# Patient Record
Sex: Female | Born: 1945 | Race: White | Hispanic: No | Marital: Married | Smoking: Former smoker
Health system: Southern US, Community
[De-identification: ages and names within clinical notes are randomized; demographics above are authoritative.]

## PROBLEM LIST (undated history)

## (undated) DIAGNOSIS — G20A1 Parkinson's disease without dyskinesia, without mention of fluctuations: Secondary | ICD-10-CM

## (undated) DIAGNOSIS — R1310 Dysphagia, unspecified: Secondary | ICD-10-CM

## (undated) DIAGNOSIS — G20 Parkinson's disease: Secondary | ICD-10-CM

## (undated) DIAGNOSIS — S92909A Unspecified fracture of unspecified foot, initial encounter for closed fracture: Secondary | ICD-10-CM

## (undated) DIAGNOSIS — T56891A Toxic effect of other metals, accidental (unintentional), initial encounter: Secondary | ICD-10-CM

## (undated) DIAGNOSIS — E0590 Thyrotoxicosis, unspecified without thyrotoxic crisis or storm: Secondary | ICD-10-CM

## (undated) DIAGNOSIS — N289 Disorder of kidney and ureter, unspecified: Secondary | ICD-10-CM

## (undated) DIAGNOSIS — M1990 Unspecified osteoarthritis, unspecified site: Secondary | ICD-10-CM

## (undated) DIAGNOSIS — K859 Acute pancreatitis without necrosis or infection, unspecified: Secondary | ICD-10-CM

## (undated) DIAGNOSIS — R296 Repeated falls: Secondary | ICD-10-CM

## (undated) DIAGNOSIS — R42 Dizziness and giddiness: Secondary | ICD-10-CM

## (undated) DIAGNOSIS — D649 Anemia, unspecified: Secondary | ICD-10-CM

## (undated) DIAGNOSIS — M436 Torticollis: Secondary | ICD-10-CM

## (undated) DIAGNOSIS — J449 Chronic obstructive pulmonary disease, unspecified: Secondary | ICD-10-CM

## (undated) DIAGNOSIS — R270 Ataxia, unspecified: Secondary | ICD-10-CM

## (undated) DIAGNOSIS — C50412 Malignant neoplasm of upper-outer quadrant of left female breast: Secondary | ICD-10-CM

## (undated) DIAGNOSIS — K2950 Unspecified chronic gastritis without bleeding: Secondary | ICD-10-CM

## (undated) DIAGNOSIS — F319 Bipolar disorder, unspecified: Secondary | ICD-10-CM

## (undated) DIAGNOSIS — K259 Gastric ulcer, unspecified as acute or chronic, without hemorrhage or perforation: Secondary | ICD-10-CM

## (undated) DIAGNOSIS — R413 Other amnesia: Secondary | ICD-10-CM

## (undated) DIAGNOSIS — D352 Benign neoplasm of pituitary gland: Secondary | ICD-10-CM

## (undated) DIAGNOSIS — C50419 Malignant neoplasm of upper-outer quadrant of unspecified female breast: Secondary | ICD-10-CM

## (undated) DIAGNOSIS — I10 Essential (primary) hypertension: Secondary | ICD-10-CM

## (undated) DIAGNOSIS — E785 Hyperlipidemia, unspecified: Secondary | ICD-10-CM

## (undated) DIAGNOSIS — Z853 Personal history of malignant neoplasm of breast: Principal | ICD-10-CM

## (undated) DIAGNOSIS — N189 Chronic kidney disease, unspecified: Secondary | ICD-10-CM

## (undated) DIAGNOSIS — N390 Urinary tract infection, site not specified: Secondary | ICD-10-CM

## (undated) DIAGNOSIS — H409 Unspecified glaucoma: Secondary | ICD-10-CM

## (undated) HISTORY — DX: Ataxia, unspecified: R27.0

## (undated) HISTORY — DX: Unspecified glaucoma: H40.9

## (undated) HISTORY — DX: Acute pancreatitis without necrosis or infection, unspecified: K85.90

## (undated) HISTORY — DX: Toxic effect of other metals, accidental (unintentional), initial encounter: T56.891A

## (undated) HISTORY — DX: Bipolar disorder, unspecified: F31.9

## (undated) HISTORY — PX: TUBAL LIGATION: SHX77

## (undated) HISTORY — DX: Chronic kidney disease, unspecified: N18.9

## (undated) HISTORY — PX: OTHER SURGICAL HISTORY: SHX169

## (undated) HISTORY — DX: Malignant neoplasm of upper-outer quadrant of unspecified female breast: C50.419

## (undated) HISTORY — PX: ERCP: SHX60

## (undated) HISTORY — DX: Parkinson's disease without dyskinesia, without mention of fluctuations: G20.A1

## (undated) HISTORY — DX: Personal history of malignant neoplasm of breast: Z85.3

## (undated) HISTORY — DX: Chronic obstructive pulmonary disease, unspecified: J44.9

## (undated) HISTORY — DX: Essential (primary) hypertension: I10

## (undated) HISTORY — DX: Unspecified fracture of unspecified foot, initial encounter for closed fracture: S92.909A

## (undated) HISTORY — PX: EUS: SHX5427

## (undated) HISTORY — PX: APPENDECTOMY: SHX54

## (undated) HISTORY — DX: Benign neoplasm of pituitary gland: D35.2

## (undated) HISTORY — DX: Parkinson's disease: G20

## (undated) HISTORY — DX: Unspecified osteoarthritis, unspecified site: M19.90

## (undated) HISTORY — PX: COLON SURGERY: SHX602

---
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
## 1999-11-06 HISTORY — PX: BREAST SURGERY: SHX581

## 1999-12-23 HISTORY — PX: BREAST EXCISIONAL BIOPSY: SUR124

## 2002-12-22 HISTORY — PX: COLONOSCOPY: SHX174

## 2004-10-28 ENCOUNTER — Ambulatory Visit: Payer: Self-pay | Admitting: Pain Medicine

## 2004-11-07 ENCOUNTER — Ambulatory Visit: Payer: Self-pay | Admitting: Pain Medicine

## 2004-11-18 ENCOUNTER — Ambulatory Visit: Payer: Self-pay | Admitting: Unknown Physician Specialty

## 2004-11-20 ENCOUNTER — Ambulatory Visit: Payer: Self-pay | Admitting: Pain Medicine

## 2004-12-22 HISTORY — PX: NECK SURGERY: SHX720

## 2005-03-20 ENCOUNTER — Ambulatory Visit: Payer: Self-pay | Admitting: Pain Medicine

## 2005-08-18 ENCOUNTER — Ambulatory Visit: Payer: Self-pay | Admitting: Pain Medicine

## 2005-08-29 ENCOUNTER — Ambulatory Visit: Payer: Self-pay | Admitting: Pain Medicine

## 2005-09-02 ENCOUNTER — Ambulatory Visit: Payer: Self-pay | Admitting: Pain Medicine

## 2005-09-08 ENCOUNTER — Ambulatory Visit: Payer: Self-pay | Admitting: Pain Medicine

## 2005-09-19 ENCOUNTER — Ambulatory Visit: Payer: Self-pay | Admitting: Pain Medicine

## 2005-10-06 ENCOUNTER — Ambulatory Visit: Payer: Self-pay | Admitting: Pain Medicine

## 2005-10-13 ENCOUNTER — Ambulatory Visit: Payer: Self-pay | Admitting: Pain Medicine

## 2005-10-15 ENCOUNTER — Ambulatory Visit: Payer: Self-pay | Admitting: Pain Medicine

## 2006-03-12 ENCOUNTER — Ambulatory Visit: Payer: Self-pay | Admitting: Internal Medicine

## 2006-03-12 ENCOUNTER — Ambulatory Visit: Payer: Self-pay

## 2006-03-12 ENCOUNTER — Ambulatory Visit: Payer: Self-pay | Admitting: Unknown Physician Specialty

## 2006-03-30 ENCOUNTER — Ambulatory Visit: Payer: Self-pay | Admitting: Internal Medicine

## 2006-03-31 ENCOUNTER — Ambulatory Visit: Payer: Self-pay | Admitting: Internal Medicine

## 2006-12-07 ENCOUNTER — Ambulatory Visit: Payer: Self-pay | Admitting: Pain Medicine

## 2006-12-22 HISTORY — PX: BREAST EXCISIONAL BIOPSY: SUR124

## 2007-01-06 ENCOUNTER — Ambulatory Visit: Payer: Self-pay | Admitting: Internal Medicine

## 2007-01-06 ENCOUNTER — Ambulatory Visit: Payer: Self-pay | Admitting: Unknown Physician Specialty

## 2007-04-15 ENCOUNTER — Ambulatory Visit: Payer: Self-pay | Admitting: Internal Medicine

## 2007-04-19 ENCOUNTER — Ambulatory Visit: Payer: Self-pay | Admitting: Internal Medicine

## 2007-04-29 ENCOUNTER — Other Ambulatory Visit: Payer: Self-pay

## 2007-04-29 ENCOUNTER — Ambulatory Visit: Payer: Self-pay | Admitting: General Surgery

## 2007-05-07 ENCOUNTER — Ambulatory Visit: Payer: Self-pay | Admitting: General Surgery

## 2007-05-07 DIAGNOSIS — C50419 Malignant neoplasm of upper-outer quadrant of unspecified female breast: Secondary | ICD-10-CM

## 2007-05-07 HISTORY — DX: Malignant neoplasm of upper-outer quadrant of unspecified female breast: C50.419

## 2007-05-07 HISTORY — PX: BREAST SURGERY: SHX581

## 2007-05-23 ENCOUNTER — Ambulatory Visit: Payer: Self-pay | Admitting: Radiation Oncology

## 2007-05-27 ENCOUNTER — Ambulatory Visit: Payer: Self-pay | Admitting: Radiation Oncology

## 2007-06-22 ENCOUNTER — Ambulatory Visit: Payer: Self-pay | Admitting: Radiation Oncology

## 2007-07-23 ENCOUNTER — Ambulatory Visit: Payer: Self-pay | Admitting: Radiation Oncology

## 2007-08-23 ENCOUNTER — Ambulatory Visit: Payer: Self-pay | Admitting: Radiation Oncology

## 2007-09-22 ENCOUNTER — Ambulatory Visit: Payer: Self-pay | Admitting: Radiation Oncology

## 2007-10-23 ENCOUNTER — Ambulatory Visit: Payer: Self-pay | Admitting: Radiation Oncology

## 2007-11-22 ENCOUNTER — Ambulatory Visit: Payer: Self-pay | Admitting: Radiation Oncology

## 2008-03-22 ENCOUNTER — Ambulatory Visit: Payer: Self-pay | Admitting: General Surgery

## 2008-03-22 ENCOUNTER — Ambulatory Visit: Payer: Self-pay | Admitting: Radiation Oncology

## 2008-04-12 ENCOUNTER — Ambulatory Visit: Payer: Self-pay | Admitting: Radiation Oncology

## 2008-04-21 ENCOUNTER — Ambulatory Visit: Payer: Self-pay | Admitting: Radiation Oncology

## 2008-05-31 DIAGNOSIS — C50411 Malignant neoplasm of upper-outer quadrant of right female breast: Secondary | ICD-10-CM | POA: Insufficient documentation

## 2008-09-21 ENCOUNTER — Ambulatory Visit: Payer: Self-pay | Admitting: Radiation Oncology

## 2008-10-02 ENCOUNTER — Ambulatory Visit: Payer: Self-pay | Admitting: General Surgery

## 2008-10-12 ENCOUNTER — Ambulatory Visit: Payer: Self-pay | Admitting: Radiation Oncology

## 2008-10-22 ENCOUNTER — Ambulatory Visit: Payer: Self-pay | Admitting: Radiation Oncology

## 2009-04-02 ENCOUNTER — Ambulatory Visit: Payer: Self-pay | Admitting: General Surgery

## 2009-09-21 ENCOUNTER — Ambulatory Visit: Payer: Self-pay | Admitting: Radiation Oncology

## 2009-10-12 ENCOUNTER — Ambulatory Visit: Payer: Self-pay | Admitting: Radiation Oncology

## 2009-10-22 ENCOUNTER — Ambulatory Visit: Payer: Self-pay | Admitting: Radiation Oncology

## 2009-11-05 ENCOUNTER — Ambulatory Visit: Payer: Self-pay | Admitting: General Surgery

## 2009-12-22 HISTORY — PX: BREAST BIOPSY: SHX20

## 2010-02-05 ENCOUNTER — Ambulatory Visit: Payer: Self-pay | Admitting: Internal Medicine

## 2010-02-11 ENCOUNTER — Ambulatory Visit: Payer: Self-pay | Admitting: Gastroenterology

## 2010-02-12 ENCOUNTER — Ambulatory Visit: Payer: Self-pay | Admitting: Gastroenterology

## 2010-02-18 ENCOUNTER — Ambulatory Visit: Payer: Self-pay | Admitting: Gastroenterology

## 2010-05-02 ENCOUNTER — Ambulatory Visit: Payer: Self-pay | Admitting: General Surgery

## 2010-05-07 ENCOUNTER — Ambulatory Visit: Payer: Self-pay | Admitting: General Surgery

## 2010-10-18 ENCOUNTER — Other Ambulatory Visit: Payer: Self-pay | Admitting: Unknown Physician Specialty

## 2010-10-30 ENCOUNTER — Ambulatory Visit: Payer: Self-pay | Admitting: General Surgery

## 2011-05-12 ENCOUNTER — Ambulatory Visit: Payer: Self-pay | Admitting: General Surgery

## 2011-09-19 ENCOUNTER — Other Ambulatory Visit: Payer: Self-pay | Admitting: Unknown Physician Specialty

## 2011-12-23 HISTORY — PX: EYE SURGERY: SHX253

## 2011-12-23 HISTORY — PX: BREAST SURGERY: SHX581

## 2011-12-23 HISTORY — PX: GLAUCOMA SURGERY: SHX656

## 2011-12-23 HISTORY — PX: OTHER SURGICAL HISTORY: SHX169

## 2012-01-14 ENCOUNTER — Ambulatory Visit: Payer: Self-pay | Admitting: Ophthalmology

## 2012-05-13 ENCOUNTER — Ambulatory Visit: Payer: Self-pay | Admitting: General Surgery

## 2012-05-14 ENCOUNTER — Ambulatory Visit: Payer: Self-pay | Admitting: General Surgery

## 2012-09-28 ENCOUNTER — Ambulatory Visit: Payer: Self-pay | Admitting: General Surgery

## 2012-10-07 ENCOUNTER — Other Ambulatory Visit: Payer: Self-pay | Admitting: Unknown Physician Specialty

## 2012-10-07 LAB — CREATININE, SERUM
Creatinine: 1.11 mg/dL (ref 0.60–1.30)
EGFR (Non-African Amer.): 52 — ABNORMAL LOW

## 2012-10-07 LAB — TSH: Thyroid Stimulating Horm: 2.8 u[IU]/mL

## 2012-10-07 LAB — LITHIUM LEVEL: Lithium: 0.9 mmol/L

## 2012-10-26 ENCOUNTER — Ambulatory Visit: Payer: Self-pay | Admitting: General Surgery

## 2012-10-26 HISTORY — PX: BREAST EXCISIONAL BIOPSY: SUR124

## 2013-03-17 ENCOUNTER — Encounter: Payer: Self-pay | Admitting: General Surgery

## 2013-05-17 ENCOUNTER — Ambulatory Visit: Payer: Self-pay | Admitting: General Surgery

## 2013-05-23 ENCOUNTER — Encounter: Payer: Self-pay | Admitting: General Surgery

## 2013-05-31 ENCOUNTER — Ambulatory Visit (INDEPENDENT_AMBULATORY_CARE_PROVIDER_SITE_OTHER): Payer: Medicare PPO | Admitting: General Surgery

## 2013-05-31 ENCOUNTER — Encounter: Payer: Self-pay | Admitting: General Surgery

## 2013-05-31 VITALS — BP 122/68 | HR 74 | Resp 12 | Ht 64.0 in | Wt 103.0 lb

## 2013-05-31 DIAGNOSIS — Z853 Personal history of malignant neoplasm of breast: Secondary | ICD-10-CM

## 2013-05-31 DIAGNOSIS — C50419 Malignant neoplasm of upper-outer quadrant of unspecified female breast: Secondary | ICD-10-CM

## 2013-05-31 DIAGNOSIS — C50411 Malignant neoplasm of upper-outer quadrant of right female breast: Secondary | ICD-10-CM

## 2013-05-31 NOTE — Patient Instructions (Addendum)
Patient to return in one yaer.

## 2013-05-31 NOTE — Progress Notes (Signed)
Patient ID: Elaine Clayton, female   DOB: 1946-09-07, 67 y.o.   MRN: [id number]  Chief Complaint  Patient presents with  . Other    mammogram    HPI Elaine Clayton is a 67 y.o. female here today for her follow up mammogram done at [facility] on 05/18/13 cat 2. Patient reports no breast problems and that she is doing well.  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 Patient underwent excision of a left breast papilloma in 2013.   HPI  Past Medical History  Diagnosis Date  . Glaucoma   . Hypertension   . Bipolar affective disorder   . Pancreatitis   . Pituitary microadenoma   . COPD (chronic obstructive pulmonary disease)   . Personal history of malignant neoplasm of breast   . Osteoarthritis     Past Surgical History  Procedure Laterality Date  . Colonoscopy  2004  . Neck surgery  2006  . Eye surgery  2013  . Appendectomy    . Tubal ligation    . Breast surgery Left 2013    left breast wide excision,Intraductal papilloma, ductal hyperplasia and sclerosing adenosis. Microcalcifications associated with columnar cell change. No evidence of atypia or malignancy. Margins are unremarkable.    Family History  Problem Relation Age of Onset  . Breast cancer Mother     Social History History  Substance Use Topics  . Smoking status: Former Smoker -- 1.00 packs/day for 35 years  . Smokeless tobacco: Never Used  . Alcohol Use: Yes    Allergies  Allergen Reactions  . Sulfa Antibiotics Rash  . Tetracyclines & Related Rash    Current Outpatient Prescriptions  Medication Sig Dispense Refill  . ALPRAZolam (XANAX) 0.5 MG tablet Take 0.5 mg by mouth at bedtime as needed for sleep.      Marland Kitchen buPROPion (WELLBUTRIN SR) 150 MG 12 hr tablet Take 150 mg by mouth 2 (two) times daily.      Marland Kitchen esomeprazole (NEXIUM) 40 MG capsule Take 40 mg by mouth daily before breakfast.      . lithium 300 MG tablet Take 300 mg by  mouth daily.      . metoCLOPramide (REGLAN) 5 MG tablet Take 5 mg by mouth 4 (four) times daily.      . promethazine (PHENERGAN) 25 MG tablet Take 25 mg by mouth every 6 (six) hours as needed for nausea.      . rosuvastatin (CRESTOR) 10 MG tablet Take 10 mg by mouth daily.       No current facility-administered medications for this visit.    Review of Systems Review of Systems  Constitutional: Negative.   Respiratory: Negative.   Cardiovascular: Negative.     Blood pressure 122/68, pulse 74, resp. rate 12, height 5\' 4"  (1.626 m), weight 103 lb (46.72 kg).  Physical Exam Physical Exam  Constitutional: She appears well-developed and well-nourished.  Eyes: Conjunctivae are normal. No scleral icterus.  Neck: Neck supple.  Cardiovascular: Normal rate, regular rhythm and normal heart sounds.   Pulmonary/Chest: Breath sounds normal. Right breast exhibits no inverted nipple, no mass, no nipple discharge, no skin change and no tenderness. Left breast exhibits no inverted nipple, no mass, no nipple discharge, no skin change and no tenderness.  Lymphadenopathy:    She has no cervical adenopathy.    She has no axillary adenopathy.    Data Reviewed Bilateral mammograms dated May 17, 2013 showed no  interval change.  BI-RAD-2.  Assessment    The patient is doing well.     Plan    Arrangements were made for followup examination with bilateral mammograms one year.        [name] 05/31/2013, 10:21 PM

## 2013-10-04 ENCOUNTER — Encounter: Payer: Self-pay | Admitting: Unknown Physician Specialty

## 2013-10-22 ENCOUNTER — Encounter: Payer: Self-pay | Admitting: Unknown Physician Specialty

## 2013-11-21 ENCOUNTER — Encounter: Payer: Self-pay | Admitting: Unknown Physician Specialty

## 2013-12-22 DIAGNOSIS — T56891A Toxic effect of other metals, accidental (unintentional), initial encounter: Secondary | ICD-10-CM

## 2013-12-22 DIAGNOSIS — S92909A Unspecified fracture of unspecified foot, initial encounter for closed fracture: Secondary | ICD-10-CM

## 2013-12-22 HISTORY — DX: Unspecified fracture of unspecified foot, initial encounter for closed fracture: S92.909A

## 2013-12-22 HISTORY — DX: Toxic effect of other metals, accidental (unintentional), initial encounter: T56.891A

## 2014-05-17 DIAGNOSIS — J449 Chronic obstructive pulmonary disease, unspecified: Secondary | ICD-10-CM | POA: Insufficient documentation

## 2014-06-20 ENCOUNTER — Ambulatory Visit: Payer: Self-pay | Admitting: General Surgery

## 2014-06-20 ENCOUNTER — Encounter: Payer: Self-pay | Admitting: General Surgery

## 2014-06-28 ENCOUNTER — Ambulatory Visit: Payer: Medicare PPO | Admitting: General Surgery

## 2014-06-28 DIAGNOSIS — N289 Disorder of kidney and ureter, unspecified: Secondary | ICD-10-CM | POA: Insufficient documentation

## 2014-06-28 DIAGNOSIS — F319 Bipolar disorder, unspecified: Secondary | ICD-10-CM | POA: Insufficient documentation

## 2014-06-28 DIAGNOSIS — E785 Hyperlipidemia, unspecified: Secondary | ICD-10-CM | POA: Insufficient documentation

## 2014-06-28 DIAGNOSIS — C50919 Malignant neoplasm of unspecified site of unspecified female breast: Secondary | ICD-10-CM | POA: Insufficient documentation

## 2014-06-28 DIAGNOSIS — M1990 Unspecified osteoarthritis, unspecified site: Secondary | ICD-10-CM | POA: Insufficient documentation

## 2014-06-28 DIAGNOSIS — K861 Other chronic pancreatitis: Secondary | ICD-10-CM | POA: Insufficient documentation

## 2014-06-28 DIAGNOSIS — I10 Essential (primary) hypertension: Secondary | ICD-10-CM | POA: Insufficient documentation

## 2014-06-28 DIAGNOSIS — M5030 Other cervical disc degeneration, unspecified cervical region: Secondary | ICD-10-CM | POA: Insufficient documentation

## 2014-06-30 ENCOUNTER — Encounter: Payer: Self-pay | Admitting: General Surgery

## 2014-07-03 ENCOUNTER — Ambulatory Visit: Payer: Medicare PPO | Admitting: General Surgery

## 2014-07-18 ENCOUNTER — Encounter: Payer: Self-pay | Admitting: General Surgery

## 2014-07-18 ENCOUNTER — Ambulatory Visit (INDEPENDENT_AMBULATORY_CARE_PROVIDER_SITE_OTHER): Payer: Medicare PPO | Admitting: General Surgery

## 2014-07-18 VITALS — BP 110/70 | HR 80 | Resp 12 | Ht 64.0 in | Wt 96.0 lb

## 2014-07-18 DIAGNOSIS — Z853 Personal history of malignant neoplasm of breast: Secondary | ICD-10-CM

## 2014-07-18 NOTE — Patient Instructions (Signed)
Patient to return in 1 year with bilateral screening mammogram. Continue self breast exams. Call office for any new breast issues or concerns.  

## 2014-07-18 NOTE — Progress Notes (Signed)
Patient ID: Elaine Clayton, female   DOB: 1946/10/28, 68 y.o.   MRN: [id number]  Chief Complaint  Patient presents with  . Follow-up    mammogram     HPI Elaine Clayton is a 68 y.o. female who presents for a breast evaluation. The most recent mammogram was done on 06/20/14. Patient does perform regular self breast checks and gets regular mammograms done. The patient denies any new problems at this time.    HPI  Past Medical History  Diagnosis Date  . Glaucoma   . Hypertension   . Bipolar affective disorder   . Pancreatitis   . Pituitary microadenoma   . COPD (chronic obstructive pulmonary disease)   . Personal history of malignant neoplasm of breast   . Osteoarthritis   . Broken foot 2015    right foot  . Lithium toxicity 2015  . 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 Colonoscopy  2004  . Neck surgery  2006  . Eye surgery  2013  . Appendectomy    . Tubal ligation    . 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  . Breast surgery Left  2013    left breast wide excision,Intraductal papilloma, ductal hyperplasia and sclerosing adenosis. Microcalcifications associated with columnar cell change. No evidence of atypia or malignancy. Margins are unremarkable.  . Breast surgery Right November 06, 1999    multiple areas of microcalcification showing evidence of sclerosing adenosis and ductal hyperplasia.  . Breast surgery Left May 07, 2007    wide excision.    Family History  Problem Relation Age of Onset  . Breast cancer Mother     Social History History  Substance Use Topics  . Smoking status: Former Smoker -- 1.00 packs/day for 35 years  . Smokeless tobacco: Never Used  . Alcohol Use: Yes    Allergies  Allergen Reactions  . Sulfa Antibiotics Rash  . Tetracyclines & Related Rash    Current Outpatient  Prescriptions  Medication Sig Dispense Refill  . ALPRAZolam (XANAX) 0.5 MG tablet Take 0.5 mg by mouth at bedtime as needed for sleep.      Marland Kitchen amLODipine (NORVASC) 5 MG tablet Take 5 mg by mouth daily.      . bimatoprost (LUMIGAN) 0.03 % ophthalmic solution Place 1 drop into both eyes at bedtime.      . Brimonidine Tartrate-Timolol (COMBIGAN OP) Apply 1 drop to eye 2 (two) times daily.      . brinzolamide (AZOPT) 1 % ophthalmic suspension Place 1 drop into the left eye at bedtime.      Marland Kitchen buPROPion (WELLBUTRIN SR) 150 MG 12 hr tablet Take 150 mg by mouth 2 (two) times daily.      . Calcium Carbonate-Vitamin D (CALCIUM + D PO) Take 1 tablet by mouth 3 (three) times daily.      . cloNIDine (CATAPRES) 0.1 MG tablet Take 0.1 mg by mouth 2 (two) times daily.      . Coenzyme Q10 (CO Q 10 PO) Take 1 tablet by mouth daily.      . divalproex (DEPAKOTE) 250 MG DR tablet Take 250 mg by mouth 2 (two) times daily.      Marland Kitchen HYDROcodone-acetaminophen (NORCO/VICODIN) 5-325 MG per tablet Take 1 tablet by mouth every 6 (six) hours as needed for  moderate pain.      . Omega-3 Fatty Acids (OMEGA 3 PO) Take 1 capsule by mouth daily.      . ondansetron (ZOFRAN) 4 MG tablet Take 4 mg by mouth every 8 (eight) hours as needed for nausea or vomiting.      . rosuvastatin (CRESTOR) 10 MG tablet Take 10 mg by mouth daily.       No current facility-administered medications for this visit.    Review of Systems Review of Systems  Constitutional: Negative.   Respiratory: Negative.   Cardiovascular: Negative.     Blood pressure 110/70, pulse 80, resp. rate 12, height 5\' 4"  (1.626 m), weight 96 lb (43.545 kg).  Physical Exam Physical Exam  Constitutional: She is oriented to person, place, and time. She appears well-developed and well-nourished.  Neck: Neck supple. No thyromegaly present.  Cardiovascular: Normal rate, regular rhythm and normal heart sounds.   No murmur heard. Pulmonary/Chest: Effort normal and breath  sounds normal. Right breast exhibits tenderness (upper outer quadrant at site of scar). Right breast exhibits no inverted nipple, no mass, no nipple discharge and no skin change. Left breast exhibits no inverted nipple, no mass, no nipple discharge, no skin change and no tenderness.    Lymphadenopathy:    She has no cervical adenopathy.    She has no axillary adenopathy.  Neurological: She is alert and oriented to person, place, and time.  Skin: Skin is warm and dry.    Data Reviewed Bilateral mammogram stated June 20, 2014 were independently reviewed. Scattered calcifications.  No interval change. BI-RAD-2.  Assessment    Benign breast exam now 7 years out from a very small invasive carcinoma.     Plan    Follow up examination with screening mammograms in one year.     PCP: Damaris Hippo 07/18/2014, 9:02 PM

## 2014-07-28 ENCOUNTER — Encounter: Payer: Self-pay | Admitting: General Surgery

## 2014-09-08 ENCOUNTER — Emergency Department: Payer: Self-pay | Admitting: Emergency Medicine

## 2014-09-08 LAB — COMPREHENSIVE METABOLIC PANEL
ALK PHOS: 81 U/L
Albumin: 3.8 g/dL (ref 3.4–5.0)
Anion Gap: 9 (ref 7–16)
BILIRUBIN TOTAL: 0.4 mg/dL (ref 0.2–1.0)
BUN: 23 mg/dL — AB (ref 7–18)
CALCIUM: 9.6 mg/dL (ref 8.5–10.1)
CO2: 27 mmol/L (ref 21–32)
Chloride: 105 mmol/L (ref 98–107)
Creatinine: 1.06 mg/dL (ref 0.60–1.30)
EGFR (Non-African Amer.): 55 — ABNORMAL LOW
GLUCOSE: 108 mg/dL — AB (ref 65–99)
OSMOLALITY: 285 (ref 275–301)
POTASSIUM: 4.2 mmol/L (ref 3.5–5.1)
SGOT(AST): 28 U/L (ref 15–37)
SGPT (ALT): 26 U/L
Sodium: 141 mmol/L (ref 136–145)
TOTAL PROTEIN: 7.4 g/dL (ref 6.4–8.2)

## 2014-09-08 LAB — CBC
HCT: 37 % (ref 35.0–47.0)
HGB: 12.5 g/dL (ref 12.0–16.0)
MCH: 32 pg (ref 26.0–34.0)
MCHC: 33.7 g/dL (ref 32.0–36.0)
MCV: 95 fL (ref 80–100)
Platelet: 167 10*3/uL (ref 150–440)
RBC: 3.9 10*6/uL (ref 3.80–5.20)
RDW: 13 % (ref 11.5–14.5)
WBC: 5.6 10*3/uL (ref 3.6–11.0)

## 2015-01-05 ENCOUNTER — Ambulatory Visit: Payer: Self-pay | Admitting: Internal Medicine

## 2015-04-10 NOTE — Op Note (Signed)
PATIENT NAME:  Elaine Clayton, Elaine Clayton MR#:  [id number] DATE OF BIRTH:  Jun 14, 1946  DATE OF PROCEDURE:  10/26/2012  PREOPERATIVE DIAGNOSIS: Abnormal left breast mammogram.   POSTOPERATIVE DIAGNOSIS: Abnormal left breast mammogram.   OPERATIVE PROCEDURE: Needle localization and biopsy of the left breast.   SURGEON: Hervey Ard, MD   ANESTHESIA: General by LMA, Marcaine 0.5% plain, 30 mL local infiltration.   ESTIMATED BLOOD LOSS: 5 mL.   CLINICAL NOTE: This 69 year old woman has a new cluster of microcalcifications in the left breast. She underwent needle localization by David Martinique, MD in Radiology. She is brought to the Operating Room for planned excision.   OPERATIVE NOTE: With the patient under general anesthesia, the area was prepped with Betadine solution and draped. Ultrasound was used to identify the tip of the needle where the calcifications were identified posterior to the tip. A curvilinear incision in the upper inner quadrant of the breast was made and carried down through the skin and subcutaneous tissue after the injection of local anesthetic. Hemostasis was with electrocautery. The thin layer of subcutaneous fat was divided and the needle-localizing wire brought into the field. A 3 cm diameter area was excised down and including part of the pectoralis fascia. Specimen radiograph was reported to show the calcifications. The breast tissue was mobilized off the underlying pectoralis fascia and approximated with interrupted 2-0 Vicryl figure-of-eight sutures. The adipose layer was treated in a similar fashion. The skin was closed with running 4-0 Vicryl subcuticular suture. Benzoin and Steri-Strips were applied followed by a Telfa pad. Fluff gauze, Kerlix, and Ace wrap was then applied. The patient tolerated the procedure well and was taken to the recovery room in stable condition.  ____________________________ Robert Bellow, MD jwb:cbb D: 10/26/2012 15:49:13 ET T: 10/26/2012  17:52:29 ET JOB#: [id number] cc: Leonie Douglas. Doy Hutching, MD Tyan Lasure Amedeo Kinsman MD ELECTRONICALLY SIGNED 10/28/2012 9:34

## 2015-05-14 ENCOUNTER — Encounter: Payer: Self-pay | Admitting: *Deleted

## 2015-05-14 ENCOUNTER — Ambulatory Visit: Payer: Medicare PPO | Admitting: Anesthesiology

## 2015-05-14 ENCOUNTER — Ambulatory Visit
Admission: RE | Admit: 2015-05-14 | Discharge: 2015-05-14 | Disposition: A | Discharge status: Home / Self Care | Payer: Medicare PPO | Source: Ambulatory Visit | Attending: Unknown Physician Specialty | Admitting: Unknown Physician Specialty

## 2015-05-14 ENCOUNTER — Encounter
Admission: RE | Disposition: A | Discharge status: Home / Self Care | Payer: Self-pay | Source: Ambulatory Visit | Attending: Unknown Physician Specialty

## 2015-05-14 DIAGNOSIS — Z853 Personal history of malignant neoplasm of breast: Secondary | ICD-10-CM | POA: Diagnosis not present

## 2015-05-14 DIAGNOSIS — Z881 Allergy status to other antibiotic agents status: Secondary | ICD-10-CM | POA: Insufficient documentation

## 2015-05-14 DIAGNOSIS — I10 Essential (primary) hypertension: Secondary | ICD-10-CM | POA: Diagnosis not present

## 2015-05-14 DIAGNOSIS — Z79891 Long term (current) use of opiate analgesic: Secondary | ICD-10-CM | POA: Insufficient documentation

## 2015-05-14 DIAGNOSIS — K640 First degree hemorrhoids: Secondary | ICD-10-CM | POA: Diagnosis not present

## 2015-05-14 DIAGNOSIS — R1310 Dysphagia, unspecified: Secondary | ICD-10-CM | POA: Insufficient documentation

## 2015-05-14 DIAGNOSIS — K621 Rectal polyp: Secondary | ICD-10-CM | POA: Diagnosis not present

## 2015-05-14 DIAGNOSIS — H409 Unspecified glaucoma: Secondary | ICD-10-CM | POA: Insufficient documentation

## 2015-05-14 DIAGNOSIS — K6389 Other specified diseases of intestine: Secondary | ICD-10-CM | POA: Diagnosis not present

## 2015-05-14 DIAGNOSIS — J449 Chronic obstructive pulmonary disease, unspecified: Secondary | ICD-10-CM | POA: Diagnosis not present

## 2015-05-14 DIAGNOSIS — F319 Bipolar disorder, unspecified: Secondary | ICD-10-CM | POA: Insufficient documentation

## 2015-05-14 DIAGNOSIS — Z87891 Personal history of nicotine dependence: Secondary | ICD-10-CM | POA: Diagnosis not present

## 2015-05-14 DIAGNOSIS — Z86010 Personal history of colonic polyps: Secondary | ICD-10-CM | POA: Insufficient documentation

## 2015-05-14 DIAGNOSIS — Z882 Allergy status to sulfonamides status: Secondary | ICD-10-CM | POA: Diagnosis not present

## 2015-05-14 DIAGNOSIS — Z9889 Other specified postprocedural states: Secondary | ICD-10-CM | POA: Diagnosis not present

## 2015-05-14 DIAGNOSIS — K861 Other chronic pancreatitis: Secondary | ICD-10-CM | POA: Insufficient documentation

## 2015-05-14 DIAGNOSIS — Z79899 Other long term (current) drug therapy: Secondary | ICD-10-CM | POA: Diagnosis not present

## 2015-05-14 DIAGNOSIS — M1990 Unspecified osteoarthritis, unspecified site: Secondary | ICD-10-CM | POA: Diagnosis not present

## 2015-05-14 DIAGNOSIS — R197 Diarrhea, unspecified: Secondary | ICD-10-CM | POA: Diagnosis not present

## 2015-05-14 DIAGNOSIS — K2950 Unspecified chronic gastritis without bleeding: Secondary | ICD-10-CM | POA: Diagnosis not present

## 2015-05-14 HISTORY — PX: ESOPHAGOGASTRODUODENOSCOPY: SHX5428

## 2015-05-14 HISTORY — PX: COLONOSCOPY: SHX5424

## 2015-05-14 HISTORY — PX: SAVORY DILATION: SHX5439

## 2015-05-14 SURGERY — COLONOSCOPY
Anesthesia: General

## 2015-05-14 MED ORDER — MIDAZOLAM HCL 5 MG/5ML IJ SOLN
INTRAMUSCULAR | Status: DC | PRN
  Administered 2015-05-14: 1 mg via INTRAVENOUS

## 2015-05-14 MED ORDER — LIDOCAINE HCL (PF) 2 % IJ SOLN
INTRAMUSCULAR | Status: DC | PRN
  Administered 2015-05-14: 50 mg

## 2015-05-14 MED ORDER — SODIUM CHLORIDE 0.9 % IV SOLN
INTRAVENOUS | Status: DC
Start: 2015-05-14 — End: 2015-05-14

## 2015-05-14 MED ORDER — PROPOFOL 10 MG/ML IV BOLUS
INTRAVENOUS | Status: DC | PRN
  Administered 2015-05-14: 30 mg via INTRAVENOUS

## 2015-05-14 MED ORDER — SODIUM CHLORIDE 0.9 % IV SOLN
10000.0000 ug | INTRAVENOUS | Status: DC | PRN
  Administered 2015-05-14: 100 ug via INTRAVENOUS
  Administered 2015-05-14 (×2): 50 ug via INTRAVENOUS
  Administered 2015-05-14 (×4): 100 ug via INTRAVENOUS

## 2015-05-14 MED ORDER — PROPOFOL INFUSION 10 MG/ML OPTIME
INTRAVENOUS | Status: DC | PRN
  Administered 2015-05-14: 140 ug/kg/min via INTRAVENOUS

## 2015-05-14 MED ORDER — SODIUM CHLORIDE 0.9 % IV SOLN
INTRAVENOUS | Status: DC
  Administered 2015-05-14: 1000 mL via INTRAVENOUS

## 2015-05-14 MED ORDER — ESMOLOL HCL 10 MG/ML IV SOLN
INTRAVENOUS | Status: DC | PRN
  Administered 2015-05-14: 20 ug via INTRAVENOUS

## 2015-05-14 MED ORDER — FENTANYL CITRATE (PF) 100 MCG/2ML IJ SOLN
INTRAMUSCULAR | Status: DC | PRN
  Administered 2015-05-14: 50 ug via INTRAVENOUS

## 2015-05-14 NOTE — Transfer of Care (Signed)
Immediate Anesthesia Transfer of Care Note  Patient: Elaine Clayton  Procedure(s) Performed: Procedure(s): COLONOSCOPY (N/A) ESOPHAGOGASTRODUODENOSCOPY (EGD) (N/A) SAVORY DILATION (N/A)  Patient Location: PACU  Anesthesia Type:General  Level of Consciousness: sedated  Airway & Oxygen Therapy: Patient Spontanous Breathing and Patient connected to nasal cannula oxygen  Post-op Assessment: Report given to RN and Post -op Vital signs reviewed and stable  Post vital signs: Reviewed and stable  Last Vitals:  Filed Vitals:   05/14/15 1155  BP:   Pulse:   Temp: 35.8 C  Resp:     Complications: No apparent anesthesia complications

## 2015-05-14 NOTE — Op Note (Signed)
[facility] Gastroenterology Patient Name: Elaine Clayton Procedure Date: 05/14/2015 10:35 AM MRN: [id number] Account #: [id number] Date of Birth: 1946-02-11 Admit Type: Outpatient Age: 69 Room: [facility] ENDO ROOM 1 Gender: Female Note Status: Finalized Procedure:         Colonoscopy Indications:       Clinically significant diarrhea of unexplained origin Providers:         Manya Silvas, MD Referring MD:      Leonie Douglas. Doy Hutching, MD (Referring MD) Medicines:         Propofol per Anesthesia Complications:     No immediate complications. Procedure:         Pre-Anesthesia Assessment:                    - After reviewing the risks and benefits, the patient was                     deemed in satisfactory condition to undergo the procedure.                    After obtaining informed consent, the colonoscope was                     passed under direct vision. Throughout the procedure, the                     patient's blood pressure, pulse, and oxygen saturations                     were monitored continuously. The Colonoscope was                     introduced through the anus and advanced to the the cecum,                     identified by appendiceal orifice and ileocecal valve. The                     colonoscopy was performed without difficulty. The patient                     tolerated the procedure well. The quality of the bowel                     preparation was adequate to identify polyps. Findings:      A diminutive polyp was found in the rectum. The polyp was sessile. The       polyp was removed with a cold biopsy forceps. Resection and retrieval       were complete.      Internal hemorrhoids were found during endoscopy. The hemorrhoids were       medium-sized and Grade I (internal hemorrhoids that do not prolapse).      A 20 mm polyp was found at the ileocecal valve. The polyp was mostly       flat and sessile. Part of it was somewhat deep into the ilecocecal  valve       and appears beyond my expertise to remove and concern about effects on       tehe empttying of tthe small bowel from edema on inflammation possibly       due to attempt to remove it.      Bx done at 20cm for chronic diarrhea. Impression:        -  One diminutive polyp in the rectum. Resected and                     retrieved.                    - Internal hemorrhoids.                    - One 20 mm polyp at the ileocecal valve. Recommendation:    - Await pathology results. Manya Silvas, MD 05/14/2015 11:51:02 AM This report has been signed electronically. Number of Addenda: 0 Note Initiated On: 05/14/2015 10:35 AM Scope Withdrawal Time: 0 hours 19 minutes 20 seconds  Total Procedure Duration: 0 hours 26 minutes 42 seconds       [facility]

## 2015-05-14 NOTE — H&P (Signed)
Primary Care Physician:  Idelle Crouch, MD Primary Gastroenterologist:  Dr. Vira Agar  Pre-Procedure History & Physical: HPI:  Elaine Clayton is a 69 y.o. female is here for an endoscopy and colonoscopy.   Past Medical History  Diagnosis Date  . Glaucoma   . Hypertension   . Bipolar affective disorder   . Pancreatitis   . Pituitary microadenoma   . COPD (chronic obstructive pulmonary disease)   . Personal history of malignant neoplasm of breast   . Osteoarthritis   . Broken foot 2015    right foot  . Lithium toxicity 2015  . 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 Colonoscopy  2004  . Neck surgery  2006  . Eye surgery  2013  . Appendectomy    . Tubal ligation    . 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  . Breast surgery Left  2013    left breast wide excision,Intraductal papilloma, ductal hyperplasia and sclerosing adenosis. Microcalcifications associated with columnar cell change. No evidence of atypia or malignancy. Margins are unremarkable.  . Breast surgery Right November 06, 1999    multiple areas of microcalcification showing evidence of sclerosing adenosis and ductal hyperplasia.  . Breast surgery Left May 07, 2007    wide excision.    Prior to Admission medications   Medication Sig Start Date End Date Taking? Authorizing Provider  acetaminophen (TYLENOL) 500 MG tablet Take 500 mg by mouth every 6 (six) hours as needed for moderate pain.   Yes Historical Provider, MD  ALPRAZolam Duanne Moron) 0.5 MG tablet Take 0.5 mg by mouth at bedtime as needed for sleep.   Yes Historical Provider, MD  amLODipine (NORVASC) 5 MG tablet Take 5 mg by mouth daily.   Yes Historical Provider, MD  bimatoprost (LUMIGAN) 0.03 % ophthalmic solution Place 1 drop into both eyes at bedtime.   Yes Historical Provider, MD  Brimonidine  Tartrate-Timolol (COMBIGAN OP) Apply 1 drop to eye 2 (two) times daily.   Yes Historical Provider, MD  brinzolamide (AZOPT) 1 % ophthalmic suspension Place 1 drop into the left eye 2 (two) times daily.    Yes Historical Provider, MD  buPROPion (WELLBUTRIN SR) 150 MG 12 hr tablet Take 150 mg by mouth 2 (two) times daily.   Yes Historical Provider, MD  Calcium Carbonate-Vitamin D (CALCIUM + D PO) Take 1 tablet by mouth 3 (three) times daily.   Yes Historical Provider, MD  Coenzyme Q10 (CO Q 10 PO) Take 1 tablet by mouth daily.   Yes Historical Provider, MD  HYDROcodone-acetaminophen (NORCO/VICODIN) 5-325 MG per tablet Take 1 tablet by mouth every 6 (six) hours as needed for moderate pain.   Yes Historical Provider, MD  ondansetron (ZOFRAN) 8 MG tablet Take 8 mg by mouth as needed for nausea or vomiting.   Yes Historical Provider, MD  rosuvastatin (CRESTOR) 10 MG tablet Take 10 mg by mouth daily.   Yes Historical Provider, MD    Allergies as of 04/24/2015 - Review Complete 07/18/2014  Allergen Reaction Noted  . Sulfa antibiotics Rash 05/31/2013  . Tetracyclines & related Rash 05/31/2013    Family History  Problem Relation Age of Onset  . Breast cancer Mother     History   Social History  . Marital Status: Married    Spouse Name:  N/A  . Number of Children: N/A  . Years of Education: N/A   Occupational History  . Not on file.   Social History Main Topics  . Smoking status: Former Smoker -- 1.00 packs/day for 35 years  . Smokeless tobacco: Never Used  . Alcohol Use: Yes  . Drug Use: No  . Sexual Activity: Not on file   Other Topics Concern  . Not on file   Social History Narrative    Review of Systems: See HPI, otherwise negative ROS  Physical Exam: BP 159/78 mmHg  Pulse 98  Temp(Src) 98.4 F (36.9 C) (Oral)  Resp 17  Ht 5\' 4"  (1.626 m)  Wt 46.267 kg (102 lb)  BMI 17.50 kg/m2  SpO2 100% General:   Alert,  pleasant and cooperative in NAD Head:  Normocephalic and  atraumatic. Neck:  Supple; no masses or thyromegaly. Lungs:  Clear throughout to auscultation.    Heart:  Regular rate and rhythm. Abdomen:  Soft, nontender and nondistended. Normal bowel sounds, without guarding, and without rebound.   Neurologic:  Alert and  oriented x4;  grossly normal neurologically.  Impression/Plan: Elaine Clayton is here for an endoscopy and colonoscopy to be performed for screening and dysphagia  Risks, benefits, limitations, and alternatives regarding  endoscopy and colonoscopy have been reviewed with the patient.  Questions have been answered.  All parties agreeable.   Gaylyn Cheers, MD  05/14/2015, 10:53 AM

## 2015-05-14 NOTE — Anesthesia Preprocedure Evaluation (Signed)
Anesthesia Evaluation  Patient identified by MRN, date of birth, ID band Patient awake    Reviewed: Allergy & Precautions, H&P , NPO status , Patient's Chart, lab work & pertinent test results, reviewed documented beta blocker date and time   Airway Mallampati: II  TM Distance: >3 FB Neck ROM: full    Dental no notable dental hx.    Pulmonary neg pulmonary ROS, COPDformer smoker,  breath sounds clear to auscultation  Pulmonary exam normal       Cardiovascular Exercise Tolerance: Good hypertension, negative cardio ROS  Rhythm:regular Rate:Normal     Neuro/Psych PSYCHIATRIC DISORDERS negative neurological ROS  negative psych ROS   GI/Hepatic negative GI ROS, Neg liver ROS,   Endo/Other  negative endocrine ROS  Renal/GU negative Renal ROS  negative genitourinary   Musculoskeletal   Abdominal   Peds  Hematology negative hematology ROS (+)   Anesthesia Other Findings   Reproductive/Obstetrics negative OB ROS                             Anesthesia Physical Anesthesia Plan  ASA: III  Anesthesia Plan: General   Post-op Pain Management:    Induction:   Airway Management Planned:   Additional Equipment:   Intra-op Plan:   Post-operative Plan:   Informed Consent: I have reviewed the patients History and Physical, chart, labs and discussed the procedure including the risks, benefits and alternatives for the proposed anesthesia with the patient or authorized representative who has indicated his/her understanding and acceptance.   Dental Advisory Given  Plan Discussed with: CRNA  Anesthesia Plan Comments:         Anesthesia Quick Evaluation

## 2015-05-14 NOTE — Op Note (Signed)
[facility] Gastroenterology Patient Name: Elaine Clayton Procedure Date: 05/14/2015 10:35 AM MRN: [id number] Account #: [id number] Date of Birth: 26-Mar-1946 Admit Type: Outpatient Age: 69 Room: [facility] ENDO ROOM 1 Gender: Female Note Status: Finalized Procedure:         Upper GI endoscopy Indications:       Dysphagia Providers:         Manya Silvas, MD Referring MD:      Leonie Douglas. Doy Hutching, MD (Referring MD) Medicines:         Propofol per Anesthesia Complications:     No immediate complications. Procedure:         Pre-Anesthesia Assessment:                    - After reviewing the risks and benefits, the patient was                     deemed in satisfactory condition to undergo the procedure.                    After obtaining informed consent, the endoscope was passed                     under direct vision. Throughout the procedure, the                     patient's blood pressure, pulse, and oxygen saturations                     were monitored continuously. The Endoscope was introduced                     through the mouth, and advanced to the second part of                     duodenum. The upper GI endoscopy was accomplished without                     difficulty. The patient tolerated the procedure well. Findings:      The examined esophagus was normal. A guidewire was placed and the scope       was withdrawn. Dilation was performed with a Savary dilator with mild       resistance at 15 mm, 16 mm and 17 mm.      Patchy mild inflammation characterized by erythema and granularity was       found in the gastric antrum. Biopsies were taken with a cold forceps for       histology. Biopsies were taken with a cold forceps for Helicobacter       pylori testing.      There were patches in the stomach showing intesinal metaplasia and these       were photographed and biopsied.      The examined duodenum was normal. Impression:        - Normal esophagus.  Dilated.                    - Gastritis. Biopsied.                    - Normal examined duodenum. Recommendation:    - Await pathology results. Manya Silvas, MD 05/14/2015 11:12:04 AM This report has been signed electronically. Number of Addenda: 0 Note Initiated On: 05/14/2015 10:35 AM  [facility]

## 2015-05-14 NOTE — Anesthesia Postprocedure Evaluation (Signed)
  Anesthesia Post-op Note  Patient: Elaine Clayton  Procedure(s) Performed: Procedure(s): COLONOSCOPY (N/A) ESOPHAGOGASTRODUODENOSCOPY (EGD) (N/A) SAVORY DILATION (N/A)  Anesthesia type:General  Patient location: PACU  Post pain: Pain level controlled  Post assessment: Post-op Vital signs reviewed, Patient's Cardiovascular Status Stable, Respiratory Function Stable, Patent Airway and No signs of Nausea or vomiting  Post vital signs: Reviewed and stable  Last Vitals:  Filed Vitals:   05/14/15 1155  BP:   Pulse:   Temp: 35.8 C  Resp:     Level of consciousness: awake, alert  and patient cooperative  Complications: No apparent anesthesia complications

## 2015-05-15 ENCOUNTER — Encounter: Payer: Self-pay | Admitting: Unknown Physician Specialty

## 2015-05-15 LAB — SURGICAL PATHOLOGY

## 2015-05-17 ENCOUNTER — Other Ambulatory Visit: Payer: Self-pay

## 2015-05-17 DIAGNOSIS — Z1231 Encounter for screening mammogram for malignant neoplasm of breast: Secondary | ICD-10-CM

## 2015-05-24 ENCOUNTER — Ambulatory Visit (INDEPENDENT_AMBULATORY_CARE_PROVIDER_SITE_OTHER): Payer: Medicare PPO | Admitting: General Surgery

## 2015-05-24 ENCOUNTER — Encounter: Payer: Self-pay | Admitting: General Surgery

## 2015-05-24 VITALS — BP 132/72 | HR 80 | Resp 14 | Ht 64.5 in | Wt 101.0 lb

## 2015-05-24 DIAGNOSIS — K635 Polyp of colon: Secondary | ICD-10-CM

## 2015-05-24 MED ORDER — METRONIDAZOLE 500 MG PO TABS: 500.0000 mg | ORAL_TABLET | ORAL | Status: AC

## 2015-05-24 MED ORDER — NEOMYCIN SULFATE 500 MG PO TABS: 1000.0000 mg | ORAL_TABLET | ORAL | Status: AC

## 2015-05-24 NOTE — Progress Notes (Signed)
Patient ID: Elaine Clayton, female   DOB: Dec 05, 1946, 69 y.o.   MRN: [id number]  Chief Complaint  Patient presents with  . Colon Polyps    HPI Elaine Clayton is a 69 y.o. female. Here today to discuss colon polyps. Dr Vira Agar completed a colonoscopy on 05-14-15 and the polyp was in a position that he cold not remove. She denies blood in the stool. She does admit to diarrhea and constipation at every 3 day intervals, which she describes as normal for her for many years. She utilizes imodium and fiber wafers.   HPI  Past Medical History  Diagnosis Date  . Glaucoma   . Hypertension   . Bipolar affective disorder   . Pancreatitis   . Pituitary microadenoma   . COPD (chronic obstructive pulmonary disease)   . Personal history of malignant neoplasm of breast   . Osteoarthritis   . Broken foot 2015    right foot  . Lithium toxicity 2015  . 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 Chronic kidney disease     Dr Holley Raring    Past Surgical History  Procedure Laterality Date  . Colonoscopy  2004  . Neck surgery  2006  . Eye surgery  2013  . Appendectomy    . Tubal ligation    . 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  . Breast surgery Left  2013    left breast wide excision,Intraductal papilloma, ductal hyperplasia and sclerosing adenosis. Microcalcifications associated with columnar cell change. No evidence of atypia or malignancy. Margins are unremarkable.  . Breast surgery Right November 06, 1999    multiple areas of microcalcification showing evidence of sclerosing adenosis and ductal hyperplasia.  . Breast surgery Left May 07, 2007    wide excision.  . Colonoscopy N/A 05/14/2015    Procedure: COLONOSCOPY;  Surgeon: Manya Silvas, MD;  Location: [facility] ENDOSCOPY;  Service: Endoscopy;  Laterality: N/A;  . Esophagogastroduodenoscopy N/A 05/14/2015    Procedure:  ESOPHAGOGASTRODUODENOSCOPY (EGD);  Surgeon: Manya Silvas, MD;  Location: [facility] ENDOSCOPY;  Service: Endoscopy;  Laterality: N/A;  . Savory dilation N/A 05/14/2015    Procedure: SAVORY DILATION;  Surgeon: Manya Silvas, MD;  Location: [facility] ENDOSCOPY;  Service: Endoscopy;  Laterality: N/A;    Family History  Problem Relation Age of Onset  . Breast cancer Mother     Social History History  Substance Use Topics  . Smoking status: Former Smoker -- 1.00 packs/day for 35 years  . Smokeless tobacco: Never Used  . Alcohol Use: Yes    Allergies  Allergen Reactions  . Influenza Vaccines Anaphylaxis  . Sulfa Antibiotics Rash  . Tetracyclines & Related Rash    Current Outpatient Prescriptions  Medication Sig Dispense Refill  . acetaminophen (TYLENOL) 500 MG tablet Take 500 mg by mouth every 6 (six) hours as needed for moderate pain.    . Aclidinium Bromide 400 MCG/ACT AEPB Inhale into the lungs.    . ALPRAZolam (XANAX) 0.5 MG tablet Take 0.5 mg by mouth at bedtime as needed for sleep.    Marland Kitchen amLODipine (NORVASC) 5 MG tablet Take 5 mg by mouth daily.    . bimatoprost (LUMIGAN) 0.03 % ophthalmic solution Place 1 drop into both eyes at bedtime.    . Brimonidine Tartrate-Timolol (COMBIGAN OP) Apply 1 drop to eye 2 (two) times daily.    Marland Kitchen  brinzolamide (AZOPT) 1 % ophthalmic suspension Place 1 drop into the left eye 2 (two) times daily.     Marland Kitchen buPROPion (WELLBUTRIN SR) 150 MG 12 hr tablet Take 150 mg by mouth 2 (two) times daily.    . Calcium Carbonate-Vitamin D (CALCIUM + D PO) Take 1 tablet by mouth 3 (three) times daily.    . Coenzyme Q10 (CO Q 10 PO) Take 1 tablet by mouth daily.    Marland Kitchen esomeprazole (NEXIUM) 40 MG capsule Take by mouth.    [facility]) 0.045-0.015 MG/DAY Place onto the skin.    Marland Kitchen HYDROcodone-acetaminophen (NORCO/VICODIN) 5-325 MG per tablet Take 1 tablet by mouth every 6 (six) hours as needed for moderate pain.    Marland Kitchen ipratropium (ATROVENT) 0.06 %  nasal spray Place into the nose.    . loperamide (IMODIUM) 2 MG capsule Take by mouth as needed for diarrhea or loose stools.    . ondansetron (ZOFRAN) 8 MG tablet Take 8 mg by mouth as needed for nausea or vomiting.    . rosuvastatin (CRESTOR) 10 MG tablet Take 10 mg by mouth daily.    . metroNIDAZOLE (FLAGYL) 500 MG tablet Take 1 tablet (500 mg total) by mouth See admin instructions. Take one (1) tablet at 6 PM and one (1) tablet at 11 PM the night prior to surgery. 2 tablet 0  . neomycin (MYCIFRADIN) 500 MG tablet Take 2 tablets (1,000 mg total) by mouth See admin instructions. Take two (2) tablets at 6 PM and two (2) tablets at 11 PM the night prior to surgery. 4 tablet 0   No current facility-administered medications for this visit.    Review of Systems Review of Systems  Constitutional: Negative.   Respiratory: Positive for shortness of breath.   Cardiovascular: Negative.   Gastrointestinal: Positive for diarrhea and constipation. Negative for blood in stool.    Blood pressure 132/72, pulse 80, resp. rate 14, height 5' 4.5" (1.638 m), weight 101 lb (45.813 kg).  Physical Exam Physical Exam  Constitutional: She is oriented to person, place, and time. She appears well-developed and well-nourished.  Neck: Neck supple.  Cardiovascular: Normal rate, regular rhythm and normal heart sounds.   Pulmonary/Chest: Effort normal and breath sounds normal.  Abdominal: Soft. Normal appearance and bowel sounds are normal. There is no tenderness.  Lymphadenopathy:    She has no cervical adenopathy.  Neurological: She is alert and oriented to person, place, and time.  Skin: Skin is warm and dry.    Data Reviewed 05/14/2015 colonoscopy images reviewed. Polyp at the ileocecal valve extending visually to within the valve orifice. Biopsies were not completed as it was anticipated she would be a candidate for endoscopic ultrasound resection.  Assessment    20 mm polyp of the ileocecal  valve.  History of diarrhea alternating with constipation secondary to narcotic use for diarrhea.  History of low weight and difficult weight gain.      Plan    Options for management were reviewed. Considering the location it's unlikely this can be completely resected endoscopically. The appearance is that of a serrated adenoma or more likely a sessile tubular adenoma with low malignant potential. Observation could be undertaken versus surgical resection. With her history of diarrhea we'll try to minimize loss of the terminal ileum and right colon, recognizing that if an occult malignancy is identified formal right colectomy might be required. As she is re: having episodes of 4-6 stools per day, loss of the TI and right colon  could produce profound diarrhea.    Make use of daily fiber and less imodium to minimize rebound constipation. Discussed risk and benefits of surgery.  Patient is scheduled for surgery at [facility] on 06/15/15. She will pre admit at the hospital on 06/05/15 at 11:00 am. Prescriptions for pre surgery antibiotics have been called into the patients pharmacy. Patient is aware of date and instructions.   PCP:  Damaris Hippo 05/25/2015, 12:34 PM

## 2015-05-24 NOTE — Patient Instructions (Addendum)
The patient is aware to call back for any questions or concerns. Make use of daily fiber and less imodium.  Patient is scheduled for surgery at [facility] on 06/15/15. She will pre admit at the hospital on 06/05/15 at 11:00 am. Prescriptions for pre surgery antibiotics have been called into the patients pharmacy. Patient is aware of date and instructions.

## 2015-05-25 ENCOUNTER — Other Ambulatory Visit: Payer: Self-pay | Admitting: General Surgery

## 2015-05-25 DIAGNOSIS — Z86010 Personal history of colonic polyps: Secondary | ICD-10-CM

## 2015-05-25 DIAGNOSIS — K635 Polyp of colon: Secondary | ICD-10-CM | POA: Insufficient documentation

## 2015-05-25 NOTE — H&P (Signed)
Patient ID: Elaine Clayton, female DOB: 1946/04/02, 70 y.o. MRN: [id number]  Chief Complaint   Patient presents with   .  Colon Polyps    HPI  Elaine Clayton is a 69 y.o. female. Here today to discuss colon polyps. Dr Vira Agar completed a colonoscopy on 05-14-15 and the polyp was in a position that he cold not remove.  She denies blood in the stool. She does admit to diarrhea and constipation at every 3 day intervals, which she describes as normal for her for many years. She utilizes imodium and fiber wafers.  HPI  Past Medical History   Diagnosis  Date   .  Glaucoma    .  Hypertension    .  Bipolar affective disorder    .  Pancreatitis    .  Pituitary microadenoma    .  COPD (chronic obstructive pulmonary disease)    .  Personal history of malignant neoplasm of breast    .  Osteoarthritis    .  Broken foot  2015     right foot   .  Lithium toxicity  2015   .  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  Chronic kidney disease      Dr Holley Raring    Past Surgical History   Procedure  Laterality  Date   .  Colonoscopy   2004   .  Neck surgery   2006   .  Eye surgery   2013   .  Appendectomy     .  Tubal ligation     .  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   .  Breast surgery  Left  2013     left breast wide excision,Intraductal papilloma, ductal hyperplasia and sclerosing adenosis. Microcalcifications associated with columnar cell change. No evidence of atypia or malignancy. Margins are unremarkable.   .  Breast surgery  Right  November 06, 1999     multiple areas of microcalcification showing evidence of sclerosing adenosis and ductal hyperplasia.   .  Breast surgery  Left  May 07, 2007     wide excision.   .  Colonoscopy  N/A  05/14/2015     Procedure: COLONOSCOPY; Surgeon: Manya Silvas, MD; Location: [facility] ENDOSCOPY; Service: Endoscopy; Laterality: N/A;   .   Esophagogastroduodenoscopy  N/A  05/14/2015     Procedure: ESOPHAGOGASTRODUODENOSCOPY (EGD); Surgeon: Manya Silvas, MD; Location: [facility] ENDOSCOPY; Service: Endoscopy; Laterality: N/A;   .  Savory dilation  N/A  05/14/2015     Procedure: SAVORY DILATION; Surgeon: Manya Silvas, MD; Location: [facility] ENDOSCOPY; Service: Endoscopy; Laterality: N/A;    Family History   Problem  Relation  Age of Onset   .  Breast cancer  Mother     Social History  History   Substance Use Topics   .  Smoking status:  Former Smoker -- 1.00 packs/day for 35 years   .  Smokeless tobacco:  Never Used   .  Alcohol Use:  Yes    Allergies   Allergen  Reactions   .  Influenza Vaccines  Anaphylaxis   .  Sulfa Antibiotics  Rash   .  Tetracyclines & Related  Rash    Current Outpatient Prescriptions   Medication  Sig  Dispense  Refill   .  acetaminophen (  TYLENOL) 500 MG tablet  Take 500 mg by mouth every 6 (six) hours as needed for moderate pain.     .  Aclidinium Bromide 400 MCG/ACT AEPB  Inhale into the lungs.     .  ALPRAZolam (XANAX) 0.5 MG tablet  Take 0.5 mg by mouth at bedtime as needed for sleep.     Marland Kitchen  amLODipine (NORVASC) 5 MG tablet  Take 5 mg by mouth daily.     .  bimatoprost (LUMIGAN) 0.03 % ophthalmic solution  Place 1 drop into both eyes at bedtime.     .  Brimonidine Tartrate-Timolol (COMBIGAN OP)  Apply 1 drop to eye 2 (two) times daily.     .  brinzolamide (AZOPT) 1 % ophthalmic suspension  Place 1 drop into the left eye 2 (two) times daily.     Marland Kitchen  buPROPion (WELLBUTRIN SR) 150 MG 12 hr tablet  Take 150 mg by mouth 2 (two) times daily.     .  Calcium Carbonate-Vitamin D (CALCIUM + D PO)  Take 1 tablet by mouth 3 (three) times daily.     .  Coenzyme Q10 (CO Q 10 PO)  Take 1 tablet by mouth daily.     Marland Kitchen  esomeprazole (NEXIUM) 40 MG capsule  Take by mouth.     [facility]) 0.045-0.015 MG/DAY  Place onto the skin.     Marland Kitchen  HYDROcodone-acetaminophen (NORCO/VICODIN) 5-325 MG  per tablet  Take 1 tablet by mouth every 6 (six) hours as needed for moderate pain.     Marland Kitchen  ipratropium (ATROVENT) 0.06 % nasal spray  Place into the nose.     .  loperamide (IMODIUM) 2 MG capsule  Take by mouth as needed for diarrhea or loose stools.     .  ondansetron (ZOFRAN) 8 MG tablet  Take 8 mg by mouth as needed for nausea or vomiting.     .  rosuvastatin (CRESTOR) 10 MG tablet  Take 10 mg by mouth daily.     .  metroNIDAZOLE (FLAGYL) 500 MG tablet  Take 1 tablet (500 mg total) by mouth See admin instructions. Take one (1) tablet at 6 PM and one (1) tablet at 11 PM the night prior to surgery.  2 tablet  0   .  neomycin (MYCIFRADIN) 500 MG tablet  Take 2 tablets (1,000 mg total) by mouth See admin instructions. Take two (2) tablets at 6 PM and two (2) tablets at 11 PM the night prior to surgery.  4 tablet  0    No current facility-administered medications for this visit.    Review of Systems  Review of Systems  Constitutional: Negative.  Respiratory: Positive for shortness of breath.  Cardiovascular: Negative.  Gastrointestinal: Positive for diarrhea and constipation. Negative for blood in stool.   Blood pressure 132/72, pulse 80, resp. rate 14, height 5' 4.5" (1.638 m), weight 101 lb (45.813 kg).  Physical Exam  Physical Exam  Constitutional: She is oriented to person, place, and time. She appears well-developed and well-nourished.  Neck: Neck supple.  Cardiovascular: Normal rate, regular rhythm and normal heart sounds.  Pulmonary/Chest: Effort normal and breath sounds normal.  Abdominal: Soft. Normal appearance and bowel sounds are normal. There is no tenderness.  Lymphadenopathy:  She has no cervical adenopathy.  Neurological: She is alert and oriented to person, place, and time.  Skin: Skin is warm and dry.   Data Reviewed  05/14/2015 colonoscopy images reviewed. Polyp at the ileocecal valve  extending visually to within the valve orifice. Biopsies were not completed as it was  anticipated she would be a candidate for endoscopic ultrasound resection.  Assessment   20 mm polyp of the ileocecal valve.  History of diarrhea alternating with constipation secondary to narcotic use for diarrhea.  History of low weight and difficult weight gain.   Plan   Options for management were reviewed. Considering the location it's unlikely this can be completely resected endoscopically. The appearance is that of a serrated adenoma or more likely a sessile tubular adenoma with low malignant potential. Observation could be undertaken versus surgical resection. With her history of diarrhea we'll try to minimize loss of the terminal ileum and right colon, recognizing that if an occult malignancy is identified formal right colectomy might be required. As she is re: having episodes of 4-6 stools per day, loss of the TI and right colon could produce profound diarrhea.   Make use of daily fiber and less imodium to minimize rebound constipation.  Discussed risk and benefits of surgery.  Patient is scheduled for surgery at [facility] on 06/15/15. She will pre admit at the hospital on 06/05/15 at 11:00 am. Prescriptions for pre surgery antibiotics have been called into the patients pharmacy. Patient is aware of date and instructions.  PCP: Damaris Hippo  05/25/2015, 12:34 PM

## 2015-06-01 ENCOUNTER — Telehealth: Payer: Self-pay

## 2015-06-01 NOTE — Telephone Encounter (Signed)
Spoke with the patient about rescheduling her surgery. Patient is now scheduled for surgery at [facility] on 06/14/15. She will pre admit at the hospital on 06/05/15 at 11:00 am. Patent is aware of date and instructions.

## 2015-06-01 NOTE — Telephone Encounter (Signed)
Patient called back and states that she is uncomfortable with having her surgery done when Dr Bary Castilla will not be on call for the weekend. She has decided to change her surgery date to 06/22/15 at [facility]. Patient is rescheduled for surgery on 06/22/15. She is aware of date and instructions.

## 2015-06-05 ENCOUNTER — Encounter
Admission: RE | Admit: 2015-06-05 | Discharge: 2015-06-05 | Disposition: A | Discharge status: Home / Self Care | Payer: Medicare PPO | Source: Ambulatory Visit | Attending: General Surgery | Admitting: General Surgery

## 2015-06-05 ENCOUNTER — Encounter: Payer: Self-pay | Admitting: Anesthesiology

## 2015-06-05 DIAGNOSIS — R1310 Dysphagia, unspecified: Secondary | ICD-10-CM | POA: Insufficient documentation

## 2015-06-05 DIAGNOSIS — Z01812 Encounter for preprocedural laboratory examination: Secondary | ICD-10-CM | POA: Insufficient documentation

## 2015-06-05 DIAGNOSIS — I10 Essential (primary) hypertension: Secondary | ICD-10-CM | POA: Diagnosis not present

## 2015-06-05 DIAGNOSIS — K529 Noninfective gastroenteritis and colitis, unspecified: Secondary | ICD-10-CM | POA: Diagnosis not present

## 2015-06-05 DIAGNOSIS — Z01810 Encounter for preprocedural cardiovascular examination: Secondary | ICD-10-CM | POA: Diagnosis present

## 2015-06-05 HISTORY — DX: Gastric ulcer, unspecified as acute or chronic, without hemorrhage or perforation: K25.9

## 2015-06-05 HISTORY — DX: Anemia, unspecified: D64.9

## 2015-06-05 HISTORY — DX: Urinary tract infection, site not specified: N39.0

## 2015-06-05 HISTORY — DX: Hyperlipidemia, unspecified: E78.5

## 2015-06-05 HISTORY — DX: Unspecified chronic gastritis without bleeding: K29.50

## 2015-06-05 HISTORY — DX: Disorder of kidney and ureter, unspecified: N28.9

## 2015-06-05 HISTORY — DX: Dysphagia, unspecified: R13.10

## 2015-06-05 LAB — CBC
HCT: 39.7 % (ref 35.0–47.0)
Hemoglobin: 13 g/dL (ref 12.0–16.0)
MCH: 29.2 pg (ref 26.0–34.0)
MCHC: 32.9 g/dL (ref 32.0–36.0)
MCV: 88.8 fL (ref 80.0–100.0)
PLATELETS: 224 10*3/uL (ref 150–440)
RBC: 4.47 MIL/uL (ref 3.80–5.20)
RDW: 15.5 % — ABNORMAL HIGH (ref 11.5–14.5)
WBC: 6.9 10*3/uL (ref 3.6–11.0)

## 2015-06-05 LAB — DIFFERENTIAL
Basophils Absolute: 0.1 10*3/uL (ref 0–0.1)
Basophils Relative: 1 %
Eosinophils Absolute: 0.3 10*3/uL (ref 0–0.7)
Eosinophils Relative: 4 %
LYMPHS PCT: 34 %
Lymphs Abs: 2.4 10*3/uL (ref 1.0–3.6)
Monocytes Absolute: 0.5 10*3/uL (ref 0.2–0.9)
Monocytes Relative: 7 %
NEUTROS PCT: 54 %
Neutro Abs: 3.7 10*3/uL (ref 1.4–6.5)

## 2015-06-05 NOTE — Pre-Procedure Instructions (Signed)
Dr. Marcello Moores into see pt and husband at 12:40

## 2015-06-05 NOTE — Patient Instructions (Signed)
  Your procedure is scheduled on: Friday July 1, [address]016 Report to Same Day Surgery. To find out your arrival time please call [phone] between 1PM - 3PM on June 21, 2015 .  Remember: Instructions that are not followed completely may result in serious medical risk, up to and including death, or upon the discretion of your surgeon and anesthesiologist your surgery may need to be rescheduled.    __x__ 1. Do not eat food or drink liquids after midnight. No gum chewing or hard candies.     __x__ 2. No Alcohol for 24 hours before or after surgery.   ____ 3. Bring all medications with you on the day of surgery if instructed.    __x__ 4. Notify your doctor if there is any change in your medical condition     (cold, fever, infections).     Do not wear jewelry, make-up, hairpins, clips or nail polish.  Do not wear lotions, powders, or perfumes. You may wear deodorant.  Do not shave 48 hours prior to surgery. Men may shave face and neck.  Do not bring valuables to the hospital.    [facility] is not responsible for any belongings or valuables.               Contacts, dentures or bridgework may not be worn into surgery.  Leave your suitcase in the car. After surgery it may be brought to your room.  For patients admitted to the hospital, discharge time is determined by your                treatment team.   Patients discharged the day of surgery will not be allowed to drive home.    Please read over the following fact sheets that you were given:   [facility] Preparing for Surgery  __x_ Take these medicines the morning of surgery with A SIP OF WATER:    1. Aclidinium Bromide 400 MCG/ACT AEPB  2. ALPRAZolam (XANAX) 0.5 MG tablet  3. amLODipine (NORVASC)  4.buPROPion (WELLBUTRIN SR)  5.esomeprazole ([place])   6.losartan (COZAAR)  ____ Fleet Enema (as directed)   _x___ Use CHG Soap as directed  _x___ Use inhalers on the day of surgery  ____ Stop metformin 2 days prior to  surgery    ____ Take 1/2 of usual insulin dose the night before surgery and none on the morning of surgery.   ____ Stop Coumadin/Plavix/aspirin on does not apply.  ____ Stop Anti-inflammatories on does not apply.  Tylenol OK to take for pain.   ____ Stop supplements until after surgery.    ____ Bring C-Pap to the hospital.

## 2015-06-22 ENCOUNTER — Encounter: Payer: Self-pay | Admitting: *Deleted

## 2015-06-22 ENCOUNTER — Ambulatory Visit: Payer: Medicare PPO | Admitting: Anesthesiology

## 2015-06-22 ENCOUNTER — Ambulatory Visit: Payer: Medicare PPO

## 2015-06-22 ENCOUNTER — Encounter
Admission: RE | Disposition: A | Discharge status: Home / Self Care | Payer: Self-pay | Source: Ambulatory Visit | Attending: General Surgery

## 2015-06-22 ENCOUNTER — Observation Stay
Admission: RE | Admit: 2015-06-22 | Discharge: 2015-06-25 | Disposition: A | Discharge status: Home / Self Care | Payer: Medicare PPO | Source: Ambulatory Visit | Attending: General Surgery | Admitting: General Surgery

## 2015-06-22 DIAGNOSIS — F319 Bipolar disorder, unspecified: Secondary | ICD-10-CM | POA: Insufficient documentation

## 2015-06-22 DIAGNOSIS — Z87891 Personal history of nicotine dependence: Secondary | ICD-10-CM | POA: Diagnosis not present

## 2015-06-22 DIAGNOSIS — Z853 Personal history of malignant neoplasm of breast: Secondary | ICD-10-CM | POA: Diagnosis not present

## 2015-06-22 DIAGNOSIS — Z9889 Other specified postprocedural states: Secondary | ICD-10-CM | POA: Diagnosis not present

## 2015-06-22 DIAGNOSIS — Z79899 Other long term (current) drug therapy: Secondary | ICD-10-CM | POA: Insufficient documentation

## 2015-06-22 DIAGNOSIS — Z860100 Personal history of colon polyps, unspecified: Secondary | ICD-10-CM

## 2015-06-22 DIAGNOSIS — H409 Unspecified glaucoma: Secondary | ICD-10-CM | POA: Diagnosis not present

## 2015-06-22 DIAGNOSIS — Z79890 Hormone replacement therapy (postmenopausal): Secondary | ICD-10-CM | POA: Diagnosis not present

## 2015-06-22 DIAGNOSIS — J449 Chronic obstructive pulmonary disease, unspecified: Secondary | ICD-10-CM | POA: Diagnosis not present

## 2015-06-22 DIAGNOSIS — D120 Benign neoplasm of cecum: Secondary | ICD-10-CM | POA: Diagnosis not present

## 2015-06-22 DIAGNOSIS — Z881 Allergy status to other antibiotic agents status: Secondary | ICD-10-CM | POA: Diagnosis not present

## 2015-06-22 DIAGNOSIS — I129 Hypertensive chronic kidney disease with stage 1 through stage 4 chronic kidney disease, or unspecified chronic kidney disease: Secondary | ICD-10-CM | POA: Diagnosis not present

## 2015-06-22 DIAGNOSIS — Z882 Allergy status to sulfonamides status: Secondary | ICD-10-CM | POA: Insufficient documentation

## 2015-06-22 DIAGNOSIS — N189 Chronic kidney disease, unspecified: Secondary | ICD-10-CM | POA: Insufficient documentation

## 2015-06-22 DIAGNOSIS — M1990 Unspecified osteoarthritis, unspecified site: Secondary | ICD-10-CM | POA: Diagnosis not present

## 2015-06-22 DIAGNOSIS — Z803 Family history of malignant neoplasm of breast: Secondary | ICD-10-CM | POA: Diagnosis not present

## 2015-06-22 DIAGNOSIS — K635 Polyp of colon: Secondary | ICD-10-CM | POA: Diagnosis present

## 2015-06-22 DIAGNOSIS — Z9049 Acquired absence of other specified parts of digestive tract: Secondary | ICD-10-CM | POA: Insufficient documentation

## 2015-06-22 DIAGNOSIS — Z887 Allergy status to serum and vaccine status: Secondary | ICD-10-CM | POA: Diagnosis not present

## 2015-06-22 DIAGNOSIS — Z79891 Long term (current) use of opiate analgesic: Secondary | ICD-10-CM | POA: Insufficient documentation

## 2015-06-22 HISTORY — PX: LAPAROSCOPIC RIGHT COLECTOMY: SHX5925

## 2015-06-22 SURGERY — COLECTOMY, RIGHT, LAPAROSCOPIC
Anesthesia: General | Laterality: Right | Wound class: Clean Contaminated

## 2015-06-22 MED ORDER — ACETAMINOPHEN 10 MG/ML IV SOLN
INTRAVENOUS | Status: DC | PRN
  Administered 2015-06-22: 1000 mg via INTRAVENOUS

## 2015-06-22 MED ORDER — OXYCODONE HCL 5 MG/5ML PO SOLN: 5.0000 mg | Freq: Once | ORAL | Status: DC | PRN

## 2015-06-22 MED ORDER — OXYCODONE HCL 5 MG PO TABS: 5.0000 mg | ORAL_TABLET | Freq: Once | ORAL | Status: DC | PRN

## 2015-06-22 MED ORDER — BIMATOPROST 0.03 % OP SOLN
1.0000 [drp] | Freq: Every day | OPHTHALMIC | Status: DC
  Filled 2015-06-22: qty 2.5

## 2015-06-22 MED ORDER — ONDANSETRON HCL 4 MG/2ML IJ SOLN
INTRAMUSCULAR | Status: DC | PRN
  Administered 2015-06-22: 4 mg via INTRAVENOUS

## 2015-06-22 MED ORDER — PANTOPRAZOLE SODIUM 40 MG PO TBEC
40.0000 mg | DELAYED_RELEASE_TABLET | Freq: Every day | ORAL | Status: DC
  Administered 2015-06-23 – 2015-06-24 (×2): 40 mg via ORAL
  Filled 2015-06-22 (×4): qty 1

## 2015-06-22 MED ORDER — BRINZOLAMIDE 1 % OP SUSP
1.0000 [drp] | Freq: Two times a day (BID) | OPHTHALMIC | Status: DC
  Administered 2015-06-23: 1 [drp] via OPHTHALMIC
  Filled 2015-06-22: qty 10

## 2015-06-22 MED ORDER — LIDOCAINE HCL (CARDIAC) 20 MG/ML IV SOLN
INTRAVENOUS | Status: DC | PRN
  Administered 2015-06-22: 60 mg via INTRAVENOUS

## 2015-06-22 MED ORDER — MIDAZOLAM HCL 2 MG/2ML IJ SOLN
INTRAMUSCULAR | Status: DC | PRN
  Administered 2015-06-22: 2 mg via INTRAVENOUS

## 2015-06-22 MED ORDER — ACETAMINOPHEN 325 MG PO TABS
650.0000 mg | ORAL_TABLET | ORAL | Status: DC | PRN
  Administered 2015-06-23 (×2): 650 mg via ORAL
  Filled 2015-06-22 (×2): qty 2

## 2015-06-22 MED ORDER — HEPARIN SODIUM (PORCINE) 5000 UNIT/ML IJ SOLN
5000.0000 [IU] | Freq: Three times a day (TID) | INTRAMUSCULAR | Status: DC
  Administered 2015-06-23 (×3): 5000 [IU] via SUBCUTANEOUS
  Filled 2015-06-22 (×3): qty 1

## 2015-06-22 MED ORDER — FENTANYL CITRATE (PF) 100 MCG/2ML IJ SOLN
25.0000 ug | INTRAMUSCULAR | Status: DC | PRN
  Administered 2015-06-22: 25 ug via INTRAVENOUS

## 2015-06-22 MED ORDER — ALVIMOPAN 12 MG PO CAPS
ORAL_CAPSULE | ORAL | Status: AC
  Administered 2015-06-22: 12 mg via ORAL
  Filled 2015-06-22: qty 1

## 2015-06-22 MED ORDER — FENTANYL CITRATE (PF) 100 MCG/2ML IJ SOLN
25.0000 ug | INTRAMUSCULAR | Status: DC | PRN
  Administered 2015-06-22 (×3): 25 ug via INTRAVENOUS

## 2015-06-22 MED ORDER — KETOROLAC TROMETHAMINE 30 MG/ML IJ SOLN
INTRAMUSCULAR | Status: DC | PRN
  Administered 2015-06-22: 30 mg via INTRAVENOUS

## 2015-06-22 MED ORDER — ACLIDINIUM BROMIDE 400 MCG/ACT IN AEPB
1.0000 | INHALATION_SPRAY | Freq: Two times a day (BID) | RESPIRATORY_TRACT | Status: DC
  Administered 2015-06-23: 1 via RESPIRATORY_TRACT

## 2015-06-22 MED ORDER — MORPHINE SULFATE 2 MG/ML IJ SOLN
2.0000 mg | INTRAMUSCULAR | Status: DC | PRN
  Administered 2015-06-22 – 2015-06-23 (×3): 2 mg via INTRAVENOUS
  Filled 2015-06-22 (×3): qty 1

## 2015-06-22 MED ORDER — LACTATED RINGERS IV SOLN
INTRAVENOUS | Status: DC
  Administered 2015-06-22: 08:00:00 via INTRAVENOUS

## 2015-06-22 MED ORDER — SODIUM CHLORIDE 0.9 % IV SOLN
1.0000 g | INTRAVENOUS | Status: AC
  Administered 2015-06-22: 1 g via INTRAVENOUS
  Filled 2015-06-22: qty 1

## 2015-06-22 MED ORDER — LOSARTAN POTASSIUM 50 MG PO TABS
25.0000 mg | ORAL_TABLET | ORAL | Status: DC
  Administered 2015-06-23 – 2015-06-25 (×2): 25 mg via ORAL
  Filled 2015-06-22 (×2): qty 1
  Filled 2015-06-22: qty 2
  Filled 2015-06-22 (×3): qty 1

## 2015-06-22 MED ORDER — FENTANYL CITRATE (PF) 100 MCG/2ML IJ SOLN
INTRAMUSCULAR | Status: AC
  Administered 2015-06-22: 25 ug via INTRAVENOUS
  Filled 2015-06-22: qty 2

## 2015-06-22 MED ORDER — ALVIMOPAN 12 MG PO CAPS
12.0000 mg | ORAL_CAPSULE | Freq: Once | ORAL | Status: AC
  Administered 2015-06-22: 12 mg via ORAL

## 2015-06-22 MED ORDER — LORATADINE 10 MG PO TABS
10.0000 mg | ORAL_TABLET | Freq: Every day | ORAL | Status: DC | PRN
  Filled 2015-06-22: qty 1

## 2015-06-22 MED ORDER — FENTANYL CITRATE (PF) 100 MCG/2ML IJ SOLN
INTRAMUSCULAR | Status: DC | PRN
  Administered 2015-06-22 (×4): 50 ug via INTRAVENOUS

## 2015-06-22 MED ORDER — LACTATED RINGERS IV SOLN
INTRAVENOUS | Status: DC | PRN
  Administered 2015-06-22: 09:00:00 via INTRAVENOUS

## 2015-06-22 MED ORDER — HYDROCODONE-ACETAMINOPHEN 5-325 MG PO TABS
1.0000 | ORAL_TABLET | ORAL | Status: DC | PRN
  Administered 2015-06-22 (×2): 2 via ORAL
  Administered 2015-06-22: 1 via ORAL
  Administered 2015-06-23: 2 via ORAL
  Administered 2015-06-23: 1 via ORAL
  Administered 2015-06-23: 2 via ORAL
  Administered 2015-06-23: 1 via ORAL
  Administered 2015-06-23 – 2015-06-24 (×2): 2 via ORAL
  Administered 2015-06-24: 1 via ORAL
  Administered 2015-06-24: 2 via ORAL
  Administered 2015-06-24: 1 via ORAL
  Administered 2015-06-25: 2 via ORAL
  Administered 2015-06-25: 1 via ORAL
  Filled 2015-06-22 (×2): qty 2
  Filled 2015-06-22 (×2): qty 1
  Filled 2015-06-22 (×7): qty 2
  Filled 2015-06-22 (×2): qty 1
  Filled 2015-06-22: qty 2
  Filled 2015-06-22: qty 1

## 2015-06-22 MED ORDER — BUPIVACAINE-EPINEPHRINE (PF) 0.5% -1:200000 IJ SOLN
INTRAMUSCULAR | Status: AC
  Filled 2015-06-22: qty 30

## 2015-06-22 MED ORDER — EPHEDRINE SULFATE 50 MG/ML IJ SOLN
INTRAMUSCULAR | Status: DC | PRN
  Administered 2015-06-22: 10 mg via INTRAVENOUS

## 2015-06-22 MED ORDER — NICOTINE POLACRILEX 2 MG MT GUM
2.0000 mg | CHEWING_GUM | OROMUCOSAL | Status: DC | PRN
  Filled 2015-06-22: qty 1

## 2015-06-22 MED ORDER — ROCURONIUM BROMIDE 100 MG/10ML IV SOLN
INTRAVENOUS | Status: DC | PRN
  Administered 2015-06-22: 40 mg via INTRAVENOUS

## 2015-06-22 MED ORDER — ACETAMINOPHEN 10 MG/ML IV SOLN
INTRAVENOUS | Status: AC
  Filled 2015-06-22: qty 100

## 2015-06-22 MED ORDER — KETOROLAC TROMETHAMINE 15 MG/ML IJ SOLN
15.0000 mg | Freq: Three times a day (TID) | INTRAMUSCULAR | Status: DC
  Administered 2015-06-22 – 2015-06-23 (×4): 15 mg via INTRAVENOUS
  Filled 2015-06-22 (×4): qty 1

## 2015-06-22 MED ORDER — ONDANSETRON HCL 4 MG PO TABS
4.0000 mg | ORAL_TABLET | ORAL | Status: DC | PRN
  Administered 2015-06-22: 4 mg via ORAL
  Filled 2015-06-22: qty 1

## 2015-06-22 MED ORDER — BUPROPION HCL ER (SR) 150 MG PO TB12
150.0000 mg | ORAL_TABLET | ORAL | Status: DC
  Administered 2015-06-23 – 2015-06-25 (×2): 150 mg via ORAL
  Filled 2015-06-22 (×6): qty 1

## 2015-06-22 MED ORDER — ALPRAZOLAM 0.5 MG PO TABS
0.5000 mg | ORAL_TABLET | Freq: Two times a day (BID) | ORAL | Status: DC | PRN
  Administered 2015-06-23 – 2015-06-25 (×2): 0.5 mg via ORAL
  Filled 2015-06-22 (×2): qty 1

## 2015-06-22 MED ORDER — PROPOFOL 10 MG/ML IV BOLUS
INTRAVENOUS | Status: DC | PRN
  Administered 2015-06-22: 110 mg via INTRAVENOUS

## 2015-06-22 MED ORDER — PROMETHAZINE HCL 25 MG/ML IJ SOLN: 6.2500 mg | INTRAMUSCULAR | Status: DC | PRN

## 2015-06-22 MED ORDER — ALVIMOPAN 12 MG PO CAPS: 12.0000 mg | ORAL_CAPSULE | Freq: Two times a day (BID) | ORAL | Status: DC

## 2015-06-22 MED ORDER — SUGAMMADEX SODIUM 200 MG/2ML IV SOLN
INTRAVENOUS | Status: DC | PRN
  Administered 2015-06-22: 100 mg via INTRAVENOUS

## 2015-06-22 MED ORDER — ACETAMINOPHEN 650 MG RE SUPP: 650.0000 mg | Freq: Four times a day (QID) | RECTAL | Status: DC | PRN

## 2015-06-22 MED ORDER — IPRATROPIUM BROMIDE 0.06 % NA SOLN
2.0000 | Freq: Three times a day (TID) | NASAL | Status: DC
  Administered 2015-06-23: 2 via NASAL
  Filled 2015-06-22: qty 15

## 2015-06-22 MED ORDER — LACTATED RINGERS IV SOLN
INTRAVENOUS | Status: DC
  Administered 2015-06-22 – 2015-06-23 (×2): via INTRAVENOUS

## 2015-06-22 MED ORDER — ONDANSETRON HCL 4 MG/2ML IJ SOLN: 4.0000 mg | Freq: Four times a day (QID) | INTRAMUSCULAR | Status: DC | PRN

## 2015-06-22 MED ORDER — AMLODIPINE BESYLATE 5 MG PO TABS
5.0000 mg | ORAL_TABLET | ORAL | Status: DC
  Administered 2015-06-23 – 2015-06-25 (×2): 5 mg via ORAL
  Filled 2015-06-22 (×6): qty 1

## 2015-06-22 MED ORDER — GUAIFENESIN-DM 100-10 MG/5ML PO SYRP: 10.0000 mL | ORAL_SOLUTION | ORAL | Status: DC | PRN

## 2015-06-22 MED ORDER — ESTRADIOL-LEVONORGESTREL 0.045-0.015 MG/DAY TD PTWK: 1.0000 | MEDICATED_PATCH | TRANSDERMAL | Status: DC

## 2015-06-22 MED ORDER — DEXAMETHASONE SODIUM PHOSPHATE 4 MG/ML IJ SOLN
INTRAMUSCULAR | Status: DC | PRN
  Administered 2015-06-22: 5 mg via INTRAVENOUS

## 2015-06-22 MED ORDER — BRIMONIDINE TARTRATE-TIMOLOL 0.2-0.5 % OP SOLN
1.0000 [drp] | Freq: Two times a day (BID) | OPHTHALMIC | Status: DC
  Administered 2015-06-23: 1 [drp] via OPHTHALMIC

## 2015-06-22 SURGICAL SUPPLY — 73 items
APPLIER CLIP ROT 10 11.4 M/L (STAPLE)
BLADE SURG 10 STRL SS SAFETY (BLADE) ×3 IMPLANT
BLADE SURG 11 STRL SS SAFETY (MISCELLANEOUS) ×3 IMPLANT
CANISTER SUCT 1200ML W/VALVE (MISCELLANEOUS) ×3 IMPLANT
CANNULA DILATOR 10 W/SLV (CANNULA) ×2 IMPLANT
CANNULA DILATOR 10MM W/SLV (CANNULA) ×1
CATH TRAY 16F METER LATEX (MISCELLANEOUS) ×3 IMPLANT
CHLORAPREP W/TINT 26ML (MISCELLANEOUS) ×3 IMPLANT
CLIP APPLIE ROT 10 11.4 M/L (STAPLE) IMPLANT
CLOSURE WOUND 1/2 X4 (GAUZE/BANDAGES/DRESSINGS)
COVER CLAMP SIL LG PBX B (MISCELLANEOUS) IMPLANT
DEVICE HAND ACCESS DEXTUS (MISCELLANEOUS) IMPLANT
DRAPE LAP W/FLUID (DRAPES) ×3 IMPLANT
DRAPE UNDER BUTTOCK W/FLU (DRAPES) IMPLANT
DRSG OPSITE POSTOP 4X10 (GAUZE/BANDAGES/DRESSINGS) IMPLANT
DRSG OPSITE POSTOP 4X8 (GAUZE/BANDAGES/DRESSINGS) ×3 IMPLANT
DRSG TEGADERM 2-3/8X2-3/4 SM (GAUZE/BANDAGES/DRESSINGS) ×12 IMPLANT
DRSG TEGADERM 4X4.75 (GAUZE/BANDAGES/DRESSINGS) ×3 IMPLANT
DRSG TELFA 3X8 NADH (GAUZE/BANDAGES/DRESSINGS) ×3 IMPLANT
ELECT BLADE 6.5 EXT (BLADE) IMPLANT
FILTER LAP SMOKE EVAC STRL (MISCELLANEOUS) ×3 IMPLANT
GLOVE BIO SURGEON STRL SZ7 (GLOVE) ×12 IMPLANT
GLOVE BIO SURGEON STRL SZ7.5 (GLOVE) ×12 IMPLANT
GLOVE EXAM NITRILE PF MED BLUE (GLOVE) ×3 IMPLANT
GLOVE INDICATOR 8.0 STRL GRN (GLOVE) ×9 IMPLANT
GOWN STRL REUS W/ TWL LRG LVL3 (GOWN DISPOSABLE) ×5 IMPLANT
GOWN STRL REUS W/TWL LRG LVL3 (GOWN DISPOSABLE) ×10
HANDLE YANKAUER SUCT BULB TIP (MISCELLANEOUS) ×3 IMPLANT
IRRIGATION STRYKERFLOW (MISCELLANEOUS) IMPLANT
IRRIGATOR STRYKERFLOW (MISCELLANEOUS)
IV LACTATED RINGERS 1000ML (IV SOLUTION) ×3 IMPLANT
KIT RM TURNOVER STRD PROC AR (KITS) ×3 IMPLANT
LABEL OR SOLS (LABEL) ×3 IMPLANT
NDL INSUFF ACCESS 14 VERSASTEP (NEEDLE) ×3 IMPLANT
NEEDLE HYPO 22GX1.5 SAFETY (NEEDLE) ×3 IMPLANT
NS IRRIG 500ML POUR BTL (IV SOLUTION) ×3 IMPLANT
PACK COLON CLEAN CLOSURE (MISCELLANEOUS) ×3 IMPLANT
PACK LAP CHOLECYSTECTOMY (MISCELLANEOUS) ×3 IMPLANT
PAD GROUND ADULT SPLIT (MISCELLANEOUS) ×3 IMPLANT
PAD PREP 24X41 OB/GYN DISP (PERSONAL CARE ITEMS) IMPLANT
PENCIL ELECTRO HAND CTR (MISCELLANEOUS) ×3 IMPLANT
PROT DEXTUS HAND ACCESS (MISCELLANEOUS)
RELOAD PROXIMATE 75MM BLUE (ENDOMECHANICALS) ×3 IMPLANT
RETAINER VISCERA MED (MISCELLANEOUS) IMPLANT
RETRACTOR FIXED LENGTH SML (MISCELLANEOUS) IMPLANT
RETRACTOR WND ALEXIS-O 25 LRG (MISCELLANEOUS) IMPLANT
RTRCTR WOUND ALEXIS O 25CM LRG (MISCELLANEOUS)
SCISSORS METZENBAUM CVD 33 (INSTRUMENTS) IMPLANT
SEAL FOR SCOPE WARMER C3101 (MISCELLANEOUS) ×3 IMPLANT
SET YANKAUER POOLE SUCT (MISCELLANEOUS) ×3 IMPLANT
SHEARS HARMONIC ACE PLUS 36CM (ENDOMECHANICALS) ×3 IMPLANT
SPONGE LAP 18X18 5 PK (GAUZE/BANDAGES/DRESSINGS) ×6 IMPLANT
STAPLER PROXIMATE 75MM BLUE (STAPLE) ×3 IMPLANT
STRIP CLOSURE SKIN 1/2X4 (GAUZE/BANDAGES/DRESSINGS) IMPLANT
SUT PDS AB 2-0 CT1 27 (SUTURE) ×3 IMPLANT
SUT PROLENE 0 CT 1 30 (SUTURE) ×9 IMPLANT
SUT SILK 2 0 (SUTURE) ×2
SUT SILK 2-0 30XBRD TIE 12 (SUTURE) ×1 IMPLANT
SUT SILK 3-0 (SUTURE) ×3 IMPLANT
SUT VIC AB 2-0 BRD 54 (SUTURE) ×3 IMPLANT
SUT VIC AB 2-0 CT1 27 (SUTURE) ×4
SUT VIC AB 2-0 CT1 TAPERPNT 27 (SUTURE) ×2 IMPLANT
SUT VIC AB 2-0 SH 27 (SUTURE) ×2
SUT VIC AB 2-0 SH 27XBRD (SUTURE) ×1 IMPLANT
SUT VIC AB 3-0 54X BRD REEL (SUTURE) ×2 IMPLANT
SUT VIC AB 3-0 BRD 54 (SUTURE) ×4
SUT VIC AB 3-0 SH 27 (SUTURE) ×6
SUT VIC AB 3-0 SH 27X BRD (SUTURE) ×3 IMPLANT
SUT VIC AB 4-0 FS2 27 (SUTURE) ×9 IMPLANT
TROCAR XCEL NON-BLD 11X100MML (ENDOMECHANICALS) ×3 IMPLANT
TROCAR XCEL UNIV SLVE 11M 100M (ENDOMECHANICALS) ×6 IMPLANT
TUBING INSUFFLATOR HEATED (MISCELLANEOUS) ×3 IMPLANT
WATER STERILE IRR 1000ML POUR (IV SOLUTION) ×3 IMPLANT

## 2015-06-22 NOTE — H&P (Signed)
No change in clinical condition. Lungs: Clear. Cardio: RR. ABD: Soft. Plan: Removal of cecum and terminal ileum for a polyp.

## 2015-06-22 NOTE — Anesthesia Postprocedure Evaluation (Signed)
  Anesthesia Post-op Note  Patient: Elaine Clayton  Procedure(s) Performed: Procedure(s): LAPAROSCOPIC RIGHT COLECTOMY (Right)  Anesthesia type:General, General ETT  Patient location: PACU  Post pain: Pain level controlled  Post assessment: Post-op Vital signs reviewed, Patient's Cardiovascular Status Stable, Respiratory Function Stable, Patent Airway and No signs of Nausea or vomiting  Post vital signs: Reviewed and stable  Last Vitals:  Filed Vitals:   06/22/15 1158  BP: 120/57  Pulse: 91  Temp: 36.4 C  Resp: 17   No swelling or redness appreciated in arm at extravasation site.  Good perfusion distally.  Level of consciousness: awake, alert  and patient cooperative  Complications: No apparent anesthesia complications

## 2015-06-22 NOTE — Progress Notes (Signed)
Ok to start abx on call to OR per Floyce Stakes, RN To OR with bair paws, thermal cap, scds in place Sacral patch and regular hosp gown sent to OR with patient

## 2015-06-22 NOTE — Op Note (Signed)
Preoperative diagnosis: Polyp at the ileocecal valve.   Postoperative diagnosis: Same.  Operative procedure: Laparoscopic assisted resection of the terminal ileum and cecum.  Operative surgeon: Ollen Bowl, M.D.  Anesthesia: Gen. endotracheal, Marcaine 0.5% with 1-200,000 units of epinephrine, 20 mL local infiltration  Assessment blood loss less than 10 mL.  Clinical note this 69 year old woman underwent a screening colonoscopy with identification of a polyp on the ileocecal valve. This was not felt to be amenable to endoscopic resection. Due to the patient's long history of bowel dysfunction and occasional episodes of diarrhea was elected to a limited resection to minimize the chance of bile salt malabsorption and worsening diarrhea.  The patient received Entereg as well as Invanz prior to the procedure. SCD stockings were used for DVT prophylaxis.  Operative note:  The patient underwent general endotracheal anesthesia without difficulty. A Foley catheter was placed by the nurse and removed at the end of the procedure. The abdomen was prepped with chlor prep and drape. In Trendelenburg position and with trans-umbilical incision a varies needle was passed. After sharing intra-abdominal location with the hanging drop test the abdomen was insufflated with CO2 a 10 mmHg pressure. A 12 mm step port was expanded and inspection showed no evidence of injury from initial port placement. An 11 mm XL port was placed in the hypogastrium and in the epigastrium. There was a single band of adhesions from her previous appendectomy. The right colon was mobilized from the midportion distally and the terminal ileum freed from the retroperitoneum. The bowel easily reached the anterior abdominal wall and at this point the abdomen was desufflated. Her previous appendectomy incision was opened and the right colon and cecum and terminal ileum easily delivered. A side-to-side functional end-to-end anastomosis was  completed. After division of the mesentery with the Harmonic scalpel the terminal ileum and the right colon were brought together with interrupted 3-0 silk seromuscular sutures. Both lumens were opened and the 75 mm GIA stapler passed and fired. Good hemostasis was appreciated. The anastomosis was completed with a second application of the same stapler. A bleeding point was controlled with  3-0 Vicryls time. The anastomosis was reinforced with interrupted 3-0 silk sutures. The mesentery was closed with a running 3-0 Vicryls suture. Inspection of the right lower quadrant showed good hemostasis. The bowel returned to the abdominal cavity. The peritoneum and transverse abdominis fascia layer were closed as one with a running 2-0 Vicryls suture. The anterior rectus sheath was closed with a running 2-0 PDS suture. Scarpa's fascia was closed with a running 3-0 Vicryls suture. The skin was closed with a running 4-0 Vicryls suture.  Fascia at the port sites were closed with  2-0 PDS sutures. Skin incisions were closed with interrupted 4-0 Vicryls septic sutures. Benzoin, Steri-Strips, Telfa and dressings were applied.  Specimen was examined and the index polyp was within the resected tissue.  The patient tolerated the procedure well and was brought to recovery in stable condition.

## 2015-06-22 NOTE — Anesthesia Procedure Notes (Signed)
Procedure Name: Intubation Date/Time: 06/22/2015 8:54 AM Performed by: Doreen Salvage Pre-anesthesia Checklist: Patient identified, Patient being monitored, Timeout performed, Emergency Drugs available and Suction available Patient Re-evaluated:Patient Re-evaluated prior to inductionOxygen Delivery Method: Circle system utilized Preoxygenation: Pre-oxygenation with 100% oxygen Intubation Type: IV induction Ventilation: Mask ventilation without difficulty Laryngoscope Size: Mac and 3 Grade View: Grade I Tube type: Oral Tube size: 7.0 mm Number of attempts: 1 Airway Equipment and Method: Stylet Placement Confirmation: ETT inserted through vocal cords under direct vision,  positive ETCO2 and breath sounds checked- equal and bilateral Secured at: 22 cm Tube secured with: Tape Dental Injury: Teeth and Oropharynx as per pre-operative assessment

## 2015-06-22 NOTE — Anesthesia Preprocedure Evaluation (Addendum)
Anesthesia Evaluation  Patient identified by MRN, date of birth, ID band Patient awake    Reviewed: Allergy & Precautions, H&P , NPO status , Patient's Chart, lab work & pertinent test results, reviewed documented beta blocker date and time   Airway Mallampati: II  TM Distance: >3 FB Neck ROM: full    Dental no notable dental hx.    Pulmonary neg pulmonary ROS, COPDformer smoker,  breath sounds clear to auscultation  Pulmonary exam normal       Cardiovascular Exercise Tolerance: Good hypertension, negative cardio ROS  Rhythm:regular Rate:Normal     Neuro/Psych PSYCHIATRIC DISORDERS negative neurological ROS  negative psych ROS   GI/Hepatic negative GI ROS, Neg liver ROS, PUD,   Endo/Other  negative endocrine ROS  Renal/GU Renal diseasenegative Renal ROS  negative genitourinary   Musculoskeletal  (+) Arthritis -,   Abdominal   Peds  Hematology negative hematology ROS (+)   Anesthesia Other Findings   Reproductive/Obstetrics negative OB ROS                            Anesthesia Physical  Anesthesia Plan  ASA: III  Anesthesia Plan: General and General ETT   Post-op Pain Management:    Induction:   Airway Management Planned:   Additional Equipment:   Intra-op Plan:   Post-operative Plan:   Informed Consent: I have reviewed the patients History and Physical, chart, labs and discussed the procedure including the risks, benefits and alternatives for the proposed anesthesia with the patient or authorized representative who has indicated his/her understanding and acceptance.   Dental Advisory Given  Plan Discussed with: CRNA, Anesthesiologist and Surgeon  Anesthesia Plan Comments:         Anesthesia Quick Evaluation

## 2015-06-22 NOTE — Transfer of Care (Signed)
Immediate Anesthesia Transfer of Care Note  Patient: Elaine Clayton  Procedure(s) Performed: Procedure(s): LAPAROSCOPIC RIGHT COLECTOMY (Right)  Patient Location: PACU  Anesthesia Type:General  Level of Consciousness: sedated  Airway & Oxygen Therapy: Patient Spontanous Breathing and Patient connected to face mask oxygen  Post-op Assessment: Report given to RN and Post -op Vital signs reviewed and stable  Post vital signs: Reviewed and stable  Last Vitals:  Filed Vitals:   06/22/15 1043  BP: 151/66  Pulse: 93  Temp: 36.2 C  Resp: 12    Complications: No apparent anesthesia complications

## 2015-06-23 DIAGNOSIS — D120 Benign neoplasm of cecum: Secondary | ICD-10-CM | POA: Diagnosis not present

## 2015-06-23 MED ORDER — DIPHENOXYLATE-ATROPINE 2.5-0.025 MG/5ML PO LIQD: 5.0000 mL | Freq: Once | ORAL | Status: DC

## 2015-06-23 MED ORDER — DIPHENOXYLATE-ATROPINE 2.5-0.025 MG PO TABS
1.0000 | ORAL_TABLET | Freq: Once | ORAL | Status: AC
  Administered 2015-06-23: 1 via ORAL
  Filled 2015-06-23: qty 1

## 2015-06-23 NOTE — Plan of Care (Signed)
Problem: Phase I Progression Outcomes Goal: Other Phase I Outcomes/Goals Outcome: Not Applicable Date Met:  74/73/40 No additional Phase Outcome/Goals identified at this time.  Problem: Phase II Progression Outcomes Goal: Other Phase II Outcomes/Goals Outcome: Not Applicable Date Met:  37/09/64 No additional Phase Outcome/Goals identified at this time.     Problem: Phase III Progression Outcomes Goal: Other Phase III Outcomes/Goals Outcome: Not Applicable Date Met:  38/38/18 No additional Phase Outcome/Goals identified at this time.

## 2015-06-23 NOTE — Progress Notes (Signed)
Called Dr. Bary Castilla and informed him that pt wanted some immodium because of diarrhea.  Doctor said that he talked to patient and medication causing diarrhea was stopped and diarrhea will subside and get better.  Christene Slates 06/23/2015  10:38 PM

## 2015-06-23 NOTE — Progress Notes (Signed)
Patient reports that she was having intermittent diarrhea (loose brown watery stools) every 10 -15 minutes for two nights prior to surgery. Not reported until this evening during our phone conversation. Reports now that she had black stools last night, bloody today (Heparin started this AM for DVT prophylaxis). Will d/c heparin, check stool for c diff, dose of lomotil tonight and check CBC in AM. (Entereg stopped earlier today).

## 2015-06-23 NOTE — Progress Notes (Signed)
Took vitals for Dr. Bary Castilla and they were within normal limits.  Dr. Bary Castilla put in order for Lomotil and d/c heparin. Christene Slates  06/23/2015  11:39 PM

## 2015-06-23 NOTE — Progress Notes (Signed)
Called Dr. Bary Castilla @ [phone] and informed him that patient has gone to the bathroom @ nine times and there were clumps of blood in the toilet.  Doctor will look at chart and put in orders.  Christene Slates  06/23/2015  11:05 PM

## 2015-06-23 NOTE — Progress Notes (Signed)
AVSS. Some nausea last PM, better today. Lungs: Clear. Cardio: RR. ABD: Minimal distension, soft. Good BS.  Multiple BM's.  Has ambulated to the nurses station, not yet around. Anxious to go home. Will advance diet. If well tolerated, will allow to go home after lunch. RX for Norco given to husband.

## 2015-06-24 DIAGNOSIS — D120 Benign neoplasm of cecum: Secondary | ICD-10-CM | POA: Diagnosis not present

## 2015-06-24 LAB — C DIFFICILE QUICK SCREEN W PCR REFLEX
C Diff antigen: NEGATIVE
C Diff interpretation: NEGATIVE
C Diff toxin: NEGATIVE

## 2015-06-24 LAB — CBC WITH DIFFERENTIAL/PLATELET
BASOS ABS: 0 10*3/uL (ref 0–0.1)
Basophils Relative: 1 %
EOS PCT: 3 %
Eosinophils Absolute: 0.2 10*3/uL (ref 0–0.7)
HCT: 29 % — ABNORMAL LOW (ref 35.0–47.0)
Hemoglobin: 9.6 g/dL — ABNORMAL LOW (ref 12.0–16.0)
LYMPHS ABS: 2.6 10*3/uL (ref 1.0–3.6)
Lymphocytes Relative: 33 %
MCH: 29.5 pg (ref 26.0–34.0)
MCHC: 33 g/dL (ref 32.0–36.0)
MCV: 89.3 fL (ref 80.0–100.0)
MONOS PCT: 7 %
Monocytes Absolute: 0.6 10*3/uL (ref 0.2–0.9)
NEUTROS ABS: 4.4 10*3/uL (ref 1.4–6.5)
NEUTROS PCT: 56 %
PLATELETS: 163 10*3/uL (ref 150–440)
RBC: 3.25 MIL/uL — AB (ref 3.80–5.20)
RDW: 15.4 % — AB (ref 11.5–14.5)
WBC: 7.8 10*3/uL (ref 3.6–11.0)

## 2015-06-24 LAB — BASIC METABOLIC PANEL
ANION GAP: 5 (ref 5–15)
BUN: 11 mg/dL (ref 6–20)
CO2: 25 mmol/L (ref 22–32)
Calcium: 8.7 mg/dL — ABNORMAL LOW (ref 8.9–10.3)
Chloride: 110 mmol/L (ref 101–111)
Creatinine, Ser: 0.96 mg/dL (ref 0.44–1.00)
GFR calc Af Amer: >60 mL/min (ref >60)
GFR calc non Af Amer: 60 mL/min — ABNORMAL LOW (ref >60)
Glucose, Bld: 84 mg/dL (ref 65–99)
POTASSIUM: 4 mmol/L (ref 3.5–5.1)
Sodium: 140 mmol/L (ref 135–145)

## 2015-06-24 MED ORDER — PROMETHAZINE HCL 6.25 MG/5ML PO SYRP
6.2500 mg | ORAL_SOLUTION | ORAL | Status: DC | PRN
  Administered 2015-06-24: 6.25 mg via ORAL
  Filled 2015-06-24 (×2): qty 5

## 2015-06-24 MED ORDER — ONDANSETRON 4 MG PO TBDP
4.0000 mg | ORAL_TABLET | ORAL | Status: DC | PRN
  Administered 2015-06-24 (×2): 4 mg via ORAL
  Filled 2015-06-24 (×4): qty 1

## 2015-06-24 MED ORDER — CODEINE SULFATE 30 MG PO TABS
60.0000 mg | ORAL_TABLET | Freq: Once | ORAL | Status: AC
  Administered 2015-06-24: 60 mg via ORAL
  Filled 2015-06-24: qty 2

## 2015-06-24 NOTE — Progress Notes (Signed)
N/V after po codiene. Plan: PO Zofran. If nausea controlled and she is able to take fluids, no IV. If not: restart IV fluids.

## 2015-06-24 NOTE — Progress Notes (Signed)
AVSS. Multiple loose BM's, some w/ BRB last PM, none since sunrise. Reports increased abdominal pain and nausea. Drinking well, ambulating well.  Decreased stool frequency after on dose of Lomotil last night. C diff: Neg. Awake, alert, orientated. Long history of intermittent diarrhea alternating w/ constipation since teenage years reviewed. Lungs: Clear. Cardio: RR. ABD: Non-distended, mild tenderness appropriate for POD #2. Wound: Clean. Ecchymosis RUQ from heparin injection. Mild bruising along extraction incision. External anal area: Negative. Rectal exam deferred.  CBC: significant fall in HGB. Normal WBC/ plat.  Reports increasing pain, but moves well, from restroom to bed. Fairly benign abdominal exam. Plan:  Patient reluctantly agreed to stay. She will work on po intake. Will modify analgesic regimen to avoid Tylenol excess. Repeat CBC in AM.

## 2015-06-24 NOTE — Progress Notes (Signed)
Pt nauseous and vomiting. Pt asked that nurse stay out of the room unless asked to come in. Pt did not want Dr. Bary Castilla to come either. RN notified Dr. Parks Ranger pt did not want the nurse in the room.

## 2015-06-25 DIAGNOSIS — D120 Benign neoplasm of cecum: Secondary | ICD-10-CM | POA: Diagnosis not present

## 2015-06-25 LAB — CBC WITH DIFFERENTIAL/PLATELET
BASOS ABS: 0 10*3/uL (ref 0–0.1)
BASOS PCT: 0 %
EOS PCT: 2 %
Eosinophils Absolute: 0.1 10*3/uL (ref 0–0.7)
HCT: 31.1 % — ABNORMAL LOW (ref 35.0–47.0)
Hemoglobin: 10 g/dL — ABNORMAL LOW (ref 12.0–16.0)
LYMPHS ABS: 2.2 10*3/uL (ref 1.0–3.6)
Lymphocytes Relative: 26 %
MCH: 29 pg (ref 26.0–34.0)
MCHC: 32.2 g/dL (ref 32.0–36.0)
MCV: 90 fL (ref 80.0–100.0)
MONO ABS: 0.5 10*3/uL (ref 0.2–0.9)
Monocytes Relative: 5 %
Neutro Abs: 5.9 10*3/uL (ref 1.4–6.5)
Neutrophils Relative %: 67 %
Platelets: 206 10*3/uL (ref 150–440)
RBC: 3.46 MIL/uL — ABNORMAL LOW (ref 3.80–5.20)
RDW: 15.7 % — ABNORMAL HIGH (ref 11.5–14.5)
WBC: 8.7 10*3/uL (ref 3.6–11.0)

## 2015-06-25 MED ORDER — HYDROCODONE-ACETAMINOPHEN 5-325 MG PO TABS: 1.0000 | ORAL_TABLET | ORAL | Status: DC | PRN

## 2015-06-25 MED ORDER — ONDANSETRON 4 MG PO TBDP: 4.0000 mg | ORAL_TABLET | ORAL | Status: DC | PRN

## 2015-06-25 NOTE — Progress Notes (Signed)
Tmax 100.4 yesterday afternoon, afebrile since. VSS. Occ sharp/ stabbing pains RLQ.  Stools less frequent, near normal color. HGB up at 10. WBC: Normal. Lungs: Clear. Inspirex at 750 ( 12 cc/ kg bases on weight). Cardio:RR. ABD: Non-distended, BS normal. Mild Right sided tenderness. Wounds: Clean. No extension of RUQ ecchymosis. Tolerating liquids well, OK w/ solids.  Ambulating well per RN report. Good u/o. Discussed w/ patient/ husband. Ready to go.   Will decrease narcotics as best she can. Reviewed need to heed max tylenol dose.

## 2015-06-25 NOTE — Care Management (Signed)
Important Message  Patient Details  Name: Elaine Clayton MRN: [id number] Date of Birth: February 15, 1946   Medicare Important Message Given:  Yes-second notification given    Alvie Heidelberg, RN 06/25/2015, 9:51 AM

## 2015-06-25 NOTE — Discharge Instructions (Signed)
Diet as tolerated.  Drink at least one quart of fluids daily. Heating pad to abdomen for comfort. Tylenol: If needed for soreness. Norco (hydrocodone): If needed for pain.  No more than 12 Norco tablets or Tylenol tablets (total) per day. OK to shower. Use incentive spirometer frequently at home. Goal: 1000 cc.

## 2015-06-27 LAB — SURGICAL PATHOLOGY

## 2015-06-29 ENCOUNTER — Ambulatory Visit
Admission: RE | Admit: 2015-06-29 | Discharge: 2015-06-29 | Disposition: A | Discharge status: Home / Self Care | Payer: Medicare PPO | Source: Ambulatory Visit | Attending: General Surgery | Admitting: General Surgery

## 2015-06-29 ENCOUNTER — Other Ambulatory Visit: Payer: Self-pay | Admitting: General Surgery

## 2015-06-29 DIAGNOSIS — Z1231 Encounter for screening mammogram for malignant neoplasm of breast: Secondary | ICD-10-CM

## 2015-07-04 ENCOUNTER — Encounter: Payer: Self-pay | Admitting: General Surgery

## 2015-07-04 ENCOUNTER — Ambulatory Visit (INDEPENDENT_AMBULATORY_CARE_PROVIDER_SITE_OTHER): Payer: Medicare PPO | Admitting: General Surgery

## 2015-07-04 VITALS — BP 120/64 | HR 66 | Resp 14 | Ht 64.0 in | Wt 97.0 lb

## 2015-07-04 DIAGNOSIS — K635 Polyp of colon: Secondary | ICD-10-CM

## 2015-07-04 DIAGNOSIS — Z853 Personal history of malignant neoplasm of breast: Secondary | ICD-10-CM

## 2015-07-04 NOTE — Progress Notes (Signed)
Patient ID: Elaine Clayton, female   DOB: Aug 27, 1946, 69 y.o.   MRN: [id number]  Chief Complaint  Patient presents with  . Follow-up    mammogram    HPI Elaine Clayton is a 69 y.o. female who presents for a breast evaluation. The most recent mammogram was done on 06/29/15. Patient does perform regular self breast checks and gets regular mammograms done.  Patient had Laparoscopic assisted resection of the terminal ileum and cecum done on 06/22/15 for removal of a large polyp. She states that she is doing well.  The patient had a great day at the time of her surgery and then had significant pain that hampered her recovery. This is improving. She is requiring less hydrocodone each day. She reports no recurrent diarrhea as present prior to surgery. Oral intake is still somewhat below her low baseline.  Activity level is improving. Spirits are excellent. She is accompanied today by her husband.  HPI  Past Medical History  Diagnosis Date  . Glaucoma   . Hypertension   . Pancreatitis   . Pituitary microadenoma   . COPD (chronic obstructive pulmonary disease)   . Personal history of malignant neoplasm of breast   . Broken foot 2015    right foot  . Lithium toxicity 2015  . Chronic kidney disease     Dr Holley Raring  . Bipolar affective disorder     2  . Osteoarthritis   . Renal insufficiency   . Anemia   . Hyperlipemia   . Recurrent UTI   . Dysphagia   . Stomach ulcer 2112  . Chronic gastritis   . 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 Colonoscopy  2004  . Neck surgery  2006  . Eye surgery  2013  . Appendectomy    . Tubal ligation    . Colonoscopy N/A 05/14/2015    Procedure: COLONOSCOPY;  Surgeon: Manya Silvas, MD;  Location: [facility] ENDOSCOPY;  Service: Endoscopy;  Laterality: N/A;  . Esophagogastroduodenoscopy N/A 05/14/2015    Procedure:  ESOPHAGOGASTRODUODENOSCOPY (EGD);  Surgeon: Manya Silvas, MD;  Location: [facility] ENDOSCOPY;  Service: Endoscopy;  Laterality: N/A;  . Savory dilation N/A 05/14/2015    Procedure: SAVORY DILATION;  Surgeon: Manya Silvas, MD;  Location: [facility] ENDOSCOPY;  Service: Endoscopy;  Laterality: N/A;  . Ercp N/A   . Glaucoma surgery Right 2013  . Cataract Right 2013  . Breast surgery Left  2013    left breast wide excision,Intraductal papilloma, ductal hyperplasia and sclerosing adenosis. Microcalcifications associated with columnar cell change. No evidence of atypia or malignancy. Margins are unremarkable.  . Breast surgery Right November 06, 1999    multiple areas of microcalcification showing evidence of sclerosing adenosis and ductal hyperplasia.  . Breast surgery Left May 07, 2007    wide excision.  . Eus N/A 2011, 2012  . Laparoscopic right colectomy Right 06/22/2015    Procedure: LAPAROSCOPIC RIGHT COLECTOMY;  Surgeon: Robert Bellow, MD;  Location: [facility] ORS;  Service: General;  Laterality: Right;  . Breast biopsy Right 2011    neg  . Breast excisional biopsy Right 2001    neg  . Breast excisional biopsy Right 2008    breast ca 2008 radiation  . Breast excisional biopsy Left 10/26/2012    neg  . Right breast cancer      Family History  Problem Relation Age of Onset  . Breast cancer Mother 31    Social History History  Substance Use Topics  . Smoking status: Former Smoker -- 1.00 packs/day for 35 years  . Smokeless tobacco: Never Used  . Alcohol Use: Yes    Allergies  Allergen Reactions  . Influenza Vaccines Anaphylaxis  . Flu Virus Vaccine Other (See Comments)    Muscle spams  . Sulfa Antibiotics Rash  . Tetracyclines & Related Rash    Current Outpatient Prescriptions  Medication Sig Dispense Refill  . Aclidinium Bromide 400 MCG/ACT AEPB Inhale 1 puff into the lungs 2 (two) times daily.     Marland Kitchen ALPRAZolam (XANAX) 0.5 MG tablet Take 0.5 mg by mouth 2 (two) times daily  as needed for sleep. 0.5-1 tablet    . amLODipine (NORVASC) 5 MG tablet Take 5 mg by mouth every morning.     . bimatoprost (LUMIGAN) 0.03 % ophthalmic solution Place 1 drop into both eyes at bedtime.    . bisacodyl (DULCOLAX) 5 MG EC tablet Take 5 mg by mouth daily as needed for moderate constipation.    . Brimonidine Tartrate-Timolol (COMBIGAN OP) Apply 1 drop to eye 2 (two) times daily.    . brinzolamide (AZOPT) 1 % ophthalmic suspension Place 1 drop into the left eye 2 (two) times daily.     Marland Kitchen buPROPion (WELLBUTRIN SR) 150 MG 12 hr tablet Take 150 mg by mouth every morning. 2 tablets    . calcium carbonate (TUMS EX) 750 MG chewable tablet Chew 1 tablet by mouth as needed for heartburn.    . Calcium Carbonate-Vitamin D (CALCIUM + D PO) Take 1 tablet by mouth 3 (three) times daily.    . CHLORDIAZEPOXIDE HCL PO Take 1 capsule by mouth 3 (three) times daily.    . Coenzyme Q10 (CO Q 10 PO) Take 1 tablet by mouth daily.    Marland Kitchen dextromethorphan 15 MG/5ML syrup Take 10 mLs by mouth 4 (four) times daily as needed for cough.    . diphenhydramine-acetaminophen (TYLENOL PM) 25-500 MG TABS Take 1 tablet by mouth at bedtime as needed.    Marland Kitchen esomeprazole (NEXIUM) 40 MG capsule Take 40 mg by mouth at bedtime as needed. As needed    . estradiol-levonorgestrel (CLIMARAPRO) 0.045-0.015 MG/DAY Place 1 patch onto the skin once a week. Changed on Sundays    . HYDROcodone-acetaminophen (NORCO/VICODIN) 5-325 MG per tablet Take 1-2 tablets by mouth every 4 (four) hours as needed for moderate pain. 40 tablet 0  . ipratropium (ATROVENT) 0.06 % nasal spray Place 2 sprays into the nose 3 (three) times daily.     Marland Kitchen loratadine (CLARITIN) 10 MG tablet Take 10 mg by mouth daily as needed for allergies.    Marland Kitchen losartan (COZAAR) 25 MG tablet Take 25 mg by mouth every morning.    . nicotine polacrilex (NICORETTE) 2 MG gum Take 2 mg by mouth as needed for smoking cessation.    . ondansetron (ZOFRAN-ODT) 4 MG disintegrating tablet  Take 1 tablet (4 mg total) by mouth every 4 (four) hours as needed for nausea or vomiting. 20 tablet 0  . rosuvastatin (CRESTOR) 10 MG tablet Take 10 mg by mouth at bedtime.     . Vitamins-Lipotropics (LIPOFLAVONOID PO) Take 2 tablets by mouth 2 (two) times daily as needed.     No current facility-administered medications for this visit.    Review of Systems Review of Systems  Constitutional: Negative.   Respiratory: Negative.   Cardiovascular: Negative.  Blood pressure 120/64, pulse 66, resp. rate 14, height 5\' 4"  (1.626 m), weight 97 lb (43.999 kg).  Physical Exam Physical Exam  Constitutional: She is oriented to person, place, and time. She appears well-developed and well-nourished.  HENT:  Mouth/Throat: Oropharynx is clear and moist. No oropharyngeal exudate.  Eyes: Conjunctivae are normal. No scleral icterus.  Neck: Neck supple.  Cardiovascular: Normal rate, regular rhythm and normal heart sounds.   Pulmonary/Chest: Effort normal and breath sounds normal. Right breast exhibits no inverted nipple, no mass, no nipple discharge, no skin change and no tenderness. Left breast exhibits no inverted nipple, no mass, no nipple discharge, no skin change and no tenderness.  Abdominal: Soft. Normal appearance. There is no tenderness.  Well healed colectomy site  Lymphadenopathy:    She has no cervical adenopathy.  Neurological: She is alert and oriented to person, place, and time.  Skin: Skin is warm and dry.  Psychiatric: She has a normal mood and affect.    Data Reviewed SPECIMEN SUBMITTED:  A. Cecum and terminal ileum, resection   CLINICAL HISTORY:  69 year old woman underwent a screening colonoscopy with identification  of a polyp on the ileocecal valve not felt to be amenable to endoscopic  resection.   PRE-OPERATIVE DIAGNOSIS:  Colon polyp   POST-OPERATIVE DIAGNOSIS:  Same as preop   DIAGNOSIS:  A. CECUM AND TERMINAL ILEUM; LAPAROSCOPIC ASSISTED RESECTION:  -  TUBULAR ADENOMA, 1.6 CM.  - 12 BENIGN LYMPH NODES.  - UNREMARKABLE PROXIMAL SMALL INTESTINAL AND DISTAL COLONIC MARGINS OF  RESECTION.  - NEGATIVE FOR HIGH-GRADE DYSPLASIA AND MALIGNANCY.   Bilateral mammograms dated 06/29/2015 were reviewed. Dense breasts are again identified. No interval change. BI-RADS-1.  Assessment    Study improvement status post resection of distal terminal ileum and cecum for a polyp involving the ileocecal valve.  Benign breast exam now 8 years status post treatment of a T1a carcinoma the right breast.    Plan    Need for improvement in her caloric intake was reviewed. She's never been a big eater which makes somewhat more difficult. Making use of high calorie and high protein sources was encouraged.     PCP:  Damaris Hippo 07/04/2015, 8:14 PM

## 2015-07-04 NOTE — Patient Instructions (Signed)
Increase dietary intake. Use Ensure or milkshakes.

## 2015-07-11 NOTE — Discharge Summary (Signed)
Physician Discharge Summary  Patient ID: Elaine Clayton MRN: [id number] DOB/AGE: [birth year] 69 y.o.  Admit date: 06/22/2015 Discharge date: 07/11/2015  Admission Diagnoses: Polyp of ileocecal valve.  Discharge Diagnoses: Same. Active Problems:   Colon polyp   Discharged Condition: good  Hospital Course: The patient had moderate pain on postoperative day 2 and 3 requiring narcotic use. This gradually resolved. She did ambulate early. Incentive spirometry use to minimize cardiopulmonary complication. Clinical exam remains stable.  Consults: None  Significant Diagnostic Studies: None  Treatments: None  Discharge Exam: Blood pressure 122/47, pulse 71, temperature 98.3 F (36.8 C), temperature source Oral, resp. rate 16, height 5' 4.5" (1.638 m), weight 100 lb (45.36 kg), SpO2 98 %. General appearance: alert  Cardiopulmonary exam was normal. Abdomen soft, minimal right lower quadrant tenderness. Primary excision site in the right lower quadrant healing well at time of discharge. No evidence of lower extremity DVT. Disposition: 01-Home or Self Care  Discharge Instructions    Diet general    Complete by:  As directed      Increase activity slowly    Complete by:  As directed      No dressing needed    Complete by:  As directed             Medication List    STOP taking these medications        acetaminophen 500 MG tablet  Commonly known as:  TYLENOL     amoxicillin 500 MG capsule  Commonly known as:  AMOXIL     Calcium-Vitamin D-Vitamin K 500-500-40 MG-UNT-MCG Chew     loperamide 2 MG capsule  Commonly known as:  IMODIUM     ondansetron 8 MG tablet  Commonly known as:  ZOFRAN     senna-docusate 8.6-50 MG per tablet  Commonly known as:  Senokot-S      TAKE these medications        Aclidinium Bromide 400 MCG/ACT Aepb  Inhale 1 puff into the lungs 2 (two) times daily.     ALPRAZolam 0.5 MG tablet  Commonly known as:  XANAX  Take 0.5 mg by mouth 2 (two) times  daily as needed for sleep. 0.5-1 tablet     amLODipine 5 MG tablet  Commonly known as:  NORVASC  Take 5 mg by mouth every morning.     bimatoprost 0.03 % ophthalmic solution  Commonly known as:  LUMIGAN  Place 1 drop into both eyes at bedtime.     bisacodyl 5 MG EC tablet  Commonly known as:  DULCOLAX  Take 5 mg by mouth daily as needed for moderate constipation.     brinzolamide 1 % ophthalmic suspension  Commonly known as:  AZOPT  Place 1 drop into the left eye 2 (two) times daily.     buPROPion 150 MG 12 hr tablet  Commonly known as:  WELLBUTRIN SR  Take 150 mg by mouth every morning. 2 tablets     CALCIUM + D PO  Take 1 tablet by mouth 3 (three) times daily.     calcium carbonate 750 MG chewable tablet  Commonly known as:  TUMS EX  Chew 1 tablet by mouth as needed for heartburn.     CHLORDIAZEPOXIDE HCL PO  Take 1 capsule by mouth 3 (three) times daily.     CO Q 10 PO  Take 1 tablet by mouth daily.     COMBIGAN OP  Apply 1 drop to eye 2 (two) times daily.  dextromethorphan 15 MG/5ML syrup  Take 10 mLs by mouth 4 (four) times daily as needed for cough.     diphenhydramine-acetaminophen 25-500 MG Tabs  Commonly known as:  TYLENOL PM  Take 1 tablet by mouth at bedtime as needed.     esomeprazole 40 MG capsule  Commonly known as:  NEXIUM  Take 40 mg by mouth at bedtime as needed. As needed     estradiol-levonorgestrel 0.045-0.015 MG/DAY  Commonly known as:  CLIMARAPRO  Place 1 patch onto the skin once a week. Changed on Sundays     HYDROcodone-acetaminophen 5-325 MG per tablet  Commonly known as:  NORCO/VICODIN  Take 1-2 tablets by mouth every 4 (four) hours as needed for moderate pain.     ipratropium 0.06 % nasal spray  Commonly known as:  ATROVENT  Place 2 sprays into the nose 3 (three) times daily.     LIPOFLAVONOID PO  Take 2 tablets by mouth 2 (two) times daily as needed.     loratadine 10 MG tablet  Commonly known as:  CLARITIN  Take 10 mg  by mouth daily as needed for allergies.     losartan 25 MG tablet  Commonly known as:  COZAAR  Take 25 mg by mouth every morning.     nicotine polacrilex 2 MG gum  Commonly known as:  NICORETTE  Take 2 mg by mouth as needed for smoking cessation.     ondansetron 4 MG disintegrating tablet  Commonly known as:  ZOFRAN-ODT  Take 1 tablet (4 mg total) by mouth every 4 (four) hours as needed for nausea or vomiting.     rosuvastatin 10 MG tablet  Commonly known as:  CRESTOR  Take 10 mg by mouth at bedtime.           Follow-up Information    Schedule an appointment as soon as possible for a visit with Bary Castilla Forest Gleason, MD.   Specialties:  General Surgery, Radiology   Why:  As scheduled the week of July 11th.    Contact information:   [address] [place] [address] [phone]       Signed: Robert Bellow 07/11/2015, 9:33 AM

## 2015-08-02 ENCOUNTER — Ambulatory Visit: Payer: Self-pay | Admitting: Urology

## 2015-08-15 ENCOUNTER — Ambulatory Visit (INDEPENDENT_AMBULATORY_CARE_PROVIDER_SITE_OTHER): Payer: Medicare PPO | Admitting: General Surgery

## 2015-08-15 ENCOUNTER — Encounter: Payer: Self-pay | Admitting: General Surgery

## 2015-08-15 VITALS — BP 142/84 | HR 84 | Resp 14 | Ht 64.0 in | Wt 95.0 lb

## 2015-08-15 DIAGNOSIS — K635 Polyp of colon: Secondary | ICD-10-CM

## 2015-08-15 DIAGNOSIS — R634 Abnormal weight loss: Secondary | ICD-10-CM | POA: Insufficient documentation

## 2015-08-15 NOTE — Patient Instructions (Addendum)
The patient is aware to call back for any questions or concerns. Follow up as scheduled July 2017.

## 2015-08-15 NOTE — Progress Notes (Signed)
Patient ID: Elaine Clayton, female   DOB: [birth year], 69 y.o.   MRN: [id number]  Chief Complaint  Patient presents with  . Routine Post Op    HPI Elaine Clayton is a 69 y.o. female.  Here today for her follow up, laparoscopic assisted resection of the terminal ileum and cecum done on 06/22/15 for removal of a large polyp. She states that she is doing well. Bowels are almost back to normal, little more constipation than normal. She has lost 2 pounds since last visit in July.  HPI  Past Medical History  Diagnosis Date  . Glaucoma   . Hypertension   . Pancreatitis   . Pituitary microadenoma   . COPD (chronic obstructive pulmonary disease)   . Personal history of malignant neoplasm of breast   . Broken foot 2015    right foot  . Lithium toxicity 2015  . Chronic kidney disease     Dr Holley Raring  . Bipolar affective disorder     2  . Osteoarthritis   . Renal insufficiency   . Anemia   . Hyperlipemia   . Recurrent UTI   . Dysphagia   . Stomach ulcer 2112  . Chronic gastritis   . 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 Colonoscopy  2004  . Neck surgery  2006  . Eye surgery  2013  . Appendectomy    . Tubal ligation    . Colonoscopy N/A 05/14/2015    Procedure: COLONOSCOPY;  Surgeon: Manya Silvas, MD;  Location: [facility] ENDOSCOPY;  Service: Endoscopy;  Laterality: N/A;  . Esophagogastroduodenoscopy N/A 05/14/2015    Procedure: ESOPHAGOGASTRODUODENOSCOPY (EGD);  Surgeon: Manya Silvas, MD;  Location: [facility] ENDOSCOPY;  Service: Endoscopy;  Laterality: N/A;  . Savory dilation N/A 05/14/2015    Procedure: SAVORY DILATION;  Surgeon: Manya Silvas, MD;  Location: [facility] ENDOSCOPY;  Service: Endoscopy;  Laterality: N/A;  . Ercp N/A   . Glaucoma surgery Right 2013  . Cataract Right 2013  . Breast surgery Left  2013    left breast wide excision,Intraductal  papilloma, ductal hyperplasia and sclerosing adenosis. Microcalcifications associated with columnar cell change. No evidence of atypia or malignancy. Margins are unremarkable.  . Breast surgery Right November 06, 1999    multiple areas of microcalcification showing evidence of sclerosing adenosis and ductal hyperplasia.  . Breast surgery Left May 07, 2007    wide excision.  . Eus N/A 2011, 2012  . Laparoscopic right colectomy Right 06/22/2015    Procedure: LAPAROSCOPIC RIGHT COLECTOMY;  Surgeon: Robert Bellow, MD;  Location: [facility] ORS;  Service: General;  Laterality: Right;  . Breast biopsy Right 2011    neg  . Breast excisional biopsy Right 2001    neg  . Breast excisional biopsy Right 2008    breast ca 2008 radiation  . Breast excisional biopsy Left 10/26/2012    neg  . Right breast cancer      Family History  Problem Relation Age of Onset  . Breast cancer Mother 28    Social History Social History  Substance Use Topics  . Smoking status: Former Smoker -- 1.00 packs/day for 35 years  . Smokeless tobacco: Never Used  . Alcohol Use: Yes    Allergies  Allergen Reactions  . Influenza Vaccines Anaphylaxis  . Flu Virus Vaccine Other (See Comments)  Muscle spams  . Sulfa Antibiotics Rash  . Tetracyclines & Related Rash    Current Outpatient Prescriptions  Medication Sig Dispense Refill  . Aclidinium Bromide 400 MCG/ACT AEPB Inhale 1 puff into the lungs 2 (two) times daily.     Marland Kitchen ALPRAZolam (XANAX) 0.5 MG tablet Take 0.5 mg by mouth 2 (two) times daily as needed for sleep. 0.5-1 tablet    . amLODipine (NORVASC) 5 MG tablet Take 5 mg by mouth every morning.     . bimatoprost (LUMIGAN) 0.03 % ophthalmic solution Place 1 drop into both eyes at bedtime.    . bisacodyl (DULCOLAX) 5 MG EC tablet Take 5 mg by mouth daily as needed for moderate constipation.    . Brimonidine Tartrate-Timolol (COMBIGAN OP) Apply 1 drop to eye 2 (two) times daily.    . brinzolamide (AZOPT) 1 %  ophthalmic suspension Place 1 drop into the left eye 2 (two) times daily.     Marland Kitchen buPROPion (WELLBUTRIN SR) 150 MG 12 hr tablet Take 150 mg by mouth every morning. 2 tablets    . calcium carbonate (TUMS EX) 750 MG chewable tablet Chew 1 tablet by mouth as needed for heartburn.    . Calcium Carbonate-Vitamin D (CALCIUM + D PO) Take 1 tablet by mouth 3 (three) times daily.    . CHLORDIAZEPOXIDE HCL PO Take 1 capsule by mouth 3 (three) times daily.    . Coenzyme Q10 (CO Q 10 PO) Take 1 tablet by mouth daily.    Marland Kitchen dextromethorphan 15 MG/5ML syrup Take 10 mLs by mouth 4 (four) times daily as needed for cough.    . diphenhydramine-acetaminophen (TYLENOL PM) 25-500 MG TABS Take 1 tablet by mouth at bedtime as needed.    Marland Kitchen esomeprazole (NEXIUM) 40 MG capsule Take 40 mg by mouth at bedtime as needed. As needed    . estradiol-levonorgestrel (CLIMARAPRO) 0.045-0.015 MG/DAY Place 1 patch onto the skin once a week. Changed on Sundays    . HYDROcodone-acetaminophen (NORCO/VICODIN) 5-325 MG per tablet Take 1-2 tablets by mouth every 4 (four) hours as needed for moderate pain. 40 tablet 0  . ipratropium (ATROVENT) 0.06 % nasal spray Place 2 sprays into the nose 3 (three) times daily.     Marland Kitchen loratadine (CLARITIN) 10 MG tablet Take 10 mg by mouth daily as needed for allergies.    Marland Kitchen losartan (COZAAR) 25 MG tablet Take 25 mg by mouth every morning.    . nicotine polacrilex (NICORETTE) 2 MG gum Take 2 mg by mouth as needed for smoking cessation.    . ondansetron (ZOFRAN-ODT) 4 MG disintegrating tablet Take 1 tablet (4 mg total) by mouth every 4 (four) hours as needed for nausea or vomiting. 20 tablet 0  . rosuvastatin (CRESTOR) 10 MG tablet Take 10 mg by mouth at bedtime.     . Vitamins-Lipotropics (LIPOFLAVONOID PO) Take 2 tablets by mouth 2 (two) times daily as needed.     No current facility-administered medications for this visit.    Review of Systems Review of Systems  Constitutional: Negative.   Respiratory:  Negative.   Cardiovascular: Negative.     Blood pressure 142/84, pulse 84, resp. rate 14, height 5\' 4"  (1.626 m), weight 95 lb (43.092 kg).  Physical Exam Physical Exam  Constitutional: She is oriented to person, place, and time. She appears well-developed and well-nourished.  HENT:  Mouth/Throat: Oropharynx is clear and moist.  Eyes: Conjunctivae are normal. No scleral icterus.  Neck: Neck supple.  Cardiovascular: Normal rate,  regular rhythm and normal heart sounds.   Pulmonary/Chest: Effort normal and breath sounds normal.  Abdominal: Soft. Bowel sounds are normal. There is no tenderness. No hernia.    Lymphadenopathy:    She has no cervical adenopathy.  Neurological: She is alert and oriented to person, place, and time.  Skin: Skin is warm and dry.  Psychiatric: Her behavior is normal.    Data Reviewed Pathology was benign.  Assessment    Doing well status post limited resection of the terminal ileum and right colon.  Modest weight loss since July. ( 2 pounds).    Plan    The patient will likely be recommended to have a follow-up colonoscopy in 3 years, this will be scheduled with Dr. Percell [place] office. We'll reassess her breasts in one year. The patient reports an excellent appetite and no difficulty with bowel function. I caloric meals were again encouraged.    Follow up as scheduled July 2017.   PCP:  Teena Irani 08/15/2015, 1:28 PM

## 2015-09-24 ENCOUNTER — Ambulatory Visit (INDEPENDENT_AMBULATORY_CARE_PROVIDER_SITE_OTHER): Payer: Medicare PPO | Admitting: Pulmonary Disease

## 2015-09-24 ENCOUNTER — Encounter: Payer: Self-pay | Admitting: Pulmonary Disease

## 2015-09-24 VITALS — BP 120/82 | HR 79 | Ht 64.5 in | Wt 98.0 lb

## 2015-09-24 DIAGNOSIS — J439 Emphysema, unspecified: Secondary | ICD-10-CM

## 2015-09-24 DIAGNOSIS — R636 Underweight: Secondary | ICD-10-CM

## 2015-09-24 NOTE — Patient Instructions (Signed)
You may try off your inhaler Elaine Clayton) and determine if your breathing symptoms get any worse. If not, you may remain off the inhaler I would like you to gain a little weight to give you a little more reserve in case you get sick or require surgery Keep up the good work with regard to regular exercise - gradually increase back to your prior level  I you are unable to have another flu shot, avoid contact with people who have respiratory infections (cold and flu) during the fall and winter Follow up in 4-6 weeks. We will obtain lung function tests and a chest xray prior to that visit and review the results together

## 2015-09-25 NOTE — Progress Notes (Signed)
PULMONARY CONSULT NOTE  Requesting MD/Service: Self referred Reason for consultation: COPD  Pt Profile:  69 F former smoker diagnosed with COPD in 2002 reportedly based on CXR findings. Has been tried on multiple inhalers with little improvement in symptoms. Overall, well -compensated   HPI:  69 F former smoker first diagnosed with COPD in 2002 based on CXR findings. She has been tried on several different inhalers with little discernible benefit. She is usual well compensated and keeps herself active but this past summer, she underwent laparotomy for resection of a colonic polyp and lost some of her compensation. She continues to walk for exercise. She quit smoking at age 69. Prior to that, she smoked up to 1.5 ppd all of her adult life. She has no chronic cough and has never had any hemoptysis. She denies unintentional weight loss or weight gain. She denies LE edema and calf tenderness  Past Medical History  Diagnosis Date  . Glaucoma   . Hypertension   . Pancreatitis   . Pituitary microadenoma ([place])   . COPD (chronic obstructive pulmonary disease) ([place])   . Personal history of malignant neoplasm of breast   . Broken foot 2015    right foot  . Lithium toxicity 2015  . Chronic kidney disease     Dr Holley Raring  . Bipolar affective disorder ([place])     2  . Osteoarthritis   . Renal insufficiency   . Anemia   . Hyperlipemia   . Recurrent UTI   . Dysphagia   . Stomach ulcer 2112  . Chronic gastritis   . 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 Colonoscopy  2004  . Neck surgery  2006  . Eye surgery  2013  . Appendectomy    . Tubal ligation    . Colonoscopy N/A 05/14/2015    Procedure: COLONOSCOPY;  Surgeon: Manya Silvas, MD;  Location: [facility] ENDOSCOPY;  Service: Endoscopy;  Laterality: N/A;  . Esophagogastroduodenoscopy N/A 05/14/2015    Procedure:  ESOPHAGOGASTRODUODENOSCOPY (EGD);  Surgeon: Manya Silvas, MD;  Location: [facility] ENDOSCOPY;  Service: Endoscopy;  Laterality: N/A;  . Savory dilation N/A 05/14/2015    Procedure: SAVORY DILATION;  Surgeon: Manya Silvas, MD;  Location: [facility] ENDOSCOPY;  Service: Endoscopy;  Laterality: N/A;  . Ercp N/A   . Glaucoma surgery Right 2013  . Cataract Right 2013  . Breast surgery Left  2013    left breast wide excision,Intraductal papilloma, ductal hyperplasia and sclerosing adenosis. Microcalcifications associated with columnar cell change. No evidence of atypia or malignancy. Margins are unremarkable.  . Breast surgery Right November 06, 1999    multiple areas of microcalcification showing evidence of sclerosing adenosis and ductal hyperplasia.  . Breast surgery Left May 07, 2007    wide excision.  . Eus N/A 2011, 2012  . Laparoscopic right colectomy Right 06/22/2015    Procedure: LAPAROSCOPIC RIGHT COLECTOMY;  Surgeon: Robert Bellow, MD;  Location: [facility] ORS;  Service: General;  Laterality: Right;  . Breast biopsy Right 2011    neg  . Breast excisional biopsy Right 2001    neg  . Breast excisional biopsy Right 2008    breast ca 2008 radiation  . Breast excisional biopsy Left 10/26/2012    neg  . Right breast cancer      MEDICATIONS: reviewed  Social History   Social History  .  Marital Status: Married    Spouse Name: N/A  . Number of Children: N/A  . Years of Education: N/A   Occupational History  . Not on file.   Social History Main Topics  . Smoking status: Former Smoker -- 1.00 packs/day for 35 years    Quit date: 12/23/2007  . Smokeless tobacco: Never Used  . Alcohol Use: 0.0 oz/week    0 Standard drinks or equivalent per week  . Drug Use: No  . Sexual Activity: Not on file   Other Topics Concern  . Not on file   Social History Narrative    Family History  Problem Relation Age of Onset  . Breast cancer Mother 81    ROS - as per HPI. Otherwise a detailed  ROS was negative  Filed Vitals:   09/24/15 1116 09/24/15 1117  BP:  120/82  Pulse:  79  Height: 5' 4.5" (1.638 m)   Weight: 98 lb (44.453 kg)   SpO2:  96%    EXAM:  Gen: thin, not cachectic, NAD HEENT: All WNL Neck: No JVD, no LAN Lungs: diffusely diminished BS, no adventitious sounds Cardiovascular: Reg, no M Abdomen: soft, NT, +BS Musculoskeletal: No C/C/E Neuro: no focal deficits  IMPRESSION:   COPD - likely moderate in severity. Likely predominantly emphysema. Little discernible benefit from inhaler medications. Overall, well compensated Former smoker Mildly to moderately underweight- this is a bad prognosticator in COPD   PLAN:  May try off Trudoza inhaler to see if her symptoms are adversely affected Recommended a concerted effort @ wt gain with goal weight around 105 # Return in 4-6 wks with PFTs and CXR  Merton Border, MD PCCM service Mobile [phone] Pager [phone]

## 2015-10-17 ENCOUNTER — Ambulatory Visit (INDEPENDENT_AMBULATORY_CARE_PROVIDER_SITE_OTHER): Payer: Medicare PPO | Admitting: Pulmonary Disease

## 2015-10-17 DIAGNOSIS — J439 Emphysema, unspecified: Secondary | ICD-10-CM

## 2015-10-17 LAB — PULMONARY FUNCTION TEST
DL/VA % pred: 46 %
DL/VA: 2.27 ml/min/mmHg/L
DLCO UNC: 9.59 ml/min/mmHg
DLCO unc % pred: 38 %
FEF 25-75 Post: 0.86 L/sec
FEF 25-75 Pre: 0.69 L/sec
FEF2575-%Change-Post: 23 %
FEF2575-%Pred-Post: 41 %
FEF2575-%Pred-Pre: 33 %
FEV1-%CHANGE-POST: 9 %
FEV1-%PRED-PRE: 52 %
FEV1-%Pred-Post: 57 %
FEV1-Post: 1.37 L
FEV1-Pre: 1.26 L
FEV1FVC-%Change-Post: 3 %
FEV1FVC-%Pred-Pre: 75 %
FEV6-%Change-Post: 10 %
FEV6-%PRED-PRE: 69 %
FEV6-%Pred-Post: 76 %
FEV6-POST: 2.31 L
FEV6-Pre: 2.09 L
FEV6FVC-%PRED-POST: 104 %
FEV6FVC-%Pred-Pre: 104 %
FVC-%Change-Post: 5 %
FVC-%PRED-POST: 73 %
FVC-%PRED-PRE: 69 %
FVC-POST: 2.31 L
FVC-PRE: 2.18 L
POST FEV6/FVC RATIO: 100 %
PRE FEV6/FVC RATIO: 100 %
Post FEV1/FVC ratio: 59 %
Pre FEV1/FVC ratio: 58 %
RV % PRED: 177 %
RV: 3.83 L
TLC % PRED: 120 %
TLC: 6.17 L

## 2015-10-17 NOTE — Progress Notes (Signed)
PFT performed today. 

## 2015-10-23 ENCOUNTER — Ambulatory Visit: Payer: Medicare PPO | Admitting: Pulmonary Disease

## 2015-10-25 ENCOUNTER — Ambulatory Visit (INDEPENDENT_AMBULATORY_CARE_PROVIDER_SITE_OTHER): Payer: Medicare PPO | Admitting: Pulmonary Disease

## 2015-10-25 ENCOUNTER — Encounter: Payer: Self-pay | Admitting: Pulmonary Disease

## 2015-10-25 VITALS — BP 118/62 | HR 78 | Ht 64.5 in | Wt 100.0 lb

## 2015-10-25 DIAGNOSIS — J438 Other emphysema: Secondary | ICD-10-CM | POA: Diagnosis not present

## 2015-10-25 MED ORDER — FLUTICASONE FUROATE-VILANTEROL 100-25 MCG/INH IN AEPB: 1.0000 | INHALATION_SPRAY | Freq: Every day | RESPIRATORY_TRACT | Status: AC

## 2015-10-25 MED ORDER — FLUTICASONE FUROATE-VILANTEROL 100-25 MCG/INH IN AEPB: 1.0000 | INHALATION_SPRAY | Freq: Every day | RESPIRATORY_TRACT | Status: DC

## 2015-10-25 NOTE — Patient Instructions (Signed)
Trial of Breo - one inhalation Follow up in 4 wks with Chest Xray @ that time

## 2015-10-28 NOTE — Progress Notes (Signed)
PULMONARY OFFICE FOLLOW UP - COPD  Smoking status: Remote:   Significant occupational/environmetal exposures: none  CXR findings:  Date:   Findings:     PFTs: 10/17/15   FVC:  2.18   69 % pred   FEV1: 1.26     52% pred   FEV1%:    58%   TLC: 6.17 120 % pred   RV:   3.83 177% pred   DlCO   38% pred   Current pulmonary medications: None  Oxygen: No  Other medical problems: Past Medical History  Diagnosis Date  . Glaucoma   . Hypertension   . Pancreatitis   . Pituitary microadenoma ([place])   . COPD (chronic obstructive pulmonary disease) ([place])   . Personal history of malignant neoplasm of breast   . Broken foot 2015    right foot  . Lithium toxicity 2015  . Chronic kidney disease     Dr Holley Raring  . Bipolar affective disorder ([place])     2  . Osteoarthritis   . Renal insufficiency   . Anemia   . Hyperlipemia   . Recurrent UTI   . Dysphagia   . Stomach ulcer 2112  . Chronic gastritis   . 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 Colonoscopy  2004  . Neck surgery  2006  . Eye surgery  2013  . Appendectomy    . Tubal ligation    . Colonoscopy N/A 05/14/2015    Procedure: COLONOSCOPY;  Surgeon: Manya Silvas, MD;  Location: [facility] ENDOSCOPY;  Service: Endoscopy;  Laterality: N/A;  . Esophagogastroduodenoscopy N/A 05/14/2015    Procedure: ESOPHAGOGASTRODUODENOSCOPY (EGD);  Surgeon: Manya Silvas, MD;  Location: [facility] ENDOSCOPY;  Service: Endoscopy;  Laterality: N/A;  . Savory dilation N/A 05/14/2015    Procedure: SAVORY DILATION;  Surgeon: Manya Silvas, MD;  Location: [facility] ENDOSCOPY;  Service: Endoscopy;  Laterality: N/A;  . Ercp N/A   . Glaucoma surgery Right 2013  . Cataract Right 2013  . Breast surgery Left  2013    left breast wide excision,Intraductal papilloma, ductal hyperplasia and sclerosing adenosis. Microcalcifications  associated with columnar cell change. No evidence of atypia or malignancy. Margins are unremarkable.  . Breast surgery Right November 06, 1999    multiple areas of microcalcification showing evidence of sclerosing adenosis and ductal hyperplasia.  . Breast surgery Left May 07, 2007    wide excision.  . Eus N/A 2011, 2012  . Laparoscopic right colectomy Right 06/22/2015    Procedure: LAPAROSCOPIC RIGHT COLECTOMY;  Surgeon: Robert Bellow, MD;  Location: [facility] ORS;  Service: General;  Laterality: Right;  . Breast biopsy Right 2011    neg  . Breast excisional biopsy Right 2001    neg  . Breast excisional biopsy Right 2008    breast ca 2008 radiation  . Breast excisional biopsy Left 10/26/2012    neg  . Right breast cancer       SUBJECTIVE Routine follow up  No worsening of symptoms since stopping [place]. Since last visit broke foot after dog pulled her down stairs. In walking boot  Dyspnea: Yes - Stable   Cough: No  Sputum: No Hemoptysis: No Chest pain: No LE edema: No   Freq of rescue MDI use: None   OBJECTIVE Filed Vitals:   10/25/15 1009  BP: 118/62  Pulse: 78  Height:  5' 4.5" (1.638 m)  Weight: 100 lb (45.36 kg)  SpO2: 100%    Gen: WDWN in NAD HEENT: All WNL Neck: NO LAN, no JVD noted Lungs: moderately diminished BS, hyperresonant throughout, no wheezes Cardiovascular: Reg, no M noted Abdomen: Soft, NT +BS Ext: no C/C/E   DATA: PFTs as above  IMPRESSION: COPD - emphysema. Moderate obstruction. Significant gas-trapping. 9% improvement in FEV1 after BD therapy   Without acute exacerbation  DISCUSSION: Did not seem to benefit from anticholinergic therapy  PLAN:  Trial of Breo 100/25 - one inhalation daily.  Encouraged to remain active to extent possible with walking boot F/U in 4 wks with CXR   Wilhelmina Mcardle, MD [place] [facility]/CCM

## 2015-11-21 ENCOUNTER — Other Ambulatory Visit: Payer: Self-pay | Admitting: *Deleted

## 2015-11-21 ENCOUNTER — Ambulatory Visit
Admission: RE | Admit: 2015-11-21 | Discharge: 2015-11-21 | Disposition: A | Discharge status: Home / Self Care | Payer: Medicare PPO | Source: Ambulatory Visit | Attending: Pulmonary Disease | Admitting: Pulmonary Disease

## 2015-11-21 DIAGNOSIS — J449 Chronic obstructive pulmonary disease, unspecified: Secondary | ICD-10-CM | POA: Diagnosis not present

## 2015-11-21 DIAGNOSIS — J439 Emphysema, unspecified: Secondary | ICD-10-CM | POA: Diagnosis present

## 2015-11-21 DIAGNOSIS — Z87891 Personal history of nicotine dependence: Secondary | ICD-10-CM | POA: Insufficient documentation

## 2015-11-23 ENCOUNTER — Ambulatory Visit (INDEPENDENT_AMBULATORY_CARE_PROVIDER_SITE_OTHER): Payer: Medicare PPO | Admitting: Pulmonary Disease

## 2015-11-23 ENCOUNTER — Encounter: Payer: Self-pay | Admitting: Pulmonary Disease

## 2015-11-23 VITALS — BP 112/64 | HR 72 | Ht 64.5 in | Wt 100.8 lb

## 2015-11-23 DIAGNOSIS — J438 Other emphysema: Secondary | ICD-10-CM

## 2015-11-23 MED ORDER — ALBUTEROL SULFATE HFA 108 (90 BASE) MCG/ACT IN AERS: 2.0000 | INHALATION_SPRAY | Freq: Four times a day (QID) | RESPIRATORY_TRACT | Status: DC | PRN

## 2015-11-25 NOTE — Progress Notes (Signed)
PULMONARY OFFICE FOLLOW UP - COPD  Smoking status: Remote Significant occupational/environmetal exposures: none  CXR findings:  Date:  11/21/15 Findings: mod-severe hyperinflation and hyperlucency  PFTs: 10/17/15   FVC:  2.18   69 % pred   FEV1: 1.26     52% pred   FEV1%:    58%   TLC: 6.17 120 % pred   RV:   3.83 177% pred   DlCO   38% pred   Current pulmonary medications: Incruse  Oxygen: No  Other medical problems: Past Medical History  Diagnosis Date  . Glaucoma   . Hypertension   . Pancreatitis   . Pituitary microadenoma ([place])   . COPD (chronic obstructive pulmonary disease) ([place])   . Personal history of malignant neoplasm of breast   . Broken foot 2015    right foot  . Lithium toxicity 2015  . Chronic kidney disease     Dr Holley Raring  . Bipolar affective disorder ([place])     2  . Osteoarthritis   . Renal insufficiency   . Anemia   . Hyperlipemia   . Recurrent UTI   . Dysphagia   . Stomach ulcer 2112  . Chronic gastritis   . 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 Colonoscopy  2004  . Neck surgery  2006  . Eye surgery  2013  . Appendectomy    . Tubal ligation    . Colonoscopy N/A 05/14/2015    Procedure: COLONOSCOPY;  Surgeon: Manya Silvas, MD;  Location: [facility] ENDOSCOPY;  Service: Endoscopy;  Laterality: N/A;  . Esophagogastroduodenoscopy N/A 05/14/2015    Procedure: ESOPHAGOGASTRODUODENOSCOPY (EGD);  Surgeon: Manya Silvas, MD;  Location: [facility] ENDOSCOPY;  Service: Endoscopy;  Laterality: N/A;  . Savory dilation N/A 05/14/2015    Procedure: SAVORY DILATION;  Surgeon: Manya Silvas, MD;  Location: [facility] ENDOSCOPY;  Service: Endoscopy;  Laterality: N/A;  . Ercp N/A   . Glaucoma surgery Right 2013  . Cataract Right 2013  . Breast surgery Left  2013    left breast wide excision,Intraductal papilloma, ductal hyperplasia and  sclerosing adenosis. Microcalcifications associated with columnar cell change. No evidence of atypia or malignancy. Margins are unremarkable.  . Breast surgery Right November 06, 1999    multiple areas of microcalcification showing evidence of sclerosing adenosis and ductal hyperplasia.  . Breast surgery Left May 07, 2007    wide excision.  . Eus N/A 2011, 2012  . Laparoscopic right colectomy Right 06/22/2015    Procedure: LAPAROSCOPIC RIGHT COLECTOMY;  Surgeon: Robert Bellow, MD;  Location: [facility] ORS;  Service: General;  Laterality: Right;  . Breast biopsy Right 2011    neg  . Breast excisional biopsy Right 2001    neg  . Breast excisional biopsy Right 2008    breast ca 2008 radiation  . Breast excisional biopsy Left 10/26/2012    neg  . Right breast cancer       SUBJECTIVE Routine follow up to assess response to trial of Breo. She believes that her breathing and exercise tolerance are modestly better on th Breo. No new complaints   Dyspnea: Yes - improved Cough: No  Sputum: No Hemoptysis: No Chest pain: No LE edema: No   Freq of rescue MDI use: None   OBJECTIVE Filed Vitals:   11/23/15 1126  BP: 112/64  Pulse: 72  Height:  5' 4.5" (1.638 m)  Weight: 100 lb 12.8 oz (45.723 kg)  SpO2: 100%    Gen: WDWN in NAD HEENT: All WNL Neck: NO LAN, no JVD noted Lungs: moderately diminished BS, hyperresonant throughout, no wheezes Cardiovascular: Reg, no M noted Abdomen: Soft, NT +BS Ext: no C/C/E   DATA: CXR as above  IMPRESSION: COPD - emphysema. Moderate obstruction. Significant gas-trapping. 9% improvement in FEV1 after BD therapy   No improvement with trial of anti-cholinergic inhaler  Symptomatically improved with Breo  DISCUSSION:  PLAN:  Cont Breo 100/25 - one inhalation daily.  F/U in 4 months   Wilhelmina Mcardle, MD [place] [facility]/CCM

## 2016-02-27 ENCOUNTER — Encounter: Payer: Self-pay | Admitting: *Deleted

## 2016-02-29 NOTE — Discharge Instructions (Signed)

## 2016-03-05 ENCOUNTER — Ambulatory Visit: Payer: Medicare Other | Admitting: Anesthesiology

## 2016-03-05 ENCOUNTER — Encounter
Admission: RE | Disposition: A | Discharge status: Home / Self Care | Payer: Self-pay | Source: Ambulatory Visit | Attending: Ophthalmology

## 2016-03-05 ENCOUNTER — Ambulatory Visit
Admission: RE | Admit: 2016-03-05 | Discharge: 2016-03-05 | Disposition: A | Discharge status: Home / Self Care | Payer: Medicare Other | Source: Ambulatory Visit | Attending: Ophthalmology | Admitting: Ophthalmology

## 2016-03-05 ENCOUNTER — Encounter: Payer: Self-pay | Admitting: *Deleted

## 2016-03-05 DIAGNOSIS — N183 Chronic kidney disease, stage 3 (moderate): Secondary | ICD-10-CM | POA: Diagnosis not present

## 2016-03-05 DIAGNOSIS — H401120 Primary open-angle glaucoma, left eye, stage unspecified: Secondary | ICD-10-CM | POA: Insufficient documentation

## 2016-03-05 DIAGNOSIS — H9190 Unspecified hearing loss, unspecified ear: Secondary | ICD-10-CM | POA: Insufficient documentation

## 2016-03-05 DIAGNOSIS — K219 Gastro-esophageal reflux disease without esophagitis: Secondary | ICD-10-CM | POA: Insufficient documentation

## 2016-03-05 DIAGNOSIS — M8580 Other specified disorders of bone density and structure, unspecified site: Secondary | ICD-10-CM | POA: Insufficient documentation

## 2016-03-05 DIAGNOSIS — M1990 Unspecified osteoarthritis, unspecified site: Secondary | ICD-10-CM | POA: Insufficient documentation

## 2016-03-05 DIAGNOSIS — Z8711 Personal history of peptic ulcer disease: Secondary | ICD-10-CM | POA: Insufficient documentation

## 2016-03-05 DIAGNOSIS — Z86010 Personal history of colonic polyps: Secondary | ICD-10-CM | POA: Diagnosis not present

## 2016-03-05 DIAGNOSIS — I209 Angina pectoris, unspecified: Secondary | ICD-10-CM | POA: Insufficient documentation

## 2016-03-05 DIAGNOSIS — I129 Hypertensive chronic kidney disease with stage 1 through stage 4 chronic kidney disease, or unspecified chronic kidney disease: Secondary | ICD-10-CM | POA: Insufficient documentation

## 2016-03-05 DIAGNOSIS — Z853 Personal history of malignant neoplasm of breast: Secondary | ICD-10-CM | POA: Diagnosis not present

## 2016-03-05 DIAGNOSIS — E7800 Pure hypercholesterolemia, unspecified: Secondary | ICD-10-CM | POA: Diagnosis not present

## 2016-03-05 DIAGNOSIS — H2512 Age-related nuclear cataract, left eye: Secondary | ICD-10-CM | POA: Insufficient documentation

## 2016-03-05 DIAGNOSIS — Z882 Allergy status to sulfonamides status: Secondary | ICD-10-CM | POA: Insufficient documentation

## 2016-03-05 DIAGNOSIS — F319 Bipolar disorder, unspecified: Secondary | ICD-10-CM | POA: Insufficient documentation

## 2016-03-05 DIAGNOSIS — Z9841 Cataract extraction status, right eye: Secondary | ICD-10-CM | POA: Diagnosis not present

## 2016-03-05 DIAGNOSIS — Z87891 Personal history of nicotine dependence: Secondary | ICD-10-CM | POA: Insufficient documentation

## 2016-03-05 DIAGNOSIS — J449 Chronic obstructive pulmonary disease, unspecified: Secondary | ICD-10-CM | POA: Insufficient documentation

## 2016-03-05 DIAGNOSIS — Z881 Allergy status to other antibiotic agents status: Secondary | ICD-10-CM | POA: Diagnosis not present

## 2016-03-05 HISTORY — DX: Torticollis: M43.6

## 2016-03-05 HISTORY — DX: Dizziness and giddiness: R42

## 2016-03-05 HISTORY — PX: CATARACT EXTRACTION W/PHACO: SHX586

## 2016-03-05 HISTORY — PX: TRABECULECTOMY: SHX107

## 2016-03-05 SURGERY — TRABECULECTOMY
Anesthesia: Monitor Anesthesia Care | Site: Eye | Laterality: Left | Wound class: Clean

## 2016-03-05 MED ORDER — TIMOLOL MALEATE 0.5 % OP SOLN: OPHTHALMIC | Status: DC | PRN

## 2016-03-05 MED ORDER — POVIDONE-IODINE 5 % OP SOLN
1.0000 "application " | OPHTHALMIC | Status: DC | PRN
  Administered 2016-03-05: 1 via OPHTHALMIC

## 2016-03-05 MED ORDER — LIDOCAINE HCL (PF) 4 % IJ SOLN
INTRAMUSCULAR | Status: DC | PRN
  Administered 2016-03-05: 3.5 mL via OPHTHALMIC

## 2016-03-05 MED ORDER — NEOMYCIN-POLYMYXIN-DEXAMETH 3.5-10000-0.1 OP OINT
TOPICAL_OINTMENT | OPHTHALMIC | Status: DC | PRN
  Administered 2016-03-05: 1 via OPHTHALMIC

## 2016-03-05 MED ORDER — BSS IO SOLN
INTRAOCULAR | Status: DC | PRN
  Administered 2016-03-05: 30 mL via INTRAOCULAR

## 2016-03-05 MED ORDER — EPINEPHRINE HCL 1 MG/ML IJ SOLN
INTRAOCULAR | Status: DC | PRN
  Administered 2016-03-05: 68 mL via OPHTHALMIC

## 2016-03-05 MED ORDER — ARMC OPHTHALMIC DILATING GEL
1.0000 "application " | OPHTHALMIC | Status: DC | PRN
  Administered 2016-03-05 (×2): 1 via OPHTHALMIC

## 2016-03-05 MED ORDER — CEFUROXIME OPHTHALMIC INJECTION 1 MG/0.1 ML
INJECTION | OPHTHALMIC | Status: DC | PRN
  Administered 2016-03-05: 0.1 mL via INTRACAMERAL

## 2016-03-05 MED ORDER — LACTATED RINGERS IV SOLN: INTRAVENOUS | Status: DC

## 2016-03-05 MED ORDER — NA HYALUR & NA CHOND-NA HYALUR 0.4-0.35 ML IO KIT
PACK | INTRAOCULAR | Status: DC | PRN
  Administered 2016-03-05: 1 mL via INTRAOCULAR

## 2016-03-05 MED ORDER — FENTANYL CITRATE (PF) 100 MCG/2ML IJ SOLN
INTRAMUSCULAR | Status: DC | PRN
  Administered 2016-03-05: 50 ug via INTRAVENOUS

## 2016-03-05 MED ORDER — ACETAMINOPHEN 325 MG PO TABS: 325.0000 mg | ORAL_TABLET | ORAL | Status: DC | PRN

## 2016-03-05 MED ORDER — MIDAZOLAM HCL 2 MG/2ML IJ SOLN
INTRAMUSCULAR | Status: DC | PRN
  Administered 2016-03-05: 2 mg via INTRAVENOUS

## 2016-03-05 MED ORDER — BRIMONIDINE TARTRATE 0.2 % OP SOLN: OPHTHALMIC | Status: DC | PRN

## 2016-03-05 MED ORDER — PROPOFOL 10 MG/ML IV BOLUS
INTRAVENOUS | Status: DC | PRN
  Administered 2016-03-05: 60 mg via INTRAVENOUS

## 2016-03-05 MED ORDER — TETRACAINE HCL 0.5 % OP SOLN
1.0000 [drp] | OPHTHALMIC | Status: DC | PRN
  Administered 2016-03-05: 1 [drp] via OPHTHALMIC

## 2016-03-05 MED ORDER — MITOMYCIN 0.2 MG OP KIT
PACK | OPHTHALMIC | Status: DC | PRN
  Administered 2016-03-05: .4 mL via OPHTHALMIC

## 2016-03-05 MED ORDER — ACETAMINOPHEN 160 MG/5ML PO SOLN: 325.0000 mg | ORAL | Status: DC | PRN

## 2016-03-05 SURGICAL SUPPLY — 37 items
BANDAGE EYE OVAL (MISCELLANEOUS) ×6 IMPLANT
BLADE MINI RND TIP GREEN BEAV (BLADE) ×3 IMPLANT
CANNULA ANT/CHMB 27GA (MISCELLANEOUS) ×3 IMPLANT
CARTRIDGE ABBOTT (MISCELLANEOUS) ×3 IMPLANT
CORD BIP STRL DISP 12FT (MISCELLANEOUS) ×3 IMPLANT
CUP MEDICINE 2OZ PLAST GRAD ST (MISCELLANEOUS) IMPLANT
GLOVE BIO SURGEON STRL SZ7.5 (GLOVE) ×3 IMPLANT
GLOVE SURG LX 7.5 STRW (GLOVE) ×2
GLOVE SURG LX STRL 7.5 STRW (GLOVE) ×1 IMPLANT
GLOVE SURG TRIUMPH 8.0 PF LTX (GLOVE) ×3 IMPLANT
GOWN STRL REUS W/ TWL LRG LVL3 (GOWN DISPOSABLE) ×2 IMPLANT
GOWN STRL REUS W/TWL LRG LVL3 (GOWN DISPOSABLE) ×4
KNIFE SIDECUT EYE (MISCELLANEOUS) IMPLANT
LENS IOL TECNIS ITEC 22.0 (Intraocular Lens) ×3 IMPLANT
MARKER SKIN DUAL TIP RULER LAB (MISCELLANEOUS) ×3 IMPLANT
NDL RETROBULBAR .5 NSTRL (NEEDLE) ×3 IMPLANT
NEEDLE FILTER BLUNT 18X 1/2SAF (NEEDLE) ×2
NEEDLE FILTER BLUNT 18X1 1/2 (NEEDLE) ×1 IMPLANT
NEEDLE HYPO 26X3/8 (NEEDLE) IMPLANT
PACK CATARACT BRASINGTON (MISCELLANEOUS) ×3 IMPLANT
PACK EYE AFTER SURG (MISCELLANEOUS) ×3 IMPLANT
PACK OPTHALMIC (MISCELLANEOUS) ×3 IMPLANT
PROTECTOR LASIK FLAP (MISCELLANEOUS) ×3 IMPLANT
RING MALYGIN 7.0 (MISCELLANEOUS) IMPLANT
SHUNT EXPRESS GLAUCOMA MINI (Shunt) ×3 IMPLANT
SPONGE SURG I SPEAR (MISCELLANEOUS) ×9 IMPLANT
SUT ETHILON 10-0 CS-B-6CS-B-6 (SUTURE) ×3
SUT VICRYL  9 0 (SUTURE) ×2
SUT VICRYL 9 0 (SUTURE) ×1 IMPLANT
SUTURE EHLN 10-0 CS-B-6CS-B-6 (SUTURE) ×1 IMPLANT
SYR 3ML LL SCALE MARK (SYRINGE) ×3 IMPLANT
SYR 5ML LL (SYRINGE) IMPLANT
SYR TB 1ML LUER SLIP (SYRINGE) ×3 IMPLANT
SYRINGE 10CC LL (SYRINGE) ×3 IMPLANT
WATER STERILE IRR 250ML POUR (IV SOLUTION) ×3 IMPLANT
WATER STERILE IRR 500ML POUR (IV SOLUTION) ×3 IMPLANT
WIPE NON LINTING 3.25X3.25 (MISCELLANEOUS) ×3 IMPLANT

## 2016-03-05 NOTE — Op Note (Signed)
LOCATION:  [place]  PREOPERATIVE DIAGNOSIS:  Nuclear sclerotic cataract left eye.  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  [id number]  POSTOPERATIVE DIAGNOSIS:  Nuclear sclerotic cataract left eye.  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  PROCEDURE:  Trabeculectomy with mitomycin C and placement of Express shunt left eye. Phacoemulsification with posterior chamber intraocular lens implantation of the left eye.   LENS:  Implant Name Type Inv. Item Serial No. Manufacturer Lot No. LRB No. Used  SHUNT EXPRESS GLAUCOMA MINI - [id number] Shunt SHUNT EXPRESS GLAUCOMA MINI  OPTONOL [id number] Left 1  technis aspheric iol pre loaded     [id number] [facility]   Left 1  PCB00 22.0 D PCIOL   ULTRASOUND TIME:  13 of 1 minutes 35 seconds, CDE 12.6   MITOMYCIN TIME: 1.5 minutes of 0.04% mitomycin C.  SURGEON:  Wyonia Hough, MD   ANESTHESIA:  Retrobulbar block of Xylocaine and Bupivacaine.   COMPLICATIONS:  None.  ESTIMATED BLOOD LOSS: Less than 1 mL.   DESCRIPTION OF PROCEDURE:  The patient was identified in the holding room and transported to the operating room and placed in the supine position under the operating microscope.  The left eye was identified as the operative eye and a retrobulbar block was performed under intravenous sedation.  It was then prepped and draped in the usual sterile ophthalmic fashion.   A 1 millimeter clear-corneal paracentesis was made at the 4:30 position.  The anterior chamber was filled with Viscoat viscoelastic.  A 2.4 millimeter keratome was used to make a near-clear corneal incision at the 1:30 position.  A curvilinear capsulorrhexis was made with a cystotome and capsulorrhexis forceps.  Balanced salt solution was used to hydrodissect and hydrodelineate the nucleus.   Phacoemulsification was then used in stop and chop fashion to remove the lens nucleus and epinucleus.  The remaining cortex was then removed using the  irrigation and aspiration handpiece.  Provisc was then placed into the capsular bag to distend it for lens placement.  A 22.0 -diopter lens was then injected into the capsular bag.  The remaining viscoelastic was aspirated. A 10-0 nylon suture was placed through the center of the 2.4 mm incision.    A conjunctival peritomy was made from the 10 o'clock to the 12 o'clock position with Westcott scissors approximately 2 mm posterior to the limbus. Hemostasis was achieved with Wet-Field cautery. Careful dissection of the tenons and conjunctiva were done using Westcott scissors. Mitomycin-soaked pledgets with 0.04% mitomycin were then placed on the scleral bed for a period of 90 seconds . This area was then copiously irrigated with balanced salt solution.  A partial-thickness trapezoidal-shaped scleral flap was made at the 11 o'clock position. This was dissected forward to clear cornea. A 26-gauge needle was then used to enter the anterior chamber parallel to the iris underneath the anterior portion of the scleral flap. An Express shunt version P50 was then placed through the opening into the anterior chamber. This was noted to be parallel with the iris without corneal or iris touch. The flap was sutured initially with two posterior 10-0 nylon sutures. The anterior chamber was filled with balanced salt solution to remove the remaining Provisc. An additional 3 10-0 nylon sutures were placed on the sides of the flap. There was adequate but not excess spontaneous flow from the flap. The conjunctiva was then closed with running 9-0 Vicryl suture.   The paracentesis incision was hydrated with balanced salt solution to ensure a  tight wound without wound leak. The eye was inflated above physiologic pressure. Spontaneous bleb formation was noted. There was no conjunctival wound leak with bleb formation. Cefuroxime 0.1 ml of a 10mg /ml solution was injected into the anterior chamber for a dose of 1 mg of intracameral  antibiotic at the completion of the case.  Topical  erythromycin ointment were applied to the eye. The eye was patched and shielded. The patient was taken to the recovery room in stable condition.    Dedrick Heffner 03/05/2016, 12:26 PM

## 2016-03-05 NOTE — Anesthesia Postprocedure Evaluation (Signed)
Anesthesia Post Note  Patient: Elaine Clayton  Procedure(s) Performed: Procedure(s) (LRB): TRABECULECTOMY WITH [facility] AND EXPRESS SHUNT (Left) CATARACT EXTRACTION PHACO AND INTRAOCULAR LENS PLACEMENT (IOC) (Left)  Patient location during evaluation: PACU Anesthesia Type: MAC Level of consciousness: awake and alert and oriented Pain management: satisfactory to patient Vital Signs Assessment: post-procedure vital signs reviewed and stable Respiratory status: spontaneous breathing, nonlabored ventilation and respiratory function stable Cardiovascular status: blood pressure returned to baseline and stable Postop Assessment: Adequate PO intake and No signs of nausea or vomiting Anesthetic complications: no    Raliegh Ip

## 2016-03-05 NOTE — Anesthesia Procedure Notes (Signed)
Procedure Name: MAC Performed by: Eleftheria Taborn Pre-anesthesia Checklist: Patient identified, Emergency Drugs available, Suction available, Timeout performed and Patient being monitored Patient Re-evaluated:Patient Re-evaluated prior to inductionOxygen Delivery Method: Nasal cannula Placement Confirmation: positive ETCO2     

## 2016-03-05 NOTE — H&P (Signed)
  The History and Physical notes are on paper, have been signed, and are to be scanned. The patient remains stable and unchanged from the H&P.   Previous H&P reviewed, patient examined, and there are no changes.  Elaine Clayton 03/05/2016 11:22 AM

## 2016-03-05 NOTE — Transfer of Care (Signed)
Immediate Anesthesia Transfer of Care Note  Patient: Elaine Clayton  Procedure(s) Performed: Procedure(s): TRABECULECTOMY WITH [facility] AND EXPRESS SHUNT (Left) CATARACT EXTRACTION PHACO AND INTRAOCULAR LENS PLACEMENT (IOC) (Left)  Patient Location: PACU  Anesthesia Type: MAC  Level of Consciousness: awake, alert  and patient cooperative  Airway and Oxygen Therapy: Patient Spontanous Breathing and Patient connected to supplemental oxygen  Post-op Assessment: Post-op Vital signs reviewed, Patient's Cardiovascular Status Stable, Respiratory Function Stable, Patent Airway and No signs of Nausea or vomiting  Post-op Vital Signs: Reviewed and stable  Complications: No apparent anesthesia complications

## 2016-03-05 NOTE — Anesthesia Preprocedure Evaluation (Signed)
Anesthesia Evaluation  Patient identified by MRN, date of birth, ID band  Reviewed: Allergy & Precautions, H&P , NPO status , Patient's Chart, lab work & pertinent test results  Airway Mallampati: III  TM Distance: >3 FB Neck ROM: full    Dental no notable dental hx.    Pulmonary COPD, former smoker,    Pulmonary exam normal        Cardiovascular hypertension,  Rhythm:regular Rate:Normal     Neuro/Psych PSYCHIATRIC DISORDERS    GI/Hepatic   Endo/Other    Renal/GU Renal disease     Musculoskeletal   Abdominal   Peds  Hematology   Anesthesia Other Findings   Reproductive/Obstetrics                             Anesthesia Physical Anesthesia Plan  ASA: III  Anesthesia Plan: MAC   Post-op Pain Management:    Induction:   Airway Management Planned:   Additional Equipment:   Intra-op Plan:   Post-operative Plan:   Informed Consent: I have reviewed the patients History and Physical, chart, labs and discussed the procedure including the risks, benefits and alternatives for the proposed anesthesia with the patient or authorized representative who has indicated his/her understanding and acceptance.     Plan Discussed with: CRNA  Anesthesia Plan Comments:         Anesthesia Quick Evaluation

## 2016-03-06 ENCOUNTER — Encounter: Payer: Self-pay | Admitting: Ophthalmology

## 2016-03-25 ENCOUNTER — Encounter: Payer: Self-pay | Admitting: Pulmonary Disease

## 2016-03-25 ENCOUNTER — Ambulatory Visit (INDEPENDENT_AMBULATORY_CARE_PROVIDER_SITE_OTHER): Payer: Medicare Other | Admitting: Pulmonary Disease

## 2016-03-25 DIAGNOSIS — J439 Emphysema, unspecified: Secondary | ICD-10-CM

## 2016-03-25 NOTE — Progress Notes (Signed)
PULMONARY OFFICE FOLLOW UP - COPD  Smoking status: Remote Significant occupational/environmetal exposures: none  CXR findings:  Date:  11/21/15 Findings: mod-severe hyperinflation and hyperlucency  PFTs: 10/17/15   FVC:  2.18   69 % pred   FEV1: 1.26     52% pred   FEV1%:    58%   TLC: 6.17 120 % pred   RV:   3.83 177% pred   DlCO   38% pred   Current pulmonary medications: Breo  Oxygen: No  Other medical problems: Past Medical History  Diagnosis Date  . Glaucoma   . Pancreatitis   . Pituitary microadenoma ([place])   . COPD (chronic obstructive pulmonary disease) ([place])   . Personal history of malignant neoplasm of breast   . Broken foot 2015    right foot  . Lithium toxicity 2015  . Chronic kidney disease     Dr Holley Raring  . Bipolar affective disorder ([place])     2  . Renal insufficiency   . Anemia   . Hyperlipemia   . Recurrent UTI   . Dysphagia   . Stomach ulcer 2112  . Chronic gastritis   . 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 Hypertension     pt has recently come off BP meds.  MD aware. BP seems stable.  . Osteoarthritis     back  . Neck stiffness     s/p C3-C4 fusion, limited right turn  . Dizziness     inner ear (per pt) several times per week   Past Surgical History  Procedure Laterality Date  . Colonoscopy  2004  . Neck surgery  2006  . Eye surgery  2013  . Appendectomy    . Tubal ligation    . Colonoscopy N/A 05/14/2015    Procedure: COLONOSCOPY;  Surgeon: Manya Silvas, MD;  Location: [facility] ENDOSCOPY;  Service: Endoscopy;  Laterality: N/A;  . Esophagogastroduodenoscopy N/A 05/14/2015    Procedure: ESOPHAGOGASTRODUODENOSCOPY (EGD);  Surgeon: Manya Silvas, MD;  Location: [facility] ENDOSCOPY;  Service: Endoscopy;  Laterality: N/A;  . Savory dilation N/A 05/14/2015    Procedure: SAVORY DILATION;  Surgeon: Manya Silvas, MD;  Location: [facility] ENDOSCOPY;  Service: Endoscopy;   Laterality: N/A;  . Ercp N/A   . Glaucoma surgery Right 2013  . Cataract Right 2013  . Breast surgery Left  2013    left breast wide excision,Intraductal papilloma, ductal hyperplasia and sclerosing adenosis. Microcalcifications associated with columnar cell change. No evidence of atypia or malignancy. Margins are unremarkable.  . Breast surgery Right November 06, 1999    multiple areas of microcalcification showing evidence of sclerosing adenosis and ductal hyperplasia.  . Breast surgery Left May 07, 2007    wide excision.  . Eus N/A 2011, 2012  . Laparoscopic right colectomy Right 06/22/2015    Procedure: LAPAROSCOPIC RIGHT COLECTOMY;  Surgeon: Robert Bellow, MD;  Location: [facility] ORS;  Service: General;  Laterality: Right;  . Breast biopsy Right 2011    neg  . Breast excisional biopsy Right 2001    neg  . Breast excisional biopsy Right 2008    breast ca 2008 radiation  . Breast excisional biopsy Left 10/26/2012    neg  . Right breast cancer    . Trabeculectomy Left 03/05/2016    Procedure: TRABECULECTOMY WITH [facility] AND EXPRESS SHUNT;  Surgeon: Leandrew Koyanagi, MD;  Location: [place];  Service:  Ophthalmology;  Laterality: Left;  . Cataract extraction w/phaco Left 03/05/2016    Procedure: CATARACT EXTRACTION PHACO AND INTRAOCULAR LENS PLACEMENT (IOC);  Surgeon: Leandrew Koyanagi, MD;  Location: [place];  Service: Ophthalmology;  Laterality: Left;     SUBJECTIVE No new complaints. Remains active with only mild DOE.   Dyspnea: Mild Cough: No  Sputum: No Hemoptysis: No Chest pain: No LE edema: No   Freq of rescue MDI use: None   OBJECTIVE Filed Vitals:   03/25/16 1215  BP: 122/86  Pulse: 81  Height: 5' 4.5" (1.638 m)  Weight: 98 lb 12.8 oz (44.815 kg)  SpO2: 98%    Gen: WDWN in NAD HEENT: All WNL Neck: NO LAN, no JVD noted Lungs: moderately diminished BS, hyperresonant to percussion, no wheezes Cardiovascular: Reg, no M noted Abdomen:  Soft, NT +BS Ext: no C/C/E   DATA: No new film  IMPRESSION: COPD - emphysema. Moderate obstruction. Significant gas-trapping. 9% improvement in FEV1 after BD therapy   Symptomatically improved with Breo  DISCUSSION:  PLAN:  Cont Breo 100/25 - one inhalation daily.  F/U in 12 months   Wilhelmina Mcardle, MD [place] [facility]

## 2016-04-16 ENCOUNTER — Other Ambulatory Visit: Payer: Self-pay

## 2016-04-16 DIAGNOSIS — Z853 Personal history of malignant neoplasm of breast: Secondary | ICD-10-CM

## 2016-05-11 IMAGING — MG MM SCREENING BREAST TOMO BILATERAL
9 of 14 series · 9 of 30 positions shown · non-contrast
Comparison: Previous exam(s).

CLINICAL DATA: Screening.

EXAM:
DIGITAL SCREENING BILATERAL MAMMOGRAM WITH 3D TOMO WITH CAD

[R AT]
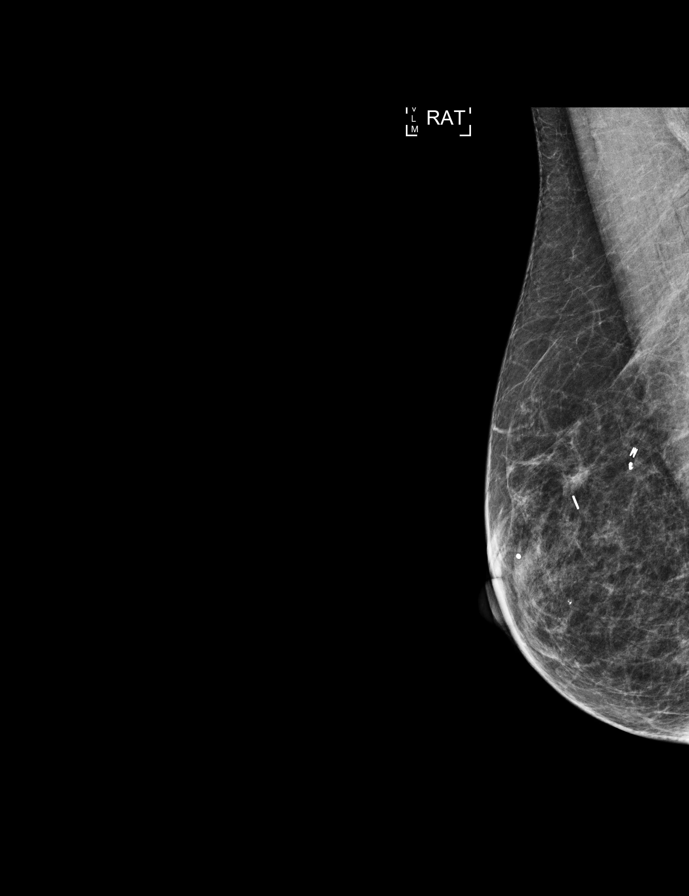

[L XCCL]
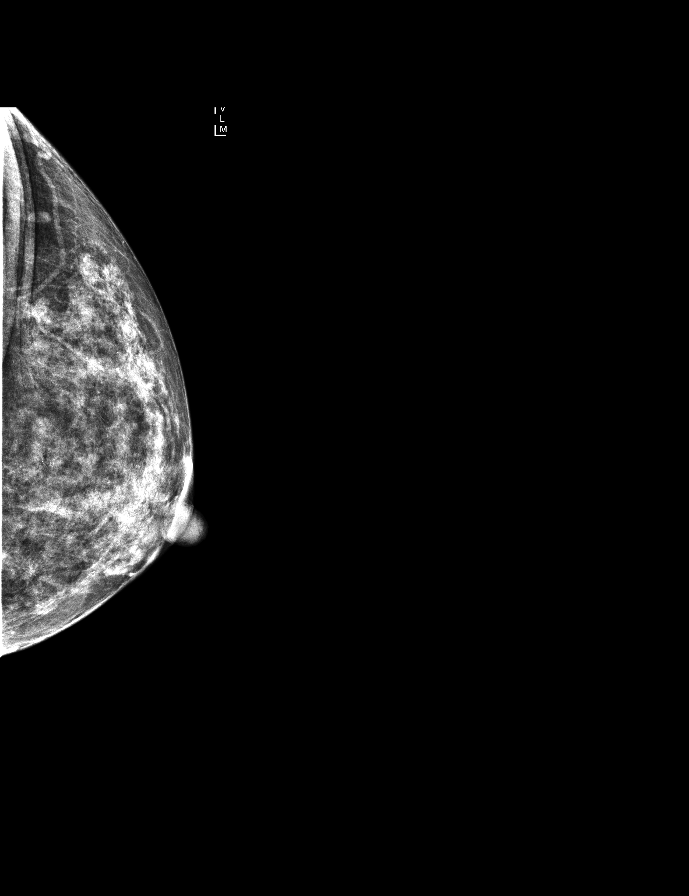

[L CC]
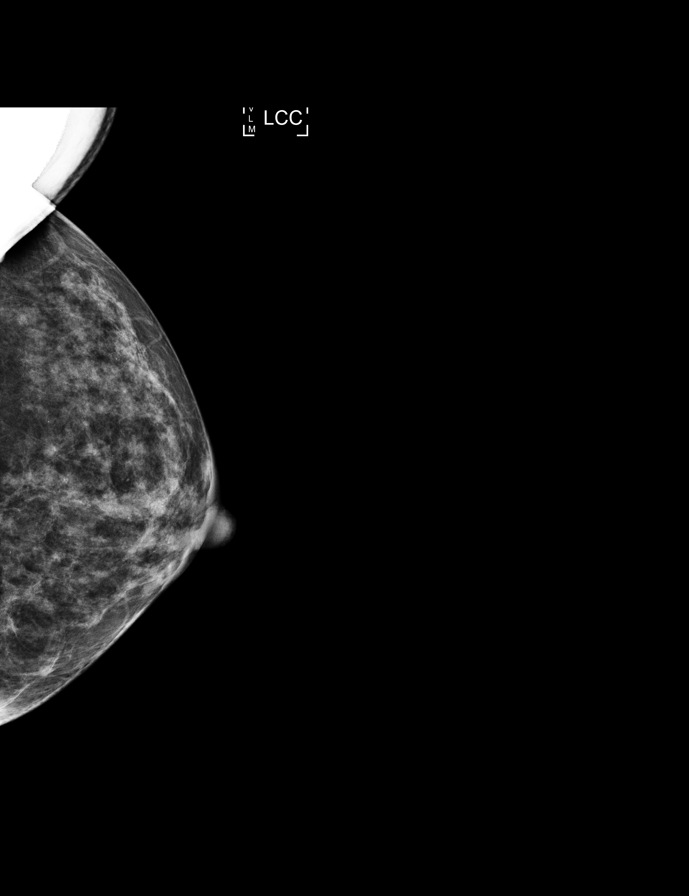

[L CC synth-2D]
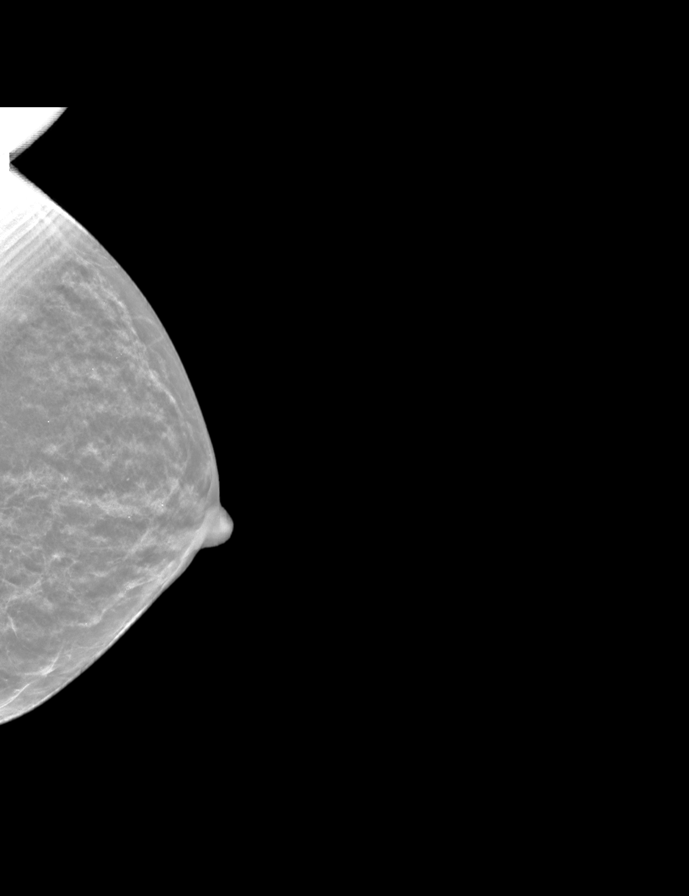

[R CC synth-2D]
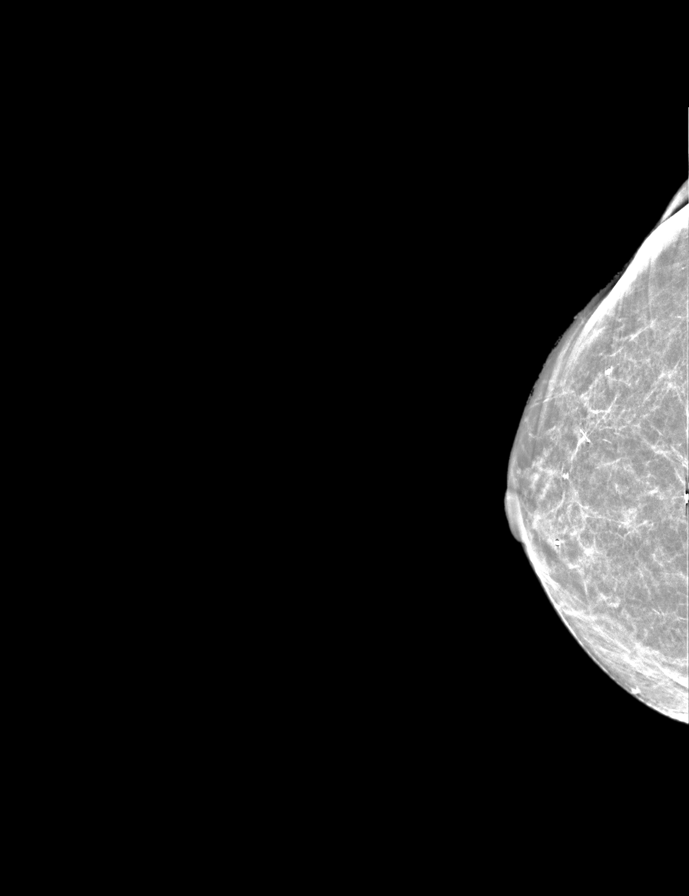

[R MLO]
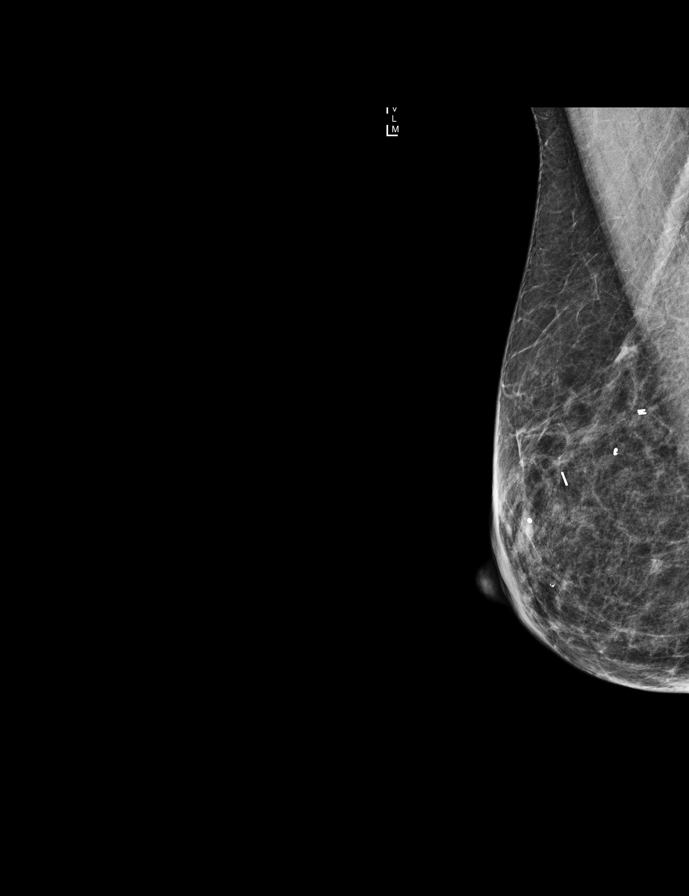

[R CC]
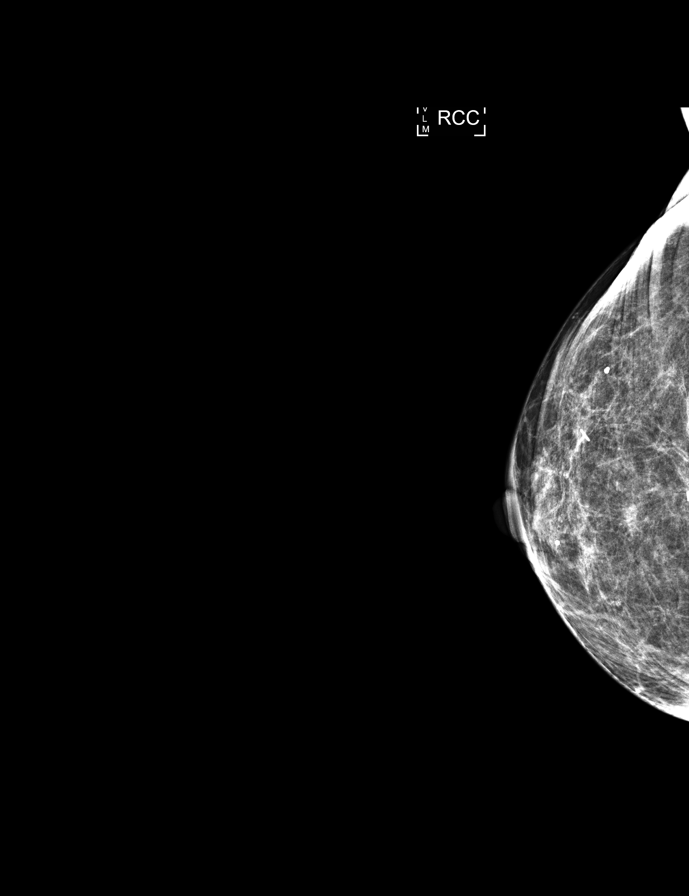

[L MLO synth-2D]
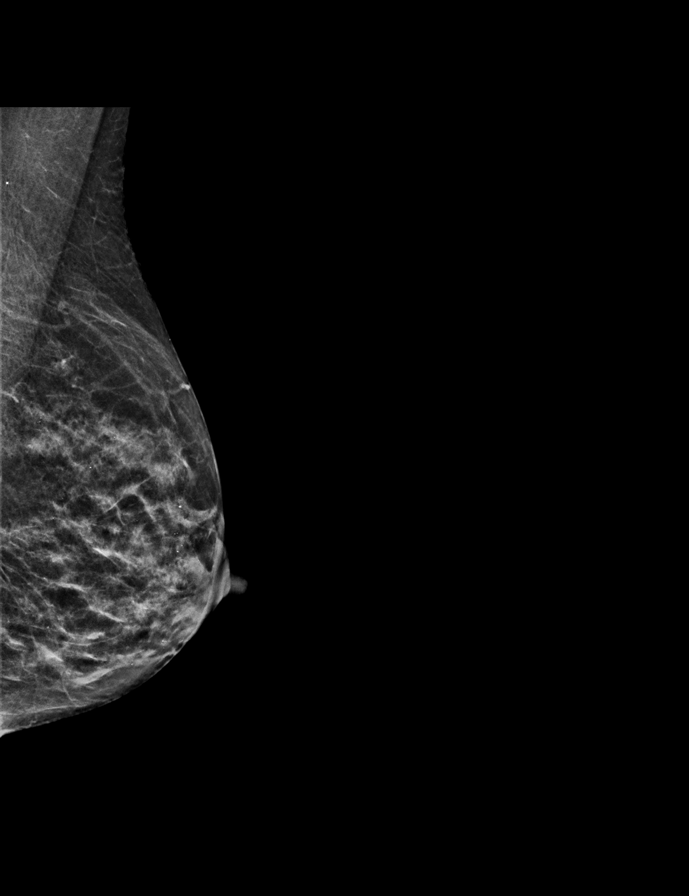

[R MLO synth-2D]
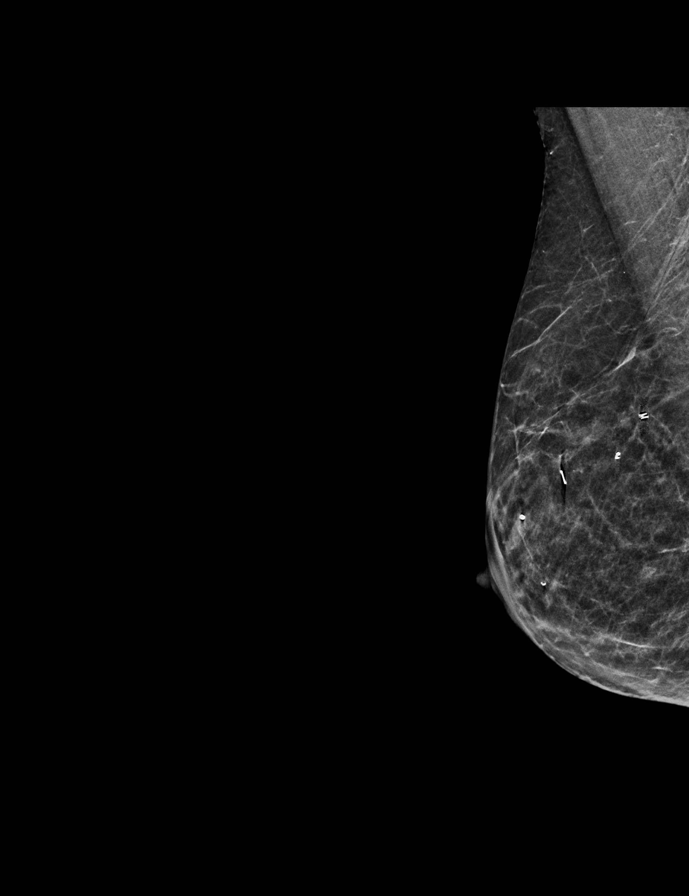

[9 of 30 positions shown; findings below may reference images not displayed]

ACR Breast Density Category b: There are scattered areas of
fibroglandular density.
FINDINGS: There are no findings suspicious for malignancy. Images were
processed with CAD.
IMPRESSION: No mammographic evidence of malignancy. A result letter of this
screening mammogram will be mailed directly to the patient.

RECOMMENDATION:
Screening mammogram in one year. (Code:55-L-23V)

BI-RADS CATEGORY  1: Negative.

## 2016-06-30 ENCOUNTER — Ambulatory Visit
Admission: RE | Admit: 2016-06-30 | Discharge: 2016-06-30 | Disposition: A | Discharge status: Home / Self Care | Payer: Medicare Other | Source: Ambulatory Visit | Attending: General Surgery | Admitting: General Surgery

## 2016-06-30 ENCOUNTER — Other Ambulatory Visit: Payer: Self-pay | Admitting: General Surgery

## 2016-06-30 DIAGNOSIS — Z853 Personal history of malignant neoplasm of breast: Secondary | ICD-10-CM

## 2016-06-30 DIAGNOSIS — Z1231 Encounter for screening mammogram for malignant neoplasm of breast: Secondary | ICD-10-CM | POA: Diagnosis present

## 2016-07-07 ENCOUNTER — Ambulatory Visit (INDEPENDENT_AMBULATORY_CARE_PROVIDER_SITE_OTHER): Payer: Medicare Other | Admitting: General Surgery

## 2016-07-07 ENCOUNTER — Encounter: Payer: Self-pay | Admitting: General Surgery

## 2016-07-07 VITALS — BP 100/74 | HR 80 | Resp 12 | Ht 64.0 in | Wt 99.0 lb

## 2016-07-07 DIAGNOSIS — Z853 Personal history of malignant neoplasm of breast: Secondary | ICD-10-CM | POA: Diagnosis not present

## 2016-07-07 NOTE — Progress Notes (Signed)
Patient ID: Elaine Clayton, female   DOB: [birth year], 70 y.o.   MRN: [id number]  Chief Complaint  Patient presents with  . Follow-up    mammogram    HPI Elaine Clayton is a 70 y.o. female who presents for a breast evaluation. The most recent mammogram was done on 06/30/16. Patient does perform regular self breast checks and gets regular mammograms done.  No GI difficulties status post resection of the cecum and terminal ileum for a tubular adenoma in July 2016. Follow-up colonoscopy would be appropriate in 3-5 years.   HPI  Past Medical History  Diagnosis Date  . Glaucoma   . Pancreatitis   . Pituitary microadenoma ([place])   . COPD (chronic obstructive pulmonary disease) ([place])   . Personal history of malignant neoplasm of breast   . Broken foot 2015    right foot  . Lithium toxicity 2015  . Chronic kidney disease     Dr Holley Raring  . Bipolar affective disorder ([place])     2  . Renal insufficiency   . Anemia   . Hyperlipemia   . Recurrent UTI   . Dysphagia   . Stomach ulcer 2112  . Chronic gastritis   . 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 Hypertension     pt has recently come off BP meds.  MD aware. BP seems stable.  . Osteoarthritis     back  . Neck stiffness     s/p C3-C4 fusion, limited right turn  . Dizziness     inner ear (per pt) several times per week    Past Surgical History  Procedure Laterality Date  . Colonoscopy  2004  . Neck surgery  2006  . Eye surgery  2013  . Appendectomy    . Tubal ligation    . Colonoscopy N/A 05/14/2015    Procedure: COLONOSCOPY;  Surgeon: Manya Silvas, MD;  Location: [facility] ENDOSCOPY;  Service: Endoscopy;  Laterality: N/A;  . Esophagogastroduodenoscopy N/A 05/14/2015    Procedure: ESOPHAGOGASTRODUODENOSCOPY (EGD);  Surgeon: Manya Silvas, MD;  Location: [facility] ENDOSCOPY;  Service: Endoscopy;  Laterality: N/A;  . Savory dilation N/A 05/14/2015     Procedure: SAVORY DILATION;  Surgeon: Manya Silvas, MD;  Location: [facility] ENDOSCOPY;  Service: Endoscopy;  Laterality: N/A;  . Ercp N/A   . Glaucoma surgery Right 2013  . Cataract Right 2013  . Breast surgery Left  2013    left breast wide excision,Intraductal papilloma, ductal hyperplasia and sclerosing adenosis. Microcalcifications associated with columnar cell change. No evidence of atypia or malignancy. Margins are unremarkable.  . Breast surgery Right November 06, 1999    multiple areas of microcalcification showing evidence of sclerosing adenosis and ductal hyperplasia.  . Breast surgery Left May 07, 2007    wide excision.  . Eus N/A 2011, 2012  . Laparoscopic right colectomy Right 06/22/2015    Procedure: LAPAROSCOPIC RIGHT COLECTOMY;  Surgeon: Robert Bellow, MD;  Location: [facility] ORS;  Service: General;  Laterality: Right;  . Breast biopsy Right 2011    neg  . Breast excisional biopsy Right 2001    neg  . Breast excisional biopsy Right 2008    breast ca 2008 radiation  . Breast excisional biopsy Left 10/26/2012    neg  . Right breast cancer    . Trabeculectomy Left 03/05/2016    Procedure: TRABECULECTOMY WITH [facility] AND EXPRESS SHUNT;  Surgeon:  Leandrew Koyanagi, MD;  Location: [place];  Service: Ophthalmology;  Laterality: Left;  . Cataract extraction w/phaco Left 03/05/2016    Procedure: CATARACT EXTRACTION PHACO AND INTRAOCULAR LENS PLACEMENT (IOC);  Surgeon: Leandrew Koyanagi, MD;  Location: [place];  Service: Ophthalmology;  Laterality: Left;    Family History  Problem Relation Age of Onset  . Breast cancer Mother 65    Social History Social History  Substance Use Topics  . Smoking status: Former Smoker -- 1.00 packs/day for 35 years    Quit date: 12/23/2007  . Smokeless tobacco: Never Used  . Alcohol Use: 0.0 oz/week    0 Standard drinks or equivalent per week     Comment: 1 glass wine/mo    Allergies  Allergen Reactions  . Influenza  Vaccines Anaphylaxis  . Ciprofloxacin     Yeast infection  . Flu Virus Vaccine Other (See Comments)    Muscle spams  . Sulfa Antibiotics Rash  . Tetracyclines & Related Rash    Current Outpatient Prescriptions  Medication Sig Dispense Refill  . ALPRAZolam (XANAX) 0.5 MG tablet Take 0.5 mg by mouth 2 (two) times daily as needed for sleep. 0.5-1 tablet    . bimatoprost (LUMIGAN) 0.03 % ophthalmic solution Place 1 drop into both eyes at bedtime.    . bisacodyl (DULCOLAX) 5 MG EC tablet Take 5 mg by mouth daily as needed for moderate constipation.    . Brimonidine Tartrate-Timolol (COMBIGAN OP) Apply 1 drop to eye 2 (two) times daily.    Marland Kitchen buPROPion (WELLBUTRIN SR) 150 MG 12 hr tablet Take 150 mg by mouth every morning. 2 tablets    . CHLORDIAZEPOXIDE HCL PO Take 1 capsule by mouth 3 (three) times daily.    Marland Kitchen dextromethorphan 15 MG/5ML syrup Take 10 mLs by mouth 4 (four) times daily as needed for cough.    . esomeprazole (NEXIUM) 40 MG capsule Take by mouth.    [facility]) 0.045-0.015 MG/DAY Place 1 patch onto the skin once a week. Changed on Sundays    . Fluticasone Furoate-Vilanterol (BREO ELLIPTA) 100-25 MCG/INH AEPB Inhale 1 puff into the lungs daily. 1 each 11  . ipratropium (ATROVENT) 0.06 % nasal spray Place 2 sprays into both nostrils 4 (four) times daily as needed for rhinitis.    Marland Kitchen nicotine polacrilex (NICORETTE) 2 MG gum Take 2 mg by mouth as needed for smoking cessation.    . ranitidine (ZANTAC) 150 MG tablet Take 150 mg by mouth daily.    . rosuvastatin (CRESTOR) 10 MG tablet Take 10 mg by mouth at bedtime.     . vitamin B-12 (CYANOCOBALAMIN) 1000 MCG tablet Take 1,000 mcg by mouth daily.     No current facility-administered medications for this visit.    Review of Systems Review of Systems  Constitutional: Negative.   Respiratory: Negative.   Cardiovascular: Negative.     Blood pressure 100/74, pulse 80, resp. rate 12, height 5\' 4"  (1.626 m),  weight 99 lb (44.906 kg).  The patient's weight is up 2 pounds from her exam last year.  Physical Exam Physical Exam  Constitutional: She is oriented to person, place, and time. She appears well-developed and well-nourished.  Eyes: Conjunctivae are normal. No scleral icterus.  Neck: Neck supple.  Cardiovascular: Normal rate, regular rhythm and normal heart sounds.   Pulmonary/Chest: Effort normal and breath sounds normal. Right breast exhibits no inverted nipple, no mass, no nipple discharge, no skin change and no tenderness. Left breast exhibits no  inverted nipple, no mass, no nipple discharge, no skin change and no tenderness.  Abdominal: Soft. Bowel sounds are normal. There is no tenderness.  Lymphadenopathy:    She has no cervical adenopathy.    She has no axillary adenopathy.  Neurological: She is alert and oriented to person, place, and time.  Skin: Skin is warm and dry.    Data Reviewed Bilateral mammograms dated 06/30/2016 were reviewed. BI-RADS-1.  Assessment    Benign breast exam.    Plan       Patient will be asked to return to the office in one year with a bilateral screening mammogram.  PCP: Fulton Reek This has been scribed by Lesly Rubenstein LPN    Robert Bellow 07/08/2016, 12:21 PM

## 2016-07-07 NOTE — Progress Notes (Deleted)
Patient ID: Elaine Clayton, female   DOB: 02-21-1946, 70 y.o.   MRN: [id number]  No chief complaint on file.   HPI Elaine Clayton is a 70 y.o. female who presents for a breast evaluation. The most recent mammogram was done on 06/30/16.  Patient does perform regular self breast checks and gets regular mammograms done.    HPI  Past Medical History  Diagnosis Date  . Glaucoma   . Pancreatitis   . Pituitary microadenoma (Elaine Clayton)   . COPD (chronic obstructive pulmonary disease) (Elaine Clayton)   . Personal history of malignant neoplasm of breast   . Broken foot 2015    right foot  . Lithium toxicity 2015  . Chronic kidney disease     Dr Holley Raring  . Bipolar affective disorder (Elaine Clayton)     2  . Renal insufficiency   . Anemia   . Hyperlipemia   . Recurrent UTI   . Dysphagia   . Stomach ulcer 2112  . Chronic gastritis   . 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 Hypertension     pt has recently come off BP meds.  MD aware. BP seems stable.  . Osteoarthritis     back  . Neck stiffness     s/p C3-C4 fusion, limited right turn  . Dizziness     inner ear (per pt) several times per week    Past Surgical History  Procedure Laterality Date  . Colonoscopy  2004  . Neck surgery  2006  . Eye surgery  2013  . Appendectomy    . Tubal ligation    . Colonoscopy N/A 05/14/2015    Procedure: COLONOSCOPY;  Surgeon: Elaine Silvas, MD;  Location: [facility] ENDOSCOPY;  Service: Endoscopy;  Laterality: N/A;  . Esophagogastroduodenoscopy N/A 05/14/2015    Procedure: ESOPHAGOGASTRODUODENOSCOPY (EGD);  Surgeon: Elaine Silvas, MD;  Location: [facility] ENDOSCOPY;  Service: Endoscopy;  Laterality: N/A;  . Savory dilation N/A 05/14/2015    Procedure: SAVORY DILATION;  Surgeon: Elaine Silvas, MD;  Location: [facility] ENDOSCOPY;  Service: Endoscopy;  Laterality: N/A;  . Ercp N/A   . Glaucoma surgery Right 2013  . Cataract Right 2013  . Breast  surgery Left  2013    left breast wide excision,Intraductal papilloma, ductal hyperplasia and sclerosing adenosis. Microcalcifications associated with columnar cell change. No evidence of atypia or malignancy. Margins are unremarkable.  . Breast surgery Right November 06, 1999    multiple areas of microcalcification showing evidence of sclerosing adenosis and ductal hyperplasia.  . Breast surgery Left May 07, 2007    wide excision.  . Eus N/A 2011, 2012  . Laparoscopic right colectomy Right 06/22/2015    Procedure: LAPAROSCOPIC RIGHT COLECTOMY;  Surgeon: Robert Bellow, MD;  Location: [facility] ORS;  Service: General;  Laterality: Right;  . Breast biopsy Right 2011    neg  . Breast excisional biopsy Right 2001    neg  . Breast excisional biopsy Right 2008    breast ca 2008 radiation  . Breast excisional biopsy Left 10/26/2012    neg  . Right breast cancer    . Trabeculectomy Left 03/05/2016    Procedure: TRABECULECTOMY WITH [facility] AND EXPRESS SHUNT;  Surgeon: Elaine Koyanagi, MD;  Location: [place];  Service: Ophthalmology;  Laterality: Left;  . Cataract extraction w/phaco Left 03/05/2016    Procedure: CATARACT EXTRACTION PHACO AND INTRAOCULAR LENS PLACEMENT (IOC);  Surgeon:  Elaine Koyanagi, MD;  Location: [place];  Service: Ophthalmology;  Laterality: Left;    Family History  Problem Relation Age of Onset  . Breast cancer Mother 15    Social History Social History  Substance Use Topics  . Smoking status: Former Smoker -- 1.00 packs/day for 35 years    Quit date: 12/23/2007  . Smokeless tobacco: Never Used  . Alcohol Use: 0.0 oz/week    0 Standard drinks or equivalent per week     Comment: 1 glass wine/mo    Allergies  Allergen Reactions  . Influenza Vaccines Anaphylaxis  . Ciprofloxacin     Yeast infection  . Flu Virus Vaccine Other (See Comments)    Muscle spams  . Sulfa Antibiotics Rash  . Tetracyclines & Related Rash    Current Outpatient  Prescriptions  Medication Sig Dispense Refill  . ALPRAZolam (XANAX) 0.5 MG tablet Take 0.5 mg by mouth 2 (two) times daily as needed for sleep. 0.5-1 tablet    . bimatoprost (LUMIGAN) 0.03 % ophthalmic solution Place 1 drop into both eyes at bedtime.    . bisacodyl (DULCOLAX) 5 MG EC tablet Take 5 mg by mouth daily as needed for moderate constipation.    . Brimonidine Tartrate-Timolol (COMBIGAN OP) Apply 1 drop to eye 2 (two) times daily.    Marland Kitchen buPROPion (WELLBUTRIN SR) 150 MG 12 hr tablet Take 150 mg by mouth every morning. 2 tablets    . CHLORDIAZEPOXIDE HCL PO Take 1 capsule by mouth 3 (three) times daily.    Marland Kitchen dextromethorphan 15 MG/5ML syrup Take 10 mLs by mouth 4 (four) times daily as needed for cough.    . esomeprazole (NEXIUM) 40 MG capsule Take by mouth.    Elaine [facility]) 0.045-0.015 MG/DAY Place 1 patch onto the skin once a week. Changed on Sundays    . Fluticasone Furoate-Vilanterol (BREO ELLIPTA) 100-25 MCG/INH AEPB Inhale 1 puff into the lungs daily. 1 each 11  . HYDROcodone-acetaminophen (NORCO/VICODIN) 5-325 MG tablet Take by mouth.    Marland Kitchen ipratropium (ATROVENT) 0.06 % nasal spray Place 2 sprays into both nostrils 4 (four) times daily as needed for rhinitis.    Marland Kitchen nicotine polacrilex (NICORETTE) 2 MG gum Take 2 mg by mouth as needed for smoking cessation.    . ondansetron (ZOFRAN-ODT) 4 MG disintegrating tablet Take 1 tablet (4 mg total) by mouth every 4 (four) hours as needed for nausea or vomiting. 20 tablet 0  . ranitidine (ZANTAC) 150 MG tablet Take 150 mg by mouth daily.    . rosuvastatin (CRESTOR) 10 MG tablet Take 10 mg by mouth at bedtime.     . vitamin B-12 (CYANOCOBALAMIN) 1000 MCG tablet Take 1,000 mcg by mouth daily.     No current facility-administered medications for this visit.    Review of Systems Review of Systems  There were no vitals taken for this visit.  Physical Exam Physical Exam  Data Reviewed ***  Assessment    ***     Plan    ***    PCP:  [place] This information has been scribed by Gaspar Cola CMA.    Gaspar Cola 07/07/2016, 1:23 PM

## 2016-07-07 NOTE — Patient Instructions (Signed)
Patient will be asked to return to the office in one year with a bilateral screening mammogram. 

## 2016-11-22 ENCOUNTER — Other Ambulatory Visit: Payer: Self-pay | Admitting: Pulmonary Disease

## 2016-11-24 ENCOUNTER — Telehealth: Payer: Self-pay | Admitting: Pulmonary Disease

## 2016-11-24 MED ORDER — FLUTICASONE FUROATE-VILANTEROL 100-25 MCG/INH IN AEPB: 1.0000 | INHALATION_SPRAY | Freq: Every day | RESPIRATORY_TRACT | 3 refills | Status: DC

## 2016-11-24 NOTE — Telephone Encounter (Signed)
Rx has been sent to preferred pharmacy. LM to make pt aware of this. Nothing further needed.

## 2016-11-24 NOTE — Telephone Encounter (Signed)
°*  STAT* If patient is at the pharmacy, call can be transferred to refill team.   1. Which medications need to be refilled? (please list name of each medication and dose if known) Breo  100-25 mcg/ing aepb 1 puff daily   2. Which pharmacy/location (including street and city if local pharmacy) is medication to be sent to?  [place]  3. Do they need a 30 day or 90 day supply? 90  She is out of this medication

## 2016-12-08 ENCOUNTER — Ambulatory Visit (INDEPENDENT_AMBULATORY_CARE_PROVIDER_SITE_OTHER): Payer: Medicare Other | Admitting: Neurology

## 2016-12-08 ENCOUNTER — Encounter: Payer: Self-pay | Admitting: Neurology

## 2016-12-08 VITALS — BP 136/80 | HR 78 | Resp 14 | Ht 64.0 in | Wt 100.0 lb

## 2016-12-08 DIAGNOSIS — M489 Spondylopathy, unspecified: Secondary | ICD-10-CM | POA: Diagnosis not present

## 2016-12-08 DIAGNOSIS — Z853 Personal history of malignant neoplasm of breast: Secondary | ICD-10-CM | POA: Diagnosis not present

## 2016-12-08 DIAGNOSIS — R278 Other lack of coordination: Secondary | ICD-10-CM | POA: Diagnosis not present

## 2016-12-08 DIAGNOSIS — D352 Benign neoplasm of pituitary gland: Secondary | ICD-10-CM | POA: Insufficient documentation

## 2016-12-08 NOTE — Progress Notes (Signed)
NEUROLOGY CLINIC   Provider:  Larey Seat, M D  Referring Provider: Idelle Crouch, MD Primary Care Physician:  Idelle Crouch, MD  Chief Complaint  Patient presents with  . New Patient (Initial Visit)    Rm 11. Patient states that she is unbalanced, fatigue, and unsteady gait. History of being on Lithium and h/o Lithium toxicity.     HPI:  Elaine Clayton is a 70 y.o. female , seen here as a referral from Dr. 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 She joy playing golf, she still swims. She participates in water aerobics. 2 summers ago she broke her foot, and she fell off the curb. Her primary care physician had noted that she developed progressive ataxia, she has a history of breast cancer status post lumpectomy and adjuvant radiation therapy. History of bipolar disorder treated with lithium, lithium was discontinued when she had toxic levels is ago but the ataxia seemed to have progressed since. She also has unspecified anemia chronic ups reduction of the upper airway, had a history of pancreatitis, degenerative disc disease, pituitary macroadenoma, osteoarthritis, gastritis, Dr. Doy Hutching also listed COPD- she smoked for 35 years.. Renal insufficiency.   She has ENT, psychiatry and pulmonology specialists.   Medical history and family history:  Mother died of breast cancer , had COPD.   Social history: married for 69 years, one daughter , 2 grand -daughter. Former Animal nutritionist. Retired.   Review of Systems: Out of a complete 14 system review, the patient complains of only the following symptoms, and all other reviewed systems are negative.  I have not noticed any tremor, she does have slightly stooped gait and she moves rigidly -she does not have a lot of arm swing and she does not have a lot of lumbar  rotation.  She is talkative,  [place].   Social History   Social History  . Marital status: Married    Spouse name: N/A  . Number of children: 1  . Years of education: Med   Occupational History  . Retired    Social History Main Topics  . Smoking status: Former Smoker    Packs/day: 1.00    Years: 35.00    Quit date: 12/23/2007  . Smokeless tobacco: Never Used  . Alcohol use 0.0 oz/week     Comment: 1 glass wine/mo  . Drug use: No  . Sexual activity: Not on file   Other Topics Concern  . Not on file   Social History Narrative   Rare caffeine use    Family History  Problem Relation Age of Onset  . Breast cancer Mother 28    Past Medical History:  Diagnosis Date  . Anemia   . Bipolar affective disorder ([place])    2  . Broken foot 2015   right foot  . Chronic gastritis   . Chronic kidney disease    Dr Holley Raring  . COPD (chronic obstructive pulmonary disease) ([place])   . Dizziness    inner ear (per pt) several times per week  . Dysphagia   . Glaucoma   . Hyperlipemia   . Hypertension    pt has recently come off BP meds.  MD aware. BP seems stable.  . Lithium toxicity 2015  . Malignant neoplasm of upper-outer quadrant of female breast (  [place]) May 07, 2007   tubular carcinoma, 1 mm, T1a,Nx  . Neck stiffness    s/p C3-C4 fusion, limited right turn  . Osteoarthritis    back  . Pancreatitis   . Personal history of malignant neoplasm of breast   . Pituitary microadenoma ([place])   . Recurrent UTI   . Renal insufficiency   . Stomach ulcer 2112    Past Surgical History:  Procedure Laterality Date  . APPENDECTOMY    . BREAST BIOPSY Right 2011   neg  . BREAST EXCISIONAL BIOPSY Right 2001   neg  . BREAST EXCISIONAL BIOPSY Right 2008   breast ca 2008 radiation  . BREAST EXCISIONAL BIOPSY Left 10/26/2012   neg  . BREAST SURGERY Left  2013   left breast wide excision,Intraductal papilloma, ductal hyperplasia and sclerosing adenosis. Microcalcifications associated  with columnar cell change. No evidence of atypia or malignancy. Margins are unremarkable.  Marland Kitchen BREAST SURGERY Right November 06, 1999   multiple areas of microcalcification showing evidence of sclerosing adenosis and ductal hyperplasia.  Marland Kitchen BREAST SURGERY Left May 07, 2007   wide excision.  . cataract Right 2013  . CATARACT EXTRACTION W/PHACO Left 03/05/2016   Procedure: CATARACT EXTRACTION PHACO AND INTRAOCULAR LENS PLACEMENT (IOC);  Surgeon: Leandrew Koyanagi, MD;  Location: [place];  Service: Ophthalmology;  Laterality: Left;  . COLONOSCOPY  2004  . COLONOSCOPY N/A 05/14/2015   Procedure: COLONOSCOPY;  Surgeon: Manya Silvas, MD;  Location: [facility] ENDOSCOPY;  Service: Endoscopy;  Laterality: N/A;  . ERCP N/A   . ESOPHAGOGASTRODUODENOSCOPY N/A 05/14/2015   Procedure: ESOPHAGOGASTRODUODENOSCOPY (EGD);  Surgeon: Manya Silvas, MD;  Location: [facility] ENDOSCOPY;  Service: Endoscopy;  Laterality: N/A;  . EUS N/A 2011, 2012  . EYE SURGERY  2013  . GLAUCOMA SURGERY Right 2013  . LAPAROSCOPIC RIGHT COLECTOMY Right 06/22/2015   Procedure: LAPAROSCOPIC RIGHT COLECTOMY;  Surgeon: Robert Bellow, MD;  Location: [facility] ORS;  Service: General;  Laterality: Right;  . NECK SURGERY  2006  . right breast cancer    . SAVORY DILATION N/A 05/14/2015   Procedure: SAVORY DILATION;  Surgeon: Manya Silvas, MD;  Location: [facility] ENDOSCOPY;  Service: Endoscopy;  Laterality: N/A;  . TRABECULECTOMY Left 03/05/2016   Procedure: TRABECULECTOMY WITH [facility] AND EXPRESS SHUNT;  Surgeon: Leandrew Koyanagi, MD;  Location: [place];  Service: Ophthalmology;  Laterality: Left;  . TUBAL LIGATION      Current Outpatient Prescriptions  Medication Sig Dispense Refill  . ALPRAZolam (XANAX) 0.5 MG tablet Take 0.5 mg by mouth 2 (two) times daily as needed for sleep. 0.5-1 tablet    . bimatoprost (LUMIGAN) 0.03 % ophthalmic solution Place 1 drop into both eyes at bedtime.    . bisacodyl (DULCOLAX) 5 MG EC  tablet Take 5 mg by mouth daily as needed for moderate constipation.    . Brimonidine Tartrate-Timolol (COMBIGAN OP) Apply 1 drop to eye 2 (two) times daily.    Marland Kitchen buPROPion (WELLBUTRIN SR) 150 MG 12 hr tablet Take 150 mg by mouth every morning. 2 tablets    . CHLORDIAZEPOXIDE HCL PO Take 1 capsule by mouth 3 (three) times daily.    Marland Kitchen dextromethorphan 15 MG/5ML syrup Take 10 mLs by mouth 4 (four) times daily as needed for cough.    [facility]) 0.045-0.015 MG/DAY Place 1 patch onto the skin once a week. Changed on Sundays    . fluticasone furoate-vilanterol (BREO ELLIPTA) 100-25 MCG/INH AEPB Inhale 1 puff into the lungs  daily. 3 each 3  . ipratropium (ATROVENT) 0.06 % nasal spray Place 2 sprays into both nostrils 4 (four) times daily as needed for rhinitis.    Marland Kitchen losartan (COZAAR) 25 MG tablet Take 25 mg by mouth daily.    . nicotine polacrilex (NICORETTE) 2 MG gum Take 2 mg by mouth as needed for smoking cessation.    . ranitidine (ZANTAC) 150 MG tablet Take 150 mg by mouth daily.    . rosuvastatin (CRESTOR) 10 MG tablet Take 10 mg by mouth at bedtime.     . vitamin B-12 (CYANOCOBALAMIN) 1000 MCG tablet Take 1,000 mcg by mouth daily.     No current facility-administered medications for this visit.     Allergies as of 12/08/2016 - Review Complete 12/08/2016  Allergen Reaction Noted  . Influenza vaccines Anaphylaxis 05/24/2015  . Ciprofloxacin  02/27/2016  . Flu virus vaccine Other (See Comments) 06/05/2015  . Sulfa antibiotics Rash 05/31/2013  . Tetracyclines & related Rash 05/31/2013    Vitals: BP 136/80   Pulse 78   Resp 14   Ht 5\' 4"  (1.626 m)   Wt 100 lb (45.4 kg)   BMI 17.16 kg/m  Last Weight:  Wt Readings from Last 1 Encounters:  12/08/16 100 lb (45.4 kg)   [id number] mass index is 17.16 kg/m.     Last Height:   Ht Readings from Last 1 Encounters:  12/08/16 5\' 4"  (1.626 m)    Physical exam:  General: The patient is awake, alert and appears not  in acute distress. The patient is well groomed. Head: Normocephalic, atraumatic. Neck is rigid, Cardiovascular:  Regular rate and rhythm , without  murmurs or carotid bruit, and without distended neck veins. Respiratory: Lungs are clear to auscultation. Skin:  Without evidence of edema, or rash Trunk: BMI is low  The patient's posture is stooped.   Neurologic exam : The patient is awake and alert, oriented to place and time.   Memory subjective  described as intact. Attention span & concentration ability appears normal.  Speech is fluent,  without dysarthria, but some dysphonia or aphasia.  Mood and affect are appropriate.  Cranial nerves: Pupils are equal and briskly reactive to light. Funduscopic exam without  evidence of pallor or edema. Extraocular movements  in vertical and horizontal planes intact and without nystagmus. Visual fields by finger perimetry are intact.Hearing to finger rub intact. Facial sensation intact to fine touch.Facial motor strength is symmetric and tongue and uvula move midline. Shoulder shrug was symmetrical.  Motor exam:  Normal upper extremity tone, low muscle bulk, and symmetric loss of  strength in all extremities. She has a rather weak grip strength. The thenar eminence is shrunken. Sensory:  Fine touch, pinprick and vibration were tested in all extremities. Proprioception tested in the upper extremities was normal. Coordination: Rapid alternating movements in the fingers/hands was normal. Finger-to-nose maneuver  evidence of ataxia, dysmetria not tremor. Revealing about 1-2 inch of dysmetria. She hits mailboxes when she drives. Always on the left . Hits her shoulder on the left walking through the door frame. She drops things with the right hand.  Gait and station: Patient walks without assistive device and is able unassisted to climb up to the exam table. Strength within normal limits. Stance is stable and normal. Tandem gait is fragmented. Turns with 4 Steps.  Romberg testing is negative.  Deep tendon reflexes: in the upper and lower extremities are symmetric and intact. Babinski maneuver response is downgoing.  The patient was  advised of the nature of the diagnosed sleep disorder , the treatment options and risks for general a health and wellness arising from not treating the condition.  I spent more than 45 minutes of face to face time with the patient. Greater than 50% of time was spent in counseling and coordination of care. We have discussed the diagnosis and differential and I answered the patient's questions.     Assessment:  After physical and neurologic examination, review of laboratory studies,  Personal review of imaging studies, reports of other /same  Imaging studies ,  Results of polysomnography/ neurophysiology testing and pre-existing records as far as provided in visit., my assessment is   1)  Mrs. Elaine Clayton does not have tremor, she is a slightly stooped gait with scoliosis and is status post cervical fusion as well as established diagnosis of lumbar spinal stenosis. She has for years been treated with lithium until 3 years ago and lithium can induce a tremor but usually this tremor resolves when the medication is discontinued. Lithium will not introduce ataxia. In her exam she demonstrated a wider based gait, she could turn this 4 steps and she demonstrated unsteadiness with turning. In addition she has a history of breast cancer and we have to keep in mind the paraneoplastic syndrome sometimes affect the cerebellum. She does not have nystagmus. She has a very hoarse voice, ever since neck surgery.   Plan:  Treatment plan and additional workup :  MRI brain and spine , microadenoma follow up, and  looking for atrophy, and for the degree of spinal stenosis, hardware at C 4-5.  If no abnormalities in these tests, will send for paraneoplastic antibodies.    Asencion Partridge Annie Roseboom MD  12/08/2016   CC: Idelle Crouch, [address] [facility] [place], [place] [address]

## 2016-12-08 NOTE — Patient Instructions (Signed)
Ataxia Introduction Ataxia is a condition that results in unsteadiness when walking and standing, poor coordination of body movements, and difficulty maintaining an upright posture. It occurs due to a problem with the part of your brain that controls coordination and stability (cerebellar dysfunction). What are the causes? Ataxia can develop later in life (acquired ataxia) during your 20s to 30s, and even as late as into your 60s or beyond. Acquired ataxia may be caused by:  Changes in your nervous system (neurodegenerative).  Changes throughout your body (systemic disorders).  Excess exposure to:  Medicines, such as phenytoin and lithium.  Solvents.  Abuse of alcohol (alcoholism).  Medical conditions, such as:  Celiac sprue.  Hypothyroidism.  Vitamin E deficiency.  Structural brain abnormalities, such as tumors.  Multiple sclerosis.  Stroke.  Head injury. Ataxia may also be present early in life (non-acquired ataxia). There are two main types of non-acquired ataxia:  Cerebellar dysfunction present at birth (congenital).  Family inheritance (genetic heredity). Friedreich ataxia is the most common form of hereditary ataxia. What are the signs or symptoms? The signs and symptoms of ataxia can vary depending on how severe the condition is that causes it. Signs and symptoms may include:  Unsteadiness.  Walking with a wide stance.  Tremor.  Poorly coordinated body movements.  Difficulty maintaining a straight (upright) posture.  Fatigue.  Changes in your speech.  Changes in your vision.  Difficulty swallowing.  Difficulty with writing.  Decreased mental status (dementia).  Muscle spasms. How is this diagnosed? Ataxia is diagnosed by discussing your personal and family history and through a physical exam. You may also have additional tests such as:  MRI.  Genetic testing. How is this treated? Treatment for ataxia may include treating or removing the  underlying condition causing the ataxia. Surgery may be required if a structural abnormality in your brain is causing the ataxia. Otherwise, supportive treatments may be used to manage your symptoms. Follow these instructions at home: Monitor your ataxia for any changes. The following actions may help any discomfort you are experiencing:  Do not drink alcohol.  Lie down right away if you become very unsteady, dizzy, nauseated, or feel like you are going to faint. Wait until all of these feelings pass before you get up again. Get help right away if:  Your unsteadiness suddenly worsens.  You develop severe headaches, chest pain, or abdominal pain.  You have weakness or numbness on one side of your body.  You have problems with your vision.  You feel confused.  You have difficulty speaking.  You have an irregular heartbeat or a very fast pulse. This information is not intended to replace advice given to you by your health care provider. Make sure you discuss any questions you have with your health care provider. Document Released: 07/05/2014 Document Revised: 05/15/2016 Document Reviewed: 03/10/2014  2017 Elsevier

## 2016-12-08 NOTE — Addendum Note (Signed)
Addended by: Larey Seat on: 12/08/2016 03:22 PM   Modules accepted: Orders

## 2016-12-20 ENCOUNTER — Ambulatory Visit
Admission: RE | Admit: 2016-12-20 | Discharge: 2016-12-20 | Disposition: A | Discharge status: Home / Self Care | Payer: Medicare Other | Source: Ambulatory Visit | Attending: Neurology | Admitting: Neurology

## 2016-12-20 DIAGNOSIS — R9082 White matter disease, unspecified: Secondary | ICD-10-CM | POA: Insufficient documentation

## 2016-12-20 DIAGNOSIS — R270 Ataxia, unspecified: Secondary | ICD-10-CM | POA: Insufficient documentation

## 2016-12-20 DIAGNOSIS — Z853 Personal history of malignant neoplasm of breast: Secondary | ICD-10-CM | POA: Diagnosis not present

## 2016-12-20 DIAGNOSIS — M4802 Spinal stenosis, cervical region: Secondary | ICD-10-CM | POA: Diagnosis not present

## 2016-12-20 DIAGNOSIS — R278 Other lack of coordination: Secondary | ICD-10-CM | POA: Diagnosis not present

## 2016-12-20 DIAGNOSIS — Z981 Arthrodesis status: Secondary | ICD-10-CM | POA: Insufficient documentation

## 2016-12-20 DIAGNOSIS — D352 Benign neoplasm of pituitary gland: Secondary | ICD-10-CM | POA: Diagnosis not present

## 2016-12-20 DIAGNOSIS — M489 Spondylopathy, unspecified: Secondary | ICD-10-CM | POA: Insufficient documentation

## 2016-12-20 LAB — POCT I-STAT CREATININE: Creatinine, Ser: 1.1 mg/dL — ABNORMAL HIGH (ref 0.44–1.00)

## 2016-12-20 MED ORDER — GADOBENATE DIMEGLUMINE 529 MG/ML IV SOLN
5.0000 mL | Freq: Once | INTRAVENOUS | Status: AC | PRN
  Administered 2016-12-20: 5 mL via INTRAVENOUS

## 2016-12-29 ENCOUNTER — Telehealth: Payer: Self-pay

## 2016-12-29 NOTE — Telephone Encounter (Signed)
I spoke to patient and she is aware of results. She has up coming appt 2/19 and asks if you feel that she needs to come in sooner?

## 2016-12-29 NOTE — Telephone Encounter (Signed)
-----   Message from Penni Bombard, MD sent at 12/25/2016  4:19 PM EST ----- Degenerative changes. Mild narrowing around spinal cord at C5-6. Recommend patient to discuss further with Dr. Brett Fairy when she returns. Patient could also discuss with her spine surgeon as well. No major or urgent issues at this point, but needs some follow up. -VRP

## 2016-12-29 NOTE — Telephone Encounter (Signed)
-----   Message from Penni Bombard, MD sent at 12/25/2016  4:17 PM EST ----- Pituitary gland is normal. Moderate chronic small vessel ischemic disease. No other major findings. Continue current plan. -VRP

## 2016-12-29 NOTE — Telephone Encounter (Signed)
No need for any urgent revisit. CD

## 2016-12-30 NOTE — Telephone Encounter (Signed)
I spoke to patient and she is aware of advice below.

## 2017-02-09 ENCOUNTER — Telehealth: Payer: Self-pay | Admitting: Neurology

## 2017-02-09 ENCOUNTER — Ambulatory Visit (INDEPENDENT_AMBULATORY_CARE_PROVIDER_SITE_OTHER): Payer: Medicare Other | Admitting: Neurology

## 2017-02-09 ENCOUNTER — Encounter: Payer: Self-pay | Admitting: Neurology

## 2017-02-09 VITALS — BP 132/72 | HR 72 | Resp 14 | Ht 64.0 in | Wt 103.0 lb

## 2017-02-09 DIAGNOSIS — R9082 White matter disease, unspecified: Secondary | ICD-10-CM

## 2017-02-09 DIAGNOSIS — R930 Abnormal findings on diagnostic imaging of skull and head, not elsewhere classified: Secondary | ICD-10-CM

## 2017-02-09 DIAGNOSIS — R251 Tremor, unspecified: Secondary | ICD-10-CM

## 2017-02-09 DIAGNOSIS — M5412 Radiculopathy, cervical region: Secondary | ICD-10-CM

## 2017-02-09 MED ORDER — ASPIRIN EC 81 MG PO TBEC: 81.0000 mg | DELAYED_RELEASE_TABLET | Freq: Every day | ORAL | Status: DC

## 2017-02-09 NOTE — Telephone Encounter (Signed)
Patient filled out release form to have records sent to Dr Dossie Arbour

## 2017-02-09 NOTE — Telephone Encounter (Signed)
Dr Milinda Pointer , [place] [facility]

## 2017-02-09 NOTE — Telephone Encounter (Signed)
Pt request referral to Dr Milinda Pointer at [facility] in [place].

## 2017-02-09 NOTE — Progress Notes (Signed)
NEUROLOGY CLINIC   Provider:  Larey Seat, M D  Referring Provider: Idelle Crouch, MD Primary Care Physician:  Idelle Crouch, MD  Chief Complaint  Patient presents with  . Follow-up    Rm 11. Patient is here to discuss her recent MRI.     HPI:  Elaine Clayton is a 71 y.o. female , seen here as a referral from Dr. 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 She joy playing golf, she still swims. She participates in water aerobics. 2 summers ago she broke her foot, and she fell off the curb. Her primary care physician had noted that she developed progressive ataxia, she has a history of breast cancer status post lumpectomy and adjuvant radiation therapy. History of bipolar disorder treated with lithium, lithium was discontinued when she had toxic levels is ago but the ataxia seemed to have progressed since. She also has unspecified anemia chronic ups reduction of the upper airway, had a history of pancreatitis, degenerative disc disease, pituitary macroadenoma, osteoarthritis, gastritis, Dr. Doy Hutching also listed COPD- she smoked for 35 years.. Renal insufficiency. She has ENT, psychiatry and pulmonology specialists Medical history and family history:  Mother died of breast cancer , had COPD.  Social history: married for 41 years, one daughter , 2 grand -daughter. Former Animal nutritionist. Retired.   Interval history from 02/09/2017, I have pleasure of meeting today with [name] in regards of the recently obtained imaging studies. Her MRI of the cervical spine did show a normal cord but significant degeneration of the bony structures of the spine was also noticed the upper thoracic spine between T2 and T3 showed a very degenerated disc and loss of intervertebral space. At C5 and C6 there is also height loss and  some chronic discogenic endplate changes. She does have arthrodesis with bridging bone marrow signal. This is unchanged to previous studies. The only boy some finding for me is that she does have quite significant neural foraminal narrowing on the right between C2 and C3 and then again at T1 and T2. We also obtained a brain MRI which showed patchy and confluent bilateral cerebral white matter disease most pronounced in the frontal lobes but no cortical encephalomalacia evidence of stroke or bleed. The cerebellum appeared normal. Please note that the pons is also involved. CLINICAL DATA:  71 year old female. Pituitary microadenoma. Increased dizziness, progressive balance issues for 3 years with falls, stumbling. Prior cervical spine surgery. Personal history of breast cancer. Initial encounter.  EXAM: MRI HEAD WITHOUT AND WITH CONTRAST  TECHNIQUE: Multiplanar, multiecho pulse sequences of the brain and surrounding structures were obtained without and with intravenous contrast.  CONTRAST:  5 mL MultiHance in conjunction with contrast enhanced imaging of the cervical spine reported separately.  COMPARISON:  Cervical spine MRI from today reported separately. Report of brain and pituitary MRI in 09/06/2003 and brain MRI 09/11/2003 (no images available).  FINDINGS: Brain: No restricted diffusion to suggest acute infarction. No midline shift, mass effect, evidence of mass lesion, ventriculomegaly, extra-axial collection or acute intracranial hemorrhage. Cervicomedullary junction within normal limits.  Patchy and confluent bilateral cerebral white matter T2 and FLAIR hyperintensity, most pronounced in the frontal lobes. No cortical encephalomalacia or chronic cerebral blood products. Mild T2 heterogeneity in the deep gray matter nuclei. More patchy T2 heterogeneity  throughout the pons. The cerebellum is normal. No abnormal enhancement identified.  Vascular: Major intracranial  vascular flow voids are preserved, with dominant appearing distal left vertebral artery. Incidental pneumatized right anterior clinoid process suspected.  Skull and upper cervical spine: Partially visible ACDF, cervical spine today reported separately.  Sinuses/Orbits: Postoperative changes to both globes, otherwise negative orbits soft tissues.  Paranasal sinuses and mastoids are clear. Visible internal auditory structures appear normal. Negative scalp soft tissues.  Other: Dedicated pituitary imaging. Overall normal pituitary size and configuration. Normal infundibulum. No suprasellar mass or mass effect. Normal hypothalamus. The cavernous sinuses appear normal. Following contrast there is mildly heterogeneous enhancement of the gland, but no discrete pituitary mass or lesion.  IMPRESSION: 1. MRI appearance of the pituitary is within normal limits. 2.  No metastatic disease or acute intracranial abnormality. 3. Advanced but nonspecific signal changes in the cerebral white matter and pons, most commonly due to chronic small vessel disease. 4. Cervical spine MRI today reported separately.   Electronically Signed   By: Genevie Ann M.D.  Review of Systems: Out of a complete 14 system review, the patient complains of only the following symptoms, and all other reviewed systems are negative.    Social History   Social History  . Marital status: Married    Spouse name: N/A  . Number of children: 1  . Years of education: Med   Occupational History  . Retired    Social History Main Topics  . Smoking status: Former Smoker    Packs/day: 1.00    Years: 35.00    Quit date: 12/23/2007  . Smokeless tobacco: Never Used  . Alcohol use 0.0 oz/week     Comment: 1 glass wine/mo  . Drug use: No  . Sexual activity: Not on file   Other Topics Concern  . Not on file   Social History Narrative   Rare caffeine use    Family History  Problem Relation Age of Onset  . Breast  cancer Mother 62    Past Medical History:  Diagnosis Date  . Anemia   . Bipolar affective disorder ([place])    2  . Broken foot 2015   right foot  . Chronic gastritis   . Chronic kidney disease    Dr Holley Raring  . COPD (chronic obstructive pulmonary disease) ([place])   . Dizziness    inner ear (per pt) several times per week  . Dysphagia   . Glaucoma   . Hyperlipemia   . Hypertension    pt has recently come off BP meds.  MD aware. BP seems stable.  . Lithium toxicity 2015  . Malignant neoplasm of upper-outer quadrant of female breast [facility]) May 07, 2007   tubular carcinoma, 1 mm, T1a,Nx  . Neck stiffness    s/p C3-C4 fusion, limited right turn  . Osteoarthritis    back  . Pancreatitis   . Personal history of malignant neoplasm of breast   . Pituitary microadenoma ([place])   . Recurrent UTI   . Renal insufficiency   . Stomach ulcer 2112    Past Surgical History:  Procedure Laterality Date  . APPENDECTOMY    . BREAST BIOPSY Right 2011   neg  . BREAST EXCISIONAL BIOPSY Right 2001   neg  . BREAST EXCISIONAL BIOPSY Right 2008   breast ca 2008 radiation  . BREAST EXCISIONAL BIOPSY Left 10/26/2012   neg  . BREAST SURGERY Left  2013   left breast wide excision,Intraductal papilloma, ductal hyperplasia  and sclerosing adenosis. Microcalcifications associated with columnar cell change. No evidence of atypia or malignancy. Margins are unremarkable.  Marland Kitchen BREAST SURGERY Right November 06, 1999   multiple areas of microcalcification showing evidence of sclerosing adenosis and ductal hyperplasia.  Marland Kitchen BREAST SURGERY Left May 07, 2007   wide excision.  . cataract Right 2013  . CATARACT EXTRACTION W/PHACO Left 03/05/2016   Procedure: CATARACT EXTRACTION PHACO AND INTRAOCULAR LENS PLACEMENT (IOC);  Surgeon: Leandrew Koyanagi, MD;  Location: [place];  Service: Ophthalmology;  Laterality: Left;  . COLONOSCOPY  2004  . COLONOSCOPY N/A 05/14/2015   Procedure: COLONOSCOPY;  Surgeon: Manya Silvas, MD;  Location[facility] ENDOSCOPY;  Service: Endoscopy;  Laterality: N/A;  . ERCP N/A   . ESOPHAGOGASTRODUODENOSCOPY N/A 05/14/2015   Procedure: ESOPHAGOGASTRODUODENOSCOPY (EGD);  Surgeon: Manya Silvas, MD;  Location: [facility] ENDOSCOPY;  Service: Endoscopy;  Laterality: N/A;  . EUS N/A 2011, 2012  . EYE SURGERY  2013  . GLAUCOMA SURGERY Right 2013  . LAPAROSCOPIC RIGHT COLECTOMY Right 06/22/2015   Procedure: LAPAROSCOPIC RIGHT COLECTOMY;  Surgeon: Robert Bellow, MD;  Location: [facility] ORS;  Service: General;  Laterality: Right;  . NECK SURGERY  2006  . right breast cancer    . SAVORY DILATION N/A 05/14/2015   Procedure: SAVORY DILATION;  Surgeon: Manya Silvas, MD;  Location[facility] ENDOSCOPY;  Service: Endoscopy;  Laterality: N/A;  . TRABECULECTOMY Left 03/05/2016   Procedure: TRABECULECTOMY WITH [facility] AND [facility]T;  Surgeon: Leandrew Koyanagi, MD;  Location: [place];  Service: Ophthalmology;  Laterality: Left;  . TUBAL LIGATION      Current Outpatient Prescriptions  Medication Sig Dispense Refill  . ALPRAZolam (XANAX) 0.5 MG tablet Take 0.5 mg by mouth 2 (two) times daily as needed for sleep. 0.5-1 tablet    . bisacodyl (DULCOLAX) 5 MG EC tablet Take 5 mg by mouth daily as needed for moderate constipation.    . Brimonidine Tartrate-Timolol (COMBIGAN OP) Apply 1 drop to eye 2 (two) times daily.    Marland Kitchen buPROPion (WELLBUTRIN SR) 150 MG 12 hr tablet Take 150 mg by mouth every morning. 2 tablets    . dextromethorphan 15 MG/5ML syrup Take 10 mLs by mouth 4 (four) times daily as needed for cough.    . esomeprazole (NEXIUM) 40 MG capsule Take by mouth.    [facility]) 0.045-0.015 MG/DAY Place 1 patch onto the skin once a week. Changed on Sundays    . fluticasone furoate-vilanterol (BREO ELLIPTA) 100-25 MCG/INH AEPB Inhale 1 puff into the lungs daily. 3 each 3  . ipratropium (ATROVENT) 0.06 % nasal spray Place 2 sprays into both nostrils 4 (four)  times daily as needed for rhinitis.    Marland Kitchen losartan (COZAAR) 25 MG tablet Take 25 mg by mouth daily.    . nicotine polacrilex (NICORETTE) 2 MG gum Take 2 mg by mouth as needed for smoking cessation.    . ondansetron (ZOFRAN) 8 MG tablet Take by mouth every 8 (eight) hours as needed for nausea or vomiting.    . ranitidine (ZANTAC) 150 MG tablet Take 150 mg by mouth daily.    . rosuvastatin (CRESTOR) 10 MG tablet Take 10 mg by mouth at bedtime.     . vitamin B-12 (CYANOCOBALAMIN) 1000 MCG tablet Take 1,000 mcg by mouth daily.     No current facility-administered medications for this visit.     Allergies as of 02/09/2017 - Review Complete 02/09/2017  Allergen Reaction Noted  . Influenza  vaccines Anaphylaxis 05/24/2015  . Ciprofloxacin  02/27/2016  . Flu virus vaccine Other (See Comments) 06/05/2015  . Sulfa antibiotics Rash 05/31/2013  . Tetracyclines & related Rash 05/31/2013    Vitals: BP 132/72   Pulse 72   Resp 14   Ht 5\' 4"  (1.626 m)   Wt 103 lb (46.7 kg)   BMI 17.68 kg/m  Last Weight:  Wt Readings from Last 1 Encounters:  02/09/17 103 lb (46.7 kg)   [id number] mass index is 17.68 kg/m.     Last Height:   Ht Readings from Last 1 Encounters:  02/09/17 5\' 4"  (1.626 m)    Physical exam:  General: The patient is awake, alert and appears not in acute distress. The patient is well groomed. Head: Normocephalic, atraumatic. Neck is rigid, Cardiovascular:  Regular rate and rhythm , without  murmurs or carotid bruit, and without distended neck veins. Respiratory: Lungs are clear to auscultation. Skin:  Without evidence of edema, or rash Trunk: BMI is low  The patient's posture is stooped.   Neurologic exam : The patient is awake and alert, oriented to place and time.   Memory subjective  described as intact. Attention span & concentration ability appears limited, logorrhea.  Mood and affect are giddy.  Cranial nerves: Pupils are equal and briskly reactive to light. Funduscopic  exam without  evidence of pallor or edema. Extraocular movements  in vertical and horizontal planes intact and without nystagmus. Visual fields by finger perimetry are intact.Hearing to finger rub intact. Facial sensation intact to fine touch.Facial motor strength is symmetric and tongue and uvula move midline. Shoulder shrug was symmetrical.  Motor exam:  Normal upper extremity tone, low muscle bulk, and symmetric loss of  strength in all extremities. She has a rather weak grip strength. The thenar eminence is shrunken.Sensory:  Fine touch, pinprick and vibration were tested in all extremities. Proprioception tested in the upper extremities was normal. Coordination: Rapid alternating movements in the fingers/hands was normal. Finger-to-nose maneuver  evidence of ataxia, dysmetria not tremor. Revealing about 1-2 inch of dysmetriaShe hits mailboxes when she drives. Always on the left . Hits her shoulder on the left walking through the door frame. She drops things with the right hand.  Gait and station: Patient walks without assistive device and is able unassisted to climb up to the exam table. Strength within normal limits. Stance is stable and normal. Tandem gait is fragmented. Turns with 4 Steps. Romberg testing is negative. Deep tendon reflexes: in the upper and lower extremities are symmetric and intact. Babinski maneuver response is downgoing.  The patient was advised of the  Diagnosis/ disorder  , the treatment options and risks for general a health and wellness arising from not treating the condition.  I spent more than 45 minutes of face to face time with the patient. Greater than 50% of time was spent in counseling and coordination of care. We have discussed the diagnosis and differential and I answered the patient's questions.     Assessment:  After physical and neurologic examination, review of laboratory studies,  Personal review of imaging studies, reports of other /same  Imaging studies ,   Results of polysomnography/ neurophysiology testing and pre-existing records as far as provided in visit., my assessment is   1)  Elaine Clayton does not have tremor, she is a slightly stooped gait with scoliosis and is status post cervical fusion as well as established diagnosis of lumbar spinal stenosis. She has for years been treated with  lithium until 3 years ago and lithium can induce a tremor but usually this tremor resolves when the medication is discontinued. Lithium will not introduce ataxia. She took lithium for over 12 years when she got "toxic". She developed renal impairment.    In her exam, she again demonstrated a wider based gait, she could turn this 4 steps and she demonstrated unsteadiness with turning. She has a very hoarse voice, ever since her neck surgery.   I believe that Elaine Clayton's gait impairment is multifactorial and has partially to do with an advancing white matter disease microvascular changes in the brain, and that some of her peripheral symptoms are related to degenerative spine disease. I do not find evidence of a cervical spinal stenosis. There is some radiculopathic manifestation over the left shoulder that would fit the C2-C3 degeneration, and there has been a foraminal narrowing at T1-T2. No hardware deterioration.   Plan:  Treatment plan and additional workup :  White matter disease, no PD, Not MS-  Start on ASA 81 mg every other day.  RV yearly.    Asencion Partridge Nekhi Liwanag MD  02/09/2017   CC: Idelle Crouch, [place] [facility] [place], [place] [address]

## 2017-03-19 ENCOUNTER — Ambulatory Visit
Admission: RE | Admit: 2017-03-19 | Discharge: 2017-03-19 | Disposition: A | Discharge status: Home / Self Care | Payer: Medicare Other | Source: Ambulatory Visit | Attending: Pain Medicine | Admitting: Pain Medicine

## 2017-03-19 ENCOUNTER — Encounter: Payer: Self-pay | Admitting: Pain Medicine

## 2017-03-19 ENCOUNTER — Ambulatory Visit: Payer: Medicare Other | Attending: Pain Medicine | Admitting: Pain Medicine

## 2017-03-19 ENCOUNTER — Other Ambulatory Visit
Admission: RE | Admit: 2017-03-19 | Discharge: 2017-03-19 | Disposition: A | Discharge status: Home / Self Care | Payer: Medicare Other | Source: Ambulatory Visit | Attending: Pain Medicine | Admitting: Pain Medicine

## 2017-03-19 VITALS — BP 141/89 | HR 76 | Temp 98.2°F | Resp 18 | Ht 64.0 in | Wt 100.0 lb

## 2017-03-19 DIAGNOSIS — F419 Anxiety disorder, unspecified: Secondary | ICD-10-CM | POA: Insufficient documentation

## 2017-03-19 DIAGNOSIS — Z8719 Personal history of other diseases of the digestive system: Secondary | ICD-10-CM | POA: Insufficient documentation

## 2017-03-19 DIAGNOSIS — C50411 Malignant neoplasm of upper-outer quadrant of right female breast: Secondary | ICD-10-CM | POA: Insufficient documentation

## 2017-03-19 DIAGNOSIS — Z853 Personal history of malignant neoplasm of breast: Secondary | ICD-10-CM | POA: Diagnosis not present

## 2017-03-19 DIAGNOSIS — R42 Dizziness and giddiness: Secondary | ICD-10-CM | POA: Insufficient documentation

## 2017-03-19 DIAGNOSIS — N189 Chronic kidney disease, unspecified: Secondary | ICD-10-CM | POA: Insufficient documentation

## 2017-03-19 DIAGNOSIS — E785 Hyperlipidemia, unspecified: Secondary | ICD-10-CM | POA: Insufficient documentation

## 2017-03-19 DIAGNOSIS — M25512 Pain in left shoulder: Secondary | ICD-10-CM

## 2017-03-19 DIAGNOSIS — G894 Chronic pain syndrome: Secondary | ICD-10-CM

## 2017-03-19 DIAGNOSIS — Z86010 Personal history of colonic polyps: Secondary | ICD-10-CM | POA: Insufficient documentation

## 2017-03-19 DIAGNOSIS — M25511 Pain in right shoulder: Secondary | ICD-10-CM | POA: Diagnosis present

## 2017-03-19 DIAGNOSIS — G8929 Other chronic pain: Secondary | ICD-10-CM | POA: Diagnosis present

## 2017-03-19 DIAGNOSIS — R1013 Epigastric pain: Secondary | ICD-10-CM

## 2017-03-19 DIAGNOSIS — M19012 Primary osteoarthritis, left shoulder: Secondary | ICD-10-CM | POA: Diagnosis not present

## 2017-03-19 DIAGNOSIS — H409 Unspecified glaucoma: Secondary | ICD-10-CM | POA: Diagnosis not present

## 2017-03-19 DIAGNOSIS — Z87440 Personal history of urinary (tract) infections: Secondary | ICD-10-CM | POA: Insufficient documentation

## 2017-03-19 DIAGNOSIS — F319 Bipolar disorder, unspecified: Secondary | ICD-10-CM | POA: Insufficient documentation

## 2017-03-19 DIAGNOSIS — R531 Weakness: Secondary | ICD-10-CM | POA: Diagnosis not present

## 2017-03-19 DIAGNOSIS — K861 Other chronic pancreatitis: Secondary | ICD-10-CM | POA: Insufficient documentation

## 2017-03-19 DIAGNOSIS — Z87891 Personal history of nicotine dependence: Secondary | ICD-10-CM | POA: Insufficient documentation

## 2017-03-19 DIAGNOSIS — G542 Cervical root disorders, not elsewhere classified: Secondary | ICD-10-CM

## 2017-03-19 DIAGNOSIS — M542 Cervicalgia: Secondary | ICD-10-CM

## 2017-03-19 DIAGNOSIS — Z7982 Long term (current) use of aspirin: Secondary | ICD-10-CM | POA: Insufficient documentation

## 2017-03-19 DIAGNOSIS — Z981 Arthrodesis status: Secondary | ICD-10-CM | POA: Diagnosis not present

## 2017-03-19 DIAGNOSIS — M19011 Primary osteoarthritis, right shoulder: Secondary | ICD-10-CM | POA: Insufficient documentation

## 2017-03-19 DIAGNOSIS — J449 Chronic obstructive pulmonary disease, unspecified: Secondary | ICD-10-CM | POA: Diagnosis not present

## 2017-03-19 DIAGNOSIS — K859 Acute pancreatitis without necrosis or infection, unspecified: Secondary | ICD-10-CM | POA: Insufficient documentation

## 2017-03-19 DIAGNOSIS — R29898 Other symptoms and signs involving the musculoskeletal system: Secondary | ICD-10-CM

## 2017-03-19 DIAGNOSIS — D352 Benign neoplasm of pituitary gland: Secondary | ICD-10-CM | POA: Diagnosis not present

## 2017-03-19 DIAGNOSIS — R200 Anesthesia of skin: Secondary | ICD-10-CM

## 2017-03-19 DIAGNOSIS — R202 Paresthesia of skin: Secondary | ICD-10-CM

## 2017-03-19 DIAGNOSIS — R278 Other lack of coordination: Secondary | ICD-10-CM | POA: Diagnosis not present

## 2017-03-19 DIAGNOSIS — D649 Anemia, unspecified: Secondary | ICD-10-CM | POA: Insufficient documentation

## 2017-03-19 LAB — COMPREHENSIVE METABOLIC PANEL
ALT: 14 U/L (ref 14–54)
AST: 22 U/L (ref 15–41)
Albumin: 4.5 g/dL (ref 3.5–5.0)
Alkaline Phosphatase: 53 U/L (ref 38–126)
Anion gap: 6 (ref 5–15)
BUN: 12 mg/dL (ref 6–20)
CO2: 26 mmol/L (ref 22–32)
Calcium: 9.5 mg/dL (ref 8.9–10.3)
Chloride: 107 mmol/L (ref 101–111)
Creatinine, Ser: 0.99 mg/dL (ref 0.44–1.00)
GFR calc Af Amer: >60 mL/min (ref >60)
GFR calc non Af Amer: 57 mL/min — ABNORMAL LOW (ref >60)
Glucose, Bld: 90 mg/dL (ref 65–99)
Potassium: 4 mmol/L (ref 3.5–5.1)
Sodium: 139 mmol/L (ref 135–145)
Total Bilirubin: 0.8 mg/dL (ref 0.3–1.2)
Total Protein: 7.5 g/dL (ref 6.5–8.1)

## 2017-03-19 LAB — VITAMIN B12: Vitamin B-12: 558 pg/mL (ref 180–914)

## 2017-03-19 LAB — C-REACTIVE PROTEIN: CRP: <0.8 mg/dL (ref <1.0)

## 2017-03-19 LAB — MAGNESIUM: Magnesium: 2.1 mg/dL (ref 1.7–2.4)

## 2017-03-19 LAB — SEDIMENTATION RATE: SED RATE: 4 mm/h (ref 0–30)

## 2017-03-19 NOTE — Progress Notes (Signed)
Safety precautions to be maintained throughout the outpatient stay will include: orient to surroundings, keep bed in low position, maintain call bell within reach at all times, provide assistance with transfer out of bed and ambulation.  

## 2017-03-19 NOTE — Progress Notes (Signed)
Patient's Name: Elaine Clayton  MRN: [id number]  Referring Provider: Idelle Crouch, MD  DOB: [birth year]  PCP: Idelle Crouch, MD  DOS: 03/19/2017  Note by: Kathlen Brunswick. Dossie Arbour, MD  Service setting: Ambulatory outpatient  Specialty: Interventional Pain Management  Location: [facility] (AMB) Pain Management Facility    Patient type: New Patient   Primary Reason(s) for Visit: Initial Patient Evaluation CC: Shoulder Pain (bilateral) and Neck Pain  HPI  Elaine Clayton is a 71 y.o. year old, female patient, who comes today for an initial evaluation. She has Breast cancer of upper-outer quadrant of right female breast ([place]); Personal history of malignant neoplasm of breast; Colon polyp; Loss of weight; Cervical spine disease; Pituitary microadenoma ([place]); History of breast cancer in adulthood; Dysmetria; Breast cancer ([place]); Bipolar disorder ([place]); Chronic pancreatitis ([place]); COPD (chronic obstructive pulmonary disease) (H[facility]); HTN (hypertension); OA (osteoarthritis); Renal insufficiency; Chronic pain syndrome; Chronic shoulder pain (Location of Primary Source of Pain) (Bilateral) (L>R); Osteoarthritis of shoulder (Bilateral); Chronic neck pain (Location of Secondary source of pain) (Bilateral) (L>R); History of C3-5 ACDF; Numbness and tingling in left upper extremity; Numbness and tingling of right upper extremity; Upper extremity weakness (Bilateral) (L>R); Numbness of upper extremity (Bilateral) (L>R); Numbness and tingling of lower extremity (Bilateral) (R>L); Weakness of lower extremity (Bilateral) (R>L); Chronic abdominal pain (epigastric); and Cervical syndrome on her problem list.. Her primarily concern today is the Shoulder Pain (bilateral) and Neck Pain  Pain Assessment: Self-Reported Pain Score: 0-No pain/10             Reported level is compatible with observation.       Pain Type: Chronic pain Pain Location: Neck Pain Orientation: Right, Left Pain Descriptors / Indicators:  (numbness in  hands) Pain Frequency: Intermittent  Onset and Duration: Gradual and Date of injury: 2 years ago Cause of pain: Unknown Severity: Getting worse, NAS-11 at its worse: 0/10 and NAS-11 at its best: 0/10 Timing: Afternoon, Night, During activity or exercise, After activity or exercise and After a period of immobility Aggravating Factors: Bending, Kneeling, Lifiting, Squatting, Stooping , Walking, Walking uphill and Walking downhill Alleviating Factors: Cold packs, Hot packs, Lying down, Medications, Resting and Sitting Associated Problems: Constipation, Depression, Dizziness, Fatigue, Inability to concentrate, Numbness, Tingling and Weakness Quality of Pain: Aching, Intermittent, Cramping, Deep, Dull, Feeling of weight, Pressure-like and Tiring Previous Examinations or Tests: Endoscopy, MRI scan and Neurological evaluation Previous Treatments: Epidural steroid injections, Narcotic medications and Radiofrequency  The patient comes into the clinics today for the first time for a chronic pain management evaluation. According to the patient the primary area of concern is that of the shoulder where she has bilateral shoulder pain with the left being worse than the right. She denies any surgery, nerve blocks, or physical therapy for the shoulders. In addition, she also denies having had any x-rays or MRIs of the affected area. The second area of pain is that of the neck primarily the posterior aspect and bilateral with the left being worse than the right. She had 1 cervical spine surgery in 2006 by Dr. Cyndia Skeeters, a neurosurgeon at [facility], home according to the patient passed away a couple years ago. She denies ever having had any nerve blocks or physical therapy for the cervical region. She does have some x-rays and MRIs. Her next area of concern is that of the upper extremity where she complains of bilateral numbness with the left being worse than the right. In the case of the left upper extremity  than  numbness and weakness seems to be affecting all of her fingers and she complains of dropping things and not being able to angle onto them. She indicates that it seems to affect more the middle and ring finger, suggesting a C7/C8 radiculopathy. In the case of the right upper extremity she has similar symptoms but this time the affected finger are the thumb, index finger, and middle finger, suggesting a C6/C7 radiculopathy. Her next area of concern is that of the lower extremities intermittent bilateral lower extremity weakness and numbness with the right being worst on the left. She denies any back surgery, nerve blocks, but she does admit to physical therapy and having had some x-rays of her right foot when she broke it. Other than that, no MRIs or any other x-rays. In the case of the right lower extremity the weakness and the numbness seems to affect the proximal anterior including the thigh. This is similar to the left upper extremity but to a lesser extent. Her next area of pain is that of the epigastric region with the pain being primarily in the center but radiating to the right. This pain is thought to be secondary to her chronic pancreatitis. In addition, she indicates having had a small intestine polyp that needed to be removed. Next she describes an intermittent chest pain that seems to appear only when she over exerts. She denies any upper extremity pain or numbness associated with this. She also describes being an ex-smoker and she blames her COPD from that.  The patient was questioned about any nerve conduction test but she indicates that the last one was done many years ago before she started having the numbness or weakness of the upper and lower extremities. In addition to this, her main concern appears to be that of intermittent dizziness, balance problems, and ataxia. She is interested in knowing whether or not her cervical spine problems are associated with this or if he has anything to do with an  episode of lithium toxicity that she had many years ago.  Today I took the time to provide the patient with information regarding my pain practice. The patient was informed that my practice is divided into two sections: an interventional pain management section, as well as a completely separate and distinct medication management section. I explained that I have procedure days for my interventional therapies, and evaluation days for follow-ups and medication management. Because of the amount of documentation required during both, they are kept separated. This means that there is the possibility that she may be scheduled for a procedure on one day, and medication management the next. I have also informed her that because of staffing and facility limitations, I no longer take patients for medication management only. To illustrate the reasons for this, I gave the patient the example of surgeons, and how inappropriate it would be to refer a patient to his/her care, just to write for the post-surgical antibiotics on a surgery done by a different surgeon.   Because interventional pain management is my board-certified specialty, the patient was informed that joining my practice means that they are open to any and all interventional therapies. I made it clear that this does not mean that they will be forced to have any procedures done. What this means is that I believe interventional therapies to be essential part of the diagnosis and proper management of chronic pain conditions. Therefore, patients not interested in these interventional alternatives will be better served under the care of  a different practitioner.  The patient was also made aware of my Comprehensive Pain Management Safety Guidelines where by joining my practice, they limit all of their nerve blocks and joint injections to those done by our practice, for as long as we are retained to manage their care.   Historic Controlled Substance Pharmacotherapy  Review  PMP and historical list of controlled substances: Alprazolam 0.5 mg 1-2 tablets by mouth daily; hydrocodone/APAP 5/325 one to 2 tablets by mouth 4 times a day when necessary for pain (last prescription written on 05/22/2016). Highest analgesic regimen found: Hydrocodone/APAP 5/325 one to 2 tablets every 6 hours when necessary for pain (40 mg/day of hydrocodone) Most recent analgesic: Hydrocodone/APAP 5/325 one to 2 tablets every 6 hours when necessary for pain (40 mg/day of hydrocodone) (last prescription written on 05/22/2016) Highest recorded MME/day: 40 mg/day MME/day: 0 mg/day Medications: The patient did not bring the medication(s) to the appointment, as requested in our "New Patient Package" Pharmacodynamics: Desired effects: Analgesia: The patient reports >50% benefit. Reported improvement in function: The patient reports medication allows her to accomplish basic ADLs. Clinically meaningful improvement in function (CMIF): Sustained CMIF goals met Perceived effectiveness: Described as relatively effective, allowing for increase in activities of daily living (ADL) Undesirable effects: Side-effects or Adverse reactions: None reported Historical Monitoring: The patient  reports that she does not use drugs.. List of all UDS Test(s): No results found for: MDMA, COCAINSCRNUR, PCPSCRNUR, PCPQUANT, CANNABQUANT, THCU, [place] List of all Serum Drug Screening Test(s):  No results found for: AMPHSCRSER, BARBSCRSER, BENZOSCRSER, COCAINSCRSER, PCPSCRSER, PCPQUANT, THCSCRSER, CANNABQUANT, OPIATESCRSER, OXYSCRSER, PROPOXSCRSER Historical Background Evaluation: [place] PDMP: Six (6) year initial data search conducted. No abnormal patterns identified.       [place] Department of public safety, offender search: Editor, commissioning Information) Non-contributory Risk Assessment Profile: Aberrant behavior: None observed or detected today Risk factors for fatal opioid overdose: Concomitant use of Benzodiazepines and COPD or  asthma Fatal overdose hazard ratio (HR): Calculation deferred Non-fatal overdose hazard ratio (HR): Calculation deferred Risk of opioid abuse or dependence: 0.7-3.0% with doses ? 36 MME/day and 6.1-26% with doses ? 120 MME/day. Substance use disorder (SUD) risk level: Pending results of Medical Psychology Evaluation for SUD Opioid risk tool (ORT) (Total Score): 0  ORT Scoring interpretation table:  Score <3 = Low Risk for SUD  Score between 4-7 = Moderate Risk for SUD  Score >8 = High Risk for Opioid Abuse   PHQ-2 Depression Scale:  Total score: 0  PHQ-2 Scoring interpretation table: (Score and probability of major depressive disorder)  Score 0 = No depression  Score 1 = 15.4% Probability  Score 2 = 21.1% Probability  Score 3 = 38.4% Probability  Score 4 = 45.5% Probability  Score 5 = 56.4% Probability  Score 6 = 78.6% Probability   PHQ-9 Depression Scale:  Total score: 0  PHQ-9 Scoring interpretation table:  Score 0-4 = No depression  Score 5-9 = Mild depression  Score 10-14 = Moderate depression  Score 15-19 = Moderately severe depression  Score 20-27 = Severe depression (2.4 times higher risk of SUD and 2.89 times higher risk of overuse)   Pharmacologic Plan: Pending ordered tests and/or consults  Meds  The patient has a current medication list which includes the following prescription(s): alprazolam, aspirin ec, bisacodyl, brimonidine tartrate-timolol, bupropion, dextromethorphan, esomeprazole, estradiol-levonorgestrel, fluticasone furoate-vilanterol, ipratropium, losartan, lumigan, nicotine polacrilex, ondansetron, ranitidine, and rosuvastatin.  Current Outpatient Prescriptions on File Prior to Visit  Medication Sig  . ALPRAZolam (XANAX) 0.5 MG tablet  Take 0.5 mg by mouth 2 (two) times daily as needed for sleep. 0.5-1 tablet  . aspirin EC 81 MG tablet Take 1 tablet (81 mg total) by mouth daily.  . bisacodyl (DULCOLAX) 5 MG EC tablet Take 5 mg by mouth daily as needed for  moderate constipation.  . Brimonidine Tartrate-Timolol (COMBIGAN OP) Apply 1 drop to eye 2 (two) times daily.  Marland Kitchen buPROPion (WELLBUTRIN SR) 150 MG 12 hr tablet Take 150 mg by mouth every morning. 2 tablets  . dextromethorphan 15 MG/5ML syrup Take 10 mLs by mouth 4 (four) times daily as needed for cough.  . esomeprazole (NEXIUM) 40 MG capsule Take by mouth.  [facility]) 0.045-0.015 MG/DAY Place 1 patch onto the skin once a week. Changed on Sundays  . fluticasone furoate-vilanterol (BREO ELLIPTA) 100-25 MCG/INH AEPB Inhale 1 puff into the lungs daily.  Marland Kitchen ipratropium (ATROVENT) 0.06 % nasal spray Place 2 sprays into both nostrils 4 (four) times daily as needed for rhinitis.  Marland Kitchen losartan (COZAAR) 25 MG tablet Take 25 mg by mouth daily.  . nicotine polacrilex (NICORETTE) 2 MG gum Take 2 mg by mouth as needed for smoking cessation.  . ondansetron (ZOFRAN) 8 MG tablet Take by mouth every 8 (eight) hours as needed for nausea or vomiting.  . ranitidine (ZANTAC) 150 MG tablet Take 150 mg by mouth daily.  . rosuvastatin (CRESTOR) 10 MG tablet Take 10 mg by mouth at bedtime.    No current facility-administered medications on file prior to visit.    Imaging Review  Cervical Imaging: Cervical MR wo contrast:  Results for orders placed in visit on 10/13/05  MR C Spine Ltd W/O Cm   Narrative * PRIOR REPORT IMPORTED FROM AN EXTERNAL SYSTEM *   PRIOR REPORT IMPORTED FROM THE [facility] [place] EXAM:  Cervical spinal stenosis  COMMENTS:   PROCEDURE:     MR  - MR CERVICAL SPINE WO CONT  - Oct 13 2005  6:11PM   RESULT:     Multiplanar/multisequence imaging of the cervical spine was  obtained without administration of gadolinium.  Evaluation of the cervical  cord demonstrates no T1 or T2 signal abnormalities.   A broad-based annular bulge is appreciated at the C3-4 level.  This causes  effacement of the anterior CSF space and encroachment upon the anterior   cord  as well as severe thecal sac stenosis.  There does not appear to be  evidence  of nerve root compression or compromise.   Endplate osteophytosis is demonstrated along the inferior endplate of C4  and  superior endplate of C5.  These areas cause partial to near effacement of  the anterior CSF space and mild to moderate thecal sac stenosis.  No  evidence of nerve root compression or compromise is appreciated.   At the C5-6 level, a broad-based annular bulge is appreciated  demonstrating  RIGHT lateralization.  This area of focal lateralization causes  encroachment  upon the exiting nerve root and nerve root compromise cannot be excluded.  There does not appear to be definitive nerve root compression.  Moderate  thecal sac stenosis is demonstrated at this level.  No further regions of  thecal sac stenosis, disk herniation, or nerve root compromise are  appreciated.   IMPRESSION:   Multilevel degenerative disk disease apparent at the C3-4 and C5-6 levels.   At the C3-4 level there is severe thecal sac stenosis without evidence of  nerve root compression or compromise.  At the C5-6 level moderate thecal sac stenosis is appreciated with RIGHT  lateralization of the broad-based annular disk bulge and encroachment with compromise of the exiting nerve root.   Note not mentioned above, the thecal sac stenosis at the C3-4 level is  multifactorial and also secondary to ligamentum flavum hypertrophy.   Thank you for this opportunity to contribute to the care of your patient.       Cervical MR w/wo contrast:  Results for orders placed during the hospital encounter of 12/20/16  MR CERVICAL SPINE W WO CONTRAST   Narrative CLINICAL DATA:  Gait imbalance, dizziness for 3 years, progressively worsening. History of C3-4 fusion 2006.  EXAM: MRI CERVICAL SPINE WITHOUT AND WITH CONTRAST  TECHNIQUE: Multiplanar and multiecho pulse sequences of the cervical spine, to include the  craniocervical junction and cervicothoracic junction, were obtained without and with intravenous contrast.  CONTRAST:  39m MULTIHANCE GADOBENATE DIMEGLUMINE 529 MG/ML IV SOLN  COMPARISON:  None.  FINDINGS: ALIGNMENT: Maintained cervical lordosis.  No malalignment.  VERTEBRAE/DISCS: Vertebral bodies are intact. Status post C3 through C5 ACDF with bridging bone marrow signal compatible with arthrodesis, unchanged. Moderate C5-6 disc height loss with proportional acute on chronic enhancing discogenic endplate changes. Multilevel disc desiccation. Included thoracic spine demonstrates severe T2-3 degenerative disc. No suspicious osseous or intradiscal enhancement.  CORD:Cervical spinal cord is normal morphology and signal characteristics from the cervicomedullary junction to level of T1-2, the most caudal well visualized level. No abnormal cord, leptomeningeal or epidural enhancement.  POSTERIOR FOSSA, VERTEBRAL ARTERIES, PARASPINAL TISSUES: No MR findings of ligamentous injury. Vertebral artery flow voids present. Included posterior fossa and paraspinal soft tissues are normal.  DISC LEVELS:  C2-3: Small broad-based disc bulge, uncovertebral hypertrophy. Severe RIGHT and moderate LEFT facet arthropathy. No canal stenosis. Moderate to severe RIGHT neural foraminal narrowing.  C3-4 and C4-5: ACDF. No canal stenosis or neural foraminal narrowing.  C5-6: Small broad-based disc bulge. Uncovertebral hypertrophy. Severe RIGHT, moderate LEFT facet arthropathy. Mild canal stenosis. Moderate RIGHT, mild to moderate LEFT neural foraminal narrowing.  C6-7: No disc bulge. Uncovertebral hypertrophy and moderate to severe facet arthropathy. No canal stenosis or neural foraminal narrowing.  C7-T1: Small broad-based disc bulge. Moderate RIGHT, mild LEFT facet arthropathy without canal stenosis or neural foraminal narrowing.  IMPRESSION: Status post C3-4 and C4-5 ACDF.  Degenerative  cervical spine, advanced facet arthropathy.  Mild canal stenosis C5-6. Multilevel neural foraminal narrowing: Moderate to severe on the RIGHT at C2-3.   Electronically Signed   By: CElon AlasM.D.   On: 12/21/2016 03:20    Cervical DG 2-3 views:  Results for orders placed in visit on 03/12/06  DG Cervical Spine 2 or 3 views   Narrative * PRIOR REPORT IMPORTED FROM AN EXTERNAL SYSTEM *   PRIOR REPORT IMPORTED FROM THE [facility] WORKFLOW SYSTEM   REASON FOR EXAM:  Status post ACDF C3-4 (fax to [phone]  COMMENTS:   PROCEDURE:     DXR - DXR C- SPINE AP AND LATERAL  - Mar 12 2006  8:19AM   RESULT:     AP, lateral and odontoid views of the cervical spine were  obtained.  The vertebral body heights are well maintained.  A metallic  plate  is visualized anteriorly at the C3-C4 level consistent with anterior  fusion.   The intervertebral disk space at C3-C4 is of normal height.  There is  noted  slight narrowing of the C4-C5 cervical disk space suspicious for disk  disease at  this level.  The disk space at C5-C6 appears narrowed  posteriorly  which could represent early manifestation of disk disease at this level.  The vertebral body alignment is normal.  The odontoid process is intact.   No  cervical rib formation is seen.   IMPRESSION:   Postoperative changes are noted at C3-C4.   The vertebral body alignment is normal.   There is observed narrowing of the intervertebral disk spaces at C4-C5 and C5-C6 suspicious for early manifestation of disk disease at these levels.   There is slight hypertrophic spurring anteriorly at C5 and C6.   Thank you for this opportunity to contribute to the care of your patient.       Thoracic Imaging: Thoracic CT w/wo contrast:  Results for orders placed in visit on 03/03/16  CT T Spine Ltd Wo Or W/ Cm   Narrative * PRIOR REPORT IMPORTED FROM AN EXTERNAL SYSTEM *   PRIOR REPORT IMPORTED FROM THE [facility] WORKFLOW SYSTEM   REASON  FOR EXAM:  Chronic pancreatitis.    Splanchnic nerve block  COMMENTS:   PROCEDURE:     CT  - CT THORACIC SPINE WO  - Sep 19 2005  9:27AM   RESULT:     A paraspinal needle is noted adjacent to the lower thoracic  spine.  The needle does not cross the pleura.  No adjacent soft tissue or  bony abnormalities are noted.   IMPRESSION:   Please see above.   Thank you for this opportunity to contribute to the care of your patient.       Lumbosacral Imaging: Lumbar MR wo contrast:  Results for orders placed in visit on 09/02/05  MR L Spine Ltd W/O Cm   Narrative * PRIOR REPORT IMPORTED FROM AN EXTERNAL SYSTEM *   PRIOR REPORT IMPORTED FROM THE [facility] WORKFLOW SYSTEM   REASON FOR EXAM:  L5 radiculopathy  COMMENTS:   PROCEDURE:     MR  - MR LUMBAR SPINE WO CONTRAST  - Sep 02 2005  6:00PM   RESULT:     Multiplanar/multisequence imaging of the lumbar spine was  obtained without administration of gadolinium.   The conus medullaris terminates at the L1-2 level.  The cauda equina  demonstrates no evidence of clumping or thickening.   Broad-based annular bulge is appreciated at the L1-2 level demonstrating  lateralization to the RIGHT and LEFT.  There does not appear to be  evidence  of significant thecal sac stenosis or nerve root compression or  compromise.  There is evidence of narrowing of the neural foramina inferiorly.  A  broad-based annular bulge is appreciated at the L2-3 level with primarily  LEFT lateralization.  There is mild narrowing of the lateral recess and  neural foraminal along the inferior portion of the neural foramina on the  LEFT.  No evidence of nerve root compression or compromise is identified.  At the L3-4 level, a broad-based annular bulge is appreciated causing  partial effacement of the anterior CSF space and moderate thecal sac  stenosis.  No evidence of nerve root compression or compromise is  appreciated.  Narrowing of the lateral recess on the RIGHT  and LEFT is  appreciated without evidence of nerve root compression or compromise.  A  broad-based annular bulge is appreciated at the L4-5 level.  This  demonstrates RIGHT lateralization and narrowing of the lateral recess on  the  RIGHT. Mild to moderate thecal sac stenosis is appreciated.  There appears  to  be encroachment upon the nerve root inferiorly without evidence of  definitive nerve root compression.  The thecal sac stenosis at this level  is  multifactorial and also secondary to ligamentum flavum and facet  hypertrophy.   Note at the L3-4 level, the thecal sac stenosis is also multifactorial and  also secondary to ligamentum flavum and facet hypertrophy.   This study was compared to a previous MRI of the lumbar spine dated  01/27/03.  The findings described at the L3-4 level, the area of thecal sac stenosis  has increased mild to moderately when compared to the previous study.  The  remaining findings appear to be otherwise stable.   IMPRESSION:   Multilevel disk bulges as described above which appears to be most severe  at the L3-4 level.  There is moderate thecal sac stenosis at this level which  is multifactorial.   A broad-based annular disk bulge is appreciated at the L4-5 level as  described above with RIGHT lateralization.  There is mild to moderate  thecal sac stenosis at this level and what appears to be possible encroachment upon the exiting nerve root on the RIGHT.  Definitive nerve root compression cannot be excluded. There is narrowing of the lateral recess on the RIGHT with evidence of possible compromise with the cauda equina in this area. This was not mentioned in the above.  The thecal sac stenosis at this level is also multifactorial.   Subligamentous disk bulges are appreciated at the L1-2 and L2-3 level.   Note the area of moderate thecal sac stenosis is at the L3-4 level.       Note: Available results from prior imaging studies were reviewed.         ROS  Cardiovascular History: Daily Aspirin intake, Hypertension and Chest pain Pulmonary or Respiratory History: Emphysema and Shortness of breath Neurological History: Scoliosis Review of Past Neurological Studies:  Results for orders placed or performed during the hospital encounter of 12/20/16  MR BRAIN W WO CONTRAST   Narrative   CLINICAL DATA:  71 year old female. Pituitary microadenoma. Increased dizziness, progressive balance issues for 3 years with falls, stumbling. Prior cervical spine surgery. Personal history of breast cancer. Initial encounter.  EXAM: MRI HEAD WITHOUT AND WITH CONTRAST  TECHNIQUE: Multiplanar, multiecho pulse sequences of the brain and surrounding structures were obtained without and with intravenous contrast.  CONTRAST:  5 mL MultiHance in conjunction with contrast enhanced imaging of the cervical spine reported separately.  COMPARISON:  Cervical spine MRI from today reported separately. Report of brain and pituitary MRI in 09/06/2003 and brain MRI 09/11/2003 (no images available).  FINDINGS: Brain: No restricted diffusion to suggest acute infarction. No midline shift, mass effect, evidence of mass lesion, ventriculomegaly, extra-axial collection or acute intracranial hemorrhage. Cervicomedullary junction within normal limits.  Patchy and confluent bilateral cerebral white matter T2 and FLAIR hyperintensity, most pronounced in the frontal lobes. No cortical encephalomalacia or chronic cerebral blood products. Mild T2 heterogeneity in the deep gray matter nuclei. More patchy T2 heterogeneity throughout the pons. The cerebellum is normal. No abnormal enhancement identified.  Vascular: Major intracranial vascular flow voids are preserved, with dominant appearing distal left vertebral artery. Incidental pneumatized right anterior clinoid process suspected.  Skull and upper cervical spine: Partially visible ACDF, cervical spine today reported  separately.  Sinuses/Orbits: Postoperative changes to both globes, otherwise negative orbits soft tissues.  Paranasal sinuses and mastoids are clear. Visible internal auditory structures appear normal. Negative scalp soft tissues.  Other: Dedicated pituitary  imaging. Overall normal pituitary size and configuration. Normal infundibulum. No suprasellar mass or mass effect. Normal hypothalamus. The cavernous sinuses appear normal. Following contrast there is mildly heterogeneous enhancement of the gland, but no discrete pituitary mass or lesion.  IMPRESSION: 1. MRI appearance of the pituitary is within normal limits. 2.  No metastatic disease or acute intracranial abnormality. 3. Advanced but nonspecific signal changes in the cerebral white matter and pons, most commonly due to chronic small vessel disease. 4. Cervical spine MRI today reported separately.   Electronically Signed   By: Genevie Ann M.D.   On: 12/21/2016 11:54    Psychological-Psychiatric History: Anxiety and Depression Gastrointestinal History: Ulcers, Reflux or heatburn, Pancreatitis and Constipation Genitourinary History: Kidney disease Hematological History: Anemia, Brusing easily and Bleeding easily Endocrine History: Negative for diabetes or thyroid disease Rheumatologic History: Osteoarthritis Musculoskeletal History: Negative for myasthenia gravis, muscular dystrophy, multiple sclerosis or malignant hyperthermia Work History: Quit going to work on his/her own  Allergies  Elaine Clayton is allergic to influenza vaccines; ciprofloxacin; flu virus vaccine; sulfa antibiotics; and tetracyclines & related.  Laboratory Chemistry  Inflammation Markers Lab Results  Component Value Date   ESRSEDRATE 4 03/19/2017   (CRP: Acute Phase) (ESR: Chronic Phase) Renal Function Markers Lab Results  Component Value Date   BUN 12 03/19/2017   CREATININE 0.99 03/19/2017   GFRAA >60 03/19/2017   GFRNONAA 57 (L) 03/19/2017    Hepatic Function Markers Lab Results  Component Value Date   AST 22 03/19/2017   ALT 14 03/19/2017   ALBUMIN 4.5 03/19/2017   ALKPHOS 53 03/19/2017   Electrolytes Lab Results  Component Value Date   NA 139 03/19/2017   K 4.0 03/19/2017   CL 107 03/19/2017   CALCIUM 9.5 03/19/2017   MG 2.1 03/19/2017   Neuropathy Markers No results found for: [id number] Bone Pathology Markers Lab Results  Component Value Date   ALKPHOS 53 03/19/2017   CALCIUM 9.5 03/19/2017   Coagulation Parameters Lab Results  Component Value Date   PLT 206 06/25/2015   Cardiovascular Markers Lab Results  Component Value Date   HGB 10.0 (L) 06/25/2015   HCT 31.1 (L) 06/25/2015   Note: Lab results reviewed.  [place]  Drug: Elaine Clayton  reports that she does not use drugs. Alcohol:  reports that she drinks alcohol. Tobacco:  reports that she quit smoking about 9 years ago. She has a 35.00 Clayton-year smoking history. She has never used smokeless tobacco. Medical:  has a past medical history of Anemia; Bipolar affective disorder ([place]); Broken foot (2015); Chronic gastritis; Chronic kidney disease; COPD (chronic obstructive pulmonary disease) ([place]); Dizziness; Dysphagia; Glaucoma; Hyperlipemia; Hypertension; Lithium toxicity (2015); Malignant neoplasm of upper-outer quadrant of female breast [facility]) (May 07, 2007); Neck stiffness; Osteoarthritis; Pancreatitis; Personal history of malignant neoplasm of breast; Pituitary microadenoma ([place]); Recurrent UTI; Renal insufficiency; and Stomach ulcer (2112). Family: family history includes Breast cancer (age of onset: 81) in her mother.  Past Surgical History:  Procedure Laterality Date  . APPENDECTOMY    . BREAST BIOPSY Right 2011   neg  . BREAST EXCISIONAL BIOPSY Right 2001   neg  . BREAST EXCISIONAL BIOPSY Right 2008   breast ca 2008 radiation  . BREAST EXCISIONAL BIOPSY Left 10/26/2012   neg  . BREAST SURGERY Left  2013   left breast wide  excision,Intraductal papilloma, ductal hyperplasia and sclerosing adenosis. Microcalcifications associated with columnar cell change. No evidence of atypia or malignancy. Margins are unremarkable.  Marland Kitchen BREAST SURGERY Right  November 06, 1999   multiple areas of microcalcification showing evidence of sclerosing adenosis and ductal hyperplasia.  Marland Kitchen BREAST SURGERY Left May 07, 2007   wide excision.  . cataract Right 2013  . CATARACT EXTRACTION W/PHACO Left 03/05/2016   Procedure: CATARACT EXTRACTION PHACO AND INTRAOCULAR LENS PLACEMENT (IOC);  Surgeon: Leandrew Koyanagi, MD;  Location: [place];  Service: Ophthalmology;  Laterality: Left;  . COLONOSCOPY  2004  . COLONOSCOPY N/A 05/14/2015   Procedure: COLONOSCOPY;  Surgeon: Manya Silvas, MD;  Location[facility] ENDOSCOPY;  Service: Endoscopy;  Laterality: N/A;  . ERCP N/A   . ESOPHAGOGASTRODUODENOSCOPY N/A 05/14/2015   Procedure: ESOPHAGOGASTRODUODENOSCOPY (EGD);  Surgeon: Manya Silvas, MD;  Location: [facility] ENDOSCOPY;  Service: Endoscopy;  Laterality: N/A;  . EUS N/A 2011, 2012  . EYE SURGERY  2013  . GLAUCOMA SURGERY Right 2013  . LAPAROSCOPIC RIGHT COLECTOMY Right 06/22/2015   Procedure: LAPAROSCOPIC RIGHT COLECTOMY;  Surgeon: Robert Bellow, MD;  Location: [facility] ORS;  Service: General;  Laterality: Right;  . NECK SURGERY  2006  . right breast cancer    . SAVORY DILATION N/A 05/14/2015   Procedure: SAVORY DILATION;  Surgeon: Manya Silvas, MD;  Location: [facility] ENDOSCOPY;  Service: Endoscopy;  Laterality: N/A;  . TRABECULECTOMY Left 03/05/2016   Procedure: TRABECULECTOMY WITH [facility] AND EXPRESS SHUNT;  Surgeon: Leandrew Koyanagi, MD;  Location: [place];  Service: Ophthalmology;  Laterality: Left;  . TUBAL LIGATION     Active Ambulatory Problems    Diagnosis Date Noted  . Breast cancer of upper-outer quadrant of right female breast ([place]) 05/31/2008  . Personal history of malignant neoplasm of breast 07/18/2014  . Colon  polyp 05/25/2015  . Loss of weight 08/15/2015  . Cervical spine disease 12/08/2016  . Pituitary microadenoma ([place]) 12/08/2016  . History of breast cancer in adulthood 12/08/2016  . Dysmetria 12/08/2016  . Breast cancer ([place]) 06/28/2014  . Bipolar disorder ([place]) 06/28/2014  . Chronic pancreatitis ([place]) 06/28/2014  . COPD (chronic obstructive pulmonary disease) ([place]) 05/17/2014  . HTN (hypertension) 06/28/2014  . OA (osteoarthritis) 06/28/2014  . Renal insufficiency 06/28/2014  . Chronic pain syndrome 03/19/2017  . Chronic shoulder pain (Location of Primary Source of Pain) (Bilateral) (L>R) 03/19/2017  . Osteoarthritis of shoulder (Bilateral) 03/19/2017  . Chronic neck pain (Location of Secondary source of pain) (Bilateral) (L>R) 03/19/2017  . History of C3-5 ACDF 03/19/2017  . Numbness and tingling in left upper extremity 03/19/2017  . Numbness and tingling of right upper extremity 03/19/2017  . Upper extremity weakness (Bilateral) (L>R) 03/19/2017  . Numbness of upper extremity (Bilateral) (L>R) 03/19/2017  . Numbness and tingling of lower extremity (Bilateral) (R>L) 03/19/2017  . Weakness of lower extremity (Bilateral) (R>L) 03/19/2017  . Chronic abdominal pain (epigastric) 03/19/2017  . Cervical syndrome 03/19/2017   Resolved Ambulatory Problems    Diagnosis Date Noted  . No Resolved Ambulatory Problems   Past Medical History:  Diagnosis Date  . Anemia   . Bipolar affective disorder ([place])   . Broken foot 2015  . Chronic gastritis   . Chronic kidney disease   . COPD (chronic obstructive pulmonary disease) ([place])   . Dizziness   . Dysphagia   . Glaucoma   . Hyperlipemia   . Hypertension   . Lithium toxicity 2015  . Malignant neoplasm of upper-outer quadrant of female breast [facility]) May 07, 2007  . Neck stiffness   . Osteoarthritis   . Pancreatitis   . Personal history of  malignant neoplasm of breast   . Pituitary microadenoma ([place])   . Recurrent UTI   . Renal  insufficiency   . Stomach ulcer 2112   Constitutional Exam  General appearance: Well nourished, well developed, and well hydrated. In no apparent acute distress Vitals:   03/19/17 1132  BP: (!) 141/89  Pulse: 76  Resp: 18  Temp: 98.2 F (36.8 C)  TempSrc: Oral  SpO2: 100%  Weight: 100 lb (45.4 kg)  Height: 5' 4"  (1.626 m)   BMI Assessment: Estimated body mass index is 17.16 kg/m as calculated from the following:   Height as of this encounter: 5' 4"  (1.626 m).   Weight as of this encounter: 100 lb (45.4 kg).  BMI interpretation table: BMI level Category Range association with higher incidence of chronic pain  <18 kg/m2 Underweight   18.5-24.9 kg/m2 Ideal body weight   25-29.9 kg/m2 Overweight Increased incidence by 20%  30-34.9 kg/m2 Obese (Class I) Increased incidence by 68%  35-39.9 kg/m2 Severe obesity (Class II) Increased incidence by 136%  >40 kg/m2 Extreme obesity (Class III) Increased incidence by 254%   BMI Readings from Last 4 Encounters:  03/19/17 17.16 kg/m  02/09/17 17.68 kg/m  12/08/16 17.16 kg/m  07/07/16 16.99 kg/m   Wt Readings from Last 4 Encounters:  03/19/17 100 lb (45.4 kg)  02/09/17 103 lb (46.7 kg)  12/08/16 100 lb (45.4 kg)  07/07/16 99 lb (44.9 kg)  Psych/Mental status: Alert, oriented x 3 (person, place, & time)       Eyes: PERLA Respiratory: No evidence of acute respiratory distress  Cervical Spine Exam  Inspection: No masses, redness, or swelling Alignment: Symmetrical Functional ROM: Unrestricted ROM Stability: No instability detected Muscle strength & Tone: Functionally intact Sensory: Unimpaired Palpation: No palpable anomalies  Upper Extremity (UE) Exam    Side: Right upper extremity  Side: Left upper extremity  Inspection: No masses, redness, swelling, or asymmetry. No contractures  Inspection: No masses, redness, swelling, or asymmetry. No contractures  Functional ROM: Unrestricted ROM          Functional ROM: Unrestricted  ROM          Muscle strength & Tone: Functionally intact  Muscle strength & Tone: Functionally intact  Sensory: Unimpaired  Sensory: Unimpaired  Palpation: No palpable anomalies  Palpation: No palpable anomalies  Specialized Test(s): Deferred         Specialized Test(s): Deferred          Thoracic Spine Exam  Inspection: No masses, redness, or swelling Alignment: Symmetrical Functional ROM: Unrestricted ROM Stability: No instability detected Sensory: Unimpaired Muscle strength & Tone: No palpable anomalies  Lumbar Spine Exam  Inspection: No masses, redness, or swelling Alignment: Symmetrical Functional ROM: Unrestricted ROM Stability: No instability detected Muscle strength & Tone: Functionally intact Sensory: Unimpaired Palpation: No palpable anomalies Provocative Tests: Lumbar Hyperextension and rotation test: evaluation deferred today       Patrick's Maneuver: evaluation deferred today              Gait & Posture Assessment  Ambulation: Unassisted Gait: Relatively normal for age and body habitus Posture: WNL   Lower Extremity Exam    Side: Right lower extremity  Side: Left lower extremity  Inspection: No masses, redness, swelling, or asymmetry. No contractures  Inspection: No masses, redness, swelling, or asymmetry. No contractures  Functional ROM: Unrestricted ROM          Functional ROM: Unrestricted ROM  Muscle strength & Tone: Functionally intact  Muscle strength & Tone: Functionally intact  Sensory: Unimpaired  Sensory: Unimpaired  Palpation: No palpable anomalies  Palpation: No palpable anomalies   Assessment  Primary Diagnosis & Pertinent Problem List: The primary encounter diagnosis was Chronic pain syndrome. Diagnoses of Chronic shoulder pain (Location of Primary Source of Pain) (Bilateral) (L>R), Osteoarthritis of shoulder (Bilateral), Chronic neck pain (Location of Secondary source of pain) (Bilateral) (L>R), History of C3-5 ACDF, Numbness and tingling  in left upper extremity, Numbness and tingling of right upper extremity, Upper extremity weakness (Bilateral) (L>R), Numbness of upper extremity (Bilateral) (L>R), Numbness and tingling of lower extremity (Bilateral) (R>L), Weakness of both lower extremities, Chronic abdominal pain (epigastric), and Cervical syndrome were also pertinent to this visit.  Visit Diagnosis: 1. Chronic pain syndrome   2. Chronic shoulder pain (Location of Primary Source of Pain) (Bilateral) (L>R)   3. Osteoarthritis of shoulder (Bilateral)   4. Chronic neck pain (Location of Secondary source of pain) (Bilateral) (L>R)   5. History of C3-5 ACDF   6. Numbness and tingling in left upper extremity   7. Numbness and tingling of right upper extremity   8. Upper extremity weakness (Bilateral) (L>R)   9. Numbness of upper extremity (Bilateral) (L>R)   10. Numbness and tingling of lower extremity (Bilateral) (R>L)   11. Weakness of both lower extremities   12. Chronic abdominal pain (epigastric)   13. Cervical syndrome    Plan of Care  Initial treatment plan:  Please be advised that as per protocol, today's visit has been an evaluation only. We have not taken over the patient's controlled substance management.  Problem-specific plan: No problem-specific Assessment & Plan notes found for this encounter.  Ordered Lab-work, Procedure(s), Referral(s), & Consult(s): Orders Placed This Encounter  Procedures  . DG Shoulder Left  . DG Shoulder Right  . Comprehensive metabolic panel  . C-reactive protein  . Magnesium  . Sedimentation rate  . Vitamin B12  . 25-Hydroxyvitamin D Lcms D2+D3  . NCV with EMG(electromyography)  . NCV with EMG(electromyography)   Pharmacotherapy: Medications ordered:  No orders of the defined types were placed in this encounter.  Medications administered during this visit: Elaine Clayton had no medications administered during this visit.   Pharmacotherapy under consideration:  Opioid  Analgesics: The patient was informed that there is no guarantee that she would be a candidate for opioid analgesics. The decision will be made following CDC guidelines. This decision will be based on the results of diagnostic studies, as well as Elaine Clayton's risk profile.  Membrane stabilizer: To be determined at a later time Muscle relaxant: To be determined at a later time NSAID: To be determined at a later time Other analgesic(s): To be determined at a later time   Interventional therapies under consideration: Elaine Clayton was informed that there is no guarantee that she would be a candidate for interventional therapies. The decision will be based on the results of diagnostic studies, as well as Elaine Clayton's risk profile.  Possible procedure(s): Diagnostic cervical epidural steroid injection under fluoroscopic guidance    Provider-requested follow-up: Return in about 1 month (around 04/19/2017) for 2nd Visit, test result(s).  Future Appointments Date Time Provider [place]  04/14/2017 10:30 AM Milinda Pointer, MD [facility] None  05/04/2017 4:15 PM Wilhelmina Mcardle, MD [facility] None  02/10/2018 1:30 PM Larey Seat, MD [facility] None    Primary Care Physician: Idelle Crouch, MD Location: [facility] Note by:  Gerline Ratto A. Dossie Arbour, M.D, DABA, DABAPM, DABPM, DABIPP, FIPP Date: 03/19/2017; Time: 3:31 PM  Pain Score Disclaimer: We use the NRS-11 scale. This is a self-reported, subjective measurement of pain severity with only modest accuracy. It is used primarily to identify changes within a particular patient. It must be understood that outpatient pain scales are significantly less accurate that those used for research, where they can be applied under ideal controlled circumstances with minimal exposure to variables. In reality, the score is likely to be a combination of pain intensity and pain affect, where pain affect describes the degree of emotional  arousal or changes in action readiness caused by the sensory experience of pain. Factors such as social and work situation, setting, emotional state, anxiety levels, expectation, and prior pain experience may influence pain perception and show large inter-individual differences that may also be affected by time variables.  Patient instructions provided during this appointment: There are no Patient Instructions on file for this visit.

## 2017-03-23 LAB — 25-HYDROXYVITAMIN D LCMS D2+D3: 25-HYDROXY, VITAMIN D-3: 56 ng/mL

## 2017-03-23 LAB — 25-HYDROXY VITAMIN D LCMS D2+D3
25-Hydroxy, Vitamin D-2: 1.2 ng/mL
25-Hydroxy, Vitamin D: 57 ng/mL

## 2017-04-14 ENCOUNTER — Ambulatory Visit: Payer: Medicare Other | Admitting: Pain Medicine

## 2017-04-20 ENCOUNTER — Other Ambulatory Visit: Payer: Self-pay

## 2017-04-20 DIAGNOSIS — Z1231 Encounter for screening mammogram for malignant neoplasm of breast: Secondary | ICD-10-CM

## 2017-04-22 ENCOUNTER — Encounter: Payer: Self-pay | Admitting: Neurology

## 2017-05-04 ENCOUNTER — Ambulatory Visit (INDEPENDENT_AMBULATORY_CARE_PROVIDER_SITE_OTHER): Payer: Medicare Other | Admitting: Pulmonary Disease

## 2017-05-04 ENCOUNTER — Encounter: Payer: Self-pay | Admitting: Pulmonary Disease

## 2017-05-04 VITALS — BP 126/70 | HR 65 | Ht 64.0 in

## 2017-05-04 DIAGNOSIS — J449 Chronic obstructive pulmonary disease, unspecified: Secondary | ICD-10-CM | POA: Diagnosis not present

## 2017-05-04 NOTE — Patient Instructions (Signed)
Continue Breo Continue albuterol as needed Follow-up in 12 months

## 2017-05-05 NOTE — Progress Notes (Signed)
PULMONARY OFFICE FOLLOW UP - COPD  Smoking status: Remote Significant occupational/environmetal exposures: none  CXR findings:  Date:  11/21/15 Findings: mod-severe hyperinflation and hyperlucency  PFTs: 10/17/15   FVC:  2.18   69 % pred   FEV1: 1.26     52% pred   FEV1%:    58%   TLC: 6.17 120 % pred   RV:   3.83 177% pred   DlCO   38% pred   Current pulmonary medications: Breo, albuterol PRN  Oxygen: No  Other medical problems: Past Medical History:  Diagnosis Date  . Anemia   . Bipolar affective disorder ([place])    2  . Broken foot 2015   right foot  . Chronic gastritis   . Chronic kidney disease    Dr Holley Raring  . COPD (chronic obstructive pulmonary disease) ([place])   . Dizziness    inner ear (per pt) several times per week  . Dysphagia   . Glaucoma   . Hyperlipemia   . Hypertension    pt has recently come off BP meds.  MD aware. BP seems stable.  . Lithium toxicity 2015  . Malignant neoplasm of upper-outer quadrant of female breast [facility]) May 07, 2007   tubular carcinoma, 1 mm, T1a,Nx  . Neck stiffness    s/p C3-C4 fusion, limited right turn  . Osteoarthritis    back  . Pancreatitis   . Personal history of malignant neoplasm of breast   . Pituitary microadenoma ([place])   . Recurrent UTI   . Renal insufficiency   . Stomach ulcer 2112   Past Surgical History:  Procedure Laterality Date  . APPENDECTOMY    . BREAST BIOPSY Right 2011   neg  . BREAST EXCISIONAL BIOPSY Right 2001   neg  . BREAST EXCISIONAL BIOPSY Right 2008   breast ca 2008 radiation  . BREAST EXCISIONAL BIOPSY Left 10/26/2012   neg  . BREAST SURGERY Left  2013   left breast wide excision,Intraductal papilloma, ductal hyperplasia and sclerosing adenosis. Microcalcifications associated with columnar cell change. No evidence of atypia or malignancy. Margins are unremarkable.  Marland Kitchen BREAST SURGERY Right November 06, 1999   multiple areas of microcalcification showing evidence of sclerosing adenosis and  ductal hyperplasia.  Marland Kitchen BREAST SURGERY Left May 07, 2007   wide excision.  . cataract Right 2013  . CATARACT EXTRACTION W/PHACO Left 03/05/2016   Procedure: CATARACT EXTRACTION PHACO AND INTRAOCULAR LENS PLACEMENT (IOC);  Surgeon: Leandrew Koyanagi, MD;  Location: [place];  Service: Ophthalmology;  Laterality: Left;  . COLONOSCOPY  2004  . COLONOSCOPY N/A 05/14/2015   Procedure: COLONOSCOPY;  Surgeon: Manya Silvas, MD;  Location: [facility] ENDOSCOPY;  Service: Endoscopy;  Laterality: N/A;  . ERCP N/A   . ESOPHAGOGASTRODUODENOSCOPY N/A 05/14/2015   Procedure: ESOPHAGOGASTRODUODENOSCOPY (EGD);  Surgeon: Manya Silvas, MD;  Location: [facility] ENDOSCOPY;  Service: Endoscopy;  Laterality: N/A;  . EUS N/A 2011, 2012  . EYE SURGERY  2013  . GLAUCOMA SURGERY Right 2013  . LAPAROSCOPIC RIGHT COLECTOMY Right 06/22/2015   Procedure: LAPAROSCOPIC RIGHT COLECTOMY;  Surgeon: Robert Bellow, MD;  Location: [facility] ORS;  Service: General;  Laterality: Right;  . NECK SURGERY  2006  . right breast cancer    . SAVORY DILATION N/A 05/14/2015   Procedure: SAVORY DILATION;  Surgeon: Manya Silvas, MD;  Location: [facility] ENDOSCOPY;  Service: Endoscopy;  Laterality: N/A;  . TRABECULECTOMY Left 03/05/2016   Procedure: TRABECULECTOMY WITH [facility] AND EXPRESS SHUNT;  Surgeon:  Leandrew Koyanagi, MD;  Location: [place];  Service: Ophthalmology;  Laterality: Left;  . TUBAL LIGATION       SUBJECTIVE Has had a rough year since last visit but none of it related to pulmonary problems. No new respiratory complaints. Remains active with only mild DOE. Remains on Breo inhaler and believes it is beneficial. Rarely uses albuterol rescue inhaler  Dyspnea: Mild Cough: No  Sputum: No Hemoptysis: No Chest pain: Occasional, fleeting, sharp LE edema: No   Freq of rescue MDI use: None   OBJECTIVE Vitals:   05/04/17 1616 05/04/17 1622  BP:  126/70  Pulse:  65  SpO2:  97%  Height: 5\' 4"  (1.626 m)      Gen: WDWN in NAD HEENT: All WNL Neck: NO LAN, no JVD noted Thorax/Lungs: mild kyphoscoliosis, moderately diminished BS, hyperresonant to percussion, no wheezes Cardiovascular: Reg, no M noted Abdomen: Soft, NT +BS Ext: no C/C/E   DATA: No new fCXR  IMPRESSION: COPD - emphysema. Moderate obstruction. Significant gas-trapping. 9% improvement in FEV1 after BD therapy  Symptomatically improved with Breo  DISCUSSION:  PLAN:  Cont Breo 100/25 - one inhalation daily.  Continue albuterol PRN F/U in 12 months   Merton Border, MD PCCM service Mobile [phone] Pager [phone] 05/05/2017 9:33 PM

## 2017-05-06 ENCOUNTER — Encounter: Payer: Self-pay | Admitting: Pain Medicine

## 2017-05-06 ENCOUNTER — Ambulatory Visit: Payer: Medicare Other | Attending: Pain Medicine | Admitting: Pain Medicine

## 2017-05-06 VITALS — BP 124/62 | HR 75 | Temp 97.9°F | Resp 18 | Ht 64.0 in | Wt 100.0 lb

## 2017-05-06 DIAGNOSIS — M19011 Primary osteoarthritis, right shoulder: Secondary | ICD-10-CM | POA: Insufficient documentation

## 2017-05-06 DIAGNOSIS — R200 Anesthesia of skin: Secondary | ICD-10-CM | POA: Insufficient documentation

## 2017-05-06 DIAGNOSIS — R531 Weakness: Secondary | ICD-10-CM | POA: Diagnosis not present

## 2017-05-06 DIAGNOSIS — M542 Cervicalgia: Secondary | ICD-10-CM | POA: Diagnosis not present

## 2017-05-06 DIAGNOSIS — M5416 Radiculopathy, lumbar region: Secondary | ICD-10-CM | POA: Insufficient documentation

## 2017-05-06 DIAGNOSIS — K859 Acute pancreatitis without necrosis or infection, unspecified: Secondary | ICD-10-CM | POA: Diagnosis not present

## 2017-05-06 DIAGNOSIS — R29898 Other symptoms and signs involving the musculoskeletal system: Secondary | ICD-10-CM | POA: Diagnosis not present

## 2017-05-06 DIAGNOSIS — Z86010 Personal history of colonic polyps: Secondary | ICD-10-CM | POA: Diagnosis not present

## 2017-05-06 DIAGNOSIS — J449 Chronic obstructive pulmonary disease, unspecified: Secondary | ICD-10-CM | POA: Insufficient documentation

## 2017-05-06 DIAGNOSIS — M4722 Other spondylosis with radiculopathy, cervical region: Secondary | ICD-10-CM | POA: Diagnosis not present

## 2017-05-06 DIAGNOSIS — M5441 Lumbago with sciatica, right side: Secondary | ICD-10-CM

## 2017-05-06 DIAGNOSIS — M4802 Spinal stenosis, cervical region: Secondary | ICD-10-CM | POA: Insufficient documentation

## 2017-05-06 DIAGNOSIS — M531 Cervicobrachial syndrome: Secondary | ICD-10-CM | POA: Diagnosis not present

## 2017-05-06 DIAGNOSIS — R42 Dizziness and giddiness: Secondary | ICD-10-CM | POA: Diagnosis not present

## 2017-05-06 DIAGNOSIS — Z853 Personal history of malignant neoplasm of breast: Secondary | ICD-10-CM | POA: Diagnosis not present

## 2017-05-06 DIAGNOSIS — G8929 Other chronic pain: Secondary | ICD-10-CM

## 2017-05-06 DIAGNOSIS — R937 Abnormal findings on diagnostic imaging of other parts of musculoskeletal system: Secondary | ICD-10-CM | POA: Insufficient documentation

## 2017-05-06 DIAGNOSIS — C50411 Malignant neoplasm of upper-outer quadrant of right female breast: Secondary | ICD-10-CM | POA: Insufficient documentation

## 2017-05-06 DIAGNOSIS — M4682 Other specified inflammatory spondylopathies, cervical region: Secondary | ICD-10-CM | POA: Insufficient documentation

## 2017-05-06 DIAGNOSIS — Z981 Arthrodesis status: Secondary | ICD-10-CM | POA: Diagnosis not present

## 2017-05-06 DIAGNOSIS — M25512 Pain in left shoulder: Secondary | ICD-10-CM

## 2017-05-06 DIAGNOSIS — K861 Other chronic pancreatitis: Secondary | ICD-10-CM | POA: Insufficient documentation

## 2017-05-06 DIAGNOSIS — M19012 Primary osteoarthritis, left shoulder: Secondary | ICD-10-CM | POA: Insufficient documentation

## 2017-05-06 DIAGNOSIS — M4692 Unspecified inflammatory spondylopathy, cervical region: Secondary | ICD-10-CM

## 2017-05-06 DIAGNOSIS — M5412 Radiculopathy, cervical region: Secondary | ICD-10-CM | POA: Insufficient documentation

## 2017-05-06 DIAGNOSIS — G894 Chronic pain syndrome: Secondary | ICD-10-CM | POA: Insufficient documentation

## 2017-05-06 DIAGNOSIS — M5116 Intervertebral disc disorders with radiculopathy, lumbar region: Secondary | ICD-10-CM | POA: Insufficient documentation

## 2017-05-06 DIAGNOSIS — N289 Disorder of kidney and ureter, unspecified: Secondary | ICD-10-CM | POA: Diagnosis not present

## 2017-05-06 DIAGNOSIS — M25511 Pain in right shoulder: Secondary | ICD-10-CM

## 2017-05-06 DIAGNOSIS — D352 Benign neoplasm of pituitary gland: Secondary | ICD-10-CM | POA: Diagnosis not present

## 2017-05-06 DIAGNOSIS — M5442 Lumbago with sciatica, left side: Secondary | ICD-10-CM | POA: Diagnosis not present

## 2017-05-06 DIAGNOSIS — I10 Essential (primary) hypertension: Secondary | ICD-10-CM | POA: Diagnosis not present

## 2017-05-06 DIAGNOSIS — R202 Paresthesia of skin: Secondary | ICD-10-CM

## 2017-05-06 DIAGNOSIS — Z7982 Long term (current) use of aspirin: Secondary | ICD-10-CM | POA: Diagnosis not present

## 2017-05-06 DIAGNOSIS — S92309A Fracture of unspecified metatarsal bone(s), unspecified foot, initial encounter for closed fracture: Secondary | ICD-10-CM | POA: Insufficient documentation

## 2017-05-06 DIAGNOSIS — M48062 Spinal stenosis, lumbar region with neurogenic claudication: Secondary | ICD-10-CM

## 2017-05-06 NOTE — Patient Instructions (Signed)

## 2017-05-06 NOTE — Progress Notes (Signed)
Safety precautions to be maintained throughout the outpatient stay will include: orient to surroundings, keep bed in low position, maintain call bell within reach at all times, provide assistance with transfer out of bed and ambulation.  

## 2017-05-06 NOTE — Progress Notes (Signed)
Patient's Name: Elaine Clayton  MRN: [id number]  Referring Provider: Idelle Crouch, MD  DOB: 09-11-1946  PCP: Idelle Crouch, MD  DOS: 05/06/2017  Note by: Kathlen Brunswick. Dossie Arbour, MD  Service setting: Ambulatory outpatient  Specialty: Interventional Pain Management  Location: [facility] (AMB) Pain Management Facility    Patient type: Established   Primary Reason(s) for Visit: Encounter for evaluation before starting new chronic pain management plan of care (Level of risk: moderate) CC: Neck Pain  HPI  Elaine Clayton is a 71 y.o. year old, female patient, who comes today for a follow-up evaluation to review the test results and decide on a treatment plan. She has Breast cancer of upper-outer quadrant of right female breast ([place]); Personal history of malignant neoplasm of breast; Colon polyp; Loss of weight; Cervical spine disease; Pituitary microadenoma ([place]); History of breast cancer in adulthood; Dysmetria; Breast cancer ([place]); Bipolar disorder ([place]); Chronic pancreatitis ([place]); COPD (chronic obstructive pulmonary disease) (HCC); HTN (hypertension); OA (osteoarthritis); Renal insufficiency; Chronic pain syndrome; Chronic shoulder pain (Location of Primary Source of Pain) (Bilateral) (L>R); Osteoarthritis of shoulder (Bilateral); Chronic neck pain (Location of Secondary source of pain) (Bilateral) (L>R); History of C3-5 ACDF; Numbness and tingling in left upper extremity; Numbness and tingling of right upper extremity; Upper extremity weakness (Bilateral) (L>R); Numbness of upper extremity (Bilateral) (L>R); Numbness and tingling of lower extremity (Bilateral) (R>L); Weakness of lower extremity (Bilateral) (R>L); Chronic abdominal pain (epigastric); Cervical syndrome; Closed fracture of metatarsal bone; Chronic Lumbar central spinal stenosis; 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 Her primarily concern today is the Neck Pain  Pain Assessment: Self-Reported Pain Score: 6  (only with walking)/10 Clinically the patient looks like a 3/10 Reported level is inconsistent with clinical observations. Information on the proper use of the pain scale provided to the patient today Pain Type: Chronic pain Pain Location: Neck Pain Descriptors / Indicators: Numbness, Aching Pain Frequency: Intermittent  Elaine Clayton comes in today for a follow-up visit after her initial evaluation on 03/19/2017. Today we went over the results of her tests. These were explained in "Layman's terms". During today's appointment we went over my diagnostic impression, as well as the proposed treatment plan. In view of the cervical problems that she has been experiencing, we decided to start with a left sided cervical epidural steroid injection under fluoroscopic guidance and IV sedation. The patient will be scheduled for this as soon as possible. In addition, the patient has continued to have low back pain and bilateral lower extremity numbness and weakness, with her last lumbar MRI having been done in 2006. Even at that time, the patient already had evidence of lumbar spinal stenosis and nerve root encroachment. This has been getting worse over time. Today we will order to have this MRI repeated. Depending on the level of pathology, we may need neurosurgical consult. In addition to this, and for an unknown reason, she has been experiencing left vocal cord paralysis, hoarseness, and imbalance problems. They indicate that she has been evaluated by a neurologist who ruled out several things, including Parkinson's. They indicate that their next follow-up with the neurologist is in 1 year and they still do not have any idea what's causing some of the symptoms. A review of the literature shows the possibility of the vertigo  to be cervicogenic.  In considering  the treatment plan options, Elaine Clayton was reminded that I no longer take patients for medication management only. I asked her to let me know if she had no intention of taking advantage of the interventional therapies, so that we could make arrangements to provide this space to someone interested. I also made it clear that undergoing interventional therapies for the purpose of getting pain medications is very inappropriate on the part of a patient, and it will not be tolerated in this practice. This type of behavior would suggest true addiction and therefore it requires referral to an addiction specialist.   Further details on both, my assessment(s), as well as the proposed treatment plan, please see below. Controlled Substance Pharmacotherapy Assessment REMS (Risk Evaluation and Mitigation Strategy)  Analgesic: Hydrocodone/APAP 5/325 one to 2 tablets every 6 hours when necessary for pain (40 mg/day of hydrocodone) (last prescription written on 05/22/2016) Highest recorded MME/day: 40 mg/day MME/day: 0 mg/day Pill Count: None expected due to no prior prescriptions written by our practice. Pharmacokinetics: Liberation and absorption (onset of action): WNL Distribution (time to peak effect): WNL Metabolism and excretion (duration of action): WNL         Pharmacodynamics: Desired effects: Analgesia: Elaine Clayton reports >50% benefit. Functional ability: Patient reports that medication allows her to accomplish basic ADLs Clinically meaningful improvement in function (CMIF): Sustained CMIF goals met Perceived effectiveness: Described as relatively effective, allowing for increase in activities of daily living (ADL) Undesirable effects: Side-effects or Adverse reactions: None reported Monitoring: [place] PMP: Online review of the past 70-monthperiod previously conducted. Not applicable at this point since we have not taken over the patient's medication management  yet. List of all Serum Drug Screening Test(s):  No results found for: AMPHSCRSER, BARBSCRSER, BENZOSCRSER, COCAINSCRSER, PCPSCRSER, [place] U[place] test(s) done:  No results found for: TOXASSSELUR, SUMMARY Last UDS on record: No results found for: TOXASSSELUR, SUMMARY UDS interpretation: No unexpected findings.          Medication Assessment Form: Patient introduced to form today Treatment compliance: Treatment may start today if patient agrees with proposed plan. Evaluation of compliance is not applicable at this point Risk Assessment Profile: Aberrant behavior: See initial evaluations. None observed or detected today Comorbid factors increasing risk of overdose: See initial evaluation. No additional risks detected today Risk Mitigation Strategies:  Patient opioid safety counseling: Completed today. Counseling provided to patient as per "Patient Counseling Document". Document signed by patient, attesting to counseling and understanding Patient-Prescriber Agreement (PPA): Obtained today  Controlled substance notification to other providers: Written and sent today  Pharmacologic Plan: Today we may be taking over the patient's pharmacological regimen. See below  Laboratory Chemistry  Inflammation Markers Lab Results  Component Value Date   CRP <0.8 03/19/2017   ESRSEDRATE 4 03/19/2017   (CRP: Acute Phase) (ESR: Chronic Phase) Renal Function Markers Lab Results  Component Value Date   BUN 12 03/19/2017   CREATININE 0.99 03/19/2017   GFRAA >60 03/19/2017   GFRNONAA 57 (L) 03/19/2017   Hepatic Function Markers Lab Results  Component Value Date   AST 22 03/19/2017   ALT 14 03/19/2017   ALBUMIN 4.5 03/19/2017   ALKPHOS 53 03/19/2017   Electrolytes Lab Results  Component Value Date   NA 139 03/19/2017   K 4.0 03/19/2017   CL 107 03/19/2017   CALCIUM 9.5 03/19/2017   MG 2.1 03/19/2017   Neuropathy Markers Lab Results   Component Value Date   VITAMINB12 558 03/19/2017  Bone Pathology Markers Lab Results  Component Value Date   ALKPHOS 53 03/19/2017   25OHVITD1 57 03/19/2017   25OHVITD2 1.2 03/19/2017   25OHVITD3 56 03/19/2017   CALCIUM 9.5 03/19/2017   Coagulation Parameters Lab Results  Component Value Date   PLT 206 06/25/2015   Cardiovascular Markers Lab Results  Component Value Date   HGB 10.0 (L) 06/25/2015   HCT 31.1 (L) 06/25/2015   Note: Lab results reviewed and explained to patient in Layman's terms.  Recent Diagnostic Imaging Review  Dg Shoulder Right Result Date: 03/19/2017 CLINICAL DATA:  Initial evaluation for bilateral shoulder pain for 1 year. EXAM: RIGHT SHOULDER - 2+ VIEW COMPARISON:  None. FINDINGS: No acute fracture or dislocation. Humeral head in normal alignment with the glenoid. PC joint approximated. No significant degenerative changes about the glenohumeral joint. Mild degenerative spurring noted at the right acromioclavicular joint. No periarticular calcification. Osseous mineralization normal. No acute soft tissue abnormality. Visualized right hemithorax clear. IMPRESSION: 1. Mild degenerative osteoarthritic changes about the right acromioclavicular joint. 2. Otherwise negative radiograph of the right shoulder. No acute abnormality identified. Electronically Signed   By: Jeannine Boga M.D.   On: 03/19/2017 15:40   Dg Shoulder Left Result Date: 03/19/2017 CLINICAL DATA:  Chronic pain. EXAM: LEFT SHOULDER - 2+ VIEW COMPARISON:  Chest x-ray 11/21/2015. FINDINGS: No acute bony or joint abnormality identified. No focal abnormality. IMPRESSION: No acute abnormality. Electronically Signed   By: Marcello Moores  Register   On: 03/19/2017 15:52   Cervical Imaging: Cervical MR wo contrast:  Results for orders placed in visit on 10/13/05  MR C Spine Ltd W/O Cm   Narrative * PRIOR REPORT IMPORTED FROM AN EXTERNAL SYSTEM *   PRIOR REPORT IMPORTED FROM THE SYNGO [place] EXAM:  Cervical spinal stenosis  COMMENTS:   PROCEDURE:     MR  - MR CERVICAL SPINE WO CONT  - Oct 13 2005  6:11PM   RESULT:     Multiplanar/multisequence imaging of the cervical spine was  obtained without administration of gadolinium.  Evaluation of the cervical  cord demonstrates no T1 or T2 signal abnormalities.   A broad-based annular bulge is appreciated at the C3-4 level.  This causes  effacement of the anterior CSF space and encroachment upon the anterior  cord  as well as severe thecal sac stenosis.  There does not appear to be  evidence  of nerve root compression or compromise.   Endplate osteophytosis is demonstrated along the inferior endplate of C4  and  superior endplate of C5.  These areas cause partial to near effacement of  the anterior CSF space and mild to moderate thecal sac stenosis.  No  evidence of nerve root compression or compromise is appreciated.   At the C5-6 level, a broad-based annular bulge is appreciated  demonstrating  RIGHT lateralization.  This area of focal lateralization causes  encroachment  upon the exiting nerve root and nerve root compromise cannot be excluded.  There does not appear to be definitive nerve root compression.  Moderate  thecal sac stenosis is demonstrated at this level.  No further regions of  thecal sac stenosis, disk herniation, or nerve root compromise are  appreciated.   IMPRESSION:   Multilevel degenerative disk disease apparent at the C3-4 and C5-6 levels.   At the C3-4 level there is severe thecal sac stenosis without evidence of  nerve root compression or compromise.   At the C5-6 level moderate thecal sac stenosis  is appreciated with RIGHT  lateralization of the broad-based annular disk bulge and encroachment with compromise of the exiting nerve root.   Note not mentioned above, the thecal sac stenosis at the C3-4 level is  multifactorial and also secondary to ligamentum flavum hypertrophy.    Thank you for this opportunity to contribute to the care of your patient.       Cervical MR w/wo contrast:  Results for orders placed during the hospital encounter of 12/20/16  MR CERVICAL SPINE W WO CONTRAST   Narrative CLINICAL DATA:  Gait imbalance, dizziness for 3 years, progressively worsening. History of C3-4 fusion 2006.  EXAM: MRI CERVICAL SPINE WITHOUT AND WITH CONTRAST  TECHNIQUE: Multiplanar and multiecho pulse sequences of the cervical spine, to include the craniocervical junction and cervicothoracic junction, were obtained without and with intravenous contrast.  CONTRAST:  82m MULTIHANCE GADOBENATE DIMEGLUMINE 529 MG/ML IV SOLN  COMPARISON:  None.  FINDINGS: ALIGNMENT: Maintained cervical lordosis.  No malalignment.  VERTEBRAE/DISCS: Vertebral bodies are intact. Status post C3 through C5 ACDF with bridging bone marrow signal compatible with arthrodesis, unchanged. Moderate C5-6 disc height loss with proportional acute on chronic enhancing discogenic endplate changes. Multilevel disc desiccation. Included thoracic spine demonstrates severe T2-3 degenerative disc. No suspicious osseous or intradiscal enhancement.  CORD:Cervical spinal cord is normal morphology and signal characteristics from the cervicomedullary junction to level of T1-2, the most caudal well visualized level. No abnormal cord, leptomeningeal or epidural enhancement.  POSTERIOR FOSSA, VERTEBRAL ARTERIES, PARASPINAL TISSUES: No MR findings of ligamentous injury. Vertebral artery flow voids present. Included posterior fossa and paraspinal soft tissues are normal.  DISC LEVELS:  C2-3: Small broad-based disc bulge, uncovertebral hypertrophy. Severe RIGHT and moderate LEFT facet arthropathy. No canal stenosis. Moderate to severe RIGHT neural foraminal narrowing.  C3-4 and C4-5: ACDF. No canal stenosis or neural foraminal narrowing.  C5-6: Small broad-based disc bulge. Uncovertebral  hypertrophy. Severe RIGHT, moderate LEFT facet arthropathy. Mild canal stenosis. Moderate RIGHT, mild to moderate LEFT neural foraminal narrowing.  C6-7: No disc bulge. Uncovertebral hypertrophy and moderate to severe facet arthropathy. No canal stenosis or neural foraminal narrowing.  C7-T1: Small broad-based disc bulge. Moderate RIGHT, mild LEFT facet arthropathy without canal stenosis or neural foraminal narrowing.  IMPRESSION: Status post C3-4 and C4-5 ACDF.  Degenerative cervical spine, advanced facet arthropathy.  Mild canal stenosis C5-6. Multilevel neural foraminal narrowing: Moderate to severe on the RIGHT at C2-3.   Electronically Signed   By: CElon AlasM.D.   On: 12/21/2016 03:20    Cervical DG 2-3 views:  Results for orders placed in visit on 03/12/06  DG Cervical Spine 2 or 3 views   Narrative * PRIOR REPORT IMPORTED FROM AN EXTERNAL SYSTEM *   PRIOR REPORT IMPORTED FROM THE SYNGO WORKFLOW SYSTEM   REASON FOR EXAM:  Status post ACDF C3-4 (fax to [id number]  COMMENTS:   PROCEDURE:     DXR - DXR C- SPINE AP AND LATERAL  - Mar 12 2006  8:19AM   RESULT:     AP, lateral and odontoid views of the cervical spine were  obtained.  The vertebral body heights are well maintained.  A metallic  plate  is visualized anteriorly at the C3-C4 level consistent with anterior  fusion.   The intervertebral disk space at C3-C4 is of normal height.  There is  noted  slight narrowing of the C4-C5 cervical disk space suspicious for disk  disease at this level.  The disk space at C5-C6  appears narrowed  posteriorly  which could represent early manifestation of disk disease at this level.  The vertebral body alignment is normal.  The odontoid process is intact.   No  cervical rib formation is seen.   IMPRESSION:   Postoperative changes are noted at C3-C4.   The vertebral body alignment is normal.   There is observed narrowing of the intervertebral disk spaces at  C4-C5 and  C5-C6 suspicious for early manifestation of disk disease at these levels.   There is slight hypertrophic spurring anteriorly at C5 and C6.   Thank you for this opportunity to contribute to the care of your patient.       Shoulder Imaging: Shoulder-R DG:  Results for orders placed during the hospital encounter of 03/19/17  DG Shoulder Right   Narrative CLINICAL DATA:  Initial evaluation for bilateral shoulder pain for 1 year.  EXAM: RIGHT SHOULDER - 2+ VIEW  COMPARISON:  None.  FINDINGS: No acute fracture or dislocation. Humeral head in normal alignment with the glenoid. PC joint approximated. No significant degenerative changes about the glenohumeral joint. Mild degenerative spurring noted at the right acromioclavicular joint. No periarticular calcification. Osseous mineralization normal. No acute soft tissue abnormality. Visualized right hemithorax clear.  IMPRESSION: 1. Mild degenerative osteoarthritic changes about the right acromioclavicular joint. 2. Otherwise negative radiograph of the right shoulder. No acute abnormality identified.   Electronically Signed   By: Jeannine Boga M.D.   On: 03/19/2017 15:40    Shoulder-L DG:  Results for orders placed during the hospital encounter of 03/19/17  DG Shoulder Left   Narrative CLINICAL DATA:  Chronic pain.  EXAM: LEFT SHOULDER - 2+ VIEW  COMPARISON:  Chest x-ray 11/21/2015.  FINDINGS: No acute bony or joint abnormality identified. No focal abnormality.  IMPRESSION: No acute abnormality.   Electronically Signed   By: Marcello Moores  Register   On: 03/19/2017 15:52    Thoracic Imaging: Thoracic CT w/wo contrast:  Results for orders placed in visit on 03/03/16  CT T Spine Ltd Wo Or W/ Cm   Narrative * PRIOR REPORT IMPORTED FROM AN EXTERNAL SYSTEM *   PRIOR REPORT IMPORTED FROM THE [facility] WORKFLOW SYSTEM   REASON FOR EXAM:  Chronic pancreatitis.    Splanchnic nerve block  COMMENTS:    PROCEDURE:     CT  - CT THORACIC SPINE WO  - Sep 19 2005  9:27AM   RESULT:     A paraspinal needle is noted adjacent to the lower thoracic  spine.  The needle does not cross the pleura.  No adjacent soft tissue or  bony abnormalities are noted.   IMPRESSION:   Please see above.   Thank you for this opportunity to contribute to the care of your patient.       Lumbosacral Imaging: Lumbar MR wo contrast:  Results for orders placed in visit on 09/02/05  MR L Spine Ltd W/O Cm   Narrative * PRIOR REPORT IMPORTED FROM AN EXTERNAL SYSTEM *   PRIOR REPORT IMPORTED FROM THE [facility]GO WORKFLOW SYSTEM   REASON FOR EXAM:  L5 radiculopathy  COMMENTS:   PROCEDURE:     MR  - MR LUMBAR SPINE WO CONTRAST  - Sep 02 2005  6:00PM   RESULT:     Multiplanar/multisequence imaging of the lumbar spine was  obtained without administration of gadolinium.   The conus medullaris terminates at the L1-2 level.  The cauda equina  demonstrates no evidence of clumping or thickening.   Broad-based  annular bulge is appreciated at the L1-2 level demonstrating  lateralization to the RIGHT and LEFT.  There does not appear to be  evidence  of significant thecal sac stenosis or nerve root compression or  compromise.  There is evidence of narrowing of the neural foramina inferiorly.  A  broad-based annular bulge is appreciated at the L2-3 level with primarily  LEFT lateralization.  There is mild narrowing of the lateral recess and  neural foraminal along the inferior portion of the neural foramina on the  LEFT.  No evidence of nerve root compression or compromise is identified.  At the L3-4 level, a broad-based annular bulge is appreciated causing  partial effacement of the anterior CSF space and moderate thecal sac  stenosis.  No evidence of nerve root compression or compromise is  appreciated.  Narrowing of the lateral recess on the RIGHT and LEFT is  appreciated without evidence of nerve root compression or  compromise.  A  broad-based annular bulge is appreciated at the L4-5 level.  This  demonstrates RIGHT lateralization and narrowing of the lateral recess on  the  RIGHT. Mild to moderate thecal sac stenosis is appreciated.  There appears  to be encroachment upon the nerve root inferiorly without evidence of  definitive nerve root compression.  The thecal sac stenosis at this level  is  multifactorial and also secondary to ligamentum flavum and facet  hypertrophy.   Note at the L3-4 level, the thecal sac stenosis is also multifactorial and  also secondary to ligamentum flavum and facet hypertrophy.   This study was compared to a previous MRI of the lumbar spine dated  01/27/03.  The findings described at the L3-4 level, the area of thecal sac stenosis  has increased mild to moderately when compared to the previous study.  The  remaining findings appear to be otherwise stable.   IMPRESSION:   Multilevel disk bulges as described above which appears to be most severe  at the L3-4 level.  There is moderate thecal sac stenosis at this level which  is multifactorial.   A broad-based annular disk bulge is appreciated at the L4-5 level as  described above with RIGHT lateralization.  There is mild to moderate  thecal sac stenosis at this level and what appears to be possible encroachment upon the exiting nerve root on the RIGHT.  Definitive nerve root compression cannot be excluded. There is narrowing of the lateral recess on the RIGHT with evidence of possible compromise with the cauda equina in this area. This was not mentioned in the above.  The thecal sac stenosis at this level is also multifactorial.   Subligamentous disk bulges are appreciated at the L1-2 and L2-3 level.   Note the area of moderate thecal sac stenosis is at the L3-4 level.       Note: Results of ordered imaging test(s) reviewed and explained to patient in Layman's terms. Copy of results provided to patient  Meds  The  patient has a current medication list which includes the following prescription(s): alprazolam, aspirin ec, bisacodyl, brimonidine tartrate-timolol, bupropion, dextromethorphan, esomeprazole, estradiol-levonorgestrel, fluticasone furoate-vilanterol, ipratropium, losartan, lumigan, nicotine polacrilex, ondansetron, ranitidine, and rosuvastatin.  Current Outpatient Prescriptions on File Prior to Visit  Medication Sig  . ALPRAZolam (XANAX) 0.5 MG tablet Take 0.5 mg by mouth 2 (two) times daily as needed for sleep. 0.5-1 tablet  . aspirin EC 81 MG tablet Take 1 tablet (81 mg total) by mouth daily.  . bisacodyl (DULCOLAX) 5 MG EC tablet  Take 5 mg by mouth daily as needed for moderate constipation.  . Brimonidine Tartrate-Timolol (COMBIGAN OP) Apply 1 drop to eye 2 (two) times daily.  Marland Kitchen buPROPion (WELLBUTRIN SR) 150 MG 12 hr tablet Take 150 mg by mouth every morning. 2 tablets  . dextromethorphan 15 MG/5ML syrup Take 10 mLs by mouth 4 (four) times daily as needed for cough.  . esomeprazole (NEXIUM) 40 MG capsule Take by mouth.  [facility]) 0.045-0.015 MG/DAY Place 1 patch onto the skin once a week. Changed on Sundays  . fluticasone furoate-vilanterol (BREO ELLIPTA) 100-25 MCG/INH AEPB Inhale 1 puff into the lungs daily.  Marland Kitchen ipratropium (ATROVENT) 0.06 % nasal spray Place 2 sprays into both nostrils 4 (four) times daily as needed for rhinitis.  Marland Kitchen losartan (COZAAR) 25 MG tablet Take 25 mg by mouth daily.  Marland Kitchen LUMIGAN 0.01 % SOLN apply 1 drop to into right eye at bedtime once daily ONLY  . nicotine polacrilex (NICORETTE) 2 MG gum Take 2 mg by mouth as needed for smoking cessation.  . ondansetron (ZOFRAN) 8 MG tablet Take by mouth every 8 (eight) hours as needed for nausea or vomiting.  . ranitidine (ZANTAC) 150 MG tablet Take 150 mg by mouth daily.  . rosuvastatin (CRESTOR) 10 MG tablet Take 10 mg by mouth at bedtime.    No current facility-administered medications on file prior  to visit.    ROS  Constitutional: Denies any fever or chills Gastrointestinal: No reported hemesis, hematochezia, vomiting, or acute GI distress Musculoskeletal: Denies any acute onset joint swelling, redness, loss of ROM, or weakness Neurological: No reported episodes of acute onset apraxia, aphasia, dysarthria, agnosia, amnesia, paralysis, loss of coordination, or loss of consciousness  Allergies  Elaine Clayton is allergic to influenza vaccines; ciprofloxacin; flu virus vaccine; sulfa antibiotics; and tetracyclines & related.  [place]  Drug: Elaine Clayton  reports that she does not use drugs. Alcohol:  reports that she drinks alcohol. Tobacco:  reports that she quit smoking about 9 years ago. She has a 35.00 pack-year smoking history. She has never used smokeless tobacco. Medical:  has a past medical history of Anemia; Bipolar affective disorder ([place]); Broken foot (2015); Chronic gastritis; Chronic kidney disease; COPD (chronic obstructive pulmonary disease) ([place]); Dizziness; Dysphagia; Glaucoma; Hyperlipemia; Hypertension; Lithium toxicity (2015); Malignant neoplasm of upper-outer quadrant of female breast [facility]) (May 07, 2007); Neck stiffness; Osteoarthritis; Pancreatitis; Personal history of malignant neoplasm of breast; Pituitary microadenoma ([place]); Recurrent UTI; Renal insufficiency; and Stomach ulcer (2112). Family: family history includes Breast cancer (age of onset: 95) in her mother.  Past Surgical History:  Procedure Laterality Date  . APPENDECTOMY    . BREAST BIOPSY Right 2011   neg  . BREAST EXCISIONAL BIOPSY Right 2001   neg  . BREAST EXCISIONAL BIOPSY Right 2008   breast ca 2008 radiation  . BREAST EXCISIONAL BIOPSY Left 10/26/2012   neg  . BREAST SURGERY Left  2013   left breast wide excision,Intraductal papilloma, ductal hyperplasia and sclerosing adenosis. Microcalcifications associated with columnar cell change. No evidence of atypia or malignancy. Margins are unremarkable.   Marland Kitchen BREAST SURGERY Right November 06, 1999   multiple areas of microcalcification showing evidence of sclerosing adenosis and ductal hyperplasia.  Marland Kitchen BREAST SURGERY Left May 07, 2007   wide excision.  . cataract Right 2013  . CATARACT EXTRACTION W/PHACO Left 03/05/2016   Procedure: CATARACT EXTRACTION PHACO AND INTRAOCULAR LENS PLACEMENT (IOC);  Surgeon: Leandrew Koyanagi, MD;  Location: [place];  Service: Ophthalmology;  Laterality: Left;  . COLONOSCOPY  2004  . COLONOSCOPY N/A 05/14/2015   Procedure: COLONOSCOPY;  Surgeon: Manya Silvas, MD;  Location: [facility] ENDOSCOPY;  Service: Endoscopy;  Laterality: N/A;  . ERCP N/A   . ESOPHAGOGASTRODUODENOSCOPY N/A 05/14/2015   Procedure: ESOPHAGOGASTRODUODENOSCOPY (EGD);  Surgeon: Manya Silvas, MD;  Location: [facility] ENDOSCOPY;  Service: Endoscopy;  Laterality: N/A;  . EUS N/A 2011, 2012  . EYE SURGERY  2013  . GLAUCOMA SURGERY Right 2013  . LAPAROSCOPIC RIGHT COLECTOMY Right 06/22/2015   Procedure: LAPAROSCOPIC RIGHT COLECTOMY;  Surgeon: Robert Bellow, MD;  Location: [facility] ORS;  Service: General;  Laterality: Right;  . NECK SURGERY  2006  . right breast cancer    . SAVORY DILATION N/A 05/14/2015   Procedure: SAVORY DILATION;  Surgeon: Manya Silvas, MD;  Location: [facility] ENDOSCOPY;  Service: Endoscopy;  Laterality: N/A;  . TRABECULECTOMY Left 03/05/2016   Procedure: TRABECULECTOMY WITH [facility] AND [facility];  Surgeon: Leandrew Koyanagi, MD;  Location: [place];  Service: Ophthalmology;  Laterality: Left;  . TUBAL LIGATION     Constitutional Exam  General appearance: Well nourished, well developed, and well hydrated. In no apparent acute distress Vitals:   05/06/17 1345  BP: 124/62  Pulse: 75  Resp: 18  Temp: 97.9 F (36.6 C)  TempSrc: Oral  SpO2: 100%  Weight: 100 lb (45.4 kg)  Height: 5' 4"  (1.626 m)   BMI Assessment: Estimated body mass index is 17.16 kg/m as calculated from the following:   Height as of  this encounter: 5' 4"  (1.626 m).   Weight as of this encounter: 100 lb (45.4 kg).  BMI interpretation table: BMI level Category Range association with higher incidence of chronic pain  <18 kg/m2 Underweight   18.5-24.9 kg/m2 Ideal body weight   25-29.9 kg/m2 Overweight Increased incidence by 20%  30-34.9 kg/m2 Obese (Class I) Increased incidence by 68%  35-39.9 kg/m2 Severe obesity (Class II) Increased incidence by 136%  >40 kg/m2 Extreme obesity (Class III) Increased incidence by 254%   BMI Readings from Last 4 Encounters:  05/06/17 17.16 kg/m  03/19/17 17.16 kg/m  02/09/17 17.68 kg/m  12/08/16 17.16 kg/m   Wt Readings from Last 4 Encounters:  05/06/17 100 lb (45.4 kg)  03/19/17 100 lb (45.4 kg)  02/09/17 103 lb (46.7 kg)  12/08/16 100 lb (45.4 kg)  Psych/Mental status: Alert, oriented x 3 (person, place, & time)       Eyes: PERLA Respiratory: No evidence of acute respiratory distress  Cervical Spine Exam  Inspection: No masses, redness, or swelling Alignment: Symmetrical Functional ROM: Decreased ROM      Stability: No instability detected Muscle strength & Tone: Functionally intact Sensory: Movement-associated discomfort Palpation: No palpable anomalies              Upper Extremity (UE) Exam    Side: Right upper extremity  Side: Left upper extremity  Inspection: No masses, redness, swelling, or asymmetry. No contractures  Inspection: No masses, redness, swelling, or asymmetry. No contractures  Functional ROM: Unrestricted ROM          Functional ROM: Unrestricted ROM          Muscle strength & Tone: Functionally intact  Muscle strength & Tone: Functionally intact  Sensory: Unimpaired  Sensory: Unimpaired  Palpation: No palpable anomalies              Palpation: No palpable anomalies  Specialized Test(s): Deferred         Specialized Test(s): Deferred          Thoracic Spine Exam  Inspection: No masses, redness, or swelling Alignment:  Symmetrical Functional ROM: Unrestricted ROM Stability: No instability detected Sensory: Unimpaired Muscle strength & Tone: No palpable anomalies  Lumbar Spine Exam  Inspection: No masses, redness, or swelling Alignment: Symmetrical Functional ROM: Decreased ROM      Stability: No instability detected Muscle strength & Tone: Functionally intact Sensory: Movement-associated pain Palpation: Complains of area being tender to palpation       Provocative Tests: Lumbar Hyperextension and rotation test: evaluation deferred today       Patrick's Maneuver: evaluation deferred today                    Gait & Posture Assessment  Ambulation: Limited Gait: Antalgic Posture: WNL   Lower Extremity Exam    Side: Right lower extremity  Side: Left lower extremity  Inspection: No masses, redness, swelling, or asymmetry. No contractures  Inspection: No masses, redness, swelling, or asymmetry. No contractures  Functional ROM: Unrestricted ROM          Functional ROM: Unrestricted ROM          Muscle strength & Tone: Functionally intact  Muscle strength & Tone: Functionally intact  Sensory: Unimpaired  Sensory: Unimpaired  Palpation: No palpable anomalies  Palpation: No palpable anomalies   Assessment & Plan  Primary Diagnosis & Pertinent Problem List: The primary encounter diagnosis was Cervical radiculitis. 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  Visit Diagnosis: 1. Cervical radiculitis   2. 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 Numbness and tingling in left upper extremity   4. Upper extremity weakness (Bilateral) (L>R)   5. 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HCC)   6. Abnormal MRI, cervical spine   7. Chronic neck pain (Location of Secondary source of pain) (Bilateral) (L>R)   8. Chronic shoulder pain (Location of Primary Source of Pain) (Bilateral) (L>R)   9. Chronic lumbar radiculopathy (Bilateral)   10. Chronic low back pain (Bilateral) (L>R)   11. Numbness and tingling of lower extremity (Bilateral) (R>L)   12. Spinal stenosis, lumbar region, with neurogenic claudication   13. 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 Severe RIGHT and moderate LEFT facet arthropathy. Moderate to severe RIGHT neural foraminal narrowing. C3-4 and C4-5: ACDF. No canal stenosis or neural foraminal narrowing. C5-6: Small broad-based disc bulge. Uncovertebral hypertrophy. Severe RIGHT, moderate LEFT facet arthropathy. Mild canal stenosis. Moderate RIGHT, mild to moderate LEFT neural foraminal narrowing. C6-7: Uncovertebral hypertrophy and moderate to severe facet arthropathy. C7-T1: Small broad-based disc bulge. Moderate RIGHT, mild LEFT facet arthropathy without canal stenosis or neural foraminal narrowing.   Cervical Spondylitis With Radiculitis (Hcc)  Cervical  Radiculitis  Chronic lumbar radiculopathy (Bilateral)  Chronic low back pain (Bilateral) (L>R)  Abnormal Mri, Lumbar Spine   IMPRESSION: Multilevel disk bulges which appears to be most severe at the L3-4 level. There is moderate thecal sac stenosis at this level which is  multifactorial.  A broad-based annular disk bulge is appreciated at the L4-5 level as described above with RIGHT lateralization. There is mild to moderate thecal sac stenosis at this level and what appears to be possible encroachment upon the exiting nerve root on the RIGHT.  Definitive nerve root compression cannot be excluded. There is narrowing of the lateral recess on the RIGHT with evidence of possible compromise with the cauda equina in this area. The thecal sac stenosis at this level is also multifactorial.  Subligamentous disk bulges are appreciated at the L1-2 and L2-3 level.   Note the area of moderate thecal sac stenosis is at the L3-4 level.    Problem-specific Plan(s): No problem-specific Assessment & Plan notes found for this encounter.  Assessment & plan notes cannot be loaded without a specified hospital service.  Plan of Care  Pharmacotherapy (Medications Ordered): No orders of the defined types were placed in this encounter.  Lab-work, procedure(s), and/or referral(s): Orders Placed This Encounter  Procedures  . Cervical Epidural Injection  . MR LUMBAR SPINE WO CONTRAST    Pharmacotherapy: Opioid Analgesics: We'll take over management today. See above orders Membrane stabilizer: We have discussed the possibility of optimizing this mode of therapy, if tolerated Muscle relaxant: We have discussed the possibility of a trial NSAID: We have discussed the possibility of a trial Other analgesic(s): To be determined at a later time   Interventional therapies: Planned, scheduled, and/or pending:    Diagnostic Left CESI under fluoroscopy or IV sedation   Considering:   Diagnostic cervical epidural steroid injection under fluoroscopic guidance  Diagnostic lumbar epidural steroid injection    PRN Procedures:   To be determined at a later time   Provider-requested follow-up: Return for procedure (w/ sedation):, (ASAP), by MD.  Future Appointments Date Time Provider  [place]  05/11/2017 8:45 AM Milinda Pointer, MD [facility] None  05/20/2017 9:00 AM [facility]  07/02/2017 12:20 PM [facility] [place]  07/20/2017 1:30 PM Byrnett, Forest Gleason, MD [facility] None  02/10/2018 1:30 PM Dohmeier, Asencion Partridge, MD [facility] None    Primary Care Physician: Idelle Crouch, MD Location: [facility] Note by: Kathlen Brunswick Dossie Arbour, M.D, DABA, DABAPM, DABPM, DABIPP, FIPP Date: 05/06/2017; Time: 6:20 PM  Patient instructions provided during this appointment: Patient Instructions  _______________________________________________________________  Preparing for Procedure with Sedation Instructions: . Oral Intake: Do not eat or drink anything for at least 8 hours prior to your procedure. . Transportation: Public transportation is not allowed. Bring an adult driver. The driver must be physically present in our waiting room before any procedure can be started. Marland Kitchen Physical Assistance: Bring an adult physically capable of assisting you, in the event you need help. This adult should keep you company at home for at least 6 hours after the procedure. . Blood Pressure Medicine: Take your blood pressure medicine with a sip of water the morning of the procedure. . Blood thinners:  . Diabetics on insulin: Notify the staff so that you can be scheduled 1st case in the morning. If your diabetes requires high dose insulin, take only  of your normal insulin dose the morning of the procedure and notify the staff that you have done so. Marland Kitchen  Preventing infections: Shower with an antibacterial soap the morning of your procedure. . Build-up your immune system: Take 1000 mg of Vitamin C with every meal (3 times a day) the day prior to your procedure. Marland Kitchen Antibiotics: Inform the staff if you have a condition or reason that requires you to take antibiotics before dental procedures. . Pregnancy: If you are pregnant, call and cancel the procedure. . Sickness:  If you have a cold, fever, or any active infections, call and cancel the procedure. . Arrival: You must be in the facility at least 30 minutes prior to your scheduled procedure. . Children: Do not bring children with you. . Dress appropriately: Bring dark clothing that you would not mind if they get stained. . Valuables: Do not bring any jewelry or valuables. Procedure appointments are reserved for interventional treatments only. Marland Kitchen No Prescription Refills. . No medication changes will be discussed during procedure appointments. . No disability issues will be discussed. ______________________________________________________________________________________________

## 2017-05-10 ENCOUNTER — Other Ambulatory Visit: Payer: Self-pay | Admitting: Obstetrics & Gynecology

## 2017-05-11 ENCOUNTER — Ambulatory Visit (HOSPITAL_BASED_OUTPATIENT_CLINIC_OR_DEPARTMENT_OTHER): Payer: Medicare Other | Admitting: Pain Medicine

## 2017-05-11 ENCOUNTER — Ambulatory Visit
Admission: RE | Admit: 2017-05-11 | Discharge: 2017-05-11 | Disposition: A | Discharge status: Home / Self Care | Payer: Medicare Other | Source: Ambulatory Visit | Attending: Pain Medicine | Admitting: Pain Medicine

## 2017-05-11 ENCOUNTER — Encounter: Payer: Self-pay | Admitting: Pain Medicine

## 2017-05-11 VITALS — BP 115/84 | HR 68 | Temp 97.4°F | Resp 13 | Ht 64.0 in | Wt 101.0 lb

## 2017-05-11 DIAGNOSIS — M4682 Other specified inflammatory spondylopathies, cervical region: Secondary | ICD-10-CM

## 2017-05-11 DIAGNOSIS — M5412 Radiculopathy, cervical region: Secondary | ICD-10-CM

## 2017-05-11 DIAGNOSIS — R200 Anesthesia of skin: Secondary | ICD-10-CM | POA: Diagnosis not present

## 2017-05-11 DIAGNOSIS — M542 Cervicalgia: Secondary | ICD-10-CM

## 2017-05-11 DIAGNOSIS — G8929 Other chronic pain: Secondary | ICD-10-CM

## 2017-05-11 DIAGNOSIS — M4722 Other spondylosis with radiculopathy, cervical region: Secondary | ICD-10-CM | POA: Insufficient documentation

## 2017-05-11 DIAGNOSIS — M25511 Pain in right shoulder: Secondary | ICD-10-CM

## 2017-05-11 DIAGNOSIS — Z981 Arthrodesis status: Secondary | ICD-10-CM

## 2017-05-11 DIAGNOSIS — R202 Paresthesia of skin: Secondary | ICD-10-CM | POA: Diagnosis not present

## 2017-05-11 DIAGNOSIS — M4802 Spinal stenosis, cervical region: Secondary | ICD-10-CM

## 2017-05-11 DIAGNOSIS — M4692 Unspecified inflammatory spondylopathy, cervical region: Secondary | ICD-10-CM

## 2017-05-11 DIAGNOSIS — M25512 Pain in left shoulder: Secondary | ICD-10-CM

## 2017-05-11 MED ORDER — IOPAMIDOL (ISOVUE-M 200) INJECTION 41%
10.0000 mL | Freq: Once | INTRAMUSCULAR | Status: AC
  Administered 2017-05-11: 10 mL via EPIDURAL
  Filled 2017-05-11: qty 10

## 2017-05-11 MED ORDER — SODIUM CHLORIDE 0.9% FLUSH
1.0000 mL | Freq: Once | INTRAVENOUS | Status: AC
  Administered 2017-05-11: 1 mL

## 2017-05-11 MED ORDER — LIDOCAINE HCL (PF) 1 % IJ SOLN
10.0000 mL | Freq: Once | INTRAMUSCULAR | Status: AC
  Administered 2017-05-11: 10 mL
  Filled 2017-05-11: qty 10

## 2017-05-11 MED ORDER — LACTATED RINGERS IV SOLN
1000.0000 mL | Freq: Once | INTRAVENOUS | Status: AC
  Administered 2017-05-11: 1000 mL via INTRAVENOUS

## 2017-05-11 MED ORDER — MIDAZOLAM HCL 5 MG/5ML IJ SOLN
1.0000 mg | INTRAMUSCULAR | Status: DC | PRN
  Administered 2017-05-11: 1 mg via INTRAVENOUS
  Filled 2017-05-11: qty 5

## 2017-05-11 MED ORDER — FENTANYL CITRATE (PF) 100 MCG/2ML IJ SOLN
25.0000 ug | INTRAMUSCULAR | Status: DC | PRN
  Administered 2017-05-11: 50 ug via INTRAVENOUS
  Filled 2017-05-11: qty 2

## 2017-05-11 MED ORDER — DEXAMETHASONE SODIUM PHOSPHATE 10 MG/ML IJ SOLN
10.0000 mg | Freq: Once | INTRAMUSCULAR | Status: AC
  Administered 2017-05-11: 10 mg
  Filled 2017-05-11: qty 1

## 2017-05-11 MED ORDER — ROPIVACAINE HCL 2 MG/ML IJ SOLN
1.0000 mL | Freq: Once | INTRAMUSCULAR | Status: AC
  Administered 2017-05-11: 1 mL via EPIDURAL
  Filled 2017-05-11: qty 10

## 2017-05-11 NOTE — Patient Instructions (Addendum)
Post-Procedure instructions Instructions:  Apply ice: Fill a plastic sandwich bag with crushed ice. Cover it with a small towel and apply to injection site. Apply for 15 minutes then remove x 15 minutes. Repeat sequence on day of procedure, until you go to bed. The purpose is to minimize swelling and discomfort after procedure.  Apply heat: Apply heat to procedure site starting the day following the procedure. The purpose is to treat any soreness and discomfort from the procedure.  Food intake: Start with clear liquids (like water) and advance to regular food, as tolerated.   Physical activities: Keep activities to a minimum for the first 8 hours after the procedure.   Driving: If you have received any sedation, you are not allowed to drive for 24 hours after your procedure.  Blood thinner: Restart your blood thinner 6 hours after your procedure. (Only for those taking blood thinners)  Insulin: As soon as you can eat, you may resume your normal dosing schedule. (Only for those taking insulin)  Infection prevention: Keep procedure site clean and dry.  Post-procedure Pain Diary: Extremely important that this be done correctly and accurately. Recorded information will be used to determine the next step in treatment.  Pain evaluated is that of treated area only. Do not include pain from an untreated area.  Complete every hour, on the hour, for the initial 8 hours. Set an alarm to help you do this part accurately.  Do not go to sleep and have it completed later. It will not be accurate.  Follow-up appointment: Keep your follow-up appointment after the procedure. Usually 2 weeks for most procedures. (6 weeks in the case of radiofrequency.) Bring you pain diary.  Expect:  From numbing medicine (AKA: Local Anesthetics): Numbness or decrease in pain.  Onset: Full effect within 15 minutes of injected.  Duration: It will depend on the type of local anesthetic used. On the average, 1 to 8  hours.   From steroids: Decrease in swelling or inflammation. Once inflammation is improved, relief of the pain will follow.  Onset of benefits: Depends on the amount of swelling present. The more swelling, the longer it will take for the benefits to be seen.   Duration: Steroids will stay in the system x 2 weeks. Duration of benefits will depend on multiple posibilities including persistent irritating factors.  From procedure: Some discomfort is to be expected once the numbing medicine wears off. This should be minimal if ice and heat are applied as instructed. Call if:  You experience numbness and weakness that gets worse with time, as opposed to wearing off.  New onset bowel or bladder incontinence. (Spinal procedures only)  Emergency Numbers:  Durning business hours (Monday - Thursday, 8:00 AM - 4:00 PM) (Friday, 9:00 AM - 12:00 Noon): [phone]  After hours: [phone] _____________________________________________________________________________________________  Pain Management Discharge Instructions  General Discharge Instructions :  If you need to reach your doctor call: Monday-Friday 8:00 am - 4:00 pm at [phone] or toll free [phone].  After clinic hours [phone] to have operator reach doctor.  Bring all of your medication bottles to all your appointments in the pain clinic.  To cancel or reschedule your appointment with Pain Management please remember to call 24 hours in advance to avoid a fee.  Refer to the educational materials which you have been given on: General Risks, I had my Procedure. Discharge Instructions, Post Sedation.  Post Procedure Instructions:  The drugs you were given will stay in your system until  tomorrow, so for the next 24 hours you should not drive, make any legal decisions or drink any alcoholic beverages.  You may eat anything you prefer, but it is better to start with liquids then soups and crackers, and gradually work  up to solid foods.  Please notify your doctor immediately if you have any unusual bleeding, trouble breathing or pain that is not related to your normal pain.  Depending on the type of procedure that was done, some parts of your body may feel week and/or numb.  This usually clears up by tonight or the next day.  Walk with the use of an assistive device or accompanied by an adult for the 24 hours.  You may use ice on the affected area for the first 24 hours.  Put ice in a Ziploc bag and cover with a towel and place against area 15 minutes on 15 minutes off.  You may switch to heat after 24 hours.GENERAL RISKS AND COMPLICATIONS  What are the risk, side effects and possible complications? Generally speaking, most procedures are safe.  However, with any procedure there are risks, side effects, and the possibility of complications.  The risks and complications are dependent upon the sites that are lesioned, or the type of nerve block to be performed.  The closer the procedure is to the spine, the more serious the risks are.  Great care is taken when placing the radio frequency needles, block needles or lesioning probes, but sometimes complications can occur. 1. Infection: Any time there is an injection through the skin, there is a risk of infection.  This is why sterile conditions are used for these blocks.  There are four possible types of infection. 1. Localized skin infection. 2. Central Nervous System Infection-This can be in the form of Meningitis, which can be deadly. 3. Epidural Infections-This can be in the form of an epidural abscess, which can cause pressure inside of the spine, causing compression of the spinal cord with subsequent paralysis. This would require an emergency surgery to decompress, and there are no guarantees that the patient would recover from the paralysis. 4. Discitis-This is an infection of the intervertebral discs.  It occurs in about 1% of discography procedures.  It is  difficult to treat and it may lead to surgery.        2. Pain: the needles have to go through skin and soft tissues, will cause soreness.       3. Damage to internal structures:  The nerves to be lesioned may be near blood vessels or    other nerves which can be potentially damaged.       4. Bleeding: Bleeding is more common if the patient is taking blood thinners such as  aspirin, Coumadin, Ticiid, Plavix, etc., or if he/she have some genetic predisposition  such as hemophilia. Bleeding into the spinal canal can cause compression of the spinal  cord with subsequent paralysis.  This would require an emergency surgery to  decompress and there are no guarantees that the patient would recover from the  paralysis.       5. Pneumothorax:  Puncturing of a lung is a possibility, every time a needle is introduced in  the area of the chest or upper back.  Pneumothorax refers to free air around the  collapsed lung(s), inside of the thoracic cavity (chest cavity).  Another two possible  complications related to a similar event would include: Hemothorax and Chylothorax.   These are variations of the  Pneumothorax, where instead of air around the collapsed  lung(s), you may have blood or chyle, respectively.       6. Spinal headaches: They may occur with any procedures in the area of the spine.       7. Persistent CSF (Cerebro-Spinal Fluid) leakage: This is a rare problem, but may occur  with prolonged intrathecal or epidural catheters either due to the formation of a fistulous  track or a dural tear.       8. Nerve damage: By working so close to the spinal cord, there is always a possibility of  nerve damage, which could be as serious as a permanent spinal cord injury with  paralysis.       9. Death:  Although rare, severe deadly allergic reactions known as "Anaphylactic  reaction" can occur to any of the medications used.      10. Worsening of the symptoms:  We can always make thing worse.  What are the chances of  something like this happening? Chances of any of this occuring are extremely low.  By statistics, you have more of a chance of getting killed in a motor vehicle accident: while driving to the hospital than any of the above occurring .  Nevertheless, you should be aware that they are possibilities.  In general, it is similar to taking a shower.  Everybody knows that you can slip, hit your head and get killed.  Does that mean that you should not shower again?  Nevertheless always keep in mind that statistics do not mean anything if you happen to be on the wrong side of them.  Even if a procedure has a 1 (one) in a 1,000,000 (million) chance of going wrong, it you happen to be that one..Also, keep in mind that by statistics, you have more of a chance of having something go wrong when taking medications.  Who should not have this procedure? If you are on a blood thinning medication (e.g. Coumadin, Plavix, see list of "Blood Thinners"), or if you have an active infection going on, you should not have the procedure.  If you are taking any blood thinners, please inform your physician.  How should I prepare for this procedure?  Do not eat or drink anything at least six hours prior to the procedure.  Bring a driver with you .  It cannot be a taxi.  Come accompanied by an adult that can drive you back, and that is strong enough to help you if your legs get weak or numb from the local anesthetic.  Take all of your medicines the morning of the procedure with just enough water to swallow them.  If you have diabetes, make sure that you are scheduled to have your procedure done first thing in the morning, whenever possible.  If you have diabetes, take only half of your insulin dose and notify our nurse that you have done so as soon as you arrive at the clinic.  If you are diabetic, but only take blood sugar pills (oral hypoglycemic), then do not take them on the morning of your procedure.  You may take them  after you have had the procedure.  Do not take aspirin or any aspirin-containing medications, at least eleven (11) days prior to the procedure.  They may prolong bleeding.  Wear loose fitting clothing that may be easy to take off and that you would not mind if it got stained with Betadine or blood.  Do not wear any jewelry  or perfume  Remove any nail coloring.  It will interfere with some of our monitoring equipment.  NOTE: Remember that this is not meant to be interpreted as a complete list of all possible complications.  Unforeseen problems may occur.  BLOOD THINNERS The following drugs contain aspirin or other products, which can cause increased bleeding during surgery and should not be taken for 2 weeks prior to and 1 week after surgery.  If you should need take something for relief of minor pain, you may take acetaminophen which is found in Tylenol,m Datril, Anacin-3 and Panadol. It is not blood thinner. The products listed below are.  Do not take any of the products listed below in addition to any listed on your instruction sheet.  A.P.C or A.P.C with Codeine Codeine Phosphate Capsules #3 Ibuprofen Ridaura  ABC compound Congesprin Imuran rimadil  Advil Cope Indocin Robaxisal  Alka-Seltzer Effervescent Pain Reliever and Antacid Coricidin or Coricidin-D  Indomethacin Rufen  Alka-Seltzer plus Cold Medicine Cosprin Ketoprofen S-A-C Tablets  Anacin Analgesic Tablets or Capsules Coumadin Korlgesic Salflex  Anacin Extra Strength Analgesic tablets or capsules CP-2 Tablets Lanoril Salicylate  Anaprox Cuprimine Capsules Levenox Salocol  Anexsia-D Dalteparin Magan Salsalate  Anodynos Darvon compound Magnesium Salicylate Sine-off  Ansaid Dasin Capsules Magsal Sodium Salicylate  Anturane Depen Capsules Marnal Soma  APF Arthritis pain formula Dewitt's Pills Measurin Stanback  Argesic Dia-Gesic Meclofenamic Sulfinpyrazone  Arthritis Bayer Timed Release Aspirin Diclofenac Meclomen Sulindac   Arthritis pain formula Anacin Dicumarol Medipren Supac  Analgesic (Safety coated) Arthralgen Diffunasal Mefanamic Suprofen  Arthritis Strength Bufferin Dihydrocodeine Mepro Compound Suprol  Arthropan liquid Dopirydamole Methcarbomol with Aspirin Synalgos  ASA tablets/Enseals Disalcid Micrainin Tagament  Ascriptin Doan's Midol Talwin  Ascriptin A/D Dolene Mobidin Tanderil  Ascriptin Extra Strength Dolobid Moblgesic Ticlid  Ascriptin with Codeine Doloprin or Doloprin with Codeine Momentum Tolectin  Asperbuf Duoprin Mono-gesic Trendar  Aspergum Duradyne Motrin or Motrin IB Triminicin  Aspirin plain, buffered or enteric coated Durasal Myochrisine Trigesic  Aspirin Suppositories Easprin Nalfon Trillsate  Aspirin with Codeine Ecotrin Regular or Extra Strength Naprosyn Uracel  Atromid-S Efficin Naproxen Ursinus  Auranofin Capsules Elmiron Neocylate Vanquish  Axotal Emagrin Norgesic Verin  Azathioprine Empirin or Empirin with Codeine Normiflo Vitamin E  Azolid Emprazil Nuprin Voltaren  Bayer Aspirin plain, buffered or children's or timed BC Tablets or powders Encaprin Orgaran Warfarin Sodium  Buff-a-Comp Enoxaparin Orudis Zorpin  Buff-a-Comp with Codeine Equegesic Os-Cal-Gesic   Buffaprin Excedrin plain, buffered or Extra Strength Oxalid   Bufferin Arthritis Strength Feldene Oxphenbutazone   Bufferin plain or Extra Strength Feldene Capsules Oxycodone with Aspirin   Bufferin with Codeine Fenoprofen Fenoprofen Pabalate or Pabalate-SF   Buffets II Flogesic Panagesic   Buffinol plain or Extra Strength Florinal or Florinal with Codeine Panwarfarin   Buf-Tabs Flurbiprofen Penicillamine   Butalbital Compound Four-way cold tablets Penicillin   Butazolidin Fragmin Pepto-Bismol   Carbenicillin Geminisyn Percodan   Carna Arthritis Reliever Geopen Persantine   Carprofen Gold's salt Persistin   Chloramphenicol Goody's Phenylbutazone   Chloromycetin Haltrain Piroxlcam   Clmetidine heparin Plaquenil    Cllnoril Hyco-pap Ponstel   Clofibrate Hydroxy chloroquine Propoxyphen         Before stopping any of these medications, be sure to consult the physician who ordered them.  Some, such as Coumadin (Warfarin) are ordered to prevent or treat serious conditions such as "deep thrombosis", "pumonary embolisms", and other heart problems.  The amount of time that you may need off of the medication may also vary with  the medication and the reason for which you were taking it.  If you are taking any of these medications, please make sure you notify your pain physician before you undergo any procedures.          Epidural Steroid Injection An epidural steroid injection is a shot of steroid medicine and numbing medicine that is given into the space between the spinal cord and the bones in your back (epidural space). The shot helps relieve pain caused by an irritated or swollen nerve root. The amount of pain relief you get from the injection depends on what is causing the nerve to be swollen and irritated, and how long your pain lasts. You are more likely to benefit from this injection if your pain is strong and comes on suddenly rather than if you have had pain for a long time. Tell a health care provider about:  Any allergies you have.  All medicines you are taking, including vitamins, herbs, eye drops, creams, and over-the-counter medicines.  Any problems you or family members have had with anesthetic medicines.  Any blood disorders you have.  Any surgeries you have had.  Any medical conditions you have.  Whether you are pregnant or may be pregnant. What are the risks? Generally, this is a safe procedure. However, problems may occur, including:  Headache.  Bleeding.  Infection.  Allergic reaction to medicines.  Damage to your nerves. What happens before the procedure? Staying hydrated  Follow instructions from your health care provider about hydration, which may include:  Up to  2 hours before the procedure - you may continue to drink clear liquids, such as water, clear fruit juice, black coffee, and plain tea. Eating and drinking restrictions  Follow instructions from your health care provider about eating and drinking, which may include:  8 hours before the procedure - stop eating heavy meals or foods such as meat, fried foods, or fatty foods.  6 hours before the procedure - stop eating light meals or foods, such as toast or cereal.  6 hours before the procedure - stop drinking milk or drinks that contain milk.  2 hours before the procedure - stop drinking clear liquids. Medicine   You may be given medicines to lower anxiety.  Ask your health care provider about:  Changing or stopping your regular medicines. This is especially important if you are taking diabetes medicines or blood thinners.  Taking medicines such as aspirin and ibuprofen. These medicines can thin your blood. Do not take these medicines before your procedure if your health care provider instructs you not to. General instructions   Plan to have someone take you home from the hospital or clinic. What happens during the procedure?  You may receive a medicine to help you relax (sedative).  You will be asked to lie on your abdomen.  The injection site will be cleaned.  A numbing medicine (local anesthetic) will be used to numb the injection site.  A needle will be inserted through your skin into the epidural space. You may feel some discomfort when this happens. An X-ray machine will be used to make sure the needle is put as close as possible to the affected nerve.  A steroid medicine and a local anesthetic will be injected into the epidural space.  The needle will be removed.  A bandage (dressing) will be put over the injection site. What happens after the procedure?  Your blood pressure, heart rate, breathing rate, and blood oxygen level will be monitored  until the medicines you were  given have worn off.  Your arm or leg may feel weak or numb for a few hours.  The injection site may feel sore.  Do not drive for 24 hours if you received a sedative. This information is not intended to replace advice given to you by your health care provider. Make sure you discuss any questions you have with your health care provider. Document Released: 03/16/2008 Document Revised: 05/21/2016 Document Reviewed: 03/25/2016 [employer]vier Interactive Patient Education  2017 [employer].

## 2017-05-11 NOTE — Progress Notes (Signed)
Safety precautions to be maintained throughout the outpatient stay will include: orient to surroundings, keep bed in low position, maintain call bell within reach at all times, provide assistance with transfer out of bed and ambulation.  

## 2017-05-11 NOTE — Progress Notes (Signed)
Patient's Name: Elaine Clayton  MRN: [id number]  Referring Provider: Idelle Crouch, MD  DOB: 10/26/1946  PCP: Idelle Crouch, MD  DOS: 05/11/2017  Note by: Kathlen Brunswick. Dossie Arbour, MD  Service setting: Ambulatory outpatient  Location: [facility] (AMB) Pain Management Facility  Visit type: Procedure  Specialty: Interventional Pain Management  Patient type: Established   Primary Reason for Visit: Interventional Pain Management Treatment. CC: Neck Pain (across both shoulders worse on the left today)  Procedure:  Anesthesia, Analgesia, Anxiolysis:  Type: Diagnostic, Inter-Laminar, Epidural Steroid Injection Region: Posterior Cervico-thoracic Region Level: C7-T1 Laterality: Left-Sided Paramedial  Type: Local Anesthesia with Moderate (Conscious) Sedation Local Anesthetic: Lidocaine 1% Route: Intravenous (IV) IV Access: Secured Sedation: Meaningful verbal contact was maintained at all times during the procedure  Indication(s): Analgesia and Anxiety  Indications: 1. Cervical radiculitis   2. 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HCC)   3. 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 Chronic neck pain (Location of Secondary source of pain) (Bilateral) (L>R)   5. History of C3-5 ACDF   6. Chronic shoulder pain (Location of Primary Source of Pain) (Bilateral) (L>R)   7. Numbness and tingling in left upper extremity   8. Numbness and tingling of right upper extremity    Pain Score: Pre-procedure: 4 /10 Post-procedure: 0-No pain/10  Pre-op Assessment:  Previous date of service: 05/06/17 Service provided:   Elaine Clayton is a 71 y.o. (year old), female patient, seen today for interventional treatment. She  has a past surgical history that includes Colonoscopy (2004); Neck surgery (2006); Eye surgery (2013); Appendectomy; Tubal ligation; Colonoscopy (N/A, 05/14/2015); Esophagogastroduodenoscopy (N/A, 05/14/2015); Savory dilation (N/A, 05/14/2015); ERCP (N/A); Glaucoma surgery (Right, 2013); cataract  (Right, 2013); Breast surgery (Left,  2013); Breast surgery (Right, November 06, 1999); Breast surgery (Left, May 07, 2007); EUS (N/A, 2011, 2012); Laparoscopic right colectomy (Right, 06/22/2015); Breast biopsy (Right, 2011); Breast excisional biopsy (Right, 2001); Breast excisional biopsy (Right, 2008); Breast excisional biopsy (Left, 10/26/2012); right breast cancer; Trabeculectomy (Left, 03/05/2016); and Cataract extraction w/PHACO (Left, 03/05/2016). Her primarily concern today is the Neck Pain (across both shoulders worse on the left today)  Initial Vital Signs: There were no vitals taken for this visit. BMI: 17.34 kg/m  Risk Assessment: Allergies: Reviewed. She is allergic to influenza vaccines; ciprofloxacin; flu virus vaccine; sulfa antibiotics; and tetracyclines & related.  Allergy Precautions: None required Coagulopathies: Reviewed. None identified.  Blood-thinner therapy: None at this time Active Infection(s): Reviewed. None identified. Elaine Clayton is afebrile  Site Confirmation: Elaine Clayton was asked to confirm the procedure and laterality before marking the site Procedure checklist: Completed Consent: Before the procedure and under the influence of no sedative(s), amnesic(s), or anxiolytics, the patient was informed of the treatment options, risks and possible complications. To fulfill our ethical and legal obligations, as recommended by the American Medical Association's Code of Ethics, I have informed the patient of my clinical impression; the nature and purpose of the treatment or procedure; the risks, benefits, and possible complications of the intervention; the alternatives, including doing nothing; the risk(s) and benefit(s) of the alternative treatment(s) or procedure(s); and the risk(s) and benefit(s) of doing nothing. The patient was provided information about the general risks and possible complications associated with the procedure. These may include, but are not limited to:  failure to achieve desired goals, infection, bleeding, organ or nerve damage, allergic reactions, paralysis, and death. In addition, the patient was informed of those risks and complications associated to Spine-related procedures, such as failure to decrease  pain; infection (i.e.: Meningitis, epidural or intraspinal abscess); bleeding (i.e.: epidural hematoma, subarachnoid hemorrhage, or any other type of intraspinal or peri-dural bleeding); organ or nerve damage (i.e.: Any type of peripheral nerve, nerve root, or spinal cord injury) with subsequent damage to sensory, motor, and/or autonomic systems, resulting in permanent pain, numbness, and/or weakness of one or several areas of the body; allergic reactions; (i.e.: anaphylactic reaction); and/or death. Furthermore, the patient was informed of those risks and complications associated with the medications. These include, but are not limited to: allergic reactions (i.e.: anaphylactic or anaphylactoid reaction(s)); adrenal axis suppression; blood sugar elevation that in diabetics may result in ketoacidosis or comma; water retention that in patients with history of congestive heart failure may result in shortness of breath, pulmonary edema, and decompensation with resultant heart failure; weight gain; swelling or edema; medication-induced neural toxicity; particulate matter embolism and blood vessel occlusion with resultant organ, and/or nervous system infarction; and/or aseptic necrosis of one or more joints. Finally, the patient was informed that Medicine is not an exact science; therefore, there is also the possibility of unforeseen or unpredictable risks and/or possible complications that may result in a catastrophic outcome. The patient indicated having understood very clearly. We have given the patient no guarantees and we have made no promises. Enough time was given to the patient to ask questions, all of which were answered to the patient's satisfaction. Ms.  Clayton has indicated that she wanted to continue with the procedure. Attestation: I, the ordering provider, attest that I have discussed with the patient the benefits, risks, side-effects, alternatives, likelihood of achieving goals, and potential problems during recovery for the procedure that I have provided informed consent. Date: 05/11/2017; Time: 8:11 AM  Pre-Procedure Preparation:  Monitoring: As per clinic protocol. Respiration, ETCO2, SpO2, BP, heart rate and rhythm monitor placed and checked for adequate function Safety Precautions: Patient was assessed for positional comfort and pressure points before starting the procedure. Time-out: I initiated and conducted the "Time-out" before starting the procedure, as per protocol. The patient was asked to participate by confirming the accuracy of the "Time Out" information. Verification of the correct person, site, and procedure were performed and confirmed by me, the nursing staff, and the patient. "Time-out" conducted as per Joint Commission's Universal Protocol (UP.01.01.01). "Time-out" Date & Time: 05/11/2017; 0940 hrs.  Description of Procedure Process:   Position: Prone with head of the table was raised to facilitate breathing. Target Area: For Epidural Steroid injections the target is the interlaminar space, initially targeting the lower border of the superior vertebral body lamina. Approach: Paramedial approach. Area Prepped: Entire PosteriorCervical Region Prepping solution: ChloraPrep (2% chlorhexidine gluconate and 70% isopropyl alcohol) Safety Precautions: Aspiration looking for blood return was conducted prior to all injections. At no point did we inject any substances, as a needle was being advanced. No attempts were made at seeking any paresthesias. Safe injection practices and needle disposal techniques used. Medications properly checked for expiration dates. SDV (single dose vial) medications used. Description of the Procedure:  Protocol guidelines were followed. The procedure needle was introduced through the skin, ipsilateral to the reported pain, and advanced to the target area. Bone was contacted and the needle walked caudad, until the lamina was cleared. The epidural space was identified using "loss-of-resistance technique" with 2-3 ml of PF-NaCl (0.9% NSS), in a 5cc LOR glass syringe. Vitals:   05/11/17 0947 05/11/17 0957 05/11/17 1007 05/11/17 1017  BP: 116/80 (!) 146/74 (!) 148/70 115/84  Pulse:  Resp: 11 11 17 13   Temp:  97.4 F (36.3 C)    TempSrc:      SpO2: 95% 100% 100% 99%  Weight:      Height:        Start Time: 0940 hrs. End Time: 0946 hrs. Materials:  Needle(s) Type: Epidural needle Gauge: 22G Length: 3.5-in Medication(s): We administered lactated ringers, midazolam, fentaNYL, dexamethasone, iopamidol, lidocaine (PF), sodium chloride flush, and ropivacaine (PF) 2 mg/mL (0.2%). Please see chart orders for dosing details.  Imaging Guidance (Spinal):  Type of Imaging Technique: Fluoroscopy Guidance (Spinal) Indication(s): Assistance in needle guidance and placement for procedures requiring needle placement in or near specific anatomical locations not easily accessible without such assistance. Exposure Time: Please see nurses notes. Contrast: Before injecting any contrast, we confirmed that the patient did not have an allergy to iodine, shellfish, or radiological contrast. Once satisfactory needle placement was completed at the desired level, radiological contrast was injected. Contrast injected under live fluoroscopy. No contrast complications. See chart for type and volume of contrast used. Fluoroscopic Guidance: I was personally present during the use of fluoroscopy. "Tunnel Vision Technique" used to obtain the best possible view of the target area. Parallax error corrected before commencing the procedure. "Direction-depth-direction" technique used to introduce the needle under continuous pulsed  fluoroscopy. Once target was reached, antero-posterior, oblique, and lateral fluoroscopic projection used confirm needle placement in all planes. Images permanently stored in EMR. Interpretation: I personally interpreted the imaging intraoperatively. Adequate needle placement confirmed in multiple planes. Appropriate spread of contrast into desired area was observed. No evidence of afferent or efferent intravascular uptake. No intrathecal or subarachnoid spread observed. Permanent images saved into the patient's record.  Antibiotic Prophylaxis:  Indication(s): None identified Antibiotic given: None  Post-operative Assessment:  EBL: None Complications: No immediate post-treatment complications observed by team, or reported by patient. Note: The patient tolerated the entire procedure well. A repeat set of vitals were taken after the procedure and the patient was kept under observation following institutional policy, for this type of procedure. Post-procedural neurological assessment was performed, showing return to baseline, prior to discharge. The patient was provided with post-procedure discharge instructions, including a section on how to identify potential problems. Should any problems arise concerning this procedure, the patient was given instructions to immediately contact us, at any time, without hesitation. In any case, we plan to contact the patient by telephone for a follow-up status report regarding this interventional procedure. Comments:  No additional relevant information.  Plan of Care  Disposition: Discharge home  Discharge Date & Time: 05/11/2017; 1025 hrs.  Physician-requested Follow-up:  Return in about 2 weeks (around 05/25/2017) for post-procedure eval (in 2 wks), by MD.  Future Appointments Date Time Provider [place]  05/20/2017 9:00 AM [facility]  06/09/2017 8:00 AM Milinda Pointer, MD [facility] None  07/02/2017 12:20 PM [facility] [place]    07/20/2017 1:30 PM Byrnett, Forest Gleason, MD [facility] None  02/10/2018 1:30 PM Dohmeier, Asencion Partridge, MD [facility] None   Medications ordered for procedure: Meds ordered this encounter  Medications  . lactated ringers infusion 1,000 mL  . midazolam (VERSED) 5 MG/5ML injection 1-2 mg    Make sure Flumazenil is available in the pyxis when using this medication. If oversedation occurs, administer 0.2 mg IV over 15 sec. If after 45 sec no response, administer 0.2 mg again over 1 min; may repeat at 1 min intervals; not to exceed 4 doses (1 mg)  . fentaNYL (SUBLIMAZE) injection  25-50 mcg    Make sure Narcan is available in the pyxis when using this medication. In the event of respiratory depression (RR< 8/min): Titrate NARCAN (naloxone) in increments of 0.1 to 0.2 mg IV at 2-3 minute intervals, until desired degree of reversal.  . dexamethasone (DECADRON) injection 10 mg  . iopamidol (ISOVUE-M) 41 % intrathecal injection 10 mL  . lidocaine (PF) (XYLOCAINE) 1 % injection 10 mL  . sodium chloride flush (NS) 0.9 % injection 1 mL  . ropivacaine (PF) 2 mg/mL (0.2%) (NAROPIN) injection 1 mL   Medications administered: We administered lactated ringers, midazolam, fentaNYL, dexamethasone, iopamidol, lidocaine (PF), sodium chloride flush, and ropivacaine (PF) 2 mg/mL (0.2%).  See the medical record for exact dosing, route, and time of administration.  Lab-work, Procedure(s), & Referral(s) Ordered: Orders Placed This Encounter  Procedures  . Cervical Epidural Injection  . DG C-Arm 1-60 Min-No Report  . Informed Consent Details: Transcribe to consent form and obtain patient signature  . Provider attestation of informed consent for procedure/surgical case  . Verify informed consent  . Discharge instructions  . Follow-up   Imaging Ordered: New Prescriptions   No medications on file   Primary Care Physician: Idelle Crouch, MD Location: [facility] Note by: Kathlen Brunswick.  Dossie Arbour, M.D, DABA, DABAPM, DABPM, DABIPP, FIPP Date: 05/11/2017; Time: 11:08 AM  Disclaimer:  Medicine is not an Chief Strategy Officer. The only guarantee in medicine is that nothing is guaranteed. It is important to note that the decision to proceed with this intervention was based on the information collected from the patient. The Data and conclusions were drawn from the patient's questionnaire, the interview, and the physical examination. Because the information was provided in large part by the patient, it cannot be guaranteed that it has not been purposely or unconsciously manipulated. Every effort has been made to obtain as much relevant data as possible for this evaluation. It is important to note that the conclusions that lead to this procedure are derived in large part from the available data. Always take into account that the treatment will also be dependent on availability of resources and existing treatment guidelines, considered by other Pain Management Practitioners as being common knowledge and practice, at the time of the intervention. For Medico-Legal purposes, it is also important to point out that variation in procedural techniques and pharmacological choices are the acceptable norm. The indications, contraindications, technique, and results of the above procedure should only be interpreted and judged by a Board-Certified Interventional Pain Specialist with extensive familiarity and expertise in the same exact procedure and technique.  Instructions provided at this appointment: Patient Instructions   Post-Procedure instructions Instructions:  Apply ice: Fill a plastic sandwich bag with crushed ice. Cover it with a small towel and apply to injection site. Apply for 15 minutes then remove x 15 minutes. Repeat sequence on day of procedure, until you go to bed. The purpose is to minimize swelling and discomfort after procedure.  Apply heat: Apply heat to procedure site starting the day following  the procedure. The purpose is to treat any soreness and discomfort from the procedure.  Food intake: Start with clear liquids (like water) and advance to regular food, as tolerated.   Physical activities: Keep activities to a minimum for the first 8 hours after the procedure.   Driving: If you have received any sedation, you are not allowed to drive for 24 hours after your procedure.  Blood thinner: Restart your blood thinner 6 hours  after your procedure. (Only for those taking blood thinners)  Insulin: As soon as you can eat, you may resume your normal dosing schedule. (Only for those taking insulin)  Infection prevention: Keep procedure site clean and dry.  Post-procedure Pain Diary: Extremely important that this be done correctly and accurately. Recorded information will be used to determine the next step in treatment.  Pain evaluated is that of treated area only. Do not include pain from an untreated area.  Complete every hour, on the hour, for the initial 8 hours. Set an alarm to help you do this part accurately.  Do not go to sleep and have it completed later. It will not be accurate.  Follow-up appointment: Keep your follow-up appointment after the procedure. Usually 2 weeks for most procedures. (6 weeks in the case of radiofrequency.) Bring you pain diary.  Expect:  From numbing medicine (AKA: Local Anesthetics): Numbness or decrease in pain.  Onset: Full effect within 15 minutes of injected.  Duration: It will depend on the type of local anesthetic used. On the average, 1 to 8 hours.   From steroids: Decrease in swelling or inflammation. Once inflammation is improved, relief of the pain will follow.  Onset of benefits: Depends on the amount of swelling present. The more swelling, the longer it will take for the benefits to be seen.   Duration: Steroids will stay in the system x 2 weeks. Duration of benefits will depend on multiple posibilities including persistent  irritating factors.  From procedure: Some discomfort is to be expected once the numbing medicine wears off. This should be minimal if ice and heat are applied as instructed. Call if:  You experience numbness and weakness that gets worse with time, as opposed to wearing off.  New onset bowel or bladder incontinence. (Spinal procedures only)  Emergency Numbers:  Durning business hours (Monday - Thursday, 8:00 AM - 4:00 PM) (Friday, 9:00 AM - 12:00 Noon): [phone]  After hours: [phone] _____________________________________________________________________________________________  Pain Management Discharge Instructions  General Discharge Instructions :  If you need to reach your doctor call: Monday-Friday 8:00 am - 4:00 pm at [phone] or toll free [phone].  After clinic hours [phone] to have operator reach doctor.  Bring all of your medication bottles to all your appointments in the pain clinic.  To cancel or reschedule your appointment with Pain Management please remember to call 24 hours in advance to avoid a fee.  Refer to the educational materials which you have been given on: General Risks, I had my Procedure. Discharge Instructions, Post Sedation.  Post Procedure Instructions:  The drugs you were given will stay in your system until tomorrow, so for the next 24 hours you should not drive, make any legal decisions or drink any alcoholic beverages.  You may eat anything you prefer, but it is better to start with liquids then soups and crackers, and gradually work up to solid foods.  Please notify your doctor immediately if you have any unusual bleeding, trouble breathing or pain that is not related to your normal pain.  Depending on the type of procedure that was done, some parts of your body may feel week and/or numb.  This usually clears up by tonight or the next day.  Walk with the use of an assistive device or accompanied by an adult for the 24  hours.  You may use ice on the affected area for the first 24 hours.  Put ice in a Ziploc bag and cover with  a towel and place against area 15 minutes on 15 minutes off.  You may switch to heat after 24 hours.GENERAL RISKS AND COMPLICATIONS  What are the risk, side effects and possible complications? Generally speaking, most procedures are safe.  However, with any procedure there are risks, side effects, and the possibility of complications.  The risks and complications are dependent upon the sites that are lesioned, or the type of nerve block to be performed.  The closer the procedure is to the spine, the more serious the risks are.  Great care is taken when placing the radio frequency needles, block needles or lesioning probes, but sometimes complications can occur. 1. Infection: Any time there is an injection through the skin, there is a risk of infection.  This is why sterile conditions are used for these blocks.  There are four possible types of infection. 1. Localized skin infection. 2. Central Nervous System Infection-This can be in the form of Meningitis, which can be deadly. 3. Epidural Infections-This can be in the form of an epidural abscess, which can cause pressure inside of the spine, causing compression of the spinal cord with subsequent paralysis. This would require an emergency surgery to decompress, and there are no guarantees that the patient would recover from the paralysis. 4. Discitis-This is an infection of the intervertebral discs.  It occurs in about 1% of discography procedures.  It is difficult to treat and it may lead to surgery.        2. Pain: the needles have to go through skin and soft tissues, will cause soreness.       3. Damage to internal structures:  The nerves to be lesioned may be near blood vessels or    other nerves which can be potentially damaged.       4. Bleeding: Bleeding is more common if the patient is taking blood thinners such as  aspirin, Coumadin,  Ticiid, Plavix, etc., or if he/she have some genetic predisposition  such as hemophilia. Bleeding into the spinal canal can cause compression of the spinal  cord with subsequent paralysis.  This would require an emergency surgery to  decompress and there are no guarantees that the patient would recover from the  paralysis.       5. Pneumothorax:  Puncturing of a lung is a possibility, every time a needle is introduced in  the area of the chest or upper back.  Pneumothorax refers to free air around the  collapsed lung(s), inside of the thoracic cavity (chest cavity).  Another two possible  complications related to a similar event would include: Hemothorax and Chylothorax.   These are variations of the Pneumothorax, where instead of air around the collapsed  lung(s), you may have blood or chyle, respectively.       6. Spinal headaches: They may occur with any procedures in the area of the spine.       7. Persistent CSF (Cerebro-Spinal Fluid) leakage: This is a rare problem, but may occur  with prolonged intrathecal or epidural catheters either due to the formation of a fistulous  track or a dural tear.       8. Nerve damage: By working so close to the spinal cord, there is always a possibility of  nerve damage, which could be as serious as a permanent spinal cord injury with  paralysis.       9. Death:  Although rare, severe deadly allergic reactions known as "Anaphylactic  reaction" can occur to any of  the medications used.      10. Worsening of the symptoms:  We can always make thing worse.  What are the chances of something like this happening? Chances of any of this occuring are extremely low.  By statistics, you have more of a chance of getting killed in a motor vehicle accident: while driving to the hospital than any of the above occurring .  Nevertheless, you should be aware that they are possibilities.  In general, it is similar to taking a shower.  Everybody knows that you can slip, hit your head and  get killed.  Does that mean that you should not shower again?  Nevertheless always keep in mind that statistics do not mean anything if you happen to be on the wrong side of them.  Even if a procedure has a 1 (one) in a 1,000,000 (million) chance of going wrong, it you happen to be that one..Also, keep in mind that by statistics, you have more of a chance of having something go wrong when taking medications.  Who should not have this procedure? If you are on a blood thinning medication (e.g. Coumadin, Plavix, see list of "Blood Thinners"), or if you have an active infection going on, you should not have the procedure.  If you are taking any blood thinners, please inform your physician.  How should I prepare for this procedure?  Do not eat or drink anything at least six hours prior to the procedure.  Bring a driver with you .  It cannot be a taxi.  Come accompanied by an adult that can drive you back, and that is strong enough to help you if your legs get weak or numb from the local anesthetic.  Take all of your medicines the morning of the procedure with just enough water to swallow them.  If you have diabetes, make sure that you are scheduled to have your procedure done first thing in the morning, whenever possible.  If you have diabetes, take only half of your insulin dose and notify our nurse that you have done so as soon as you arrive at the clinic.  If you are diabetic, but only take blood sugar pills (oral hypoglycemic), then do not take them on the morning of your procedure.  You may take them after you have had the procedure.  Do not take aspirin or any aspirin-containing medications, at least eleven (11) days prior to the procedure.  They may prolong bleeding.  Wear loose fitting clothing that may be easy to take off and that you would not mind if it got stained with Betadine or blood.  Do not wear any jewelry or perfume  Remove any nail coloring.  It will interfere with some of  our monitoring equipment.  NOTE: Remember that this is not meant to be interpreted as a complete list of all possible complications.  Unforeseen problems may occur.  BLOOD THINNERS The following drugs contain aspirin or other products, which can cause increased bleeding during surgery and should not be taken for 2 weeks prior to and 1 week after surgery.  If you should need take something for relief of minor pain, you may take acetaminophen which is found in Tylenol,m Datril, Anacin-3 and Panadol. It is not blood thinner. The products listed below are.  Do not take any of the products listed below in addition to any listed on your instruction sheet.  A.P.C or A.P.C with Codeine Codeine Phosphate Capsules #3 Ibuprofen Ridaura  ABC compound Congesprin  Imuran rimadil  Advil Cope Indocin Robaxisal  Alka-Seltzer Effervescent Pain Reliever and Antacid Coricidin or Coricidin-D  Indomethacin Rufen  Alka-Seltzer plus Cold Medicine Cosprin Ketoprofen S-A-C Tablets  Anacin Analgesic Tablets or Capsules Coumadin Korlgesic Salflex  Anacin Extra Strength Analgesic tablets or capsules CP-2 Tablets Lanoril Salicylate  Anaprox Cuprimine Capsules Levenox Salocol  Anexsia-D Dalteparin Magan Salsalate  Anodynos Darvon compound Magnesium Salicylate Sine-off  Ansaid Dasin Capsules Magsal Sodium Salicylate  Anturane Depen Capsules Marnal Soma  APF Arthritis pain formula Dewitt's Pills Measurin Stanback  Argesic Dia-Gesic Meclofenamic Sulfinpyrazone  Arthritis Bayer Timed Release Aspirin Diclofenac Meclomen Sulindac  Arthritis pain formula Anacin Dicumarol Medipren Supac  Analgesic (Safety coated) Arthralgen Diffunasal Mefanamic Suprofen  Arthritis Strength Bufferin Dihydrocodeine Mepro Compound Suprol  Arthropan liquid Dopirydamole Methcarbomol with Aspirin Synalgos  ASA tablets/Enseals Disalcid Micrainin Tagament  Ascriptin Doan's Midol Talwin  Ascriptin A/D Dolene Mobidin Tanderil  Ascriptin Extra Strength  Dolobid Moblgesic Ticlid  Ascriptin with Codeine Doloprin or Doloprin with Codeine Momentum Tolectin  Asperbuf Duoprin Mono-gesic Trendar  Aspergum Duradyne Motrin or Motrin IB Triminicin  Aspirin plain, buffered or enteric coated Durasal Myochrisine Trigesic  Aspirin Suppositories Easprin Nalfon Trillsate  Aspirin with Codeine Ecotrin Regular or Extra Strength Naprosyn Uracel  Atromid-S Efficin Naproxen Ursinus  Auranofin Capsules Elmiron Neocylate Vanquish  Axotal Emagrin Norgesic Verin  Azathioprine Empirin or Empirin with Codeine Normiflo Vitamin E  Azolid Emprazil Nuprin Voltaren  Bayer Aspirin plain, buffered or children's or timed BC Tablets or powders Encaprin Orgaran Warfarin Sodium  Buff-a-Comp Enoxaparin Orudis Zorpin  Buff-a-Comp with Codeine Equegesic Os-Cal-Gesic   Buffaprin Excedrin plain, buffered or Extra Strength Oxalid   Bufferin Arthritis Strength Feldene Oxphenbutazone   Bufferin plain or Extra Strength Feldene Capsules Oxycodone with Aspirin   Bufferin with Codeine Fenoprofen Fenoprofen Pabalate or Pabalate-SF   Buffets II Flogesic Panagesic   Buffinol plain or Extra Strength Florinal or Florinal with Codeine Panwarfarin   Buf-Tabs Flurbiprofen Penicillamine   Butalbital Compound Four-way cold tablets Penicillin   Butazolidin Fragmin Pepto-Bismol   Carbenicillin Geminisyn Percodan   Carna Arthritis Reliever Geopen Persantine   Carprofen Gold's salt Persistin   Chloramphenicol Goody's Phenylbutazone   Chloromycetin Haltrain Piroxlcam   Clmetidine heparin Plaquenil   Cllnoril Hyco-pap Ponstel   Clofibrate Hydroxy chloroquine Propoxyphen         Before stopping any of these medications, be sure to consult the physician who ordered them.  Some, such as Coumadin (Warfarin) are ordered to prevent or treat serious conditions such as "deep thrombosis", "pumonary embolisms", and other heart problems.  The amount of time that you may need off of the medication may also  vary with the medication and the reason for which you were taking it.  If you are taking any of these medications, please make sure you notify your pain physician before you undergo any procedures.          Epidural Steroid Injection An epidural steroid injection is a shot of steroid medicine and numbing medicine that is given into the space between the spinal cord and the bones in your back (epidural space). The shot helps relieve pain caused by an irritated or swollen nerve root. The amount of pain relief you get from the injection depends on what is causing the nerve to be swollen and irritated, and how long your pain lasts. You are more likely to benefit from this injection if your pain is strong and comes on suddenly rather than if you have had pain for a  long time. Tell a health care provider about:  Any allergies you have.  All medicines you are taking, including vitamins, herbs, eye drops, creams, and over-the-counter medicines.  Any problems you or family members have had with anesthetic medicines.  Any blood disorders you have.  Any surgeries you have had.  Any medical conditions you have.  Whether you are pregnant or may be pregnant. What are the risks? Generally, this is a safe procedure. However, problems may occur, including:  Headache.  Bleeding.  Infection.  Allergic reaction to medicines.  Damage to your nerves. What happens before the procedure? Staying hydrated  Follow instructions from your health care provider about hydration, which may include:  Up to 2 hours before the procedure - you may continue to drink clear liquids, such as water, clear fruit juice, black coffee, and plain tea. Eating and drinking restrictions  Follow instructions from your health care provider about eating and drinking, which may include:  8 hours before the procedure - stop eating heavy meals or foods such as meat, fried foods, or fatty foods.  6 hours before the  procedure - stop eating light meals or foods, such as toast or cereal.  6 hours before the procedure - stop drinking milk or drinks that contain milk.  2 hours before the procedure - stop drinking clear liquids. Medicine   You may be given medicines to lower anxiety.  Ask your health care provider about:  Changing or stopping your regular medicines. This is especially important if you are taking diabetes medicines or blood thinners.  Taking medicines such as aspirin and ibuprofen. These medicines can thin your blood. Do not take these medicines before your procedure if your health care provider instructs you not to. General instructions   Plan to have someone take you home from the hospital or clinic. What happens during the procedure?  You may receive a medicine to help you relax (sedative).  You will be asked to lie on your abdomen.  The injection site will be cleaned.  A numbing medicine (local anesthetic) will be used to numb the injection site.  A needle will be inserted through your skin into the epidural space. You may feel some discomfort when this happens. An X-ray machine will be used to make sure the needle is put as close as possible to the affected nerve.  A steroid medicine and a local anesthetic will be injected into the epidural space.  The needle will be removed.  A bandage (dressing) will be put over the injection site. What happens after the procedure?  Your blood pressure, heart rate, breathing rate, and blood oxygen level will be monitored until the medicines you were given have worn off.  Your arm or leg may feel weak or numb for a few hours.  The injection site may feel sore.  Do not drive for 24 hours if you received a sedative. This information is not intended to replace advice given to you by your health care provider. Make sure you discuss any questions you have with your health care provider. Document Released: 03/16/2008 Document Revised:  05/21/2016 Document Reviewed: 03/25/2016 Elsevier Interactive Patient Education  2017 [employer].

## 2017-05-11 NOTE — Telephone Encounter (Signed)
msg left

## 2017-05-11 NOTE — Telephone Encounter (Signed)
Pt calling for yearly prior auth for climara Pro. Not sure if she gets paperwork or do we get it.  Adv pharm will let us know.

## 2017-05-12 ENCOUNTER — Telehealth: Payer: Self-pay

## 2017-05-12 ENCOUNTER — Encounter: Payer: Self-pay | Admitting: Pain Medicine

## 2017-05-12 DIAGNOSIS — G608 Other hereditary and idiopathic neuropathies: Secondary | ICD-10-CM | POA: Insufficient documentation

## 2017-05-12 NOTE — Telephone Encounter (Signed)
Denies any needs at this time- states she is doing good.

## 2017-05-20 ENCOUNTER — Ambulatory Visit
Admission: RE | Admit: 2017-05-20 | Discharge: 2017-05-20 | Disposition: A | Discharge status: Home / Self Care | Payer: Medicare Other | Source: Ambulatory Visit | Attending: Pain Medicine | Admitting: Pain Medicine

## 2017-05-20 DIAGNOSIS — M5442 Lumbago with sciatica, left side: Secondary | ICD-10-CM | POA: Diagnosis not present

## 2017-05-20 DIAGNOSIS — M48061 Spinal stenosis, lumbar region without neurogenic claudication: Secondary | ICD-10-CM | POA: Diagnosis not present

## 2017-05-20 DIAGNOSIS — G8929 Other chronic pain: Secondary | ICD-10-CM | POA: Insufficient documentation

## 2017-05-20 DIAGNOSIS — M5137 Other intervertebral disc degeneration, lumbosacral region: Secondary | ICD-10-CM | POA: Diagnosis not present

## 2017-05-20 DIAGNOSIS — M5441 Lumbago with sciatica, right side: Secondary | ICD-10-CM | POA: Diagnosis not present

## 2017-05-20 DIAGNOSIS — R200 Anesthesia of skin: Secondary | ICD-10-CM | POA: Insufficient documentation

## 2017-05-20 DIAGNOSIS — M5416 Radiculopathy, lumbar region: Secondary | ICD-10-CM | POA: Diagnosis not present

## 2017-05-20 DIAGNOSIS — M4317 Spondylolisthesis, lumbosacral region: Secondary | ICD-10-CM | POA: Insufficient documentation

## 2017-05-20 DIAGNOSIS — M4186 Other forms of scoliosis, lumbar region: Secondary | ICD-10-CM | POA: Insufficient documentation

## 2017-05-20 DIAGNOSIS — M48062 Spinal stenosis, lumbar region with neurogenic claudication: Secondary | ICD-10-CM | POA: Diagnosis not present

## 2017-05-20 DIAGNOSIS — R202 Paresthesia of skin: Secondary | ICD-10-CM | POA: Diagnosis not present

## 2017-05-20 DIAGNOSIS — M4316 Spondylolisthesis, lumbar region: Secondary | ICD-10-CM | POA: Insufficient documentation

## 2017-05-20 DIAGNOSIS — M5136 Other intervertebral disc degeneration, lumbar region: Secondary | ICD-10-CM | POA: Insufficient documentation

## 2017-05-27 NOTE — Progress Notes (Signed)
Results were reviewed and found to be: abnormal  Initial impression would suggest no surgically correctable pathology  Review would suggest interventional pain management techniques may be of benefit

## 2017-06-08 NOTE — Progress Notes (Signed)
Patient's Name: Elaine Clayton  MRN: [id number]  Referring Provider: Idelle Crouch, MD  DOB: 01-24-1946  PCP: Idelle Crouch, MD  DOS: 06/09/2017  Note by: Kathlen Brunswick. Dossie Arbour, MD  Service setting: Ambulatory outpatient  Specialty: Interventional Pain Management  Location: [facility] (AMB) Pain Management Facility    Patient type: Established   Primary Reason(s) for Visit: Encounter for post-procedure evaluation of chronic illness with mild to moderate exacerbation CC: Neck Pain and Back Pain  HPI  Elaine Clayton is a 71 y.o. year old, female patient, who comes today for a post-procedure evaluation. She has Breast cancer of upper-outer quadrant of right female breast ([place]); Personal history of malignant neoplasm of breast; Colon polyp; Loss of weight; Cervical spine disease; Pituitary microadenoma ([place]); History of breast cancer in adulthood; Dysmetria; Breast cancer ([place]); Bipolar disorder ([place]); Chronic pancreatitis ([place]); COPD (chronic obstructive pulmonary disease) (HCC); HTN (hypertension); OA (osteoarthritis); Renal insufficiency; Chronic pain syndrome; Chronic shoulder pain (Location of Primary Source of Pain) (Bilateral) (L>R); Osteoarthritis of shoulder (Bilateral); Chronic neck pain (Location of Secondary source of pain) (Bilateral) (L>R); History of C3-5 ACDF; Numbness and tingling in left upper extremity; Numbness and tingling of right upper extremity; Upper extremity weakness (Bilateral) (L>R); Numbness of upper extremity (Bilateral) (L>R); Numbness and tingling of lower extremity (Bilateral) (L>R); Weakness of lower extremity (Bilateral) (R>L); Chronic abdominal pain (epigastric); Cervical syndrome; Closed fracture of metatarsal bone; 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); 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); Abnormal MRI, cervical spine; 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[place]); Cervical radiculitis; Chronic lumbar radiculopathy (Bilateral) (L>R) (left L5); Chronic low back pain  (Bilateral) (L>R); Abnormal MRI, lumbar spine; 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 Her primarily concern today is the Neck Pain and Back Pain  Pain Assessment: Self-Reported Pain Score: 3 /10             Reported level is compatible with observation.       Pain Location: Neck Pain Descriptors / Indicators: Aching, Sharp Pain Frequency: Intermittent  Elaine Clayton comes in today for post-procedure evaluation after the treatment done on 05/11/2017. The patient comes in indicating significant improvement of her cervical radiculopathy with a 50% decrease in her pain. Her pain appears to have gone from constant to intermittent. However, she is very much concerned about her left lower extremity numbness and weakness and the possibility of her falling because of this. She has requested that we move on to the lumbar area for treatment. Today we went over the results of her lumbar MRI in great detail and we also reviewed the cervical MRI results.  Further details on both, my assessment(s), as well as the proposed treatment plan, please see below.  Post-Procedure Assessment  05/11/2017 Procedure: Diagnostic left cervical epidural steroid injection under fluoroscopic guidance and IV sedation Pre-procedure pain score:  4/10 Post-procedure pain score: 0/10 (100% relief) Influential Factors: BMI: 17.07 kg/m Intra-procedural challenges: None observed Assessment challenges: None detected         Post-procedural adverse reactions or complications: None reported Reported  side-effects: None  Sedation: Sedation provided. When no sedatives are used, the analgesic levels obtained are directly associated to the effectiveness of  the local anesthetics. However, when sedation is provided, the level of analgesia obtained during the initial 1 hour following the intervention, is believed to be the result of a combination of factors. These factors may include, but are not limited to: 1. The effectiveness of the local anesthetics used. 2. The effects of the analgesic(s) and/or anxiolytic(s) used. 3. The degree of discomfort experienced by the patient at the time of the procedure. 4. The patients ability and reliability in recalling and recording the events. 5. The presence and influence of possible secondary gains and/or psychosocial factors. Reported result: Relief experienced during the 1st hour after the procedure: 100 % (Ultra-Short Term Relief) Interpretative annotation: Analgesia during this period is likely to be Local Anesthetic and/or IV Sedative (Analgesic/Anxiolitic) related.          Effects of local anesthetic: The analgesic effects attained during this period are directly associated to the localized infiltration of local anesthetics and therefore cary significant diagnostic value as to the etiological location, or anatomical origin, of the pain. Expected duration of relief is directly dependent on the pharmacodynamics of the local anesthetic used. Long-acting (4-6 hours) anesthetics used.  Reported result: Relief during the next 4 to 6 hour after the procedure: 50 % (Short-Term Relief) Interpretative annotation: Complete relief would suggest area to be the source of the pain.          Long-term benefit: Defined as the period of time past the expected duration of local anesthetics. With the possible exception of prolonged sympathetic blockade from the local anesthetics, benefits during this period are typically attributed to, or associated with, other factors such as  analgesic sensory neuropraxia, antiinflammatory effects, or beneficial biochemical changes provided by agents other than the local anesthetics Reported result: Extended relief following procedure: 50 % (Long-Term Relief) Interpretative annotation: Good relief. This could suggest inflammation to be a significant component in the etiology to the pain.          Current benefits: Defined as persistent relief that continues at this point in time.   Reported results: Treated area: 50 %       Interpretative annotation: Ongoing benefits would suggest effective therapeutic approach  Interpretation: Results would suggest a successful diagnostic intervention.          Laboratory Chemistry  Inflammation Markers Lab Results  Component Value Date   CRP <0.8 03/19/2017   ESRSEDRATE 4 03/19/2017   (CRP: Acute Phase) (ESR: Chronic Phase) Renal Function Markers Lab Results  Component Value Date   BUN 12 03/19/2017   CREATININE 0.99 03/19/2017   GFRAA >60 03/19/2017   GFRNONAA 57 (L) 03/19/2017   Hepatic Function Markers Lab Results  Component Value Date   AST 22 03/19/2017   ALT 14 03/19/2017   ALBUMIN 4.5 03/19/2017   ALKPHOS 53 03/19/2017   Electrolytes Lab Results  Component Value Date   NA 139 03/19/2017   K 4.0 03/19/2017   CL 107 03/19/2017   CALCIUM 9.5 03/19/2017   MG 2.1 03/19/2017   Neuropathy Markers Lab Results  Component Value Date   VITAMINB12 558 03/19/2017   Bone Pathology Markers Lab Results  Component Value Date   ALKPHOS 53 03/19/2017   25OHVITD1 57 03/19/2017   25OHVITD2 1.2 03/19/2017   25OHVITD3 56 03/19/2017   CALCIUM 9.5 03/19/2017   Coagulation Parameters Lab Results  Component Value Date   PLT 206 06/25/2015   Cardiovascular Markers Lab Results  Component Value Date   HGB 10.0 (L) 06/25/2015   HCT 31.1 (  L) 06/25/2015   Note: Lab results reviewed.  Recent Diagnostic Imaging Review  Mr Lumbar Spine Wo Contrast Result Date:  05/20/2017 CLINICAL DATA:  71 y/o female with approximately 3 years of lumbar back pain, foot drop, left greater than right thigh and numbness, loss of balance. EXAM: MRI LUMBAR SPINE WITHOUT CONTRAST TECHNIQUE: Multiplanar, multisequence MR imaging of the lumbar spine was performed. No intravenous contrast was administered. COMPARISON:  Abdomen MRI 02/18/2010. CT Abdomen and Pelvis 02/05/2010. FINDINGS: Segmentation: Lumbar segmentation appears to be normal and will be designated as such for this report. Alignment: Chronic dextroconvex upper and levoconvex lower lumbar scoliosis. Mild grade 1 anterolisthesis of L3 on L4, L4 on L5, and L5 on S1. Mildly exaggerated upper lumbar lordosis. Vertebrae: Chronic degenerative endplate marrow signal changes, especially at L4-L5. Mild superimposed degenerative anterior endplate marrow edema at that level (series 4, image 8). No other marrow edema or acute osseous abnormality. Visible sacrum is intact. Conus medullaris: Extends to the L1 level and appears normal. Paraspinal and other soft tissues: Negative. Disc levels: Lower thoracic disc space loss but no visible lower thoracic spinal stenosis. T12-L1: Mild disc bulge and facet hypertrophy. Mild bilateral T12 foraminal stenosis. L1-L2: Posterior disc space loss with mild disc bulge. Mild to moderate facet and ligament flavum hypertrophy. Moderate left and mild right L1 foraminal stenosis without spinal stenosis. L2-L3: Disc space loss with left eccentric circumferential disc bulge. Mild to moderate facet hypertrophy greater on the left. Moderate left L2 foraminal stenosis and mild left lateral recess stenosis (descending left L3 nerve root level) without spinal stenosis. L3-L4: Posterior disc space loss. Circumferential disc bulge with broad-based posterior component. Moderate facet and ligament flavum hypertrophy. Mild to moderate spinal (series 5, image 20) and left greater than right lateral recess stenosis (descending L4  nerve root levels). Mild to moderate bilateral L3 foraminal stenosis. L4-L5: Mild anterolisthesis. Severe disc space loss. Probable vacuum disc. Right eccentric circumferential disc osteophyte complex with broad-based posterior component. Moderate to severe ligament flavum and severe facet hypertrophy worse on the right. Mild spinal stenosis but severe right lateral recess stenosis (descending right L5 nerve root level, series 5, image 25)). Mild left and moderate right L4 foraminal stenosis. L5-S1: Mild anterolisthesis. Severe disc space loss. Circumferential disc osteophyte complex. Moderate facet hypertrophy greater on the right. No spinal or lateral recess stenosis. Mild left and moderate right L5 foraminal stenosis. IMPRESSION: 1. Chronic lumbar scoliosis with superimposed mild grade 1 spondylolisthesis from L3-L4 to L5-S1. Advanced disc and facet degeneration at those levels. 2. Subsequent mild to moderate spinal and foraminal stenosis at L3-L4 and L4-L5. Superimposed severe right lateral recess stenosis at L4-L5, query right L5 radiculitis. 3. Moderate neural foraminal stenosis also at the left L1 and both L2 nerve levels. Electronically Signed   By: Genevie Ann M.D.   On: 05/20/2017 10:08   Note: Imaging results reviewed.          Meds  The patient has a current medication list which includes the following prescription(s): alprazolam, aspirin ec, bisacodyl, brimonidine tartrate-timolol, bupropion, climara pro, dextromethorphan, esomeprazole, fluticasone furoate-vilanterol, gabapentin, ipratropium, losartan, lumigan, nicotine polacrilex, ondansetron, ranitidine, and rosuvastatin.  Current Outpatient Prescriptions on File Prior to Visit  Medication Sig  . ALPRAZolam (XANAX) 0.5 MG tablet Take 0.5 mg by mouth 2 (two) times daily as needed for sleep. 0.5-1 tablet  . aspirin EC 81 MG tablet Take 1 tablet (81 mg total) by mouth daily.  . bisacodyl (DULCOLAX) 5 MG EC tablet Take 5  mg by mouth daily as needed  for moderate constipation.  . Brimonidine Tartrate-Timolol (COMBIGAN OP) Apply 1 drop to eye 2 (two) times daily.  Marland Kitchen buPROPion (WELLBUTRIN SR) 150 MG 12 hr tablet Take 150 mg by mouth every morning. 2 tablets  . CLIMARA PRO 0.045-0.015 MG/DAY apply 1 patch every week  . dextromethorphan 15 MG/5ML syrup Take 10 mLs by mouth 4 (four) times daily as needed for cough.  . esomeprazole (NEXIUM) 40 MG capsule Take by mouth.  . fluticasone furoate-vilanterol (BREO ELLIPTA) 100-25 MCG/INH AEPB Inhale 1 puff into the lungs daily.  Marland Kitchen ipratropium (ATROVENT) 0.06 % nasal spray Place 2 sprays into both nostrils 4 (four) times daily as needed for rhinitis.  Marland Kitchen losartan (COZAAR) 25 MG tablet Take 25 mg by mouth daily.  Marland Kitchen LUMIGAN 0.01 % SOLN apply 1 drop to into right eye at bedtime once daily ONLY  . nicotine polacrilex (NICORETTE) 2 MG gum Take 2 mg by mouth as needed for smoking cessation.  . ondansetron (ZOFRAN) 8 MG tablet Take by mouth every 8 (eight) hours as needed for nausea or vomiting.  . ranitidine (ZANTAC) 150 MG tablet Take 150 mg by mouth daily.  . rosuvastatin (CRESTOR) 10 MG tablet Take 10 mg by mouth at bedtime.    No current facility-administered medications on file prior to visit.    ROS  Constitutional: Denies any fever or chills Gastrointestinal: No reported hemesis, hematochezia, vomiting, or acute GI distress Musculoskeletal: Denies any acute onset joint swelling, redness, loss of ROM, or weakness Neurological: No reported episodes of acute onset apraxia, aphasia, dysarthria, agnosia, amnesia, paralysis, loss of coordination, or loss of consciousness  Allergies  Ms. Sciuto is allergic to influenza vaccines; ciprofloxacin; flu virus vaccine; sulfa antibiotics; and tetracyclines & related.  [place]  Drug: Ms. Dulak  reports that she does not use drugs. Alcohol:  reports that she drinks alcohol. Tobacco:  reports that she quit smoking about 9 years ago. She has a 35.00 pack-year  smoking history. She has never used smokeless tobacco. Medical:  has a past medical history of Anemia; Bipolar affective disorder ([place]); Broken foot (2015); Chronic gastritis; Chronic kidney disease; COPD (chronic obstructive pulmonary disease) ([place]); Dizziness; Dysphagia; Glaucoma; Hyperlipemia; Hypertension; Lithium toxicity (2015); Malignant neoplasm of upper-outer quadrant of female breast [facility]) (May 07, 2007); Neck stiffness; Osteoarthritis; Pancreatitis; Personal history of malignant neoplasm of breast; Pituitary microadenoma ([place]); Recurrent UTI; Renal insufficiency; and Stomach ulcer (2112). Family: family history includes Breast cancer (age of onset: 85) in her mother.  Past Surgical History:  Procedure Laterality Date  . APPENDECTOMY    . BREAST BIOPSY Right 2011   neg  . BREAST EXCISIONAL BIOPSY Right 2001   neg  . BREAST EXCISIONAL BIOPSY Right 2008   breast ca 2008 radiation  . BREAST EXCISIONAL BIOPSY Left 10/26/2012   neg  . BREAST SURGERY Left  2013   left breast wide excision,Intraductal papilloma, ductal hyperplasia and sclerosing adenosis. Microcalcifications associated with columnar cell change. No evidence of atypia or malignancy. Margins are unremarkable.  Marland Kitchen BREAST SURGERY Right November 06, 1999   multiple areas of microcalcification showing evidence of sclerosing adenosis and ductal hyperplasia.  Marland Kitchen BREAST SURGERY Left May 07, 2007   wide excision.  . cataract Right 2013  . CATARACT EXTRACTION W/PHACO Left 03/05/2016   Procedure: CATARACT EXTRACTION PHACO AND INTRAOCULAR LENS PLACEMENT (IOC);  Surgeon: Leandrew Koyanagi, MD;  Location: [facility];  Service: Ophthalmology;  Laterality: Left;  . COLONOSCOPY  2004  .  COLONOSCOPY N/A 05/14/2015   Procedure: COLONOSCOPY;  Surgeon: Manya Silvas, MD;  Location: [facility] ENDOSCOPY;  Service: Endoscopy;  Laterality: N/A;  . ERCP N/A   . ESOPHAGOGASTRODUODENOSCOPY N/A 05/14/2015   Procedure: ESOPHAGOGASTRODUODENOSCOPY  (EGD);  Surgeon: Manya Silvas, MD;  Location: [facility] ENDOSCOPY;  Service: Endoscopy;  Laterality: N/A;  . EUS N/A 2011, 2012  . EYE SURGERY  2013  . GLAUCOMA SURGERY Right 2013  . LAPAROSCOPIC RIGHT COLECTOMY Right 06/22/2015   Procedure: LAPAROSCOPIC RIGHT COLECTOMY;  Surgeon: Robert Bellow, MD;  Location: [facility] ORS;  Service: General;  Laterality: Right;  . NECK SURGERY  2006  . right breast cancer    . SAVORY DILATION N/A 05/14/2015   Procedure: SAVORY DILATION;  Surgeon: Manya Silvas, MD;  Location: [facility] ENDOSCOPY;  Service: Endoscopy;  Laterality: N/A;  . TRABECULECTOMY Left 03/05/2016   Procedure: TRABECULECTOMY WITH [facility] AND EXPRESS SHUNT;  Surgeon: Leandrew Koyanagi, MD;  Location: [facility];  Service: Ophthalmology;  Laterality: Left;  . TUBAL LIGATION     Constitutional Exam  General appearance: Well nourished, well developed, and well hydrated. In no apparent acute distress Vitals:   06/09/17 0820  BP: (!) 143/71  Pulse: 81  Resp: 16  SpO2: 99%  Weight: 101 lb (45.8 kg)  Height: 5' 4.5" (1.638 m)   BMI Assessment: Estimated body mass index is 17.07 kg/m as calculated from the following:   Height as of this encounter: 5' 4.5" (1.638 m).   Weight as of this encounter: 101 lb (45.8 kg).  BMI interpretation table: BMI level Category Range association with higher incidence of chronic pain  <18 kg/m2 Underweight   18.5-24.9 kg/m2 Ideal body weight   25-29.9 kg/m2 Overweight Increased incidence by 20%  30-34.9 kg/m2 Obese (Class I) Increased incidence by 68%  35-39.9 kg/m2 Severe obesity (Class II) Increased incidence by 136%  >40 kg/m2 Extreme obesity (Class III) Increased incidence by 254%   BMI Readings from Last 4 Encounters:  06/09/17 17.07 kg/m  05/11/17 17.34 kg/m  05/06/17 17.16 kg/m  03/19/17 17.16 kg/m   Wt Readings from Last 4 Encounters:  06/09/17 101 lb (45.8 kg)  05/11/17 101 lb (45.8 kg)  05/06/17 100 lb (45.4 kg)  03/19/17 100  lb (45.4 kg)  Psych/Mental status: Alert, oriented x 3 (person, place, & time)       Eyes: PERLA Respiratory: No evidence of acute respiratory distress  Cervical Spine Exam  Inspection: No masses, redness, or swelling Alignment: Symmetrical Functional ROM: Improved after treatment      Stability: No instability detected Muscle strength & Tone: Functionally intact Sensory: Unimpaired Palpation: No palpable anomalies              Upper Extremity (UE) Exam    Side: Right upper extremity  Side: Left upper extremity  Inspection: No masses, redness, swelling, or asymmetry. No contractures  Inspection: No masses, redness, swelling, or asymmetry. No contractures  Functional ROM: Unrestricted ROM          Functional ROM: Unrestricted ROM          Muscle strength & Tone: Functionally intact  Muscle strength & Tone: Functionally intact  Sensory: Unimpaired  Sensory: Unimpaired  Palpation: No palpable anomalies              Palpation: No palpable anomalies              Specialized Test(s): Deferred         Specialized Test(s): Deferred  Thoracic Spine Exam  Inspection: No masses, redness, or swelling Alignment: Symmetrical Functional ROM: Unrestricted ROM Stability: No instability detected Sensory: Unimpaired Muscle strength & Tone: No palpable anomalies  Lumbar Spine Exam  Inspection: No masses, redness, or swelling Alignment: Symmetrical Functional ROM: Unrestricted ROM      Stability: No instability detected Muscle strength & Tone: Functionally intact Sensory: Unimpaired Palpation: No palpable anomalies       Provocative Tests: Lumbar Hyperextension and rotation test: evaluation deferred today       Patrick's Maneuver: evaluation deferred today                    Gait & Posture Assessment  Ambulation: Unassisted Gait: Relatively normal for age and body habitus Posture: WNL   Lower Extremity Exam    Side: Right lower extremity  Side: Left lower extremity  Inspection:  No masses, redness, swelling, or asymmetry. No contractures  Inspection: No masses, redness, swelling, or asymmetry. No contractures  Functional ROM: Unrestricted ROM          Functional ROM: Unrestricted ROM          Muscle strength & Tone: Functionally intact  Muscle strength & Tone: Foot drop  Sensory: Unimpaired  Sensory: Hypoalgesia (Decreased sensitivity to painful stimuli)  Palpation: No palpable anomalies  Palpation: No palpable anomalies   Assessment  Primary Diagnosis & Pertinent Problem List: The primary encounter diagnosis was Chronic lumbar radiculopathy (Bilateral). 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  Status Diagnosis  Controlled Controlled Controlled 1. Chronic lumbar radiculopathy (Bilateral)   2. Chronic Lumbar central spinal stenosis   3. Numbness and tingling of lower extremity (Bilateral) (R>L)   4. DDD (degenerative disc disease), lumbar   5. Chronic shoulder pain (Location of Primary Source of Pain) (Bilateral) (L>R)   6. Chronic neck pain (Location of Secondary source of pain) (Bilateral) (L>R)   7. 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 Cervical radiculitis   9. 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HCC)   10. Neurogenic pain   11. 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 gabapentin (NEURONTIN) 100 MG capsule    Sig: Take 1-3 capsules (100-300 mg total) by mouth at bedtime.    Dispense:  90 capsule    Refill:  0    Do not place this medication, or any other prescription from our practice, on "Automatic Refill". Patient may have prescription filled one day early if pharmacy is closed on scheduled refill date.   New Prescriptions   GABAPENTIN (NEURONTIN) 100 MG CAPSULE    Take 1-3 capsules (100-300 mg  total) by mouth at bedtime.   Medications administered today: Ms. Ozer had no medications administered during this visit. Lab-work, procedure(s), and/or referral(s): Orders Placed This Encounter  Procedures  . Lumbar Epidural Injection  . Lumbar Transforaminal Epidural  . Cervical Epidural Injection  . Ambulatory referral to Neurosurgery   Imaging and/or referral(s): AMB REFERRAL TO NEUROSURGERY  Interventional therapies: Planned, scheduled, and/or pending:   Diagnostic left L4-5 interlaminar lumbar epidural steroid injection + left L5-S1 transforaminal epidural steroid injection under fluoroscopic guidance and IV sedation    Considering:   Diagnostic left cervical epidural steroid injection under fluoroscopic guidance  Diagnostic left L4-5 interlaminar lumbar epidural steroid injection  Diagnostic left L5-S1 transforaminal epidural steroid injection  Diagnostic bilateral lumbar facet block  Possible bilateral lumbar facet RFA    Palliative PRN treatment(s):   Palliative left cervical epidural steroid injection under fluoroscopic guidance    Provider-requested follow-up:  Return for procedure (w/ sedation):, by MD.  Future Appointments Date Time Provider [place]  06/10/2017 9:30 AM Milinda Pointer, MD [facility] None  06/15/2017 2:10 PM Gae Dry, MD [facility] None  07/02/2017 12:20 PM [facility] [place]  07/20/2017 1:30 PM Byrnett, Forest Gleason, MD [facility] None  02/10/2018 1:30 PM Dohmeier, Asencion Partridge, MD [facility] None   Primary Care Physician: Idelle Crouch, MD Location: [facility] Note by: Kathlen Brunswick. Dossie Arbour, M.D, DABA, DABAPM, DABPM, DABIPP, FIPP Date: 06/09/2017; Time: 9:47 AM  Patient instructions provided during this appointment: Patient Instructions   ____________________________________________________________________________________________  Initial Gabapentin Titration  Medication used: Gabapentin (Generic Name) or Neurontin (Brand Name) 100 mg tablets/capsules  Reasons to stop increasing the dose:  Reason 1: You get good relief of symptoms, in which case there is no need to increase the daily dose any further.    Reason 2: You develop some side effects, such as sleeping all of the time, difficulty concentrating, or becoming disoriented, in which case you need to go down on the dose, to the prior level, where you were not experiencing any side effects. Stay on that dose longer, to allow more time for your body to get use it, before attempting to increase it again.   Steps: Step 1: Start by taking 1 (one) tablet at bedtime x 7 (seven) days.  Step 2: After being on 1 (one) tablet for 7 (seven) days, then increase it to 2 (two) tablets at bedtime for another 7 (seven) days.  Step 3: Next, after being on 2 (two) tablets at bedtime for 7 (seven) days, then increase it to 3 (three) tablets at bedtime, and stay on that dose until you see your doctor.  Reasons to stop increasing the dose: Reason 1: You get good relief of symptoms, in which case there is no need to increase the daily dose any  further.  Reason 2: You develop some side effects, such as sleeping all of the time, difficulty concentrating, or becoming disoriented, in which case you need to go down on the dose, to the prior level, where you were not experiencing any side effects. Stay on that dose longer, to allow more time for your body to get use it, before attempting to increase it again.  Endpoint: Once you have reached the maximum dose you can tolerate without side-effects, contact your physician so as to evaluate the results of the regimen.   Questions: Feel free to contact us for any questions or problems at [phone] ____________________________________________________________________________________________ ____________________________________________________________________________________________  Preparing for Procedure with Sedation Instructions: .  Oral Intake: Do not eat or drink anything for at least 8 hours prior to your procedure. . Transportation: Public transportation is not allowed. Bring an adult driver. The driver must be physically present in our waiting room before any procedure can be started. Marland Kitchen Physical Assistance: Bring an adult physically capable of assisting you, in the event you need help. This adult should keep you company at home for at least 6 hours after the procedure. . Blood Pressure Medicine: Take your blood pressure medicine with a sip of water the morning of the procedure. . Blood thinners:  . Diabetics on insulin: Notify the staff so that you can be scheduled 1st case in the morning. If your diabetes requires high dose insulin, take only  of your normal insulin dose the morning of the procedure and notify the staff that you have done so. . Preventing infections: Shower with an antibacterial soap the morning of your procedure. . Build-up your immune system: Take 1000 mg of Vitamin C with every meal (3 times a day) the day prior to your procedure. Marland Kitchen Antibiotics: Inform the staff  if you have a condition or reason that requires you to take antibiotics before dental procedures. . Pregnancy: If you are pregnant, call and cancel the procedure. . Sickness: If you have a cold, fever, or any active infections, call and cancel the procedure. . Arrival: You must be in the facility at least 30 minutes prior to your scheduled procedure. . Children: Do not bring children with you. . Dress appropriately: Bring dark clothing that you would not mind if they get stained. . Valuables: Do not bring any jewelry or valuables. Procedure appointments are reserved for interventional treatments only. Marland Kitchen No Prescription Refills. . No medication changes will be discussed during procedure appointments. . No disability issues will be discussed. ____________________________________________________________________________________________  GENERAL RISKS AND COMPLICATIONS  What are the risk, side effects and possible complications? Generally speaking, most procedures are safe.  However, with any procedure there are risks, side effects, and the possibility of complications.  The risks and complications are dependent upon the sites that are lesioned, or the type of nerve block to be performed.  The closer the procedure is to the spine, the more serious the risks are.  Great care is taken when placing the radio frequency needles, block needles or lesioning probes, but sometimes complications can occur. 1. Infection: Any time there is an injection through the skin, there is a risk of infection.  This is why sterile conditions are used for these blocks.  There are four possible types of infection. 1. Localized skin infection. 2. Central Nervous System Infection-This can be in the form of Meningitis, which can be deadly. 3. Epidural Infections-This can be in the form of an epidural abscess, which can cause pressure inside of the spine, causing compression of the spinal cord with subsequent paralysis. This  would require an emergency surgery to decompress, and there are no guarantees that the patient would recover from the paralysis. 4. Discitis-This is an infection of the intervertebral discs.  It occurs in about 1% of discography procedures.  It is difficult to treat and it may lead to surgery.        2. Pain: the needles have to go through skin and soft tissues, will cause soreness.       3. Damage to internal structures:  The nerves to be lesioned may be near blood vessels or    other nerves which can be potentially damaged.  4. Bleeding: Bleeding is more common if the patient is taking blood thinners such as  aspirin, Coumadin, Ticiid, Plavix, etc., or if he/she have some genetic predisposition  such as hemophilia. Bleeding into the spinal canal can cause compression of the spinal  cord with subsequent paralysis.  This would require an emergency surgery to  decompress and there are no guarantees that the patient would recover from the  paralysis.       5. Pneumothorax:  Puncturing of a lung is a possibility, every time a needle is introduced in  the area of the chest or upper back.  Pneumothorax refers to free air around the  collapsed lung(s), inside of the thoracic cavity (chest cavity).  Another two possible  complications related to a similar event would include: Hemothorax and Chylothorax.   These are variations of the Pneumothorax, where instead of air around the collapsed  lung(s), you may have blood or chyle, respectively.       6. Spinal headaches: They may occur with any procedures in the area of the spine.       7. Persistent CSF (Cerebro-Spinal Fluid) leakage: This is a rare problem, but may occur  with prolonged intrathecal or epidural catheters either due to the formation of a fistulous  track or a dural tear.       8. Nerve damage: By working so close to the spinal cord, there is always a possibility of  nerve damage, which could be as serious as a permanent spinal cord injury with   paralysis.       9. Death:  Although rare, severe deadly allergic reactions known as "Anaphylactic  reaction" can occur to any of the medications used.      10. Worsening of the symptoms:  We can always make thing worse.  What are the chances of something like this happening? Chances of any of this occuring are extremely low.  By statistics, you have more of a chance of getting killed in a motor vehicle accident: while driving to the hospital than any of the above occurring .  Nevertheless, you should be aware that they are possibilities.  In general, it is similar to taking a shower.  Everybody knows that you can slip, hit your head and get killed.  Does that mean that you should not shower again?  Nevertheless always keep in mind that statistics do not mean anything if you happen to be on the wrong side of them.  Even if a procedure has a 1 (one) in a 1,000,000 (million) chance of going wrong, it you happen to be that one..Also, keep in mind that by statistics, you have more of a chance of having something go wrong when taking medications.  Who should not have this procedure? If you are on a blood thinning medication (e.g. Coumadin, Plavix, see list of "Blood Thinners"), or if you have an active infection going on, you should not have the procedure.  If you are taking any blood thinners, please inform your physician.  How should I prepare for this procedure?  Do not eat or drink anything at least six hours prior to the procedure.  Bring a driver with you .  It cannot be a taxi.  Come accompanied by an adult that can drive you back, and that is strong enough to help you if your legs get weak or numb from the local anesthetic.  Take all of your medicines the morning of the procedure with just enough water to  swallow them.  If you have diabetes, make sure that you are scheduled to have your procedure done first thing in the morning, whenever possible.  If you have diabetes, take only half of  your insulin dose and notify our nurse that you have done so as soon as you arrive at the clinic.  If you are diabetic, but only take blood sugar pills (oral hypoglycemic), then do not take them on the morning of your procedure.  You may take them after you have had the procedure.  Do not take aspirin or any aspirin-containing medications, at least eleven (11) days prior to the procedure.  They may prolong bleeding.  Wear loose fitting clothing that may be easy to take off and that you would not mind if it got stained with Betadine or blood.  Do not wear any jewelry or perfume  Remove any nail coloring.  It will interfere with some of our monitoring equipment.  NOTE: Remember that this is not meant to be interpreted as a complete list of all possible complications.  Unforeseen problems may occur.  BLOOD THINNERS The following drugs contain aspirin or other products, which can cause increased bleeding during surgery and should not be taken for 2 weeks prior to and 1 week after surgery.  If you should need take something for relief of minor pain, you may take acetaminophen which is found in Tylenol,m Datril, Anacin-3 and Panadol. It is not blood thinner. The products listed below are.  Do not take any of the products listed below in addition to any listed on your instruction sheet.  A.P.C or A.P.C with Codeine Codeine Phosphate Capsules #3 Ibuprofen Ridaura  ABC compound Congesprin Imuran rimadil  Advil Cope Indocin Robaxisal  Alka-Seltzer Effervescent Pain Reliever and Antacid Coricidin or Coricidin-D  Indomethacin Rufen  Alka-Seltzer plus Cold Medicine Cosprin Ketoprofen S-A-C Tablets  Anacin Analgesic Tablets or Capsules Coumadin Korlgesic Salflex  Anacin Extra Strength Analgesic tablets or capsules CP-2 Tablets Lanoril Salicylate  Anaprox Cuprimine Capsules Levenox Salocol  Anexsia-D Dalteparin Magan Salsalate  Anodynos Darvon compound Magnesium Salicylate Sine-off  Ansaid Dasin  Capsules Magsal Sodium Salicylate  Anturane Depen Capsules Marnal Soma  APF Arthritis pain formula Dewitt's Pills Measurin Stanback  Argesic Dia-Gesic Meclofenamic Sulfinpyrazone  Arthritis Bayer Timed Release Aspirin Diclofenac Meclomen Sulindac  Arthritis pain formula Anacin Dicumarol Medipren Supac  Analgesic (Safety coated) Arthralgen Diffunasal Mefanamic Suprofen  Arthritis Strength Bufferin Dihydrocodeine Mepro Compound Suprol  Arthropan liquid Dopirydamole Methcarbomol with Aspirin Synalgos  ASA tablets/Enseals Disalcid Micrainin Tagament  Ascriptin Doan's Midol Talwin  Ascriptin A/D Dolene Mobidin Tanderil  Ascriptin Extra Strength Dolobid Moblgesic Ticlid  Ascriptin with Codeine Doloprin or Doloprin with Codeine Momentum Tolectin  Asperbuf Duoprin Mono-gesic Trendar  Aspergum Duradyne Motrin or Motrin IB Triminicin  Aspirin plain, buffered or enteric coated Durasal Myochrisine Trigesic  Aspirin Suppositories Easprin Nalfon Trillsate  Aspirin with Codeine Ecotrin Regular or Extra Strength Naprosyn Uracel  Atromid-S Efficin Naproxen Ursinus  Auranofin Capsules Elmiron Neocylate Vanquish  Axotal Emagrin Norgesic Verin  Azathioprine Empirin or Empirin with Codeine Normiflo Vitamin E  Azolid Emprazil Nuprin Voltaren  Bayer Aspirin plain, buffered or children's or timed BC Tablets or powders Encaprin Orgaran Warfarin Sodium  Buff-a-Comp Enoxaparin Orudis Zorpin  Buff-a-Comp with Codeine Equegesic Os-Cal-Gesic   Buffaprin Excedrin plain, buffered or Extra Strength Oxalid   Bufferin Arthritis Strength Feldene Oxphenbutazone   Bufferin plain or Extra Strength Feldene Capsules Oxycodone with Aspirin   Bufferin with Codeine Fenoprofen Fenoprofen Pabalate or Pabalate-SF  Buffets II Flogesic Panagesic   Buffinol plain or Extra Strength Florinal or Florinal with Codeine Panwarfarin   Buf-Tabs Flurbiprofen Penicillamine   Butalbital Compound Four-way cold tablets Penicillin    Butazolidin Fragmin Pepto-Bismol   Carbenicillin Geminisyn Percodan   Carna Arthritis Reliever Geopen Persantine   Carprofen Gold's salt Persistin   Chloramphenicol Goody's Phenylbutazone   Chloromycetin Haltrain Piroxlcam   Clmetidine heparin Plaquenil   Cllnoril Hyco-pap Ponstel   Clofibrate Hydroxy chloroquine Propoxyphen         Before stopping any of these medications, be sure to consult the physician who ordered them.  Some, such as Coumadin (Warfarin) are ordered to prevent or treat serious conditions such as "deep thrombosis", "pumonary embolisms", and other heart problems.  The amount of time that you may need off of the medication may also vary with the medication and the reason for which you were taking it.  If you are taking any of these medications, please make sure you notify your pain physician before you undergo any procedures.          Epidural Steroid Injection An epidural steroid injection is a shot of steroid medicine and numbing medicine that is given into the space between the spinal cord and the bones in your back (epidural space). The shot helps relieve pain caused by an irritated or swollen nerve root. The amount of pain relief you get from the injection depends on what is causing the nerve to be swollen and irritated, and how long your pain lasts. You are more likely to benefit from this injection if your pain is strong and comes on suddenly rather than if you have had pain for a long time. Tell a health care provider about:  Any allergies you have.  All medicines you are taking, including vitamins, herbs, eye drops, creams, and over-the-counter medicines.  Any problems you or family members have had with anesthetic medicines.  Any blood disorders you have.  Any surgeries you have had.  Any medical conditions you have.  Whether you are pregnant or may be pregnant. What are the risks? Generally, this is a safe procedure. However, problems may occur,  including:  Headache.  Bleeding.  Infection.  Allergic reaction to medicines.  Damage to your nerves.  What happens before the procedure? Staying hydrated Follow instructions from your health care provider about hydration, which may include:  Up to 2 hours before the procedure - you may continue to drink clear liquids, such as water, clear fruit juice, black coffee, and plain tea.  Eating and drinking restrictions Follow instructions from your health care provider about eating and drinking, which may include:  8 hours before the procedure - stop eating heavy meals or foods such as meat, fried foods, or fatty foods.  6 hours before the procedure - stop eating light meals or foods, such as toast or cereal.  6 hours before the procedure - stop drinking milk or drinks that contain milk.  2 hours before the procedure - stop drinking clear liquids.  Medicine  You may be given medicines to lower anxiety.  Ask your health care provider about: ? Changing or stopping your regular medicines. This is especially important if you are taking diabetes medicines or blood thinners. ? Taking medicines such as aspirin and ibuprofen. These medicines can thin your blood. Do not take these medicines before your procedure if your health care provider instructs you not to. General instructions  Plan to have someone take you home from the hospital  or clinic. What happens during the procedure?  You may receive a medicine to help you relax (sedative).  You will be asked to lie on your abdomen.  The injection site will be cleaned.  A numbing medicine (local anesthetic) will be used to numb the injection site.  A needle will be inserted through your skin into the epidural space. You may feel some discomfort when this happens. An X-ray machine will be used to make sure the needle is put as close as possible to the affected nerve.  A steroid medicine and a local anesthetic will be injected into  the epidural space.  The needle will be removed.  A bandage (dressing) will be put over the injection site. What happens after the procedure?  Your blood pressure, heart rate, breathing rate, and blood oxygen level will be monitored until the medicines you were given have worn off.  Your arm or leg may feel weak or numb for a few hours.  The injection site may feel sore.  Do not drive for 24 hours if you received a sedative. This information is not intended to replace advice given to you by your health care provider. Make sure you discuss any questions you have with your health care provider. Document Released: 03/16/2008 Document Revised: 05/21/2016 Document Reviewed: 03/25/2016 [employer] Interactive Patient Education  2017 [employer].

## 2017-06-09 ENCOUNTER — Ambulatory Visit: Payer: Medicare Other | Attending: Pain Medicine | Admitting: Pain Medicine

## 2017-06-09 ENCOUNTER — Encounter: Payer: Self-pay | Admitting: Pain Medicine

## 2017-06-09 VITALS — BP 143/71 | HR 81 | Resp 16 | Ht 64.5 in | Wt 101.0 lb

## 2017-06-09 DIAGNOSIS — M25511 Pain in right shoulder: Secondary | ICD-10-CM | POA: Insufficient documentation

## 2017-06-09 DIAGNOSIS — Z853 Personal history of malignant neoplasm of breast: Secondary | ICD-10-CM | POA: Insufficient documentation

## 2017-06-09 DIAGNOSIS — R202 Paresthesia of skin: Secondary | ICD-10-CM

## 2017-06-09 DIAGNOSIS — M4682 Other specified inflammatory spondylopathies, cervical region: Secondary | ICD-10-CM | POA: Insufficient documentation

## 2017-06-09 DIAGNOSIS — M792 Neuralgia and neuritis, unspecified: Secondary | ICD-10-CM | POA: Insufficient documentation

## 2017-06-09 DIAGNOSIS — M5412 Radiculopathy, cervical region: Secondary | ICD-10-CM | POA: Insufficient documentation

## 2017-06-09 DIAGNOSIS — M25512 Pain in left shoulder: Secondary | ICD-10-CM | POA: Insufficient documentation

## 2017-06-09 DIAGNOSIS — M549 Dorsalgia, unspecified: Secondary | ICD-10-CM | POA: Diagnosis present

## 2017-06-09 DIAGNOSIS — M5416 Radiculopathy, lumbar region: Secondary | ICD-10-CM | POA: Diagnosis not present

## 2017-06-09 DIAGNOSIS — M5136 Other intervertebral disc degeneration, lumbar region: Secondary | ICD-10-CM | POA: Insufficient documentation

## 2017-06-09 DIAGNOSIS — M5030 Other cervical disc degeneration, unspecified cervical region: Secondary | ICD-10-CM | POA: Insufficient documentation

## 2017-06-09 DIAGNOSIS — M6281 Muscle weakness (generalized): Secondary | ICD-10-CM | POA: Insufficient documentation

## 2017-06-09 DIAGNOSIS — M48061 Spinal stenosis, lumbar region without neurogenic claudication: Secondary | ICD-10-CM | POA: Insufficient documentation

## 2017-06-09 DIAGNOSIS — M4692 Unspecified inflammatory spondylopathy, cervical region: Secondary | ICD-10-CM

## 2017-06-09 DIAGNOSIS — G8929 Other chronic pain: Secondary | ICD-10-CM

## 2017-06-09 DIAGNOSIS — M4802 Spinal stenosis, cervical region: Secondary | ICD-10-CM

## 2017-06-09 DIAGNOSIS — R109 Unspecified abdominal pain: Secondary | ICD-10-CM | POA: Diagnosis not present

## 2017-06-09 DIAGNOSIS — M542 Cervicalgia: Secondary | ICD-10-CM | POA: Diagnosis present

## 2017-06-09 DIAGNOSIS — M4722 Other spondylosis with radiculopathy, cervical region: Secondary | ICD-10-CM

## 2017-06-09 DIAGNOSIS — R200 Anesthesia of skin: Secondary | ICD-10-CM | POA: Diagnosis not present

## 2017-06-09 DIAGNOSIS — J449 Chronic obstructive pulmonary disease, unspecified: Secondary | ICD-10-CM | POA: Insufficient documentation

## 2017-06-09 DIAGNOSIS — M48062 Spinal stenosis, lumbar region with neurogenic claudication: Secondary | ICD-10-CM

## 2017-06-09 DIAGNOSIS — G894 Chronic pain syndrome: Secondary | ICD-10-CM | POA: Diagnosis not present

## 2017-06-09 DIAGNOSIS — M21372 Foot drop, left foot: Secondary | ICD-10-CM

## 2017-06-09 MED ORDER — GABAPENTIN 100 MG PO CAPS: 100.0000 mg | ORAL_CAPSULE | Freq: Every day | ORAL | 0 refills | Status: DC

## 2017-06-09 NOTE — Patient Instructions (Addendum)
____________________________________________________________________________________________  Initial Gabapentin Titration  Medication used: Gabapentin (Generic Name) or Neurontin (Brand Name) 100 mg tablets/capsules  Reasons to stop increasing the dose:  Reason 1: You get good relief of symptoms, in which case there is no need to increase the daily dose any further.    Reason 2: You develop some side effects, such as sleeping all of the time, difficulty concentrating, or becoming disoriented, in which case you need to go down on the dose, to the prior level, where you were not experiencing any side effects. Stay on that dose longer, to allow more time for your body to get use it, before attempting to increase it again.   Steps: Step 1: Start by taking 1 (one) tablet at bedtime x 7 (seven) days.  Step 2: After being on 1 (one) tablet for 7 (seven) days, then increase it to 2 (two) tablets at bedtime for another 7 (seven) days.  Step 3: Next, after being on 2 (two) tablets at bedtime for 7 (seven) days, then increase it to 3 (three) tablets at bedtime, and stay on that dose until you see your doctor.  Reasons to stop increasing the dose: Reason 1: You get good relief of symptoms, in which case there is no need to increase the daily dose any further.  Reason 2: You develop some side effects, such as sleeping all of the time, difficulty concentrating, or becoming disoriented, in which case you need to go down on the dose, to the prior level, where you were not experiencing any side effects. Stay on that dose longer, to allow more time for your body to get use it, before attempting to increase it again.  Endpoint: Once you have reached the maximum dose you can tolerate without side-effects, contact your physician so as to evaluate the results of the regimen.   Questions: Feel free to contact us for any questions or problems at [phone]  [phone] ____________________________________________________________________________________________ ____________________________________________________________________________________________  Preparing for Procedure with Sedation Instructions: . Oral Intake: Do not eat or drink anything for at least 8 hours prior to your procedure. . Transportation: Public transportation is not allowed. Bring an adult driver. The driver must be physically present in our waiting room before any procedure can be started. Marland Kitchen Physical Assistance: Bring an adult physically capable of assisting you, in the event you need help. This adult should keep you company at home for at least 6 hours after the procedure. . Blood Pressure Medicine: Take your blood pressure medicine with a sip of water the morning of the procedure. . Blood thinners:  . Diabetics on insulin: Notify the staff so that you can be scheduled 1st case in the morning. If your diabetes requires high dose insulin, take only  of your normal insulin dose the morning of the procedure and notify the staff that you have done so. . Preventing infections: Shower with an antibacterial soap the morning of your procedure. . Build-up your immune system: Take 1000 mg of Vitamin C with every meal (3 times a day) the day prior to your procedure. Marland Kitchen Antibiotics: Inform the staff if you have a condition or reason that requires you to take antibiotics before dental procedures. . Pregnancy: If you are pregnant, call and cancel the procedure. . Sickness: If you have a cold, fever, or any active infections, call and cancel the procedure. . Arrival: You must be in the facility at least 30 minutes prior to your scheduled procedure. . Children: Do not bring children with you. Marland Kitchen  Dress appropriately: Bring dark clothing that you would not mind if they get stained. . Valuables: Do not bring any jewelry or valuables. Procedure appointments are reserved for interventional  treatments only. Marland Kitchen No Prescription Refills. . No medication changes will be discussed during procedure appointments. . No disability issues will be discussed. ____________________________________________________________________________________________  GENERAL RISKS AND COMPLICATIONS  What are the risk, side effects and possible complications? Generally speaking, most procedures are safe.  However, with any procedure there are risks, side effects, and the possibility of complications.  The risks and complications are dependent upon the sites that are lesioned, or the type of nerve block to be performed.  The closer the procedure is to the spine, the more serious the risks are.  Great care is taken when placing the radio frequency needles, block needles or lesioning probes, but sometimes complications can occur. 1. Infection: Any time there is an injection through the skin, there is a risk of infection.  This is why sterile conditions are used for these blocks.  There are four possible types of infection. 1. Localized skin infection. 2. Central Nervous System Infection-This can be in the form of Meningitis, which can be deadly. 3. Epidural Infections-This can be in the form of an epidural abscess, which can cause pressure inside of the spine, causing compression of the spinal cord with subsequent paralysis. This would require an emergency surgery to decompress, and there are no guarantees that the patient would recover from the paralysis. 4. Discitis-This is an infection of the intervertebral discs.  It occurs in about 1% of discography procedures.  It is difficult to treat and it may lead to surgery.        2. Pain: the needles have to go through skin and soft tissues, will cause soreness.       3. Damage to internal structures:  The nerves to be lesioned may be near blood vessels or    other nerves which can be potentially damaged.       4. Bleeding: Bleeding is more common if the patient is  taking blood thinners such as  aspirin, Coumadin, Ticiid, Plavix, etc., or if he/she have some genetic predisposition  such as hemophilia. Bleeding into the spinal canal can cause compression of the spinal  cord with subsequent paralysis.  This would require an emergency surgery to  decompress and there are no guarantees that the patient would recover from the  paralysis.       5. Pneumothorax:  Puncturing of a lung is a possibility, every time a needle is introduced in  the area of the chest or upper back.  Pneumothorax refers to free air around the  collapsed lung(s), inside of the thoracic cavity (chest cavity).  Another two possible  complications related to a similar event would include: Hemothorax and Chylothorax.   These are variations of the Pneumothorax, where instead of air around the collapsed  lung(s), you may have blood or chyle, respectively.       6. Spinal headaches: They may occur with any procedures in the area of the spine.       7. Persistent CSF (Cerebro-Spinal Fluid) leakage: This is a rare problem, but may occur  with prolonged intrathecal or epidural catheters either due to the formation of a fistulous  track or a dural tear.       8. Nerve damage: By working so close to the spinal cord, there is always a possibility of  nerve damage, which could be as  serious as a permanent spinal cord injury with  paralysis.       9. Death:  Although rare, severe deadly allergic reactions known as "Anaphylactic  reaction" can occur to any of the medications used.      10. Worsening of the symptoms:  We can always make thing worse.  What are the chances of something like this happening? Chances of any of this occuring are extremely low.  By statistics, you have more of a chance of getting killed in a motor vehicle accident: while driving to the hospital than any of the above occurring .  Nevertheless, you should be aware that they are possibilities.  In general, it is similar to taking a shower.   Everybody knows that you can slip, hit your head and get killed.  Does that mean that you should not shower again?  Nevertheless always keep in mind that statistics do not mean anything if you happen to be on the wrong side of them.  Even if a procedure has a 1 (one) in a 1,000,000 (million) chance of going wrong, it you happen to be that one..Also, keep in mind that by statistics, you have more of a chance of having something go wrong when taking medications.  Who should not have this procedure? If you are on a blood thinning medication (e.g. Coumadin, Plavix, see list of "Blood Thinners"), or if you have an active infection going on, you should not have the procedure.  If you are taking any blood thinners, please inform your physician.  How should I prepare for this procedure?  Do not eat or drink anything at least six hours prior to the procedure.  Bring a driver with you .  It cannot be a taxi.  Come accompanied by an adult that can drive you back, and that is strong enough to help you if your legs get weak or numb from the local anesthetic.  Take all of your medicines the morning of the procedure with just enough water to swallow them.  If you have diabetes, make sure that you are scheduled to have your procedure done first thing in the morning, whenever possible.  If you have diabetes, take only half of your insulin dose and notify our nurse that you have done so as soon as you arrive at the clinic.  If you are diabetic, but only take blood sugar pills (oral hypoglycemic), then do not take them on the morning of your procedure.  You may take them after you have had the procedure.  Do not take aspirin or any aspirin-containing medications, at least eleven (11) days prior to the procedure.  They may prolong bleeding.  Wear loose fitting clothing that may be easy to take off and that you would not mind if it got stained with Betadine or blood.  Do not wear any jewelry or perfume  Remove  any nail coloring.  It will interfere with some of our monitoring equipment.  NOTE: Remember that this is not meant to be interpreted as a complete list of all possible complications.  Unforeseen problems may occur.  BLOOD THINNERS The following drugs contain aspirin or other products, which can cause increased bleeding during surgery and should not be taken for 2 weeks prior to and 1 week after surgery.  If you should need take something for relief of minor pain, you may take acetaminophen which is found in Tylenol,m Datril, Anacin-3 and Panadol. It is not blood thinner. The products listed below are.  Do not take any of the products listed below in addition to any listed on your instruction sheet.  A.P.C or A.P.C with Codeine Codeine Phosphate Capsules #3 Ibuprofen Ridaura  ABC compound Congesprin Imuran rimadil  Advil Cope Indocin Robaxisal  Alka-Seltzer Effervescent Pain Reliever and Antacid Coricidin or Coricidin-D  Indomethacin Rufen  Alka-Seltzer plus Cold Medicine Cosprin Ketoprofen S-A-C Tablets  Anacin Analgesic Tablets or Capsules Coumadin Korlgesic Salflex  Anacin Extra Strength Analgesic tablets or capsules CP-2 Tablets Lanoril Salicylate  Anaprox Cuprimine Capsules Levenox Salocol  Anexsia-D Dalteparin Magan Salsalate  Anodynos Darvon compound Magnesium Salicylate Sine-off  Ansaid Dasin Capsules Magsal Sodium Salicylate  Anturane Depen Capsules Marnal Soma  APF Arthritis pain formula Dewitt's Pills Measurin Stanback  Argesic Dia-Gesic Meclofenamic Sulfinpyrazone  Arthritis Bayer Timed Release Aspirin Diclofenac Meclomen Sulindac  Arthritis pain formula Anacin Dicumarol Medipren Supac  Analgesic (Safety coated) Arthralgen Diffunasal Mefanamic Suprofen  Arthritis Strength Bufferin Dihydrocodeine Mepro Compound Suprol  Arthropan liquid Dopirydamole Methcarbomol with Aspirin Synalgos  ASA tablets/Enseals Disalcid Micrainin Tagament  Ascriptin Doan's Midol Talwin  Ascriptin A/D  Dolene Mobidin Tanderil  Ascriptin Extra Strength Dolobid Moblgesic Ticlid  Ascriptin with Codeine Doloprin or Doloprin with Codeine Momentum Tolectin  Asperbuf Duoprin Mono-gesic Trendar  Aspergum Duradyne Motrin or Motrin IB Triminicin  Aspirin plain, buffered or enteric coated Durasal Myochrisine Trigesic  Aspirin Suppositories Easprin Nalfon Trillsate  Aspirin with Codeine Ecotrin Regular or Extra Strength Naprosyn Uracel  Atromid-S Efficin Naproxen Ursinus  Auranofin Capsules Elmiron Neocylate Vanquish  Axotal Emagrin Norgesic Verin  Azathioprine Empirin or Empirin with Codeine Normiflo Vitamin E  Azolid Emprazil Nuprin Voltaren  Bayer Aspirin plain, buffered or children's or timed BC Tablets or powders Encaprin Orgaran Warfarin Sodium  Buff-a-Comp Enoxaparin Orudis Zorpin  Buff-a-Comp with Codeine Equegesic Os-Cal-Gesic   Buffaprin Excedrin plain, buffered or Extra Strength Oxalid   Bufferin Arthritis Strength Feldene Oxphenbutazone   Bufferin plain or Extra Strength Feldene Capsules Oxycodone with Aspirin   Bufferin with Codeine Fenoprofen Fenoprofen Pabalate or Pabalate-SF   Buffets II Flogesic Panagesic   Buffinol plain or Extra Strength Florinal or Florinal with Codeine Panwarfarin   Buf-Tabs Flurbiprofen Penicillamine   Butalbital Compound Four-way cold tablets Penicillin   Butazolidin Fragmin Pepto-Bismol   Carbenicillin Geminisyn Percodan   Carna Arthritis Reliever Geopen Persantine   Carprofen Gold's salt Persistin   Chloramphenicol Goody's Phenylbutazone   Chloromycetin Haltrain Piroxlcam   Clmetidine heparin Plaquenil   Cllnoril Hyco-pap Ponstel   Clofibrate Hydroxy chloroquine Propoxyphen         Before stopping any of these medications, be sure to consult the physician who ordered them.  Some, such as Coumadin (Warfarin) are ordered to prevent or treat serious conditions such as "deep thrombosis", "pumonary embolisms", and other heart problems.  The amount of time  that you may need off of the medication may also vary with the medication and the reason for which you were taking it.  If you are taking any of these medications, please make sure you notify your pain physician before you undergo any procedures.          Epidural Steroid Injection An epidural steroid injection is a shot of steroid medicine and numbing medicine that is given into the space between the spinal cord and the bones in your back (epidural space). The shot helps relieve pain caused by an irritated or swollen nerve root. The amount of pain relief you get from the injection depends on what is causing the nerve to be swollen and  irritated, and how long your pain lasts. You are more likely to benefit from this injection if your pain is strong and comes on suddenly rather than if you have had pain for a long time. Tell a health care provider about:  Any allergies you have.  All medicines you are taking, including vitamins, herbs, eye drops, creams, and over-the-counter medicines.  Any problems you or family members have had with anesthetic medicines.  Any blood disorders you have.  Any surgeries you have had.  Any medical conditions you have.  Whether you are pregnant or may be pregnant. What are the risks? Generally, this is a safe procedure. However, problems may occur, including:  Headache.  Bleeding.  Infection.  Allergic reaction to medicines.  Damage to your nerves.  What happens before the procedure? Staying hydrated Follow instructions from your health care provider about hydration, which may include:  Up to 2 hours before the procedure - you may continue to drink clear liquids, such as water, clear fruit juice, black coffee, and plain tea.  Eating and drinking restrictions Follow instructions from your health care provider about eating and drinking, which may include:  8 hours before the procedure - stop eating heavy meals or foods such as meat, fried  foods, or fatty foods.  6 hours before the procedure - stop eating light meals or foods, such as toast or cereal.  6 hours before the procedure - stop drinking milk or drinks that contain milk.  2 hours before the procedure - stop drinking clear liquids.  Medicine  You may be given medicines to lower anxiety.  Ask your health care provider about: ? Changing or stopping your regular medicines. This is especially important if you are taking diabetes medicines or blood thinners. ? Taking medicines such as aspirin and ibuprofen. These medicines can thin your blood. Do not take these medicines before your procedure if your health care provider instructs you not to. General instructions  Plan to have someone take you home from the hospital or clinic. What happens during the procedure?  You may receive a medicine to help you relax (sedative).  You will be asked to lie on your abdomen.  The injection site will be cleaned.  A numbing medicine (local anesthetic) will be used to numb the injection site.  A needle will be inserted through your skin into the epidural space. You may feel some discomfort when this happens. An X-ray machine will be used to make sure the needle is put as close as possible to the affected nerve.  A steroid medicine and a local anesthetic will be injected into the epidural space.  The needle will be removed.  A bandage (dressing) will be put over the injection site. What happens after the procedure?  Your blood pressure, heart rate, breathing rate, and blood oxygen level will be monitored until the medicines you were given have worn off.  Your arm or leg may feel weak or numb for a few hours.  The injection site may feel sore.  Do not drive for 24 hours if you received a sedative. This information is not intended to replace advice given to you by your health care provider. Make sure you discuss any questions you have with your health care provider. Document  Released: 03/16/2008 Document Revised: 05/21/2016 Document Reviewed: 03/25/2016 [employer] Interactive Patient Education  2017 [employer].

## 2017-06-10 ENCOUNTER — Ambulatory Visit (HOSPITAL_BASED_OUTPATIENT_CLINIC_OR_DEPARTMENT_OTHER): Payer: Medicare Other | Admitting: Pain Medicine

## 2017-06-10 ENCOUNTER — Ambulatory Visit
Admission: RE | Admit: 2017-06-10 | Discharge: 2017-06-10 | Disposition: A | Discharge status: Home / Self Care | Payer: Medicare Other | Source: Ambulatory Visit | Attending: Pain Medicine | Admitting: Pain Medicine

## 2017-06-10 ENCOUNTER — Encounter: Payer: Self-pay | Admitting: Pain Medicine

## 2017-06-10 VITALS — BP 155/65 | HR 62 | Temp 97.8°F | Resp 16 | Ht 64.0 in | Wt 102.0 lb

## 2017-06-10 DIAGNOSIS — M5416 Radiculopathy, lumbar region: Secondary | ICD-10-CM | POA: Diagnosis not present

## 2017-06-10 DIAGNOSIS — M4310 Spondylolisthesis, site unspecified: Secondary | ICD-10-CM

## 2017-06-10 DIAGNOSIS — M9983 Other biomechanical lesions of lumbar region: Secondary | ICD-10-CM | POA: Insufficient documentation

## 2017-06-10 DIAGNOSIS — M48062 Spinal stenosis, lumbar region with neurogenic claudication: Secondary | ICD-10-CM

## 2017-06-10 DIAGNOSIS — M8938 Hypertrophy of bone, other site: Secondary | ICD-10-CM | POA: Diagnosis not present

## 2017-06-10 DIAGNOSIS — M47816 Spondylosis without myelopathy or radiculopathy, lumbar region: Secondary | ICD-10-CM

## 2017-06-10 DIAGNOSIS — M4600 Spinal enthesopathy, site unspecified: Secondary | ICD-10-CM

## 2017-06-10 DIAGNOSIS — M48061 Spinal stenosis, lumbar region without neurogenic claudication: Secondary | ICD-10-CM | POA: Insufficient documentation

## 2017-06-10 DIAGNOSIS — M4316 Spondylolisthesis, lumbar region: Secondary | ICD-10-CM | POA: Diagnosis not present

## 2017-06-10 DIAGNOSIS — M47896 Other spondylosis, lumbar region: Secondary | ICD-10-CM

## 2017-06-10 DIAGNOSIS — M21372 Foot drop, left foot: Secondary | ICD-10-CM | POA: Insufficient documentation

## 2017-06-10 DIAGNOSIS — M2428 Disorder of ligament, vertebrae: Secondary | ICD-10-CM | POA: Insufficient documentation

## 2017-06-10 MED ORDER — FENTANYL CITRATE (PF) 100 MCG/2ML IJ SOLN
INTRAMUSCULAR | Status: AC
  Filled 2017-06-10: qty 2

## 2017-06-10 MED ORDER — LIDOCAINE HCL (PF) 1.5 % IJ SOLN: 20.0000 mL | Freq: Once | INTRAMUSCULAR | Status: DC

## 2017-06-10 MED ORDER — ROPIVACAINE HCL 2 MG/ML IJ SOLN
INTRAMUSCULAR | Status: AC
  Filled 2017-06-10: qty 20

## 2017-06-10 MED ORDER — MIDAZOLAM HCL 5 MG/5ML IJ SOLN
1.0000 mg | INTRAMUSCULAR | Status: DC | PRN
  Administered 2017-06-10: 1 mg via INTRAVENOUS

## 2017-06-10 MED ORDER — LIDOCAINE HCL (PF) 1 % IJ SOLN
INTRAMUSCULAR | Status: AC
  Filled 2017-06-10: qty 10

## 2017-06-10 MED ORDER — DEXAMETHASONE SODIUM PHOSPHATE 10 MG/ML IJ SOLN
INTRAMUSCULAR | Status: AC
  Filled 2017-06-10: qty 1

## 2017-06-10 MED ORDER — IOPAMIDOL (ISOVUE-M 200) INJECTION 41%
INTRAMUSCULAR | Status: AC
  Filled 2017-06-10: qty 10

## 2017-06-10 MED ORDER — MIDAZOLAM HCL 5 MG/5ML IJ SOLN
INTRAMUSCULAR | Status: AC
  Filled 2017-06-10: qty 5

## 2017-06-10 MED ORDER — SODIUM CHLORIDE 0.9% FLUSH: 2.0000 mL | Freq: Once | INTRAVENOUS | Status: DC

## 2017-06-10 MED ORDER — ROPIVACAINE HCL 2 MG/ML IJ SOLN
1.0000 mL | Freq: Once | INTRAMUSCULAR | Status: AC
  Administered 2017-06-10: 10 mL via EPIDURAL

## 2017-06-10 MED ORDER — SODIUM CHLORIDE 0.9 % IJ SOLN
INTRAMUSCULAR | Status: AC
  Filled 2017-06-10: qty 10

## 2017-06-10 MED ORDER — LACTATED RINGERS IV SOLN
1000.0000 mL | Freq: Once | INTRAVENOUS | Status: AC
  Administered 2017-06-10: 1000 mL via INTRAVENOUS

## 2017-06-10 MED ORDER — SODIUM CHLORIDE 0.9% FLUSH
1.0000 mL | Freq: Once | INTRAVENOUS | Status: AC
  Administered 2017-06-10: 2 mL

## 2017-06-10 MED ORDER — LIDOCAINE HCL (PF) 1 % IJ SOLN
5.0000 mL | Freq: Once | INTRAMUSCULAR | Status: AC
  Administered 2017-06-10: 5 mL

## 2017-06-10 MED ORDER — IOPAMIDOL (ISOVUE-M 200) INJECTION 41%
10.0000 mL | Freq: Once | INTRAMUSCULAR | Status: AC
  Administered 2017-06-10: 10 mL via EPIDURAL

## 2017-06-10 MED ORDER — DEXAMETHASONE SODIUM PHOSPHATE 10 MG/ML IJ SOLN
10.0000 mg | Freq: Once | INTRAMUSCULAR | Status: AC
  Administered 2017-06-10: 10 mg

## 2017-06-10 MED ORDER — TRIAMCINOLONE ACETONIDE 40 MG/ML IJ SUSP
40.0000 mg | Freq: Once | INTRAMUSCULAR | Status: AC
  Administered 2017-06-10: 40 mg

## 2017-06-10 MED ORDER — TRIAMCINOLONE ACETONIDE 40 MG/ML IJ SUSP
INTRAMUSCULAR | Status: AC
  Filled 2017-06-10: qty 1

## 2017-06-10 MED ORDER — ROPIVACAINE HCL 2 MG/ML IJ SOLN
2.0000 mL | Freq: Once | INTRAMUSCULAR | Status: AC
  Administered 2017-06-10: 2 mL via EPIDURAL

## 2017-06-10 MED ORDER — FENTANYL CITRATE (PF) 100 MCG/2ML IJ SOLN
25.0000 ug | INTRAMUSCULAR | Status: DC | PRN
  Administered 2017-06-10: 50 ug via INTRAVENOUS

## 2017-06-10 NOTE — Progress Notes (Signed)
Patient's Name: Elaine Clayton  MRN: [id number]  Referring Provider: Idelle Crouch, MD  DOB: 1946/08/29  PCP: Idelle Crouch, MD  DOS: 06/10/2017  Note by: Kathlen Brunswick. Dossie Arbour, MD  Service setting: Ambulatory outpatient  Location: [facility] (AMB) Pain Management Facility  Visit type: Procedure  Specialty: Interventional Pain Management  Patient type: Established   Primary Reason for Visit: Interventional Pain Management Treatment. CC: Back Pain (lower)  Procedure:  Anesthesia, Analgesia, Anxiolysis:  Procedure #1: Type: Diagnostic Inter-Laminar Epidural Steroid Injection Region: Lumbar Level: L4-5 Level. Laterality: Left-Sided Paramedial  Procedure #2: Type: Diagnostic Trans-Foraminal Epidural Steroid Injection Region: Lumbar Level: L5-S1 Paravertebral Laterality: Left-Sided Paravertebral Position: Prone  Type: Local Anesthesia with Moderate (Conscious) Sedation Local Anesthetic: Lidocaine 1% Route: Intravenous (IV) IV Access: Secured Sedation: Meaningful verbal contact was maintained at all times during the procedure  Indication(s): Analgesia and Anxiety  Indications: 1. Chronic lumbar radiculopathy (Bilateral) (L>R) (left L5)   2. Chronic Lumbar central spinal stenosis   3. Lumbar foraminal stenosis   4. Lumbar lateral recess stenosis   5. Grade 1 Anterolisthesis of L4 over L5 and L5 over S1   6. Lumbar facet hypertrophy (multilevel)   7. Lumbar Ligamentum flavum hypertrophy (HCC) (multilevel)    Pain Score: Pre-procedure: 5 /10 Post-procedure: 0-No pain/10  Pre-op Assessment:  Previous date of service: 06/09/17 Service provided: Evaluation Ms. Parmenter is a 71 y.o. (year old), female patient, seen today for interventional treatment. She  has a past surgical history that includes Colonoscopy (2004); Neck surgery (2006); Eye surgery (2013); Appendectomy; Tubal ligation; Colonoscopy (N/A, 05/14/2015); Esophagogastroduodenoscopy (N/A, 05/14/2015); Savory dilation (N/A,  05/14/2015); ERCP (N/A); Glaucoma surgery (Right, 2013); cataract (Right, 2013); Breast surgery (Left,  2013); Breast surgery (Right, November 06, 1999); Breast surgery (Left, May 07, 2007); EUS (N/A, 2011, 2012); Laparoscopic right colectomy (Right, 06/22/2015); Breast biopsy (Right, 2011); Breast excisional biopsy (Right, 2001); Breast excisional biopsy (Right, 2008); Breast excisional biopsy (Left, 10/26/2012); right breast cancer; Trabeculectomy (Left, 03/05/2016); and Cataract extraction w/PHACO (Left, 03/05/2016). Her primarily concern today is the Back Pain (lower)  Initial Vital Signs: There were no vitals taken for this visit. BMI: 17.51 kg/m  Risk Assessment: Allergies: Reviewed. She is allergic to influenza vaccines; ciprofloxacin; flu virus vaccine; sulfa antibiotics; and tetracyclines & related.  Allergy Precautions: None required Coagulopathies: Reviewed. None identified.  Blood-thinner therapy: None at this time Active Infection(s): Reviewed. None identified. Ms. Leppert is afebrile  Site Confirmation: Ms. Dampier was asked to confirm the procedure and laterality before marking the site Procedure checklist: Completed Consent: Before the procedure and under the influence of no sedative(s), amnesic(s), or anxiolytics, the patient was informed of the treatment options, risks and possible complications. To fulfill our ethical and legal obligations, as recommended by the American Medical Association's Code of Ethics, I have informed the patient of my clinical impression; the nature and purpose of the treatment or procedure; the risks, benefits, and possible complications of the intervention; the alternatives, including doing nothing; the risk(s) and benefit(s) of the alternative treatment(s) or procedure(s); and the risk(s) and benefit(s) of doing nothing. The patient was provided information about the general risks and possible complications associated with the procedure. These may include, but  are not limited to: failure to achieve desired goals, infection, bleeding, organ or nerve damage, allergic reactions, paralysis, and death. In addition, the patient was informed of those risks and complications associated to Spine-related procedures, such as failure to decrease pain; infection (i.e.: Meningitis, epidural or intraspinal abscess); bleeding (i.e.:  epidural hematoma, subarachnoid hemorrhage, or any other type of intraspinal or peri-dural bleeding); organ or nerve damage (i.e.: Any type of peripheral nerve, nerve root, or spinal cord injury) with subsequent damage to sensory, motor, and/or autonomic systems, resulting in permanent pain, numbness, and/or weakness of one or several areas of the body; allergic reactions; (i.e.: anaphylactic reaction); and/or death. Furthermore, the patient was informed of those risks and complications associated with the medications. These include, but are not limited to: allergic reactions (i.e.: anaphylactic or anaphylactoid reaction(s)); adrenal axis suppression; blood sugar elevation that in diabetics may result in ketoacidosis or comma; water retention that in patients with history of congestive heart failure may result in shortness of breath, pulmonary edema, and decompensation with resultant heart failure; weight gain; swelling or edema; medication-induced neural toxicity; particulate matter embolism and blood vessel occlusion with resultant organ, and/or nervous system infarction; and/or aseptic necrosis of one or more joints. Finally, the patient was informed that Medicine is not an exact science; therefore, there is also the possibility of unforeseen or unpredictable risks and/or possible complications that may result in a catastrophic outcome. The patient indicated having understood very clearly. We have given the patient no guarantees and we have made no promises. Enough time was given to the patient to ask questions, all of which were answered to the  patient's satisfaction. Ms. Leuthold has indicated that she wanted to continue with the procedure. Attestation: I, the ordering provider, attest that I have discussed with the patient the benefits, risks, side-effects, alternatives, likelihood of achieving goals, and potential problems during recovery for the procedure that I have provided informed consent. Date: 06/10/2017; Time: 7:47 AM  Pre-Procedure Preparation:  Monitoring: As per clinic protocol. Respiration, ETCO2, SpO2, BP, heart rate and rhythm monitor placed and checked for adequate function Safety Precautions: Patient was assessed for positional comfort and pressure points before starting the procedure. Time-out: I initiated and conducted the "Time-out" before starting the procedure, as per protocol. The patient was asked to participate by confirming the accuracy of the "Time Out" information. Verification of the correct person, site, and procedure were performed and confirmed by me, the nursing staff, and the patient. "Time-out" conducted as per Joint Commission's Universal Protocol (UP.01.01.01). "Time-out" Date & Time: 06/10/2017; 1008 hrs.  Description of Procedure #1 Process:   Target Area: The  interlaminar space, initially targeting the lower border of the superior vertebral body lamina. Approach: Posterior paramedial approach. Area Prepped: Entire Posterior Lumbosacral Region Prepping solution: ChloraPrep (2% chlorhexidine gluconate and 70% isopropyl alcohol) Safety Precautions: Aspiration looking for blood return was conducted prior to all injections. At no point did we inject any substances, as a needle was being advanced. No attempts were made at seeking any paresthesias. Safe injection practices and needle disposal techniques used. Medications properly checked for expiration dates. SDV (single dose vial) medications used. Description of the Procedure: Protocol guidelines were followed. The patient was placed in position over the  fluoroscopy table. The target area was identified and the area prepped in the usual manner. Skin desensitized using vapocoolant spray. Skin & deeper tissues infiltrated with local anesthetic. Appropriate amount of time allowed to pass for local anesthetics to take effect. The procedure needle was introduced through the skin, ipsilateral to the reported pain, and advanced to the target area. Bone was contacted and the needle walked caudad, until the lamina was cleared. The ligamentum flavum was engaged and loss-of-resistance technique used as the epidural needle was advanced. The epidural space was identified using "loss-of-resistance  technique" with 2-3 ml of PF-NaCl (0.9% NSS), in a 5cc LOR glass syringe. Proper needle placement secured. Negative aspiration confirmed. Solution injected in intermittent fashion, asking for systemic symptoms every 0.5cc of injectate. The needles were then removed and the area cleansed, making sure to leave some of the prepping solution back to take advantage of its long term bactericidal properties. Start Time: 1008 hrs. Materials:  Needle(s) Type: Epidural needle Gauge: 17G Length: 3.5-in Medication(s): We administered lactated ringers, midazolam, fentaNYL, iopamidol, dexamethasone, ropivacaine (PF) 2 mg/mL (0.2%), sodium chloride flush, triamcinolone acetonide, ropivacaine (PF) 2 mg/mL (0.2%), and lidocaine (PF). Please see chart orders for dosing details.  Description of Procedure #2 Process:   Target Area: The inferior and lateral portion of the pedicle, just lateral to a line created by the 6:00 position of the pedicle and the superior articular process of the vertebral body below. On the lateral view, this target lies just posterior to the anterior aspect of the lamina and posterior to the midpoint created between the anterior and the posterior aspect of the neural foramina. Approach: Posterior paravertebral approach. Area Prepped: Same as above Prepping solution:  Same as above Safety Precautions: Same as above Description of the Procedure: Protocol guidelines were followed. The patient was placed in position over the fluoroscopy table. The target area was identified and the area prepped in the usual manner. Skin desensitized using vapocoolant spray. Skin & deeper tissues infiltrated with local anesthetic. Appropriate amount of time allowed to pass for local anesthetics to take effect. The procedure needles were then advanced to the target area. Proper needle placement secured. Negative aspiration confirmed. Solution injected in intermittent fashion, asking for systemic symptoms every 0.2cc of injectate. The needles were then removed and the area cleansed, making sure to leave some of the prepping solution back to take advantage of its long term bactericidal properties. Vitals:   06/10/17 1028 06/10/17 1037 06/10/17 1047 06/10/17 1056  BP: (!) 148/71 (!) 108/56 (!) 125/59 (!) 155/65  Pulse:   63 62  Resp: 11 16 16 16   Temp:      SpO2: 98% 100% 99% 99%  Weight:      Height:        End Time: 1018 hrs. Materials:  Needle(s) Type: Regular needle Gauge: 22G Length: 3.5-in Medication(s): We administered lactated ringers, midazolam, fentaNYL, iopamidol, dexamethasone, ropivacaine (PF) 2 mg/mL (0.2%), sodium chloride flush, triamcinolone acetonide, ropivacaine (PF) 2 mg/mL (0.2%), and lidocaine (PF). Please see chart orders for dosing details.  Imaging Guidance (Spinal):  Type of Imaging Technique: Fluoroscopy Guidance (Spinal) Indication(s): Assistance in needle guidance and placement for procedures requiring needle placement in or near specific anatomical locations not easily accessible without such assistance. Exposure Time: Please see nurses notes. Contrast: Before injecting any contrast, we confirmed that the patient did not have an allergy to iodine, shellfish, or radiological contrast. Once satisfactory needle placement was completed at the desired  level, radiological contrast was injected. Contrast injected under live fluoroscopy. No contrast complications. See chart for type and volume of contrast used. Fluoroscopic Guidance: I was personally present during the use of fluoroscopy. "Tunnel Vision Technique" used to obtain the best possible view of the target area. Parallax error corrected before commencing the procedure. "Direction-depth-direction" technique used to introduce the needle under continuous pulsed fluoroscopy. Once target was reached, antero-posterior, oblique, and lateral fluoroscopic projection used confirm needle placement in all planes. Images permanently stored in EMR. Interpretation: I personally interpreted the imaging intraoperatively. Adequate needle placement confirmed in multiple  planes. Appropriate spread of contrast into desired area was observed. No evidence of afferent or efferent intravascular uptake. No intrathecal or subarachnoid spread observed. Permanent images saved into the patient's record.  Antibiotic Prophylaxis:  Indication(s): None identified Antibiotic given: None  Post-operative Assessment:  EBL: None Complications: No immediate post-treatment complications observed by team, or reported by patient. Note: The patient tolerated the entire procedure well. A repeat set of vitals were taken after the procedure and the patient was kept under observation following institutional policy, for this type of procedure. Post-procedural neurological assessment was performed, showing return to baseline, prior to discharge. The patient was provided with post-procedure discharge instructions, including a section on how to identify potential problems. Should any problems arise concerning this procedure, the patient was given instructions to immediately contact us, at any time, without hesitation. In any case, we plan to contact the patient by telephone for a follow-up status report regarding this interventional  procedure. Comments:  No additional relevant information.  Plan of Care  Disposition: Discharge home  Discharge Date & Time: 06/10/2017; 1100 hrs.  Physician-requested Follow-up:  Return for post-procedure eval (in 2 wks).  Future Appointments Date Time Provider [place]  06/15/2017 2:10 PM Gae Dry, MD [facility] None  07/02/2017 12:20 PM [facility]  07/07/2017 10:45 AM Milinda Pointer, MD [facility] None  07/14/2017 11:00 AM Milinda Pointer, MD [facility] None  07/20/2017 1:30 PM Bary Castilla Forest Gleason, MD [facility] None  02/10/2018 1:30 PM Dohmeier, Asencion Partridge, MD [facility] None   Medications ordered for procedure: Meds ordered this encounter  Medications  . lactated ringers infusion 1,000 mL  . midazolam (VERSED) 5 MG/5ML injection 1-2 mg    Make sure Flumazenil is available in the pyxis when using this medication. If oversedation occurs, administer 0.2 mg IV over 15 sec. If after 45 sec no response, administer 0.2 mg again over 1 min; may repeat at 1 min intervals; not to exceed 4 doses (1 mg)  . fentaNYL (SUBLIMAZE) injection 25-50 mcg    Make sure Narcan is available in the pyxis when using this medication. In the event of respiratory depression (RR< 8/min): Titrate NARCAN (naloxone) in increments of 0.1 to 0.2 mg IV at 2-3 minute intervals, until desired degree of reversal.  . lidocaine 1.5 % injection 20 mL    This order is for documentation purposes only. Lidocaine w/ preservative included in block trays.  . iopamidol (ISOVUE-M) 41 % intrathecal injection 10 mL  . dexamethasone (DECADRON) injection 10 mg  . ropivacaine (PF) 2 mg/mL (0.2%) (NAROPIN) injection 1 mL  . sodium chloride flush (NS) 0.9 % injection 1 mL  . triamcinolone acetonide (KENALOG-40) injection 40 mg  . ropivacaine (PF) 2 mg/mL (0.2%) (NAROPIN) injection 2 mL  . sodium chloride flush (NS) 0.9 % injection 2 mL  . lidocaine (PF) (XYLOCAINE) 1 % injection 5 mL   Medications administered: We  administered lactated ringers, midazolam, fentaNYL, iopamidol, dexamethasone, ropivacaine (PF) 2 mg/mL (0.2%), sodium chloride flush, triamcinolone acetonide, ropivacaine (PF) 2 mg/mL (0.2%), and lidocaine (PF).  See the medical record for exact dosing, route, and time of administration.  Lab-work, Procedure(s), & Referral(s) Ordered: Orders Placed This Encounter  Procedures  . Lumbar Epidural Injection  . Lumbar Transforaminal Epidural  . DG C-Arm 1-60 Min-No Report  . Informed Consent Details: Transcribe to consent form and obtain patient signature  . Provider attestation of informed consent for procedure/surgical case  . Verify informed consent  . Discharge instructions  . Follow-up  Imaging Ordered: Results for orders placed in visit on 05/11/17  DG C-Arm 1-60 Min-No Report   Narrative Fluoroscopy was utilized by the requesting physician.  No radiographic  interpretation.    New Prescriptions   No medications on file   Primary Care Physician: Idelle Crouch, MD Location: [facility] Note by: Kathlen Brunswick. Dossie Arbour, M.D, DABA, DABAPM, DABPM, DABIPP, FIPP Date: 06/10/2017; Time: 11:23 AM  Disclaimer:  Medicine is not an Chief Strategy Officer. The only guarantee in medicine is that nothing is guaranteed. It is important to note that the decision to proceed with this intervention was based on the information collected from the patient. The Data and conclusions were drawn from the patient's questionnaire, the interview, and the physical examination. Because the information was provided in large part by the patient, it cannot be guaranteed that it has not been purposely or unconsciously manipulated. Every effort has been made to obtain as much relevant data as possible for this evaluation. It is important to note that the conclusions that lead to this procedure are derived in large part from the available data. Always take into account that the treatment will also be  dependent on availability of resources and existing treatment guidelines, considered by other Pain Management Practitioners as being common knowledge and practice, at the time of the intervention. For Medico-Legal purposes, it is also important to point out that variation in procedural techniques and pharmacological choices are the acceptable norm. The indications, contraindications, technique, and results of the above procedure should only be interpreted and judged by a Board-Certified Interventional Pain Specialist with extensive familiarity and expertise in the same exact procedure and technique.  Instructions provided at this appointment: Patient Instructions  ____________________________________________________________________________________________  Post-Procedure instructions Instructions:  Apply ice: Fill a plastic sandwich bag with crushed ice. Cover it with a small towel and apply to injection site. Apply for 15 minutes then remove x 15 minutes. Repeat sequence on day of procedure, until you go to bed. The purpose is to minimize swelling and discomfort after procedure.  Apply heat: Apply heat to procedure site starting the day following the procedure. The purpose is to treat any soreness and discomfort from the procedure.  Food intake: Start with clear liquids (like water) and advance to regular food, as tolerated.   Physical activities: Keep activities to a minimum for the first 8 hours after the procedure.   Driving: If you have received any sedation, you are not allowed to drive for 24 hours after your procedure.  Blood thinner: Restart your blood thinner 6 hours after your procedure. (Only for those taking blood thinners)  Insulin: As soon as you can eat, you may resume your normal dosing schedule. (Only for those taking insulin)  Infection prevention: Keep procedure site clean and dry.  Post-procedure Pain Diary: Extremely important that this be done correctly and accurately.  Recorded information will be used to determine the next step in treatment.  Pain evaluated is that of treated area only. Do not include pain from an untreated area.  Complete every hour, on the hour, for the initial 8 hours. Set an alarm to help you do this part accurately.  Do not go to sleep and have it completed later. It will not be accurate.  Follow-up appointment: Keep your follow-up appointment after the procedure. Usually 2 weeks for most procedures. (6 weeks in the case of radiofrequency.) Bring you pain diary.  Expect:  From numbing medicine (AKA: Local Anesthetics): Numbness or decrease in  pain.  Onset: Full effect within 15 minutes of injected.  Duration: It will depend on the type of local anesthetic used. On the average, 1 to 8 hours.   From steroids: Decrease in swelling or inflammation. Once inflammation is improved, relief of the pain will follow.  Onset of benefits: Depends on the amount of swelling present. The more swelling, the longer it will take for the benefits to be seen. In some cases, up to 10 days.  Duration: Steroids will stay in the system x 2 weeks. Duration of benefits will depend on multiple posibilities including persistent irritating factors.  From procedure: Some discomfort is to be expected once the numbing medicine wears off. This should be minimal if ice and heat are applied as instructed. Call if:  You experience numbness and weakness that gets worse with time, as opposed to wearing off.  New onset bowel or bladder incontinence. (Spinal procedures only)  Emergency Numbers:  [place] business hours (Monday - Thursday, 8:00 AM - 4:00 PM) (Friday, 9:00 AM - 12:00 Noon): [phone]  After hours: [phone] ____________________________________________________________________________________________

## 2017-06-10 NOTE — Patient Instructions (Signed)

## 2017-06-10 NOTE — Progress Notes (Signed)
Safety precautions to be maintained throughout the outpatient stay will include: orient to surroundings, keep bed in low position, maintain call bell within reach at all times, provide assistance with transfer out of bed and ambulation.  

## 2017-06-11 ENCOUNTER — Telehealth: Payer: Self-pay

## 2017-06-11 NOTE — Telephone Encounter (Signed)
Denies any needs at this time. States pain is good.

## 2017-06-15 ENCOUNTER — Encounter: Payer: Self-pay | Admitting: Obstetrics & Gynecology

## 2017-06-15 ENCOUNTER — Ambulatory Visit (INDEPENDENT_AMBULATORY_CARE_PROVIDER_SITE_OTHER): Payer: Medicare Other | Admitting: Obstetrics & Gynecology

## 2017-06-15 VITALS — BP 120/80 | HR 85 | Ht 65.0 in | Wt 102.0 lb

## 2017-06-15 DIAGNOSIS — N951 Menopausal and female climacteric states: Secondary | ICD-10-CM | POA: Insufficient documentation

## 2017-06-15 DIAGNOSIS — Z01419 Encounter for gynecological examination (general) (routine) without abnormal findings: Secondary | ICD-10-CM

## 2017-06-15 DIAGNOSIS — Z0000 Encounter for general adult medical examination without abnormal findings: Secondary | ICD-10-CM

## 2017-06-15 MED ORDER — ESTRADIOL-LEVONORGESTREL 0.045-0.015 MG/DAY TD PTWK: MEDICATED_PATCH | TRANSDERMAL | 3 refills | Status: DC

## 2017-06-15 NOTE — Progress Notes (Signed)
HPI:      [name] is a 71 y.o. No obstetric history on file. who LMP was in the past, she presents today for her annual examination.  The patient has no complaints today. The patient is sexually active. Herlast pap: approximate date 2016 and was normal and last mammogram: approximate date 2017 and was normal.  The patient does perform self breast exams.  There is notable family history of breast or ovarian cancer in her family. The patient is taking hormone replacement therapy. Patient denies post-menopausal vaginal bleeding.   The patient has regular exercise: yes. The patient denies current symptoms of depression.    GYN Hx: Last Colonoscopy:2 years ago. Normal.  Last DEXA: 2 years ago.    PMHx: Past Medical History:  Diagnosis Date  . Anemia   . Bipolar affective disorder ([place])    2  . Broken foot 2015   right foot  . Chronic gastritis   . Chronic kidney disease    Dr Holley Raring  . COPD (chronic obstructive pulmonary disease) ([place])   . Dizziness    inner ear (per pt) several times per week  . Dysphagia   . Glaucoma   . Hyperlipemia   . Hypertension    pt has recently come off BP meds.  MD aware. BP seems stable.  . Lithium toxicity 2015  . 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 Neck stiffness    s/p C3-C4 fusion, limited right turn  . Osteoarthritis    back  . Pancreatitis   . Personal history of malignant neoplasm of breast   . Pituitary microadenoma ([place])   . Recurrent UTI   . Renal insufficiency   . Stomach ulcer 2112   Past Surgical History:  Procedure Laterality Date  . APPENDECTOMY    . BREAST BIOPSY Right 2011   neg  . BREAST EXCISIONAL BIOPSY Right 2001   neg  . BREAST EXCISIONAL BIOPSY Right 2008   breast ca 2008 radiation  . BREAST EXCISIONAL BIOPSY Left 10/26/2012   neg  . BREAST SURGERY Left  2013   left breast wide excision,Intraductal papilloma, ductal  hyperplasia and sclerosing adenosis. Microcalcifications associated with columnar cell change. No evidence of atypia or malignancy. Margins are unremarkable.  [facility] Kitchen BREAST SURGERY Right November 06, 1999   multiple areas of microcalcification showing evidence of sclerosing adenosis and ductal hyperplasia.  Marland Kitchen BREAST SURGERY Left May 07, 2007   wide excision.  . cataract Right 2013  . CATARACT EXTRACTION W/PHACO Left 03/05/2016   Procedure: CATARACT EXTRACTION PHACO AND INTRAOCULAR LENS PLACEMENT (IOC);  Surgeon: Leandrew Koyanagi, MD;  Location: [place];  Service: Ophthalmology;  Laterality: Left;  . COLONOSCOPY  2004  . COLONOSCOPY N/A 05/14/2015   Procedure: COLONOSCOPY;  Surgeon: Manya Silvas, MD;  Location[facility]COPY;  Service: Endoscopy;  Laterality: N/A;  . ERCP N/A   . ESOPHAGOGASTRODUODENOSCOPY N/A 05/14/2015   Procedure: ESOPHAGOGASTRODUODENOSCOPY (EGD);  Surgeon: Manya Silvas, MD;  Location: [facility] ENDOSCOPY;  Service: Endoscopy;  Laterality: N/A;  . EUS N/A 2011, 2012  . EYE SURGERY  2013  . GLAUCOMA SURGERY Right 2013  . LAPAROSCOPIC RIGHT COLECTOMY Right 06/22/2015   Procedure: LAPAROSCOPIC RIGHT COLECTOMY;  Surgeon: Elda Dunkerson Bellow, MD;  Location: [facility] ORS;  Service: General;  Laterality: Right;  . NECK SURGERY  2006  . right breast cancer    . SAVORY DILATION N/A 05/14/2015  Procedure: SAVORY DILATION;  Surgeon: Manya Silvas, MD;  Location: [facility] ENDOSCOPY;  Service: Endoscopy;  Laterality: N/A;  . TRABECULECTOMY Left 03/05/2016   Procedure: TRABECULECTOMY WITH [facility] AND EXPRESS SHUNT;  Surgeon: Leandrew Koyanagi, MD;  Location: [place];  Service: Ophthalmology;  Laterality: Left;  . TUBAL LIGATION     Family History  Problem Relation Age of Onset  . Breast cancer Mother 6   Social History  Substance Use Topics  . Smoking status: Former Smoker    Packs/day: 1.00    Years: 35.00    Quit date: 12/23/2007  . Smokeless tobacco: Never Used    . Alcohol use 0.0 oz/week     Comment: 1 glass wine/mo    Current Outpatient Prescriptions:  .  ALPRAZolam (XANAX) 0.5 MG tablet, Take 0.5 mg by mouth 2 (two) times daily as needed for sleep. 0.5-1 tablet, Disp: , Rfl:  .  aspirin EC 81 MG tablet, Take 1 tablet (81 mg total) by mouth daily., Disp: 90 tablet, Rfl:  .  bisacodyl (DULCOLAX) 5 MG EC tablet, Take 5 mg by mouth daily as needed for moderate constipation., Disp: , Rfl:  .  Brimonidine Tartrate-Timolol (COMBIGAN OP), Apply 1 drop to eye 2 (two) times daily., Disp: , Rfl:  .  buPROPion (WELLBUTRIN SR) 150 MG 12 hr tablet, Take 150 mg by mouth every morning. 2 tablets, Disp: , Rfl:  .  dextromethorphan 15 MG/5ML syrup, Take 10 mLs by mouth 4 (four) times daily as needed for cough., Disp: , Rfl:  .  escitalopram (LEXAPRO) 10 MG tablet, Take 10 mg by mouth daily. , Disp: , Rfl: 0 .  esomeprazole (NEXIUM) 40 MG capsule, Take 40 mg by mouth. , Disp: , Rfl:  .  estradiol-levonorgestrel (CLIMARA PRO) 0.045-0.015 MG/DAY, apply 1 patch every week, Disp: 12 patch, Rfl: 3 .  fluticasone furoate-vilanterol (BREO ELLIPTA) 100-25 MCG/INH AEPB, Inhale 1 puff into the lungs daily., Disp: 3 each, Rfl: 3 .  gabapentin (NEURONTIN) 100 MG capsule, Take 1-3 capsules (100-300 mg total) by mouth at bedtime., Disp: 90 capsule, Rfl: 0 .  ipratropium (ATROVENT) 0.06 % nasal spray, Place 2 sprays into both nostrils 4 (four) times daily as needed for rhinitis., Disp: , Rfl:  .  losartan (COZAAR) 25 MG tablet, Take 25 mg by mouth daily., Disp: , Rfl:  .  LUMIGAN 0.01 % SOLN, apply 1 drop to into right eye at bedtime once daily ONLY, Disp: , Rfl: 0 .  nicotine polacrilex (NICORETTE) 2 MG gum, Take 2 mg by mouth as needed for smoking cessation., Disp: , Rfl:  .  ondansetron (ZOFRAN) 8 MG tablet, Take by mouth every 8 (eight) hours as needed for nausea or vomiting., Disp: , Rfl:  .  ranitidine (ZANTAC) 150 MG tablet, Take 150 mg by mouth daily., Disp: , Rfl:  .   rosuvastatin (CRESTOR) 10 MG tablet, Take 10 mg by mouth at bedtime. , Disp: , Rfl:  Allergies: Influenza vaccines; Ciprofloxacin; Flu virus vaccine; Sulfa antibiotics; and Tetracyclines & related  Review of Systems  Constitutional: Negative for chills, fever and malaise/fatigue.  HENT: Negative for congestion, sinus pain and sore throat.   Eyes: Negative for blurred vision and pain.  Respiratory: Negative for cough and wheezing.   Cardiovascular: Negative for chest pain and leg swelling.  Gastrointestinal: Negative for abdominal pain, constipation, diarrhea, heartburn, nausea and vomiting.  Genitourinary: Negative for dysuria, frequency, hematuria and urgency.  Musculoskeletal: Negative for back pain, joint pain, myalgias and  neck pain.  Skin: Negative for itching and rash.  Neurological: Negative for dizziness, tremors and weakness.  Endo/Heme/Allergies: Does not bruise/bleed easily.  Psychiatric/Behavioral: Negative for depression. The patient is not nervous/anxious and does not have insomnia.     Objective: BP 120/80   Pulse 85   Ht 5\' 5"  (1.651 m)   Wt 102 lb (46.3 kg)   BMI 16.97 kg/m   Filed Weights   06/15/17 1405  Weight: 102 lb (46.3 kg)   Body mass index is 16.97 kg/m. Physical Exam  Constitutional: She is oriented to person, place, and time. She appears well-developed and well-nourished. No distress.  Genitourinary: Rectum normal, vagina normal and uterus normal. Pelvic exam was performed with patient supine. There is no rash or lesion on the right labia. There is no rash or lesion on the left labia. Vagina exhibits no lesion. No bleeding in the vagina. Right adnexum does not display mass and does not display tenderness. Left adnexum does not display mass and does not display tenderness. Cervix does not exhibit motion tenderness, lesion, friability or polyp.   Uterus is mobile and midaxial. Uterus is not enlarged or exhibiting a mass.  Genitourinary Comments: Mild  atrophy. G1 cystocele  HENT:  Head: Normocephalic and atraumatic. Head is without laceration.  Right Ear: Hearing normal.  Left Ear: Hearing normal.  Nose: No epistaxis.  No foreign bodies.  Mouth/Throat: Uvula is midline, oropharynx is clear and moist and mucous membranes are normal.  Eyes: Pupils are equal, round, and reactive to light.  Neck: Normal range of motion. Neck supple. No thyromegaly present.  Cardiovascular: Normal rate and regular rhythm.  Exam reveals no gallop and no friction rub.   No murmur heard. Pulmonary/Chest: Effort normal and breath sounds normal. No respiratory distress. She has no wheezes. Right breast exhibits no mass, no skin change and no tenderness. Left breast exhibits no mass, no skin change and no tenderness.  Abdominal: Soft. Bowel sounds are normal. She exhibits no distension. There is no tenderness. There is no rebound.  Musculoskeletal: Normal range of motion.  Neurological: She is alert and oriented to person, place, and time. No cranial nerve deficit.  Skin: Skin is warm and dry.  Psychiatric: She has a normal mood and affect. Judgment normal.  Vitals reviewed.   Assessment: Annual Exam 1. Annual physical exam   2. Menopausal and female climacteric states     Plan:            1.  Cervical Screening-  Pap smear schedule reviewed with patient  2. Breast screening- Exam annually and mammogram scheduled  3. Colonoscopy every 10 years, Hemoccult testing after age 64  4. Labs managed by PCP  5. Counseling for hormonal therapy: no change in therapy today     F/U  Return in about 1 year (around 06/15/2018) for Annual.  Barnett Applebaum, MD, [facility], [facility] 06/15/2017  2:33 PM

## 2017-06-15 NOTE — Patient Instructions (Addendum)
PAP every three years Mammogram every year Colonoscopy every 10 years Labs yearly (with PCP)  Estradiol; Levonorgestrel skin patches What is this medicine? ESTRADIOL; LEVONORGESTREL (es tra DYE ole; LEE voh nor jes trel) is used as hormone replacement in menopausal women who still have their uterus. This medicine is used to relieve the symptoms of menopause. It also helps to prevent osteoporosis in postmenopausal women. This medicine may be used for other purposes; ask your health care provider or pharmacist if you have questions. COMMON BRAND NAME(S): Climara Pro What should I tell my health care provider before I take this medicine? They need to know if you have any of these conditions: -blood vessel disease or blood clots -breast, cervical, endometrial, or uterine cancer -diabetes -endometriosis -fibroids -gallbladder disease -heart disease or recent heart attack -high blood cholesterol -high blood pressure -high level of calcium in the blood -hysterectomy -kidney disease -liver disease -mental depression -migraine headaches -porphyria -stroke -systemic lupus erythematosus (SLE) -tobacco smoker -vaginal bleeding -an unusual or allergic reaction to estrogens, progestins, other medicines, foods, dyes, or preservatives -pregnant or trying to get pregnant -breast-feeding How should I use this medicine? This medicine is for external use only. Follow the directions on the prescription label. Tear open the pouch, do not use scissors. Remove the stiff protective liner covering the adhesive. Try not to touch the adhesive. Apply the patch, sticky side to the skin, to an area that is clean, dry and hairless. Avoid injured, irritated, calloused, or scarred areas. Do not apply the skin patches to your breasts or around the waistline. Use a different site each time to prevent skin irritation. Do not cut or trim the patch. Do not stop using except on the advice of your doctor or health care  professional. Do not wear more than one patch at a time unless you are told to do so by your doctor or health care professional. Contact your pediatrician regarding the use of this medicine in children. Special care may be needed. A patient package insert for the product will be given with each prescription and refill. Read this sheet carefully each time. The sheet may change frequently. Overdosage: If you think you have taken too much of this medicine contact a poison control center or emergency room at once. NOTE: This medicine is only for you. Do not share this medicine with others. What if I miss a dose? If you miss a dose, apply it as soon as you can. If it is almost time for your next dose, apply only that dose. Do not apply double or extra doses. What may interact with this medicine? Do not take this medicine with any of the following medications: -aromatase inhibitors like aminoglutethimide, anastrozole, exemestane, letrozole, testolactone This medicine may also interact with the following medications: -barbiturates, like phenobarbital -carbamazepine -certain antibiotics used to treat infections -grapefruit juice -medicines for fungus infections like itraconazole and ketoconazole -raloxifene or tamoxifen -rifabutin, rifampin, or rifapentine -ritonavir -St. John's Wort This list may not describe all possible interactions. Give your health care provider a list of all the medicines, herbs, non-prescription drugs, or dietary supplements you use. Also tell them if you smoke, drink alcohol, or use illegal drugs. Some items may interact with your medicine. What should I watch for while using this medicine? Visit your doctor or health care professional for regular checks on your progress. You will need a regular breast and pelvic exam and Pap smear while on this medicine. You should also discuss the need for regular  mammograms with your health care professional, and follow his or her guidelines  for these tests. This medicine can make your body retain fluid, making your fingers, hands, or ankles swell. Your blood pressure can go up. Contact your doctor or health care professional if you feel you are retaining fluid. If you have any reason to think you are pregnant, stop taking this medicine right away and contact your doctor or health care professional. Smoking increases the risk of getting a blood clot or having a stroke while you are taking this medicine, especially if you are more than 69 years old. You are strongly advised not to smoke. If you wear contact lenses and notice visual changes, or if the lenses begin to feel uncomfortable, consult your eye doctor or health care professional. If you are going to have surgery or an MRI, you may need to stop taking this medicine. Consult your health care professional for advice before you schedule the surgery. Contact with water while you are swimming, using a sauna, bathing, or showering may cause the patch to fall off. If your patch falls off reapply it. If you cannot reapply the patch, apply a new patch to another area and continue to follow your usual dose schedule. What side effects may I notice from receiving this medicine? Side effects that you should report to your doctor or health care professional as soon as possible: -breakthrough bleeding and spotting -breast tissue changes or discharge -chest pain -leg, arm or groin pain -nausea, vomiting -severe headaches -severe stomach pain -sudden shortness of breath -yellowing of the eyes or skin Side effects that usually do not require medical attention (report to your doctor or health care professional if they continue or are bothersome): -changes in sexual desire -mood changes, anxiety, depression, frustration, anger, or emotional outbursts -increased or decreased appetite -skin rash, acne, or brown spots on the skin -symptoms of vaginal infection like itching, irritation or unusual  discharge -weight gain This list may not describe all possible side effects. Call your doctor for medical advice about side effects. You may report side effects to FDA at 1-800-FDA-1088. Where should I keep my medicine? Keep out of the reach of children. Store at room temperature between 15 and 30 degrees C (59 and 86 degrees F). Do not store any patches that have been removed from their protective pouch. Throw away any unused medicine after the expiration date. Dispose of used patches properly. Since used patches may still contain active hormones, fold the patch in half so that it sticks to itself prior to disposal. NOTE: This sheet is a summary. It may not cover all possible information. If you have questions about this medicine, talk to your doctor, pharmacist, or health care provider.  2018 Elsevier/Gold Standard (2016-07-01 12:49:52)

## 2017-07-02 ENCOUNTER — Other Ambulatory Visit: Payer: Self-pay | Admitting: Pulmonary Disease

## 2017-07-02 ENCOUNTER — Ambulatory Visit
Admission: RE | Admit: 2017-07-02 | Discharge: 2017-07-02 | Disposition: A | Discharge status: Home / Self Care | Payer: Medicare Other | Source: Ambulatory Visit | Attending: General Surgery | Admitting: General Surgery

## 2017-07-02 DIAGNOSIS — Z1231 Encounter for screening mammogram for malignant neoplasm of breast: Secondary | ICD-10-CM | POA: Insufficient documentation

## 2017-07-06 NOTE — Progress Notes (Signed)
Patient's Name: MEHR DEPAOLI  MRN: [id number]  Referring Provider: Idelle Crouch, MD  DOB: [birth year]  PCP: Idelle Crouch, MD  DOS: 07/07/2017  Note by: Gaspar Cola, MD  Service setting: Ambulatory outpatient  Specialty: Interventional Pain Management  Location: [facility] (AMB) Pain Management Facility    Patient type: Established   Primary Reason(s) for Visit: Encounter for post-procedure evaluation of chronic illness with mild to moderate exacerbation CC: Back Pain (lower)  HPI  Ms. Cooley is a 71 y.o. year old, female patient, who comes today for a post-procedure evaluation. She has Breast cancer of upper-outer quadrant of right female breast ([place]); Personal history of malignant neoplasm of breast; Colon polyp; Loss of weight; Cervical spine disease; Pituitary microadenoma ([place]); History of breast cancer in adulthood; Dysmetria; Breast cancer ([place]); Bipolar disorder ([place]); Chronic pancreatitis ([place]); COPD (chronic obstructive pulmonary disease) (HCC); HTN (hypertension); OA (osteoarthritis); Renal insufficiency; Chronic pain syndrome; Chronic shoulder pain (Location of Primary Source of Pain) (Bilateral) (L>R); Osteoarthritis of shoulder (Bilateral); Chronic neck pain (Location of Secondary source of pain) (Bilateral) (L>R); History of C3-5 ACDF; Numbness and tingling in left upper extremity; Numbness and tingling of right upper extremity; Upper extremity weakness (Bilateral) (L>R); Numbness of upper extremity (Bilateral) (L>R); Numbness and tingling of lower extremity (Bilateral) (L>R); Weakness of lower extremity (Bilateral) (R>L); Chronic abdominal pain (epigastric); Cervical syndrome; Closed fracture of metatarsal bone; 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); 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); Abnormal MRI, cervical spine; 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[place]); Cervical radiculitis; Chronic lumbar radiculopathy (Bilateral) (L>R) (left L5); Chronic low back pain  (Bilateral) (L>R); Abnormal MRI, lumbar spine; 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 Her primarily concern today is the Back Pain (lower)  Pain Assessment: Location: Lower Back Radiating: stays in the lower back; "feels as if I drag my feet"  Onset: More than a month ago Duration: Chronic pain Quality: Aching, Constant, Radiating (movement irritates the lower back on the left side) Severity: 5 /10 (self-reported pain score)  Note: Reported level is compatible with observation.                   Effect on ADL: pace self Timing: Intermittent (constant pain on the left when moving) Modifying factors: rest and heating pad  Ms. Boyack comes in today for post-procedure evaluation after the treatment done on 06/10/2017.  Further details on both, my assessment(s), as well as the proposed treatment plan, please see below.  Post-Procedure Assessment  06/10/2017 Procedure: Diagnostic left L4-5 interlaminar epidural steroid injection + left L4-5 transforaminal epidural steroid injection under fluoroscopic guidance and IV sedation Pre-procedure pain score:  5/10 Post-procedure pain score: 0/10 (100% relief) Influential Factors: BMI: 17.14 kg/m Intra-procedural challenges: None observed.         Assessment challenges: None detected.              Reported side-effects: None.        Post-procedural adverse reactions or complications: None reported         Sedation: Sedation provided. When no  sedatives are used, the analgesic levels obtained are directly associated  to the effectiveness of the local anesthetics. However, when sedation is provided, the level of analgesia obtained during the initial 1 hour following the intervention, is believed to be the result of a combination of factors. These factors may include, but are not limited to: 1. The effectiveness of the local anesthetics used. 2. The effects of the analgesic(s) and/or anxiolytic(s) used. 3. The degree of discomfort experienced by the patient at the time of the procedure. 4. The patients ability and reliability in recalling and recording the events. 5. The presence and influence of possible secondary gains and/or psychosocial factors. Reported result: Relief experienced during the 1st hour after the procedure: 100 % (Ultra-Short Term Relief) Interpretative annotation: Clinically appropriate result. Analgesia during this period is likely to be Local Anesthetic and/or IV Sedative (Analgesic/Anxiolytic) related.          Effects of local anesthetic: The analgesic effects attained during this period are directly associated to the localized infiltration of local anesthetics and therefore cary significant diagnostic value as to the etiological location, or anatomical origin, of the pain. Expected duration of relief is directly dependent on the pharmacodynamics of the local anesthetic used. Long-acting (4-6 hours) anesthetics used.  Reported result: Relief during the next 4 to 6 hour after the procedure: 100 % (Short-Term Relief) Interpretative annotation: Clinically appropriate result. Analgesia during this period is likely to be Local Anesthetic-related.          Long-term benefit: Defined as the period of time past the expected duration of local anesthetics. With the possible exception of prolonged sympathetic blockade from the local anesthetics, benefits during this period are typically attributed to, or associated with, other  factors such as analgesic sensory neuropraxia, antiinflammatory effects, or beneficial biochemical changes provided by agents other than the local anesthetics Reported result: Extended relief following procedure: 50 % (had 40-60% relief and pain is different everyday depending on the amount of activity; pain is better in the morning.) (Long-Term Relief) Interpretative annotation: Clinically appropriate result. Good relief. No permanent benefit expected. Inflammation plays a part in the etiology to the pain.          Current benefits: Defined as persistent relief that continues at this point in time.   Reported results: Treated area: 50 %       Interpretative annotation: Recurrence of symptoms. No permanent benefit expected. Effective diagnostic intervention.          Interpretation: Results would suggest a successful diagnostic intervention.          Laboratory Chemistry  Inflammation Markers (CRP: Acute Phase) (ESR: Chronic Phase) Lab Results  Component Value Date   CRP <0.8 03/19/2017   ESRSEDRATE 4 03/19/2017                 Renal Function Markers Lab Results  Component Value Date   BUN 12 03/19/2017   CREATININE 0.99 03/19/2017   GFRAA >60 03/19/2017   GFRNONAA 57 (L) 03/19/2017                 Hepatic Function Markers Lab Results  Component Value Date   AST 22 03/19/2017   ALT 14 03/19/2017   ALBUMIN 4.5 03/19/2017   ALKPHOS 53 03/19/2017                 Electrolytes Lab Results  Component Value Date   NA 139 03/19/2017   K 4.0 03/19/2017   CL 107 03/19/2017   CALCIUM 9.5 03/19/2017   MG 2.1 03/19/2017  Neuropathy Markers Lab Results  Component Value Date   ZYYQMGNO03 704 03/19/2017                 Bone Pathology Markers Lab Results  Component Value Date   ALKPHOS 53 03/19/2017   25OHVITD1 57 03/19/2017   25OHVITD2 1.2 03/19/2017   25OHVITD3 56 03/19/2017   CALCIUM 9.5 03/19/2017                 Coagulation Parameters Lab Results   Component Value Date   PLT 206 06/25/2015                 Cardiovascular Markers Lab Results  Component Value Date   HGB 10.0 (L) 06/25/2015   HCT 31.1 (L) 06/25/2015                 Note: Lab results reviewed.  Recent Diagnostic Imaging Review  Mm Screening Breast Tomo Bilateral Result Date: 07/03/2017 CLINICAL DATA:  Screening. EXAM: 2D DIGITAL SCREENING BILATERAL MAMMOGRAM WITH CAD AND ADJUNCT TOMO COMPARISON:  Previous exam(s). ACR Breast Density Category c: The breast tissue is heterogeneously dense, which may obscure small masses. FINDINGS: There are no findings suspicious for malignancy. Lumpectomy changes in the right breast. Images were processed with CAD. IMPRESSION: No mammographic evidence of malignancy. A result letter of this screening mammogram will be mailed directly to the patient. RECOMMENDATION: Screening mammogram in one year. (Code:[id number]-01Y) BI-RADS CATEGORY  1: Negative. Electronically Signed   By: Curlene Dolphin M.D.   On: 07/03/2017 16:09   Note: Imaging results reviewed.          Meds   Current Meds  Medication Sig  . ALPRAZolam (XANAX) 0.5 MG tablet Take 0.5 mg by mouth 2 (two) times daily as needed for sleep. 0.5-1 tablet  . aspirin EC 81 MG tablet Take 1 tablet (81 mg total) by mouth daily.  . AZOPT 1 % ophthalmic suspension Place 1 drop into the left eye.   . bisacodyl (DULCOLAX) 5 MG EC tablet Take 5 mg by mouth daily as needed for moderate constipation.  . Brimonidine Tartrate-Timolol (COMBIGAN OP) Apply 1 drop to eye 2 (two) times daily.  Marland Kitchen buPROPion (WELLBUTRIN SR) 150 MG 12 hr tablet Take 150 mg by mouth every morning. 2 tablets  . dextromethorphan 15 MG/5ML syrup Take 10 mLs by mouth 4 (four) times daily as needed for cough.  . escitalopram (LEXAPRO) 10 MG tablet Take 10 mg by mouth daily.   Marland Kitchen esomeprazole (NEXIUM) 40 MG capsule Take 40 mg by mouth.   . estradiol-levonorgestrel (CLIMARA PRO) 0.045-0.015 MG/DAY apply 1 patch every week  .  fluticasone furoate-vilanterol (BREO ELLIPTA) 100-25 MCG/INH AEPB Inhale 1 puff into the lungs daily.  Marland Kitchen gabapentin (NEURONTIN) 100 MG capsule Take 1-3 capsules (100-300 mg total) by mouth at bedtime.  Marland Kitchen ipratropium (ATROVENT) 0.06 % nasal spray Place 2 sprays into both nostrils 4 (four) times daily as needed for rhinitis.  Marland Kitchen losartan (COZAAR) 25 MG tablet Take 25 mg by mouth daily.  Marland Kitchen LUMIGAN 0.01 % SOLN apply 1 drop to into both  eyes at bedtime once daily ONLY  . nicotine polacrilex (NICORETTE) 2 MG gum Take 2 mg by mouth as needed for smoking cessation.  . ondansetron (ZOFRAN) 8 MG tablet Take by mouth every 8 (eight) hours as needed for nausea or vomiting.  . ranitidine (ZANTAC) 150 MG tablet Take 150 mg by mouth daily.  . rosuvastatin (CRESTOR) 10 MG tablet Take 10 mg by  mouth at bedtime.   . VENTOLIN HFA 108 (90 Base) MCG/ACT inhaler inhale 2 puffs by mouth INTO THE LUNGS every 6 hours if needed for wheezing or shortness of breath    ROS  Constitutional: Denies any fever or chills Gastrointestinal: No reported hemesis, hematochezia, vomiting, or acute GI distress Musculoskeletal: Denies any acute onset joint swelling, redness, loss of ROM, or weakness Neurological: No reported episodes of acute onset apraxia, aphasia, dysarthria, agnosia, amnesia, paralysis, loss of coordination, or loss of consciousness  Allergies  Ms. Waterson is allergic to influenza vaccines; ciprofloxacin; flu virus vaccine; sulfa antibiotics; and tetracyclines & related.  [place]  Drug: Ms. Brach  reports that she does not use drugs. Alcohol:  reports that she drinks alcohol. Tobacco:  reports that she quit smoking about 9 years ago. She has a 35.00 pack-year smoking history. She has never used smokeless tobacco. Medical:  has a past medical history of Anemia; Bipolar affective disorder ([place]); Broken foot (2015); Chronic gastritis; Chronic kidney disease; COPD (chronic obstructive pulmonary disease) ([place]);  Dizziness; Dysphagia; Glaucoma; Hyperlipemia; Hypertension; Lithium toxicity (2015); Malignant neoplasm of upper-outer quadrant of female breast [facility]) (May 07, 2007); Neck stiffness; Osteoarthritis; Pancreatitis; Personal history of malignant neoplasm of breast; Pituitary microadenoma ([place]); Recurrent UTI; Renal insufficiency; and Stomach ulcer (2112). Surgical: Ms. Ellingwood  has a past surgical history that includes Colonoscopy (2004); Neck surgery (2006); Eye surgery (2013); Appendectomy; Tubal ligation; Colonoscopy (N/A, 05/14/2015); Esophagogastroduodenoscopy (N/A, 05/14/2015); Savory dilation (N/A, 05/14/2015); ERCP (N/A); Glaucoma surgery (Right, 2013); cataract (Right, 2013); Breast surgery (Left,  2013); Breast surgery (Right, November 06, 1999); Breast surgery (Left, May 07, 2007); EUS (N/A, 2011, 2012); Laparoscopic right colectomy (Right, 06/22/2015); right breast cancer; Trabeculectomy (Left, 03/05/2016); Cataract extraction w/PHACO (Left, 03/05/2016); Breast biopsy (Right, 2011); Breast excisional biopsy (Right, 2001); Breast excisional biopsy (Right, 2008); and Breast excisional biopsy (Left, 10/26/2012). Family: family history includes Breast cancer (age of onset: 19) in her mother.  Constitutional Exam  General appearance: Well nourished, well developed, and well hydrated. In no apparent acute distress Vitals:   07/07/17 1050  BP: (!) 159/65  Pulse: 77  Resp: 16  Temp: 97.8 F (36.6 C)  SpO2: 100%  Weight: 103 lb (46.7 kg)  Height: 5' 5"  (1.651 m)   BMI Assessment: Estimated body mass index is 17.14 kg/m as calculated from the following:   Height as of this encounter: 5' 5"  (1.651 m).   Weight as of this encounter: 103 lb (46.7 kg).  BMI interpretation table: BMI level Category Range association with higher incidence of chronic pain  <18 kg/m2 Underweight   18.5-24.9 kg/m2 Ideal body weight   25-29.9 kg/m2 Overweight Increased incidence by 20%  30-34.9 kg/m2 Obese (Class I)  Increased incidence by 68%  35-39.9 kg/m2 Severe obesity (Class II) Increased incidence by 136%  >40 kg/m2 Extreme obesity (Class III) Increased incidence by 254%   BMI Readings from Last 4 Encounters:  07/07/17 17.14 kg/m  06/15/17 16.97 kg/m  06/10/17 17.51 kg/m  06/09/17 17.07 kg/m   Wt Readings from Last 4 Encounters:  07/07/17 103 lb (46.7 kg)  06/15/17 102 lb (46.3 kg)  06/10/17 102 lb (46.3 kg)  06/09/17 101 lb (45.8 kg)  Psych/Mental status: Alert, oriented x 3 (person, place, & time)       Eyes: PERLA Respiratory: No evidence of acute respiratory distress  Cervical Spine Exam  Inspection: No masses, redness, or swelling Alignment: Symmetrical Functional ROM: Unrestricted ROM      Stability: No instability detected  Muscle strength & Tone: Functionally intact Sensory: Unimpaired Palpation: No palpable anomalies              Upper Extremity (UE) Exam    Side: Right upper extremity  Side: Left upper extremity  Inspection: No masses, redness, swelling, or asymmetry. No contractures  Inspection: No masses, redness, swelling, or asymmetry. No contractures  Functional ROM: Unrestricted ROM          Functional ROM: Unrestricted ROM          Muscle strength & Tone: Functionally intact  Muscle strength & Tone: Functionally intact  Sensory: Unimpaired  Sensory: Unimpaired  Palpation: No palpable anomalies              Palpation: No palpable anomalies              Specialized Test(s): Deferred         Specialized Test(s): Deferred          Thoracic Spine Exam  Inspection: No masses, redness, or swelling Alignment: Symmetrical Functional ROM: Unrestricted ROM Stability: No instability detected Sensory: Unimpaired Muscle strength & Tone: No palpable anomalies  Lumbar Spine Exam  Inspection: No masses, redness, or swelling Alignment: Symmetrical Functional ROM: Unrestricted ROM      Stability: No instability detected Muscle strength & Tone: Functionally intact Sensory:  Unimpaired Palpation: No palpable anomalies       Provocative Tests: Lumbar Hyperextension and rotation test: evaluation deferred today       Patrick's Maneuver: evaluation deferred today                    Gait & Posture Assessment  Ambulation: Unassisted Gait: Relatively normal for age and body habitus Posture: WNL   Lower Extremity Exam    Side: Right lower extremity  Side: Left lower extremity  Inspection: No masses, redness, swelling, or asymmetry. No contractures  Inspection: No masses, redness, swelling, or asymmetry. No contractures  Functional ROM: Unrestricted ROM          Functional ROM: Unrestricted ROM          Muscle strength & Tone: Functionally intact  Muscle strength & Tone: Functionally intact  Sensory: Unimpaired  Sensory: Unimpaired  Palpation: No palpable anomalies  Palpation: No palpable anomalies   Assessment  Primary Diagnosis & Pertinent Problem List: The primary encounter diagnosis was Chronic lumbar radiculopathy (Bilateral) (L>R) (left L5). 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  Status Diagnosis  Controlled Controlled Controlled 1. Chronic lumbar radiculopathy (Bilateral) (L>R) (left L5)   2. Grade 1 Anterolisthesis of L4 over L5 and L5 over S1   3. 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 Weakness of both lower extremities   6. Impairment of balance     Problems updated and reviewed during this visit: Problem  Impairment of Balance   Plan of Care  Pharmacotherapy (Medications Ordered): No orders of the defined types were placed in this encounter.  New Prescriptions   No medications on file   Medications administered today: Ms. Ra had no  medications administered during this visit.  Lab-work, procedure(s), and/or referral(s): Orders Placed This Encounter  Procedures  . Lumbar Epidural Injection  . Lumbar Transforaminal Epidural  . Ambulatory referral to  Physical Therapy    therapies: Planned, scheduled, and/or pending:   Diagnostic left L4-5 interlaminar lumbar epidural steroid injection + left L5-S1 transforaminal #2 epidural steroid injection under fluoroscopic guidance and IV sedation    Considering:   Diagnostic left cervical epidural steroid injectionunder fluoroscopic guidance  Diagnostic left L4-5 interlaminar lumbar epidural steroid injection Diagnostic left L5-S1 transforaminal epidural steroid injection  Diagnostic bilateral lumbar facet block  Possible bilateral lumbar facet RFA    Palliative PRN treatment(s):   Palliative left cervical epidural steroid injectionunder fluoroscopic guidance  Palliative left L4-5 interlaminar lumbar epidural steroid injection + left L5-S1 transforaminal   Provider-requested follow-up: Return for procedure (w/ sedation), by MD.  Future Appointments Date Time Provider [place]  07/15/2017 8:15 AM Milinda Pointer, MD [facility] None  07/20/2017 1:30 PM Robert Bellow, MD [facility] None  07/24/2017 2:00 PM Hollice Espy, MD [facility] None  02/10/2018 1:30 PM Dohmeier, Asencion Partridge, MD [facility] None   Primary Care Physician: Idelle Crouch, MD Location: [facility] Note by: Gaspar Cola, MD Date: 07/07/2017; Time: 7:16 PM  Patient Instructions   ____________________________________________________________________________________________  Preparing for Procedure with Sedation Instructions: . Oral Intake: Do not eat or drink anything for at least 8 hours prior to your procedure. . Transportation: Public transportation is not allowed. Bring an adult driver. The driver must be physically present in our waiting room before any  procedure can be started. Marland Kitchen Physical Assistance: Bring an adult physically capable of assisting you, in the event you need help. This adult should keep you company at home for at least 6 hours after the procedure. . Blood Pressure Medicine: Take your blood pressure medicine with a sip of water the morning of the procedure. . Blood thinners:  . Diabetics on insulin: Notify the staff so that you can be scheduled 1st case in the morning. If your diabetes requires high dose insulin, take only  of your normal insulin dose the morning of the procedure and notify the staff that you have done so. . Preventing infections: Shower with an antibacterial soap the morning of your procedure. . Build-up your immune system: Take 1000 mg of Vitamin C with every meal (3 times a day) the day prior to your procedure. Marland Kitchen Antibiotics: Inform the staff if you have a condition or reason that requires you to take antibiotics before dental procedures. . Pregnancy: If you are pregnant, call and cancel the procedure. . Sickness: If you have a cold, fever, or any active infections, call and cancel the procedure. . Arrival: You must be in the facility at least 30 minutes prior to your scheduled procedure. . Children: Do not bring children with you. . Dress appropriately: Bring dark clothing that you would not mind if they get stained. . Valuables: Do not bring any jewelry or valuables. Procedure appointments are reserved for interventional treatments only. Marland Kitchen No Prescription Refills. . No medication changes will be discussed during procedure appointments. . No disability issues will be discussed. ____________________________________________________________________________________________  GENERAL RISKS AND COMPLICATIONS  What are the risk, side effects and possible complications? Generally speaking, most procedures are safe.  However, with any procedure there are risks, side effects, and the possibility of complications.   The risks and complications are dependent upon the sites that are lesioned, or the type of nerve block to be performed.  The closer the procedure is to the spine, the more serious the risks are.  Great care is taken when placing the radio frequency needles, block  needles or lesioning probes, but sometimes complications can occur. 1. Infection: Any time there is an injection through the skin, there is a risk of infection.  This is why sterile conditions are used for these blocks.  There are four possible types of infection. 1. Localized skin infection. 2. Central Nervous System Infection-This can be in the form of Meningitis, which can be deadly. 3. Epidural Infections-This can be in the form of an epidural abscess, which can cause pressure inside of the spine, causing compression of the spinal cord with subsequent paralysis. This would require an emergency surgery to decompress, and there are no guarantees that the patient would recover from the paralysis. 4. Discitis-This is an infection of the intervertebral discs.  It occurs in about 1% of discography procedures.  It is difficult to treat and it may lead to surgery.        2. Pain: the needles have to go through skin and soft tissues, will cause soreness.       3. Damage to internal structures:  The nerves to be lesioned may be near blood vessels or    other nerves which can be potentially damaged.       4. Bleeding: Bleeding is more common if the patient is taking blood thinners such as  aspirin, Coumadin, Ticiid, Plavix, etc., or if he/she have some genetic predisposition  such as hemophilia. Bleeding into the spinal canal can cause compression of the spinal  cord with subsequent paralysis.  This would require an emergency surgery to  decompress and there are no guarantees that the patient would recover from the  paralysis.       5. Pneumothorax:  Puncturing of a lung is a possibility, every time a needle is introduced in  the area of the chest or  upper back.  Pneumothorax refers to free air around the  collapsed lung(s), inside of the thoracic cavity (chest cavity).  Another two possible  complications related to a similar event would include: Hemothorax and Chylothorax.   These are variations of the Pneumothorax, where instead of air around the collapsed  lung(s), you may have blood or chyle, respectively.       6. Spinal headaches: They may occur with any procedures in the area of the spine.       7. Persistent CSF (Cerebro-Spinal Fluid) leakage: This is a rare problem, but may occur  with prolonged intrathecal or epidural catheters either due to the formation of a fistulous  track or a dural tear.       8. Nerve damage: By working so close to the spinal cord, there is always a possibility of  nerve damage, which could be as serious as a permanent spinal cord injury with  paralysis.       9. Death:  Although rare, severe deadly allergic reactions known as "Anaphylactic  reaction" can occur to any of the medications used.      10. Worsening of the symptoms:  We can always make thing worse.  What are the chances of something like this happening? Chances of any of this occuring are extremely low.  By statistics, you have more of a chance of getting killed in a motor vehicle accident: while driving to the hospital than any of the above occurring .  Nevertheless, you should be aware that they are possibilities.  In general, it is similar to taking a shower.  Everybody knows that you can slip, hit your head and get killed.  Does that  mean that you should not shower again?  Nevertheless always keep in mind that statistics do not mean anything if you happen to be on the wrong side of them.  Even if a procedure has a 1 (one) in a 1,000,000 (million) chance of going wrong, it you happen to be that one..Also, keep in mind that by statistics, you have more of a chance of having something go wrong when taking medications.  Who should not have this  procedure? If you are on a blood thinning medication (e.g. Coumadin, Plavix, see list of "Blood Thinners"), or if you have an active infection going on, you should not have the procedure.  If you are taking any blood thinners, please inform your physician.  How should I prepare for this procedure?  Do not eat or drink anything at least six hours prior to the procedure.  Bring a driver with you .  It cannot be a taxi.  Come accompanied by an adult that can drive you back, and that is strong enough to help you if your legs get weak or numb from the local anesthetic.  Take all of your medicines the morning of the procedure with just enough water to swallow them.  If you have diabetes, make sure that you are scheduled to have your procedure done first thing in the morning, whenever possible.  If you have diabetes, take only half of your insulin dose and notify our nurse that you have done so as soon as you arrive at the clinic.  If you are diabetic, but only take blood sugar pills (oral hypoglycemic), then do not take them on the morning of your procedure.  You may take them after you have had the procedure.  Do not take aspirin or any aspirin-containing medications, at least eleven (11) days prior to the procedure.  They may prolong bleeding.  Wear loose fitting clothing that may be easy to take off and that you would not mind if it got stained with Betadine or blood.  Do not wear any jewelry or perfume  Remove any nail coloring.  It will interfere with some of our monitoring equipment.  NOTE: Remember that this is not meant to be interpreted as a complete list of all possible complications.  Unforeseen problems may occur.  BLOOD THINNERS The following drugs contain aspirin or other products, which can cause increased bleeding during surgery and should not be taken for 2 weeks prior to and 1 week after surgery.  If you should need take something for relief of minor pain, you may take  acetaminophen which is found in Tylenol,m Datril, Anacin-3 and Panadol. It is not blood thinner. The products listed below are.  Do not take any of the products listed below in addition to any listed on your instruction sheet.  A.P.C or A.P.C with Codeine Codeine Phosphate Capsules #3 Ibuprofen Ridaura  ABC compound Congesprin Imuran rimadil  Advil Cope Indocin Robaxisal  Alka-Seltzer Effervescent Pain Reliever and Antacid Coricidin or Coricidin-D  Indomethacin Rufen  Alka-Seltzer plus Cold Medicine Cosprin Ketoprofen S-A-C Tablets  Anacin Analgesic Tablets or Capsules Coumadin Korlgesic Salflex  Anacin Extra Strength Analgesic tablets or capsules CP-2 Tablets Lanoril Salicylate  Anaprox Cuprimine Capsules Levenox Salocol  Anexsia-D Dalteparin Magan Salsalate  Anodynos Darvon compound Magnesium Salicylate Sine-off  Ansaid Dasin Capsules Magsal Sodium Salicylate  Anturane Depen Capsules Marnal Soma  APF Arthritis pain formula Dewitt's Pills Measurin Stanback  Argesic Dia-Gesic Meclofenamic Sulfinpyrazone  Arthritis Bayer Timed Release Aspirin Diclofenac Meclomen Sulindac  Arthritis pain formula Anacin Dicumarol Medipren Supac  Analgesic (Safety coated) Arthralgen Diffunasal Mefanamic Suprofen  Arthritis Strength Bufferin Dihydrocodeine Mepro Compound Suprol  Arthropan liquid Dopirydamole Methcarbomol with Aspirin Synalgos  ASA tablets/Enseals Disalcid Micrainin Tagament  Ascriptin Doan's Midol Talwin  Ascriptin A/D Dolene Mobidin Tanderil  Ascriptin Extra Strength Dolobid Moblgesic Ticlid  Ascriptin with Codeine Doloprin or Doloprin with Codeine Momentum Tolectin  Asperbuf Duoprin Mono-gesic Trendar  Aspergum Duradyne Motrin or Motrin IB Triminicin  Aspirin plain, buffered or enteric coated Durasal Myochrisine Trigesic  Aspirin Suppositories Easprin Nalfon Trillsate  Aspirin with Codeine Ecotrin Regular or Extra Strength Naprosyn Uracel  Atromid-S Efficin Naproxen Ursinus  Auranofin  Capsules Elmiron Neocylate Vanquish  Axotal Emagrin Norgesic Verin  Azathioprine Empirin or Empirin with Codeine Normiflo Vitamin E  Azolid Emprazil Nuprin Voltaren  Bayer Aspirin plain, buffered or children's or timed BC Tablets or powders Encaprin Orgaran Warfarin Sodium  Buff-a-Comp Enoxaparin Orudis Zorpin  Buff-a-Comp with Codeine Equegesic Os-Cal-Gesic   Buffaprin Excedrin plain, buffered or Extra Strength Oxalid   Bufferin Arthritis Strength Feldene Oxphenbutazone   Bufferin plain or Extra Strength Feldene Capsules Oxycodone with Aspirin   Bufferin with Codeine Fenoprofen Fenoprofen Pabalate or Pabalate-SF   Buffets II Flogesic Panagesic   Buffinol plain or Extra Strength Florinal or Florinal with Codeine Panwarfarin   Buf-Tabs Flurbiprofen Penicillamine   Butalbital Compound Four-way cold tablets Penicillin   Butazolidin Fragmin Pepto-Bismol   Carbenicillin Geminisyn Percodan   Carna Arthritis Reliever Geopen Persantine   Carprofen Gold's salt Persistin   Chloramphenicol Goody's Phenylbutazone   Chloromycetin Haltrain Piroxlcam   Clmetidine heparin Plaquenil   Cllnoril Hyco-pap Ponstel   Clofibrate Hydroxy chloroquine Propoxyphen         Before stopping any of these medications, be sure to consult the physician who ordered them.  Some, such as Coumadin (Warfarin) are ordered to prevent or treat serious conditions such as "deep thrombosis", "pumonary embolisms", and other heart problems.  The amount of time that you may need off of the medication may also vary with the medication and the reason for which you were taking it.  If you are taking any of these medications, please make sure you notify your pain physician before you undergo any procedures.         Epidural Steroid Injection Patient Information  Description: The epidural space surrounds the nerves as they exit the spinal cord.  In some patients, the nerves can be compressed and inflamed by a bulging disc or a  tight spinal canal (spinal stenosis).  By injecting steroids into the epidural space, we can bring irritated nerves into direct contact with a potentially helpful medication.  These steroids act directly on the irritated nerves and can reduce swelling and inflammation which often leads to decreased pain.  Epidural steroids may be injected anywhere along the spine and from the neck to the low back depending upon the location of your pain.   After numbing the skin with local anesthetic (like Novocaine), a small needle is passed into the epidural space slowly.  You may experience a sensation of pressure while this is being done.  The entire block usually last less than 10 minutes.  Conditions which may be treated by epidural steroids:   Low back and leg pain  Neck and arm pain  Spinal stenosis  Post-laminectomy syndrome  Herpes zoster (shingles) pain  Pain from compression fractures  Preparation for the injection:  1. Do not eat any solid food or dairy products within 8 hours  of your appointment.  2. You may drink clear liquids up to 3 hours before appointment.  Clear liquids include water, black coffee, juice or soda.  No milk or cream please. 3. You may take your regular medication, including pain medications, with a sip of water before your appointment  Diabetics should hold regular insulin (if taken separately) and take 1/2 normal NPH dos the morning of the procedure.  Carry some sugar containing items with you to your appointment. 4. A driver must accompany you and be prepared to drive you home after your procedure.  5. Bring all your current medications with your. 6. An IV may be inserted and sedation may be given at the discretion of the physician.   7. A blood pressure cuff, EKG and other monitors will often be applied during the procedure.  Some patients may need to have extra oxygen administered for a short period. 8. You will be asked to provide medical information, including your  allergies, prior to the procedure.  We must know immediately if you are taking blood thinners (like Coumadin/Warfarin)  Or if you are allergic to IV iodine contrast (dye). We must know if you could possible be pregnant.  Possible side-effects:  Bleeding from needle site  Infection (rare, may require surgery)  Nerve injury (rare)  Numbness & tingling (temporary)  Difficulty urinating (rare, temporary)  Spinal headache ( a headache worse with upright posture)  Light -headedness (temporary)  Pain at injection site (several days)  Decreased blood pressure (temporary)  Weakness in arm/leg (temporary)  Pressure sensation in back/neck (temporary)  Call if you experience:  Fever/chills associated with headache or increased back/neck pain.  Headache worsened by an upright position.  New onset weakness or numbness of an extremity below the injection site  Hives or difficulty breathing (go to the emergency room)  Inflammation or drainage at the infection site  Severe back/neck pain  Any new symptoms which are concerning to you  Please note:  Although the local anesthetic injected can often make your back or neck feel good for several hours after the injection, the pain will likely return.  It takes 3-7 days for steroids to work in the epidural space.  You may not notice any pain relief for at least that one week.  If effective, we will often do a series of three injections spaced 3-6 weeks apart to maximally decrease your pain.  After the initial series, we generally will wait several months before considering a repeat injection of the same type.  If you have any questions, please call [phone] [facility]

## 2017-07-07 ENCOUNTER — Ambulatory Visit: Payer: Medicare Other | Attending: Pain Medicine | Admitting: Pain Medicine

## 2017-07-07 ENCOUNTER — Encounter: Payer: Self-pay | Admitting: Pain Medicine

## 2017-07-07 VITALS — BP 159/65 | HR 77 | Temp 97.8°F | Resp 16 | Ht 65.0 in | Wt 103.0 lb

## 2017-07-07 DIAGNOSIS — M19011 Primary osteoarthritis, right shoulder: Secondary | ICD-10-CM | POA: Insufficient documentation

## 2017-07-07 DIAGNOSIS — G608 Other hereditary and idiopathic neuropathies: Secondary | ICD-10-CM | POA: Diagnosis not present

## 2017-07-07 DIAGNOSIS — Z86010 Personal history of colonic polyps: Secondary | ICD-10-CM | POA: Insufficient documentation

## 2017-07-07 DIAGNOSIS — M48062 Spinal stenosis, lumbar region with neurogenic claudication: Secondary | ICD-10-CM

## 2017-07-07 DIAGNOSIS — Z87891 Personal history of nicotine dependence: Secondary | ICD-10-CM | POA: Insufficient documentation

## 2017-07-07 DIAGNOSIS — R2689 Other abnormalities of gait and mobility: Secondary | ICD-10-CM | POA: Insufficient documentation

## 2017-07-07 DIAGNOSIS — M531 Cervicobrachial syndrome: Secondary | ICD-10-CM | POA: Insufficient documentation

## 2017-07-07 DIAGNOSIS — Z887 Allergy status to serum and vaccine status: Secondary | ICD-10-CM | POA: Insufficient documentation

## 2017-07-07 DIAGNOSIS — I129 Hypertensive chronic kidney disease with stage 1 through stage 4 chronic kidney disease, or unspecified chronic kidney disease: Secondary | ICD-10-CM | POA: Insufficient documentation

## 2017-07-07 DIAGNOSIS — C50411 Malignant neoplasm of upper-outer quadrant of right female breast: Secondary | ICD-10-CM | POA: Insufficient documentation

## 2017-07-07 DIAGNOSIS — Z87440 Personal history of urinary (tract) infections: Secondary | ICD-10-CM | POA: Diagnosis not present

## 2017-07-07 DIAGNOSIS — Z79899 Other long term (current) drug therapy: Secondary | ICD-10-CM | POA: Insufficient documentation

## 2017-07-07 DIAGNOSIS — Z7951 Long term (current) use of inhaled steroids: Secondary | ICD-10-CM | POA: Diagnosis not present

## 2017-07-07 DIAGNOSIS — N189 Chronic kidney disease, unspecified: Secondary | ICD-10-CM | POA: Diagnosis not present

## 2017-07-07 DIAGNOSIS — G894 Chronic pain syndrome: Secondary | ICD-10-CM | POA: Insufficient documentation

## 2017-07-07 DIAGNOSIS — M4310 Spondylolisthesis, site unspecified: Secondary | ICD-10-CM | POA: Diagnosis not present

## 2017-07-07 DIAGNOSIS — M21372 Foot drop, left foot: Secondary | ICD-10-CM | POA: Insufficient documentation

## 2017-07-07 DIAGNOSIS — E785 Hyperlipidemia, unspecified: Secondary | ICD-10-CM | POA: Insufficient documentation

## 2017-07-07 DIAGNOSIS — M4804 Spinal stenosis, thoracic region: Secondary | ICD-10-CM | POA: Insufficient documentation

## 2017-07-07 DIAGNOSIS — M4316 Spondylolisthesis, lumbar region: Secondary | ICD-10-CM | POA: Insufficient documentation

## 2017-07-07 DIAGNOSIS — M5116 Intervertebral disc disorders with radiculopathy, lumbar region: Secondary | ICD-10-CM | POA: Diagnosis not present

## 2017-07-07 DIAGNOSIS — R1013 Epigastric pain: Secondary | ICD-10-CM | POA: Insufficient documentation

## 2017-07-07 DIAGNOSIS — F319 Bipolar disorder, unspecified: Secondary | ICD-10-CM | POA: Insufficient documentation

## 2017-07-07 DIAGNOSIS — R29898 Other symptoms and signs involving the musculoskeletal system: Secondary | ICD-10-CM

## 2017-07-07 DIAGNOSIS — Z7982 Long term (current) use of aspirin: Secondary | ICD-10-CM | POA: Diagnosis not present

## 2017-07-07 DIAGNOSIS — M5010 Cervical disc disorder with radiculopathy, unspecified cervical region: Secondary | ICD-10-CM | POA: Diagnosis not present

## 2017-07-07 DIAGNOSIS — J449 Chronic obstructive pulmonary disease, unspecified: Secondary | ICD-10-CM | POA: Diagnosis not present

## 2017-07-07 DIAGNOSIS — M5416 Radiculopathy, lumbar region: Secondary | ICD-10-CM

## 2017-07-07 DIAGNOSIS — Z882 Allergy status to sulfonamides status: Secondary | ICD-10-CM | POA: Diagnosis not present

## 2017-07-07 DIAGNOSIS — M1990 Unspecified osteoarthritis, unspecified site: Secondary | ICD-10-CM | POA: Insufficient documentation

## 2017-07-07 DIAGNOSIS — M545 Low back pain: Secondary | ICD-10-CM | POA: Diagnosis present

## 2017-07-07 DIAGNOSIS — R531 Weakness: Secondary | ICD-10-CM | POA: Insufficient documentation

## 2017-07-07 DIAGNOSIS — Z9049 Acquired absence of other specified parts of digestive tract: Secondary | ICD-10-CM | POA: Insufficient documentation

## 2017-07-07 DIAGNOSIS — M19012 Primary osteoarthritis, left shoulder: Secondary | ICD-10-CM | POA: Insufficient documentation

## 2017-07-07 DIAGNOSIS — Z803 Family history of malignant neoplasm of breast: Secondary | ICD-10-CM | POA: Diagnosis not present

## 2017-07-07 DIAGNOSIS — Z881 Allergy status to other antibiotic agents status: Secondary | ICD-10-CM | POA: Diagnosis not present

## 2017-07-07 DIAGNOSIS — D649 Anemia, unspecified: Secondary | ICD-10-CM | POA: Insufficient documentation

## 2017-07-07 DIAGNOSIS — M48061 Spinal stenosis, lumbar region without neurogenic claudication: Secondary | ICD-10-CM | POA: Diagnosis not present

## 2017-07-07 DIAGNOSIS — R278 Other lack of coordination: Secondary | ICD-10-CM | POA: Insufficient documentation

## 2017-07-07 DIAGNOSIS — D352 Benign neoplasm of pituitary gland: Secondary | ICD-10-CM | POA: Insufficient documentation

## 2017-07-07 NOTE — Patient Instructions (Addendum)
____________________________________________________________________________________________  Preparing for Procedure with Sedation Instructions: . Oral Intake: Do not eat or drink anything for at least 8 hours prior to your procedure. . Transportation: Public transportation is not allowed. Bring an adult driver. The driver must be physically present in our waiting room before any procedure can be started. . Physical Assistance: Bring an adult physically capable of assisting you, in the event you need help. This adult should keep you company at home for at least 6 hours after the procedure. . Blood Pressure Medicine: Take your blood pressure medicine with a sip of water the morning of the procedure. . Blood thinners:  . Diabetics on insulin: Notify the staff so that you can be scheduled 1st case in the morning. If your diabetes requires high dose insulin, take only  of your normal insulin dose the morning of the procedure and notify the staff that you have done so. . Preventing infections: Shower with an antibacterial soap the morning of your procedure. . Build-up your immune system: Take 1000 mg of Vitamin C with every meal (3 times a day) the day prior to your procedure. . Antibiotics: Inform the staff if you have a condition or reason that requires you to take antibiotics before dental procedures. . Pregnancy: If you are pregnant, call and cancel the procedure. . Sickness: If you have a cold, fever, or any active infections, call and cancel the procedure. . Arrival: You must be in the facility at least 30 minutes prior to your scheduled procedure. . Children: Do not bring children with you. . Dress appropriately: Bring dark clothing that you would not mind if they get stained. . Valuables: Do not bring any jewelry or valuables. Procedure appointments are reserved for interventional treatments only. . No Prescription Refills. . No medication changes will be discussed during procedure  appointments. . No disability issues will be discussed. ____________________________________________________________________________________________  GENERAL RISKS AND COMPLICATIONS  What are the risk, side effects and possible complications? Generally speaking, most procedures are safe.  However, with any procedure there are risks, side effects, and the possibility of complications.  The risks and complications are dependent upon the sites that are lesioned, or the type of nerve block to be performed.  The closer the procedure is to the spine, the more serious the risks are.  Great care is taken when placing the radio frequency needles, block needles or lesioning probes, but sometimes complications can occur. 1. Infection: Any time there is an injection through the skin, there is a risk of infection.  This is why sterile conditions are used for these blocks.  There are four possible types of infection. 1. Localized skin infection. 2. Central Nervous System Infection-This can be in the form of Meningitis, which can be deadly. 3. Epidural Infections-This can be in the form of an epidural abscess, which can cause pressure inside of the spine, causing compression of the spinal cord with subsequent paralysis. This would require an emergency surgery to decompress, and there are no guarantees that the patient would recover from the paralysis. 4. Discitis-This is an infection of the intervertebral discs.  It occurs in about 1% of discography procedures.  It is difficult to treat and it may lead to surgery.        2. Pain: the needles have to go through skin and soft tissues, will cause soreness.       3. Damage to internal structures:  The nerves to be lesioned may be near blood vessels or      other nerves which can be potentially damaged.       4. Bleeding: Bleeding is more common if the patient is taking blood thinners such as  aspirin, Coumadin, Ticiid, Plavix, etc., or if he/she have some genetic  predisposition  such as hemophilia. Bleeding into the spinal canal can cause compression of the spinal  cord with subsequent paralysis.  This would require an emergency surgery to  decompress and there are no guarantees that the patient would recover from the  paralysis.       5. Pneumothorax:  Puncturing of a lung is a possibility, every time a needle is introduced in  the area of the chest or upper back.  Pneumothorax refers to free air around the  collapsed lung(s), inside of the thoracic cavity (chest cavity).  Another two possible  complications related to a similar event would include: Hemothorax and Chylothorax.   These are variations of the Pneumothorax, where instead of air around the collapsed  lung(s), you may have blood or chyle, respectively.       6. Spinal headaches: They may occur with any procedures in the area of the spine.       7. Persistent CSF (Cerebro-Spinal Fluid) leakage: This is a rare problem, but may occur  with prolonged intrathecal or epidural catheters either due to the formation of a fistulous  track or a dural tear.       8. Nerve damage: By working so close to the spinal cord, there is always a possibility of  nerve damage, which could be as serious as a permanent spinal cord injury with  paralysis.       9. Death:  Although rare, severe deadly allergic reactions known as "Anaphylactic  reaction" can occur to any of the medications used.      10. Worsening of the symptoms:  We can always make thing worse.  What are the chances of something like this happening? Chances of any of this occuring are extremely low.  By statistics, you have more of a chance of getting killed in a motor vehicle accident: while driving to the hospital than any of the above occurring .  Nevertheless, you should be aware that they are possibilities.  In general, it is similar to taking a shower.  Everybody knows that you can slip, hit your head and get killed.  Does that mean that you should not  shower again?  Nevertheless always keep in mind that statistics do not mean anything if you happen to be on the wrong side of them.  Even if a procedure has a 1 (one) in a 1,000,000 (million) chance of going wrong, it you happen to be that one..Also, keep in mind that by statistics, you have more of a chance of having something go wrong when taking medications.  Who should not have this procedure? If you are on a blood thinning medication (e.g. Coumadin, Plavix, see list of "Blood Thinners"), or if you have an active infection going on, you should not have the procedure.  If you are taking any blood thinners, please inform your physician.  How should I prepare for this procedure?  Do not eat or drink anything at least six hours prior to the procedure.  Bring a driver with you .  It cannot be a taxi.  Come accompanied by an adult that can drive you back, and that is strong enough to help you if your legs get weak or numb from the local anesthetic.  Take   all of your medicines the morning of the procedure with just enough water to swallow them.  If you have diabetes, make sure that you are scheduled to have your procedure done first thing in the morning, whenever possible.  If you have diabetes, take only half of your insulin dose and notify our nurse that you have done so as soon as you arrive at the clinic.  If you are diabetic, but only take blood sugar pills (oral hypoglycemic), then do not take them on the morning of your procedure.  You may take them after you have had the procedure.  Do not take aspirin or any aspirin-containing medications, at least eleven (11) days prior to the procedure.  They may prolong bleeding.  Wear loose fitting clothing that may be easy to take off and that you would not mind if it got stained with Betadine or blood.  Do not wear any jewelry or perfume  Remove any nail coloring.  It will interfere with some of our monitoring equipment.  NOTE: Remember that  this is not meant to be interpreted as a complete list of all possible complications.  Unforeseen problems may occur.  BLOOD THINNERS The following drugs contain aspirin or other products, which can cause increased bleeding during surgery and should not be taken for 2 weeks prior to and 1 week after surgery.  If you should need take something for relief of minor pain, you may take acetaminophen which is found in Tylenol,m Datril, Anacin-3 and Panadol. It is not blood thinner. The products listed below are.  Do not take any of the products listed below in addition to any listed on your instruction sheet.  A.P.C or A.P.C with Codeine Codeine Phosphate Capsules #3 Ibuprofen Ridaura  ABC compound Congesprin Imuran rimadil  Advil Cope Indocin Robaxisal  Alka-Seltzer Effervescent Pain Reliever and Antacid Coricidin or Coricidin-D  Indomethacin Rufen  Alka-Seltzer plus Cold Medicine Cosprin Ketoprofen S-A-C Tablets  Anacin Analgesic Tablets or Capsules Coumadin Korlgesic Salflex  Anacin Extra Strength Analgesic tablets or capsules CP-2 Tablets Lanoril Salicylate  Anaprox Cuprimine Capsules Levenox Salocol  Anexsia-D Dalteparin Magan Salsalate  Anodynos Darvon compound Magnesium Salicylate Sine-off  Ansaid Dasin Capsules Magsal Sodium Salicylate  Anturane Depen Capsules Marnal Soma  APF Arthritis pain formula Dewitt's Pills Measurin Stanback  Argesic Dia-Gesic Meclofenamic Sulfinpyrazone  Arthritis Bayer Timed Release Aspirin Diclofenac Meclomen Sulindac  Arthritis pain formula Anacin Dicumarol Medipren Supac  Analgesic (Safety coated) Arthralgen Diffunasal Mefanamic Suprofen  Arthritis Strength Bufferin Dihydrocodeine Mepro Compound Suprol  Arthropan liquid Dopirydamole Methcarbomol with Aspirin Synalgos  ASA tablets/Enseals Disalcid Micrainin Tagament  Ascriptin Doan's Midol Talwin  Ascriptin A/D Dolene Mobidin Tanderil  Ascriptin Extra Strength Dolobid Moblgesic Ticlid  Ascriptin with Codeine  Doloprin or Doloprin with Codeine Momentum Tolectin  Asperbuf Duoprin Mono-gesic Trendar  Aspergum Duradyne Motrin or Motrin IB Triminicin  Aspirin plain, buffered or enteric coated Durasal Myochrisine Trigesic  Aspirin Suppositories Easprin Nalfon Trillsate  Aspirin with Codeine Ecotrin Regular or Extra Strength Naprosyn Uracel  Atromid-S Efficin Naproxen Ursinus  Auranofin Capsules Elmiron Neocylate Vanquish  Axotal Emagrin Norgesic Verin  Azathioprine Empirin or Empirin with Codeine Normiflo Vitamin E  Azolid Emprazil Nuprin Voltaren  Bayer Aspirin plain, buffered or children's or timed BC Tablets or powders Encaprin Orgaran Warfarin Sodium  Buff-a-Comp Enoxaparin Orudis Zorpin  Buff-a-Comp with Codeine Equegesic Os-Cal-Gesic   Buffaprin Excedrin plain, buffered or Extra Strength Oxalid   Bufferin Arthritis Strength Feldene Oxphenbutazone   Bufferin plain or Extra Strength Feldene Capsules   Oxycodone with Aspirin   Bufferin with Codeine Fenoprofen Fenoprofen Pabalate or Pabalate-SF   Buffets II Flogesic Panagesic   Buffinol plain or Extra Strength Florinal or Florinal with Codeine Panwarfarin   Buf-Tabs Flurbiprofen Penicillamine   Butalbital Compound Four-way cold tablets Penicillin   Butazolidin Fragmin Pepto-Bismol   Carbenicillin Geminisyn Percodan   Carna Arthritis Reliever Geopen Persantine   Carprofen Gold's salt Persistin   Chloramphenicol Goody's Phenylbutazone   Chloromycetin Haltrain Piroxlcam   Clmetidine heparin Plaquenil   Cllnoril Hyco-pap Ponstel   Clofibrate Hydroxy chloroquine Propoxyphen         Before stopping any of these medications, be sure to consult the physician who ordered them.  Some, such as Coumadin (Warfarin) are ordered to prevent or treat serious conditions such as "deep thrombosis", "pumonary embolisms", and other heart problems.  The amount of time that you may need off of the medication may also vary with the medication and the reason for which  you were taking it.  If you are taking any of these medications, please make sure you notify your pain physician before you undergo any procedures.         Epidural Steroid Injection Patient Information  Description: The epidural space surrounds the nerves as they exit the spinal cord.  In some patients, the nerves can be compressed and inflamed by a bulging disc or a tight spinal canal (spinal stenosis).  By injecting steroids into the epidural space, we can bring irritated nerves into direct contact with a potentially helpful medication.  These steroids act directly on the irritated nerves and can reduce swelling and inflammation which often leads to decreased pain.  Epidural steroids may be injected anywhere along the spine and from the neck to the low back depending upon the location of your pain.   After numbing the skin with local anesthetic (like Novocaine), a small needle is passed into the epidural space slowly.  You may experience a sensation of pressure while this is being done.  The entire block usually last less than 10 minutes.  Conditions which may be treated by epidural steroids:   Low back and leg pain  Neck and arm pain  Spinal stenosis  Post-laminectomy syndrome  Herpes zoster (shingles) pain  Pain from compression fractures  Preparation for the injection:  1. Do not eat any solid food or dairy products within 8 hours of your appointment.  2. You may drink clear liquids up to 3 hours before appointment.  Clear liquids include water, black coffee, juice or soda.  No milk or cream please. 3. You may take your regular medication, including pain medications, with a sip of water before your appointment  Diabetics should hold regular insulin (if taken separately) and take 1/2 normal NPH dos the morning of the procedure.  Carry some sugar containing items with you to your appointment. 4. A driver must accompany you and be prepared to drive you home after your procedure.   5. Bring all your current medications with your. 6. An IV may be inserted and sedation may be given at the discretion of the physician.   7. A blood pressure cuff, EKG and other monitors will often be applied during the procedure.  Some patients may need to have extra oxygen administered for a short period. 8. You will be asked to provide medical information, including your allergies, prior to the procedure.  We must know immediately if you are taking blood thinners (like Coumadin/Warfarin)  Or if you are allergic to   IV iodine contrast (dye). We must know if you could possible be pregnant.  Possible side-effects:  Bleeding from needle site  Infection (rare, may require surgery)  Nerve injury (rare)  Numbness & tingling (temporary)  Difficulty urinating (rare, temporary)  Spinal headache ( a headache worse with upright posture)  Light -headedness (temporary)  Pain at injection site (several days)  Decreased blood pressure (temporary)  Weakness in arm/leg (temporary)  Pressure sensation in back/neck (temporary)  Call if you experience:  Fever/chills associated with headache or increased back/neck pain.  Headache worsened by an upright position.  New onset weakness or numbness of an extremity below the injection site  Hives or difficulty breathing (go to the emergency room)  Inflammation or drainage at the infection site  Severe back/neck pain  Any new symptoms which are concerning to you  Please note:  Although the local anesthetic injected can often make your back or neck feel good for several hours after the injection, the pain will likely return.  It takes 3-7 days for steroids to work in the epidural space.  You may not notice any pain relief for at least that one week.  If effective, we will often do a series of three injections spaced 3-6 weeks apart to maximally decrease your pain.  After the initial series, we generally will wait several months before  considering a repeat injection of the same type.  If you have any questions, please call [phone] [facility]   

## 2017-07-07 NOTE — Progress Notes (Signed)
Safety precautions to be maintained throughout the outpatient stay will include: orient to surroundings, keep bed in low position, maintain call bell within reach at all times, provide assistance with transfer out of bed and ambulation.  

## 2017-07-14 ENCOUNTER — Ambulatory Visit: Payer: Medicare Other | Admitting: Pain Medicine

## 2017-07-15 ENCOUNTER — Ambulatory Visit (HOSPITAL_BASED_OUTPATIENT_CLINIC_OR_DEPARTMENT_OTHER): Payer: Medicare Other | Admitting: Pain Medicine

## 2017-07-15 ENCOUNTER — Ambulatory Visit
Admission: RE | Admit: 2017-07-15 | Discharge: 2017-07-15 | Disposition: A | Discharge status: Home / Self Care | Payer: Medicare Other | Source: Ambulatory Visit | Attending: Pain Medicine | Admitting: Pain Medicine

## 2017-07-15 ENCOUNTER — Encounter: Payer: Self-pay | Admitting: Pain Medicine

## 2017-07-15 VITALS — BP 121/56 | HR 64 | Temp 97.0°F | Resp 14 | Ht 64.0 in | Wt 100.0 lb

## 2017-07-15 DIAGNOSIS — M48062 Spinal stenosis, lumbar region with neurogenic claudication: Secondary | ICD-10-CM | POA: Diagnosis not present

## 2017-07-15 DIAGNOSIS — M9983 Other biomechanical lesions of lumbar region: Secondary | ICD-10-CM | POA: Insufficient documentation

## 2017-07-15 DIAGNOSIS — M48061 Spinal stenosis, lumbar region without neurogenic claudication: Secondary | ICD-10-CM | POA: Insufficient documentation

## 2017-07-15 DIAGNOSIS — Z881 Allergy status to other antibiotic agents status: Secondary | ICD-10-CM | POA: Insufficient documentation

## 2017-07-15 DIAGNOSIS — R200 Anesthesia of skin: Secondary | ICD-10-CM | POA: Diagnosis not present

## 2017-07-15 DIAGNOSIS — Z853 Personal history of malignant neoplasm of breast: Secondary | ICD-10-CM | POA: Insufficient documentation

## 2017-07-15 DIAGNOSIS — M21372 Foot drop, left foot: Secondary | ICD-10-CM | POA: Diagnosis not present

## 2017-07-15 DIAGNOSIS — R202 Paresthesia of skin: Secondary | ICD-10-CM | POA: Insufficient documentation

## 2017-07-15 DIAGNOSIS — M5416 Radiculopathy, lumbar region: Secondary | ICD-10-CM

## 2017-07-15 DIAGNOSIS — Z9889 Other specified postprocedural states: Secondary | ICD-10-CM | POA: Insufficient documentation

## 2017-07-15 DIAGNOSIS — Z7982 Long term (current) use of aspirin: Secondary | ICD-10-CM | POA: Insufficient documentation

## 2017-07-15 DIAGNOSIS — M545 Low back pain: Secondary | ICD-10-CM | POA: Diagnosis present

## 2017-07-15 DIAGNOSIS — Z79899 Other long term (current) drug therapy: Secondary | ICD-10-CM | POA: Insufficient documentation

## 2017-07-15 DIAGNOSIS — M792 Neuralgia and neuritis, unspecified: Secondary | ICD-10-CM

## 2017-07-15 MED ORDER — ROPIVACAINE HCL 2 MG/ML IJ SOLN
2.0000 mL | Freq: Once | INTRAMUSCULAR | Status: AC
Start: 2017-07-15 — End: 2017-07-15
  Administered 2017-07-15: 1 mL via EPIDURAL
  Filled 2017-07-15: qty 10

## 2017-07-15 MED ORDER — LIDOCAINE HCL (PF) 1.5 % IJ SOLN
20.0000 mL | Freq: Once | INTRAMUSCULAR | Status: DC
  Filled 2017-07-15: qty 20

## 2017-07-15 MED ORDER — ROPIVACAINE HCL 2 MG/ML IJ SOLN
1.0000 mL | Freq: Once | INTRAMUSCULAR | Status: AC
  Administered 2017-07-15: 2 mL via EPIDURAL
  Filled 2017-07-15: qty 10

## 2017-07-15 MED ORDER — SODIUM CHLORIDE 0.9% FLUSH: 2.0000 mL | Freq: Once | INTRAVENOUS | Status: DC

## 2017-07-15 MED ORDER — DEXAMETHASONE SODIUM PHOSPHATE 10 MG/ML IJ SOLN
10.0000 mg | Freq: Once | INTRAMUSCULAR | Status: AC
  Administered 2017-07-15: 10 mg
  Filled 2017-07-15: qty 1

## 2017-07-15 MED ORDER — TRIAMCINOLONE ACETONIDE 40 MG/ML IJ SUSP
40.0000 mg | Freq: Once | INTRAMUSCULAR | Status: AC
  Administered 2017-07-15: 40 mg
  Filled 2017-07-15: qty 1

## 2017-07-15 MED ORDER — SODIUM CHLORIDE 0.9% FLUSH: 1.0000 mL | Freq: Once | INTRAVENOUS | Status: DC

## 2017-07-15 MED ORDER — LACTATED RINGERS IV SOLN
1000.0000 mL | Freq: Once | INTRAVENOUS | Status: AC
  Administered 2017-07-15: 1000 mL via INTRAVENOUS

## 2017-07-15 MED ORDER — SODIUM CHLORIDE 0.9 % IJ SOLN
INTRAMUSCULAR | Status: AC
  Filled 2017-07-15: qty 10

## 2017-07-15 MED ORDER — LIDOCAINE HCL (PF) 1 % IJ SOLN
5.0000 mL | Freq: Once | INTRAMUSCULAR | Status: AC
  Administered 2017-07-15: 5 mL
  Filled 2017-07-15: qty 5

## 2017-07-15 MED ORDER — FENTANYL CITRATE (PF) 100 MCG/2ML IJ SOLN
25.0000 ug | INTRAMUSCULAR | Status: DC | PRN
  Administered 2017-07-15: 50 ug via INTRAVENOUS
  Filled 2017-07-15: qty 2

## 2017-07-15 MED ORDER — GABAPENTIN 100 MG PO CAPS: 100.0000 mg | ORAL_CAPSULE | Freq: Every day | ORAL | 0 refills | Status: DC

## 2017-07-15 MED ORDER — IOPAMIDOL (ISOVUE-M 200) INJECTION 41%
10.0000 mL | Freq: Once | INTRAMUSCULAR | Status: AC
  Administered 2017-07-15: 10 mL via EPIDURAL
  Filled 2017-07-15: qty 10

## 2017-07-15 MED ORDER — MIDAZOLAM HCL 5 MG/5ML IJ SOLN
1.0000 mg | INTRAMUSCULAR | Status: DC | PRN
  Administered 2017-07-15: 2 mg via INTRAVENOUS
  Filled 2017-07-15: qty 5

## 2017-07-15 NOTE — Patient Instructions (Addendum)
____________________________________________________________________________________________  Post-Procedure instructions Instructions:  Apply ice: Fill a plastic sandwich bag with crushed ice. Cover it with a small towel and apply to injection site. Apply for 15 minutes then remove x 15 minutes. Repeat sequence on day of procedure, until you go to bed. The purpose is to minimize swelling and discomfort after procedure.  Apply heat: Apply heat to procedure site starting the day following the procedure. The purpose is to treat any soreness and discomfort from the procedure.  Food intake: Start with clear liquids (like water) and advance to regular food, as tolerated.   Physical activities: Keep activities to a minimum for the first 8 hours after the procedure.   Driving: If you have received any sedation, you are not allowed to drive for 24 hours after your procedure.  Blood thinner: Restart your blood thinner 6 hours after your procedure. (Only for those taking blood thinners)  Insulin: As soon as you can eat, you may resume your normal dosing schedule. (Only for those taking insulin)  Infection prevention: Keep procedure site clean and dry.  Post-procedure Pain Diary: Extremely important that this be done correctly and accurately. Recorded information will be used to determine the next step in treatment.  Pain evaluated is that of treated area only. Do not include pain from an untreated area.  Complete every hour, on the hour, for the initial 8 hours. Set an alarm to help you do this part accurately.  Do not go to sleep and have it completed later. It will not be accurate.  Follow-up appointment: Keep your follow-up appointment after the procedure. Usually 2 weeks for most procedures. (6 weeks in the case of radiofrequency.) Bring you pain diary.  Expect:  From numbing medicine (AKA: Local Anesthetics): Numbness or decrease in pain.  Onset: Full effect within 15 minutes of  injected.  Duration: It will depend on the type of local anesthetic used. On the average, 1 to 8 hours.   From steroids: Decrease in swelling or inflammation. Once inflammation is improved, relief of the pain will follow.  Onset of benefits: Depends on the amount of swelling present. The more swelling, the longer it will take for the benefits to be seen. In some cases, up to 10 days.  Duration: Steroids will stay in the system x 2 weeks. Duration of benefits will depend on multiple posibilities including persistent irritating factors.  From procedure: Some discomfort is to be expected once the numbing medicine wears off. This should be minimal if ice and heat are applied as instructed. Call if:  You experience numbness and weakness that gets worse with time, as opposed to wearing off.  New onset bowel or bladder incontinence. (Spinal procedures only)  Emergency Numbers:  Durning business hours (Monday - Thursday, 8:00 AM - 4:00 PM) (Friday, 9:00 AM - 12:00 Noon): [phone]  After hours: [phone] ____________________________________________________________________________________________  Pain Management Discharge Instructions  General Discharge Instructions :  If you need to reach your doctor call: Monday-Friday 8:00 am - 4:00 pm at [phone] or toll free [phone].  After clinic hours [phone] to have operator reach doctor.  Bring all of your medication bottles to all your appointments in the pain clinic.  To cancel or reschedule your appointment with Pain Management please remember to call 24 hours in advance to avoid a fee.  Refer to the educational materials which you have been given on: General Risks, I had my Procedure. Discharge Instructions, Post Sedation.  Post Procedure Instructions:  The drugs you   were given will stay in your system until tomorrow, so for the next 24 hours you should not drive, make any legal decisions or drink any alcoholic  beverages.  You may eat anything you prefer, but it is better to start with liquids then soups and crackers, and gradually work up to solid foods.  Please notify your doctor immediately if you have any unusual bleeding, trouble breathing or pain that is not related to your normal pain.  Depending on the type of procedure that was done, some parts of your body may feel week and/or numb.  This usually clears up by tonight or the next day.  Walk with the use of an assistive device or accompanied by an adult for the 24 hours.  You may use ice on the affected area for the first 24 hours.  Put ice in a Ziploc bag and cover with a towel and place against area 15 minutes on 15 minutes off.  You may switch to heat after 24 hours.GENERAL RISKS AND COMPLICATIONS  What are the risk, side effects and possible complications? Generally speaking, most procedures are safe.  However, with any procedure there are risks, side effects, and the possibility of complications.  The risks and complications are dependent upon the sites that are lesioned, or the type of nerve block to be performed.  The closer the procedure is to the spine, the more serious the risks are.  Great care is taken when placing the radio frequency needles, block needles or lesioning probes, but sometimes complications can occur. 1. Infection: Any time there is an injection through the skin, there is a risk of infection.  This is why sterile conditions are used for these blocks.  There are four possible types of infection. 1. Localized skin infection. 2. Central Nervous System Infection-This can be in the form of Meningitis, which can be deadly. 3. Epidural Infections-This can be in the form of an epidural abscess, which can cause pressure inside of the spine, causing compression of the spinal cord with subsequent paralysis. This would require an emergency surgery to decompress, and there are no guarantees that the patient would recover from the  paralysis. 4. Discitis-This is an infection of the intervertebral discs.  It occurs in about 1% of discography procedures.  It is difficult to treat and it may lead to surgery.        2. Pain: the needles have to go through skin and soft tissues, will cause soreness.       3. Damage to internal structures:  The nerves to be lesioned may be near blood vessels or    other nerves which can be potentially damaged.       4. Bleeding: Bleeding is more common if the patient is taking blood thinners such as  aspirin, Coumadin, Ticiid, Plavix, etc., or if he/she have some genetic predisposition  such as hemophilia. Bleeding into the spinal canal can cause compression of the spinal  cord with subsequent paralysis.  This would require an emergency surgery to  decompress and there are no guarantees that the patient would recover from the  paralysis.       5. Pneumothorax:  Puncturing of a lung is a possibility, every time a needle is introduced in  the area of the chest or upper back.  Pneumothorax refers to free air around the  collapsed lung(s), inside of the thoracic cavity (chest cavity).  Another two possible  complications related to a similar event would include: Hemothorax and   Chylothorax.   These are variations of the Pneumothorax, where instead of air around the collapsed  lung(s), you may have blood or chyle, respectively.       6. Spinal headaches: They may occur with any procedures in the area of the spine.       7. Persistent CSF (Cerebro-Spinal Fluid) leakage: This is a rare problem, but may occur  with prolonged intrathecal or epidural catheters either due to the formation of a fistulous  track or a dural tear.       8. Nerve damage: By working so close to the spinal cord, there is always a possibility of  nerve damage, which could be as serious as a permanent spinal cord injury with  paralysis.       9. Death:  Although rare, severe deadly allergic reactions known as "Anaphylactic  reaction" can  occur to any of the medications used.      10. Worsening of the symptoms:  We can always make thing worse.  What are the chances of something like this happening? Chances of any of this occuring are extremely low.  By statistics, you have more of a chance of getting killed in a motor vehicle accident: while driving to the hospital than any of the above occurring .  Nevertheless, you should be aware that they are possibilities.  In general, it is similar to taking a shower.  Everybody knows that you can slip, hit your head and get killed.  Does that mean that you should not shower again?  Nevertheless always keep in mind that statistics do not mean anything if you happen to be on the wrong side of them.  Even if a procedure has a 1 (one) in a 1,000,000 (million) chance of going wrong, it you happen to be that one..Also, keep in mind that by statistics, you have more of a chance of having something go wrong when taking medications.  Who should not have this procedure? If you are on a blood thinning medication (e.g. Coumadin, Plavix, see list of "Blood Thinners"), or if you have an active infection going on, you should not have the procedure.  If you are taking any blood thinners, please inform your physician.  How should I prepare for this procedure?  Do not eat or drink anything at least six hours prior to the procedure.  Bring a driver with you .  It cannot be a taxi.  Come accompanied by an adult that can drive you back, and that is strong enough to help you if your legs get weak or numb from the local anesthetic.  Take all of your medicines the morning of the procedure with just enough water to swallow them.  If you have diabetes, make sure that you are scheduled to have your procedure done first thing in the morning, whenever possible.  If you have diabetes, take only half of your insulin dose and notify our nurse that you have done so as soon as you arrive at the clinic.  If you are  diabetic, but only take blood sugar pills (oral hypoglycemic), then do not take them on the morning of your procedure.  You may take them after you have had the procedure.  Do not take aspirin or any aspirin-containing medications, at least eleven (11) days prior to the procedure.  They may prolong bleeding.  Wear loose fitting clothing that may be easy to take off and that you would not mind if it got stained with Betadine   or blood.  Do not wear any jewelry or perfume  Remove any nail coloring.  It will interfere with some of our monitoring equipment.  NOTE: Remember that this is not meant to be interpreted as a complete list of all possible complications.  Unforeseen problems may occur.  BLOOD THINNERS The following drugs contain aspirin or other products, which can cause increased bleeding during surgery and should not be taken for 2 weeks prior to and 1 week after surgery.  If you should need take something for relief of minor pain, you may take acetaminophen which is found in Tylenol,m Datril, Anacin-3 and Panadol. It is not blood thinner. The products listed below are.  Do not take any of the products listed below in addition to any listed on your instruction sheet.  A.P.C or A.P.C with Codeine Codeine Phosphate Capsules #3 Ibuprofen Ridaura  ABC compound Congesprin Imuran rimadil  Advil Cope Indocin Robaxisal  Alka-Seltzer Effervescent Pain Reliever and Antacid Coricidin or Coricidin-D  Indomethacin Rufen  Alka-Seltzer plus Cold Medicine Cosprin Ketoprofen S-A-C Tablets  Anacin Analgesic Tablets or Capsules Coumadin Korlgesic Salflex  Anacin Extra Strength Analgesic tablets or capsules CP-2 Tablets Lanoril Salicylate  Anaprox Cuprimine Capsules Levenox Salocol  Anexsia-D Dalteparin Magan Salsalate  Anodynos Darvon compound Magnesium Salicylate Sine-off  Ansaid Dasin Capsules Magsal Sodium Salicylate  Anturane Depen Capsules Marnal Soma  APF Arthritis pain formula Dewitt's Pills  Measurin Stanback  Argesic Dia-Gesic Meclofenamic Sulfinpyrazone  Arthritis Bayer Timed Release Aspirin Diclofenac Meclomen Sulindac  Arthritis pain formula Anacin Dicumarol Medipren Supac  Analgesic (Safety coated) Arthralgen Diffunasal Mefanamic Suprofen  Arthritis Strength Bufferin Dihydrocodeine Mepro Compound Suprol  Arthropan liquid Dopirydamole Methcarbomol with Aspirin Synalgos  ASA tablets/Enseals Disalcid Micrainin Tagament  Ascriptin Doan's Midol Talwin  Ascriptin A/D Dolene Mobidin Tanderil  Ascriptin Extra Strength Dolobid Moblgesic Ticlid  Ascriptin with Codeine Doloprin or Doloprin with Codeine Momentum Tolectin  Asperbuf Duoprin Mono-gesic Trendar  Aspergum Duradyne Motrin or Motrin IB Triminicin  Aspirin plain, buffered or enteric coated Durasal Myochrisine Trigesic  Aspirin Suppositories Easprin Nalfon Trillsate  Aspirin with Codeine Ecotrin Regular or Extra Strength Naprosyn Uracel  Atromid-S Efficin Naproxen Ursinus  Auranofin Capsules Elmiron Neocylate Vanquish  Axotal Emagrin Norgesic Verin  Azathioprine Empirin or Empirin with Codeine Normiflo Vitamin E  Azolid Emprazil Nuprin Voltaren  Bayer Aspirin plain, buffered or children's or timed BC Tablets or powders Encaprin Orgaran Warfarin Sodium  Buff-a-Comp Enoxaparin Orudis Zorpin  Buff-a-Comp with Codeine Equegesic Os-Cal-Gesic   Buffaprin Excedrin plain, buffered or Extra Strength Oxalid   Bufferin Arthritis Strength Feldene Oxphenbutazone   Bufferin plain or Extra Strength Feldene Capsules Oxycodone with Aspirin   Bufferin with Codeine Fenoprofen Fenoprofen Pabalate or Pabalate-SF   Buffets II Flogesic Panagesic   Buffinol plain or Extra Strength Florinal or Florinal with Codeine Panwarfarin   Buf-Tabs Flurbiprofen Penicillamine   Butalbital Compound Four-way cold tablets Penicillin   Butazolidin Fragmin Pepto-Bismol   Carbenicillin Geminisyn Percodan   Carna Arthritis Reliever Geopen Persantine    Carprofen Gold's salt Persistin   Chloramphenicol Goody's Phenylbutazone   Chloromycetin Haltrain Piroxlcam   Clmetidine heparin Plaquenil   Cllnoril Hyco-pap Ponstel   Clofibrate Hydroxy chloroquine Propoxyphen         Before stopping any of these medications, be sure to consult the physician who ordered them.  Some, such as Coumadin (Warfarin) are ordered to prevent or treat serious conditions such as "deep thrombosis", "pumonary embolisms", and other heart problems.  The amount of time that you may need   off of the medication may also vary with the medication and the reason for which you were taking it.  If you are taking any of these medications, please make sure you notify your pain physician before you undergo any procedures.          Epidural Steroid Injection An epidural steroid injection is a shot of steroid medicine and numbing medicine that is given into the space between the spinal cord and the bones in your back (epidural space). The shot helps relieve pain caused by an irritated or swollen nerve root. The amount of pain relief you get from the injection depends on what is causing the nerve to be swollen and irritated, and how long your pain lasts. You are more likely to benefit from this injection if your pain is strong and comes on suddenly rather than if you have had pain for a long time. Tell a health care provider about:  Any allergies you have.  All medicines you are taking, including vitamins, herbs, eye drops, creams, and over-the-counter medicines.  Any problems you or family members have had with anesthetic medicines.  Any blood disorders you have.  Any surgeries you have had.  Any medical conditions you have.  Whether you are pregnant or may be pregnant. What are the risks? Generally, this is a safe procedure. However, problems may occur, including:  Headache.  Bleeding.  Infection.  Allergic reaction to medicines.  Damage to your  nerves.  What happens before the procedure? Staying hydrated Follow instructions from your health care provider about hydration, which may include:  Up to 2 hours before the procedure - you may continue to drink clear liquids, such as water, clear fruit juice, black coffee, and plain tea.  Eating and drinking restrictions Follow instructions from your health care provider about eating and drinking, which may include:  8 hours before the procedure - stop eating heavy meals or foods such as meat, fried foods, or fatty foods.  6 hours before the procedure - stop eating light meals or foods, such as toast or cereal.  6 hours before the procedure - stop drinking milk or drinks that contain milk.  2 hours before the procedure - stop drinking clear liquids.  Medicine  You may be given medicines to lower anxiety.  Ask your health care provider about: ? Changing or stopping your regular medicines. This is especially important if you are taking diabetes medicines or blood thinners. ? Taking medicines such as aspirin and ibuprofen. These medicines can thin your blood. Do not take these medicines before your procedure if your health care provider instructs you not to. General instructions  Plan to have someone take you home from the hospital or clinic. What happens during the procedure?  You may receive a medicine to help you relax (sedative).  You will be asked to lie on your abdomen.  The injection site will be cleaned.  A numbing medicine (local anesthetic) will be used to numb the injection site.  A needle will be inserted through your skin into the epidural space. You may feel some discomfort when this happens. An X-ray machine will be used to make sure the needle is put as close as possible to the affected nerve.  A steroid medicine and a local anesthetic will be injected into the epidural space.  The needle will be removed.  A bandage (dressing) will be put over the injection  site. What happens after the procedure?  Your blood pressure, heart rate, breathing   rate, and blood oxygen level will be monitored until the medicines you were given have worn off.  Your arm or leg may feel weak or numb for a few hours.  The injection site may feel sore.  Do not drive for 24 hours if you received a sedative. This information is not intended to replace advice given to you by your health care provider. Make sure you discuss any questions you have with your health care provider. Document Released: 03/16/2008 Document Revised: 05/21/2016 Document Reviewed: 03/25/2016 [employer] Interactive Patient Education  2017 [employer]  

## 2017-07-15 NOTE — Progress Notes (Signed)
Patient's Name: Elaine Clayton  MRN: [id number]  Referring Provider: Idelle Crouch, MD  DOB: Apr 19, 1946  PCP: Idelle Crouch, MD  DOS: 07/15/2017  Note by: Gaspar Cola, MD  Service setting: Ambulatory outpatient  Specialty: Interventional Pain Management  Patient type: Established  Location: [facility] (AMB) Pain Management Facility  Visit type: Interventional Procedure   Primary Reason for Visit: Interventional Pain Management Treatment. CC: Back Pain (low left)  Procedure:  Anesthesia, Analgesia, Anxiolysis:  Procedure #1: Type: Therapeutic Inter-Laminar Epidural Steroid Injection Region: Lumbar Level: L4-5 Level. Laterality: Left-Sided Paramedial  Procedure #2: Type: Therapeutic Trans-Foraminal Epidural Steroid Injection Region: Lumbar Level: L5-S1 Paravertebral Laterality: Left-Sided Paravertebral Position: Prone  Type: Local Anesthesia with Moderate (Conscious) Sedation Local Anesthetic: Lidocaine 1% Route: Intravenous (IV) IV Access: Secured Sedation: Meaningful verbal contact was maintained at all times during the procedure  Indication(s): Analgesia and Anxiety  Indications: 1. Chronic lumbar radiculopathy (Bilateral) (L>R) (left L5)   2. Dropfoot (Left) (L5 radiculopathy)   3. 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 Lumbar foraminal stenosis (T12-S1, multilevel)   5. Lumbar lateral recess stenosis (left L2-3, bilateral L3-4, & right L4-5)   6. Numbness and tingling of lower extremity (Bilateral) (L>R)   7. Neurogenic pain    Pain Score: Pre-procedure: 5 /10 Post-procedure: 0-No pain/10  Pre-op Assessment:  Previous date of service: 07/07/17 Service provided: Evaluation Elaine Clayton is a 71 y.o. (year old), female patient, seen today for interventional treatment. She  has a past surgical history that includes Colonoscopy (2004); Neck surgery (2006); Eye surgery (2013); Appendectomy; Tubal ligation; Colonoscopy (N/A, 05/14/2015);  Esophagogastroduodenoscopy (N/A, 05/14/2015); Savory dilation (N/A, 05/14/2015); ERCP (N/A); Glaucoma surgery (Right, 2013); cataract (Right, 2013); Breast surgery (Left,  2013); Breast surgery (Right, November 06, 1999); Breast surgery (Left, May 07, 2007); EUS (N/A, 2011, 2012); Laparoscopic right colectomy (Right, 06/22/2015); right breast cancer; Trabeculectomy (Left, 03/05/2016); Cataract extraction w/PHACO (Left, 03/05/2016); Breast biopsy (Right, 2011); Breast excisional biopsy (Right, 2001); Breast excisional biopsy (Right, 2008); and Breast excisional biopsy (Left, 10/26/2012). Elaine Clayton has a current medication list which includes the following prescription(s): alprazolam, aspirin ec, azopt, bisacodyl, brimonidine tartrate-timolol, bupropion, dextromethorphan, escitalopram, esomeprazole, estradiol-levonorgestrel, fluticasone furoate-vilanterol, ipratropium, losartan, lumigan, nicotine polacrilex, ondansetron, ranitidine, rosuvastatin, ventolin hfa, and gabapentin, and the following Facility-Administered Medications: fentanyl, lidocaine, midazolam, sodium chloride flush, and sodium chloride flush. Her primarily concern today is the Back Pain (low left)  She indicates having had a flareup of her low back pain and leg pain yesterday that drove her to tears. Today this is feeling better.  Initial Vital Signs: Blood pressure 123/72, pulse 76, temperature 98.1 F (36.7 C), resp. rate 18, height 5\' 4"  (1.626 m), weight 100 lb (45.4 kg), SpO2 100 %. BMI: Estimated body mass index is 17.16 kg/m as calculated from the following:   Height as of this encounter: 5\' 4"  (1.626 m).   Weight as of this encounter: 100 lb (45.4 kg).  Risk Assessment: Allergies: Reviewed. She is allergic to influenza vaccines; ciprofloxacin; flu virus vaccine; sulfa antibiotics; and tetracyclines & related.  Allergy Precautions: None required Coagulopathies: Reviewed. None identified.  Blood-thinner therapy: None at this  time Active Infection(s): Reviewed. None identified. [name] is afebrile  Site Confirmation: Elaine Clayton was asked to confirm the procedure and laterality before marking the site Procedure checklist: Completed Consent: Before the procedure and under the influence of no sedative(s), amnesic(s), or anxiolytics, the patient was informed of the treatment options, risks and possible complications. To  fulfill our ethical and legal obligations, as recommended by the American Medical Association's Code of Ethics, I have informed the patient of my clinical impression; the nature and purpose of the treatment or procedure; the risks, benefits, and possible complications of the intervention; the alternatives, including doing nothing; the risk(s) and benefit(s) of the alternative treatment(s) or procedure(s); and the risk(s) and benefit(s) of doing nothing. The patient was provided information about the general risks and possible complications associated with the procedure. These may include, but are not limited to: failure to achieve desired goals, infection, bleeding, organ or nerve damage, allergic reactions, paralysis, and death. In addition, the patient was informed of those risks and complications associated to Spine-related procedures, such as failure to decrease pain; infection (i.e.: Meningitis, epidural or intraspinal abscess); bleeding (i.e.: epidural hematoma, subarachnoid hemorrhage, or any other type of intraspinal or peri-dural bleeding); organ or nerve damage (i.e.: Any type of peripheral nerve, nerve root, or spinal cord injury) with subsequent damage to sensory, motor, and/or autonomic systems, resulting in permanent pain, numbness, and/or weakness of one or several areas of the body; allergic reactions; (i.e.: anaphylactic reaction); and/or death. Furthermore, the patient was informed of those risks and complications associated with the medications. These include, but are not limited to: allergic  reactions (i.e.: anaphylactic or anaphylactoid reaction(s)); adrenal axis suppression; blood sugar elevation that in diabetics may result in ketoacidosis or comma; water retention that in patients with history of congestive heart failure may result in shortness of breath, pulmonary edema, and decompensation with resultant heart failure; weight gain; swelling or edema; medication-induced neural toxicity; particulate matter embolism and blood vessel occlusion with resultant organ, and/or nervous system infarction; and/or aseptic necrosis of one or more joints. Finally, the patient was informed that Medicine is not an exact science; therefore, there is also the possibility of unforeseen or unpredictable risks and/or possible complications that may result in a catastrophic outcome. The patient indicated having understood very clearly. We have given the patient no guarantees and we have made no promises. Enough time was given to the patient to ask questions, all of which were answered to the patient's satisfaction. Ms. Leaks has indicated that she wanted to continue with the procedure. Attestation: I, the ordering provider, attest that I have discussed with the patient the benefits, risks, side-effects, alternatives, likelihood of achieving goals, and potential problems during recovery for the procedure that I have provided informed consent. Date: 07/15/2017; Time: 8:07 AM  Pre-Procedure Preparation:  Monitoring: As per clinic protocol. Respiration, ETCO2, SpO2, BP, heart rate and rhythm monitor placed and checked for adequate function Safety Precautions: Patient was assessed for positional comfort and pressure points before starting the procedure. Time-out: I initiated and conducted the "Time-out" before starting the procedure, as per protocol. The patient was asked to participate by confirming the accuracy of the "Time Out" information. Verification of the correct person, site, and procedure were performed and  confirmed by me, the nursing staff, and the patient. "Time-out" conducted as per Joint Commission's Universal Protocol (UP.01.01.01). "Time-out" Date & Time: 07/15/2017; 2355 (started Transformial at 0856) hrs.  Description of Procedure #1 Process:   Target Area: The  interlaminar space, initially targeting the lower border of the superior vertebral body lamina. Approach: Posterior paramedial approach. Area Prepped: Entire Posterior Lumbosacral Region Prepping solution: ChloraPrep (2% chlorhexidine gluconate and 70% isopropyl alcohol) Safety Precautions: Aspiration looking for blood return was conducted prior to all injections. At no point did we inject any substances, as a needle was being  advanced. No attempts were made at seeking any paresthesias. Safe injection practices and needle disposal techniques used. Medications properly checked for expiration dates. SDV (single dose vial) medications used. Description of the Procedure: Protocol guidelines were followed. The patient was placed in position over the fluoroscopy table. The target area was identified and the area prepped in the usual manner. Skin desensitized using vapocoolant spray. Skin & deeper tissues infiltrated with local anesthetic. Appropriate amount of time allowed to pass for local anesthetics to take effect. The procedure needle was introduced through the skin, ipsilateral to the reported pain, and advanced to the target area. Bone was contacted and the needle walked caudad, until the lamina was cleared. The ligamentum flavum was engaged and loss-of-resistance technique used as the epidural needle was advanced. The epidural space was identified using "loss-of-resistance technique" with 2-3 ml of PF-NaCl (0.9% NSS), in a 5cc LOR glass syringe. Proper needle placement secured. Negative aspiration confirmed. Solution injected in intermittent fashion, asking for systemic symptoms every 0.5cc of injectate. The needles were then removed and the  area cleansed, making sure to leave some of the prepping solution back to take advantage of its long term bactericidal properties. Start Time: 0842 hrs. Materials:  Needle(s) Type: Epidural needle Gauge: 17G Length: 3.5-in Medication(s): We administered lactated ringers, midazolam, fentaNYL, iopamidol, dexamethasone, ropivacaine (PF) 2 mg/mL (0.2%), triamcinolone acetonide, ropivacaine (PF) 2 mg/mL (0.2%), and lidocaine (PF). Please see chart orders for dosing details.  Description of Procedure #2 Process:   Target Area: The inferior and lateral portion of the pedicle, just lateral to a line created by the 6:00 position of the pedicle and the superior articular process of the vertebral body below. On the lateral view, this target lies just posterior to the anterior aspect of the lamina and posterior to the midpoint created between the anterior and the posterior aspect of the neural foramina. Approach: Posterior paravertebral approach. Area Prepped: Same as above Prepping solution: Same as above Safety Precautions: Same as above Description of the Procedure: Protocol guidelines were followed. The patient was placed in position over the fluoroscopy table. The target area was identified and the area prepped in the usual manner. Skin desensitized using vapocoolant spray. Skin & deeper tissues infiltrated with local anesthetic. Appropriate amount of time allowed to pass for local anesthetics to take effect. The procedure needles were then advanced to the target area. Proper needle placement secured. Negative aspiration confirmed. Solution injected in intermittent fashion, asking for systemic symptoms every 0.2cc of injectate. The needles were then removed and the area cleansed, making sure to leave some of the prepping solution back to take advantage of its long term bactericidal properties. Vitals:   07/15/17 0910 07/15/17 0915 07/15/17 0925 07/15/17 0935  BP: (!) 136/54 (!) 126/100 121/70 (!) 121/56    Pulse: 65 62 64 64  Resp: 17 10 14 14   Temp: (!) 97 F (36.1 C)     SpO2: 98% 99% 100% 100%  Weight:      Height:        End Time: 0904 hrs. Materials:  Needle(s) Type: Regular needle Gauge: 22G Length: 3.5-in Medication(s): We administered lactated ringers, midazolam, fentaNYL, iopamidol, dexamethasone, ropivacaine (PF) 2 mg/mL (0.2%), triamcinolone acetonide, ropivacaine (PF) 2 mg/mL (0.2%), and lidocaine (PF). Please see chart orders for dosing details.  Imaging Guidance (Spinal):  Type of Imaging Technique: Fluoroscopy Guidance (Spinal) Indication(s): Assistance in needle guidance and placement for procedures requiring needle placement in or near specific anatomical locations not easily accessible without  such assistance. Exposure Time: Please see nurses notes. Contrast: Before injecting any contrast, we confirmed that the patient did not have an allergy to iodine, shellfish, or radiological contrast. Once satisfactory needle placement was completed at the desired level, radiological contrast was injected. Contrast injected under live fluoroscopy. No contrast complications. See chart for type and volume of contrast used. Fluoroscopic Guidance: I was personally present during the use of fluoroscopy. "Tunnel Vision Technique" used to obtain the best possible view of the target area. Parallax error corrected before commencing the procedure. "Direction-depth-direction" technique used to introduce the needle under continuous pulsed fluoroscopy. Once target was reached, antero-posterior, oblique, and lateral fluoroscopic projection used confirm needle placement in all planes. Images permanently stored in EMR. Interpretation: I personally interpreted the imaging intraoperatively. Adequate needle placement confirmed in multiple planes. Appropriate spread of contrast into desired area was observed. No evidence of afferent or efferent intravascular uptake. No intrathecal or subarachnoid spread  observed. Permanent images saved into the patient's record.  Antibiotic Prophylaxis:  Indication(s): None identified Antibiotic given: None  Post-operative Assessment:  EBL: None Complications: No immediate post-treatment complications observed by team, or reported by patient. Note: The patient tolerated the entire procedure well. A repeat set of vitals were taken after the procedure and the patient was kept under observation following institutional policy, for this type of procedure. Post-procedural neurological assessment was performed, showing return to baseline, prior to discharge. The patient was provided with post-procedure discharge instructions, including a section on how to identify potential problems. Should any problems arise concerning this procedure, the patient was given instructions to immediately contact us, at any time, without hesitation. In any case, we plan to contact the patient by telephone for a follow-up status report regarding this interventional procedure. Comments:  No additional relevant information.  Plan of Care  Disposition: Discharge home  Discharge Date & Time: 07/15/2017; 0936 hrs.  Physician-requested Follow-up:  Return for post-procedure eval (in 2 wks).  Future Appointments Date Time Provider [place]  07/20/2017 1:30 PM Bary Castilla, Forest Gleason, MD [facility] None  07/24/2017 2:00 PM Hollice Espy, MD [facility] None  08/03/2017 8:00 AM Milinda Pointer, MD [facility] None  08/04/2017 11:00 AM Hillis Range, PT [facility] None  08/06/2017 1:00 PM Hillis Range, PT [facility] None  08/11/2017 1:00 PM Hillis Range, PT [facility] None  08/13/2017 1:00 PM Hillis Range, PT [facility] None  08/18/2017 1:00 PM Hillis Range, PT [facility] None  08/20/2017 11:15 AM Hillis Range, PT [facility] None  08/25/2017 1:00 PM Hillis Range, PT [facility] None  08/27/2017 1:00 PM Hillis Range, PT [facility] None  09/01/2017 1:30 PM  Hillis Range, PT [facility] None  09/03/2017 1:00 PM Hillis Range, PT [facility] None  09/08/2017 1:00 PM Hillis Range, PT [facility] None  09/10/2017 1:00 PM Hillis Range, PT [facility] None  09/15/2017 11:15 AM Hillis Range, PT [facility] None  09/17/2017 1:00 PM Hillis Range, PT [facility] None  02/10/2018 1:30 PM Dohmeier, Asencion Partridge, MD [facility] None   Medications ordered for procedure: Meds ordered this encounter  Medications  . lactated ringers infusion 1,000 mL  . midazolam (VERSED) 5 MG/5ML injection 1-2 mg    Make sure Flumazenil is available in the pyxis when using this medication. If oversedation occurs, administer 0.2 mg IV over 15 sec. If after 45 sec no response, administer 0.2 mg again over 1 min; may repeat at 1 min intervals; not to exceed 4 doses (1 mg)  .  fentaNYL (SUBLIMAZE) injection 25-50 mcg    Make sure Narcan is available in the pyxis when using this medication. In the event of respiratory depression (RR< 8/min): Titrate NARCAN (naloxone) in increments of 0.1 to 0.2 mg IV at 2-3 minute intervals, until desired degree of reversal.  . lidocaine 1.5 % injection 20 mL    This order is for documentation purposes only. Lidocaine w/ preservative included in block trays.  . iopamidol (ISOVUE-M) 41 % intrathecal injection 10 mL  . dexamethasone (DECADRON) injection 10 mg  . ropivacaine (PF) 2 mg/mL (0.2%) (NAROPIN) injection 1 mL  . sodium chloride flush (NS) 0.9 % injection 1 mL  . triamcinolone acetonide (KENALOG-40) injection 40 mg  . ropivacaine (PF) 2 mg/mL (0.2%) (NAROPIN) injection 2 mL  . sodium chloride flush (NS) 0.9 % injection 2 mL  . lidocaine (PF) (XYLOCAINE) 1 % injection 5 mL  . gabapentin (NEURONTIN) 100 MG capsule    Sig: Take 1-3 capsules (100-300 mg total) by mouth at bedtime.    Dispense:  90 capsule    Refill:  0    Do not place this medication, or any other prescription from our practice, on "Automatic Refill". Patient  may have prescription filled one day early if pharmacy is closed on scheduled refill date.   Medications administered: We administered lactated ringers, midazolam, fentaNYL, iopamidol, dexamethasone, ropivacaine (PF) 2 mg/mL (0.2%), triamcinolone acetonide, ropivacaine (PF) 2 mg/mL (0.2%), and lidocaine (PF).  See the medical record for exact dosing, route, and time of administration.  Lab-work, Procedure(s), & Referral(s) Ordered: Orders Placed This Encounter  Procedures  . Lumbar Epidural Injection  . Lumbar Transforaminal Epidural  . DG C-Arm 1-60 Min-No Report  . Informed Consent Details: Transcribe to consent form and obtain patient signature  . Provider attestation of informed consent for procedure/surgical case  . Verify informed consent  . Discharge instructions  . Follow-up   Imaging Ordered: Results for orders placed in visit on 06/10/17  DG C-Arm 1-60 Min-No Report   Narrative Fluoroscopy was utilized by the requesting physician.  No radiographic  interpretation.    New Prescriptions   No medications on file   Primary Care Physician: Idelle Crouch, MD Location: [facility] Note by: Gaspar Cola, MD Date: 07/15/2017; Time: 11:39 AM  Disclaimer:  Medicine is not an exact science. The only guarantee in medicine is that nothing is guaranteed. It is important to note that the decision to proceed with this intervention was based on the information collected from the patient. The Data and conclusions were drawn from the patient's questionnaire, the interview, and the physical examination. Because the information was provided in large part by the patient, it cannot be guaranteed that it has not been purposely or unconsciously manipulated. Every effort has been made to obtain as much relevant data as possible for this evaluation. It is important to note that the conclusions that lead to this procedure are derived in large part from the  available data. Always take into account that the treatment will also be dependent on availability of resources and existing treatment guidelines, considered by other Pain Management Practitioners as being common knowledge and practice, at the time of the intervention. For Medico-Legal purposes, it is also important to point out that variation in procedural techniques and pharmacological choices are the acceptable norm. The indications, contraindications, technique, and results of the above procedure should only be interpreted and judged by a Board-Certified Interventional Pain Specialist with extensive familiarity and  expertise in the same exact procedure and technique.  Instructions provided at this appointment: Patient Instructions   ____________________________________________________________________________________________  Post-Procedure instructions Instructions:  Apply ice: Fill a plastic sandwich bag with crushed ice. Cover it with a small towel and apply to injection site. Apply for 15 minutes then remove x 15 minutes. Repeat sequence on day of procedure, until you go to bed. The purpose is to minimize swelling and discomfort after procedure.  Apply heat: Apply heat to procedure site starting the day following the procedure. The purpose is to treat any soreness and discomfort from the procedure.  Food intake: Start with clear liquids (like water) and advance to regular food, as tolerated.   Physical activities: Keep activities to a minimum for the first 8 hours after the procedure.   Driving: If you have received any sedation, you are not allowed to drive for 24 hours after your procedure.  Blood thinner: Restart your blood thinner 6 hours after your procedure. (Only for those taking blood thinners)  Insulin: As soon as you can eat, you may resume your normal dosing schedule. (Only for those taking insulin)  Infection prevention: Keep procedure site clean and dry.  Post-procedure  Pain Diary: Extremely important that this be done correctly and accurately. Recorded information will be used to determine the next step in treatment.  Pain evaluated is that of treated area only. Do not include pain from an untreated area.  Complete every hour, on the hour, for the initial 8 hours. Set an alarm to help you do this part accurately.  Do not go to sleep and have it completed later. It will not be accurate.  Follow-up appointment: Keep your follow-up appointment after the procedure. Usually 2 weeks for most procedures. (6 weeks in the case of radiofrequency.) Bring you pain diary.  Expect:  From numbing medicine (AKA: Local Anesthetics): Numbness or decrease in pain.  Onset: Full effect within 15 minutes of injected.  Duration: It will depend on the type of local anesthetic used. On the average, 1 to 8 hours.   From steroids: Decrease in swelling or inflammation. Once inflammation is improved, relief of the pain will follow.  Onset of benefits: Depends on the amount of swelling present. The more swelling, the longer it will take for the benefits to be seen. In some cases, up to 10 days.  Duration: Steroids will stay in the system x 2 weeks. Duration of benefits will depend on multiple posibilities including persistent irritating factors.  From procedure: Some discomfort is to be expected once the numbing medicine wears off. This should be minimal if ice and heat are applied as instructed. Call if:  You experience numbness and weakness that gets worse with time, as opposed to wearing off.  New onset bowel or bladder incontinence. (Spinal procedures only)  Emergency Numbers:  Durning business hours (Monday - Thursday, 8:00 AM - 4:00 PM) (Friday, 9:00 AM - 12:00 Noon): [phone]  After hours: [phone] ____________________________________________________________________________________________  Pain Management Discharge Instructions  General Discharge  Instructions :  If you need to reach your doctor call: Monday-Friday 8:00 am - 4:00 pm at [phone] or toll free [phone].  After clinic hours [phone] to have operator reach doctor.  Bring all of your medication bottles to all your appointments in the pain clinic.  To cancel or reschedule your appointment with Pain Management please remember to call 24 hours in advance to avoid a fee.  Refer to the educational materials which you have been given  on: General Risks, I had my Procedure. Discharge Instructions, Post Sedation.  Post Procedure Instructions:  The drugs you were given will stay in your system until tomorrow, so for the next 24 hours you should not drive, make any legal decisions or drink any alcoholic beverages.  You may eat anything you prefer, but it is better to start with liquids then soups and crackers, and gradually work up to solid foods.  Please notify your doctor immediately if you have any unusual bleeding, trouble breathing or pain that is not related to your normal pain.  Depending on the type of procedure that was done, some parts of your body may feel week and/or numb.  This usually clears up by tonight or the next day.  Walk with the use of an assistive device or accompanied by an adult for the 24 hours.  You may use ice on the affected area for the first 24 hours.  Put ice in a Ziploc bag and cover with a towel and place against area 15 minutes on 15 minutes off.  You may switch to heat after 24 hours.GENERAL RISKS AND COMPLICATIONS  What are the risk, side effects and possible complications? Generally speaking, most procedures are safe.  However, with any procedure there are risks, side effects, and the possibility of complications.  The risks and complications are dependent upon the sites that are lesioned, or the type of nerve block to be performed.  The closer the procedure is to the spine, the more serious the risks are.  Great care is taken when  placing the radio frequency needles, block needles or lesioning probes, but sometimes complications can occur. 1. Infection: Any time there is an injection through the skin, there is a risk of infection.  This is why sterile conditions are used for these blocks.  There are four possible types of infection. 1. Localized skin infection. 2. Central Nervous System Infection-This can be in the form of Meningitis, which can be deadly. 3. Epidural Infections-This can be in the form of an epidural abscess, which can cause pressure inside of the spine, causing compression of the spinal cord with subsequent paralysis. This would require an emergency surgery to decompress, and there are no guarantees that the patient would recover from the paralysis. 4. Discitis-This is an infection of the intervertebral discs.  It occurs in about 1% of discography procedures.  It is difficult to treat and it may lead to surgery.        2. Pain: the needles have to go through skin and soft tissues, will cause soreness.       3. Damage to internal structures:  The nerves to be lesioned may be near blood vessels or    other nerves which can be potentially damaged.       4. Bleeding: Bleeding is more common if the patient is taking blood thinners such as  aspirin, Coumadin, Ticiid, Plavix, etc., or if he/she have some genetic predisposition  such as hemophilia. Bleeding into the spinal canal can cause compression of the spinal  cord with subsequent paralysis.  This would require an emergency surgery to  decompress and there are no guarantees that the patient would recover from the  paralysis.       5. Pneumothorax:  Puncturing of a lung is a possibility, every time a needle is introduced in  the area of the chest or upper back.  Pneumothorax refers to free air around the  collapsed lung(s), inside of the thoracic  cavity (chest cavity).  Another two possible  complications related to a similar event would include: Hemothorax and  Chylothorax.   These are variations of the Pneumothorax, where instead of air around the collapsed  lung(s), you may have blood or chyle, respectively.       6. Spinal headaches: They may occur with any procedures in the area of the spine.       7. Persistent CSF (Cerebro-Spinal Fluid) leakage: This is a rare problem, but may occur  with prolonged intrathecal or epidural catheters either due to the formation of a fistulous  track or a dural tear.       8. Nerve damage: By working so close to the spinal cord, there is always a possibility of  nerve damage, which could be as serious as a permanent spinal cord injury with  paralysis.       9. Death:  Although rare, severe deadly allergic reactions known as "Anaphylactic  reaction" can occur to any of the medications used.      10. Worsening of the symptoms:  We can always make thing worse.  What are the chances of something like this happening? Chances of any of this occuring are extremely low.  By statistics, you have more of a chance of getting killed in a motor vehicle accident: while driving to the hospital than any of the above occurring .  Nevertheless, you should be aware that they are possibilities.  In general, it is similar to taking a shower.  Everybody knows that you can slip, hit your head and get killed.  Does that mean that you should not shower again?  Nevertheless always keep in mind that statistics do not mean anything if you happen to be on the wrong side of them.  Even if a procedure has a 1 (one) in a 1,000,000 (million) chance of going wrong, it you happen to be that one..Also, keep in mind that by statistics, you have more of a chance of having something go wrong when taking medications.  Who should not have this procedure? If you are on a blood thinning medication (e.g. Coumadin, Plavix, see list of "Blood Thinners"), or if you have an active infection going on, you should not have the procedure.  If you are taking any blood thinners,  please inform your physician.  How should I prepare for this procedure?  Do not eat or drink anything at least six hours prior to the procedure.  Bring a driver with you .  It cannot be a taxi.  Come accompanied by an adult that can drive you back, and that is strong enough to help you if your legs get weak or numb from the local anesthetic.  Take all of your medicines the morning of the procedure with just enough water to swallow them.  If you have diabetes, make sure that you are scheduled to have your procedure done first thing in the morning, whenever possible.  If you have diabetes, take only half of your insulin dose and notify our nurse that you have done so as soon as you arrive at the clinic.  If you are diabetic, but only take blood sugar pills (oral hypoglycemic), then do not take them on the morning of your procedure.  You may take them after you have had the procedure.  Do not take aspirin or any aspirin-containing medications, at least eleven (11) days prior to the procedure.  They may prolong bleeding.  Wear loose fitting clothing that  may be easy to take off and that you would not mind if it got stained with Betadine or blood.  Do not wear any jewelry or perfume  Remove any nail coloring.  It will interfere with some of our monitoring equipment.  NOTE: Remember that this is not meant to be interpreted as a complete list of all possible complications.  Unforeseen problems may occur.  BLOOD THINNERS The following drugs contain aspirin or other products, which can cause increased bleeding during surgery and should not be taken for 2 weeks prior to and 1 week after surgery.  If you should need take something for relief of minor pain, you may take acetaminophen which is found in Tylenol,m Datril, Anacin-3 and Panadol. It is not blood thinner. The products listed below are.  Do not take any of the products listed below in addition to any listed on your instruction  sheet.  A.P.C or A.P.C with Codeine Codeine Phosphate Capsules #3 Ibuprofen Ridaura  ABC compound Congesprin Imuran rimadil  Advil Cope Indocin Robaxisal  Alka-Seltzer Effervescent Pain Reliever and Antacid Coricidin or Coricidin-D  Indomethacin Rufen  Alka-Seltzer plus Cold Medicine Cosprin Ketoprofen S-A-C Tablets  Anacin Analgesic Tablets or Capsules Coumadin Korlgesic Salflex  Anacin Extra Strength Analgesic tablets or capsules CP-2 Tablets Lanoril Salicylate  Anaprox Cuprimine Capsules Levenox Salocol  Anexsia-D Dalteparin Magan Salsalate  Anodynos Darvon compound Magnesium Salicylate Sine-off  Ansaid Dasin Capsules Magsal Sodium Salicylate  Anturane Depen Capsules Marnal Soma  APF Arthritis pain formula Dewitt's Pills Measurin Stanback  Argesic Dia-Gesic Meclofenamic Sulfinpyrazone  Arthritis Bayer Timed Release Aspirin Diclofenac Meclomen Sulindac  Arthritis pain formula Anacin Dicumarol Medipren Supac  Analgesic (Safety coated) Arthralgen Diffunasal Mefanamic Suprofen  Arthritis Strength Bufferin Dihydrocodeine Mepro Compound Suprol  Arthropan liquid Dopirydamole Methcarbomol with Aspirin Synalgos  ASA tablets/Enseals Disalcid Micrainin Tagament  Ascriptin Doan's Midol Talwin  Ascriptin A/D Dolene Mobidin Tanderil  Ascriptin Extra Strength Dolobid Moblgesic Ticlid  Ascriptin with Codeine Doloprin or Doloprin with Codeine Momentum Tolectin  Asperbuf Duoprin Mono-gesic Trendar  Aspergum Duradyne Motrin or Motrin IB Triminicin  Aspirin plain, buffered or enteric coated Durasal Myochrisine Trigesic  Aspirin Suppositories Easprin Nalfon Trillsate  Aspirin with Codeine Ecotrin Regular or Extra Strength Naprosyn Uracel  Atromid-S Efficin Naproxen Ursinus  Auranofin Capsules Elmiron Neocylate Vanquish  Axotal Emagrin Norgesic Verin  Azathioprine Empirin or Empirin with Codeine Normiflo Vitamin E  Azolid Emprazil Nuprin Voltaren  Bayer Aspirin plain, buffered or children's or  timed BC Tablets or powders Encaprin Orgaran Warfarin Sodium  Buff-a-Comp Enoxaparin Orudis Zorpin  Buff-a-Comp with Codeine Equegesic Os-Cal-Gesic   Buffaprin Excedrin plain, buffered or Extra Strength Oxalid   Bufferin Arthritis Strength Feldene Oxphenbutazone   Bufferin plain or Extra Strength Feldene Capsules Oxycodone with Aspirin   Bufferin with Codeine Fenoprofen Fenoprofen Pabalate or Pabalate-SF   Buffets II Flogesic Panagesic   Buffinol plain or Extra Strength Florinal or Florinal with Codeine Panwarfarin   Buf-Tabs Flurbiprofen Penicillamine   Butalbital Compound Four-way cold tablets Penicillin   Butazolidin Fragmin Pepto-Bismol   Carbenicillin Geminisyn Percodan   Carna Arthritis Reliever Geopen Persantine   Carprofen Gold's salt Persistin   Chloramphenicol Goody's Phenylbutazone   Chloromycetin Haltrain Piroxlcam   Clmetidine heparin Plaquenil   Cllnoril Hyco-pap Ponstel   Clofibrate Hydroxy chloroquine Propoxyphen         Before stopping any of these medications, be sure to consult the physician who ordered them.  Some, such as Coumadin (Warfarin) are ordered to prevent or treat serious conditions such  as "deep thrombosis", "pumonary embolisms", and other heart problems.  The amount of time that you may need off of the medication may also vary with the medication and the reason for which you were taking it.  If you are taking any of these medications, please make sure you notify your pain physician before you undergo any procedures.          Epidural Steroid Injection An epidural steroid injection is a shot of steroid medicine and numbing medicine that is given into the space between the spinal cord and the bones in your back (epidural space). The shot helps relieve pain caused by an irritated or swollen nerve root. The amount of pain relief you get from the injection depends on what is causing the nerve to be swollen and irritated, and how long your pain lasts. You  are more likely to benefit from this injection if your pain is strong and comes on suddenly rather than if you have had pain for a long time. Tell a health care provider about:  Any allergies you have.  All medicines you are taking, including vitamins, herbs, eye drops, creams, and over-the-counter medicines.  Any problems you or family members have had with anesthetic medicines.  Any blood disorders you have.  Any surgeries you have had.  Any medical conditions you have.  Whether you are pregnant or may be pregnant. What are the risks? Generally, this is a safe procedure. However, problems may occur, including:  Headache.  Bleeding.  Infection.  Allergic reaction to medicines.  Damage to your nerves.  What happens before the procedure? Staying hydrated Follow instructions from your health care provider about hydration, which may include:  Up to 2 hours before the procedure - you may continue to drink clear liquids, such as water, clear fruit juice, black coffee, and plain tea.  Eating and drinking restrictions Follow instructions from your health care provider about eating and drinking, which may include:  8 hours before the procedure - stop eating heavy meals or foods such as meat, fried foods, or fatty foods.  6 hours before the procedure - stop eating light meals or foods, such as toast or cereal.  6 hours before the procedure - stop drinking milk or drinks that contain milk.  2 hours before the procedure - stop drinking clear liquids.  Medicine  You may be given medicines to lower anxiety.  Ask your health care provider about: ? Changing or stopping your regular medicines. This is especially important if you are taking diabetes medicines or blood thinners. ? Taking medicines such as aspirin and ibuprofen. These medicines can thin your blood. Do not take these medicines before your procedure if your health care provider instructs you not to. General  instructions  Plan to have someone take you home from the hospital or clinic. What happens during the procedure?  You may receive a medicine to help you relax (sedative).  You will be asked to lie on your abdomen.  The injection site will be cleaned.  A numbing medicine (local anesthetic) will be used to numb the injection site.  A needle will be inserted through your skin into the epidural space. You may feel some discomfort when this happens. An X-ray machine will be used to make sure the needle is put as close as possible to the affected nerve.  A steroid medicine and a local anesthetic will be injected into the epidural space.  The needle will be removed.  A bandage (dressing) will  be put over the injection site. What happens after the procedure?  Your blood pressure, heart rate, breathing rate, and blood oxygen level will be monitored until the medicines you were given have worn off.  Your arm or leg may feel weak or numb for a few hours.  The injection site may feel sore.  Do not drive for 24 hours if you received a sedative. This information is not intended to replace advice given to you by your health care provider. Make sure you discuss any questions you have with your health care provider. Document Released: 03/16/2008 Document Revised: 05/21/2016 Document Reviewed: 03/25/2016 Elsevier Interactive Patient Education  2017 [employer].

## 2017-07-16 ENCOUNTER — Telehealth: Payer: Self-pay | Admitting: *Deleted

## 2017-07-16 ENCOUNTER — Telehealth: Payer: Self-pay

## 2017-07-16 NOTE — Telephone Encounter (Signed)
Received message that patient's gabapentin was not escribed.  When chart was checked it was escribed to employee pharmacy at [facility].  Patient states that she does not use that pharmacy and has not in 5 years.  Gabapentin verbally called to [facility] on [address]. Blanch Media will call to let patient know.  I have also changed pharmacy in system for patient.

## 2017-07-16 NOTE — Telephone Encounter (Signed)
Patient called in asking about her gabapentin script. After investigating I found that the script had been called in to the [facility], which the patient has not used in 5 years. I got Cecille Rubin to change the information in the computer and call in the patients script to the correct pharmacy.

## 2017-07-16 NOTE — Telephone Encounter (Signed)
Spoke with patients husband, states patient is still sleeping.  Instructed that if patient is having any problems or concerns upon rising to please let us know.

## 2017-07-20 ENCOUNTER — Ambulatory Visit (INDEPENDENT_AMBULATORY_CARE_PROVIDER_SITE_OTHER): Payer: Medicare Other | Admitting: General Surgery

## 2017-07-20 ENCOUNTER — Encounter: Payer: Self-pay | Admitting: General Surgery

## 2017-07-20 VITALS — BP 129/71 | HR 84 | Resp 14 | Ht 65.0 in | Wt 102.0 lb

## 2017-07-20 DIAGNOSIS — Z853 Personal history of malignant neoplasm of breast: Secondary | ICD-10-CM | POA: Diagnosis not present

## 2017-07-20 NOTE — Progress Notes (Signed)
Patient ID: Elaine Clayton, female   DOB: 12/03/1946, 71 y.o.   MRN: [id number]  Chief Complaint  Patient presents with  . Follow-up    HPI Elaine Clayton is a 71 y.o. female  With a history of right breast cancer who presents for a breast evaluation. The most recent mammogram was done on 07/02/17. Patient does perform regular self breast checks and gets regular mammograms done. She states the she still has some occasional discomfort in the right outer breast area.  She has been having some balance problems recently. She was seen by Neurology in [place]. She had a brain scan and CT done. She is going to pain management for this due to numbness. She was started on Neurontin and will be going to Neuro Physical Therapy.    HPI  Past Medical History:  Diagnosis Date  . Anemia   . Bipolar affective disorder ([place])    2  . Broken foot 2015   right foot  . Chronic gastritis   . Chronic kidney disease    Dr Holley Raring  . COPD (chronic obstructive pulmonary disease) ([place])   . Dizziness    inner ear (per pt) several times per week  . Dysphagia   . Glaucoma   . Hyperlipemia   . Hypertension    pt has recently come off BP meds.  MD aware. BP seems stable.  . Lithium toxicity 2015  . 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 Neck stiffness    s/p C3-C4 fusion, limited right turn  . Osteoarthritis    back  . Pancreatitis   . Personal history of malignant neoplasm of breast   . Pituitary microadenoma ([place])   . Recurrent UTI   . Renal insufficiency   . Stomach ulcer 2112    Past Surgical History:  Procedure Laterality Date  . APPENDECTOMY    . BREAST BIOPSY Right 2011   neg  . BREAST EXCISIONAL BIOPSY Right 2001   neg  . BREAST EXCISIONAL BIOPSY Right 2008   breast ca 2008 radiation  . BREAST EXCISIONAL BIOPSY Left 10/26/2012   neg  . BREAST SURGERY Left  2013   left breast wide excision,Intraductal  papilloma, ductal hyperplasia and sclerosing adenosis. Microcalcifications associated with columnar cell change. No evidence of atypia or malignancy. Margins are unremarkable.  Marland Kitchen BREAST SURGERY Right November 06, 1999   multiple areas of microcalcification showing evidence of sclerosing adenosis and ductal hyperplasia.  Marland Kitchen BREAST SURGERY Left May 07, 2007   wide excision.  . cataract Right 2013  . CATARACT EXTRACTION W/PHACO Left 03/05/2016   Procedure: CATARACT EXTRACTION PHACO AND INTRAOCULAR LENS PLACEMENT (IOC);  Surgeon: Leandrew Koyanagi, MD;  Location: [place];  Service: Ophthalmology;  Laterality: Left;  . COLONOSCOPY  2004  . COLONOSCOPY N/A 05/14/2015   Procedure: COLONOSCOPY;  Surgeon: Manya Silvas, MD;  [facility] ENDOSCOPY;  Service: Endoscopy;  Laterality: N/A;  . ERCP N/A   . ESOPHAGOGASTRODUODENOSCOPY N/A 05/14/2015   Procedure: ESOPHAGOGASTRODUODENOSCOPY (EGD);  Surgeon: Manya Silvas, MD;  Location: [facility] ENDOSCOPY;  Service: Endoscopy;  Laterality: N/A;  . EUS N/A 2011, 2012  . EYE SURGERY  2013  . GLAUCOMA SURGERY Right 2013  . LAPAROSCOPIC RIGHT COLECTOMY Right 06/22/2015   Procedure: LAPAROSCOPIC RIGHT COLECTOMY;  Surgeon: Robert Bellow, MD;  Location: [facility] ORS;  Service: General;  Laterality: Right;  . NECK SURGERY  2006  .  right breast cancer    . SAVORY DILATION N/A 05/14/2015   Procedure: SAVORY DILATION;  Surgeon: Manya Silvas, MD;  Location: [facility] ENDOSCOPY;  Service: Endoscopy;  Laterality: N/A;  . TRABECULECTOMY Left 03/05/2016   Procedure: TRABECULECTOMY WITH [facility] AND EXPRESS SHUNT;  Surgeon: Leandrew Koyanagi, MD;  Location: [place];  Service: Ophthalmology;  Laterality: Left;  . TUBAL LIGATION      Family History  Problem Relation Age of Onset  . Breast cancer Mother 49    Social History Social History  Substance Use Topics  . Smoking status: Former Smoker    Packs/day: 1.00    Years: 35.00    Quit date:  12/23/2007  . Smokeless tobacco: Never Used  . Alcohol use 0.0 oz/week     Comment: 1 glass wine/mo    Allergies  Allergen Reactions  . Influenza Vaccines Anaphylaxis  . Ciprofloxacin     Yeast infection  . Flu Virus Vaccine Other (See Comments)    Muscle spams  . Sulfa Antibiotics Rash  . Tetracyclines & Related Rash    Current Outpatient Prescriptions  Medication Sig Dispense Refill  . ALPRAZolam (XANAX) 0.5 MG tablet Take 0.5 mg by mouth 2 (two) times daily as needed for sleep. 0.5-1 tablet    . aspirin EC 81 MG tablet Take 1 tablet (81 mg total) by mouth daily. 90 tablet   . AZOPT 1 % ophthalmic suspension Place 1 drop into the left eye.   0  . bisacodyl (DULCOLAX) 5 MG EC tablet Take 5 mg by mouth daily as needed for moderate constipation.    . Brimonidine Tartrate-Timolol (COMBIGAN OP) Apply 1 drop to eye 2 (two) times daily.    Marland Kitchen buPROPion (WELLBUTRIN SR) 150 MG 12 hr tablet Take 150 mg by mouth every morning. 2 tablets    . dextromethorphan 15 MG/5ML syrup Take 10 mLs by mouth 4 (four) times daily as needed for cough.    . escitalopram (LEXAPRO) 10 MG tablet Take 10 mg by mouth daily.   0  . esomeprazole (NEXIUM) 40 MG capsule Take 40 mg by mouth.     . estradiol-levonorgestrel (CLIMARA PRO) 0.045-0.015 MG/DAY apply 1 patch every week 12 patch 3  . fluticasone furoate-vilanterol (BREO ELLIPTA) 100-25 MCG/INH AEPB Inhale 1 puff into the lungs daily. 3 each 3  . gabapentin (NEURONTIN) 100 MG capsule Take 1-3 capsules (100-300 mg total) by mouth at bedtime. 90 capsule 0  . ipratropium (ATROVENT) 0.06 % nasal spray Place 2 sprays into both nostrils 4 (four) times daily as needed for rhinitis.    Marland Kitchen losartan (COZAAR) 25 MG tablet Take 25 mg by mouth daily.    Marland Kitchen LUMIGAN 0.01 % SOLN apply 1 drop to into both  eyes at bedtime once daily ONLY  0  . nicotine polacrilex (NICORETTE) 2 MG gum Take 2 mg by mouth as needed for smoking cessation.    . ondansetron (ZOFRAN) 8 MG tablet Take by  mouth every 8 (eight) hours as needed for nausea or vomiting.    . ranitidine (ZANTAC) 150 MG tablet Take 150 mg by mouth daily.    . rosuvastatin (CRESTOR) 10 MG tablet Take 10 mg by mouth at bedtime.     . VENTOLIN HFA 108 (90 Base) MCG/ACT inhaler inhale 2 puffs by mouth INTO THE LUNGS every 6 hours if needed for wheezing or shortness of breath 18 g 6   No current facility-administered medications for this visit.     Review  of Systems Review of Systems  Constitutional: Negative.   Respiratory: Negative.   Cardiovascular: Negative.     Blood pressure 129/71, pulse 84, resp. rate 14, height 5\' 5"  (1.651 m), weight 102 lb (46.3 kg).  Physical Exam Physical Exam  Constitutional: She is oriented to person, place, and time. She appears well-developed and well-nourished.  Eyes: Conjunctivae are normal. No scleral icterus.  Neck: Neck supple.  Cardiovascular: Normal rate, regular rhythm and normal heart sounds.   Pulmonary/Chest: Effort normal and breath sounds normal. Right breast exhibits no inverted nipple, no mass, no nipple discharge, no skin change and no tenderness. Left breast exhibits no inverted nipple, no mass, no nipple discharge, no skin change and no tenderness.    Lymphadenopathy:    She has no cervical adenopathy.    She has no axillary adenopathy.  Neurological: She is alert and oriented to person, place, and time.  Skin: Skin is warm and dry.  Psychiatric: She has a normal mood and affect.    Data Reviewed 07/03/2017 screening mammograms were reviewed. Moderately dense breast. Postsurgical changes noted on the right. BI-RADS-1.  Assessment    Benign breast exam.  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    Plan    Screening mammograms in one year.    HPI, Physical Exam, Assessment and Plan have been scribed under the direction and in the presence of Robert Bellow, MD  Concepcion Living, LPN  I have completed the exam and reviewed the above  documentation for accuracy and completeness.  I agree with the above.  Haematologist has been used and any errors in dictation or transcription are unintentional.  Hervey Ard, M.D., F.A.C.S.  Robert Bellow 07/20/2017, 9:12 PM

## 2017-07-23 ENCOUNTER — Other Ambulatory Visit: Payer: Self-pay

## 2017-07-23 DIAGNOSIS — N390 Urinary tract infection, site not specified: Secondary | ICD-10-CM

## 2017-07-24 ENCOUNTER — Encounter: Payer: Self-pay | Admitting: Urology

## 2017-07-24 ENCOUNTER — Ambulatory Visit (INDEPENDENT_AMBULATORY_CARE_PROVIDER_SITE_OTHER): Payer: Medicare Other | Admitting: Urology

## 2017-07-24 ENCOUNTER — Other Ambulatory Visit
Admission: RE | Admit: 2017-07-24 | Discharge: 2017-07-24 | Disposition: A | Discharge status: Home / Self Care | Payer: Medicare Other | Source: Ambulatory Visit | Attending: Urology | Admitting: Urology

## 2017-07-24 VITALS — BP 136/64 | HR 74 | Ht 65.0 in | Wt 105.0 lb

## 2017-07-24 DIAGNOSIS — R350 Frequency of micturition: Secondary | ICD-10-CM | POA: Diagnosis not present

## 2017-07-24 DIAGNOSIS — K5904 Chronic idiopathic constipation: Secondary | ICD-10-CM

## 2017-07-24 DIAGNOSIS — N3941 Urge incontinence: Secondary | ICD-10-CM | POA: Diagnosis not present

## 2017-07-24 DIAGNOSIS — N390 Urinary tract infection, site not specified: Secondary | ICD-10-CM | POA: Insufficient documentation

## 2017-07-24 DIAGNOSIS — R32 Unspecified urinary incontinence: Secondary | ICD-10-CM

## 2017-07-24 LAB — URINALYSIS, COMPLETE (UACMP) WITH MICROSCOPIC
Bacteria, UA: NONE SEEN
Bilirubin Urine: NEGATIVE
GLUCOSE, UA: NEGATIVE mg/dL
Hgb urine dipstick: NEGATIVE
Ketones, ur: NEGATIVE mg/dL
Nitrite: NEGATIVE
PH: 6 (ref 5.0–8.0)
Protein, ur: NEGATIVE mg/dL
RBC / HPF: NONE SEEN RBC/hpf (ref 0–5)
Specific Gravity, Urine: 1.015 (ref 1.005–1.030)

## 2017-07-24 LAB — BLADDER SCAN AMB NON-IMAGING: Scan Result: 52

## 2017-07-24 MED ORDER — MIRABEGRON ER 25 MG PO TB24: 25.0000 mg | ORAL_TABLET | Freq: Every day | ORAL | 11 refills | Status: DC

## 2017-07-24 NOTE — Progress Notes (Signed)
07/24/2017 8:30 AM   Elaine Clayton March 26, 1946 [id number]  Referring provider: Idelle Crouch, MD [facility] [place], [place] [address]  Chief Complaint  Patient presents with  . Urinary Incontinence    Urge  Incontinence last 01/2015    HPI: 71 year old female who returns today to discuss urinary urgency and frequency.  She has developed urgency, urge incontinence, and enuresis (about one per week) over the past year.    She does have a smoking history, quit 15 year ago.  1ppd x 30 years.    No gross hematuria.  No dysuria.    She was last seen in our office in 01/2015 at which time she was evaluated for recurrent urinary tract infections. She noted an increased frequency of infections after coming off of her estrogen patch (HRT).  She has since resumed this medication and not had a UTI since that time.    She has a personal history of breast cancer status post lumpectomy and 32.  She is drinking decaf green tea though the day along with several cups of coffee.   She does struggle with chronic constipation.   She has had issues with her gait and is being followed a neurologist.  Brain MRI negative.    PMH: Past Medical History:  Diagnosis Date  . Anemia   . Bipolar affective disorder ([place])    2  . Broken foot 2015   right foot  . Chronic gastritis   . Chronic kidney disease    Dr Holley Raring  . COPD (chronic obstructive pulmonary disease) ([place])   . Dizziness    inner ear (per pt) several times per week  . Dysphagia   . Glaucoma   . Hyperlipemia   . Hypertension    pt has recently come off BP meds.  MD aware. BP seems stable.  . Lithium toxicity 2015  . 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 Neck stiffness    s/p C3-C4 fusion, limited right turn  . Osteoarthritis    back  . Pancreatitis   . Personal history of malignant neoplasm of breast   . Pituitary  microadenoma ([place])   . Recurrent UTI   . Renal insufficiency   . Stomach ulcer 2112    Surgical History: Past Surgical History:  Procedure Laterality Date  . APPENDECTOMY    . BREAST BIOPSY Right 2011   neg  . BREAST EXCISIONAL BIOPSY Right 2001   neg  . BREAST EXCISIONAL BIOPSY Right 2008   breast ca 2008 radiation  . BREAST EXCISIONAL BIOPSY Left 10/26/2012   neg  . BREAST SURGERY Left  2013   left breast wide excision,Intraductal papilloma, ductal hyperplasia and sclerosing adenosis. Microcalcifications associated with columnar cell change. No evidence of atypia or malignancy. Margins are unremarkable.  Marland Kitchen BREAST SURGERY Right November 06, 1999   multiple areas of microcalcification showing evidence of sclerosing adenosis and ductal hyperplasia.  Marland Kitchen BREAST SURGERY Left May 07, 2007   wide excision.  . cataract Right 2013  . CATARACT EXTRACTION W/PHACO Left 03/05/2016   Procedure: CATARACT EXTRACTION PHACO AND INTRAOCULAR LENS PLACEMENT (IOC);  Surgeon: Leandrew Koyanagi, MD;  Location: [place];  Service: Ophthalmology;  Laterality: Left;  . COLONOSCOPY  2004  . COLONOSCOPY N/A 05/14/2015   Procedure: COLONOSCOPY;  Surgeon: Manya Silvas, MD;  Location: [facility] ENDOSCOPY;  Service: Endoscopy;  Laterality: N/A;  .  ERCP N/A   . ESOPHAGOGASTRODUODENOSCOPY N/A 05/14/2015   Procedure: ESOPHAGOGASTRODUODENOSCOPY (EGD);  Surgeon: Manya Silvas, MD;  Location: [facility]COPY;  Service: Endoscopy;  Laterality: N/A;  . EUS N/A 2011, 2012  . EYE SURGERY  2013  . GLAUCOMA SURGERY Right 2013  . LAPAROSCOPIC RIGHT COLECTOMY Right 06/22/2015   Procedure: LAPAROSCOPIC RIGHT COLECTOMY;  Surgeon: Robert Bellow, MD;  Location: [facility] ORS;  Service: General;  Laterality: Right;  . NECK SURGERY  2006  . right breast cancer    . SAVORY DILATION N/A 05/14/2015   Procedure: SAVORY DILATION;  Surgeon: Manya Silvas, MD;  Location: [facility] ENDOSCOPY;  Service: Endoscopy;  Laterality: N/A;    . TRABECULECTOMY Left 03/05/2016   Procedure: TRABECULECTOMY WITH [facility] AND EXPRESS SHUNT;  Surgeon: Leandrew Koyanagi, MD;  Location: [place];  Service: Ophthalmology;  Laterality: Left;  . TUBAL LIGATION      Home Medications:  Allergies as of 07/24/2017      Reactions   Influenza Vaccines Anaphylaxis   Ciprofloxacin    Yeast infection   Flu Virus Vaccine Other (See Comments)   Muscle spams   Sulfa Antibiotics Rash   Tetracyclines & Related Rash      Medication List       Accurate as of 07/24/17 11:59 PM. Always use your most recent med list.          ALPRAZolam 0.5 MG tablet Commonly known as:  XANAX Take 0.5 mg by mouth 2 (two) times daily as needed for sleep. 0.5-1 tablet   aspirin EC 81 MG tablet Take 1 tablet (81 mg total) by mouth daily.   AZOPT 1 % ophthalmic suspension Generic drug:  brinzolamide Place 1 drop into the left eye.   bisacodyl 5 MG EC tablet Commonly known as:  DULCOLAX Take 5 mg by mouth daily as needed for moderate constipation.   buPROPion 150 MG 12 hr tablet Commonly known as:  WELLBUTRIN SR Take 150 mg by mouth every morning. 2 tablets   COMBIGAN OP Apply 1 drop to eye 2 (two) times daily.   dextromethorphan 15 MG/5ML syrup Take 10 mLs by mouth 4 (four) times daily as needed for cough.   escitalopram 10 MG tablet Commonly known as:  LEXAPRO Take 10 mg by mouth daily.   esomeprazole 40 MG capsule Commonly known as:  NEXIUM Take 40 mg by mouth.   estradiol-levonorgestrel 0.045-0.015 MG/DAY Commonly known as:  CLIMARA PRO apply 1 patch every week   fluticasone furoate-vilanterol 100-25 MCG/INH Aepb Commonly known as:  BREO ELLIPTA Inhale 1 puff into the lungs daily.   gabapentin 100 MG capsule Commonly known as:  NEURONTIN Take 1-3 capsules (100-300 mg total) by mouth at bedtime.   ipratropium 0.06 % nasal spray Commonly known as:  ATROVENT Place 2 sprays into both nostrils 4 (four) times daily as needed for  rhinitis.   losartan 25 MG tablet Commonly known as:  COZAAR Take 25 mg by mouth daily.   LUMIGAN 0.01 % Soln Generic drug:  bimatoprost apply 1 drop to into both  eyes at bedtime once daily ONLY   mirabegron ER 25 MG Tb24 tablet Commonly known as:  MYRBETRIQ Take 1 tablet (25 mg total) by mouth daily.   nicotine polacrilex 2 MG gum Commonly known as:  NICORETTE Take 2 mg by mouth as needed for smoking cessation.   ondansetron 8 MG tablet Commonly known as:  ZOFRAN Take by mouth every 8 (eight) hours as needed for nausea  or vomiting.   ranitidine 150 MG tablet Commonly known as:  ZANTAC Take 150 mg by mouth daily.   rosuvastatin 10 MG tablet Commonly known as:  CRESTOR Take 10 mg by mouth at bedtime.   VENTOLIN HFA 108 (90 Base) MCG/ACT inhaler Generic drug:  albuterol inhale 2 puffs by mouth INTO THE LUNGS every 6 hours if needed for wheezing or shortness of breath       Allergies:  Allergies  Allergen Reactions  . Influenza Vaccines Anaphylaxis  . Ciprofloxacin     Yeast infection  . Flu Virus Vaccine Other (See Comments)    Muscle spams  . Sulfa Antibiotics Rash  . Tetracyclines & Related Rash    Family History: Family History  Problem Relation Age of Onset  . Breast cancer Mother 10  . Kidney cancer Neg Hx   . Bladder Cancer Neg Hx     Social History:  reports that she quit smoking about 9 years ago. She has a 35.00 pack-year smoking history. She has never used smokeless tobacco. She reports that she drinks alcohol. She reports that she does not use drugs.  ROS: UROLOGY Frequent Urination?: Yes Hard to postpone urination?: Yes Burning/pain with urination?: No Get up at night to urinate?: Yes Leakage of urine?: Yes Urine stream starts and stops?: Yes Trouble starting stream?: No Do you have to strain to urinate?: No Blood in urine?: No Urinary tract infection?: No Sexually transmitted disease?: No Injury to kidneys or bladder?: No Painful  intercourse?: Yes Weak stream?: No Currently pregnant?: No Vaginal bleeding?: Yes Last menstrual period?: n  Gastrointestinal Nausea?: No Vomiting?: No Indigestion/heartburn?: Yes Diarrhea?: Yes Constipation?: Yes  Constitutional Fever: No Night sweats?: No Weight loss?: No Fatigue?: Yes  Skin Skin rash/lesions?: No Itching?: No  Eyes Blurred vision?: No Double vision?: No  Ears/Nose/Throat Sore throat?: No Sinus problems?: No  Hematologic/Lymphatic Swollen glands?: No Easy bruising?: Yes  Cardiovascular Leg swelling?: No Chest pain?: No  Respiratory Cough?: No Shortness of breath?: Yes  Endocrine Excessive thirst?: No  Musculoskeletal Back pain?: Yes Joint pain?: Yes  Neurological Headaches?: No Dizziness?: Yes  Psychologic Depression?: Yes Anxiety?: Yes  Physical Exam: BP 136/64   Pulse 74   Ht 5\' 5"  (1.651 m)   Wt 105 lb (47.6 kg)   BMI 17.47 kg/m   Constitutional:  Alert and oriented, No acute distress. HEENT: [place] AT, moist mucus membranes.  Trachea midline, no masses. Cardiovascular: No clubbing, cyanosis, or edema. Respiratory: Normal respiratory effort, no increased work of breathing. GI: Abdomen is soft, nontender, nondistended, no abdominal masses GU: No CVA tenderness.  Skin: No rashes, bruises or suspicious lesions. Neurologic: Grossly intact, no focal deficits, moving all 4 extremities. Psychiatric: Normal mood and affect.  Laboratory Data: Lab Results  Component Value Date   WBC 8.7 06/25/2015   HGB 10.0 (L) 06/25/2015   HCT 31.1 (L) 06/25/2015   MCV 90.0 06/25/2015   PLT 206 06/25/2015    Lab Results  Component Value Date   CREATININE 0.99 03/19/2017    Urinalysis Results for orders placed or performed during the hospital encounter of 07/24/17  Urinalysis, Complete w Microscopic  Result Value Ref Range   Color, Urine YELLOW YELLOW   APPearance CLEAR CLEAR   Specific Gravity, Urine 1.015 1.005 - 1.030   pH  6.0 5.0 - 8.0   Glucose, UA NEGATIVE NEGATIVE mg/dL   Hgb urine dipstick NEGATIVE NEGATIVE   Bilirubin Urine NEGATIVE NEGATIVE   Ketones, ur NEGATIVE NEGATIVE mg/dL  Protein, ur NEGATIVE NEGATIVE mg/dL   Nitrite NEGATIVE NEGATIVE   Leukocytes, UA TRACE (A) NEGATIVE   Squamous Epithelial / LPF 0-5 (A) NONE SEEN   WBC, UA 6-30 0 - 5 WBC/hpf   RBC / HPF NONE SEEN 0 - 5 RBC/hpf   Bacteria, UA NONE SEEN NONE SEEN   Budding Yeast PRESENT     Pertinent Imaging: PVR  52 cc  Assessment & Plan:   1. Urge incontinence Behavioral modification discussed Recommend Mybetriq 25 mg in addition to above to see if this helps primarily with urinary frequency and also enuresis UA to rule out infection today Given her severe irritative voiding symptoms, recommend cystoscopy for further evaluation - BLADDER SCAN AMB NON-IMAGING  2. Urinary frequency As above  3. Enuresis Etiology unlear  4. Chronic idiopathic constipation Lengthy discussion today about importance good bowel hygiene Recommend daily stool softener as needed   Return in about 4 weeks (around 08/21/2017) for cysto/ symptoms recheck.  Hollice Espy, MD  [facility] [address] [place],  [address] [phone]  I spent 25 min with this patient of which greater than 50% was spent in counseling and coordination of care with the patient.

## 2017-07-29 ENCOUNTER — Ambulatory Visit
Admission: RE | Admit: 2017-07-29 | Discharge: 2017-07-29 | Disposition: A | Discharge status: Home / Self Care | Payer: Medicare Other | Source: Ambulatory Visit | Attending: Pain Medicine | Admitting: Pain Medicine

## 2017-07-29 ENCOUNTER — Encounter: Payer: Self-pay | Admitting: Pain Medicine

## 2017-07-29 ENCOUNTER — Ambulatory Visit: Payer: Medicare Other | Attending: Pain Medicine | Admitting: Pain Medicine

## 2017-07-29 VITALS — BP 140/62 | HR 97 | Temp 98.6°F | Resp 16 | Ht 65.0 in | Wt 103.0 lb

## 2017-07-29 DIAGNOSIS — Z881 Allergy status to other antibiotic agents status: Secondary | ICD-10-CM | POA: Insufficient documentation

## 2017-07-29 DIAGNOSIS — M21372 Foot drop, left foot: Secondary | ICD-10-CM | POA: Diagnosis not present

## 2017-07-29 DIAGNOSIS — M47896 Other spondylosis, lumbar region: Secondary | ICD-10-CM | POA: Diagnosis not present

## 2017-07-29 DIAGNOSIS — M1612 Unilateral primary osteoarthritis, left hip: Secondary | ICD-10-CM | POA: Insufficient documentation

## 2017-07-29 DIAGNOSIS — M533 Sacrococcygeal disorders, not elsewhere classified: Secondary | ICD-10-CM

## 2017-07-29 DIAGNOSIS — M5412 Radiculopathy, cervical region: Secondary | ICD-10-CM | POA: Diagnosis not present

## 2017-07-29 DIAGNOSIS — M19012 Primary osteoarthritis, left shoulder: Secondary | ICD-10-CM | POA: Diagnosis not present

## 2017-07-29 DIAGNOSIS — M5441 Lumbago with sciatica, right side: Secondary | ICD-10-CM | POA: Diagnosis not present

## 2017-07-29 DIAGNOSIS — G894 Chronic pain syndrome: Secondary | ICD-10-CM | POA: Insufficient documentation

## 2017-07-29 DIAGNOSIS — M545 Low back pain: Secondary | ICD-10-CM | POA: Diagnosis not present

## 2017-07-29 DIAGNOSIS — M25552 Pain in left hip: Secondary | ICD-10-CM | POA: Insufficient documentation

## 2017-07-29 DIAGNOSIS — M4696 Unspecified inflammatory spondylopathy, lumbar region: Secondary | ICD-10-CM | POA: Diagnosis not present

## 2017-07-29 DIAGNOSIS — M8938 Hypertrophy of bone, other site: Secondary | ICD-10-CM | POA: Diagnosis not present

## 2017-07-29 DIAGNOSIS — Z7982 Long term (current) use of aspirin: Secondary | ICD-10-CM | POA: Insufficient documentation

## 2017-07-29 DIAGNOSIS — M25551 Pain in right hip: Secondary | ICD-10-CM | POA: Diagnosis not present

## 2017-07-29 DIAGNOSIS — M5442 Lumbago with sciatica, left side: Secondary | ICD-10-CM

## 2017-07-29 DIAGNOSIS — E785 Hyperlipidemia, unspecified: Secondary | ICD-10-CM | POA: Insufficient documentation

## 2017-07-29 DIAGNOSIS — M19011 Primary osteoarthritis, right shoulder: Secondary | ICD-10-CM | POA: Diagnosis not present

## 2017-07-29 DIAGNOSIS — Z86010 Personal history of colonic polyps: Secondary | ICD-10-CM | POA: Diagnosis not present

## 2017-07-29 DIAGNOSIS — M79604 Pain in right leg: Secondary | ICD-10-CM | POA: Diagnosis not present

## 2017-07-29 DIAGNOSIS — G8929 Other chronic pain: Secondary | ICD-10-CM

## 2017-07-29 DIAGNOSIS — M5416 Radiculopathy, lumbar region: Secondary | ICD-10-CM | POA: Diagnosis not present

## 2017-07-29 DIAGNOSIS — Z79899 Other long term (current) drug therapy: Secondary | ICD-10-CM | POA: Diagnosis not present

## 2017-07-29 DIAGNOSIS — K861 Other chronic pancreatitis: Secondary | ICD-10-CM | POA: Insufficient documentation

## 2017-07-29 DIAGNOSIS — M79605 Pain in left leg: Secondary | ICD-10-CM | POA: Insufficient documentation

## 2017-07-29 DIAGNOSIS — J449 Chronic obstructive pulmonary disease, unspecified: Secondary | ICD-10-CM | POA: Diagnosis not present

## 2017-07-29 DIAGNOSIS — M25559 Pain in unspecified hip: Principal | ICD-10-CM

## 2017-07-29 DIAGNOSIS — M5136 Other intervertebral disc degeneration, lumbar region: Secondary | ICD-10-CM | POA: Insufficient documentation

## 2017-07-29 DIAGNOSIS — D352 Benign neoplasm of pituitary gland: Secondary | ICD-10-CM | POA: Insufficient documentation

## 2017-07-29 DIAGNOSIS — M4804 Spinal stenosis, thoracic region: Secondary | ICD-10-CM | POA: Insufficient documentation

## 2017-07-29 DIAGNOSIS — M5030 Other cervical disc degeneration, unspecified cervical region: Secondary | ICD-10-CM | POA: Insufficient documentation

## 2017-07-29 DIAGNOSIS — D649 Anemia, unspecified: Secondary | ICD-10-CM | POA: Diagnosis not present

## 2017-07-29 DIAGNOSIS — I129 Hypertensive chronic kidney disease with stage 1 through stage 4 chronic kidney disease, or unspecified chronic kidney disease: Secondary | ICD-10-CM | POA: Insufficient documentation

## 2017-07-29 DIAGNOSIS — Z87891 Personal history of nicotine dependence: Secondary | ICD-10-CM | POA: Insufficient documentation

## 2017-07-29 DIAGNOSIS — Z87440 Personal history of urinary (tract) infections: Secondary | ICD-10-CM | POA: Diagnosis not present

## 2017-07-29 DIAGNOSIS — N189 Chronic kidney disease, unspecified: Secondary | ICD-10-CM | POA: Insufficient documentation

## 2017-07-29 DIAGNOSIS — C50411 Malignant neoplasm of upper-outer quadrant of right female breast: Secondary | ICD-10-CM | POA: Insufficient documentation

## 2017-07-29 DIAGNOSIS — M47816 Spondylosis without myelopathy or radiculopathy, lumbar region: Secondary | ICD-10-CM | POA: Insufficient documentation

## 2017-07-29 DIAGNOSIS — M48061 Spinal stenosis, lumbar region without neurogenic claudication: Secondary | ICD-10-CM | POA: Insufficient documentation

## 2017-07-29 NOTE — Patient Instructions (Signed)

## 2017-07-29 NOTE — Progress Notes (Signed)
Patient's Name: Elaine Clayton  MRN: [id number]  Referring Provider: Idelle Crouch, MD  DOB: June 16, 1946  PCP: Idelle Crouch, MD  DOS: 07/29/2017  Note by: Gaspar Cola, MD  Service setting: Ambulatory outpatient  Specialty: Interventional Pain Management  Location: [facility] (AMB) Pain Management Facility    Patient type: Established   Primary Reason(s) for Visit: Encounter for post-procedure evaluation of chronic illness with mild to moderate exacerbation CC: Back Pain (left, lower) and Shoulder Pain (left)  HPI  Elaine Clayton is a 71 y.o. year old, female patient, who comes today for a post-procedure evaluation. She has Breast cancer of upper-outer quadrant of right female breast ([place]); Personal history of malignant neoplasm of breast; Colon polyp; Loss of weight; Cervical spine disease; Pituitary microadenoma ([place]); History of breast cancer in adulthood; Dysmetria; Breast cancer ([place]); Bipolar disorder ([place]); Chronic pancreatitis ([place]); COPD (chronic obstructive pulmonary disease) (HCC); HTN (hypertension); OA (osteoarthritis); Renal insufficiency; Chronic pain syndrome; Chronic shoulder pain (Fourth Area of Pain) (Bilateral) (L>R); Osteoarthritis of shoulder (Bilateral); Chronic neck pain (Tertiary Area of Pain) (Bilateral) (L>R); History of C3-5 ACDF; Numbness and tingling in left upper extremity; Numbness and tingling of right upper extremity; Upper extremity weakness (Bilateral) (L>R); Numbness of upper extremity (Bilateral) (L>R); Numbness and tingling of lower extremity (Bilateral) (L>R); Weakness of lower extremity (Bilateral) (R>L); Chronic abdominal pain (epigastric); Cervical syndrome; Closed fracture of metatarsal bone; 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); 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); Abnormal MRI, cervical spine; 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[place]); Cervical radiculitis; Chronic lumbar radiculopathy (Bilateral) (L>R) (left L5); Chronic low back pain  (Primary Area of Pain) (Bilateral) (L>R); Abnormal MRI, lumbar spine; 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); DDD (degenerative disc disease), cervical; Neurogenic pain; DDD (degenerative disc disease), lumbar; Lumbar foraminal stenosis (T12-S1, multilevel); Lumbar lateral recess stenosis (left L2-3, bilateral L3-4, & right L4-5); Grade 1 Anterolisthesis of L4 over L5 and L5 over S1; Lumbar facet hypertrophy (multilevel); Lumbar Ligamentum flavum hypertrophy (HCC) (multilevel); Dropfoot (Left) (L5 radiculopathy); Menopausal and female climacteric states; Impairment of balance; Lumbar facet joint syndrome (HCC) (P) (B) (L>R); Chronic sacroiliac joint pain (Bilateral) (L>R); Chronic hip pain (Bilateral) (L>R); and Chronic lower extremity pain (Secondary Area of Pain) (Bilateral) (L>R) on her problem list. Her primarily concern today is the Back Pain (left, lower) and Shoulder Pain (left)  Pain Assessment: Location: Left, Lower Back Radiating: n/a Onset: More than a month ago Duration: Chronic pain Quality: Throbbing Severity: 5 /10 (self-reported pain score)  Note: Reported level is compatible with observation.                   Effect on ADL: unable to exercise Timing: Constant Modifying factors: rest, heat  Elaine Clayton comes in today for post-procedure evaluation after the treatment done on 07/15/2017. Once again, the patient did not obtain any significant relief of the lower back from the dural steroid injection. However, today when we talked about her pain it would seem that the lower back is worse than the leg. Further examination today has revealed that she has bilateral lumbar facet pain, as well has bilateral SI joint and hip joint pain. Today we will order some x-rays of the hips and the SI to determine if there is anything in the area that we knew we need to be concerned about. I have also reviewed the notes from the neurosurgeon and it does not seem that there is anything that  he indicates being able to offer her. Because of  this, I will continue managing her pain with the goal of decrease in and hopefully improving her functionality. From the neurosurgeon's evaluation, I would seem that he agrees that there is some gait instability which cannot be explained by the cervical or lumbar MRIs.  Further details on both, my assessment(s), as well as the proposed treatment plan, please see below.  Post-Procedure Assessment  07/15/2017 Procedure: Therapeutic left-sided L4-5 interlaminar lumbar epidural steroid injection + left-sided L5-S1 transforaminal epidural steroid injection under fluoroscopic guidance and IV sedation Pre-procedure pain score:  5/10 Post-procedure pain score: 0/10 (100% relief) Influential Factors: BMI: 17.14 kg/m Intra-procedural challenges: None observed.         Assessment challenges: None detected.              Reported side-effects: None.        Post-procedural adverse reactions or complications: None reported         Sedation: Sedation provided. When no sedatives are used, the analgesic levels obtained are directly associated to the effectiveness of the local anesthetics. However, when sedation is provided, the level of analgesia obtained during the initial 1 hour following the intervention, is believed to be the result of a combination of factors. These factors may include, but are not limited to: 1. The effectiveness of the local anesthetics used. 2. The effects of the analgesic(s) and/or anxiolytic(s) used. 3. The degree of discomfort experienced by the patient at the time of the procedure. 4. The patients ability and reliability in recalling and recording the events. 5. The presence and influence of possible secondary gains and/or psychosocial factors. Reported result: Relief experienced during the 1st hour after the procedure: 90 % (Ultra-Short Term Relief)            Interpretative annotation: Clinically appropriate result. Analgesia during  this period is likely to be Local Anesthetic and/or IV Sedative (Analgesic/Anxiolytic) related.          Effects of local anesthetic: The analgesic effects attained during this period are directly associated to the localized infiltration of local anesthetics and therefore cary significant diagnostic value as to the etiological location, or anatomical origin, of the pain. Expected duration of relief is directly dependent on the pharmacodynamics of the local anesthetic used. Long-acting (4-6 hours) anesthetics used.  Reported result: Relief during the next 4 to 6 hour after the procedure: 90 % (Short-Term Relief)            Interpretative annotation: Clinically appropriate result. Analgesia during this period is likely to be Local Anesthetic-related.          Long-term benefit: Defined as the period of time past the expected duration of local anesthetics (1 hour for short-acting and 4-6 hours for long-acting). With the possible exception of prolonged sympathetic blockade from the local anesthetics, benefits during this period are typically attributed to, or associated with, other factors such as analgesic sensory neuropraxia, antiinflammatory effects, or beneficial biochemical changes provided by agents other than the local anesthetics.  Reported result: Extended relief following procedure: 25 % (Long-Term Relief)            Interpretative annotation: Clinically appropriate result. Good relief. No permanent benefit expected. Inflammation plays a part in the etiology to the pain.          Current benefits: Defined as persistent relief that continues at this point in time.   Reported results: Treated area: <25 % Elaine Clayton reports improvement in function Interpretative annotation: Recurrence of symptoms. No permanent benefit expected. Effective diagnostic  intervention.          Interpretation: Results would suggest a successful diagnostic intervention.                  Plan:  Please see "Plan of Care"  for details.  Laboratory Chemistry  Inflammation Markers (CRP: Acute Phase) (ESR: Chronic Phase) Lab Results  Component Value Date   CRP <0.8 03/19/2017   ESRSEDRATE 4 03/19/2017                 Renal Function Markers Lab Results  Component Value Date   BUN 12 03/19/2017   CREATININE 0.99 03/19/2017   GFRAA >60 03/19/2017   GFRNONAA 57 (L) 03/19/2017                 Hepatic Function Markers Lab Results  Component Value Date   AST 22 03/19/2017   ALT 14 03/19/2017   ALBUMIN 4.5 03/19/2017   ALKPHOS 53 03/19/2017                 Electrolytes Lab Results  Component Value Date   NA 139 03/19/2017   K 4.0 03/19/2017   CL 107 03/19/2017   CALCIUM 9.5 03/19/2017   MG 2.1 03/19/2017                 Neuropathy Markers Lab Results  Component Value Date   VITAMINB12 558 03/19/2017                 Bone Pathology Markers Lab Results  Component Value Date   ALKPHOS 53 03/19/2017   25OHVITD1 57 03/19/2017   25OHVITD2 1.2 03/19/2017   25OHVITD3 56 03/19/2017   CALCIUM 9.5 03/19/2017                 Coagulation Parameters Lab Results  Component Value Date   PLT 206 06/25/2015                 Cardiovascular Markers Lab Results  Component Value Date   HGB 10.0 (L) 06/25/2015   HCT 31.1 (L) 06/25/2015                 Note: Lab results reviewed.  Recent Diagnostic Imaging Review  No results found. Note: Imaging results reviewed.          Meds   Current Meds  Medication Sig  . ALPRAZolam (XANAX) 0.5 MG tablet Take 0.5 mg by mouth 2 (two) times daily as needed for sleep. 0.5-1 tablet  . aspirin EC 81 MG tablet Take 1 tablet (81 mg total) by mouth daily.  . AZOPT 1 % ophthalmic suspension Place 1 drop into the left eye.   . bisacodyl (DULCOLAX) 5 MG EC tablet Take 5 mg by mouth daily as needed for moderate constipation.  . Brimonidine Tartrate-Timolol (COMBIGAN OP) Apply 1 drop to eye 2 (two) times daily.  Marland Kitchen buPROPion (WELLBUTRIN SR) 150 MG 12 hr tablet Take  150 mg by mouth every morning. 2 tablets  . dextromethorphan 15 MG/5ML syrup Take 10 mLs by mouth 4 (four) times daily as needed for cough.  . escitalopram (LEXAPRO) 10 MG tablet Take 10 mg by mouth daily.   Marland Kitchen esomeprazole (NEXIUM) 40 MG capsule Take 40 mg by mouth.   . estradiol-levonorgestrel (CLIMARA PRO) 0.045-0.015 MG/DAY apply 1 patch every week  . fluticasone furoate-vilanterol (BREO ELLIPTA) 100-25 MCG/INH AEPB Inhale 1 puff into the lungs daily.  Marland Kitchen gabapentin (NEURONTIN) 100 MG capsule Take 1-3 capsules (100-300 mg total)  by mouth at bedtime.  Marland Kitchen ipratropium (ATROVENT) 0.06 % nasal spray Place 2 sprays into both nostrils 4 (four) times daily as needed for rhinitis.  Marland Kitchen losartan (COZAAR) 25 MG tablet Take 25 mg by mouth daily.  Marland Kitchen LUMIGAN 0.01 % SOLN apply 1 drop to into both  eyes at bedtime once daily ONLY  . mirabegron ER (MYRBETRIQ) 25 MG TB24 tablet Take 1 tablet (25 mg total) by mouth daily.  . nicotine polacrilex (NICORETTE) 2 MG gum Take 2 mg by mouth as needed for smoking cessation.  . ondansetron (ZOFRAN) 8 MG tablet Take by mouth every 8 (eight) hours as needed for nausea or vomiting.  . ranitidine (ZANTAC) 150 MG tablet Take 150 mg by mouth daily.  . rosuvastatin (CRESTOR) 10 MG tablet Take 10 mg by mouth at bedtime.   . VENTOLIN HFA 108 (90 Base) MCG/ACT inhaler inhale 2 puffs by mouth INTO THE LUNGS every 6 hours if needed for wheezing or shortness of breath    ROS  Constitutional: Denies any fever or chills Gastrointestinal: No reported hemesis, hematochezia, vomiting, or acute GI distress Musculoskeletal: Denies any acute onset joint swelling, redness, loss of ROM, or weakness Neurological: No reported episodes of acute onset apraxia, aphasia, dysarthria, agnosia, amnesia, paralysis, loss of coordination, or loss of consciousness  Allergies  Elaine Clayton is allergic to influenza vaccines; ciprofloxacin; flu virus vaccine; sulfa antibiotics; and tetracyclines &  related.  Elaine Clayton  Drug: Elaine Clayton  reports that she does not use drugs. Alcohol:  reports that she drinks alcohol. Tobacco:  reports that she quit smoking about 9 years ago. She has a 35.00 pack-year smoking history. She has never used smokeless tobacco. Medical:  has a past medical history of Anemia; Bipolar affective disorder ([place]); Broken foot (2015); Chronic gastritis; Chronic kidney disease; COPD (chronic obstructive pulmonary disease) ([place]); Dizziness; Dysphagia; Glaucoma; Hyperlipemia; Hypertension; Lithium toxicity (2015); Malignant neoplasm of upper-outer quadrant of female breast [facility]) (May 07, 2007); Neck stiffness; Osteoarthritis; Pancreatitis; Personal history of malignant neoplasm of breast; Pituitary microadenoma ([place]); Recurrent UTI; Renal insufficiency; and Stomach ulcer (2112). Surgical: Elaine Clayton  has a past surgical history that includes Colonoscopy (2004); Neck surgery (2006); Eye surgery (2013); Appendectomy; Tubal ligation; Colonoscopy (N/A, 05/14/2015); Esophagogastroduodenoscopy (N/A, 05/14/2015); Savory dilation (N/A, 05/14/2015); ERCP (N/A); Glaucoma surgery (Right, 2013); cataract (Right, 2013); Breast surgery (Left,  2013); Breast surgery (Right, November 06, 1999); Breast surgery (Left, May 07, 2007); EUS (N/A, 2011, 2012); Laparoscopic right colectomy (Right, 06/22/2015); right breast cancer; Trabeculectomy (Left, 03/05/2016); Cataract extraction w/PHACO (Left, 03/05/2016); Breast biopsy (Right, 2011); Breast excisional biopsy (Right, 2001); Breast excisional biopsy (Right, 2008); and Breast excisional biopsy (Left, 10/26/2012). Family: family history includes Breast cancer (age of onset: 89) in her mother.  Constitutional Exam  General appearance: Well nourished, well developed, and well hydrated. In no apparent acute distress Vitals:   07/29/17 0953  BP: 140/62  Pulse: 97  Resp: 16  Temp: 98.6 F (37 C)  TempSrc: Oral  SpO2: 100%  Weight: 103 lb (46.7 kg)  Height: 5'  5" (1.651 m)   BMI Assessment: Estimated body mass index is 17.14 kg/m as calculated from the following:   Height as of this encounter: '5\' 5"'$  (1.651 m).   Weight as of this encounter: 103 lb (46.7 kg).  BMI interpretation table: BMI level Category Range association with higher incidence of chronic pain  <18 kg/m2 Underweight   18.5-24.9 kg/m2 Ideal body weight   25-29.9 kg/m2 Overweight Increased incidence by 20%  30-34.9 kg/m2 Obese (Class I) Increased incidence by 68%  35-39.9 kg/m2 Severe obesity (Class II) Increased incidence by 136%  >40 kg/m2 Extreme obesity (Class III) Increased incidence by 254%   BMI Readings from Last 4 Encounters:  07/29/17 17.14 kg/m  07/24/17 17.47 kg/m  07/20/17 16.97 kg/m  07/15/17 17.16 kg/m   Wt Readings from Last 4 Encounters:  07/29/17 103 lb (46.7 kg)  07/24/17 105 lb (47.6 kg)  07/20/17 102 lb (46.3 kg)  07/15/17 100 lb (45.4 kg)  Psych/Mental status: Alert, oriented x 3 (person, place, & time)       Eyes: PERLA Respiratory: No evidence of acute respiratory distress  Cervical Spine Area Exam  Skin & Axial Inspection: No masses, redness, edema, swelling, or associated skin lesions Alignment: Symmetrical Functional ROM: Unrestricted ROM      Stability: No instability detected Muscle Tone/Strength: Functionally intact. No obvious neuro-muscular anomalies detected. Sensory (Neurological): Unimpaired Palpation: No palpable anomalies              Upper Extremity (UE) Exam    Side: Right upper extremity  Side: Left upper extremity  Skin & Extremity Inspection: Skin color, temperature, and hair growth are WNL. No peripheral edema or cyanosis. No masses, redness, swelling, asymmetry, or associated skin lesions. No contractures.  Skin & Extremity Inspection: Skin color, temperature, and hair growth are WNL. No peripheral edema or cyanosis. No masses, redness, swelling, asymmetry, or associated skin lesions. No contractures.  Functional ROM:  Unrestricted ROM          Functional ROM: Unrestricted ROM          Muscle Tone/Strength: Functionally intact. No obvious neuro-muscular anomalies detected.  Muscle Tone/Strength: Functionally intact. No obvious neuro-muscular anomalies detected.  Sensory (Neurological): Unimpaired  Sensory (Neurological): Unimpaired  Palpation: No palpable anomalies              Palpation: No palpable anomalies              Specialized Test(s): Deferred         Specialized Test(s): Deferred          Thoracic Spine Area Exam  Skin & Axial Inspection: No masses, redness, or swelling Alignment: Symmetrical Functional ROM: Unrestricted ROM Stability: No instability detected Muscle Tone/Strength: Functionally intact. No obvious neuro-muscular anomalies detected. Sensory (Neurological): Unimpaired Muscle strength & Tone: No palpable anomalies  Lumbar Spine Area Exam  Skin & Axial Inspection: No masses, redness, or swelling Alignment: Symmetrical Functional ROM: Restricted ROM      Stability: No instability detected Muscle Tone/Strength: Functionally intact. No obvious neuro-muscular anomalies detected. Sensory (Neurological): Movement-associated pain Palpation: Complains of area being tender to palpation       Provocative Tests: Lumbar Hyperextension and rotation test: Positive bilaterally for facet joint pain. Lumbar Lateral bending test: evaluation deferred today       Patrick's Maneuver: Positive for bilateral S-I arthralgia and for bilateral hip arthralgia  Gait & Posture Assessment  Ambulation: Patient ambulates using a cane Gait: Very limited, using assistive device to ambulate. She also have an unsteady gait. Posture: Antalgic   Lower Extremity Exam    Side: Right lower extremity  Side: Left lower extremity  Skin & Extremity Inspection: Skin color, temperature, and hair growth are WNL. No peripheral edema or cyanosis. No masses, redness, swelling, asymmetry, or associated skin lesions. No  contractures.  Skin & Extremity Inspection: Skin color, temperature, and hair growth are WNL. No peripheral edema or cyanosis. No masses, redness, swelling, asymmetry, or  associated skin lesions. No contractures.  Functional ROM: Decreased ROM for hip and knee joints  Functional ROM: Decreased ROM for hip and knee joints  Muscle Tone/Strength: Deconditioned  Muscle Tone/Strength: Deconditioned  Sensory (Neurological): Arthropathic arthralgia  Sensory (Neurological): Arthropathic arthralgia  Palpation: No palpable anomalies  Palpation: No palpable anomalies   Assessment  Primary Diagnosis & Pertinent Problem List: The primary encounter diagnosis was Chronic low back pain (Bilateral) (L>R). Diagnoses of Lumbar facet joint syndrome (HCC) (P) (B) (L>R), Lumbar facet hypertrophy (multilevel), Chronic sacroiliac joint pain (B) (L>R), and Chronic hip pain (Bilateral) (L>R) were also pertinent to this visit.  Status Diagnosis  Controlled Controlled Controlled 1. Chronic low back pain (Bilateral) (L>R)   2. Lumbar facet joint syndrome (HCC) (P) (B) (L>R)   3. Lumbar facet hypertrophy (multilevel)   4. Chronic sacroiliac joint pain (B) (L>R)   5. Chronic hip pain (Bilateral) (L>R)     Problems updated and reviewed during this visit: Problem  Lumbar facet joint syndrome (HCC) (P) (B) (L>R)  Chronic sacroiliac joint pain (Bilateral) (L>R)  Chronic hip pain (Bilateral) (L>R)  Chronic lower extremity pain (Secondary Area of Pain) (Bilateral) (L>R)  Chronic low back pain (Primary Area of Pain) (Bilateral) (L>R)  Chronic shoulder pain (Fourth Area of Pain) (Bilateral) (L>R)  Chronic neck pain (Tertiary Area of Pain) (Bilateral) (L>R)     Plan of Care  Pharmacotherapy (Medications Ordered): No orders of the defined types were placed in this encounter.  New Prescriptions   No medications on file   Medications administered today: Elaine Clayton had no medications administered during this  visit.   Procedure Orders     LUMBAR FACET(MEDIAL BRANCH NERVE BLOCK) MBNB     SACROILIAC JOINT INJECTINS   Lab Orders  No laboratory test(s) ordered today    Imaging Orders     DG HIP UNILAT W OR W/O PELVIS 2-3 VIEWS LEFT     DG HIP UNILAT W OR W/O PELVIS 2-3 VIEWS RIGHT     DG Si Joints   Referral Orders  No referral(s) requested today    Interventional management options: Planned, scheduled, and/or pending:   Diagnostic left vs. bilateral lumbar facet blockunder fluoro and IV sedation   Considering:   Diagnostic left cervical epidural steroid injectionunder fluoroscopic guidance  Diagnostic left L4-5 interlaminar lumbar epidural steroid injection Diagnostic left L5-S1 transforaminal epidural steroid injection Diagnostic bilateral lumbar facet block Possible bilateral lumbar facet RFA   Palliative PRN treatment(s):   Palliative left cervical epidural steroid injectionunder fluoroscopic guidance  Palliative left L4-5 interlaminar lumbar epidural steroid injection + left L5-S1 transforaminal   Provider-requested follow-up: Return for Procedure (with sedation): (B) L-FCT + S-I BLK.  Future Appointments Date Time Provider [place]  08/04/2017 11:00 AM Hillis Range, PT [facility] None  08/06/2017 1:00 PM Hillis Range, PT [facility] None  08/11/2017 9:15 AM Milinda Pointer, MD [facility] None  08/11/2017 1:00 PM Hillis Range, PT [facility] None  08/13/2017 1:00 PM Hillis Range, PT [facility] None  08/20/2017 11:15 AM Hillis Range, PT [facility] None  08/21/2017 10:00 AM Hollice Espy, MD [facility] None  08/25/2017 1:00 PM Hillis Range, PT [facility] None  08/27/2017 1:00 PM Hillis Range, PT [facility] None  09/01/2017 1:30 PM Hillis Range, PT [facility] None  09/03/2017 1:00 PM Hillis Range, PT [facility] None  09/08/2017 1:00 PM Hillis Range, PT [facility] None  09/10/2017 1:00 PM Hillis Range, PT  [facility] None  09/15/2017  11:15 AM Hillis Range, PT [facility] None  09/17/2017 1:00 PM Hillis Range, PT [facility] None  02/10/2018 1:30 PM Dohmeier, Asencion Partridge, MD [facility] None   Primary Care Physician: Idelle Crouch, MD Location: [facility] Note by: Gaspar Cola, MD Date: 07/29/2017; Time: 12:55 PM

## 2017-08-03 ENCOUNTER — Ambulatory Visit: Payer: Medicare Other | Admitting: Pain Medicine

## 2017-08-04 ENCOUNTER — Ambulatory Visit: Payer: Medicare Other | Attending: Pain Medicine | Admitting: Physical Therapy

## 2017-08-04 ENCOUNTER — Encounter: Payer: Self-pay | Admitting: Physical Therapy

## 2017-08-04 DIAGNOSIS — R278 Other lack of coordination: Secondary | ICD-10-CM | POA: Diagnosis present

## 2017-08-04 DIAGNOSIS — R2689 Other abnormalities of gait and mobility: Secondary | ICD-10-CM | POA: Diagnosis present

## 2017-08-04 DIAGNOSIS — M6281 Muscle weakness (generalized): Secondary | ICD-10-CM

## 2017-08-04 NOTE — Therapy (Signed)
[place] MAIN [facility] SERVICES [address] [place], [place], [address] Phone: [phone]   Fax:  [phone]  Physical Therapy Evaluation  Patient Details  Name: Elaine Clayton MRN: [id number] Date of Birth: 1946-06-03 Referring Provider: Dr. Joanna Hews   Encounter Date: 08/04/2017      PT End of Session - 08/04/17 1717    Visit Number 1   Number of Visits 17   Date for PT Re-Evaluation 09/29/17   Authorization Type G-Code 1   PT Start Time 1100   PT Stop Time 1210   PT Time Calculation (min) 70 min   Equipment Utilized During Treatment Gait belt   Activity Tolerance Patient tolerated treatment well   Behavior During Therapy [facility] for tasks assessed/performed;Impulsive      Past Medical History:  Diagnosis Date  . Anemia   . Bipolar affective disorder ([place])    2  . Broken foot 2015   right foot  . Chronic gastritis   . Chronic kidney disease    Dr Holley Raring  . COPD (chronic obstructive pulmonary disease) ([place])   . Dizziness    inner ear (per pt) several times per week  . Dysphagia   . Glaucoma   . Hyperlipemia   . Hypertension    pt has recently come off BP meds.  MD aware. BP seems stable.  . Lithium toxicity 2015  . 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 Neck stiffness    s/p C3-C4 fusion, limited right turn  . Osteoarthritis    back  . Pancreatitis   . Personal history of malignant neoplasm of breast   . Pituitary microadenoma ([place])   . Recurrent UTI   . Renal insufficiency   . Stomach ulcer 2112    Past Surgical History:  Procedure Laterality Date  . APPENDECTOMY    . BREAST BIOPSY Right 2011   neg  . BREAST EXCISIONAL BIOPSY Right 2001   neg  . BREAST EXCISIONAL BIOPSY Right 2008   breast ca 2008 radiation  . BREAST EXCISIONAL BIOPSY Left 10/26/2012   neg  . BREAST SURGERY Left  2013   left breast wide excision,Intraductal  papilloma, ductal hyperplasia and sclerosing adenosis. Microcalcifications associated with columnar cell change. No evidence of atypia or malignancy. Margins are unremarkable.  Marland Kitchen BREAST SURGERY Right November 06, 1999   multiple areas of microcalcification showing evidence of sclerosing adenosis and ductal hyperplasia.  Marland Kitchen BREAST SURGERY Left May 07, 2007   wide excision.  . cataract Right 2013  . CATARACT EXTRACTION W/PHACO Left 03/05/2016   Procedure: CATARACT EXTRACTION PHACO AND INTRAOCULAR LENS PLACEMENT (IOC);  Surgeon: Leandrew Koyanagi, MD;  Location: [place];  Service: Ophthalmology;  Laterality: Left;  . COLONOSCOPY  2004  . COLONOSCOPY N/A 05/14/2015   Procedure: COLONOSCOPY;  Surgeon: Manya Silvas, MD;  Location: [facility] ENDOSCOPY;  Service: Endoscopy;  Laterality: N/A;  . ERCP N/A   . ESOPHAGOGASTRODUODENOSCOPY N/A 05/14/2015   Procedure: ESOPHAGOGASTRODUODENOSCOPY (EGD);  Surgeon: Manya Silvas, MD;  Location: [facility] ENDOSCOPY;  Service: Endoscopy;  Laterality: N/A;  . EUS N/A 2011, 2012  . EYE SURGERY  2013  . GLAUCOMA SURGERY Right 2013  . LAPAROSCOPIC RIGHT COLECTOMY Right 06/22/2015   Procedure: LAPAROSCOPIC RIGHT COLECTOMY;  Surgeon: Robert Bellow, MD;  Location: [facility] ORS;  Service: General;  Laterality: Right;  . NECK SURGERY  2006  . right breast  cancer    . SAVORY DILATION N/A 05/14/2015   Procedure: SAVORY DILATION;  Surgeon: Manya Silvas, MD;  Location: [facility] ENDOSCOPY;  Service: Endoscopy;  Laterality: N/A;  . TRABECULECTOMY Left 03/05/2016   Procedure: TRABECULECTOMY WITH [facility] AND EXPRESS SHUNT;  Surgeon: Leandrew Koyanagi, MD;  Location: [place];  Service: Ophthalmology;  Laterality: Left;  . TUBAL LIGATION      There were no vitals filed for this visit.       Subjective Assessment - 08/04/17 1107    Subjective 71 y/o female reports that about 3 months ago she started stumbling and having more falls with the most recent last  Sunday down 4 stairs. She reports that she has had some numbness/tingling in LLE, mostly above the knee. She reports that her back pain has been bothering her more than her LE symptoms.   Pertinent History 71 yo Female with recent increase in back pain with subsequent numbness/tingling , weakness, and drop foot in B LEs, L>R starting about 3 months ago. She has had increased instability in gait and falls recently with minor injuries. Pt had recent epidural steroid injection L4-5, and L5-S1 with decrease in back pain since.Pt with extensive surgical and medical Hx including breast cancer (last surgery 2013), DJD with cervical fusion C3-5 in 2006, bipolar disorder, HTN, OA in bilateral shoulders L>R, hip and SIJ pain x >20 years.    Limitations Lifting;Standing;Walking;House hold activities   How long can you sit comfortably? Unlimited   How long can you stand comfortably? 20-30 minutes before balance gets worse   How long can you walk comfortably? 40' approx before drop foot or pain starts.    Diagnostic tests MRI 05/20/17: Chronic lumbar scoliosis with mild grade 1 spondylolisthesis from L3 L4 to L5-S1. Advanced disc and facet degeneration at those levels. Mild to moderate spinal and foraminal stenosis at L3-L4 and L4-L5. Right L5 radiculitis. Moderate neural foraminal stenosis also at the left L1 and both L2 nerve levels.   Patient Stated Goals To stop falling   Currently in Pain? No/denies   Pain Score 0-No pain  worst pain 7/10   Pain Location Back   Pain Orientation Lower   Pain Descriptors / Indicators Aching   Pain Type Chronic pain   Pain Radiating Towards Legs L>R    Pain Onset More than a month ago   Pain Frequency Intermittent   Pain Relieving Factors Morning is best, increases throughout the day   Effect of Pain on Daily Activities Decreased activity tolerance          [facility] PT Assessment - 08/05/17 0001      Assessment   Medical Diagnosis Balance deficits, bilateral LE pain    Referring Provider Dr. Joanna Hews    Onset Date/Surgical Date --  About 3 months   Hand Dominance Right   Next MD Visit Tomorrow    Prior Therapy Multiple times, last in 2013 for balance issues related to Lithium toxicity per pt; therapy had good outcome     Precautions   Precautions Fall   Precaution Comments Pt with multiple falls, recent fall down 4 steps with minor injury. Pt is eminent fall risk     Restrictions   Weight Bearing Restrictions No     Balance Screen   Has the patient fallen in the past 6 months Yes   How many times? 5-6   Has the patient had a decrease in activity level because of a fear of falling?  Yes  Is the patient reluctant to leave their home because of a fear of falling?  No     Home Environment   Living Environment Private residence   Living Arrangements Spouse/significant other   Type of [place] to enter   Entrance Stairs-Number of Steps 13   Entrance Stairs-Rails Right;Left;Can reach both   [place] level   Additional Comments Pt reports she has issues going down the stairs; recent fall down the stairs     Prior Function   Level of Independence Independent with basic ADLs   Vocation Retired     Associate Professor   Overall Cognitive Status Within Functional Limits for tasks assessed   Memory Impaired   Memory Impairment Decreased recall of new information   Executive Function Decision Making   Decision Making Impaired   Decision Making Impairment Functional complex   Behaviors Impulsive     Observation/Other Assessments   Oswestry Disability Index  40% disabled         STRENGTH:  Graded on a 0-5 scale Muscle Group Left Right      Hip Flex 2- 2  Hip Abd 3 3  Hip Add 3- 4-          Knee Flex 3+ 4-  Knee Ext 4- 4-  Ankle DF 2+ 3-       SENSATION:   Light touch: decreased sensation to light touch L>R, L3,4,5,S1/2 dermatomes; sensation to deep pressure appears intact in bilateral feet with gross  assessment.    Coordination: Impaired by gross assessment of gait and sit<>stand; pt has mildly ataxic movements with poor motor control when asked to increase gait speed; further assessment needed to differentiate weakness vs coordination.    PROM/AROM:  WFL with gross assessment, UE assumed hypomobile due to OA bilaterally. Pt with functional hip, pelvic, and LE mobility.  BALANCE:  Pt assessed in Romberg and tandem stance with eyes opened/closed; pt has impaired balance requiring minA to achieve position. She is able to maintain Romberg eyes open/closed while co-contracting/locking, but looses balance with any subsequent movement. Pt unable to maintain tandem consistently without assist. She is unable to withstand mild perturbation in any stance.  Static Standing Balance  Normal Able to maintain standing balance against maximal resistance   Good Able to maintain standing balance against moderate resistance   Good-/Fair+ Able to maintain standing balance against minimal resistance   Fair Able to stand unsupported without UE support and without LOB for 1-2 min X  Fair- Requires Min A and UE support to maintain standing without loss of balance   Poor+ Requires mod A and UE support to maintain standing without loss of balance   Poor Requires max A and UE support to maintain standing balance without loss     Standing Dynamic Balance  Normal Stand independently unsupported, able to weight shift and cross midline maximally   Good Stand independently unsupported, able to weight shift and cross midline moderately   Good-/Fair+ Stand independently unsupported, able to weight shift across midline minimally   Fair Stand independently unsupported, weight shift, and reach ipsilaterally, loss of balance when crossing midline   Poor+ Able to stand with Min A and reach ipsilaterally, unable to weight shift X  Poor Able to stand with Mod A and minimally reach ipsilaterally, unable to cross midline.           FUNCTIONAL MOBILITY:   Transfers: Pt requires UE assist for sit<>stand and requires increased time and  effort for transfer. During 5x sit<>stand she extended at hips and trunk with LOB and poor accuracy while sitting x 2/5 using seat back to control descent.    Gait: Pt ambulates with stagger gait with poor postural control and continual LOB requiring UE support and uncoordinated compensatory stepping and sway. Pt has decrease foot clearance bilaterally, worse on L that contribute to LOB. Pt appears to have limited awareness of her deficits when ambulating. Pt had improved gait with RW, see treatment above for details.   OUTCOME MEASURES: TEST Outcome Interpretation  5 times sit<>stand 22 sec >15 sec Indicates high fall risk.     10 meter walk test                 1.2m/s Indicated pt is community ambulator based on speed, however pt is very unstable at this speed a without AD requiring min A for balace.    Modified Oswestry 40% disabled Indicated moderate to severe disability related to back pain;  ABC scale 40% <50% low level of physical functioning            Objective measurements completed on examination: See above findings.     Treatment:  Gait training:   Pt assessed in gait with RW at normal walking speed and at max gait speed x 40' each. Gait speed was assessed to be the same with RW as without at 1.69m/s based on 17m walk test, however pt was much more stable with no compensatory stepping or staggering. Pt recommended to try Rollator at next visit due to time constraints. Patient concerned about using RW due to having to carry a lot of items regularly; Rollator may be a better option to allow basket to carry items. However will need assessment for safety with locking and unlocking; Pt educated on importance of decreasing fall risk with AD to prevent injury and further disability.                 PT Education - 08/04/17 1716    Education  provided Yes   Education Details Plan of care, fall risk reduction techniques, AD utilization/gait training;    Person(s) Educated Patient   Methods Explanation;Demonstration;Verbal cues   Comprehension Verbal cues required;Returned demonstration;Verbalized understanding          PT Short Term Goals - 08/05/17 0839      PT SHORT TERM GOAL #1   Title Patient will deny any falls over past 4 weeks to demonstrate improved safety awareness at home and work.    Baseline Fall 2 days ago   Time 4   Period Weeks   Status New   Target Date 09/02/17     PT SHORT TERM GOAL #2   Title Pt will obtain and appropriately use LRAD (Rollator suggested) in order to decraese fall risk and increase community access.    Baseline Pt does not use AD   Time 2   Period Weeks   Status New   Target Date 08/19/17     PT SHORT TERM GOAL #3   Title --           PT Long Term Goals - 08/04/17 1730      PT LONG TERM GOAL #1   Title Patient will be independent in home exercise program to improve strength/mobility for better functional independence with ADLs.   Baseline HEP not initiated   Time 8   Period Weeks   Status New   Target Date 09/29/17  PT LONG TERM GOAL #2   Title Patient will increase Berg Balance score by > 6 points to demonstrate decreased fall risk during functional activities.   Baseline Will assess next visit   Time 8   Period Weeks   Status New   Target Date 09/29/17     PT LONG TERM GOAL #3   Title Patient (> 25 years old) will complete five times sit to stand test in < 15 seconds indicating an increased LE strength and improved balance.   Baseline 22 sec indicating pt is high fall risk for her age   Time 65   Period Weeks   Status New   Target Date 09/30/17     PT LONG TERM GOAL #4   Title Patient will ascend/descend 4 stairs without rail assist independently without loss of balance to improve ability to get in/out of home.    Baseline Unsafe on stairs   Time 8    Period Weeks   Status New   Target Date 09/29/17     PT LONG TERM GOAL #5   Title Patient will increase BLE gross strength to 4/5 as to improve functional strength for independent gait, increased standing tolerance and increased ADL ability.   Baseline 2- to 4-/5 grossly   Time 8   Period Weeks   Status New   Target Date 09/29/17     Additional Long Term Goals   Additional Long Term Goals Yes     PT LONG TERM GOAL #6   Title --   Baseline --   Time --   Period --   Status --   Target Date --                Plan - 08/04/17 1718    Clinical Impression Statement 71 year old female presents to therapy with LBP and bilateral LE numbness/tingling and weakness. Pt assessed with modified oswestry, 5x sit<>stand test, 7m walk test, and high level balance screen indicating that she is at a high fall risk for her age despite having a high gait speed. She had decreased safety awareness and generally impulsive behavior that contribute to her fall risk. In addition, patient exhibits increased weakness in BLE which could contribute to imbalance and decreased mobility. She will benefit from skilled therapy at this time for strengthening, balance training, and assessment for AD to maximize functional mobility and safety.   History and Personal Factors relevant to plan of care: (-) Advanced age, chronicity of deficits, severity of deficits, poor safety awareness, high fall risk with Hx of falls (+) education, caregiver support, motivation/activity level   Clinical Presentation Unstable   Clinical Presentation due to: uncontrolled radiculopathy/ radicular pain, high fall risk with Hx of falls, multiple systems involved   Clinical Decision Making High   Rehab Potential Fair   Clinical Impairments Affecting Rehab Potential Pt does not use AD and had decreased knowledge of her limitations   PT Frequency 2x / week   PT Duration 8 weeks   PT Treatment/Interventions ADLs/Self Care Home  Management;Aquatic Therapy;Biofeedback;Canalith Repostioning;Cryotherapy;Moist Heat;Cognitive remediation;Neuromuscular re-education;Balance training;Therapeutic exercise;Therapeutic activities;Functional mobility training;Stair training;Gait training;DME Instruction;Patient/family education;Orthotic Fit/Training;Manual techniques;Passive range of motion;Visual/perceptual remediation/compensation;Vestibular;Energy conservation;Dry needling   PT Next Visit Plan Formally assess balance using Merrilee Jansky, assess vision, initiate HEP   PT Home Exercise Plan Research Rollator   Consulted and Agree with Plan of Care Patient      Patient will benefit from skilled therapeutic intervention in order to improve the following deficits and  impairments:  Abnormal gait, Decreased activity tolerance, Decreased balance, Decreased cognition, Decreased coordination, Decreased safety awareness, Decreased mobility, Decreased knowledge of use of DME, Decreased knowledge of precautions, Decreased endurance, Decreased skin integrity, Decreased strength, Difficulty walking, Dizziness, Postural dysfunction, Improper body mechanics, Impaired vision/preception, Impaired UE functional use, Impaired sensation, Hypomobility, Decreased range of motion, Impaired flexibility, Pain  Visit Diagnosis: Muscle weakness (generalized)  Other lack of coordination  Other abnormalities of gait and mobility      G-Codes - 2017/08/21 1700    Functional Assessment Tool Used (Outpatient Only) 5 times sit<>Stand, 10 meter walk, ABC scale, clinical judgement;    Functional Limitation Mobility: Walking and moving around   Mobility: Walking and Moving Around Current Status [phone]) At least 40 percent but less than 60 percent impaired, limited or restricted   Mobility: Walking and Moving Around Goal Status [phone]) At least 20 percent but less than 40 percent impaired, limited or restricted       Problem List Patient Active Problem List   Diagnosis  Date Noted  . Lumbar facet joint syndrome (HCC) (P) (B) (L>R) 07/29/2017  . Chronic sacroiliac joint pain (Bilateral) (L>R) 07/29/2017  . Chronic hip pain (Bilateral) (L>R) 07/29/2017  . Chronic lower extremity pain (Secondary Area of Pain) (Bilateral) (L>R) 07/29/2017  . Impairment of balance 07/07/2017  . Menopausal and female climacteric states 06/15/2017  . Lumbar foraminal stenosis (T12-S1, multilevel) 06/10/2017  . Lumbar lateral recess stenosis (left L2-3, bilateral L3-4, & right L4-5) 06/10/2017  . Grade 1 Anterolisthesis of L4 over L5 and L5 over S1 06/10/2017  . Lumbar facet hypertrophy (multilevel) 06/10/2017  . Lumbar Ligamentum flavum hypertrophy (HCC) (multilevel) 06/10/2017  . Dropfoot (Left) (L5 radiculopathy) 06/10/2017  . Neurogenic pain 06/09/2017  . DDD (degenerative disc disease), lumbar 06/09/2017  . 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 Closed fracture of metatarsal bone 05/06/2017  . 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 Abnormal MRI, cervical spine 05/06/2017  . 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[place]) 05/06/2017  . Cervical radiculitis 05/06/2017  . Chronic lumbar radiculopathy (Bilateral) (L>R) (left L5) 05/06/2017  . Chronic low back pain (Primary Area of Pain) (Bilateral) (L>R) 05/06/2017  . Abnormal MRI, lumbar spine 05/06/2017  . Chronic pain syndrome 03/19/2017  . Chronic shoulder pain (Fourth Area of Pain) (Bilateral) (L>R) 03/19/2017  . Osteoarthritis of shoulder (Bilateral) 03/19/2017  . Chronic neck pain [facility] Area of Pain) (Bilateral) (L>R) 03/19/2017  . History of C3-5 ACDF 03/19/2017  . Numbness and tingling in left upper extremity 03/19/2017  . Numbness and tingling of right upper extremity 03/19/2017  . Upper extremity weakness (Bilateral) (L>R) 03/19/2017  . Numbness of upper extremity (Bilateral) (L>R) 03/19/2017  . Numbness  and tingling of lower extremity (Bilateral) (L>R) 03/19/2017  . Weakness of lower extremity (Bilateral) (R>L) 03/19/2017  . Chronic abdominal pain (epigastric) 03/19/2017  . Cervical syndrome 03/19/2017  . Cervical spine disease 12/08/2016  . Pituitary microadenoma ([place]) 12/08/2016  . History of breast cancer in adulthood 12/08/2016  . Dysmetria 12/08/2016  . Loss of weight 08/15/2015  . Colon polyp 05/25/2015  . Personal history of malignant neoplasm of breast 07/18/2014  . Breast cancer ([place]) 06/28/2014  . Bipolar disorder ([place]) 06/28/2014  . Chronic pancreatitis ([place]) 06/28/2014  . HTN (hypertension) 06/28/2014  . OA (osteoarthritis) 06/28/2014  . Renal insufficiency 06/28/2014  . DDD (degenerative disc disease), cervical 06/28/2014  . COPD (chronic obstructive pulmonary disease) ([place])  05/17/2014  . Breast cancer of upper-outer quadrant of right female breast ([place]) 05/31/2008   Silvanna Ohmer, SPT This entire session was performed under direct supervision and direction of a licensed therapist/therapist assistant . I have personally read, edited and approve of the note as written.  Trotter,Margaret PT, DPT 08/05/2017, 9:30 AM  [place] MAIN [facility] SERVICES [address] [place], [place], [address] Phone: [phone]   Fax:  [phone]  Name: Elaine Clayton MRN: [id number] Date of Birth: Apr 13, 1946

## 2017-08-05 ENCOUNTER — Encounter: Payer: Self-pay | Admitting: Physical Therapy

## 2017-08-06 ENCOUNTER — Encounter: Payer: Self-pay | Admitting: Physical Therapy

## 2017-08-06 ENCOUNTER — Ambulatory Visit: Payer: Medicare Other | Admitting: Physical Therapy

## 2017-08-06 DIAGNOSIS — R2689 Other abnormalities of gait and mobility: Secondary | ICD-10-CM

## 2017-08-06 DIAGNOSIS — R278 Other lack of coordination: Secondary | ICD-10-CM

## 2017-08-06 DIAGNOSIS — M6281 Muscle weakness (generalized): Secondary | ICD-10-CM

## 2017-08-06 NOTE — Patient Instructions (Signed)
Knee Extension: Resisted (Sitting)   With band looped around right ankle and under other foot, straighten leg with ankle loop. Keep other leg bent to increase resistance. Repeat _10___ times per set. Do __2__ sets per session. Do _2___ sessions per day.  http://orth.exer.us/691   Copyright  VHI. All rights reserved.  ABDUCTION: Sitting - Exercise Ball: Resistance Band (Active)   Sit with feet flat. With band tied around both legs, Lift right leg slightly and, against resistance band, draw it out to side. Complete __2_ sets of __10_ repetitions. Perform _2__ sessions per day.  Copyright  VHI. All rights reserved.  FLEXION: Sitting - Resistance Band (Active)   Sit, both feet flat. Have band tied around both legs above knees, lift right knee toward ceiling.Repeat with other knee Complete _2__ sets of _10__ repetitions. Perform _2__ sessions per day.  http://gtsc.exer.us/21   Copyright  VHI. All rights reserved.  HIP / KNEE: Extension - Sit to Stand   Sitting, lean chest forward, raise hips up from surface. Straighten hips and knees. Weight bear equally on left and right sides. Backs of legs should not push off surface. __10_ reps per set, __2_ sets per day, _5__ days per week Use assistive device as needed.  Copyright  VHI. All rights reserved.  FLEXION: Sitting - Resistance Band (Active)   Sit with right foot flat. Have band tied around both feet, bend ankle, bringing toes toward head. Complete __2_ sets of __10_ repetitions. Perform _2__ sessions per day.  Copyright  VHI. All rights reserved.  Toe / Heel Raise (Sitting)   Sitting, raise heels, then rock back on heels and raise toes. Repeat _10___ times.  Copyright  VHI. All rights reserved.

## 2017-08-06 NOTE — Therapy (Signed)
[place] MAIN [facility] SERVICES [address] [place], [place], [address] Phone: [phone]   Fax:  [phone]  Physical Therapy Treatment  Patient Details  Name: Elaine Clayton MRN: [id number] Date of Birth: 1946-10-08 Referring Provider: Dr. Joanna Hews   Encounter Date: 08/06/2017      PT End of Session - 08/06/17 1357    Visit Number 2   Number of Visits 17   Date for PT Re-Evaluation 09/29/17   Authorization Type G-Code 2   PT Start Time 1300   PT Stop Time 1346   PT Time Calculation (min) 46 min   Equipment Utilized During Treatment Gait belt   Activity Tolerance Patient tolerated treatment well   Behavior During Therapy [facility] for tasks assessed/performed      Past Medical History:  Diagnosis Date  . Anemia   . Bipolar affective disorder ([place])    2  . Broken foot 2015   right foot  . Chronic gastritis   . Chronic kidney disease    Dr Holley Raring  . COPD (chronic obstructive pulmonary disease) ([place])   . Dizziness    inner ear (per pt) several times per week  . Dysphagia   . Glaucoma   . Hyperlipemia   . Hypertension    pt has recently come off BP meds.  MD aware. BP seems stable.  . Lithium toxicity 2015  . 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 Neck stiffness    s/p C3-C4 fusion, limited right turn  . Osteoarthritis    back  . Pancreatitis   . Personal history of malignant neoplasm of breast   . Pituitary microadenoma ([place])   . Recurrent UTI   . Renal insufficiency   . Stomach ulcer 2112    Past Surgical History:  Procedure Laterality Date  . APPENDECTOMY    . BREAST BIOPSY Right 2011   neg  . BREAST EXCISIONAL BIOPSY Right 2001   neg  . BREAST EXCISIONAL BIOPSY Right 2008   breast ca 2008 radiation  . BREAST EXCISIONAL BIOPSY Left 10/26/2012   neg  . BREAST SURGERY Left  2013   left breast wide excision,Intraductal papilloma,  ductal hyperplasia and sclerosing adenosis. Microcalcifications associated with columnar cell change. No evidence of atypia or malignancy. Margins are unremarkable.  Marland Kitchen BREAST SURGERY Right November 06, 1999   multiple areas of microcalcification showing evidence of sclerosing adenosis and ductal hyperplasia.  Marland Kitchen BREAST SURGERY Left May 07, 2007   wide excision.  . cataract Right 2013  . CATARACT EXTRACTION W/PHACO Left 03/05/2016   Procedure: CATARACT EXTRACTION PHACO AND INTRAOCULAR LENS PLACEMENT (IOC);  Surgeon: Leandrew Koyanagi, MD;  Location: [place];  Service: Ophthalmology;  Laterality: Left;  . COLONOSCOPY  2004  . COLONOSCOPY N/A 05/14/2015   Procedure: COLONOSCOPY;  Surgeon: Manya Silvas, MD;  Location: [facility] ENDOSCOPY;  Service: Endoscopy;  Laterality: N/A;  . ERCP N/A   . ESOPHAGOGASTRODUODENOSCOPY N/A 05/14/2015   Procedure: ESOPHAGOGASTRODUODENOSCOPY (EGD);  Surgeon: Manya Silvas, MD;  Location: [facility] ENDOSCOPY;  Service: Endoscopy;  Laterality: N/A;  . EUS N/A 2011, 2012  . EYE SURGERY  2013  . GLAUCOMA SURGERY Right 2013  . LAPAROSCOPIC RIGHT COLECTOMY Right 06/22/2015   Procedure: LAPAROSCOPIC RIGHT COLECTOMY;  Surgeon: Robert Bellow, MD;  Location: [facility] ORS;  Service: General;  Laterality: Right;  . NECK SURGERY  2006  . right breast  cancer    . SAVORY DILATION N/A 05/14/2015   Procedure: SAVORY DILATION;  Surgeon: Manya Silvas, MD;  Location: [facility] ENDOSCOPY;  Service: Endoscopy;  Laterality: N/A;  . TRABECULECTOMY Left 03/05/2016   Procedure: TRABECULECTOMY WITH [facility] AND EXPRESS SHUNT;  Surgeon: Leandrew Koyanagi, MD;  Location: [place];  Service: Ophthalmology;  Laterality: Left;  . TUBAL LIGATION      There were no vitals filed for this visit.      Subjective Assessment - 08/06/17 1301    Subjective Pt presents to therapy in good spirits; she reports that she is tired due to not sleeping well over the last few nights; she  believes that she is not sleeping well because she has not been walking as much as before.    Pertinent History 71 yo Female with recent increase in back pain with subsequent numbness/tingling , weakness, and drop foot in B LEs, L>R starting about 3 months ago. She has had increased instability in gait and falls recently with minor injuries. Pt had recent epidural steroid injection L4-5, and L5-S1 with decrease in back pain since.Pt with extensive surgical and medical Hx including breast cancer (last surgery 2013), DJD with cervical fusion C3-5 in 2006, bipolar disorder, HTN, OA in bilateral shoulders L>R, hip and SIJ pain x >20 years.    Limitations Lifting;Standing;Walking;House hold activities   How long can you sit comfortably? Unlimited   How long can you stand comfortably? 20-30 minutes before balance gets worse   How long can you walk comfortably? 40' approx before drop foot or pain starts.    Diagnostic tests MRI 05/20/17: Chronic lumbar scoliosis with mild grade 1 spondylolisthesis from L3 L4 to L5-S1. Advanced disc and facet degeneration at those levels. Mild to moderate spinal and foraminal stenosis at L3-L4 and L4-L5. Right L5 radiculitis. Moderate neural foraminal stenosis also at the left L1 and both L2 nerve levels.   Patient Stated Goals To stop falling   Currently in Pain? Yes   Pain Score 5    Pain Location Back   Pain Orientation Left;Lower   Pain Onset More than a month ago   Aggravating Factors  walking   Pain Relieving Factors Heating pad   Effect of Pain on Daily Activities decreased activity tolerance.            [facility] PT Assessment - 08/06/17 0001      Functional Gait  Assessment   Gait assessed  Yes   Gait Level Surface Walks 20 ft, slow speed, abnormal gait pattern, evidence for imbalance or deviates 10-15 in outside of the 12 in walkway width. Requires more than 7 sec to ambulate 20 ft.   Change in Gait Speed Able to change speed, demonstrates mild gait  deviations, deviates 6-10 in outside of the 12 in walkway width, or no gait deviations, unable to achieve a major change in velocity, or uses a change in velocity, or uses an assistive device.   Gait with Horizontal Head Turns Performs head turns with moderate changes in gait velocity, slows down, deviates 10-15 in outside 12 in walkway width but recovers, can continue to walk.   Gait with Vertical Head Turns Performs task with moderate change in gait velocity, slows down, deviates 10-15 in outside 12 in walkway width but recovers, can continue to walk.   Gait and Pivot Turn Turns slowly, requires verbal cueing, or requires several small steps to catch balance following turn and stop   Step Over Obstacle Is able  to step over one shoe box (4.5 in total height) but must slow down and adjust steps to clear box safely. May require verbal cueing.   Gait with Narrow Base of Support Ambulates less than 4 steps heel to toe or cannot perform without assistance.   Gait with Eyes Closed Walks 20 ft, slow speed, abnormal gait pattern, evidence for imbalance, deviates 10-15 in outside 12 in walkway width. Requires more than 9 sec to ambulate 20 ft.   Ambulating Backwards Walks 20 ft, slow speed, abnormal gait pattern, evidence for imbalance, deviates 10-15 in outside 12 in walkway width.   Steps Two feet to a stair, must use rail.   Total Score 10        Treatment:  Pt's balance assessed using FGA: Score = 10/30 indicating pt is very high fall risk; (<19 = fall risk)  Gait training:   Rollator x >200' with cues to identify playing cards placed at various heights on the wall to the left and right with horizontal head turns for dual task; pt had no LOB and increased stability with Rollator. She was able to control Rollator within her BOS and operate breaks appropriately. Pt continues to have limited safety awareness and bumped into chairs placed in the hallway x 2 requiring verbal cues for safety. Pt also  trained in the proper use of break application and safe sit<>stand technique with Rollator.   Ther-ex:   Sitting:   Knee extension resisted with yellow t-band 1 x 10 each with cue for slower eccentric motion and to flex contralateral knee to 90 degrees to increase t-band resistance   Hip abduction resisted with yellow t-band 1 x 5 each side with cues to isolate hip abduction motion. Pt reported she had more difficulty with the LLE   March alternating resisted with yellow t-band x 10 each side with cues for increased hip flexion ROM to increase muscle activation.     B ankle PF/DF alternating x 10 each with cues for increased muscle activation and 2 sec hold.  HEP given with handout with above exercises all for general LE strengthening to increase pt's activity tolerance, functional mobility and to decrease fall risk. Pt is able to perform all exercises with good technique.         PT Education - 08/06/17 1356    Education provided Yes   Education Details FGA assessed and interpreted, gait training, ther-ex with HEP given   Person(s) Educated Patient   Methods Demonstration;Explanation;Tactile cues;Verbal cues;Handout   Comprehension Tactile cues required;Verbal cues required;Returned demonstration;Verbalized understanding          PT Short Term Goals - 08/05/17 0839      PT SHORT TERM GOAL #1   Title Patient will deny any falls over past 4 weeks to demonstrate improved safety awareness at home and work.    Baseline Fall 2 days ago   Time 4   Period Weeks   Status New   Target Date 09/02/17     PT SHORT TERM GOAL #2   Title Pt will obtain and appropriately use LRAD (Rollator suggested) in order to decraese fall risk and increase community access.    Baseline Pt does not use AD   Time 2   Period Weeks   Status New   Target Date 08/19/17     PT SHORT TERM GOAL #3   Title --           PT Long Term Goals - 08/04/17 1730  PT LONG TERM GOAL #1   Title Patient  will be independent in home exercise program to improve strength/mobility for better functional independence with ADLs.   Baseline HEP not initiated   Time 8   Period Weeks   Status New   Target Date 09/29/17     PT LONG TERM GOAL #2   Title Patient will increase Berg Balance score by > 6 points to demonstrate decreased fall risk during functional activities.   Baseline Will assess next visit   Time 8   Period Weeks   Status New   Target Date 09/29/17     PT LONG TERM GOAL #3   Title Patient (> 57 years old) will complete five times sit to stand test in < 15 seconds indicating an increased LE strength and improved balance.   Baseline 22 sec indicating pt is high fall risk for her age   Time 19   Period Weeks   Status New   Target Date 09/30/17     PT LONG TERM GOAL #4   Title Patient will ascend/descend 4 stairs without rail assist independently without loss of balance to improve ability to get in/out of home.    Baseline Unsafe on stairs   Time 8   Period Weeks   Status New   Target Date 09/29/17     PT LONG TERM GOAL #5   Title Patient will increase BLE gross strength to 4/5 as to improve functional strength for independent gait, increased standing tolerance and increased ADL ability.   Baseline 2- to 4-/5 grossly   Time 8   Period Weeks   Status New   Target Date 09/29/17     Additional Long Term Goals   Additional Long Term Goals Yes     PT LONG TERM GOAL #6   Title --   Baseline --   Time --   Period --   Status --   Target Date --              Plan - 08/06/17 1358    Clinical Impression Statement Pt assessed in FGA today which indicates that she is at a very high fall risk requiring minA to prevent fall on multiple occasions. Pt led in gait training with Rollator today with dual tasking activities. pt performed well with improved stability and no LOB. Pt is able to operate Rollator breaks effectively and safely. PT recommends Rollator for pt at this  time to maximize pt's functional mobility due to Rollator having built in seat and pt requiring frequent sitting rest breaks. Pt will benefit from Rollator with storage bin to allow pt to have hands free when carrying items. Pt will continue to benefit from skilled therapy to maximize functional mobility and safety.    Rehab Potential Fair   Clinical Impairments Affecting Rehab Potential Pt does not use AD and had decreased knowledge of her limitations   PT Frequency 2x / week   PT Duration 8 weeks   PT Treatment/Interventions ADLs/Self Care Home Management;Aquatic Therapy;Biofeedback;Canalith Repostioning;Cryotherapy;Moist Heat;Cognitive remediation;Neuromuscular re-education;Balance training;Therapeutic exercise;Therapeutic activities;Functional mobility training;Stair training;Gait training;DME Instruction;Patient/family education;Orthotic Fit/Training;Manual techniques;Passive range of motion;Visual/perceptual remediation/compensation;Vestibular;Energy conservation;Dry needling   PT Next Visit Plan work on balance and strengthening;    PT Home Exercise Plan Research Rollator   Consulted and Agree with Plan of Care Patient      Patient will benefit from skilled therapeutic intervention in order to improve the following deficits and impairments:  Abnormal gait, Decreased activity tolerance, Decreased  balance, Decreased cognition, Decreased coordination, Decreased safety awareness, Decreased mobility, Decreased knowledge of use of DME, Decreased knowledge of precautions, Decreased endurance, Decreased skin integrity, Decreased strength, Difficulty walking, Dizziness, Postural dysfunction, Improper body mechanics, Impaired vision/preception, Impaired UE functional use, Impaired sensation, Hypomobility, Decreased range of motion, Impaired flexibility, Pain  Visit Diagnosis: Muscle weakness (generalized)  Other lack of coordination  Other abnormalities of gait and mobility     Problem  List Patient Active Problem List   Diagnosis Date Noted  . Lumbar facet joint syndrome (HCC) (P) (B) (L>R) 07/29/2017  . Chronic sacroiliac joint pain (Bilateral) (L>R) 07/29/2017  . Chronic hip pain (Bilateral) (L>R) 07/29/2017  . Chronic lower extremity pain (Secondary Area of Pain) (Bilateral) (L>R) 07/29/2017  . Impairment of balance 07/07/2017  . Menopausal and female climacteric states 06/15/2017  . Lumbar foraminal stenosis (T12-S1, multilevel) 06/10/2017  . Lumbar lateral recess stenosis (left L2-3, bilateral L3-4, & right L4-5) 06/10/2017  . Grade 1 Anterolisthesis of L4 over L5 and L5 over S1 06/10/2017  . Lumbar facet hypertrophy (multilevel) 06/10/2017  . Lumbar Ligamentum flavum hypertrophy (HCC) (multilevel) 06/10/2017  . Dropfoot (Left) (L5 radiculopathy) 06/10/2017  . Neurogenic pain 06/09/2017  . DDD (degenerative disc disease), lumbar 06/09/2017  . 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 Closed fracture of metatarsal bone 05/06/2017  . 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 Abnormal MRI, cervical spine 05/06/2017  . 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[place]) 05/06/2017  . Cervical radiculitis 05/06/2017  . Chronic lumbar radiculopathy (Bilateral) (L>R) (left L5) 05/06/2017  . Chronic low back pain (Primary Area of Pain) (Bilateral) (L>R) 05/06/2017  . Abnormal MRI, lumbar spine 05/06/2017  . Chronic pain syndrome 03/19/2017  . Chronic shoulder pain (Fourth Area of Pain) (Bilateral) (L>R) 03/19/2017  . Osteoarthritis of shoulder (Bilateral) 03/19/2017  . Chronic neck pain [facility] Area of Pain) (Bilateral) (L>R) 03/19/2017  . History of C3-5 ACDF 03/19/2017  . Numbness and tingling in left upper extremity 03/19/2017  . Numbness and tingling of right upper extremity 03/19/2017  . Upper extremity weakness (Bilateral) (L>R) 03/19/2017  . Numbness of upper  extremity (Bilateral) (L>R) 03/19/2017  . Numbness and tingling of lower extremity (Bilateral) (L>R) 03/19/2017  . Weakness of lower extremity (Bilateral) (R>L) 03/19/2017  . Chronic abdominal pain (epigastric) 03/19/2017  . Cervical syndrome 03/19/2017  . Cervical spine disease 12/08/2016  . Pituitary microadenoma ([place]) 12/08/2016  . History of breast cancer in adulthood 12/08/2016  . Dysmetria 12/08/2016  . Loss of weight 08/15/2015  . Colon polyp 05/25/2015  . Personal history of malignant neoplasm of breast 07/18/2014  . Breast cancer ([place]) 06/28/2014  . Bipolar disorder ([place]) 06/28/2014  . Chronic pancreatitis ([place]) 06/28/2014  . HTN (hypertension) 06/28/2014  . OA (osteoarthritis) 06/28/2014  . Renal insufficiency 06/28/2014  . DDD (degenerative disc disease), cervical 06/28/2014  . COPD (chronic obstructive pulmonary disease) ([place]) 05/17/2014  . Breast cancer of upper-outer quadrant of right female breast ([place]) 05/31/2008   Caedmon Louque Lenis Dickinson, SPT This entire session was performed under direct supervision and direction of a licensed therapist/therapist assistant . I have personally read, edited and approve of the note as written.  Trotter,Margaret PT, DPT 08/06/2017, 3:40 PM  [place] MAIN [facility] SERVICES [address] [place], [place], [address] Phone: [phone]   Fax:  [phone]  Name: Elaine Clayton MRN: [id number] Date of Birth: [birth year]

## 2017-08-11 ENCOUNTER — Encounter: Payer: Self-pay | Admitting: Pain Medicine

## 2017-08-11 ENCOUNTER — Ambulatory Visit (HOSPITAL_BASED_OUTPATIENT_CLINIC_OR_DEPARTMENT_OTHER): Payer: Medicare Other | Admitting: Pain Medicine

## 2017-08-11 ENCOUNTER — Ambulatory Visit: Payer: Medicare Other | Admitting: Physical Therapy

## 2017-08-11 ENCOUNTER — Ambulatory Visit
Admission: RE | Admit: 2017-08-11 | Discharge: 2017-08-11 | Disposition: A | Discharge status: Home / Self Care | Payer: Medicare Other | Source: Ambulatory Visit | Attending: Pain Medicine | Admitting: Pain Medicine

## 2017-08-11 VITALS — BP 162/75 | HR 82 | Temp 97.4°F | Resp 16 | Ht 65.0 in | Wt 102.0 lb

## 2017-08-11 DIAGNOSIS — M533 Sacrococcygeal disorders, not elsewhere classified: Secondary | ICD-10-CM

## 2017-08-11 DIAGNOSIS — M5442 Lumbago with sciatica, left side: Secondary | ICD-10-CM | POA: Insufficient documentation

## 2017-08-11 DIAGNOSIS — M4696 Unspecified inflammatory spondylopathy, lumbar region: Secondary | ICD-10-CM | POA: Insufficient documentation

## 2017-08-11 DIAGNOSIS — G8929 Other chronic pain: Secondary | ICD-10-CM | POA: Insufficient documentation

## 2017-08-11 DIAGNOSIS — M5441 Lumbago with sciatica, right side: Secondary | ICD-10-CM | POA: Diagnosis not present

## 2017-08-11 DIAGNOSIS — M47896 Other spondylosis, lumbar region: Secondary | ICD-10-CM | POA: Insufficient documentation

## 2017-08-11 DIAGNOSIS — M47816 Spondylosis without myelopathy or radiculopathy, lumbar region: Secondary | ICD-10-CM

## 2017-08-11 DIAGNOSIS — G894 Chronic pain syndrome: Secondary | ICD-10-CM

## 2017-08-11 MED ORDER — MIDAZOLAM HCL 5 MG/5ML IJ SOLN
1.0000 mg | INTRAMUSCULAR | Status: DC | PRN
Start: 2017-08-11 — End: 2017-08-11
  Administered 2017-08-11: 2 mg via INTRAVENOUS
  Filled 2017-08-11: qty 5

## 2017-08-11 MED ORDER — LIDOCAINE HCL (PF) 1.5 % IJ SOLN
20.0000 mL | Freq: Once | INTRAMUSCULAR | Status: DC
  Filled 2017-08-11: qty 20

## 2017-08-11 MED ORDER — METHYLPREDNISOLONE ACETATE 80 MG/ML IJ SUSP
80.0000 mg | Freq: Once | INTRAMUSCULAR | Status: AC
  Administered 2017-08-11: 80 mg via INTRA_ARTICULAR
  Filled 2017-08-11: qty 1

## 2017-08-11 MED ORDER — FENTANYL CITRATE (PF) 100 MCG/2ML IJ SOLN
25.0000 ug | INTRAMUSCULAR | Status: DC | PRN
  Administered 2017-08-11: 100 ug via INTRAVENOUS
  Filled 2017-08-11: qty 2

## 2017-08-11 MED ORDER — HYDROCODONE-ACETAMINOPHEN 5-325 MG PO TABS: 1.0000 | ORAL_TABLET | Freq: Four times a day (QID) | ORAL | 0 refills | Status: DC | PRN

## 2017-08-11 MED ORDER — ROPIVACAINE HCL 2 MG/ML IJ SOLN
9.0000 mL | Freq: Once | INTRAMUSCULAR | Status: AC
  Administered 2017-08-11: 10 mL via PERINEURAL
  Filled 2017-08-11: qty 10

## 2017-08-11 MED ORDER — TRIAMCINOLONE ACETONIDE 40 MG/ML IJ SUSP
40.0000 mg | Freq: Once | INTRAMUSCULAR | Status: AC
  Administered 2017-08-11: 40 mg
  Filled 2017-08-11: qty 1

## 2017-08-11 MED ORDER — LACTATED RINGERS IV SOLN
1000.0000 mL | Freq: Once | INTRAVENOUS | Status: AC
  Administered 2017-08-11: 1000 mL via INTRAVENOUS

## 2017-08-11 MED ORDER — ROPIVACAINE HCL 2 MG/ML IJ SOLN
9.0000 mL | Freq: Once | INTRAMUSCULAR | Status: AC
  Administered 2017-08-11: 10 mL via INTRA_ARTICULAR
  Filled 2017-08-11: qty 10

## 2017-08-11 NOTE — Progress Notes (Signed)
Safety precautions to be maintained throughout the outpatient stay will include: orient to surroundings, keep bed in low position, maintain call bell within reach at all times, provide assistance with transfer out of bed and ambulation.  

## 2017-08-11 NOTE — Progress Notes (Signed)
Patient's Name: Elaine Clayton  MRN: [id number]  Referring Provider: Idelle Crouch, MD  DOB: Nov 25, 1946  PCP: Idelle Crouch, MD  DOS: 08/11/2017  Note by: Gaspar Cola, MD  Service setting: Ambulatory outpatient  Specialty: Interventional Pain Management  Patient type: Established  Location: [facility] (AMB) Pain Management Facility  Visit type: Interventional Procedure   Primary Reason for Visit: Interventional Pain Management Treatment. CC: Back Pain (left, lower)  Procedure:  Anesthesia, Analgesia, Anxiolysis:  Procedure #1: Type: Diagnostic Medial Branch Facet Block Region: Lumbar Level: L2, L3, L4, L5, & S1 Medial Branch Level(s) Laterality: Bilateral  Procedure #2: Type: Diagnostic Sacroiliac Joint Block Region: Posterior Lumbosacral Level: PSIS (Posterior Superior Iliac Spine) Sacroiliac Joint Laterality: Bilateral  Type: Local Anesthesia with Moderate (Conscious) Sedation Local Anesthetic: Lidocaine 1% Route: Intravenous (IV) IV Access: Secured Sedation: Meaningful verbal contact was maintained at all times during the procedure  Indication(s): Analgesia and Anxiety  Indications: 1. Lumbar facet joint syndrome (Primary Area of Pain) (Bilateral) (L>R)   2. Lumbar facet hypertrophy (multilevel)   3. Chronic sacroiliac joint pain (Bilateral) (L>R)   4. Chronic low back pain (Primary Area of Pain) (Bilateral) (L>R)   5. Chronic pain syndrome    Pain Score: Pre-procedure: 4 /10 Post-procedure: 0-No pain/10  Pre-op Assessment:  Elaine Clayton is a 71 y.o. (year old), female patient, seen today for interventional treatment. She  has a past surgical history that includes Colonoscopy (2004); Neck surgery (2006); Eye surgery (2013); Appendectomy; Tubal ligation; Colonoscopy (N/A, 05/14/2015); Esophagogastroduodenoscopy (N/A, 05/14/2015); Savory dilation (N/A, 05/14/2015); ERCP (N/A); Glaucoma surgery (Right, 2013); cataract (Right, 2013); Breast surgery (Left,  2013); Breast  surgery (Right, November 06, 1999); Breast surgery (Left, May 07, 2007); EUS (N/A, 2011, 2012); Laparoscopic right colectomy (Right, 06/22/2015); right breast cancer; Trabeculectomy (Left, 03/05/2016); Cataract extraction w/PHACO (Left, 03/05/2016); Breast biopsy (Right, 2011); Breast excisional biopsy (Right, 2001); Breast excisional biopsy (Right, 2008); and Breast excisional biopsy (Left, 10/26/2012). Elaine Clayton has a current medication list which includes the following prescription(s): alprazolam, aspirin ec, azopt, bisacodyl, brimonidine tartrate-timolol, bupropion, dextromethorphan, escitalopram, esomeprazole, estradiol-levonorgestrel, fluticasone furoate-vilanterol, gabapentin, hydrocodone-acetaminophen, ipratropium, lido-capsaicin-men-methyl sal, losartan, lumigan, mirabegron er, nicotine polacrilex, ondansetron, ranitidine, rosuvastatin, and ventolin hfa, and the following Facility-Administered Medications: fentanyl, lidocaine, lidocaine, and midazolam. Her primarily concern today is the Back Pain (left, lower)  Initial Vital Signs: There were no vitals taken for this visit. BMI: Estimated body mass index is 16.97 kg/m as calculated from the following:   Height as of this encounter: 5\' 5"  (1.651 m).   Weight as of this encounter: 102 lb (46.3 kg).  Risk Assessment: Allergies: Reviewed. She is allergic to influenza vaccines; ciprofloxacin; flu virus vaccine; sulfa antibiotics; and tetracyclines & related.  Allergy Precautions: None required Coagulopathies: Reviewed. None identified.  Blood-thinner therapy: None at this time Active Infection(s): Reviewed. None identified. Elaine Clayton is afebrile  Site Confirmation: Ms. Moustafa was asked to confirm the procedure and laterality before marking the site Procedure checklist: Completed Consent: Before the procedure and under the influence of no sedative(s), amnesic(s), or anxiolytics, the patient was informed of the treatment options, risks and  possible complications. To fulfill our ethical and legal obligations, as recommended by the American Medical Association's Code of Ethics, I have informed the patient of my clinical impression; the nature and purpose of the treatment or procedure; the risks, benefits, and possible complications of the intervention; the alternatives, including doing nothing; the risk(s) and benefit(s) of the alternative treatment(s) or procedure(s); and the risk(s)  and benefit(s) of doing nothing. The patient was provided information about the general risks and possible complications associated with the procedure. These may include, but are not limited to: failure to achieve desired goals, infection, bleeding, organ or nerve damage, allergic reactions, paralysis, and death. In addition, the patient was informed of those risks and complications associated to Spine-related procedures, such as failure to decrease pain; infection (i.e.: Meningitis, epidural or intraspinal abscess); bleeding (i.e.: epidural hematoma, subarachnoid hemorrhage, or any other type of intraspinal or peri-dural bleeding); organ or nerve damage (i.e.: Any type of peripheral nerve, nerve root, or spinal cord injury) with subsequent damage to sensory, motor, and/or autonomic systems, resulting in permanent pain, numbness, and/or weakness of one or several areas of the body; allergic reactions; (i.e.: anaphylactic reaction); and/or death. Furthermore, the patient was informed of those risks and complications associated with the medications. These include, but are not limited to: allergic reactions (i.e.: anaphylactic or anaphylactoid reaction(s)); adrenal axis suppression; blood sugar elevation that in diabetics may result in ketoacidosis or comma; water retention that in patients with history of congestive heart failure may result in shortness of breath, pulmonary edema, and decompensation with resultant heart failure; weight gain; swelling or edema;  medication-induced neural toxicity; particulate matter embolism and blood vessel occlusion with resultant organ, and/or nervous system infarction; and/or aseptic necrosis of one or more joints. Finally, the patient was informed that Medicine is not an exact science; therefore, there is also the possibility of unforeseen or unpredictable risks and/or possible complications that may result in a catastrophic outcome. The patient indicated having understood very clearly. We have given the patient no guarantees and we have made no promises. Enough time was given to the patient to ask questions, all of which were answered to the patient's satisfaction. Ms. Vitale has indicated that she wanted to continue with the procedure. Attestation: I, the ordering provider, attest that I have discussed with the patient the benefits, risks, side-effects, alternatives, likelihood of achieving goals, and potential problems during recovery for the procedure that I have provided informed consent. Date: 08/11/2017; Time: 8:13 AM  Pre-Procedure Preparation:  Monitoring: As per clinic protocol. Respiration, ETCO2, SpO2, BP, heart rate and rhythm monitor placed and checked for adequate function Safety Precautions: Patient was assessed for positional comfort and pressure points before starting the procedure. Time-out: I initiated and conducted the "Time-out" before starting the procedure, as per protocol. The patient was asked to participate by confirming the accuracy of the "Time Out" information. Verification of the correct person, site, and procedure were performed and confirmed by me, the nursing staff, and the patient. "Time-out" conducted as per Joint Commission's Universal Protocol (UP.01.01.01). "Time-out" Date & Time: 08/11/2017; 1026 hrs.  Description of Procedure #1 Process:   Time-out: "Time-out" completed before starting procedure, as per protocol. Position: Prone Target Area: For Lumbar Facet blocks, the target is  the groove formed by the junction of the transverse process and superior articular process. For the L5 dorsal ramus, the target is the notch between superior articular process and sacral ala. For the S1 dorsal ramus, the target is the superior and lateral edge of the posterior S1 Sacral foramen. Approach: Paramedial approach. Area Prepped: Entire Posterior Lumbosacral Region Prepping solution: ChloraPrep (2% chlorhexidine gluconate and 70% isopropyl alcohol) Safety Precautions: Aspiration looking for blood return was conducted prior to all injections. At no point did we inject any substances, as a needle was being advanced. No attempts were made at seeking any paresthesias. Safe injection practices and  needle disposal techniques used. Medications properly checked for expiration dates. SDV (single dose vial) medications used.  Description of the Procedure: Protocol guidelines were followed. The patient was placed in position over the fluoroscopy table. The target area was identified and the area prepped in the usual manner. Skin desensitized using vapocoolant spray. Skin & deeper tissues infiltrated with local anesthetic. Appropriate amount of time allowed to pass for local anesthetics to take effect. The procedure needle was introduced through the skin, ipsilateral to the reported pain, and advanced to the target area. Employing the "Medial Branch Technique", the needles were advanced to the angle made by the superior and medial portion of the transverse process, and the lateral and inferior portion of the superior articulating process of the targeted vertebral bodies. This area is known as "Burton's Eye" or the "Eye of the [place] Dog". A procedure needle was introduced through the skin, and this time advanced to the angle made by the superior and medial border of the sacral ala, and the lateral border of the S1 vertebral body. This last needle was later repositioned at the superior and lateral border of the  posterior S1 foramen. Negative aspiration confirmed. Solution injected in intermittent fashion, asking for systemic symptoms every 0.5cc of injectate. The needles were then removed and the area cleansed, making sure to leave some of the prepping solution back to take advantage of its long term bactericidal properties. Start Time: 1026 hrs. Materials:  Needle(s) Type: Regular needle Gauge: 22G Length: 3.5-in Medication(s): We administered lactated ringers, midazolam, fentaNYL, triamcinolone acetonide, ropivacaine (PF) 2 mg/mL (0.2%), ropivacaine (PF) 2 mg/mL (0.2%), triamcinolone acetonide, methylPREDNISolone acetate, and ropivacaine (PF) 2 mg/mL (0.2%). Please see chart orders for dosing details.  Description of Procedure # 2 Process:   Position: Prone Target Area: For upper sacroiliac joint block(s), the target is the superior and posterior margin of the sacroiliac joint. Approach: Ipsilateral approach. Area Prepped: Entire Posterior Lumbosacral Region Prepping solution: ChloraPrep (2% chlorhexidine gluconate and 70% isopropyl alcohol) Safety Precautions: Aspiration looking for blood return was conducted prior to all injections. At no point did we inject any substances, as a needle was being advanced. No attempts were made at seeking any paresthesias. Safe injection practices and needle disposal techniques used. Medications properly checked for expiration dates. SDV (single dose vial) medications used. Description of the Procedure: Protocol guidelines were followed. The patient was placed in position over the fluoroscopy table. The target area was identified and the area prepped in the usual manner. Skin desensitized using vapocoolant spray. Skin & deeper tissues infiltrated with local anesthetic. Appropriate amount of time allowed to pass for local anesthetics to take effect. The procedure needle was advanced under fluoroscopic guidance into the sacroiliac joint until a firm endpoint was obtained.  Proper needle placement secured. Negative aspiration confirmed. Solution injected in intermittent fashion, asking for systemic symptoms every 0.5cc of injectate. The needles were then removed and the area cleansed, making sure to leave some of the prepping solution back to take advantage of its long term bactericidal properties. Vitals:   08/11/17 1041 08/11/17 1051 08/11/17 1101 08/11/17 1111  BP: (!) 150/88 138/65 (!) 167/72 (!) 162/75  Pulse:      Resp: 17 16 16 16   Temp:   (!) 97.4 F (36.3 C)   TempSrc:      SpO2: 97% 96% 96% 99%  Weight:      Height:        End Time: 1041 hrs. Materials:  Needle(s) Type: Regular needle  Gauge: 22G Length: 3.5-in Medication(s): We administered lactated ringers, midazolam, fentaNYL, triamcinolone acetonide, ropivacaine (PF) 2 mg/mL (0.2%), ropivacaine (PF) 2 mg/mL (0.2%), triamcinolone acetonide, methylPREDNISolone acetate, and ropivacaine (PF) 2 mg/mL (0.2%). Please see chart orders for dosing details.  Imaging Guidance (Spinal):  Type of Imaging Technique: Fluoroscopy Guidance (Spinal) Indication(s): Assistance in needle guidance and placement for procedures requiring needle placement in or near specific anatomical locations not easily accessible without such assistance. Exposure Time: Please see nurses notes. Contrast: None used. Fluoroscopic Guidance: I was personally present during the use of fluoroscopy. "Tunnel Vision Technique" used to obtain the best possible view of the target area. Parallax error corrected before commencing the procedure. "Direction-depth-direction" technique used to introduce the needle under continuous pulsed fluoroscopy. Once target was reached, antero-posterior, oblique, and lateral fluoroscopic projection used confirm needle placement in all planes. Images permanently stored in EMR. Interpretation: No contrast injected. I personally interpreted the imaging intraoperatively. Adequate needle placement confirmed in multiple  planes. Permanent images saved into the patient's record.  Antibiotic Prophylaxis:  Indication(s): None identified Antibiotic given: None  Post-operative Assessment:  EBL: None Complications: No immediate post-treatment complications observed by team, or reported by patient. Note: The patient tolerated the entire procedure well. A repeat set of vitals were taken after the procedure and the patient was kept under observation following institutional policy, for this type of procedure. Post-procedural neurological assessment was performed, showing return to baseline, prior to discharge. The patient was provided with post-procedure discharge instructions, including a section on how to identify potential problems. Should any problems arise concerning this procedure, the patient was given instructions to immediately contact us, at any time, without hesitation. In any case, we plan to contact the patient by telephone for a follow-up status report regarding this interventional procedure. Comments:  No additional relevant information.  Plan of Care  Disposition: Discharge home  Discharge Date & Time: 08/11/2017; 1116 hrs.  Physician-requested Follow-up:  Return for post-procedure eval by Dr. Dossie Arbour in 2 weeks.  New Prescriptions   No medications on file   Future Appointments Date Time Provider [place]  08/13/2017 1:00 PM Hillis Range, PT [facility] None  08/20/2017 11:15 AM Hillis Range, PT [facility] None  08/21/2017 10:00 AM Hollice Espy, MD [facility] None  08/25/2017 1:00 PM Hillis Range, PT [facility] None  08/27/2017 1:00 PM Hillis Range, PT [facility] None  09/01/2017 1:30 PM Hillis Range, PT [facility] None  09/03/2017 1:00 PM Hillis Range, PT [facility] None  09/08/2017 1:00 PM Hillis Range, PT [facility] None  09/09/2017 9:15 AM Milinda Pointer, MD [facility] None  09/10/2017 1:00 PM Hillis Range, PT [facility] None  09/15/2017 11:15  AM Hillis Range, PT [facility] None  09/17/2017 1:00 PM Hillis Range, PT [facility] None  02/10/2018 1:30 PM Dohmeier, Asencion Partridge, MD [facility] None    Imaging Orders     DG C-Arm 1-60 Min-No Report  Procedure Orders     LUMBAR FACET(MEDIAL BRANCH NERVE BLOCK) MBNB     SACROILIAC JOINT INJECTINS  Medications ordered for procedure: Meds ordered this encounter  Medications  . lactated ringers infusion 1,000 mL  . midazolam (VERSED) 5 MG/5ML injection 1-2 mg    Make sure Flumazenil is available in the pyxis when using this medication. If oversedation occurs, administer 0.2 mg IV over 15 sec. If after 45 sec no response, administer 0.2 mg again over 1 min; may repeat at 1 min intervals; not to exceed 4 doses (1 mg)  .  fentaNYL (SUBLIMAZE) injection 25-50 mcg    Make sure Narcan is available in the pyxis when using this medication. In the event of respiratory depression (RR< 8/min): Titrate NARCAN (naloxone) in increments of 0.1 to 0.2 mg IV at 2-3 minute intervals, until desired degree of reversal.  . lidocaine 1.5 % injection 20 mL    From block tray  . triamcinolone acetonide (KENALOG-40) injection 40 mg  . ropivacaine (PF) 2 mg/mL (0.2%) (NAROPIN) injection 9 mL  . ropivacaine (PF) 2 mg/mL (0.2%) (NAROPIN) injection 9 mL  . triamcinolone acetonide (KENALOG-40) injection 40 mg  . lidocaine 1.5 % injection 20 mL    From block tray.  . methylPREDNISolone acetate (DEPO-MEDROL) injection 80 mg  . ropivacaine (PF) 2 mg/mL (0.2%) (NAROPIN) injection 9 mL  . HYDROcodone-acetaminophen (NORCO/VICODIN) 5-325 MG tablet    Sig: Take 1 tablet by mouth every 6 (six) hours as needed for moderate pain.    Dispense:  60 tablet    Refill:  0    Fill one day early if pharmacy is closed on scheduled refill date. Do not fill until: 08/11/17 To last until: 09/10/17   Medications administered: We administered lactated ringers, midazolam, fentaNYL, triamcinolone acetonide, ropivacaine (PF) 2 mg/mL  (0.2%), ropivacaine (PF) 2 mg/mL (0.2%), triamcinolone acetonide, methylPREDNISolone acetate, and ropivacaine (PF) 2 mg/mL (0.2%).  See the medical record for exact dosing, route, and time of administration.  Primary Care Physician: Idelle Crouch, MD Location: [facility] Note by: Gaspar Cola, MD Date: 08/11/2017; Time: 11:37 AM  Disclaimer:  Medicine is not an exact science. The only guarantee in medicine is that nothing is guaranteed. It is important to note that the decision to proceed with this intervention was based on the information collected from the patient. The Data and conclusions were drawn from the patient's questionnaire, the interview, and the physical examination. Because the information was provided in large part by the patient, it cannot be guaranteed that it has not been purposely or unconsciously manipulated. Every effort has been made to obtain as much relevant data as possible for this evaluation. It is important to note that the conclusions that lead to this procedure are derived in large part from the available data. Always take into account that the treatment will also be dependent on availability of resources and existing treatment guidelines, considered by other Pain Management Practitioners as being common knowledge and practice, at the time of the intervention. For Medico-Legal purposes, it is also important to point out that variation in procedural techniques and pharmacological choices are the acceptable norm. The indications, contraindications, technique, and results of the above procedure should only be interpreted and judged by a Board-Certified Interventional Pain Specialist with extensive familiarity and expertise in the same exact procedure and technique.

## 2017-08-11 NOTE — Patient Instructions (Addendum)
____________________________________________________________________________________________  Post-Procedure instructions Instructions:  Apply ice: Fill a plastic sandwich bag with crushed ice. Cover it with a small towel and apply to injection site. Apply for 15 minutes then remove x 15 minutes. Repeat sequence on day of procedure, until you go to bed. The purpose is to minimize swelling and discomfort after procedure.  Apply heat: Apply heat to procedure site starting the day following the procedure. The purpose is to treat any soreness and discomfort from the procedure.  Food intake: Start with clear liquids (like water) and advance to regular food, as tolerated.   Physical activities: Keep activities to a minimum for the first 8 hours after the procedure.   Driving: If you have received any sedation, you are not allowed to drive for 24 hours after your procedure.  Blood thinner: Restart your blood thinner 6 hours after your procedure. (Only for those taking blood thinners)  Insulin: As soon as you can eat, you may resume your normal dosing schedule. (Only for those taking insulin)  Infection prevention: Keep procedure site clean and dry.  Post-procedure Pain Diary: Extremely important that this be done correctly and accurately. Recorded information will be used to determine the next step in treatment.  Pain evaluated is that of treated area only. Do not include pain from an untreated area.  Complete every hour, on the hour, for the initial 8 hours. Set an alarm to help you do this part accurately.  Do not go to sleep and have it completed later. It will not be accurate.  Follow-up appointment: Keep your follow-up appointment after the procedure. Usually 2 weeks for most procedures. (6 weeks in the case of radiofrequency.) Bring you pain diary.  Expect:  From numbing medicine (AKA: Local Anesthetics): Numbness or decrease in pain.  Onset: Full effect within 15 minutes of  injected.  Duration: It will depend on the type of local anesthetic used. On the average, 1 to 8 hours.   From steroids: Decrease in swelling or inflammation. Once inflammation is improved, relief of the pain will follow.  Onset of benefits: Depends on the amount of swelling present. The more swelling, the longer it will take for the benefits to be seen. In some cases, up to 10 days.  Duration: Steroids will stay in the system x 2 weeks. Duration of benefits will depend on multiple posibilities including persistent irritating factors.  From procedure: Some discomfort is to be expected once the numbing medicine wears off. This should be minimal if ice and heat are applied as instructed. Call if:  You experience numbness and weakness that gets worse with time, as opposed to wearing off.  New onset bowel or bladder incontinence. (Spinal procedures only)  Emergency Numbers:  Durning business hours (Monday - Thursday, 8:00 AM - 4:00 PM) (Friday, 9:00 AM - 12:00 Noon): [phone]  After hours: [phone] ____________________________________________________________________________________________  Pain Management Discharge Instructions  General Discharge Instructions :  If you need to reach your doctor call: Monday-Friday 8:00 am - 4:00 pm at [phone] or toll free [phone].  After clinic hours [phone] to have operator reach doctor.  Bring all of your medication bottles to all your appointments in the pain clinic.  To cancel or reschedule your appointment with Pain Management please remember to call 24 hours in advance to avoid a fee.  Refer to the educational materials which you have been given on: General Risks, I had my Procedure. Discharge Instructions, Post Sedation.  Post Procedure Instructions:  The drugs you   were given will stay in your system until tomorrow, so for the next 24 hours you should not drive, make any legal decisions or drink any alcoholic  beverages.  You may eat anything you prefer, but it is better to start with liquids then soups and crackers, and gradually work up to solid foods.  Please notify your doctor immediately if you have any unusual bleeding, trouble breathing or pain that is not related to your normal pain.  Depending on the type of procedure that was done, some parts of your body may feel week and/or numb.  This usually clears up by tonight or the next day.  Walk with the use of an assistive device or accompanied by an adult for the 24 hours.  You may use ice on the affected area for the first 24 hours.  Put ice in a Ziploc bag and cover with a towel and place against area 15 minutes on 15 minutes off.  You may switch to heat after 24 hours. Facet Joint Block The facet joints connect the bones of the spine (vertebrae). They make it possible for you to bend, twist, and make other movements with your spine. They also keep you from bending too far, twisting too far, and making other excessive movements. A facet joint block is a procedure where a numbing medicine (anesthetic) is injected into a facet joint. Often, a type of anti-inflammatory medicine called a steroid is also injected. A facet joint block may be done to diagnose neck or back pain. If the pain gets better after a facet joint block, it means the pain is probably coming from the facet joint. If the pain does not get better, it means the pain is probably not coming from the facet joint. A facet joint block may also be done to relieve neck or back pain caused by an inflamed facet joint. A facet joint block is only done to relieve pain if the pain does not improve with other methods, such as medicine, exercise programs, and physical therapy. Tell a health care provider about:  Any allergies you have.  All medicines you are taking, including vitamins, herbs, eye drops, creams, and over-the-counter medicines.  Any problems you or family members have had with  anesthetic medicines.  Any blood disorders you have.  Any surgeries you have had.  Any medical conditions you have.  Whether you are pregnant or may be pregnant. What are the risks? Generally, this is a safe procedure. However, problems may occur, including:  Bleeding.  Injury to a nerve near the injection site.  Pain at the injection site.  Weakness or numbness in areas controlled by nerves near the injection site.  Infection.  Temporary fluid retention.  Allergic reactions to medicines or dyes.  Injury to other structures or organs near the injection site.  What happens before the procedure?  Follow instructions from your health care provider about eating or drinking restrictions.  Ask your health care provider about: ? Changing or stopping your regular medicines. This is especially important if you are taking diabetes medicines or blood thinners. ? Taking medicines such as aspirin and ibuprofen. These medicines can thin your blood. Do not take these medicines before your procedure if your health care provider instructs you not to.  Do not take any new dietary supplements or medicines without asking your health care provider first.  Plan to have someone take you home after the procedure. What happens during the procedure?  You may need to remove your   clothing and dress in an open-back gown.  The procedure will be done while you are lying on an X-ray table. You will most likely be asked to lie on your stomach, but you may be asked to lie in a different position if an injection will be made in your neck.  Machines will be used to monitor your oxygen levels, heart rate, and blood pressure.  If an injection will be made in your neck, an IV tube will be inserted into one of your veins. Fluids and medicine will flow directly into your body through the IV tube.  The area over the facet joint where the injection will be made will be cleaned with soap. The surrounding skin  will be covered with clean drapes.  A numbing medicine (local anesthetic) will be applied to your skin. Your skin may sting or burn for a moment.  A video X-ray machine (fluoroscopy) will be used to locate the joint. In some cases, a CT scan may be used.  A contrast dye may be injected into the facet joint area to help locate the joint.  When the joint is located, an anesthetic will be injected into the joint through the needle.  Your health care provider will ask you whether you feel pain relief. If you do feel relief, a steroid may be injected to provide pain relief for a longer period of time. If you do not feel relief or feel only partial relief, additional injections of an anesthetic may be made in other facet joints.  The needle will be removed.  Your skin will be cleaned.  A bandage (dressing) will be applied over each injection site. The procedure may vary among health care providers and hospitals. What happens after the procedure?  You will be observed for 15-30 minutes before being allowed to go home. This information is not intended to replace advice given to you by your health care provider. Make sure you discuss any questions you have with your health care provider. Document Released: 04/29/2007 Document Revised: 01/09/2016 Document Reviewed: 09/03/2015 Elsevier Interactive Patient Education  2018 [place]  Facet Joint Block, Care After Refer to this sheet in the next few weeks. These instructions provide you with information about caring for yourself after your procedure. Your health care provider may also give you more specific instructions. Your treatment has been planned according to current medical practices, but problems sometimes occur. Call your health care provider if you have any problems or questions after your procedure. What can I expect after the procedure? After the procedure, it is common to have: Some tenderness over the injection sites for 2 days  after the procedure. A temporary increase in blood sugar if you have diabetes.  Follow these instructions at home: Keep track of the amount of pain relief you feel and how long it lasts. Take over-the-counter and prescription medicines only as told by your health care provider. You may need to limit pain medicine within the first 4-6 hours after the procedure. Remove your bandages (dressings) the morning after the procedure. For the first 24 hours after the procedure: Do not apply heat near or over the injection sites. Do not take a bath or soak in water, such as in a pool or lake. Do not drive or operate heavy machinery unless approved by your health care provider. Avoid activities that require a lot of energy. If the injection site is tender, try applying ice to the area. To do this: Put ice in a plastic  bag. Place a towel between your skin and the bag. Leave the ice on for 20 minutes, 2-3 times a day. Keep all follow-up visits as told by your health care provider. This is important. Contact a health care provider if: Fluid is coming from an injection site. There is significant bleeding or swelling at an injection site. You have diabetes and your blood sugar is above 180 mg/dL. Get help right away if: You have a fever. You have worsening pain or swelling around an injection site. There are red streaks around an injection site. You develop severe pain that is not controlled by your medicines. You develop a headache, stiff neck, nausea, or vomiting. Your eyes become very sensitive to light. You have weakness, paralysis, or tingling in your arms or legs that was not present before the procedure. You have difficulty urinating or breathing. This information is not intended to replace advice given to you by your health care provider. Make sure you discuss any questions you have with your health care provider. Document Released: 11/24/2012 Document Revised: 04/23/2016 Document Reviewed:  09/03/2015 Elsevier Interactive Patient Education  2018 [place]. Sacroiliac (SI) Joint Injection Patient Information  Description: The sacroiliac joint connects the scrum (very low back and tailbone) to the ilium (a pelvic bone which also forms half of the hip joint).  Normally this joint experiences very little motion.  When this joint becomes inflamed or unstable low back and or hip and pelvis pain may result.  Injection of this joint with local anesthetics (numbing medicines) and steroids can provide diagnostic information and reduce pain.  This injection is performed with the aid of x-ray guidance into the tailbone area while you are lying on your stomach.   You may experience an electrical sensation down the leg while this is being done.  You may also experience numbness.  We also may ask if we are reproducing your normal pain during the injection.  Conditions which may be treated SI injection:   Low back, buttock, hip or leg pain  Preparation for the Injection:  1. Do not eat any solid food or dairy products within 8 hours of your appointment.  2. You may drink clear liquids up to 3 hours before appointment.  Clear liquids include water, black coffee, juice or soda.  No milk or cream please. 3. You may take your regular medications, including pain medications with a sip of water before your appointment.  Diabetics should hold regular insulin (if take separately) and take 1/2 normal NPH dose the morning of the procedure.  Carry some sugar containing items with you to your appointment. 4. A driver must accompany you and be prepared to drive you home after your procedure. 5. Bring all of your current medications with you. 6. An IV may be inserted and sedation may be given at the discretion of the physician. 7. A blood pressure cuff, EKG and other monitors will often be applied during the procedure.  Some patients may need to have extra oxygen administered for a short period.  8. You  will be asked to provide medical information, including your allergies, prior to the procedure.  We must know immediately if you are taking blood thinners (like Coumadin/Warfarin) or if you are allergic to IV iodine contrast (dye).  We must know if you could possible be pregnant.  Possible side effects:   Bleeding from needle site  Infection (rare, may require surgery)  Nerve injury (rare)  Numbness & tingling (temporary)  A brief convulsion  or seizure  Light-headedness (temporary)  Pain at injection site (several days)  Decreased blood pressure (temporary)  Weakness in the leg (temporary)   Call if you experience:   New onset weakness or numbness of an extremity below the injection site that last more than 8 hours.  Hives or difficulty breathing ( go to the emergency room)  Inflammation or drainage at the injection site  Any new symptoms which are concerning to you  Please note:  Although the local anesthetic injected can often make your back/ hip/ buttock/ leg feel good for several hours after the injections, the pain will likely return.  It takes 3-7 days for steroids to work in the sacroiliac area.  You may not notice any pain relief for at least that one week.  If effective, we will often do a series of three injections spaced 3-6 weeks apart to maximally decrease your pain.  After the initial series, we generally will wait some months before a repeat injection of the same type.  If you have any questions, please call [phone] [facility]

## 2017-08-12 ENCOUNTER — Telehealth: Payer: Self-pay | Admitting: *Deleted

## 2017-08-12 NOTE — Telephone Encounter (Signed)
Attempted to call for post procedure follow-up. Message left. 

## 2017-08-13 ENCOUNTER — Encounter: Payer: Self-pay | Admitting: Physical Therapy

## 2017-08-13 ENCOUNTER — Ambulatory Visit: Payer: Medicare Other | Admitting: Physical Therapy

## 2017-08-13 DIAGNOSIS — M6281 Muscle weakness (generalized): Secondary | ICD-10-CM

## 2017-08-13 DIAGNOSIS — R2689 Other abnormalities of gait and mobility: Secondary | ICD-10-CM

## 2017-08-13 DIAGNOSIS — R278 Other lack of coordination: Secondary | ICD-10-CM

## 2017-08-13 NOTE — Therapy (Signed)
[place] MAIN [facility] SERVICES [address] [place], [place], [address] Phone: [phone]   Fax:  [phone]  Physical Therapy Treatment  Patient Details  Name: ISBELLA ARLINE MRN: [id number] Date of Birth: 04/28/1946 Referring Provider: Dr. Joanna Hews   Encounter Date: 08/13/2017      PT End of Session - 08/13/17 1750    Visit Number 3   Number of Visits 17   Date for PT Re-Evaluation 09/29/17   Authorization Type G-Code 3   PT Start Time 1300   PT Stop Time 1345   PT Time Calculation (min) 45 min   Equipment Utilized During Treatment Gait belt   Activity Tolerance Patient tolerated treatment well   Behavior During Therapy [facility] for tasks assessed/performed      Past Medical History:  Diagnosis Date  . Anemia   . Bipolar affective disorder ([place])    2  . Broken foot 2015   right foot  . Chronic gastritis   . Chronic kidney disease    Dr Holley Raring  . COPD (chronic obstructive pulmonary disease) ([place])   . Dizziness    inner ear (per pt) several times per week  . Dysphagia   . Glaucoma   . Hyperlipemia   . Hypertension    pt has recently come off BP meds.  MD aware. BP seems stable.  . Lithium toxicity 2015  . 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 Neck stiffness    s/p C3-C4 fusion, limited right turn  . Osteoarthritis    back  . Pancreatitis   . Personal history of malignant neoplasm of breast   . Pituitary microadenoma ([place])   . Recurrent UTI   . Renal insufficiency   . Stomach ulcer 2112    Past Surgical History:  Procedure Laterality Date  . APPENDECTOMY    . BREAST BIOPSY Right 2011   neg  . BREAST EXCISIONAL BIOPSY Right 2001   neg  . BREAST EXCISIONAL BIOPSY Right 2008   breast ca 2008 radiation  . BREAST EXCISIONAL BIOPSY Left 10/26/2012   neg  . BREAST SURGERY Left  2013   left breast wide excision,Intraductal papilloma,  ductal hyperplasia and sclerosing adenosis. Microcalcifications associated with columnar cell change. No evidence of atypia or malignancy. Margins are unremarkable.  Marland Kitchen BREAST SURGERY Right November 06, 1999   multiple areas of microcalcification showing evidence of sclerosing adenosis and ductal hyperplasia.  Marland Kitchen BREAST SURGERY Left May 07, 2007   wide excision.  . cataract Right 2013  . CATARACT EXTRACTION W/PHACO Left 03/05/2016   Procedure: CATARACT EXTRACTION PHACO AND INTRAOCULAR LENS PLACEMENT (IOC);  Surgeon: Leandrew Koyanagi, MD;  Location: [place];  Service: Ophthalmology;  Laterality: Left;  . COLONOSCOPY  2004  . COLONOSCOPY N/A 05/14/2015   Procedure: COLONOSCOPY;  Surgeon: Manya Silvas, MD;  Location: [facility] ENDOSCOPY;  Service: Endoscopy;  Laterality: N/A;  . ERCP N/A   . ESOPHAGOGASTRODUODENOSCOPY N/A 05/14/2015   Procedure: ESOPHAGOGASTRODUODENOSCOPY (EGD);  Surgeon: Manya Silvas, MD;  Location: [facility] ENDOSCOPY;  Service: Endoscopy;  Laterality: N/A;  . EUS N/A 2011, 2012  . EYE SURGERY  2013  . GLAUCOMA SURGERY Right 2013  . LAPAROSCOPIC RIGHT COLECTOMY Right 06/22/2015   Procedure: LAPAROSCOPIC RIGHT COLECTOMY;  Surgeon: Robert Bellow, MD;  Location: [facility] ORS;  Service: General;  Laterality: Right;  . NECK SURGERY  2006  . right breast  cancer    . SAVORY DILATION N/A 05/14/2015   Procedure: SAVORY DILATION;  Surgeon: Manya Silvas, MD;  Location[facility] ENDOSCOPY;  Service: Endoscopy;  Laterality: N/A;  . TRABECULECTOMY Left 03/05/2016   Procedure: TRABECULECTOMY WITH [facility] AND [facility]T;  Surgeon: Leandrew Koyanagi, MD;  Location: [place];  Service: Ophthalmology;  Laterality: Left;  . TUBAL LIGATION      There were no vitals filed for this visit.      Subjective Assessment - 08/13/17 1259    Subjective Pt is in good spirits, she reports she is doing well. When asked if she has has any falls she at first reported no, but later  reported that she has about 4 falls per week, including this past week. Pt reports no injuries from falls and reports no pain at this time.   Pertinent History 71 yo Female with recent increase in back pain with subsequent numbness/tingling , weakness, and drop foot in B LEs, L>R starting about 3 months ago. She has had increased instability in gait and falls recently with minor injuries. Pt had recent epidural steroid injection L4-5, and L5-S1 with decrease in back pain since.Pt with extensive surgical and medical Hx including breast cancer (last surgery 2013), DJD with cervical fusion C3-5 in 2006, bipolar disorder, HTN, OA in bilateral shoulders L>R, hip and SIJ pain x >20 years.    Limitations Lifting;Standing;Walking;House hold activities   How long can you sit comfortably? Unlimited   How long can you stand comfortably? 20-30 minutes before balance gets worse   How long can you walk comfortably? 40' approx before drop foot or pain starts.    Diagnostic tests MRI 05/20/17: Chronic lumbar scoliosis with mild grade 1 spondylolisthesis from L3 L4 to L5-S1. Advanced disc and facet degeneration at those levels. Mild to moderate spinal and foraminal stenosis at L3-L4 and L4-L5. Right L5 radiculitis. Moderate neural foraminal stenosis also at the left L1 and both L2 nerve levels.   Patient Stated Goals To stop falling   Currently in Pain? No/denies   Pain Onset More than a month ago      Treatment:   Vision screened:  . visual acuity: impaired, pt reports seeing halos and fuzzy boarders, unable to identify a playing card at 6-8' . visual fields; impaired peripheral vision to 45 degrees superior and inferior in sagittal plane; less limited in lateral fields  . ocular motor:  Saccades: approx 100 deg/sec limited approx 25% of norm  Convergence break 3-4inch [facility])  Recovery of single vision 6-7inch (delayed)  Gait training:               Rollator with obstacles placed approx 3' apart through 3  cones and over half bolster x 3 laps; pt required mod cues with 2-3 demonstrations to navigate over half bolster safely. Pt showed poor judgement with proximity to Rollator. Pt able to weave through cones with good stability and no evidence of LOB.    X 160' on carpeted firm surface with cues to speed up and suddenly stop; pt had good stability and demonstrated safe and rapid application of Rollator breaks for emergency stop with greatly improved stability compared to without Rollator.    Neuro Re-education    Standing in parallel bars:   Stance progression on solid surface including neutral stance, Romberg, semi-tandem, and tandem stance x 30 sec each, eyes opened/eyes closed without UE support. Pt had increased sway in semi-tandem and tandem and repeated LOB with eyes closed in  these stances.     Progression repeated on 2" Airex foam with increased sway in all stances. Repeated LOB in tandem/semi-tandem eyes closed in posterior direction. Pt has heavy reliance on vision for balance.     Inclined foam ramp x 1 min in neutral and wide stance, pt had inability to achieve stance within BOS due to DF weakness and relied on heavy UE support to prevent posterior LOB    Declined ramp 5 x 30 sec with neutral and wide stance and cues for head turns; pt initailly with multiple posterior and lateral LOB with lateral head turns, which improved rapidly with pt maintaining balance x >30 sec. Repeated with vertical head turns with similar result        PT Education - 08/13/17 1749    Education provided Yes   Education Details Gait training, balance training, vision screen   Person(s) Educated Patient   Methods Explanation;Demonstration;Tactile cues;Verbal cues   Comprehension Verbal cues required;Tactile cues required;Returned demonstration;Verbalized understanding          PT Short Term Goals - 08/05/17 0839      PT SHORT TERM GOAL #1   Title Patient will deny any falls over past 4 weeks to  demonstrate improved safety awareness at home and work.    Baseline Fall 2 days ago   Time 4   Period Weeks   Status New   Target Date 09/02/17     PT SHORT TERM GOAL #2   Title Pt will obtain and appropriately use LRAD (Rollator suggested) in order to decraese fall risk and increase community access.    Baseline Pt does not use AD   Time 2   Period Weeks   Status New   Target Date 08/19/17     PT SHORT TERM GOAL #3   Title --           PT Long Term Goals - 08/04/17 1730      PT LONG TERM GOAL #1   Title Patient will be independent in home exercise program to improve strength/mobility for better functional independence with ADLs.   Baseline HEP not initiated   Time 8   Period Weeks   Status New   Target Date 09/29/17     PT LONG TERM GOAL #2   Title Patient will increase Berg Balance score by > 6 points to demonstrate decreased fall risk during functional activities.   Baseline Will assess next visit   Time 8   Period Weeks   Status New   Target Date 09/29/17     PT LONG TERM GOAL #3   Title Patient (> 26 years old) will complete five times sit to stand test in < 15 seconds indicating an increased LE strength and improved balance.   Baseline 22 sec indicating pt is high fall risk for her age   Time 61   Period Weeks   Status New   Target Date 09/30/17     PT LONG TERM GOAL #4   Title Patient will ascend/descend 4 stairs without rail assist independently without loss of balance to improve ability to get in/out of home.    Baseline Unsafe on stairs   Time 8   Period Weeks   Status New   Target Date 09/29/17     PT LONG TERM GOAL #5   Title Patient will increase BLE gross strength to 4/5 as to improve functional strength for independent gait, increased standing tolerance and increased ADL ability.   Baseline  2- to 4-/5 grossly   Time 8   Period Weeks   Status New   Target Date 09/29/17     Additional Long Term Goals   Additional Long Term Goals Yes      PT LONG TERM GOAL #6   Title --   Baseline --   Time --   Period --   Status --   Target Date --               Plan - 08/13/17 1815    Clinical Impression Statement Pt led in gait training with Rollator with cues for fast walking and sudden stops. Pt has much improved stability with Rollator and is capable of using the breaks appropriately to stop rapidly without LOB. Pt instructed in balance training with multiple LOB primarily with eyes closed and in tandem stances. Vision screen revealed that pt has limitations in visual acuity, visual field, and in saccadic frequency. Pt will benefit from continued physical therapy to maximize her functional mobility and her safety.   Rehab Potential Fair   Clinical Impairments Affecting Rehab Potential Pt does not use AD and had decreased knowledge of her limitations   PT Frequency 2x / week   PT Duration 8 weeks   PT Treatment/Interventions ADLs/Self Care Home Management;Aquatic Therapy;Biofeedback;Canalith Repostioning;Cryotherapy;Moist Heat;Cognitive remediation;Neuromuscular re-education;Balance training;Therapeutic exercise;Therapeutic activities;Functional mobility training;Stair training;Gait training;DME Instruction;Patient/family education;Orthotic Fit/Training;Manual techniques;Passive range of motion;Visual/perceptual remediation/compensation;Vestibular;Energy conservation;Dry needling   PT Next Visit Plan work on balance and strengthening;    PT Home Exercise Plan Research Rollator   Consulted and Agree with Plan of Care Patient      Patient will benefit from skilled therapeutic intervention in order to improve the following deficits and impairments:  Abnormal gait, Decreased activity tolerance, Decreased balance, Decreased cognition, Decreased coordination, Decreased safety awareness, Decreased mobility, Decreased knowledge of use of DME, Decreased knowledge of precautions, Decreased endurance, Decreased skin integrity, Decreased  strength, Difficulty walking, Dizziness, Postural dysfunction, Improper body mechanics, Impaired vision/preception, Impaired UE functional use, Impaired sensation, Hypomobility, Decreased range of motion, Impaired flexibility, Pain  Visit Diagnosis: Muscle weakness (generalized)  Other lack of coordination  Other abnormalities of gait and mobility     Problem List Patient Active Problem List   Diagnosis Date Noted  . Lumbar facet joint syndrome (Primary Area of Pain) (Bilateral) (L>R) 07/29/2017  . Chronic sacroiliac joint pain (Bilateral) (L>R) 07/29/2017  . Chronic hip pain (Bilateral) (L>R) 07/29/2017  . Chronic lower extremity pain (Secondary Area of Pain) (Bilateral) (L>R) 07/29/2017  . Impairment of balance 07/07/2017  . Menopausal and female climacteric states 06/15/2017  . Lumbar foraminal stenosis (T12-S1, multilevel) 06/10/2017  . Lumbar lateral recess stenosis (left L2-3, bilateral L3-4, & right L4-5) 06/10/2017  . Grade 1 Anterolisthesis of L4 over L5 and L5 over S1 06/10/2017  . Lumbar facet hypertrophy (multilevel) 06/10/2017  . Lumbar Ligamentum flavum hypertrophy (HCC) (multilevel) 06/10/2017  . Dropfoot (Left) (L5 radiculopathy) 06/10/2017  . Neurogenic pain 06/09/2017  . DDD (degenerative disc disease), lumbar 06/09/2017  . 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 Closed fracture of metatarsal bone 05/06/2017  . 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 Abnormal MRI, cervical spine 05/06/2017  . 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[place]) 05/06/2017  . Cervical radiculitis 05/06/2017  . Chronic lumbar radiculopathy (Bilateral) (L>R) (left L5) 05/06/2017  . Chronic low back pain (Primary Area of Pain) (Bilateral) (L>R) 05/06/2017  . Abnormal MRI, lumbar spine  05/06/2017  . Chronic pain syndrome 03/19/2017  . Chronic shoulder pain (Fourth Area of Pain)  (Bilateral) (L>R) 03/19/2017  . Osteoarthritis of shoulder (Bilateral) 03/19/2017  . Chronic neck pain [facility] Area of Pain) (Bilateral) (L>R) 03/19/2017  . History of C3-5 ACDF 03/19/2017  . Numbness and tingling in left upper extremity 03/19/2017  . Numbness and tingling of right upper extremity 03/19/2017  . Upper extremity weakness (Bilateral) (L>R) 03/19/2017  . Numbness of upper extremity (Bilateral) (L>R) 03/19/2017  . Numbness and tingling of lower extremity (Bilateral) (L>R) 03/19/2017  . Weakness of lower extremity (Bilateral) (R>L) 03/19/2017  . Chronic abdominal pain (epigastric) 03/19/2017  . Cervical syndrome 03/19/2017  . Cervical spine disease 12/08/2016  . Pituitary microadenoma ([place]) 12/08/2016  . History of breast cancer in adulthood 12/08/2016  . Dysmetria 12/08/2016  . Loss of weight 08/15/2015  . Colon polyp 05/25/2015  . Personal history of malignant neoplasm of breast 07/18/2014  . Breast cancer ([place]) 06/28/2014  . Bipolar disorder ([place]) 06/28/2014  . Chronic pancreatitis ([place]) 06/28/2014  . HTN (hypertension) 06/28/2014  . OA (osteoarthritis) 06/28/2014  . Renal insufficiency 06/28/2014  . DDD (degenerative disc disease), cervical 06/28/2014  . COPD (chronic obstructive pulmonary disease) ([place]) 05/17/2014  . Breast cancer of upper-outer quadrant of right female breast ([place]) 05/31/2008   Lynley Killilea Lenis Dickinson, SPT This entire session was performed under direct supervision and direction of a licensed therapist/therapist assistant . I have personally read, edited and approve of the note as written. Hillis Range, PT, DPT, [phone] 08/17/17 9:53 AM   [place] MAIN [facility] SERVICES [address] [place], [place], [address] Phone: [phone]   Fax:  [phone]  Name: FIORA WEILL MRN: [id number] Date of Birth: 16-Jun-1946

## 2017-08-18 ENCOUNTER — Ambulatory Visit: Payer: Medicare Other | Admitting: Physical Therapy

## 2017-08-18 ENCOUNTER — Encounter: Payer: Self-pay | Admitting: Physical Therapy

## 2017-08-18 DIAGNOSIS — R278 Other lack of coordination: Secondary | ICD-10-CM

## 2017-08-18 DIAGNOSIS — M6281 Muscle weakness (generalized): Secondary | ICD-10-CM | POA: Diagnosis not present

## 2017-08-18 DIAGNOSIS — R2689 Other abnormalities of gait and mobility: Secondary | ICD-10-CM

## 2017-08-18 NOTE — Therapy (Signed)
[place] MAIN [facility] SERVICES [address] [place], [place], [address] Phone: [phone]   Fax:  [phone]  Physical Therapy Treatment  Patient Details  Name: Elaine Clayton MRN: [id number] Date of Birth: Apr 22, 1946 Referring Provider: Dr. Joanna Hews   Encounter Date: 08/18/2017      PT End of Session - 08/18/17 1313    Visit Number 4   Number of Visits 17   Date for PT Re-Evaluation 09/29/17   Authorization Type G-Code 4   PT Start Time 1302   PT Stop Time 1345   PT Time Calculation (min) 43 min   Equipment Utilized During Treatment Gait belt   Activity Tolerance Patient tolerated treatment well   Behavior During Therapy [facility] for tasks assessed/performed      Past Medical History:  Diagnosis Date  . Anemia   . Bipolar affective disorder ([place])    2  . Broken foot 2015   right foot  . Chronic gastritis   . Chronic kidney disease    Dr Holley Raring  . COPD (chronic obstructive pulmonary disease) ([place])   . Dizziness    inner ear (per pt) several times per week  . Dysphagia   . Glaucoma   . Hyperlipemia   . Hypertension    pt has recently come off BP meds.  MD aware. BP seems stable.  . Lithium toxicity 2015  . 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 Neck stiffness    s/p C3-C4 fusion, limited right turn  . Osteoarthritis    back  . Pancreatitis   . Personal history of malignant neoplasm of breast   . Pituitary microadenoma ([place])   . Recurrent UTI   . Renal insufficiency   . Stomach ulcer 2112    Past Surgical History:  Procedure Laterality Date  . APPENDECTOMY    . BREAST BIOPSY Right 2011   neg  . BREAST EXCISIONAL BIOPSY Right 2001   neg  . BREAST EXCISIONAL BIOPSY Right 2008   breast ca 2008 radiation  . BREAST EXCISIONAL BIOPSY Left 10/26/2012   neg  . BREAST SURGERY Left  2013   left breast wide excision,Intraductal papilloma,  ductal hyperplasia and sclerosing adenosis. Microcalcifications associated with columnar cell change. No evidence of atypia or malignancy. Margins are unremarkable.  Marland Kitchen BREAST SURGERY Right November 06, 1999   multiple areas of microcalcification showing evidence of sclerosing adenosis and ductal hyperplasia.  Marland Kitchen BREAST SURGERY Left May 07, 2007   wide excision.  . cataract Right 2013  . CATARACT EXTRACTION W/PHACO Left 03/05/2016   Procedure: CATARACT EXTRACTION PHACO AND INTRAOCULAR LENS PLACEMENT (IOC);  Surgeon: Leandrew Koyanagi, MD;  Location: [place];  Service: Ophthalmology;  Laterality: Left;  . COLONOSCOPY  2004  . COLONOSCOPY N/A 05/14/2015   Procedure: COLONOSCOPY;  Surgeon: Manya Silvas, MD;  Location: [facility] ENDOSCOPY;  Service: Endoscopy;  Laterality: N/A;  . ERCP N/A   . ESOPHAGOGASTRODUODENOSCOPY N/A 05/14/2015   Procedure: ESOPHAGOGASTRODUODENOSCOPY (EGD);  Surgeon: Manya Silvas, MD;  Location: [facility] ENDOSCOPY;  Service: Endoscopy;  Laterality: N/A;  . EUS N/A 2011, 2012  . EYE SURGERY  2013  . GLAUCOMA SURGERY Right 2013  . LAPAROSCOPIC RIGHT COLECTOMY Right 06/22/2015   Procedure: LAPAROSCOPIC RIGHT COLECTOMY;  Surgeon: Robert Bellow, MD;  Location: [facility] ORS;  Service: General;  Laterality: Right;  . NECK SURGERY  2006  . right breast  cancer    . SAVORY DILATION N/A 05/14/2015   Procedure: SAVORY DILATION;  Surgeon: Manya Silvas, MD;  Location: [facility] ENDOSCOPY;  Service: Endoscopy;  Laterality: N/A;  . TRABECULECTOMY Left 03/05/2016   Procedure: TRABECULECTOMY WITH [employer] AND [employer]T;  Surgeon: Leandrew Koyanagi, MD;  Location: [place];  Service: Ophthalmology;  Laterality: Left;  . TUBAL LIGATION      There were no vitals filed for this visit.      Subjective Assessment - 08/18/17 1311    Subjective Patient reports doing well; She reports still bumping into things but reports no severe loss of balance or real fall. She reports  compliance with HEP; She reports, "I bought a rollator yesterday and I have used it."    Pertinent History 71 yo Female with recent increase in back pain with subsequent numbness/tingling , weakness, and drop foot in B LEs, L>R starting about 3 months ago. She has had increased instability in gait and falls recently with minor injuries. Pt had recent epidural steroid injection L4-5, and L5-S1 with decrease in back pain since.Pt with extensive surgical and medical Hx including breast cancer (last surgery 2013), DJD with cervical fusion C3-5 in 2006, bipolar disorder, HTN, OA in bilateral shoulders L>R, hip and SIJ pain x >20 years.    Limitations Lifting;Standing;Walking;House hold activities   How long can you sit comfortably? Unlimited   How long can you stand comfortably? 20-30 minutes before balance gets worse   How long can you walk comfortably? 40' approx before drop foot or pain starts.    Diagnostic tests MRI 05/20/17: Chronic lumbar scoliosis with mild grade 1 spondylolisthesis from L3 L4 to L5-S1. Advanced disc and facet degeneration at those levels. Mild to moderate spinal and foraminal stenosis at L3-L4 and L4-L5. Right L5 radiculitis. Moderate neural foraminal stenosis also at the left L1 and both L2 nerve levels.   Patient Stated Goals To stop falling   Currently in Pain? No/denies   Pain Onset More than a month ago        TREATMENT: Warm up on Nustep BUE/BLE level 2 x5 min (unbilled);  PT advanced HEP with LE Strengthening and balance exercise: Standing with red tband around BLE: Hip abduction x10 bilaterally Hip extension x10 bilaterally; Side stepping x10 feet x2 laps each direction with cues to keep feet forward for better hip abductor strengthening; Patient required min-moderate verbal/tactile cues for correct exercise technique including to avoid forward lean for better hip control;   Instructed patient in balance exercise: Tandem stance with 1-0 rail assist 10 sec hold x2  each foot in front, cues for hand placement and to increase core stabilization for better balance control;  SLS on firm surface with 1-0  5-10 sec hold x2 each LE with close supervision; Forward/backward walking x10 feet x3 laps with cues for staying close to bar and avoiding veer side/side; Patient required min VCs for balance stability, including to increase trunk control for less loss of balance with smaller base of support   Sit<>Stand from chair with 1 HHA x5  x2 sets with cues to shift forward and increase push through LE for better transfer ability;    Leg press: BLE plate 90# 6P61 with min VCs to slow down eccentric return for better motor control and strength; Patient denies any discomfort during exercise;   Tolerated session well; Denies any pain at end of session;  PT Education - 08/18/17 1312    Education provided Yes   Education Details HEP advanced, balance/strengthening;    Person(s) Educated Patient   Methods Explanation;Demonstration;Verbal cues   Comprehension Verbalized understanding;Returned demonstration;Verbal cues required;Need further instruction          PT Short Term Goals - 08/05/17 0839      PT SHORT TERM GOAL #1   Title Patient will deny any falls over past 4 weeks to demonstrate improved safety awareness at home and work.    Baseline Fall 2 days ago   Time 4   Period Weeks   Status New   Target Date 09/02/17     PT SHORT TERM GOAL #2   Title Pt will obtain and appropriately use LRAD (Rollator suggested) in order to decraese fall risk and increase community access.    Baseline Pt does not use AD   Time 2   Period Weeks   Status New   Target Date 08/19/17     PT SHORT TERM GOAL #3   Title --           PT Long Term Goals - 08/04/17 1730      PT LONG TERM GOAL #1   Title Patient will be independent in home exercise program to improve strength/mobility for better functional independence with  ADLs.   Baseline HEP not initiated   Time 8   Period Weeks   Status New   Target Date 09/29/17     PT LONG TERM GOAL #2   Title Patient will increase Berg Balance score by > 6 points to demonstrate decreased fall risk during functional activities.   Baseline Will assess next visit   Time 8   Period Weeks   Status New   Target Date 09/29/17     PT LONG TERM GOAL #3   Title Patient (> 78 years old) will complete five times sit to stand test in < 15 seconds indicating an increased LE strength and improved balance.   Baseline 22 sec indicating pt is high fall risk for her age   Time 51   Period Weeks   Status New   Target Date 09/30/17     PT LONG TERM GOAL #4   Title Patient will ascend/descend 4 stairs without rail assist independently without loss of balance to improve ability to get in/out of home.    Baseline Unsafe on stairs   Time 8   Period Weeks   Status New   Target Date 09/29/17     PT LONG TERM GOAL #5   Title Patient will increase BLE gross strength to 4/5 as to improve functional strength for independent gait, increased standing tolerance and increased ADL ability.   Baseline 2- to 4-/5 grossly   Time 8   Period Weeks   Status New   Target Date 09/29/17     Additional Long Term Goals   Additional Long Term Goals Yes     PT LONG TERM GOAL #6   Title --   Baseline --   Time --   Period --   Status --   Target Date --               Plan - 08/18/17 1338    Clinical Impression Statement Patient instructed in advanced HEP including standing tband exercise and balance exercise to improve overall mobility. She requires cues for positioning to improve strength and motor control. Patient also required instruction to utilize counter/sink for safety with  balance exercise. She was able to complete all exercise with supervision exhibiting good safety awareness. She would benefit from additional skilled PT intervention to improve strength, balance and gait  safety;    Rehab Potential Fair   Clinical Impairments Affecting Rehab Potential Pt does not use AD and had decreased knowledge of her limitations   PT Frequency 2x / week   PT Duration 8 weeks   PT Treatment/Interventions ADLs/Self Care Home Management;Aquatic Therapy;Biofeedback;Canalith Repostioning;Cryotherapy;Moist Heat;Cognitive remediation;Neuromuscular re-education;Balance training;Therapeutic exercise;Therapeutic activities;Functional mobility training;Stair training;Gait training;DME Instruction;Patient/family education;Orthotic Fit/Training;Manual techniques;Passive range of motion;Visual/perceptual remediation/compensation;Vestibular;Energy conservation;Dry needling   PT Next Visit Plan work on balance and strengthening;    PT Home Exercise Plan advanced- see patient instructions   Consulted and Agree with Plan of Care Patient      Patient will benefit from skilled therapeutic intervention in order to improve the following deficits and impairments:  Abnormal gait, Decreased activity tolerance, Decreased balance, Decreased cognition, Decreased coordination, Decreased safety awareness, Decreased mobility, Decreased knowledge of use of DME, Decreased knowledge of precautions, Decreased endurance, Decreased skin integrity, Decreased strength, Difficulty walking, Dizziness, Postural dysfunction, Improper body mechanics, Impaired vision/preception, Impaired UE functional use, Impaired sensation, Hypomobility, Decreased range of motion, Impaired flexibility, Pain  Visit Diagnosis: Muscle weakness (generalized)  Other lack of coordination  Other abnormalities of gait and mobility     Problem List Patient Active Problem List   Diagnosis Date Noted  . Lumbar facet joint syndrome (Primary Area of Pain) (Bilateral) (L>R) 07/29/2017  . Chronic sacroiliac joint pain (Bilateral) (L>R) 07/29/2017  . Chronic hip pain (Bilateral) (L>R) 07/29/2017  . Chronic lower extremity pain (Secondary  Area of Pain) (Bilateral) (L>R) 07/29/2017  . Impairment of balance 07/07/2017  . Menopausal and female climacteric states 06/15/2017  . Lumbar foraminal stenosis (T12-S1, multilevel) 06/10/2017  . Lumbar lateral recess stenosis (left L2-3, bilateral L3-4, & right L4-5) 06/10/2017  . Grade 1 Anterolisthesis of L4 over L5 and L5 over S1 06/10/2017  . Lumbar facet hypertrophy (multilevel) 06/10/2017  . Lumbar Ligamentum flavum hypertrophy (HCC) (multilevel) 06/10/2017  . Dropfoot (Left) (L5 radiculopathy) 06/10/2017  . Neurogenic pain 06/09/2017  . DDD (degenerative disc disease), lumbar 06/09/2017  . 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 Closed fracture of metatarsal bone 05/06/2017  . 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 Abnormal MRI, cervical spine 05/06/2017  . 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[place]) 05/06/2017  . Cervical radiculitis 05/06/2017  . Chronic lumbar radiculopathy (Bilateral) (L>R) (left L5) 05/06/2017  . Chronic low back pain (Primary Area of Pain) (Bilateral) (L>R) 05/06/2017  . Abnormal MRI, lumbar spine 05/06/2017  . Chronic pain syndrome 03/19/2017  . Chronic shoulder pain (Fourth Area of Pain) (Bilateral) (L>R) 03/19/2017  . Osteoarthritis of shoulder (Bilateral) 03/19/2017  . Chronic neck pain [facility] Area of Pain) (Bilateral) (L>R) 03/19/2017  . History of C3-5 ACDF 03/19/2017  . Numbness and tingling in left upper extremity 03/19/2017  . Numbness and tingling of right upper extremity 03/19/2017  . Upper extremity weakness (Bilateral) (L>R) 03/19/2017  . Numbness of upper extremity (Bilateral) (L>R) 03/19/2017  . Numbness and tingling of lower extremity (Bilateral) (L>R) 03/19/2017  . Weakness of lower extremity (Bilateral) (R>L) 03/19/2017  . Chronic abdominal pain (epigastric) 03/19/2017  . Cervical syndrome 03/19/2017  . Cervical spine disease  12/08/2016  . Pituitary microadenoma ([place]) 12/08/2016  . History of breast cancer in adulthood 12/08/2016  . Dysmetria 12/08/2016  . Loss  of weight 08/15/2015  . Colon polyp 05/25/2015  . Personal history of malignant neoplasm of breast 07/18/2014  . Breast cancer ([place]) 06/28/2014  . Bipolar disorder ([place]) 06/28/2014  . Chronic pancreatitis ([place]) 06/28/2014  . HTN (hypertension) 06/28/2014  . OA (osteoarthritis) 06/28/2014  . Renal insufficiency 06/28/2014  . DDD (degenerative disc disease), cervical 06/28/2014  . COPD (chronic obstructive pulmonary disease) ([place]) 05/17/2014  . Breast cancer of upper-outer quadrant of right female breast ([place]) 05/31/2008    Tasharra Nodine PT, DPT 08/18/2017, 1:51 PM  [place] MAIN [facility] SERVICES [place], [place], [address] Phone: [phone]   Fax:  [phone]  Name: Elaine Clayton MRN: [id number] Date of Birth: Aug 30, 1946

## 2017-08-18 NOTE — Patient Instructions (Signed)
Balance, Proprioception: Hip Abduction With Tubing   With tubing attached to both ankles, Standing holding onto counter, kick one leg out to side and then Return.  Repeat _10___ times  On each side.  Do ___2_ sessions per day.  http://cc.exer.us/20   Copyright  VHI. All rights reserved.   Balance, Proprioception: Hip Extension With Tubing   With tubing tied around both legs, holding onto kitchen counter, swing leg back. Return. Repeat _10___ times . Do __2__ sessions per day.  http://cc.exer.us/19    Band Walk: Side Stepping   Tie band around legs, Below knees around ankles.  Step _10__ feet to one side, then step back to start. Repeat _2-3__ feet per session. Note: Small towel between band and skin eases rubbing.  http://plyo.exer.[email or web address]   SIT TO STAND: No Device   Sit with feet shoulder-width apart, on floor.(Make sure that you are in a chair that won't move like a chair against a wall or couch etc) Lean chest forward, raise hips up from surface. Straighten hips and knees. Weight bear equally on left and right sides. USE 1 HAND TO PUSH ON THE ARM REST 5___ reps per set, _2__ sets per day, _5__ days per week Place left leg closer to sitting surface.  Copyright  VHI. All rights reserved.  Backward Walking   Walk backward, toes of each foot coming down first. Take long, even strides. Make sure you have a clear pathway with no obstructions when you do this. Stand beside counter and walk backward  And then walk forward doing opposite directions; repeat 5-10 laps 2x a day at least 5 days a week.  Copyright  VHI. All rights reserved.  Tandem Walking   Stand beside kitchen sink and place one foot in front of the other, lift your hand and try to hold position for 5-10 sec. Repeat with other foot in front; Repeat 3 reps with each foot in front 5 days a week.Balance: Unilateral   Attempt to balance on left leg, eyes open. Hold _5-10___ seconds.Start with holding onto  counter and if you get your balance you can try to let go of counter. Repeat _3__ times per set. Do __1__ sets per session. Do __1__ sessions per day. Keep eyes open:   http://orth.exer.us/29   Copyright  VHI. All rights reserved.

## 2017-08-20 ENCOUNTER — Encounter: Payer: Self-pay | Admitting: Physical Therapy

## 2017-08-20 ENCOUNTER — Ambulatory Visit: Payer: Medicare Other | Admitting: Physical Therapy

## 2017-08-20 DIAGNOSIS — R2689 Other abnormalities of gait and mobility: Secondary | ICD-10-CM

## 2017-08-20 DIAGNOSIS — R278 Other lack of coordination: Secondary | ICD-10-CM

## 2017-08-20 DIAGNOSIS — M6281 Muscle weakness (generalized): Secondary | ICD-10-CM

## 2017-08-20 NOTE — Therapy (Signed)
[place] MAIN [facility] SERVICES [address] [place], [place], [address] Phone: [phone]   Fax:  [phone]  Physical Therapy Treatment  Patient Details  Name: Elaine Clayton MRN: [id number] Date of Birth: July 06, 1946 Referring Provider: Dr. Joanna Hews   Encounter Date: 08/20/2017      PT End of Session - 08/20/17 1204    Visit Number 5   Number of Visits 17   Date for PT Re-Evaluation 09/29/17   Authorization Type G-Code 5   PT Start Time 1128   PT Stop Time 1200   PT Time Calculation (min) 32 min   Equipment Utilized During Treatment Gait belt   Activity Tolerance Patient tolerated treatment well   Behavior During Therapy [facility] for tasks assessed/performed      Past Medical History:  Diagnosis Date  . Anemia   . Bipolar affective disorder ([place])    2  . Broken foot 2015   right foot  . Chronic gastritis   . Chronic kidney disease    Dr Holley Raring  . COPD (chronic obstructive pulmonary disease) ([place])   . Dizziness    inner ear (per pt) several times per week  . Dysphagia   . Glaucoma   . Hyperlipemia   . Hypertension    pt has recently come off BP meds.  MD aware. BP seems stable.  . Lithium toxicity 2015  . 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 Neck stiffness    s/p C3-C4 fusion, limited right turn  . Osteoarthritis    back  . Pancreatitis   . Personal history of malignant neoplasm of breast   . Pituitary microadenoma ([place])   . Recurrent UTI   . Renal insufficiency   . Stomach ulcer 2112    Past Surgical History:  Procedure Laterality Date  . APPENDECTOMY    . BREAST BIOPSY Right 2011   neg  . BREAST EXCISIONAL BIOPSY Right 2001   neg  . BREAST EXCISIONAL BIOPSY Right 2008   breast ca 2008 radiation  . BREAST EXCISIONAL BIOPSY Left 10/26/2012   neg  . BREAST SURGERY Left  2013   left breast wide excision,Intraductal papilloma,  ductal hyperplasia and sclerosing adenosis. Microcalcifications associated with columnar cell change. No evidence of atypia or malignancy. Margins are unremarkable.  Marland Kitchen BREAST SURGERY Right November 06, 1999   multiple areas of microcalcification showing evidence of sclerosing adenosis and ductal hyperplasia.  Marland Kitchen BREAST SURGERY Left May 07, 2007   wide excision.  . cataract Right 2013  . CATARACT EXTRACTION W/PHACO Left 03/05/2016   Procedure: CATARACT EXTRACTION PHACO AND INTRAOCULAR LENS PLACEMENT (IOC);  Surgeon: Leandrew Koyanagi, MD;  Location: [place];  Service: Ophthalmology;  Laterality: Left;  . COLONOSCOPY  2004  . COLONOSCOPY N/A 05/14/2015   Procedure: COLONOSCOPY;  Surgeon: Manya Silvas, MD;  Location: [facility] ENDOSCOPY;  Service: Endoscopy;  Laterality: N/A;  . ERCP N/A   . ESOPHAGOGASTRODUODENOSCOPY N/A 05/14/2015   Procedure: ESOPHAGOGASTRODUODENOSCOPY (EGD);  Surgeon: Manya Silvas, MD;  Location: [facility] ENDOSCOPY;  Service: Endoscopy;  Laterality: N/A;  . EUS N/A 2011, 2012  . EYE SURGERY  2013  . GLAUCOMA SURGERY Right 2013  . LAPAROSCOPIC RIGHT COLECTOMY Right 06/22/2015   Procedure: LAPAROSCOPIC RIGHT COLECTOMY;  Surgeon: Robert Bellow, MD;  Location: [facility] ORS;  Service: General;  Laterality: Right;  . NECK SURGERY  2006  . right breast  cancer    . SAVORY DILATION N/A 05/14/2015   Procedure: SAVORY DILATION;  Surgeon: Manya Silvas, MD;  Location: [facility] ENDOSCOPY;  Service: Endoscopy;  Laterality: N/A;  . TRABECULECTOMY Left 03/05/2016   Procedure: TRABECULECTOMY WITH [facility] AND EXPRESS SHUNT;  Surgeon: Leandrew Koyanagi, MD;  Location: [place];  Service: Ophthalmology;  Laterality: Left;  . TUBAL LIGATION      There were no vitals filed for this visit.      Subjective Assessment - 08/20/17 1130    Subjective Patient reports that she hurt her back while exercising with 4/10 pain this morning and 2/10 currently; she reports this is a common  occurance that usually resolves on its own   Pertinent History 71 yo Female with recent increase in back pain with subsequent numbness/tingling , weakness, and drop foot in B LEs, L>R starting about 3 months ago. She has had increased instability in gait and falls recently with minor injuries. Pt had recent epidural steroid injection L4-5, and L5-S1 with decrease in back pain since.Pt with extensive surgical and medical Hx including breast cancer (last surgery 2013), DJD with cervical fusion C3-5 in 2006, bipolar disorder, HTN, OA in bilateral shoulders L>R, hip and SIJ pain x >20 years.    Limitations Lifting;Standing;Walking;House hold activities   How long can you sit comfortably? Unlimited   How long can you stand comfortably? 20-30 minutes before balance gets worse   How long can you walk comfortably? 40' approx before drop foot or pain starts.    Diagnostic tests MRI 05/20/17: Chronic lumbar scoliosis with mild grade 1 spondylolisthesis from L3 L4 to L5-S1. Advanced disc and facet degeneration at those levels. Mild to moderate spinal and foraminal stenosis at L3-L4 and L4-L5. Right L5 radiculitis. Moderate neural foraminal stenosis also at the left L1 and both L2 nerve levels.   Patient Stated Goals To stop falling   Pain Score 2    Pain Location Back   Pain Orientation Lower   Pain Descriptors / Indicators Aching   Pain Type Chronic pain   Pain Onset More than a month ago   Pain Frequency Occasional   Aggravating Factors  Prolonged activity   Pain Relieving Factors Tylenol, rest   Effect of Pain on Daily Activities Decreased activity      Treatment:    Neuro Re-education   Standing in parallel bars    Tandem walking on Airex beam x 4 laps with cues to use no UE support; pt had LOB x 3 the first lap, and fewer LOB with each lap after; pt able to regain her balance with use of UE support with CGA for safety. Cues to slow down and to tighten core for increased stability.   Stance  progression on firm surface including Romberg with medium weighted ball pass, semi-tandem with ball pass and head turns, and tandem stance with head turns. 2 x 30 sec each; pt cued visually and verbally for ball pass target; cues to increase trunk rotation with head turns for increased weight shift and challenge to balance.    Repeated while standing on 2" Airex foam for increased challenge to balance. Pt was unable to weight shift while in tandem stance on Airex beam without immediate LOB. In semi-tandem pt had better balance with RLE forward. Pt cued for proper foot placement and to stand with more erect posture.   Step ups on BOSU flat side down, pt required modA through UEs to maintain balance with deficits in ankle and  knee stability. Cues for slower step down with erect posture.   Static stance on BOSU flat side up; pt had posterior weight shift causing BOSU to tilt with feet placed in center. Pt was unable to correct this with cueing, or with UE support. Cues to put more pressure on toes and for forward reaching to facilitate improved balance.   Ankle rocking on half bolster for increased ankle proprioception for improved balance 2 x 10 in DF and in PF; pt able to rock with minimal UE assist, though she uses momentum and knee bend to control PF motion.        PT Education - 08/20/17 1204    Education provided Yes   Education Details Automotive engineer) Educated Patient   Methods Explanation;Demonstration;Tactile cues;Verbal cues   Comprehension Verbal cues required;Tactile cues required;Returned demonstration;Verbalized understanding          PT Short Term Goals - 08/05/17 0839      PT SHORT TERM GOAL #1   Title Patient will deny any falls over past 4 weeks to demonstrate improved safety awareness at home and work.    Baseline Fall 2 days ago   Time 4   Period Weeks   Status New   Target Date 09/02/17     PT SHORT TERM GOAL #2   Title Pt will obtain and  appropriately use LRAD (Rollator suggested) in order to decraese fall risk and increase community access.    Baseline Pt does not use AD   Time 2   Period Weeks   Status New   Target Date 08/19/17     PT SHORT TERM GOAL #3   Title --           PT Long Term Goals - 08/04/17 1730      PT LONG TERM GOAL #1   Title Patient will be independent in home exercise program to improve strength/mobility for better functional independence with ADLs.   Baseline HEP not initiated   Time 8   Period Weeks   Status New   Target Date 09/29/17     PT LONG TERM GOAL #2   Title Patient will increase Berg Balance score by > 6 points to demonstrate decreased fall risk during functional activities.   Baseline Will assess next visit   Time 8   Period Weeks   Status New   Target Date 09/29/17     PT LONG TERM GOAL #3   Title Patient (> 91 years old) will complete five times sit to stand test in < 15 seconds indicating an increased LE strength and improved balance.   Baseline 22 sec indicating pt is high fall risk for her age   Time 4   Period Weeks   Status New   Target Date 09/30/17     PT LONG TERM GOAL #4   Title Patient will ascend/descend 4 stairs without rail assist independently without loss of balance to improve ability to get in/out of home.    Baseline Unsafe on stairs   Time 8   Period Weeks   Status New   Target Date 09/29/17     PT LONG TERM GOAL #5   Title Patient will increase BLE gross strength to 4/5 as to improve functional strength for independent gait, increased standing tolerance and increased ADL ability.   Baseline 2- to 4-/5 grossly   Time 8   Period Weeks   Status New   Target Date 09/29/17  Additional Long Term Goals   Additional Long Term Goals Yes     PT LONG TERM GOAL #6   Title --   Baseline --   Time --   Period --   Status --   Target Date --               Plan - 08/20/17 1205    Clinical Impression Statement Pt presents to therapy  late. Pt instructed in balance training including tandem walking and stance with weight shifting and head turns. Pt showed some in session improvement with tandem walking. She has notable deficits in ankle control with a posterior weight bias seen on BOSU. Pt has been using her new Rollator at home to increase her walking tolerance and safety in her neightborhood. She is still not using an AD when ambulating indoors. Pt will benefit from continued therapy to maximize functional mobility and safety.   Rehab Potential Fair   Clinical Impairments Affecting Rehab Potential Pt does not use AD and had decreased knowledge of her limitations   PT Frequency 2x / week   PT Duration 8 weeks   PT Treatment/Interventions ADLs/Self Care Home Management;Aquatic Therapy;Biofeedback;Canalith Repostioning;Cryotherapy;Moist Heat;Cognitive remediation;Neuromuscular re-education;Balance training;Therapeutic exercise;Therapeutic activities;Functional mobility training;Stair training;Gait training;DME Instruction;Patient/family education;Orthotic Fit/Training;Manual techniques;Passive range of motion;Visual/perceptual remediation/compensation;Vestibular;Energy conservation;Dry needling   PT Next Visit Plan Ankle rocking on 1/2 bolster, BAPS board    PT Home Exercise Plan advanced- see patient instructions   Consulted and Agree with Plan of Care Patient      Patient will benefit from skilled therapeutic intervention in order to improve the following deficits and impairments:  Abnormal gait, Decreased activity tolerance, Decreased balance, Decreased cognition, Decreased coordination, Decreased safety awareness, Decreased mobility, Decreased knowledge of use of DME, Decreased knowledge of precautions, Decreased endurance, Decreased skin integrity, Decreased strength, Difficulty walking, Dizziness, Postural dysfunction, Improper body mechanics, Impaired vision/preception, Impaired UE functional use, Impaired sensation,  Hypomobility, Decreased range of motion, Impaired flexibility, Pain  Visit Diagnosis: Muscle weakness (generalized)  Other lack of coordination  Other abnormalities of gait and mobility     Problem List Patient Active Problem List   Diagnosis Date Noted  . Lumbar facet joint syndrome (Primary Area of Pain) (Bilateral) (L>R) 07/29/2017  . Chronic sacroiliac joint pain (Bilateral) (L>R) 07/29/2017  . Chronic hip pain (Bilateral) (L>R) 07/29/2017  . Chronic lower extremity pain (Secondary Area of Pain) (Bilateral) (L>R) 07/29/2017  . Impairment of balance 07/07/2017  . Menopausal and female climacteric states 06/15/2017  . Lumbar foraminal stenosis (T12-S1, multilevel) 06/10/2017  . Lumbar lateral recess stenosis (left L2-3, bilateral L3-4, & right L4-5) 06/10/2017  . Grade 1 Anterolisthesis of L4 over L5 and L5 over S1 06/10/2017  . Lumbar facet hypertrophy (multilevel) 06/10/2017  . Lumbar Ligamentum flavum hypertrophy (HCC) (multilevel) 06/10/2017  . Dropfoot (Left) (L5 radiculopathy) 06/10/2017  . Neurogenic pain 06/09/2017  . DDD (degenerative disc disease), lumbar 06/09/2017  . 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 Closed fracture of metatarsal bone 05/06/2017  . 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 Abnormal MRI, cervical spine 05/06/2017  . 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[place]) 05/06/2017  . Cervical radiculitis 05/06/2017  . Chronic lumbar radiculopathy (Bilateral) (L>R) (left L5) 05/06/2017  . Chronic low back pain (Primary Area of Pain) (Bilateral) (L>R) 05/06/2017  . Abnormal MRI, lumbar spine 05/06/2017  . Chronic pain syndrome 03/19/2017  . Chronic shoulder pain (Fourth Area of Pain) (Bilateral) (  L>R) 03/19/2017  . Osteoarthritis of shoulder (Bilateral) 03/19/2017  . Chronic neck pain [facility] Area of Pain) (Bilateral) (L>R) 03/19/2017  . History of  C3-5 ACDF 03/19/2017  . Numbness and tingling in left upper extremity 03/19/2017  . Numbness and tingling of right upper extremity 03/19/2017  . Upper extremity weakness (Bilateral) (L>R) 03/19/2017  . Numbness of upper extremity (Bilateral) (L>R) 03/19/2017  . Numbness and tingling of lower extremity (Bilateral) (L>R) 03/19/2017  . Weakness of lower extremity (Bilateral) (R>L) 03/19/2017  . Chronic abdominal pain (epigastric) 03/19/2017  . Cervical syndrome 03/19/2017  . Cervical spine disease 12/08/2016  . Pituitary microadenoma ([place]) 12/08/2016  . History of breast cancer in adulthood 12/08/2016  . Dysmetria 12/08/2016  . Loss of weight 08/15/2015  . Colon polyp 05/25/2015  . Personal history of malignant neoplasm of breast 07/18/2014  . Breast cancer ([place]) 06/28/2014  . Bipolar disorder ([place]) 06/28/2014  . Chronic pancreatitis ([place]) 06/28/2014  . HTN (hypertension) 06/28/2014  . OA (osteoarthritis) 06/28/2014  . Renal insufficiency 06/28/2014  . DDD (degenerative disc disease), cervical 06/28/2014  . COPD (chronic obstructive pulmonary disease) ([place]) 05/17/2014  . Breast cancer of upper-outer quadrant of right female breast ([place]) 05/31/2008   Mouhamad Teed Lenis Dickinson, SPT This entire session was performed under direct supervision and direction of a licensed therapist/therapist assistant . I have personally read, edited and approve of the note as written.  Trotter,Margaret PT, DPT 08/20/2017, 12:45 PM  [place] MAIN [facility] SERVICES [address] [place], [place], [address] Phone: [phone]   Fax:  [phone]  Name: ANNIEBELL BEDORE MRN: [id number] Date of Birth: [birth year]

## 2017-08-21 ENCOUNTER — Other Ambulatory Visit: Payer: Medicare Other | Admitting: Urology

## 2017-08-25 ENCOUNTER — Ambulatory Visit: Payer: Medicare Other | Attending: Pain Medicine | Admitting: Physical Therapy

## 2017-08-25 ENCOUNTER — Encounter: Payer: Self-pay | Admitting: Physical Therapy

## 2017-08-25 DIAGNOSIS — R2689 Other abnormalities of gait and mobility: Secondary | ICD-10-CM | POA: Insufficient documentation

## 2017-08-25 DIAGNOSIS — M6281 Muscle weakness (generalized): Secondary | ICD-10-CM | POA: Diagnosis not present

## 2017-08-25 DIAGNOSIS — R278 Other lack of coordination: Secondary | ICD-10-CM | POA: Diagnosis present

## 2017-08-25 NOTE — Therapy (Signed)
[place] MAIN [facility] SERVICES [address] [place], [place], [address] Phone: [phone]   Fax:  [phone]  Physical Therapy Treatment  Patient Details  Name: Elaine Clayton MRN: [id number] Date of Birth: [birth year] Referring Provider: Dr. Joanna Hews   Encounter Date: 08/25/2017      PT End of Session - 08/25/17 1329    Visit Number 6   Number of Visits 17   Date for PT Re-Evaluation 09/29/17   Authorization Type G-Code 6   PT Start Time 1315   PT Stop Time 1344   PT Time Calculation (min) 29 min   Equipment Utilized During Treatment Gait belt   Activity Tolerance Patient tolerated treatment well   Behavior During Therapy [facility] for tasks assessed/performed      Past Medical History:  Diagnosis Date  . Anemia   . Bipolar affective disorder ([place])    2  . Broken foot 2015   right foot  . Chronic gastritis   . Chronic kidney disease    Dr Holley Raring  . COPD (chronic obstructive pulmonary disease) ([place])   . Dizziness    inner ear (per pt) several times per week  . Dysphagia   . Glaucoma   . Hyperlipemia   . Hypertension    pt has recently come off BP meds.  MD aware. BP seems stable.  . Lithium toxicity 2015  . 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 Neck stiffness    s/p C3-C4 fusion, limited right turn  . Osteoarthritis    back  . Pancreatitis   . Personal history of malignant neoplasm of breast   . Pituitary microadenoma ([place])   . Recurrent UTI   . Renal insufficiency   . Stomach ulcer 2112    Past Surgical History:  Procedure Laterality Date  . APPENDECTOMY    . BREAST BIOPSY Right 2011   neg  . BREAST EXCISIONAL BIOPSY Right 2001   neg  . BREAST EXCISIONAL BIOPSY Right 2008   breast ca 2008 radiation  . BREAST EXCISIONAL BIOPSY Left 10/26/2012   neg  . BREAST SURGERY Left  2013   left breast wide excision,Intraductal papilloma,  ductal hyperplasia and sclerosing adenosis. Microcalcifications associated with columnar cell change. No evidence of atypia or malignancy. Margins are unremarkable.  Marland Kitchen BREAST SURGERY Right November 06, 1999   multiple areas of microcalcification showing evidence of sclerosing adenosis and ductal hyperplasia.  Marland Kitchen BREAST SURGERY Left May 07, 2007   wide excision.  . cataract Right 2013  . CATARACT EXTRACTION W/PHACO Left 03/05/2016   Procedure: CATARACT EXTRACTION PHACO AND INTRAOCULAR LENS PLACEMENT (IOC);  Surgeon: Leandrew Koyanagi, MD;  Location: [place];  Service: Ophthalmology;  Laterality: Left;  . COLONOSCOPY  2004  . COLONOSCOPY N/A 05/14/2015   Procedure: COLONOSCOPY;  Surgeon: Manya Silvas, MD;  Location: [facility] ENDOSCOPY;  Service: Endoscopy;  Laterality: N/A;  . ERCP N/A   . ESOPHAGOGASTRODUODENOSCOPY N/A 05/14/2015   Procedure: ESOPHAGOGASTRODUODENOSCOPY (EGD);  Surgeon: Manya Silvas, MD;  Location: [facility] ENDOSCOPY;  Service: Endoscopy;  Laterality: N/A;  . EUS N/A 2011, 2012  . EYE SURGERY  2013  . GLAUCOMA SURGERY Right 2013  . LAPAROSCOPIC RIGHT COLECTOMY Right 06/22/2015   Procedure: LAPAROSCOPIC RIGHT COLECTOMY;  Surgeon: Robert Bellow, MD;  Location: [facility] ORS;  Service: General;  Laterality: Right;  . NECK SURGERY  2006  . right breast  cancer    . SAVORY DILATION N/A 05/14/2015   Procedure: SAVORY DILATION;  Surgeon: Manya Silvas, MD;  Location: [facility] ENDOSCOPY;  Service: Endoscopy;  Laterality: N/A;  . TRABECULECTOMY Left 03/05/2016   Procedure: TRABECULECTOMY WITH [facility] AND EXPRESS SHUNT;  Surgeon: Leandrew Koyanagi, MD;  Location: [place];  Service: Ophthalmology;  Laterality: Left;  . TUBAL LIGATION      There were no vitals filed for this visit.      Subjective Assessment - 08/25/17 1315    Subjective Patient reports that she drove home from the beach today; she is having intermittant back pain; she reports no falls while at the  beach.   Pertinent History 71 yo Female with recent increase in back pain with subsequent numbness/tingling , weakness, and drop foot in B LEs, L>R starting about 3 months ago. She has had increased instability in gait and falls recently with minor injuries. Pt had recent epidural steroid injection L4-5, and L5-S1 with decrease in back pain since.Pt with extensive surgical and medical Hx including breast cancer (last surgery 2013), DJD with cervical fusion C3-5 in 2006, bipolar disorder, HTN, OA in bilateral shoulders L>R, hip and SIJ pain x >20 years.    Limitations Lifting;Standing;Walking;House hold activities   How long can you sit comfortably? Unlimited   How long can you stand comfortably? 20-30 minutes before balance gets worse   How long can you walk comfortably? 40' approx before drop foot or pain starts.    Diagnostic tests MRI 05/20/17: Chronic lumbar scoliosis with mild grade 1 spondylolisthesis from L3 L4 to L5-S1. Advanced disc and facet degeneration at those levels. Mild to moderate spinal and foraminal stenosis at L3-L4 and L4-L5. Right L5 radiculitis. Moderate neural foraminal stenosis also at the left L1 and both L2 nerve levels.   Patient Stated Goals To stop falling   Currently in Pain? Yes   Pain Score 5    Pain Location Back   Pain Orientation Lower   Pain Descriptors / Indicators Tightness   Pain Type Chronic pain   Pain Onset More than a month ago   Pain Frequency Intermittent   Aggravating Factors  Standing   Pain Relieving Factors Sitting, tylenol    Effect of Pain on Daily Activities Decreased activity       Treatment:     Neuro Re-education   Sitting:  BAPS board with L5       Ankle DF/PF x 10 on each LE with tactile cues to isolate motion. Pt required redirection to focus on task    Ankle pronation/supination x 10 each LE; pt had increased difficulty isolating motion, especially with supination on RLE. Pt given tactile and visual cues to assist in motion  isolation; cues to minimize knee and hip compensation.     Standing in parallel bars                 Tandem walking on Airex beam x 3 laps on 5' Airex beam. Pt cues to minimize UE support to fingertip support only. Pt had 2-3 minor LOB laterally requiring increased UE support to regain balance.                            Ankle rocking on rocker board for increased ankle proprioception for improved balance      PF/DF x 10 with tactile cues for increase AROM using therapists foot as target for tilt direction; pt cued to  increase AROM and minimize trunk lean to isolate ankle motion.       Lateral alternating x 10 with cues to bend contralateral knee for isolated movement; pt had multiple LOB due to inability to isolate ankle motion and tendency to maintain stance perpendicular to rocker board.           PT Education - 08/25/17 1327    Education provided Yes   Education Details Neuro re-education   Person(s) Educated Patient   Methods Explanation;Demonstration;Tactile cues;Verbal cues   Comprehension Tactile cues required;Verbal cues required;Returned demonstration;Verbalized understanding          PT Short Term Goals - 08/05/17 0839      PT SHORT TERM GOAL #1   Title Patient will deny any falls over past 4 weeks to demonstrate improved safety awareness at home and work.    Baseline Fall 2 days ago   Time 4   Period Weeks   Status New   Target Date 09/02/17     PT SHORT TERM GOAL #2   Title Pt will obtain and appropriately use LRAD (Rollator suggested) in order to decraese fall risk and increase community access.    Baseline Pt does not use AD   Time 2   Period Weeks   Status New   Target Date 08/19/17     PT SHORT TERM GOAL #3   Title --           PT Long Term Goals - 08/04/17 1730      PT LONG TERM GOAL #1   Title Patient will be independent in home exercise program to improve strength/mobility for better functional independence with ADLs.   Baseline HEP not  initiated   Time 8   Period Weeks   Status New   Target Date 09/29/17     PT LONG TERM GOAL #2   Title Patient will increase Berg Balance score by > 6 points to demonstrate decreased fall risk during functional activities.   Baseline Will assess next visit   Time 8   Period Weeks   Status New   Target Date 09/29/17     PT LONG TERM GOAL #3   Title Patient (> 69 years old) will complete five times sit to stand test in < 15 seconds indicating an increased LE strength and improved balance.   Baseline 22 sec indicating pt is high fall risk for her age   Time 71   Period Weeks   Status New   Target Date 09/30/17     PT LONG TERM GOAL #4   Title Patient will ascend/descend 4 stairs without rail assist independently without loss of balance to improve ability to get in/out of home.    Baseline Unsafe on stairs   Time 8   Period Weeks   Status New   Target Date 09/29/17     PT LONG TERM GOAL #5   Title Patient will increase BLE gross strength to 4/5 as to improve functional strength for independent gait, increased standing tolerance and increased ADL ability.   Baseline 2- to 4-/5 grossly   Time 8   Period Weeks   Status New   Target Date 09/29/17     Additional Long Term Goals   Additional Long Term Goals Yes     PT LONG TERM GOAL #6   Title --   Baseline --   Time --   Period --   Status --   Target Date --  Plan - 08/25/17 1846    Clinical Impression Statement Pt presents to therapy late limiting session. Pt instructed in neuro re-education for ankle proprioception and for increased balance. Pt had difficulty with ankle supination on BAPS board on RLE and decreased lateral stability in tandem activities in parallel bars. Pt also had difficulty on rocking board, worse with lateral rocking than in AP direction. Therapist continues to recommend that pt get a Rollator for indoor mobility to decrease her fall risk. Pt will continue to benefit from skilled  therapy to maximize her functional mobility and safety.     Rehab Potential Fair   Clinical Impairments Affecting Rehab Potential Pt does not use AD and had decreased knowledge of her limitations   PT Frequency 2x / week   PT Duration 8 weeks   PT Treatment/Interventions ADLs/Self Care Home Management;Aquatic Therapy;Biofeedback;Canalith Repostioning;Cryotherapy;Moist Heat;Cognitive remediation;Neuromuscular re-education;Balance training;Therapeutic exercise;Therapeutic activities;Functional mobility training;Stair training;Gait training;DME Instruction;Patient/family education;Orthotic Fit/Training;Manual techniques;Passive range of motion;Visual/perceptual remediation/compensation;Vestibular;Energy conservation;Dry needling   PT Next Visit Plan Ankle rocking on 1/2 bolster, BAPS board    PT Home Exercise Plan advanced- see patient instructions   Consulted and Agree with Plan of Care Patient      Patient will benefit from skilled therapeutic intervention in order to improve the following deficits and impairments:  Abnormal gait, Decreased activity tolerance, Decreased balance, Decreased cognition, Decreased coordination, Decreased safety awareness, Decreased mobility, Decreased knowledge of use of DME, Decreased knowledge of precautions, Decreased endurance, Decreased skin integrity, Decreased strength, Difficulty walking, Dizziness, Postural dysfunction, Improper body mechanics, Impaired vision/preception, Impaired UE functional use, Impaired sensation, Hypomobility, Decreased range of motion, Impaired flexibility, Pain  Visit Diagnosis: Muscle weakness (generalized)  Other lack of coordination  Other abnormalities of gait and mobility     Problem List Patient Active Problem List   Diagnosis Date Noted  . Lumbar facet joint syndrome (Primary Area of Pain) (Bilateral) (L>R) 07/29/2017  . Chronic sacroiliac joint pain (Bilateral) (L>R) 07/29/2017  . Chronic hip pain (Bilateral) (L>R)  07/29/2017  . Chronic lower extremity pain (Secondary Area of Pain) (Bilateral) (L>R) 07/29/2017  . Impairment of balance 07/07/2017  . Menopausal and female climacteric states 06/15/2017  . Lumbar foraminal stenosis (T12-S1, multilevel) 06/10/2017  . Lumbar lateral recess stenosis (left L2-3, bilateral L3-4, & right L4-5) 06/10/2017  . Grade 1 Anterolisthesis of L4 over L5 and L5 over S1 06/10/2017  . Lumbar facet hypertrophy (multilevel) 06/10/2017  . Lumbar Ligamentum flavum hypertrophy (HCC) (multilevel) 06/10/2017  . Dropfoot (Left) (L5 radiculopathy) 06/10/2017  . Neurogenic pain 06/09/2017  . DDD (degenerative disc disease), lumbar 06/09/2017  . 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 Closed fracture of metatarsal bone 05/06/2017  . 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 Abnormal MRI, cervical spine 05/06/2017  . 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[place]) 05/06/2017  . Cervical radiculitis 05/06/2017  . Chronic lumbar radiculopathy (Bilateral) (L>R) (left L5) 05/06/2017  . Chronic low back pain (Primary Area of Pain) (Bilateral) (L>R) 05/06/2017  . Abnormal MRI, lumbar spine 05/06/2017  . Chronic pain syndrome 03/19/2017  . Chronic shoulder pain (Fourth Area of Pain) (Bilateral) (L>R) 03/19/2017  . Osteoarthritis of shoulder (Bilateral) 03/19/2017  . Chronic neck pain [facility] Area of Pain) (Bilateral) (L>R) 03/19/2017  . History of C3-5 ACDF 03/19/2017  . Numbness and tingling in left upper extremity 03/19/2017  . Numbness and tingling of right upper extremity 03/19/2017  . Upper extremity weakness (Bilateral) (L>R) 03/19/2017  .  Numbness of upper extremity (Bilateral) (L>R) 03/19/2017  . Numbness and tingling of lower extremity (Bilateral) (L>R) 03/19/2017  . Weakness of lower extremity (Bilateral) (R>L) 03/19/2017  . Chronic abdominal pain (epigastric) 03/19/2017  .  Cervical syndrome 03/19/2017  . Cervical spine disease 12/08/2016  . Pituitary microadenoma ([place]) 12/08/2016  . History of breast cancer in adulthood 12/08/2016  . Dysmetria 12/08/2016  . Loss of weight 08/15/2015  . Colon polyp 05/25/2015  . Personal history of malignant neoplasm of breast 07/18/2014  . Breast cancer ([place]) 06/28/2014  . Bipolar disorder ([place]) 06/28/2014  . Chronic pancreatitis ([place]) 06/28/2014  . HTN (hypertension) 06/28/2014  . OA (osteoarthritis) 06/28/2014  . Renal insufficiency 06/28/2014  . DDD (degenerative disc disease), cervical 06/28/2014  . COPD (chronic obstructive pulmonary disease) ([place]) 05/17/2014  . Breast cancer of upper-outer quadrant of right female breast ([place]) 05/31/2008   Cashlynn Yearwood Lenis Dickinson, SPT This entire session was performed under direct supervision and direction of a licensed therapist/therapist assistant . I have personally read, edited and approve of the note as written.  Trotter,Margaret PT, DPT 08/26/2017, 9:29 AM  [place] MAIN [facility] SERVICES [address] [place], [place], [address] Phone: [phone]   Fax:  [phone]  Name: Elaine Clayton MRN: [id number] Date of Birth: September 21, 1946

## 2017-08-27 ENCOUNTER — Ambulatory Visit: Payer: Medicare Other | Admitting: Physical Therapy

## 2017-09-01 ENCOUNTER — Ambulatory Visit: Payer: Medicare Other | Admitting: Physical Therapy

## 2017-09-01 ENCOUNTER — Encounter: Payer: Self-pay | Admitting: Physical Therapy

## 2017-09-01 DIAGNOSIS — M6281 Muscle weakness (generalized): Secondary | ICD-10-CM | POA: Diagnosis not present

## 2017-09-01 DIAGNOSIS — R278 Other lack of coordination: Secondary | ICD-10-CM

## 2017-09-01 DIAGNOSIS — R2689 Other abnormalities of gait and mobility: Secondary | ICD-10-CM

## 2017-09-01 NOTE — Therapy (Signed)
[place] MAIN [facility] SERVICES [address] [place], [place], [address] Phone: [phone]   Fax:  [phone]  Physical Therapy Treatment  Patient Details  Name: Elaine Clayton MRN: [id number] Date of Birth: 11/13/1946 Referring Provider: Dr. Joanna Hews   Encounter Date: 09/01/2017      PT End of Session - 09/01/17 1424    Visit Number 7   Number of Visits 17   Date for PT Re-Evaluation 09/29/17   Authorization Type G-Code 7   PT Start Time 1330   PT Stop Time 1415   PT Time Calculation (min) 45 min   Equipment Utilized During Treatment Gait belt   Activity Tolerance Patient tolerated treatment well   Behavior During Therapy [facility] for tasks assessed/performed      Past Medical History:  Diagnosis Date  . Anemia   . Bipolar affective disorder ([place])    2  . Broken foot 2015   right foot  . Chronic gastritis   . Chronic kidney disease    Dr Holley Raring  . COPD (chronic obstructive pulmonary disease) ([place])   . Dizziness    inner ear (per pt) several times per week  . Dysphagia   . Glaucoma   . Hyperlipemia   . Hypertension    pt has recently come off BP meds.  MD aware. BP seems stable.  . Lithium toxicity 2015  . 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 Neck stiffness    s/p C3-C4 fusion, limited right turn  . Osteoarthritis    back  . Pancreatitis   . Personal history of malignant neoplasm of breast   . Pituitary microadenoma ([place])   . Recurrent UTI   . Renal insufficiency   . Stomach ulcer 2112    Past Surgical History:  Procedure Laterality Date  . APPENDECTOMY    . BREAST BIOPSY Right 2011   neg  . BREAST EXCISIONAL BIOPSY Right 2001   neg  . BREAST EXCISIONAL BIOPSY Right 2008   breast ca 2008 radiation  . BREAST EXCISIONAL BIOPSY Left 10/26/2012   neg  . BREAST SURGERY Left  2013   left breast wide excision,Intraductal papilloma,  ductal hyperplasia and sclerosing adenosis. Microcalcifications associated with columnar cell change. No evidence of atypia or malignancy. Margins are unremarkable.  Marland Kitchen BREAST SURGERY Right November 06, 1999   multiple areas of microcalcification showing evidence of sclerosing adenosis and ductal hyperplasia.  Marland Kitchen BREAST SURGERY Left May 07, 2007   wide excision.  . cataract Right 2013  . CATARACT EXTRACTION W/PHACO Left 03/05/2016   Procedure: CATARACT EXTRACTION PHACO AND INTRAOCULAR LENS PLACEMENT (IOC);  Surgeon: Leandrew Koyanagi, MD;  Location: [place];  Service: Ophthalmology;  Laterality: Left;  . COLONOSCOPY  2004  . COLONOSCOPY N/A 05/14/2015   Procedure: COLONOSCOPY;  Surgeon: Manya Silvas, MD;  Location: [facility] ENDOSCOPY;  Service: Endoscopy;  Laterality: N/A;  . ERCP N/A   . ESOPHAGOGASTRODUODENOSCOPY N/A 05/14/2015   Procedure: ESOPHAGOGASTRODUODENOSCOPY (EGD);  Surgeon: Manya Silvas, MD;  Location: [facility] ENDOSCOPY;  Service: Endoscopy;  Laterality: N/A;  . EUS N/A 2011, 2012  . EYE SURGERY  2013  . GLAUCOMA SURGERY Right 2013  . LAPAROSCOPIC RIGHT COLECTOMY Right 06/22/2015   Procedure: LAPAROSCOPIC RIGHT COLECTOMY;  Surgeon: Robert Bellow, MD;  Location: [facility] ORS;  Service: General;  Laterality: Right;  . NECK SURGERY  2006  . right breast  cancer    . SAVORY DILATION N/A 05/14/2015   Procedure: SAVORY DILATION;  Surgeon: Manya Silvas, MD;  Location: [facility] ENDOSCOPY;  Service: Endoscopy;  Laterality: N/A;  . TRABECULECTOMY Left 03/05/2016   Procedure: TRABECULECTOMY WITH [facility] AND [facility] SHUNT;  Surgeon: Leandrew Koyanagi, MD;  Location: [place];  Service: Ophthalmology;  Laterality: Left;  . TUBAL LIGATION      There were no vitals filed for this visit.      Subjective Assessment - 09/01/17 1330    Subjective Patient reports that she is doing better today with no back pain. She reports no injuries and no pain at this time.   Pertinent  History 71 yo Female with recent increase in back pain with subsequent numbness/tingling , weakness, and drop foot in B LEs, L>R starting about 3 months ago. She has had increased instability in gait and falls recently with minor injuries. Pt had recent epidural steroid injection L4-5, and L5-S1 with decrease in back pain since.Pt with extensive surgical and medical Hx including breast cancer (last surgery 2013), DJD with cervical fusion C3-5 in 2006, bipolar disorder, HTN, OA in bilateral shoulders L>R, hip and SIJ pain x >20 years.    Limitations Lifting;Standing;Walking;House hold activities   How long can you sit comfortably? Unlimited   How long can you stand comfortably? 20-30 minutes before balance gets worse   How long can you walk comfortably? 40' approx before drop foot or pain starts.    Diagnostic tests MRI 05/20/17: Chronic lumbar scoliosis with mild grade 1 spondylolisthesis from L3 L4 to L5-S1. Advanced disc and facet degeneration at those levels. Mild to moderate spinal and foraminal stenosis at L3-L4 and L4-L5. Right L5 radiculitis. Moderate neural foraminal stenosis also at the left L1 and both L2 nerve levels.   Patient Stated Goals To stop falling   Currently in Pain? No/denies   Pain Onset More than a month ago        Treatment:     Neuro Re-education    Standing in parallel bars    Step-ups   Anterior with 6" step x 10 steps up with each LE, no UE support with CGA for safety; pt with multiple LOB and poor postural control; pt cued to slow down and to concentrate on centering weight over stance leg. 1/2 bolster added at edge of step to encourage pt to take larger step to increase foot clearance.   Lateral step-ups x 10 each direction up and over opposite side of step with no UE support; cues to take large steps to make room for second foot to step onto or beside step. Pt with multiple LOB in multiple directions requiring intermittent UE assist and min-modA to prevent  fall.  Toe taps x 20 alternating on each LE on 6" step with Dynadisc; cue to minimize UE assist; pt cued to avoid leaning on the bars for stability. Pt frequently would no weight shift onto LLE with toe tap using RLE causing LOB to the R, despite verbal and tactile cueing.    Gait:   Stairs alternating steps with intermittent UE support 10 reps x 4 steps up and down; pt requires intermittent minA to prevent fall when climbing and descending stair without UE assist, worse with descent. Pt has decreased stability due to weakness and poor balance. Pt cued to attempt to climb steps faster with increased momentum and decreased time in single support. This increased pt;s stability with stair climbing.   There-ex:   Leg  press:   BLE  3 reps at 90#    3 reps at 120# (pt with increased difficulty)    2 x 12 at 75# (approx 50-60% of 1 rep max).pt cued to move with slower and more controlled eccentric movement and to increase AROM; cues to avoid full knee extension with concentric motion.     Heel raises with BLEs 2 x 20 at 75#; cues to keep LEs straight and to isolate movement; cues to slow down for better control and AROM.   Next session: Continue LE extensor strengthening; mini-squats, single leg press, balance training for hip and knee control (large rocker board side to side)        PT Education - 09/01/17 1425    Education provided Yes   Education Details Neuro re-education, ther-ex, gait.   Person(s) Educated Patient   Methods Demonstration;Tactile cues;Verbal cues;Handout;Explanation   Comprehension Verbalized understanding;Returned demonstration;Tactile cues required          PT Short Term Goals - 08/05/17 0839      PT SHORT TERM GOAL #1   Title Patient will deny any falls over past 4 weeks to demonstrate improved safety awareness at home and work.    Baseline Fall 2 days ago   Time 4   Period Weeks   Status New   Target Date 09/02/17     PT SHORT TERM GOAL #2   Title Pt  will obtain and appropriately use LRAD (Rollator suggested) in order to decraese fall risk and increase community access.    Baseline Pt does not use AD   Time 2   Period Weeks   Status New   Target Date 08/19/17     PT SHORT TERM GOAL #3   Title --           PT Long Term Goals - 08/04/17 1730      PT LONG TERM GOAL #1   Title Patient will be independent in home exercise program to improve strength/mobility for better functional independence with ADLs.   Baseline HEP not initiated   Time 8   Period Weeks   Status New   Target Date 09/29/17     PT LONG TERM GOAL #2   Title Patient will increase Berg Balance score by > 6 points to demonstrate decreased fall risk during functional activities.   Baseline Will assess next visit   Time 8   Period Weeks   Status New   Target Date 09/29/17     PT LONG TERM GOAL #3   Title Patient (> 73 years old) will complete five times sit to stand test in < 15 seconds indicating an increased LE strength and improved balance.   Baseline 22 sec indicating pt is high fall risk for her age   Time 20   Period Weeks   Status New   Target Date 09/30/17     PT LONG TERM GOAL #4   Title Patient will ascend/descend 4 stairs without rail assist independently without loss of balance to improve ability to get in/out of home.    Baseline Unsafe on stairs   Time 8   Period Weeks   Status New   Target Date 09/29/17     PT LONG TERM GOAL #5   Title Patient will increase BLE gross strength to 4/5 as to improve functional strength for independent gait, increased standing tolerance and increased ADL ability.   Baseline 2- to 4-/5 grossly   Time 8   Period Weeks  Status New   Target Date 09/29/17     Additional Long Term Goals   Additional Long Term Goals Yes     PT LONG TERM GOAL #6   Title --   Baseline --   Time --   Period --   Status --   Target Date --               Plan - 09/01/17 1424    Clinical Impression Statement Pt  instructed in in balance and gait training with emphasis on weight shifting onto stance leg with toe tapping and step-ups and step-downs. Pt had intermittent LOB generally due to insufficient weight shift causing LOB away from stance leg. Pt did show in-session improvement with cues to slow down and concentrate on improving postural control. Pt also instructed in ther-ex for LE strengthening to improve hip, knee, and ankle control with functional balance activities. Pt will continue to benefit from skilled therapy to maximize functional mobility and safety.    Rehab Potential Fair   Clinical Impairments Affecting Rehab Potential Pt does not use AD and had decreased knowledge of her limitations   PT Frequency 2x / week   PT Duration 8 weeks   PT Treatment/Interventions ADLs/Self Care Home Management;Aquatic Therapy;Biofeedback;Canalith Repostioning;Cryotherapy;Moist Heat;Cognitive remediation;Neuromuscular re-education;Balance training;Therapeutic exercise;Therapeutic activities;Functional mobility training;Stair training;Gait training;DME Instruction;Patient/family education;Orthotic Fit/Training;Manual techniques;Passive range of motion;Visual/perceptual remediation/compensation;Vestibular;Energy conservation;Dry needling   PT Next Visit Plan Ankle rocking on 1/2 bolster, BAPS board    PT Home Exercise Plan advanced- see patient instructions   Consulted and Agree with Plan of Care Patient      Patient will benefit from skilled therapeutic intervention in order to improve the following deficits and impairments:  Abnormal gait, Decreased activity tolerance, Decreased balance, Decreased cognition, Decreased coordination, Decreased safety awareness, Decreased mobility, Decreased knowledge of use of DME, Decreased knowledge of precautions, Decreased endurance, Decreased skin integrity, Decreased strength, Difficulty walking, Dizziness, Postural dysfunction, Improper body mechanics, Impaired  vision/preception, Impaired UE functional use, Impaired sensation, Hypomobility, Decreased range of motion, Impaired flexibility, Pain  Visit Diagnosis: Muscle weakness (generalized)  Other lack of coordination  Other abnormalities of gait and mobility     Problem List Patient Active Problem List   Diagnosis Date Noted  . Lumbar facet joint syndrome (Primary Area of Pain) (Bilateral) (L>R) 07/29/2017  . Chronic sacroiliac joint pain (Bilateral) (L>R) 07/29/2017  . Chronic hip pain (Bilateral) (L>R) 07/29/2017  . Chronic lower extremity pain (Secondary Area of Pain) (Bilateral) (L>R) 07/29/2017  . Impairment of balance 07/07/2017  . Menopausal and female climacteric states 06/15/2017  . Lumbar foraminal stenosis (T12-S1, multilevel) 06/10/2017  . Lumbar lateral recess stenosis (left L2-3, bilateral L3-4, & right L4-5) 06/10/2017  . Grade 1 Anterolisthesis of L4 over L5 and L5 over S1 06/10/2017  . Lumbar facet hypertrophy (multilevel) 06/10/2017  . Lumbar Ligamentum flavum hypertrophy (HCC) (multilevel) 06/10/2017  . Dropfoot (Left) (L5 radiculopathy) 06/10/2017  . Neurogenic pain 06/09/2017  . DDD (degenerative disc disease), lumbar 06/09/2017  . 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 Closed fracture of metatarsal bone 05/06/2017  . 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 Abnormal MRI, cervical spine 05/06/2017  . 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[place]) 05/06/2017  . Cervical radiculitis 05/06/2017  . Chronic lumbar radiculopathy (Bilateral) (L>R) (left L5) 05/06/2017  . Chronic low back pain (Primary Area of Pain) (Bilateral) (L>R) 05/06/2017  . Abnormal MRI, lumbar spine 05/06/2017  .  Chronic pain syndrome 03/19/2017  . Chronic shoulder pain (Fourth Area of Pain) (Bilateral) (L>R) 03/19/2017  . Osteoarthritis of shoulder (Bilateral) 03/19/2017  . Chronic neck pain  [facility] Area of Pain) (Bilateral) (L>R) 03/19/2017  . History of C3-5 ACDF 03/19/2017  . Numbness and tingling in left upper extremity 03/19/2017  . Numbness and tingling of right upper extremity 03/19/2017  . Upper extremity weakness (Bilateral) (L>R) 03/19/2017  . Numbness of upper extremity (Bilateral) (L>R) 03/19/2017  . Numbness and tingling of lower extremity (Bilateral) (L>R) 03/19/2017  . Weakness of lower extremity (Bilateral) (R>L) 03/19/2017  . Chronic abdominal pain (epigastric) 03/19/2017  . Cervical syndrome 03/19/2017  . Cervical spine disease 12/08/2016  . Pituitary microadenoma ([place]) 12/08/2016  . History of breast cancer in adulthood 12/08/2016  . Dysmetria 12/08/2016  . Loss of weight 08/15/2015  . Colon polyp 05/25/2015  . Personal history of malignant neoplasm of breast 07/18/2014  . Breast cancer ([place]) 06/28/2014  . Bipolar disorder ([place]) 06/28/2014  . Chronic pancreatitis ([place]) 06/28/2014  . HTN (hypertension) 06/28/2014  . OA (osteoarthritis) 06/28/2014  . Renal insufficiency 06/28/2014  . DDD (degenerative disc disease), cervical 06/28/2014  . COPD (chronic obstructive pulmonary disease) ([place]) 05/17/2014  . Breast cancer of upper-outer quadrant of right female breast ([place]) 05/31/2008   Jahi Roza Lenis Dickinson, SPT This entire session was performed under direct supervision and direction of a licensed therapist/therapist assistant . I have personally read, edited and approve of the note as written.    Trotter,Margaret PT, DPT 09/02/2017, 3:33 PM  [place] MAIN [facility] SERVICES [address] [place], [place], [address] Phone: [phone]   Fax:  [phone]  Name: Elaine Clayton MRN: [id number] Date of Birth: 01/14/1946

## 2017-09-01 NOTE — Patient Instructions (Addendum)
Oculomotor: Saccades    Holding two targets positioned side by side __12__ inches apart, move eyes quickly from target to target as head stays still. Move __30__ seconds each direction. Perform sitting. Repeat __2__ times per session. Do __2__ sessions per day.  Copyright  VHI. All rights reserved.

## 2017-09-03 ENCOUNTER — Encounter: Payer: Self-pay | Admitting: Physical Therapy

## 2017-09-03 ENCOUNTER — Ambulatory Visit: Payer: Medicare Other | Admitting: Physical Therapy

## 2017-09-03 DIAGNOSIS — R278 Other lack of coordination: Secondary | ICD-10-CM

## 2017-09-03 DIAGNOSIS — M6281 Muscle weakness (generalized): Secondary | ICD-10-CM | POA: Diagnosis not present

## 2017-09-03 DIAGNOSIS — R2689 Other abnormalities of gait and mobility: Secondary | ICD-10-CM

## 2017-09-03 NOTE — Therapy (Signed)
[place] MAIN [facility] SERVICES [address] [place], [place], [address] Phone: [phone]   Fax:  [phone]  Physical Therapy Treatment/Progress Note  Patient Details  Name: Elaine Clayton MRN: [id number] Date of Birth: 1946-02-01 Referring Provider: Dr. Joanna Hews   Encounter Date: 09/03/2017      PT End of Session - 09/03/17 1811    Visit Number 8   Number of Visits 17   Date for PT Re-Evaluation 09/29/17   Authorization Type G-Code 8   PT Start Time 1302   PT Stop Time 1345   PT Time Calculation (min) 43 min   Equipment Utilized During Treatment Gait belt   Activity Tolerance Patient tolerated treatment well;No increased pain   Behavior During Therapy WFL for tasks assessed/performed      Past Medical History:  Diagnosis Date  . Anemia   . Bipolar affective disorder ([place])    2  . Broken foot 2015   right foot  . Chronic gastritis   . Chronic kidney disease    Dr Holley Raring  . COPD (chronic obstructive pulmonary disease) ([place])   . Dizziness    inner ear (per pt) several times per week  . Dysphagia   . Glaucoma   . Hyperlipemia   . Hypertension    pt has recently come off BP meds.  MD aware. BP seems stable.  . Lithium toxicity 2015  . 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 Neck stiffness    s/p C3-C4 fusion, limited right turn  . Osteoarthritis    back  . Pancreatitis   . Personal history of malignant neoplasm of breast   . Pituitary microadenoma ([place])   . Recurrent UTI   . Renal insufficiency   . Stomach ulcer 2112    Past Surgical History:  Procedure Laterality Date  . APPENDECTOMY    . BREAST BIOPSY Right 2011   neg  . BREAST EXCISIONAL BIOPSY Right 2001   neg  . BREAST EXCISIONAL BIOPSY Right 2008   breast ca 2008 radiation  . BREAST EXCISIONAL BIOPSY Left 10/26/2012   neg  . BREAST SURGERY Left  2013   left breast wide  excision,Intraductal papilloma, ductal hyperplasia and sclerosing adenosis. Microcalcifications associated with columnar cell change. No evidence of atypia or malignancy. Margins are unremarkable.  Marland Kitchen BREAST SURGERY Right November 06, 1999   multiple areas of microcalcification showing evidence of sclerosing adenosis and ductal hyperplasia.  Marland Kitchen BREAST SURGERY Left May 07, 2007   wide excision.  . cataract Right 2013  . CATARACT EXTRACTION W/PHACO Left 03/05/2016   Procedure: CATARACT EXTRACTION PHACO AND INTRAOCULAR LENS PLACEMENT (IOC);  Surgeon: Leandrew Koyanagi, MD;  Location: [place];  Service: Ophthalmology;  Laterality: Left;  . COLONOSCOPY  2004  . COLONOSCOPY N/A 05/14/2015   Procedure: COLONOSCOPY;  Surgeon: Manya Silvas, MD;  Location: [facility] ENDOSCOPY;  Service: Endoscopy;  Laterality: N/A;  . ERCP N/A   . ESOPHAGOGASTRODUODENOSCOPY N/A 05/14/2015   Procedure: ESOPHAGOGASTRODUODENOSCOPY (EGD);  Surgeon: Manya Silvas, MD;  Location[facility] END[facility]COPY;  Service: Endoscopy;  Laterality: N/A;  . EUS N/A 2011, 2012  . EYE SURGERY  2013  . GLAUCOMA SURGERY Right 2013  . LAPAROSCOPIC RIGHT COLECTOMY Right 06/22/2015   Procedure: LAPAROSCOPIC RIGHT COLECTOMY;  Surgeon: Robert Bellow, MD;  Location: [facility] ORS;  Service: General;  Laterality: Right;  . NECK SURGERY  2006  .  right breast cancer    . SAVORY DILATION N/A 05/14/2015   Procedure: SAVORY DILATION;  Surgeon: Manya Silvas, MD;  Location: [facility] ENDOSCOPY;  Service: Endoscopy;  Laterality: N/A;  . TRABECULECTOMY Left 03/05/2016   Procedure: TRABECULECTOMY WITH [facility] AND EXPRESS SHUNT;  Surgeon: Leandrew Koyanagi, MD;  Location: [place];  Service: Ophthalmology;  Laterality: Left;  . TUBAL LIGATION      There were no vitals filed for this visit.      Subjective Assessment - 09/03/17 1307    Subjective Patient reports that she is doing ok today, but has been having some mild LBP for a day or two after  doing the leg press. Currently she reports no pain. Pt fell yesterday in the dollar tree when getting up from a crouched posture; also reports multiple LOB on her stairs at home with frequent bumping into the walls on her side   Pertinent History 71 yo Female with recent increase in back pain with subsequent numbness/tingling , weakness, and drop foot in B LEs, L>R starting about 3 months ago. She has had increased instability in gait and falls recently with minor injuries. Pt had recent epidural steroid injection L4-5, and L5-S1 with decrease in back pain since.Pt with extensive surgical and medical Hx including breast cancer (last surgery 2013), DJD with cervical fusion C3-5 in 2006, bipolar disorder, HTN, OA in bilateral shoulders L>R, hip and SIJ pain x >20 years.    Limitations Lifting;Standing;Walking;House hold activities   How long can you sit comfortably? Unlimited   How long can you stand comfortably? 20-30 minutes before balance gets worse   How long can you walk comfortably? 40' approx before drop foot or pain starts.    Diagnostic tests MRI 05/20/17: Chronic lumbar scoliosis with mild grade 1 spondylolisthesis from L3 L4 to L5-S1. Advanced disc and facet degeneration at those levels. Mild to moderate spinal and foraminal stenosis at L3-L4 and L4-L5. Right L5 radiculitis. Moderate neural foraminal stenosis also at the left L1 and both L2 nerve levels.   Patient Stated Goals To stop falling   Currently in Pain? No/denies   Pain Onset More than a month ago               Treatment:   Goals Re-assessed:   5x sit<> stand: 23 sec with BUE assist, which is not significantly changed from last test on 08/05/17 which was 22 sec indicating pt is high fall risk for her age       Strength Hips: 4-/5, knees: 4-/5, ankles: 4-/5 grossly bilaterally;   Previously 8/14: STRENGTH:  Graded on a 0-5 scale Muscle Group Left Right      Hip Flex 2- 2  Hip Abd 3 3  Hip Add 3- 4-           Knee Flex 3+ 4-  Knee Ext 4- 4-  Ankle DF 2+ 3-    FGA: 16/30 indicating pt is high fall risk, improved from 10/30 on 8/15  Neuro Re-education    Standing in parallel bars       Large rocker board, side-to-side with fingertip assist x 25 reps each directions; cues for weight shifting onto stance leg, cues to alternately bend knees to tip board, cues to slow down and avoid making boards hit the ground loudly. Pt showed in-session improvement and was able to perform inconsistently without UE assist with frequent posterior LOB         PT Education - 09/03/17 1810  Education provided Yes   Education Details Progress towards goals, balance training, safety awareness   Person(s) Educated Patient   Methods Explanation;Demonstration;Tactile cues;Verbal cues   Comprehension Verbal cues required;Tactile cues required;Returned demonstration;Verbalized understanding          PT Short Term Goals - 09/03/17 1312      PT SHORT TERM GOAL #1   Title Patient will deny any falls over past 4 weeks to demonstrate improved safety awareness at home and work.    Baseline Pt had a fall yesterday without injury, and multiple falls since last goal assessment   Time 4   Period Weeks   Status On-going   Target Date 09/02/17     PT SHORT TERM GOAL #2   Title Pt will obtain and appropriately use LRAD (Rollator suggested) in order to decraese fall risk and increase community access.    Baseline Pt has obtained Rollator for use outside   Time 2   Period Weeks   Status Achieved   Target Date 08/19/17           PT Long Term Goals - 09/03/17 1315      PT LONG TERM GOAL #1   Title Patient will be independent in home exercise program to improve strength/mobility for better functional independence with ADLs.   Baseline Pt does part of her HEP about 3-4 times per week.   Time 8   Period Weeks   Status Partially Met   Target Date 09/29/17     PT LONG TERM GOAL #2   Title Patient will  increase Berg Balance score by > 6 points to demonstrate decreased fall risk during functional activities.; Deferred as patient's balance is more impaired dynamically and therefore FGA goal more appropriate;    Baseline will defer at this time;    Time 8   Period Weeks   Status Deferred   Target Date 09/29/17     PT LONG TERM GOAL #3   Title Patient (> 95 years old) will complete five times sit to stand test in < 15 seconds indicating an increased LE strength and improved balance.   Baseline 22 sec indicating pt is high fall risk for her age      9/13: 23 sec with BUE assist indicating pt is high fall risk for her age      Time 10   Period Weeks   Status On-going   Target Date 09/29/17     PT LONG TERM GOAL #4   Title Patient will ascend/descend 4 stairs without rail assist independently without loss of balance to improve ability to get in/out of home.    Baseline Unsafe on stairs without UE assist   Time 8   Period Weeks   Status On-going   Target Date 09/29/17     PT LONG TERM GOAL #5   Title Patient will increase BLE gross strength to 4/5 as to improve functional strength for independent gait, increased standing tolerance and increased ADL ability.   Baseline 2- to 4-/5 grossly; 09/03/17 grossly 4-/5 in BLEs   Time 8   Period Weeks   Status Partially Met   Target Date 09/29/17     PT LONG TERM GOAL #6   Title Patient will increase Functional Gait Assessment score to >20/30 as to reduce fall risk and improve dynamic gait safety with community ambulation.   Baseline 16/30 indicating pt is a high fall risk on 09/03/17   Time 8   Period Weeks  Status New   Target Date 09/29/17               Plan - 09/03/17 1813    Clinical Impression Statement Pt re-assessed in goals with great progress towards strength and functional gait goals. Pt is still considered a high fall risk with no improvement in 5x sit<>stand. Functional gait assessment score improved from 10/30 to 16/30,  though pt is still considered to be a high fall risk. She appears to demonstrate greater impairement in dynamic balance and therefore goals adjusted to focus on FGA score rather than Berg balance assessment. Strength has improved significantly with pt grossly 4-/5 in all muscle groups tested in BLEs. Pt continues to have frequent falls and shows decreased safety awareness by assuming crouched postures without UE support in the grocery store, etc. Pt will continue to benefit from skilled therapy to further increase her strength and functional gait for better functional mobility and safety   Rehab Potential Fair   Clinical Impairments Affecting Rehab Potential Pt does not use AD and had decreased knowledge of her limitations   PT Frequency 2x / week   PT Duration 8 weeks   PT Treatment/Interventions ADLs/Self Care Home Management;Aquatic Therapy;Biofeedback;Canalith Repostioning;Cryotherapy;Moist Heat;Cognitive remediation;Neuromuscular re-education;Balance training;Therapeutic exercise;Therapeutic activities;Functional mobility training;Stair training;Gait training;DME Instruction;Patient/family education;Orthotic Fit/Training;Manual techniques;Passive range of motion;Visual/perceptual remediation/compensation;Vestibular;Energy conservation;Dry needling   PT Next Visit Plan Ankle rocking on 1/2 bolster, BAPS board    PT Home Exercise Plan advanced- see patient instructions   Consulted and Agree with Plan of Care Patient      Patient will benefit from skilled therapeutic intervention in order to improve the following deficits and impairments:  Abnormal gait, Decreased activity tolerance, Decreased balance, Decreased cognition, Decreased coordination, Decreased safety awareness, Decreased mobility, Decreased knowledge of use of DME, Decreased knowledge of precautions, Decreased endurance, Decreased skin integrity, Decreased strength, Difficulty walking, Dizziness, Postural dysfunction, Improper body  mechanics, Impaired vision/preception, Impaired UE functional use, Impaired sensation, Hypomobility, Decreased range of motion, Impaired flexibility, Pain  Visit Diagnosis: Muscle weakness (generalized)  Other lack of coordination  Other abnormalities of gait and mobility     Problem List Patient Active Problem List   Diagnosis Date Noted  . Lumbar facet joint syndrome (Primary Area of Pain) (Bilateral) (L>R) 07/29/2017  . Chronic sacroiliac joint pain (Bilateral) (L>R) 07/29/2017  . Chronic hip pain (Bilateral) (L>R) 07/29/2017  . Chronic lower extremity pain (Secondary Area of Pain) (Bilateral) (L>R) 07/29/2017  . Impairment of balance 07/07/2017  . Menopausal and female climacteric states 06/15/2017  . Lumbar foraminal stenosis (T12-S1, multilevel) 06/10/2017  . Lumbar lateral recess stenosis (left L2-3, bilateral L3-4, & right L4-5) 06/10/2017  . Grade 1 Anterolisthesis of L4 over L5 and L5 over S1 06/10/2017  . Lumbar facet hypertrophy (multilevel) 06/10/2017  . Lumbar Ligamentum flavum hypertrophy (HCC) (multilevel) 06/10/2017  . Dropfoot (Left) (L5 radiculopathy) 06/10/2017  . Neurogenic pain 06/09/2017  . DDD (degenerative disc disease), lumbar 06/09/2017  . 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 Closed fracture of metatarsal bone 05/06/2017  . 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 Abnormal MRI, cervical spine 05/06/2017  . 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[place]) 05/06/2017  . Cervical radiculitis 05/06/2017  . Chronic lumbar radiculopathy (Bilateral) (L>R) (left L5) 05/06/2017  . Chronic low back pain (Primary Area of Pain) (Bilateral) (L>R) 05/06/2017  . Abnormal MRI, lumbar spine 05/06/2017  . Chronic pain syndrome 03/19/2017  . Chronic shoulder  pain (Fourth Area of Pain) (Bilateral) (L>R) 03/19/2017  . Osteoarthritis of shoulder (Bilateral) 03/19/2017  .  Chronic neck pain [facility] Area of Pain) (Bilateral) (L>R) 03/19/2017  . History of C3-5 ACDF 03/19/2017  . Numbness and tingling in left upper extremity 03/19/2017  . Numbness and tingling of right upper extremity 03/19/2017  . Upper extremity weakness (Bilateral) (L>R) 03/19/2017  . Numbness of upper extremity (Bilateral) (L>R) 03/19/2017  . Numbness and tingling of lower extremity (Bilateral) (L>R) 03/19/2017  . Weakness of lower extremity (Bilateral) (R>L) 03/19/2017  . Chronic abdominal pain (epigastric) 03/19/2017  . Cervical syndrome 03/19/2017  . Cervical spine disease 12/08/2016  . Pituitary microadenoma ([place]) 12/08/2016  . History of breast cancer in adulthood 12/08/2016  . Dysmetria 12/08/2016  . Loss of weight 08/15/2015  . Colon polyp 05/25/2015  . Personal history of malignant neoplasm of breast 07/18/2014  . Breast cancer ([place]) 06/28/2014  . Bipolar disorder ([place]) 06/28/2014  . Chronic pancreatitis ([place]) 06/28/2014  . HTN (hypertension) 06/28/2014  . OA (osteoarthritis) 06/28/2014  . Renal insufficiency 06/28/2014  . DDD (degenerative disc disease), cervical 06/28/2014  . COPD (chronic obstructive pulmonary disease) ([place]) 05/17/2014  . Breast cancer of upper-outer quadrant of right female breast ([place]) 05/31/2008   Deaira Leckey Lenis Dickinson, SPT This entire session was performed under direct supervision and direction of a licensed therapist/therapist assistant . I have personally read, edited and approve of the note as written.  Trotter,Margaret PT, DPT 09/04/2017, 10:08 AM  [place] MAIN [facility] SERVICES [address] [place], [place], [address] Phone: [phone]   Fax:  [phone]  Name: SHEANA BIR MRN: [id number] Date of Birth: Mar 26, 1946

## 2017-09-08 ENCOUNTER — Ambulatory Visit: Payer: Medicare Other | Admitting: Physical Therapy

## 2017-09-09 ENCOUNTER — Encounter: Payer: Self-pay | Admitting: Pain Medicine

## 2017-09-09 ENCOUNTER — Ambulatory Visit: Payer: Medicare Other | Attending: Pain Medicine | Admitting: Pain Medicine

## 2017-09-09 VITALS — BP 130/77 | HR 67 | Temp 98.0°F | Resp 15 | Ht 65.0 in | Wt 102.0 lb

## 2017-09-09 DIAGNOSIS — M5442 Lumbago with sciatica, left side: Secondary | ICD-10-CM | POA: Diagnosis not present

## 2017-09-09 DIAGNOSIS — M5416 Radiculopathy, lumbar region: Secondary | ICD-10-CM | POA: Diagnosis not present

## 2017-09-09 DIAGNOSIS — M79604 Pain in right leg: Secondary | ICD-10-CM | POA: Diagnosis not present

## 2017-09-09 DIAGNOSIS — M21372 Foot drop, left foot: Secondary | ICD-10-CM | POA: Insufficient documentation

## 2017-09-09 DIAGNOSIS — M4696 Unspecified inflammatory spondylopathy, lumbar region: Secondary | ICD-10-CM

## 2017-09-09 DIAGNOSIS — C50411 Malignant neoplasm of upper-outer quadrant of right female breast: Secondary | ICD-10-CM | POA: Insufficient documentation

## 2017-09-09 DIAGNOSIS — Z780 Asymptomatic menopausal state: Secondary | ICD-10-CM | POA: Diagnosis not present

## 2017-09-09 DIAGNOSIS — M4316 Spondylolisthesis, lumbar region: Secondary | ICD-10-CM | POA: Insufficient documentation

## 2017-09-09 DIAGNOSIS — M549 Dorsalgia, unspecified: Secondary | ICD-10-CM | POA: Diagnosis present

## 2017-09-09 DIAGNOSIS — R531 Weakness: Secondary | ICD-10-CM | POA: Diagnosis not present

## 2017-09-09 DIAGNOSIS — G8929 Other chronic pain: Secondary | ICD-10-CM

## 2017-09-09 DIAGNOSIS — G629 Polyneuropathy, unspecified: Secondary | ICD-10-CM | POA: Insufficient documentation

## 2017-09-09 DIAGNOSIS — I10 Essential (primary) hypertension: Secondary | ICD-10-CM | POA: Diagnosis not present

## 2017-09-09 DIAGNOSIS — D649 Anemia, unspecified: Secondary | ICD-10-CM | POA: Insufficient documentation

## 2017-09-09 DIAGNOSIS — M25551 Pain in right hip: Secondary | ICD-10-CM | POA: Diagnosis not present

## 2017-09-09 DIAGNOSIS — I129 Hypertensive chronic kidney disease with stage 1 through stage 4 chronic kidney disease, or unspecified chronic kidney disease: Secondary | ICD-10-CM | POA: Insufficient documentation

## 2017-09-09 DIAGNOSIS — M25552 Pain in left hip: Secondary | ICD-10-CM | POA: Insufficient documentation

## 2017-09-09 DIAGNOSIS — M5441 Lumbago with sciatica, right side: Secondary | ICD-10-CM

## 2017-09-09 DIAGNOSIS — M5412 Radiculopathy, cervical region: Secondary | ICD-10-CM | POA: Insufficient documentation

## 2017-09-09 DIAGNOSIS — R634 Abnormal weight loss: Secondary | ICD-10-CM | POA: Diagnosis not present

## 2017-09-09 DIAGNOSIS — K859 Acute pancreatitis without necrosis or infection, unspecified: Secondary | ICD-10-CM | POA: Insufficient documentation

## 2017-09-09 DIAGNOSIS — M47896 Other spondylosis, lumbar region: Secondary | ICD-10-CM | POA: Diagnosis not present

## 2017-09-09 DIAGNOSIS — M533 Sacrococcygeal disorders, not elsewhere classified: Secondary | ICD-10-CM

## 2017-09-09 DIAGNOSIS — M79605 Pain in left leg: Secondary | ICD-10-CM

## 2017-09-09 DIAGNOSIS — G894 Chronic pain syndrome: Secondary | ICD-10-CM | POA: Diagnosis not present

## 2017-09-09 DIAGNOSIS — M4682 Other specified inflammatory spondylopathies, cervical region: Secondary | ICD-10-CM | POA: Insufficient documentation

## 2017-09-09 DIAGNOSIS — M48061 Spinal stenosis, lumbar region without neurogenic claudication: Secondary | ICD-10-CM | POA: Diagnosis not present

## 2017-09-09 DIAGNOSIS — D352 Benign neoplasm of pituitary gland: Secondary | ICD-10-CM | POA: Insufficient documentation

## 2017-09-09 DIAGNOSIS — M4804 Spinal stenosis, thoracic region: Secondary | ICD-10-CM | POA: Insufficient documentation

## 2017-09-09 DIAGNOSIS — M5116 Intervertebral disc disorders with radiculopathy, lumbar region: Secondary | ICD-10-CM | POA: Insufficient documentation

## 2017-09-09 DIAGNOSIS — K861 Other chronic pancreatitis: Secondary | ICD-10-CM | POA: Diagnosis not present

## 2017-09-09 DIAGNOSIS — J449 Chronic obstructive pulmonary disease, unspecified: Secondary | ICD-10-CM | POA: Insufficient documentation

## 2017-09-09 DIAGNOSIS — R1310 Dysphagia, unspecified: Secondary | ICD-10-CM | POA: Insufficient documentation

## 2017-09-09 DIAGNOSIS — E785 Hyperlipidemia, unspecified: Secondary | ICD-10-CM | POA: Diagnosis not present

## 2017-09-09 DIAGNOSIS — K2950 Unspecified chronic gastritis without bleeding: Secondary | ICD-10-CM | POA: Insufficient documentation

## 2017-09-09 DIAGNOSIS — Z86010 Personal history of colonic polyps: Secondary | ICD-10-CM | POA: Insufficient documentation

## 2017-09-09 DIAGNOSIS — M47816 Spondylosis without myelopathy or radiculopathy, lumbar region: Secondary | ICD-10-CM

## 2017-09-09 DIAGNOSIS — N289 Disorder of kidney and ureter, unspecified: Secondary | ICD-10-CM | POA: Insufficient documentation

## 2017-09-09 DIAGNOSIS — R278 Other lack of coordination: Secondary | ICD-10-CM | POA: Diagnosis not present

## 2017-09-09 DIAGNOSIS — Z87440 Personal history of urinary (tract) infections: Secondary | ICD-10-CM | POA: Insufficient documentation

## 2017-09-09 DIAGNOSIS — Z9049 Acquired absence of other specified parts of digestive tract: Secondary | ICD-10-CM | POA: Insufficient documentation

## 2017-09-09 NOTE — Progress Notes (Signed)
Safety precautions to be maintained throughout the outpatient stay will include: orient to surroundings, keep bed in low position, maintain call bell within reach at all times, provide assistance with transfer out of bed and ambulation.  

## 2017-09-09 NOTE — Patient Instructions (Addendum)
________________________________________________________________________________________You have been given pre procedure instructions today.  ____  Preparing for Procedure with Sedation Instructions: . Oral Intake: Do not eat or drink anything for at least 8 hours prior to your procedure. . Transportation: Public transportation is not allowed. Bring an adult driver. The driver must be physically present in our waiting room before any procedure can be started. Marland Kitchen Physical Assistance: Bring an adult physically capable of assisting you, in the event you need help. This adult should keep you company at home for at least 6 hours after the procedure. . Blood Pressure Medicine: Take your blood pressure medicine with a sip of water the morning of the procedure. . Blood thinners:  . Diabetics on insulin: Notify the staff so that you can be scheduled 1st case in the morning. If your diabetes requires high dose insulin, take only  of your normal insulin dose the morning of the procedure and notify the staff that you have done so. . Preventing infections: Shower with an antibacterial soap the morning of your procedure. . Build-up your immune system: Take 1000 mg of Vitamin C with every meal (3 times a day) the day prior to your procedure. Marland Kitchen Antibiotics: Inform the staff if you have a condition or reason that requires you to take antibiotics before dental procedures. . Pregnancy: If you are pregnant, call and cancel the procedure. . Sickness: If you have a cold, fever, or any active infections, call and cancel the procedure. . Arrival: You must be in the facility at least 30 minutes prior to your scheduled procedure. . Children: Do not bring children with you. . Dress appropriately: Bring dark clothing that you would not mind if they get stained. . Valuables: Do not bring any jewelry or valuables. Procedure appointments are reserved for interventional treatments only. Marland Kitchen No Prescription Refills. . No  medication changes will be discussed during procedure appointments. . No disability issues will be discussed. ____________________________________________________________________________________________  Facet Blocks Patient Information  Description: The facets are joints in the spine between the vertebrae.  Like any joints in the body, facets can become irritated and painful.  Arthritis can also effect the facets.  By injecting steroids and local anesthetic in and around these joints, we can temporarily block the nerve supply to them.  Steroids act directly on irritated nerves and tissues to reduce selling and inflammation which often leads to decreased pain.  Facet blocks may be done anywhere along the spine from the neck to the low back depending upon the location of your pain.   After numbing the skin with local anesthetic (like Novocaine), a small needle is passed onto the facet joints under x-ray guidance.  You may experience a sensation of pressure while this is being done.  The entire block usually lasts about 15-25 minutes.   Conditions which may be treated by facet blocks:   Low back/buttock pain  Neck/shoulder pain  Certain types of headaches  Preparation for the injection:  1. Do not eat any solid food or dairy products within 8 hours of your appointment. 2. You may drink clear liquid up to 3 hours before appointment.  Clear liquids include water, black coffee, juice or soda.  No milk or cream please. 3. You may take your regular medication, including pain medications, with a sip of water before your appointment.  Diabetics should hold regular insulin (if taken separately) and take 1/2 normal NPH dose the morning of the procedure.  Carry some sugar containing items with you to your appointment.  4. A driver must accompany you and be prepared to drive you home after your procedure. 5. Bring all your current medications with you. 6. An IV may be inserted and sedation may be given  at the discretion of the physician. 7. A blood pressure cuff, EKG and other monitors will often be applied during the procedure.  Some patients may need to have extra oxygen administered for a short period. 8. You will be asked to provide medical information, including your allergies and medications, prior to the procedure.  We must know immediately if you are taking blood thinners (like Coumadin/Warfarin) or if you are allergic to IV iodine contrast (dye).  We must know if you could possible be pregnant.  Possible side-effects:   Bleeding from needle site  Infection (rare, may require surgery)  Nerve injury (rare)  Numbness & tingling (temporary)  Difficulty urinating (rare, temporary)  Spinal headache (a headache worse with upright posture)  Light-headedness (temporary)  Pain at injection site (serveral days)  Decreased blood pressure (rare, temporary)  Weakness in arm/leg (temporary)  Pressure sensation in back/neck (temporary)   Call if you experience:   Fever/chills associated with headache or increased back/neck pain  Headache worsened by an upright position  New onset, weakness or numbness of an extremity below the injection site  Hives or difficulty breathing (go to the emergency room)  Inflammation or drainage at the injection site(s)  Severe back/neck pain greater than usual  New symptoms which are concerning to you  Please note:  Although the local anesthetic injected can often make your back or neck feel good for several hours after the injection, the pain will likely return. It takes 3-7 days for steroids to work.  You may not notice any pain relief for at least one week.  If effective, we will often do a series of 2-3 injections spaced 3-6 weeks apart to maximally decrease your pain.  After the initial series, you may be a candidate for a more permanent nerve block of the facets.  If you have any questions, please call #[phone]) [facility]

## 2017-09-09 NOTE — Progress Notes (Signed)
Patient's Name: Elaine Clayton  MRN: [id number]  Referring Provider: Idelle Crouch, MD  DOB: [birth year]  PCP: Elaine Crouch, MD  DOS: 09/09/2017  Note by: Elaine Cola, MD  Service setting: Ambulatory outpatient  Specialty: Interventional Pain Management  Location: [facility] (AMB) Pain Management Facility    Patient type: Established   Primary Reason(s) for Visit: Encounter for prescription drug management & post-procedure evaluation of chronic illness with mild to moderate exacerbation(Level of risk: moderate) CC: Back Pain  HPI  [name] is a 71 y.o. year old, female patient, who comes today for a post-procedure evaluation and medication management. She has Breast cancer of upper-outer quadrant of right female breast ([place]); Personal history of malignant neoplasm of breast; Colon polyp; Loss of weight; Pituitary microadenoma ([place]); History of breast cancer in adulthood; Dysmetria; Breast cancer ([place]); Bipolar disorder ([place]); Chronic pancreatitis ([place]); COPD (chronic obstructive pulmonary disease) (HCC); HTN (hypertension); OA (osteoarthritis); Renal insufficiency; Chronic pain syndrome; Chronic shoulder pain (Fourth Area of Pain) (Bilateral) (L>R); Osteoarthritis of shoulder (Bilateral); Chronic neck pain (Tertiary Area of Pain) (Bilateral) (L>R); History of C3-5 ACDF; Numbness and tingling in left upper extremity; Numbness and tingling of right upper extremity; Upper extremity weakness (Bilateral) (L>R); Numbness of upper extremity (Bilateral) (L>R); Numbness and tingling of lower extremity (Bilateral) (L>R); Weakness of lower extremity (Bilateral) (R>L); Chronic abdominal pain (epigastric); Closed fracture of metatarsal bone; 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); 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); Abnormal MRI, cervical spine; 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[place]); Cervical radiculitis; Chronic lumbar radiculopathy (Bilateral) (L>R) (left L5); Chronic low back pain  (Primary Area of Pain) (Bilateral) (L>R); Abnormal MRI, lumbar spine; 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); DDD (degenerative disc disease), cervical; Neurogenic pain; DDD (degenerative disc disease), lumbar; Lumbar foraminal stenosis (T12-S1, multilevel); Lumbar lateral recess stenosis (left L2-3, bilateral L3-4, & right L4-5); Grade 1 Anterolisthesis of L4 over L5 and L5 over S1; Lumbar facet hypertrophy (multilevel); Lumbar Ligamentum flavum hypertrophy (HCC) (multilevel); Dropfoot (Left) (L5 radiculopathy); Menopausal and female climacteric states; Impairment of balance; Lumbar facet joint syndrome (Primary Area of Pain) (Bilateral) (L>R); Chronic sacroiliac joint pain (Bilateral) (L>R); Chronic hip pain (Bilateral) (L>R); Chronic lower extremity pain (Secondary Area of Pain) (Bilateral) (L>R); and Hyperlipidemia on her problem list. Her primarily concern today is the Back Pain  Pain Assessment: Location: Lower Back Radiating: numbness in left leg to knee Onset: More than a month ago Duration: Chronic pain Quality: Numbness, Tingling, Sharp Severity: 2 /10 (self-reported pain score)  Note: Reported level is compatible with observation.                   Timing: Constant Modifying factors: rest  [name] was last seen on 08/11/2017 for a procedure. During today's appointment we reviewed [name]'s post-procedure results, as well as her outpatient medication regimen.  Further details on both, my assessment(s), as well as the proposed treatment plan, please see below.  Controlled Substance Pharmacotherapy Assessment REMS (Risk Evaluation and Mitigation Strategy)  Analgesic: Hydrocodone/APAP 5/325 one to 2 tablets every 6 hours when necessary for pain (40 mg/dayof hydrocodone) (last prescription written on 05/22/2016) Highest recorded MME/day:54m/day MME/day:06mday Elaine ShorterRN  09/09/2017  9:18 AM  Signed Safety precautions to be maintained throughout the  outpatient stay will include: orient to surroundings, keep bed in low position, maintain call bell within reach at all times, provide assistance with transfer out of bed and ambulation.   Pharmacokinetics: Liberation and absorption (onset of action): WNL  Distribution (time to peak effect): WNL Metabolism and excretion (duration of action): WNL         Pharmacodynamics: Desired effects: Analgesia: [name] reports >50% benefit. Functional ability: Patient reports that medication allows her to accomplish basic ADLs Clinically meaningful improvement in function (CMIF): Sustained CMIF goals met Perceived effectiveness: Described as relatively effective, allowing for increase in activities of daily living (ADL) Undesirable effects: Side-effects or Adverse reactions: None reported Monitoring: [employer] PMP: Online review of the past 56-monthperiod conducted. Compliant with practice rules and regulations List of all UDS test(s) done:  No results found for: TOXASSSELUR, SUMMARY Last UDS on record: No results found for: TOXASSSELUR, SUMMARY UDS interpretation: Compliant          Medication Assessment Form: Reviewed. Patient indicates being compliant with therapy Treatment compliance: Compliant Risk Assessment Profile: Aberrant behavior: See prior evaluations. None observed or detected today Comorbid factors increasing risk of overdose: See prior notes. No additional risks detected today Risk of substance use disorder (SUD): Low     Opioid Risk Tool - 09/09/17 0914      Family History of Substance Abuse   Alcohol Negative   Illegal Drugs Negative   Rx Drugs Negative     Personal History of Substance Abuse   Alcohol Negative   Illegal Drugs Negative   Rx Drugs Negative     Age   Age between 143-45years  No     History of Preadolescent Sexual Abuse   History of Preadolescent Sexual Abuse Negative or Female     Psychological Disease   Psychological Disease Negative   Depression  Negative     Total Score   Opioid Risk Tool Scoring 0   Opioid Risk Interpretation Low Risk     ORT Scoring interpretation table:  Score <3 = Low Risk for SUD  Score between 4-7 = Moderate Risk for SUD  Score >8 = High Risk for Opioid Abuse   Risk Mitigation Strategies:  Patient Counseling: Covered Patient-Prescriber Agreement (PPA): Present and active  Notification to other healthcare providers: Done  Pharmacologic Plan: No change in therapy, at this time  Post-Procedure Assessment  08/11/2017 Procedure: Diagnostic bilateral lumbar facet block + bilateral sacroiliac joint block under fluoroscopic guidance and IV sedation Pre-procedure pain score:  4/10 Post-procedure pain score: 0/10 (100% relief) Influential Factors: BMI: 16.97 kg/m Intra-procedural challenges: None observed.         Assessment challenges: None detected.              Reported side-effects: None.        Post-procedural adverse reactions or complications: None reported         Sedation: Sedation provided. When no sedatives are used, the analgesic levels obtained are directly associated to the effectiveness of the local anesthetics. However, when sedation is provided, the level of analgesia obtained during the initial 1 hour following the intervention, is believed to be the result of a combination of factors. These factors may include, but are not limited to: 1. The effectiveness of the local anesthetics used. 2. The effects of the analgesic(s) and/or anxiolytic(s) used. 3. The degree of discomfort experienced by the patient at the time of the procedure. 4. The patients ability and reliability in recalling and recording the events. 5. The presence and influence of possible secondary gains and/or psychosocial factors. Reported result: Relief experienced during the 1st hour after the procedure: 100 % (Ultra-Short Term Relief)  Interpretative annotation: Clinically appropriate result. Analgesia during this  period is likely to be Local Anesthetic and/or IV Sedative (Analgesic/Anxiolytic) related.          Effects of local anesthetic: The analgesic effects attained during this period are directly associated to the localized infiltration of local anesthetics and therefore cary significant diagnostic value as to the etiological location, or anatomical origin, of the pain. Expected duration of relief is directly dependent on the pharmacodynamics of the local anesthetic used. Long-acting (4-6 hours) anesthetics used.  Reported result: Relief during the next 4 to 6 hour after the procedure: 100 % (Short-Term Relief)            Interpretative annotation: Clinically appropriate result. Analgesia during this period is likely to be Local Anesthetic-related.          Long-term benefit: Defined as the period of time past the expected duration of local anesthetics (1 hour for short-acting and 4-6 hours for long-acting). With the possible exception of prolonged sympathetic blockade from the local anesthetics, benefits during this period are typically attributed to, or associated with, other factors such as analgesic sensory neuropraxia, antiinflammatory effects, or beneficial biochemical changes provided by agents other than the local anesthetics.  Reported result: Extended relief following procedure: 75 % (lasting 3 weeks) (Long-Term Relief)            Interpretative annotation: Clinically appropriate result. Good relief. No permanent benefit expected. Inflammation plays a part in the etiology to the pain.          Current benefits: Defined as persistent relief that continues at this point in time.   Reported results: Treated area: <25 %       Interpretative annotation: Recurrence of symptoms. No permanent benefit expected. Effective diagnostic intervention.          Interpretation: Results would suggest a successful diagnostic intervention.                  Plan:  Proceed with diagnostic procedure #  2.  Laboratory Chemistry  Inflammation Markers (CRP: Acute Phase) (ESR: Chronic Phase) Lab Results  Component Value Date   CRP <0.8 03/19/2017   ESRSEDRATE 4 03/19/2017                 Renal Function Markers Lab Results  Component Value Date   BUN 12 03/19/2017   CREATININE 0.99 03/19/2017   GFRAA >60 03/19/2017   GFRNONAA 57 (L) 03/19/2017                 Hepatic Function Markers Lab Results  Component Value Date   AST 22 03/19/2017   ALT 14 03/19/2017   ALBUMIN 4.5 03/19/2017   ALKPHOS 53 03/19/2017                 Electrolytes Lab Results  Component Value Date   NA 139 03/19/2017   K 4.0 03/19/2017   CL 107 03/19/2017   CALCIUM 9.5 03/19/2017   MG 2.1 03/19/2017                 Neuropathy Markers Lab Results  Component Value Date   VITAMINB12 558 03/19/2017                 Bone Pathology Markers Lab Results  Component Value Date   ALKPHOS 53 03/19/2017   25OHVITD1 57 03/19/2017   25OHVITD2 1.2 03/19/2017   25OHVITD3 56 03/19/2017   CALCIUM 9.5 03/19/2017  Coagulation Parameters Lab Results  Component Value Date   PLT 206 06/25/2015                 Cardiovascular Markers Lab Results  Component Value Date   HGB 10.0 (L) 06/25/2015   HCT 31.1 (L) 06/25/2015                 Note: Lab results reviewed.  Recent Diagnostic Imaging Review  Dg C-arm 1-60 Min-no Report  Result Date: 08/11/2017 Fluoroscopy was utilized by the requesting physician.  No radiographic interpretation.   Note: Imaging results reviewed.          Meds   Current Outpatient Prescriptions:  .  ALPRAZolam (XANAX) 0.5 MG tablet, Take 0.5 mg by mouth 2 (two) times daily as needed for sleep. 0.5-1 tablet, Disp: , Rfl:  .  aspirin EC 81 MG tablet, Take 1 tablet (81 mg total) by mouth daily., Disp: 90 tablet, Rfl:  .  AZOPT 1 % ophthalmic suspension, Place 1 drop into the left eye. , Disp: , Rfl: 0 .  bisacodyl (DULCOLAX) 5 MG EC tablet, Take 5 mg by mouth  daily as needed for moderate constipation., Disp: , Rfl:  .  Brimonidine Tartrate-Timolol (COMBIGAN OP), Apply 1 drop to eye 2 (two) times daily., Disp: , Rfl:  .  buPROPion (WELLBUTRIN SR) 150 MG 12 hr tablet, Take 150 mg by mouth every morning. 2 tablets, Disp: , Rfl:  .  dextromethorphan 15 MG/5ML syrup, Take 10 mLs by mouth 4 (four) times daily as needed for cough., Disp: , Rfl:  .  escitalopram (LEXAPRO) 10 MG tablet, Take 10 mg by mouth daily. , Disp: , Rfl: 0 .  esomeprazole (NEXIUM) 40 MG capsule, Take 40 mg by mouth. , Disp: , Rfl:  .  estradiol-levonorgestrel (CLIMARA PRO) 0.045-0.015 MG/DAY, apply 1 patch every week, Disp: 12 patch, Rfl: 3 .  fluticasone furoate-vilanterol (BREO ELLIPTA) 100-25 MCG/INH AEPB, Inhale 1 puff into the lungs daily., Disp: 3 each, Rfl: 3 .  HYDROcodone-acetaminophen (NORCO/VICODIN) 5-325 MG tablet, Take 1 tablet by mouth every 6 (six) hours as needed for moderate pain., Disp: 60 tablet, Rfl: 0 .  ipratropium (ATROVENT) 0.06 % nasal spray, Place 2 sprays into both nostrils 4 (four) times daily as needed for rhinitis., Disp: , Rfl:  .  Lido-Capsaicin-Men-Methyl Sal (1ST MEDX-PATCH/ LIDOCAINE) 4-0.025-5-20 % PTCH, Apply topically as needed., Disp: , Rfl:  .  losartan (COZAAR) 25 MG tablet, Take 25 mg by mouth daily., Disp: , Rfl:  .  LUMIGAN 0.01 % SOLN, apply 1 drop to into both  eyes at bedtime once daily ONLY, Disp: , Rfl: 0 .  mirabegron ER (MYRBETRIQ) 25 MG TB24 tablet, Take 1 tablet (25 mg total) by mouth daily., Disp: 30 tablet, Rfl: 11 .  nicotine polacrilex (NICORETTE) 2 MG gum, Take 2 mg by mouth as needed for smoking cessation., Disp: , Rfl:  .  ondansetron (ZOFRAN) 4 MG tablet, , Disp: , Rfl: 0 .  ondansetron (ZOFRAN) 8 MG tablet, Take by mouth every 8 (eight) hours as needed for nausea or vomiting., Disp: , Rfl:  .  ranitidine (ZANTAC) 150 MG tablet, Take 150 mg by mouth daily., Disp: , Rfl:  .  rosuvastatin (CRESTOR) 10 MG tablet, Take 10 mg by  mouth at bedtime. , Disp: , Rfl:  .  VENTOLIN HFA 108 (90 Base) MCG/ACT inhaler, inhale 2 puffs by mouth INTO THE LUNGS every 6 hours if needed for wheezing or shortness of  breath, Disp: 18 g, Rfl: 6 .  gabapentin (NEURONTIN) 100 MG capsule, Take 1-3 capsules (100-300 mg total) by mouth at bedtime., Disp: 90 capsule, Rfl: 0  ROS  Constitutional: Denies any fever or chills Gastrointestinal: No reported hemesis, hematochezia, vomiting, or acute GI distress Musculoskeletal: Denies any acute onset joint swelling, redness, loss of ROM, or weakness Neurological: No reported episodes of acute onset apraxia, aphasia, dysarthria, agnosia, amnesia, paralysis, loss of coordination, or loss of consciousness  Allergies  Ms. Calaway is allergic to influenza vaccines; ciprofloxacin; flu virus vaccine; sulfa antibiotics; and tetracyclines & related.  [place]  Drug: Ms. Stitzer  reports that she does not use drugs. Alcohol:  reports that she drinks alcohol. Tobacco:  reports that she quit smoking about 9 years ago. She has a 35.00 pack-year smoking history. She has never used smokeless tobacco. Medical:  has a past medical history of Anemia; Bipolar affective disorder ([place]); Broken foot (2015); Chronic gastritis; Chronic kidney disease; COPD (chronic obstructive pulmonary disease) ([place]); Dizziness; Dysphagia; Glaucoma; Hyperlipemia; Hypertension; Lithium toxicity (2015); Malignant neoplasm of upper-outer quadrant of female breast [facility]) (May 07, 2007); Neck stiffness; Osteoarthritis; Pancreatitis; Personal history of malignant neoplasm of breast; Pituitary microadenoma ([place]); Recurrent UTI; Renal insufficiency; and Stomach ulcer (2112). Surgical: Ms. Jaso  has a past surgical history that includes Colonoscopy (2004); Neck surgery (2006); Eye surgery (2013); Appendectomy; Tubal ligation; Colonoscopy (N/A, 05/14/2015); Esophagogastroduodenoscopy (N/A, 05/14/2015); Savory dilation (N/A, 05/14/2015); ERCP (N/A); Glaucoma  surgery (Right, 2013); cataract (Right, 2013); Breast surgery (Left,  2013); Breast surgery (Right, November 06, 1999); Breast surgery (Left, May 07, 2007); EUS (N/A, 2011, 2012); Laparoscopic right colectomy (Right, 06/22/2015); right breast cancer; Trabeculectomy (Left, 03/05/2016); Cataract extraction w/PHACO (Left, 03/05/2016); Breast biopsy (Right, 2011); Breast excisional biopsy (Right, 2001); Breast excisional biopsy (Right, 2008); and Breast excisional biopsy (Left, 10/26/2012). Family: family history includes Breast cancer (age of onset: 46) in her mother.  Constitutional Exam  General appearance: Well nourished, well developed, and well hydrated. In no apparent acute distress Vitals:   09/09/17 0909  BP: 130/77  Pulse: 67  Resp: 15  Temp: 98 F (36.7 C)  SpO2: 98%  Weight: 102 lb (46.3 kg)  Height: _0  (1.651 m)   BMI Assessment: Estimated body mass index is 16.97 kg/m as calculated from the following:   Height as of this encounter: _1  (1.651 m).   Weight as of this encounter: 102 lb (46.3 kg).  BMI interpretation table: BMI level Category Range association with higher incidence of chronic pain  <18 kg/m2 Underweight   18.5-24.9 kg/m2 Ideal body weight   25-29.9 kg/m2 Overweight Increased incidence by 20%  30-34.9 kg/m2 Obese (Class I) Increased incidence by 68%  35-39.9 kg/m2 Severe obesity (Class II) Increased incidence by 136%  >40 kg/m2 Extreme obesity (Class III) Increased incidence by 254%   BMI Readings from Last 4 Encounters:  09/09/17 16.97 kg/m  08/11/17 16.97 kg/m  07/29/17 17.14 kg/m  07/24/17 17.47 kg/m   Wt Readings from Last 4 Encounters:  09/09/17 102 lb (46.3 kg)  08/11/17 102 lb (46.3 kg)  07/29/17 103 lb (46.7 kg)  07/24/17 105 lb (47.6 kg)  Psych/Mental status: Alert, oriented x 3 (person, place, & time)       Eyes: PERLA Respiratory: No evidence of acute respiratory distress  Cervical Spine Area Exam  Skin & Axial Inspection: No masses,  redness, edema, swelling, or associated skin lesions Alignment: Symmetrical Functional ROM: Unrestricted ROM      Stability: No instability  detected Muscle Tone/Strength: Functionally intact. No obvious neuro-muscular anomalies detected. Sensory (Neurological): Unimpaired Palpation: No palpable anomalies              Upper Extremity (UE) Exam    Side: Right upper extremity  Side: Left upper extremity  Skin & Extremity Inspection: Skin color, temperature, and hair growth are WNL. No peripheral edema or cyanosis. No masses, redness, swelling, asymmetry, or associated skin lesions. No contractures.  Skin & Extremity Inspection: Skin color, temperature, and hair growth are WNL. No peripheral edema or cyanosis. No masses, redness, swelling, asymmetry, or associated skin lesions. No contractures.  Functional ROM: Unrestricted ROM          Functional ROM: Unrestricted ROM          Muscle Tone/Strength: Functionally intact. No obvious neuro-muscular anomalies detected.  Muscle Tone/Strength: Functionally intact. No obvious neuro-muscular anomalies detected.  Sensory (Neurological): Unimpaired          Sensory (Neurological): Unimpaired          Palpation: No palpable anomalies              Palpation: No palpable anomalies              Specialized Test(s): Deferred         Specialized Test(s): Deferred          Thoracic Spine Area Exam  Skin & Axial Inspection: No masses, redness, or swelling Alignment: Symmetrical Functional ROM: Unrestricted ROM Stability: No instability detected Muscle Tone/Strength: Functionally intact. No obvious neuro-muscular anomalies detected. Sensory (Neurological): Unimpaired Muscle strength & Tone: No palpable anomalies  Lumbar Spine Area Exam  Skin & Axial Inspection: No masses, redness, or swelling Alignment: Symmetrical Functional ROM: Decreased ROM      Stability: No instability detected Muscle Tone/Strength: Functionally intact. No obvious neuro-muscular  anomalies detected. Sensory (Neurological): Movement-associated pain Palpation: Complains of area being tender to palpation       Provocative Tests: Lumbar Hyperextension and rotation test: Positive bilaterally for facet joint pain. Lumbar Lateral bending test: evaluation deferred today       Patrick's Maneuver: Positive for bilateral S-I arthralgia              Gait & Posture Assessment  Ambulation: Unassisted Gait: Relatively normal for age and body habitus Posture: WNL   Lower Extremity Exam    Side: Right lower extremity  Side: Left lower extremity  Skin & Extremity Inspection: Skin color, temperature, and hair growth are WNL. No peripheral edema or cyanosis. No masses, redness, swelling, asymmetry, or associated skin lesions. No contractures.  Skin & Extremity Inspection: Skin color, temperature, and hair growth are WNL. No peripheral edema or cyanosis. No masses, redness, swelling, asymmetry, or associated skin lesions. No contractures.  Functional ROM: Unrestricted ROM          Functional ROM: Unrestricted ROM          Muscle Tone/Strength: Functionally intact. No obvious neuro-muscular anomalies detected.  Muscle Tone/Strength: Functionally intact. No obvious neuro-muscular anomalies detected.  Sensory (Neurological): Unimpaired  Sensory (Neurological): Unimpaired  Palpation: No palpable anomalies  Palpation: No palpable anomalies   Assessment  Primary Diagnosis & Pertinent Problem List: The primary encounter diagnosis was Chronic low back pain (Primary Area of Pain) (Bilateral) (L>R). Diagnoses of Chronic lower extremity pain (Secondary Area of Pain) (Bilateral) (L>R), Chronic lumbar radiculopathy (Bilateral) (L>R) (left L5), Chronic pain syndrome, Chronic sacroiliac joint pain (Bilateral) (L>R), Lumbar facet joint syndrome (Primary Area of Pain) (Bilateral) (L>R), and Lumbar  facet hypertrophy (multilevel) were also pertinent to this visit.  Status Diagnosis   Persistent Persistent Persistent 1. Chronic low back pain (Primary Area of Pain) (Bilateral) (L>R)   2. Chronic lower extremity pain (Secondary Area of Pain) (Bilateral) (L>R)   3. Chronic lumbar radiculopathy (Bilateral) (L>R) (left L5)   4. Chronic pain syndrome   5. Chronic sacroiliac joint pain (Bilateral) (L>R)   6. Lumbar facet joint syndrome (Primary Area of Pain) (Bilateral) (L>R)   7. 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  New Prescriptions   No medications on file   Medications administered today: Ms. Crownover had no medications administered during this visit.   Procedure Orders     LUMBAR FACET(MEDIAL BRANCH NERVE BLOCK) MBNB     SACROILIAC JOINT INJECTINS  Lab Orders  No laboratory test(s) ordered today   Imaging Orders  No imaging studies ordered today   Referral Orders  No referral(s) requested today    Interventional management options: Planned, scheduled, and/or pending:   Diagnostic bilateral lumbar facet block + bilateral sacroiliac joint block #2   Considering:   Diagnostic bilateral lumbar facet block Possible bilateral lumbar facet RFA  Diagnostic bilateral sacroiliac joint block  Possible bilateral sacroiliac joint  RFA Diagnostic left cervical epidural steroid injection Diagnostic left L4-5 interlaminar lumbar epidural steroid injection Diagnostic left L5-S1 transforaminal epidural steroid injection    Palliative PRN treatment(s):   Palliative left cervical epidural steroid injection#2 Palliativeleft L4-5 interlaminar lumbar epidural steroid injection + left L5-S1 transforaminal #3   Provider-requested follow-up: Return for Procedure (with sedation): (B) L-FCT + SI BLK.  Future Appointments Date Time Provider [place]  09/15/2017 11:15 AM Hillis Range, PT [facility] None  09/17/2017 1:00 PM Hillis Range, PT [facility] None  09/21/2017 4:00 PM Hillis Range, PT [facility] None  09/23/2017 9:00 AM Hollice Espy, MD [facility] None  09/23/2017 4:15 PM Hillis Range, PT [facility] None  09/29/2017 9:00 AM Hillis Range, PT [facility] None  10/06/2017 2:30 PM Hillis Range, PT [facility] None  10/08/2017 3:15 PM Hillis Range, PT [facility] None  10/13/2017 2:45 PM Hillis Range, PT [facility] None  10/20/2017 1:00 PM Hillis Range, PT [facility] None  10/22/2017 2:30 PM Hillis Range, PT [facility] None  02/10/2018 1:30 PM Dohmeier, Asencion Partridge, MD [facility] None   Primary Care Physician: Elaine Crouch, MD Location: [facility] Note by: Elaine Cola, MD Date: 09/09/2017; Time: 10:20 AM

## 2017-09-10 ENCOUNTER — Ambulatory Visit: Payer: Medicare Other | Admitting: Physical Therapy

## 2017-09-10 ENCOUNTER — Encounter: Payer: Self-pay | Admitting: Physical Therapy

## 2017-09-10 DIAGNOSIS — M6281 Muscle weakness (generalized): Secondary | ICD-10-CM | POA: Diagnosis not present

## 2017-09-10 DIAGNOSIS — R2689 Other abnormalities of gait and mobility: Secondary | ICD-10-CM

## 2017-09-10 DIAGNOSIS — R278 Other lack of coordination: Secondary | ICD-10-CM

## 2017-09-10 NOTE — Therapy (Addendum)
[place] [facility] SERVICES [address] [place], [place], [address] Phone: [phone]   Fax:  [phone]  Physical Therapy Treatment  Patient Details  Name: Elaine Clayton MRN: [id number] Date of Birth: 1946-03-21 Referring Provider: Dr. Joanna Hews   Encounter Date: 09/10/2017      PT End of Session - 09/10/17 1309    Visit Number 9   Number of Visits 17   Date for PT Re-Evaluation 09/29/17   Authorization Type G-Code 9   PT Start Time 1301   PT Stop Time 1345   PT Time Calculation (min) 44 min   Equipment Utilized During Treatment Gait belt   Activity Tolerance Patient limited by pain;Patient tolerated treatment well   Behavior During Therapy [facility] for tasks assessed/performed      Past Medical History:  Diagnosis Date  . Anemia   . Bipolar affective disorder ([place])    2  . Broken foot 2015   right foot  . Chronic gastritis   . Chronic kidney disease    Dr Holley Raring  . COPD (chronic obstructive pulmonary disease) ([place])   . Dizziness    inner ear (per pt) several times per week  . Dysphagia   . Glaucoma   . Hyperlipemia   . Hypertension    pt has recently come off BP meds.  MD aware. BP seems stable.  . Lithium toxicity 2015  . 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 Neck stiffness    s/p C3-C4 fusion, limited right turn  . Osteoarthritis    back  . Pancreatitis   . Personal history of malignant neoplasm of breast   . Pituitary microadenoma ([place])   . Recurrent UTI   . Renal insufficiency   . Stomach ulcer 2112    Past Surgical History:  Procedure Laterality Date  . APPENDECTOMY    . BREAST BIOPSY Right 2011   neg  . BREAST EXCISIONAL BIOPSY Right 2001   neg  . BREAST EXCISIONAL BIOPSY Right 2008   breast ca 2008 radiation  . BREAST EXCISIONAL BIOPSY Left 10/26/2012   neg  . BREAST SURGERY Left  2013   left breast wide  excision,Intraductal papilloma, ductal hyperplasia and sclerosing adenosis. Microcalcifications associated with columnar cell change. No evidence of atypia or malignancy. Margins are unremarkable.  Marland Kitchen BREAST SURGERY Right November 06, 1999   multiple areas of microcalcification showing evidence of sclerosing adenosis and ductal hyperplasia.  Marland Kitchen BREAST SURGERY Left May 07, 2007   wide excision.  . cataract Right 2013  . CATARACT EXTRACTION W/PHACO Left 03/05/2016   Procedure: CATARACT EXTRACTION PHACO AND INTRAOCULAR LENS PLACEMENT (IOC);  Surgeon: Leandrew Koyanagi, MD;  Location: [place];  Service: Ophthalmology;  Laterality: Left;  . COLONOSCOPY  2004  . COLONOSCOPY N/A 05/14/2015   Procedure: COLONOSCOPY;  Surgeon: Manya Silvas, MD;  Location: [facility] ENDOSCOPY;  Service: Endoscopy;  Laterality: N/A;  . ERCP N/A   . ESOPHAGOGASTRODUODENOSCOPY N/A 05/14/2015   Procedure: ESOPHAGOGASTRODUODENOSCOPY (EGD);  Surgeon: Manya Silvas, MD;  Location: [facility] ENDOSCOPY;  Service: Endoscopy;  Laterality: N/A;  . EUS N/A 2011, 2012  . EYE SURGERY  2013  . GLAUCOMA SURGERY Right 2013  . LAPAROSCOPIC RIGHT COLECTOMY Right 06/22/2015   Procedure: LAPAROSCOPIC RIGHT COLECTOMY;  Surgeon: Robert Bellow, MD;  Location: [facility];  Service: General;  Laterality: Right;  . NECK SURGERY  2006  .  right breast cancer    . SAVORY DILATION N/A 05/14/2015   Procedure: SAVORY DILATION;  Surgeon: Manya Silvas, MD;  Location: [facility] END[facility]COPY;  Service: Endoscopy;  Laterality: N/A;  . TRABECULECTOMY Left 03/05/2016   Procedure: TRABECULECTOMY WITH [facility]UNT;  Surgeon: Leandrew Koyanagi, MD;  Location: [place];  Service: Ophthalmology;  Laterality: Left;  . TUBAL LIGATION      There were no vitals filed for this visit.      Subjective Assessment - 09/10/17 1308    Subjective Patient reports increased soreness in back today. she states, "I can tell when I do too much, my  back starts to hurt." She reports decreased activity tolerance with soreness. She reports taking pain meds prior to session;    Pertinent History 71 yo Female with recent increase in back pain with subsequent numbness/tingling , weakness, and drop foot in B LEs, L>R starting about 3 months ago. She has had increased instability in gait and falls recently with minor injuries. Pt had recent epidural steroid injection L4-5, and L5-S1 with decrease in back pain since.Pt with extensive surgical and medical Hx including breast cancer (last surgery 2013), DJD with cervical fusion C3-5 in 2006, bipolar disorder, HTN, OA in bilateral shoulders L>R, hip and SIJ pain x >20 years.    Limitations Lifting;Standing;Walking;House hold activities   How long can you sit comfortably? Unlimited   How long can you stand comfortably? 20-30 minutes before balance gets worse   How long can you walk comfortably? 40' approx before drop foot or pain starts.    Diagnostic tests MRI 05/20/17: Chronic lumbar scoliosis with mild grade 1 spondylolisthesis from L3 L4 to L5-S1. Advanced disc and facet degeneration at those levels. Mild to moderate spinal and foraminal stenosis at L3-L4 and L4-L5. Right L5 radiculitis. Moderate neural foraminal stenosis also at the left L1 and both L2 nerve levels.   Patient Stated Goals To stop falling   Currently in Pain? Yes   Pain Score 4    Pain Location Back   Pain Orientation Lower   Pain Descriptors / Indicators Tightness;Other (Comment)  grabbing   Pain Type Chronic pain   Pain Onset More than a month ago   Pain Frequency Intermittent   Aggravating Factors  standing/walking   Pain Relieving Factors rest   Effect of Pain on Daily Activities decreased activity tolerance;         TREATMENT: Warm up on Nustep BUE/BLE level 2 x5 min (unbilled);  Seated green tband ankle DF 2x15 bilaterally with cues for positioning to isolate ankle strengthening; Seated green tband hip flexion march  x15 with cues to keep back against chair for better tolerance;  PT identified 2 cm leg length discrepancy (L: 90 cm, R: 88 cm); provided small heel insert in right shoe; educated patient in safe wear with instruction to start for a few hours each day; will re-assess next week its effectiveness at reducing imbalance; Patient also instructed to remove insert if it increases back pain;   Instructed patient in balance exercise: Standing on airex x2: Heel raises unsupported x10 with min A for safety and cues for weight shift and to keep knees straight for better ankle activation; Mini squat with arms reaching forward (1 set with 2# bar chest press), unsupported, min A 3 sec hold 2x5 reps Feet slightly apart, BUE ball pass x10 each direction with CGA for safety and cues to increase reach outside base of support for better  Balance challenge;  Standing on 1/2 bolster (flat side up) Tandem stance with BUE wand flexion 2# x5 each foot in front Tandem stance with mini squat x5 reps each foot in front; Patient requires CGA to min A for safety; Patient required min VCs for balance stability, including to increase trunk control for less loss of balance with smaller base of support    Tolerated session well; Denies any pain at end of session;                           PT Education - 09/10/17 1309    Education provided Yes   Education Details balance/strengthening, HEP reinforced;    Person(s) Educated Patient   Methods Explanation;Demonstration;Verbal cues   Comprehension Verbalized understanding;Returned demonstration;Verbal cues required;Need further instruction          PT Short Term Goals - 09/03/17 1312      PT SHORT TERM GOAL #1   Title Patient will deny any falls over past 4 weeks to demonstrate improved safety awareness at home and work.    Baseline Pt had a fall yesterday without injury, and multiple falls since last goal assessment   Time 4   Period Weeks    Status On-going   Target Date 09/02/17     PT SHORT TERM GOAL #2   Title Pt will obtain and appropriately use LRAD (Rollator suggested) in order to decraese fall risk and increase community access.    Baseline Pt has obtained Rollator for use outside   Time 2   Period Weeks   Status Achieved   Target Date 08/19/17           PT Long Term Goals - 09/03/17 1315      PT LONG TERM GOAL #1   Title Patient will be independent in home exercise program to improve strength/mobility for better functional independence with ADLs.   Baseline Pt does part of her HEP about 3-4 times per week.   Time 8   Period Weeks   Status Partially Met   Target Date 09/29/17     PT LONG TERM GOAL #2   Title Patient will increase Berg Balance score by > 6 points to demonstrate decreased fall risk during functional activities.; Deferred as patient's balance is more impaired dynamically and therefore FGA goal more appropriate;    Baseline will defer at this time;    Time 8   Period Weeks   Status Deferred   Target Date 09/29/17     PT LONG TERM GOAL #3   Title Patient (> 33 years old) will complete five times sit to stand test in < 15 seconds indicating an increased LE strength and improved balance.   Baseline 22 sec indicating pt is high fall risk for her age      9/13: 23 sec with BUE assist indicating pt is high fall risk for her age      Time 5   Period Weeks   Status On-going   Target Date 09/29/17     PT LONG TERM GOAL #4   Title Patient will ascend/descend 4 stairs without rail assist independently without loss of balance to improve ability to get in/out of home.    Baseline Unsafe on stairs without UE assist   Time 8   Period Weeks   Status On-going   Target Date 09/29/17     PT LONG TERM GOAL #5   Title Patient will increase BLE gross strength to  4/5 as to improve functional strength for independent gait, increased standing tolerance and increased ADL ability.   Baseline 2- to 4-/5  grossly; 09/03/17 grossly 4-/5 in BLEs   Time 8   Period Weeks   Status Partially Met   Target Date 09/29/17     PT LONG TERM GOAL #6   Title Patient will increase Functional Gait Assessment score to >20/30 as to reduce fall risk and improve dynamic gait safety with community ambulation.   Baseline 16/30 indicating pt is a high fall risk on 09/03/17   Time 8   Period Weeks   Status New   Target Date 09/29/17               Plan - 09/10/17 1310    Clinical Impression Statement Patient instructed in advanced balance exercise. she does require cues for better trunk control and weight shift for better dynamic/static stance control. Patient does demonstrate better ankle stability when on uneven surface. She was also instructed in advanced LE strengthening exercise. PT identified increased uneven leg length (R: 88 cm, L: 90 cm); PT provided patient with small heel insert for right shoe to correct hip height. Instructed patient on safe wear. Will re-assess next session to see if heel insert helps with balace. Patient would benefit from additional skilled PT intervention to improve strength, balance and gait safety;    Rehab Potential Fair   Clinical Impairments Affecting Rehab Potential Pt does not use AD and had decreased knowledge of her limitations   PT Frequency 2x / week   PT Duration 8 weeks   PT Treatment/Interventions ADLs/Self Care Home Management;Aquatic Therapy;Biofeedback;Canalith Repostioning;Cryotherapy;Moist Heat;Cognitive remediation;Neuromuscular re-education;Balance training;Therapeutic exercise;Therapeutic activities;Functional mobility training;Stair training;Gait training;DME Instruction;Patient/family education;Orthotic Fit/Training;Manual techniques;Passive range of motion;Visual/perceptual remediation/compensation;Vestibular;Energy conservation;Dry needling   PT Next Visit Plan assess heel insert and see if its helping;    PT Home Exercise Plan continue as given; provided  heel insert   Consulted and Agree with Plan of Care Patient      Patient will benefit from skilled therapeutic intervention in order to improve the following deficits and impairments:  Abnormal gait, Decreased activity tolerance, Decreased balance, Decreased cognition, Decreased coordination, Decreased safety awareness, Decreased mobility, Decreased knowledge of use of DME, Decreased knowledge of precautions, Decreased endurance, Decreased skin integrity, Decreased strength, Difficulty walking, Dizziness, Postural dysfunction, Improper body mechanics, Impaired vision/preception, Impaired UE functional use, Impaired sensation, Hypomobility, Decreased range of motion, Impaired flexibility, Pain  Visit Diagnosis: Muscle weakness (generalized)  Other lack of coordination  Other abnormalities of gait and mobility     Problem List Patient Active Problem List   Diagnosis Date Noted  . Lumbar facet joint syndrome (Primary Area of Pain) (Bilateral) (L>R) 07/29/2017  . Chronic sacroiliac joint pain (Bilateral) (L>R) 07/29/2017  . Chronic hip pain (Bilateral) (L>R) 07/29/2017  . Chronic lower extremity pain (Secondary Area of Pain) (Bilateral) (L>R) 07/29/2017  . Impairment of balance 07/07/2017  . Menopausal and female climacteric states 06/15/2017  . Lumbar foraminal stenosis (T12-S1, multilevel) 06/10/2017  . Lumbar lateral recess stenosis (left L2-3, bilateral L3-4, & right L4-5) 06/10/2017  . Grade 1 Anterolisthesis of L4 over L5 and L5 over S1 06/10/2017  . Lumbar facet hypertrophy (multilevel) 06/10/2017  . Lumbar Ligamentum flavum hypertrophy (HCC) (multilevel) 06/10/2017  . Dropfoot (Left) (L5 radiculopathy) 06/10/2017  . Neurogenic pain 06/09/2017  . DDD (degenerative disc disease), lumbar 06/09/2017  . 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 Closed fracture of metatarsal bone 05/06/2017  .  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 Abnormal MRI, cervical spine 05/06/2017  . 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[place]) 05/06/2017  . Cervical radiculitis 05/06/2017  . Chronic lumbar radiculopathy (Bilateral) (L>R) (left L5) 05/06/2017  . Chronic low back pain (Primary Area of Pain) (Bilateral) (L>R) 05/06/2017  . Abnormal MRI, lumbar spine 05/06/2017  . Chronic pain syndrome 03/19/2017  . Chronic shoulder pain (Fourth Area of Pain) (Bilateral) (L>R) 03/19/2017  . Osteoarthritis of shoulder (Bilateral) 03/19/2017  . Chronic neck pain [facility] Area of Pain) (Bilateral) (L>R) 03/19/2017  . History of C3-5 ACDF 03/19/2017  . Numbness and tingling in left upper extremity 03/19/2017  . Numbness and tingling of right upper extremity 03/19/2017  . Upper extremity weakness (Bilateral) (L>R) 03/19/2017  . Numbness of upper extremity (Bilateral) (L>R) 03/19/2017  . Numbness and tingling of lower extremity (Bilateral) (L>R) 03/19/2017  . Weakness of lower extremity (Bilateral) (R>L) 03/19/2017  . Chronic abdominal pain (epigastric) 03/19/2017  . Cervical syndrome 03/19/2017  . Cervical spine disease 12/08/2016  . Pituitary microadenoma ([place]) 12/08/2016  . History of breast cancer in adulthood 12/08/2016  . Dysmetria 12/08/2016  . Loss of weight 08/15/2015  . Colon polyp 05/25/2015  . Personal history of malignant neoplasm of breast 07/18/2014  . Breast cancer ([place]) 06/28/2014  . Bipolar disorder ([place]) 06/28/2014  . Chronic pancreatitis ([place]) 06/28/2014  . HTN (hypertension) 06/28/2014  . OA (osteoarthritis) 06/28/2014  . Renal insufficiency 06/28/2014  . DDD (degenerative disc disease), cervical 06/28/2014  . Hyperlipidemia 06/28/2014  . COPD (chronic obstructive pulmonary disease) ([place]) 05/17/2014  . Breast cancer of upper-outer quadrant of right female breast ([place]) 05/31/2008    Lachrisha Ziebarth PT, DPT 09/10/2017, 1:56 PM  [place] [facility] SERVICES [place], [place], [address] Phone: [phone]   Fax:  [phone]  Name: Elaine Clayton MRN: [id number] Date of Birth: 09-20-1946

## 2017-09-15 ENCOUNTER — Encounter: Payer: Self-pay | Admitting: Physical Therapy

## 2017-09-15 ENCOUNTER — Ambulatory Visit: Payer: Medicare Other | Admitting: Physical Therapy

## 2017-09-15 DIAGNOSIS — R278 Other lack of coordination: Secondary | ICD-10-CM

## 2017-09-15 DIAGNOSIS — M6281 Muscle weakness (generalized): Secondary | ICD-10-CM

## 2017-09-15 DIAGNOSIS — R2689 Other abnormalities of gait and mobility: Secondary | ICD-10-CM

## 2017-09-15 NOTE — Therapy (Signed)
[place] MAIN [facility] SERVICES [address] [place], [place], [address] Phone: [phone]   Fax:  [phone]  Physical Therapy Treatment  Patient Details  Name: Elaine Clayton MRN: [id number] Date of Birth: [birth year] Referring Provider: Dr. Joanna Hews   Encounter Date: 09/15/2017      PT End of Session - 09/15/17 1122    Visit Number 10   Number of Visits 17   Date for PT Re-Evaluation 09/29/17   Authorization Type G-Code 10   PT Start Time 1115   PT Stop Time 1200   PT Time Calculation (min) 45 min   Equipment Utilized During Treatment Gait belt   Activity Tolerance Patient tolerated treatment well;No increased pain   Behavior During Therapy WFL for tasks assessed/performed      Past Medical History:  Diagnosis Date  . Anemia   . Bipolar affective disorder ([place])    2  . Broken foot 2015   right foot  . Chronic gastritis   . Chronic kidney disease    Dr Holley Raring  . COPD (chronic obstructive pulmonary disease) ([place])   . Dizziness    inner ear (per pt) several times per week  . Dysphagia   . Glaucoma   . Hyperlipemia   . Hypertension    pt has recently come off BP meds.  MD aware. BP seems stable.  . Lithium toxicity 2015  . 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 Neck stiffness    s/p C3-C4 fusion, limited right turn  . Osteoarthritis    back  . Pancreatitis   . Personal history of malignant neoplasm of breast   . Pituitary microadenoma ([place])   . Recurrent UTI   . Renal insufficiency   . Stomach ulcer 2112    Past Surgical History:  Procedure Laterality Date  . APPENDECTOMY    . BREAST BIOPSY Right 2011   neg  . BREAST EXCISIONAL BIOPSY Right 2001   neg  . BREAST EXCISIONAL BIOPSY Right 2008   breast ca 2008 radiation  . BREAST EXCISIONAL BIOPSY Left 10/26/2012   neg  . BREAST SURGERY Left  2013   left breast wide  excision,Intraductal papilloma, ductal hyperplasia and sclerosing adenosis. Microcalcifications associated with columnar cell change. No evidence of atypia or malignancy. Margins are unremarkable.  Marland Kitchen BREAST SURGERY Right November 06, 1999   multiple areas of microcalcification showing evidence of sclerosing adenosis and ductal hyperplasia.  Marland Kitchen BREAST SURGERY Left May 07, 2007   wide excision.  . cataract Right 2013  . CATARACT EXTRACTION W/PHACO Left 03/05/2016   Procedure: CATARACT EXTRACTION PHACO AND INTRAOCULAR LENS PLACEMENT (IOC);  Surgeon: Leandrew Koyanagi, MD;  Location: [place];  Service: Ophthalmology;  Laterality: Left;  . COLONOSCOPY  2004  . COLONOSCOPY N/A 05/14/2015   Procedure: COLONOSCOPY;  Surgeon: Manya Silvas, MD;  Location[facility] ENDOSCOPY;  Service: Endoscopy;  Laterality: N/A;  . ERCP N/A   . ESOPHAGOGASTRODUODENOSCOPY N/A 05/14/2015   Procedure: ESOPHAGOGASTRODUODENOSCOPY (EGD);  Surgeon: Manya Silvas, MD;  Location: [facility] ENDOSCOPY;  Service: Endoscopy;  Laterality: N/A;  . EUS N/A 2011, 2012  . EYE SURGERY  2013  . GLAUCOMA SURGERY Right 2013  . LAPAROSCOPIC RIGHT COLECTOMY Right 06/22/2015   Procedure: LAPAROSCOPIC RIGHT COLECTOMY;  Surgeon: Robert Bellow, MD;  Location: [facility] ORS;  Service: General;  Laterality: Right;  . NECK SURGERY  2006  .  right breast cancer    . SAVORY DILATION N/A 05/14/2015   Procedure: SAVORY DILATION;  Surgeon: Manya Silvas, MD;  Location: [facility] ENDOSCOPY;  Service: Endoscopy;  Laterality: N/A;  . TRABECULECTOMY Left 03/05/2016   Procedure: TRABECULECTOMY WITH [facility] AND [facility];  Surgeon: Leandrew Koyanagi, MD;  Location: [place];  Service: Ophthalmology;  Laterality: Left;  . TUBAL LIGATION      There were no vitals filed for this visit.      Subjective Assessment - 09/15/17 1119    Subjective Patient reports continued back pain after doing a lot of walking while at the beach a few weeks ago.  She reports wearing her heel insert about 1/2 a day, daily and states, "I can tell a large difference with the lift. I can especially tell it when I take it out." She denies any new falls;    Pertinent History 71 yo Female with recent increase in back pain with subsequent numbness/tingling , weakness, and drop foot in B LEs, L>R starting about 3 months ago. She has had increased instability in gait and falls recently with minor injuries. Pt had recent epidural steroid injection L4-5, and L5-S1 with decrease in back pain since.Pt with extensive surgical and medical Hx including breast cancer (last surgery 2013), DJD with cervical fusion C3-5 in 2006, bipolar disorder, HTN, OA in bilateral shoulders L>R, hip and SIJ pain x >20 years.    Limitations Lifting;Standing;Walking;House hold activities   How long can you sit comfortably? Unlimited   How long can you stand comfortably? 20-30 minutes before balance gets worse   How long can you walk comfortably? 40' approx before drop foot or pain starts.    Diagnostic tests MRI 05/20/17: Chronic lumbar scoliosis with mild grade 1 spondylolisthesis from L3 L4 to L5-S1. Advanced disc and facet degeneration at those levels. Mild to moderate spinal and foraminal stenosis at L3-L4 and L4-L5. Right L5 radiculitis. Moderate neural foraminal stenosis also at the left L1 and both L2 nerve levels.   Patient Stated Goals To stop falling   Currently in Pain? Yes   Pain Score 5    Pain Location Back   Pain Orientation Lower   Pain Descriptors / Indicators Tightness   Pain Type Chronic pain   Pain Radiating Towards radiates into left hip;    Pain Onset More than a month ago   Pain Frequency Intermittent   Aggravating Factors  standing/walking   Pain Relieving Factors rest   Effect of Pain on Daily Activities decreased activity tolernace;          TREATMENT: Warm up on Nustep BUE/BLE level 2 x5 min (unbilled);   Seated green tband ankle DF 2x15 bilaterally with  cues for positioning to isolate ankle strengthening;  Standing with red  Band around BLE: Hip abduction x15 bilaterally Hip extension x15 bilaterally  Patient required min-moderate verbal/tactile cues for correct exercise technique including cues to avoid trunk lean and improve posture for better hip strengthening;  Side stepping x10 feet x2 laps each direction with rail assist with cues to keep foot oriented forward for better hip strengthening;   Instructed patient in balance exercise: Standing on airex x2: Heel raises unsupported x15 with min A for safety and cues for weight shift and to keep knees straight for better ankle activation; Mini squat with arms reaching forward (1 set with 2# bar chest press), unsupported, min A 3 sec hold x5 reps Feet slightly apart, BUE ball pass x5 each direction with  CGA for safety and cues to increase reach outside base of support for better  Balance challenge; Narrow base of support, BUE ball up/down x10 reps with close supervision and cues to slow down UE movement for better  Balance; Narrow base of support, BUE balloon taps x4 min with close supervision and intermittent rest to allow patient to reposition feet for balance; Patient exhibits multiple loss of balance having to use rail to help keep balance; Required cues to improve trunk control with reaching outside base of support;  Standing on 1/2 bolster (flat side up) Tandem stance unsupported, CGA x10 sec hold x2 each foot in front;  Feet apart: BUE wand flexion x10 reps with min A for balance and mod Vcs to improve weight shift for better stance control; Heel/toe rock x15 reps without rail assist with min A for safety; demonstrates decreased hip strategies with increased posterior loss of balance; required cues for weight shift and trunk control to reduce fall risk; Feet apart, mini squat with BUE reach forward, 3 sec hold x5 reps with min A for safety; Patient continues to exhibits posterior lean upon  standing requiring cues for better upper trunk control;   Tolerated session well; Denies any pain at end of session;                            PT Education - 09/15/17 1122    Education provided Yes   Education Details balance/strengthening, HEP reinforced;    Person(s) Educated Patient   Methods Explanation;Demonstration;Verbal cues   Comprehension Verbalized understanding;Returned demonstration;Verbal cues required;Need further instruction          PT Short Term Goals - 09/03/17 1312      PT SHORT TERM GOAL #1   Title Patient will deny any falls over past 4 weeks to demonstrate improved safety awareness at home and work.    Baseline Pt had a fall yesterday without injury, and multiple falls since last goal assessment   Time 4   Period Weeks   Status On-going   Target Date 09/02/17     PT SHORT TERM GOAL #2   Title Pt will obtain and appropriately use LRAD (Rollator suggested) in order to decraese fall risk and increase community access.    Baseline Pt has obtained Rollator for use outside   Time 2   Period Weeks   Status Achieved   Target Date 08/19/17           PT Long Term Goals - 09/03/17 1315      PT LONG TERM GOAL #1   Title Patient will be independent in home exercise program to improve strength/mobility for better functional independence with ADLs.   Baseline Pt does part of her HEP about 3-4 times per week.   Time 8   Period Weeks   Status Partially Met   Target Date 09/29/17     PT LONG TERM GOAL #2   Title Patient will increase Berg Balance score by > 6 points to demonstrate decreased fall risk during functional activities.; Deferred as patient's balance is more impaired dynamically and therefore FGA goal more appropriate;    Baseline will defer at this time;    Time 8   Period Weeks   Status Deferred   Target Date 09/29/17     PT LONG TERM GOAL #3   Title Patient (> 20 years old) will complete five times sit to stand  test in < 15 seconds  indicating an increased LE strength and improved balance.   Baseline 22 sec indicating pt is high fall risk for her age      9/13: 23 sec with BUE assist indicating pt is high fall risk for her age      Time 20   Period Weeks   Status On-going   Target Date 09/29/17     PT LONG TERM GOAL #4   Title Patient will ascend/descend 4 stairs without rail assist independently without loss of balance to improve ability to get in/out of home.    Baseline Unsafe on stairs without UE assist   Time 8   Period Weeks   Status On-going   Target Date 09/29/17     PT LONG TERM GOAL #5   Title Patient will increase BLE gross strength to 4/5 as to improve functional strength for independent gait, increased standing tolerance and increased ADL ability.   Baseline 2- to 4-/5 grossly; 09/03/17 grossly 4-/5 in BLEs   Time 8   Period Weeks   Status Partially Met   Target Date 09/29/17     PT LONG TERM GOAL #6   Title Patient will increase Functional Gait Assessment score to >20/30 as to reduce fall risk and improve dynamic gait safety with community ambulation.   Baseline 16/30 indicating pt is a high fall risk on 09/03/17   Time 8   Period Weeks   Status New   Target Date 09/29/17               Plan - 2017/10/15 1155    Clinical Impression Statement Patient instructed in advanced balance exercise. She demonstrates decreased hip strategies with poor trunk control on uneven surface demonstrating posterior loss of balance when on foam/bolster. Patient able to self correct with cues but with increased repetition will return to posterior loss of balance. Patient instructed in advanced LE strengthening. She does report increase discomfort in left hip during hip abduction which is relieved with immediate rest. Concerned about uneven hip height contributing to increased left hip discomfort. Patient would benefit from additional skilled PT intervention to improve strength, balance and gait  safety;    Rehab Potential Fair   Clinical Impairments Affecting Rehab Potential Pt does not use AD and had decreased knowledge of her limitations   PT Frequency 2x / week   PT Duration 8 weeks   PT Treatment/Interventions ADLs/Self Care Home Management;Aquatic Therapy;Biofeedback;Canalith Repostioning;Cryotherapy;Moist Heat;Cognitive remediation;Neuromuscular re-education;Balance training;Therapeutic exercise;Therapeutic activities;Functional mobility training;Stair training;Gait training;DME Instruction;Patient/family education;Orthotic Fit/Training;Manual techniques;Passive range of motion;Visual/perceptual remediation/compensation;Vestibular;Energy conservation;Dry needling   PT Next Visit Plan assess heel insert and see if its helping;    PT Home Exercise Plan continue as given; provided heel insert   Consulted and Agree with Plan of Care Patient      Patient will benefit from skilled therapeutic intervention in order to improve the following deficits and impairments:  Abnormal gait, Decreased activity tolerance, Decreased balance, Decreased cognition, Decreased coordination, Decreased safety awareness, Decreased mobility, Decreased knowledge of use of DME, Decreased knowledge of precautions, Decreased endurance, Decreased skin integrity, Decreased strength, Difficulty walking, Dizziness, Postural dysfunction, Improper body mechanics, Impaired vision/preception, Impaired UE functional use, Impaired sensation, Hypomobility, Decreased range of motion, Impaired flexibility, Pain  Visit Diagnosis: Muscle weakness (generalized)  Other lack of coordination  Other abnormalities of gait and mobility       G-Codes - 10-15-2017 1350    Functional Assessment Tool Used (Outpatient Only) 5 times sit<>Stand, 10 meter walk, ABC scale, clinical judgement  as of 09/03/17   Functional Limitation Mobility: Walking and moving around   Mobility: Walking and Moving Around Current Status [phone]) At least 20  percent but less than 40 percent impaired, limited or restricted   Mobility: Walking and Moving Around Goal Status [phone]) At least 20 percent but less than 40 percent impaired, limited or restricted      Problem List Patient Active Problem List   Diagnosis Date Noted  . Lumbar facet joint syndrome (Primary Area of Pain) (Bilateral) (L>R) 07/29/2017  . Chronic sacroiliac joint pain (Bilateral) (L>R) 07/29/2017  . Chronic hip pain (Bilateral) (L>R) 07/29/2017  . Chronic lower extremity pain (Secondary Area of Pain) (Bilateral) (L>R) 07/29/2017  . Impairment of balance 07/07/2017  . Menopausal and female climacteric states 06/15/2017  . Lumbar foraminal stenosis (T12-S1, multilevel) 06/10/2017  . Lumbar lateral recess stenosis (left L2-3, bilateral L3-4, & right L4-5) 06/10/2017  . Grade 1 Anterolisthesis of L4 over L5 and L5 over S1 06/10/2017  . Lumbar facet hypertrophy (multilevel) 06/10/2017  . Lumbar Ligamentum flavum hypertrophy (HCC) (multilevel) 06/10/2017  . Dropfoot (Left) (L5 radiculopathy) 06/10/2017  . Neurogenic pain 06/09/2017  . DDD (degenerative disc disease), lumbar 06/09/2017  . 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 Closed fracture of metatarsal bone 05/06/2017  . 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 Abnormal MRI, cervical spine 05/06/2017  . 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 Cervical radiculitis 05/06/2017  . Chronic lumbar radiculopathy (Bilateral) (L>R) (left L5) 05/06/2017  . Chronic low back pain (Primary Area of Pain) (Bilateral) (L>R) 05/06/2017  . Abnormal MRI, lumbar spine 05/06/2017  . Chronic pain syndrome 03/19/2017  . Chronic shoulder pain (Fourth Area of Pain) (Bilateral) (L>R) 03/19/2017  . Osteoarthritis of shoulder (Bilateral) 03/19/2017  . Chronic neck pain [facility] Area of Pain) (Bilateral) (L>R) 03/19/2017  .  History of C3-5 ACDF 03/19/2017  . Numbness and tingling in left upper extremity 03/19/2017  . Numbness and tingling of right upper extremity 03/19/2017  . Upper extremity weakness (Bilateral) (L>R) 03/19/2017  . Numbness of upper extremity (Bilateral) (L>R) 03/19/2017  . Numbness and tingling of lower extremity (Bilateral) (L>R) 03/19/2017  . Weakness of lower extremity (Bilateral) (R>L) 03/19/2017  . Chronic abdominal pain (epigastric) 03/19/2017  . Pituitary microadenoma ([place]) 12/08/2016  . History of breast cancer in adulthood 12/08/2016  . Dysmetria 12/08/2016  . Loss of weight 08/15/2015  . Colon polyp 05/25/2015  . Personal history of malignant neoplasm of breast 07/18/2014  . Breast cancer ([place]) 06/28/2014  . Bipolar disorder ([place]) 06/28/2014  . Chronic pancreatitis ([place]) 06/28/2014  . HTN (hypertension) 06/28/2014  . OA (osteoarthritis) 06/28/2014  . Renal insufficiency 06/28/2014  . DDD (degenerative disc disease), cervical 06/28/2014  . Hyperlipidemia 06/28/2014  . COPD (chronic obstructive pulmonary disease) ([place]) 05/17/2014  . Breast cancer of upper-outer quadrant of right female breast ([place]) 05/31/2008    Cowan Pilar PT, DPT 09/15/2017, 1:51 PM  [place] MAIN [facility] SERVICES [place], [place], [address] Phone: [phone]   Fax:  [phone]  Name: Elaine Clayton MRN: [id number] Date of Birth: October 09, 1946

## 2017-09-17 ENCOUNTER — Ambulatory Visit: Payer: Medicare Other | Admitting: Physical Therapy

## 2017-09-17 ENCOUNTER — Encounter: Payer: Self-pay | Admitting: Physical Therapy

## 2017-09-17 DIAGNOSIS — R278 Other lack of coordination: Secondary | ICD-10-CM

## 2017-09-17 DIAGNOSIS — M6281 Muscle weakness (generalized): Secondary | ICD-10-CM

## 2017-09-17 DIAGNOSIS — R2689 Other abnormalities of gait and mobility: Secondary | ICD-10-CM

## 2017-09-17 NOTE — Therapy (Signed)
[place] MAIN [facility] SERVICES [address] [place], [place], [address] Phone: [phone]   Fax:  [phone]  Physical Therapy Treatment  Patient Details  Name: Elaine Clayton MRN: [id number] Date of Birth: 15-Sep-1946 Referring Provider: Dr. Joanna Hews   Encounter Date: 09/17/2017      PT End of Session - 09/17/17 1309    Visit Number 11   Number of Visits 17   Date for PT Re-Evaluation 09/29/17   Authorization Type G-Code 1   PT Start Time 1300   PT Stop Time 1345   PT Time Calculation (min) 45 min   Equipment Utilized During Treatment Gait belt   Activity Tolerance Patient tolerated treatment well;No increased pain   Behavior During Therapy WFL for tasks assessed/performed      Past Medical History:  Diagnosis Date  . Anemia   . Bipolar affective disorder ([place])    2  . Broken foot 2015   right foot  . Chronic gastritis   . Chronic kidney disease    Dr Holley Raring  . COPD (chronic obstructive pulmonary disease) ([place])   . Dizziness    inner ear (per pt) several times per week  . Dysphagia   . Glaucoma   . Hyperlipemia   . Hypertension    pt has recently come off BP meds.  MD aware. BP seems stable.  . Lithium toxicity 2015  . 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 Neck stiffness    s/p C3-C4 fusion, limited right turn  . Osteoarthritis    back  . Pancreatitis   . Personal history of malignant neoplasm of breast   . Pituitary microadenoma ([place])   . Recurrent UTI   . Renal insufficiency   . Stomach ulcer 71    Past Surgical History:  Procedure Laterality Date  . APPENDECTOMY    . BREAST BIOPSY Right 2011   neg  . BREAST EXCISIONAL BIOPSY Right 2001   neg  . BREAST EXCISIONAL BIOPSY Right 2008   breast ca 2008 radiation  . BREAST EXCISIONAL BIOPSY Left 10/26/2012   neg  . BREAST SURGERY Left  2013   left breast wide  excision,Intraductal papilloma, ductal hyperplasia and sclerosing adenosis. Microcalcifications associated with columnar cell change. No evidence of atypia or malignancy. Margins are unremarkable.  Marland Kitchen BREAST SURGERY Right November 06, 1999   multiple areas of microcalcification showing evidence of sclerosing adenosis and ductal hyperplasia.  Marland Kitchen BREAST SURGERY Left May 07, 2007   wide excision.  . cataract Right 2013  . CATARACT EXTRACTION W/PHACO Left 03/05/2016   Procedure: CATARACT EXTRACTION PHACO AND INTRAOCULAR LENS PLACEMENT (IOC);  Surgeon: Leandrew Koyanagi, MD;  Location: [facility];  Service: Ophthalmology;  Laterality: Left;  . COLONOSCOPY  2004  . COLONOSCOPY N/A 05/14/2015   Procedure: COLONOSCOPY;  Surgeon: Manya Silvas, MD;  Location: [facility] ENDOSCOPY;  Service: Endoscopy;  Laterality: N/A;  . ERCP N/A   . ESOPHAGOGASTRODUODENOSCOPY N/A 05/14/2015   Procedure: ESOPHAGOGASTRODUODENOSCOPY (EGD);  Surgeon: Manya Silvas, MD;  Location: [facility] ENDOSCOPY;  Service: Endoscopy;  Laterality: N/A;  . EUS N/A 2011, 2012  . EYE SURGERY  2013  . GLAUCOMA SURGERY Right 2013  . LAPAROSCOPIC RIGHT COLECTOMY Right 06/22/2015   Procedure: LAPAROSCOPIC RIGHT COLECTOMY;  Surgeon: Robert Bellow, MD;  Location: [facility] ORS;  Service: General;  Laterality: Right;  . NECK SURGERY  2006  .  right breast cancer    . SAVORY DILATION N/A 05/14/2015   Procedure: SAVORY DILATION;  Surgeon: Manya Silvas, MD;  Location: [facility] ENDOSCOPY;  Service: Endoscopy;  Laterality: N/A;  . TRABECULECTOMY Left 03/05/2016   Procedure: TRABECULECTOMY WITH [facility] AND [facility];  Surgeon: Leandrew Koyanagi, MD;  Location: [place];  Service: Ophthalmology;  Laterality: Left;  . TUBAL LIGATION      There were no vitals filed for this visit.      Subjective Assessment - 09/17/17 1308    Subjective Patient reports falling out of bed a few days ago and suffered a skin tear on right wrist area. she  presents to therapy with a bandage. She reports feeling increased unsteadiness and feeling woozy a few days ago prior to fall. She reports "I don't know why I have these bad days"    Pertinent History 71 yo Female with recent increase in back pain with subsequent numbness/tingling , weakness, and drop foot in B LEs, L>R starting about 3 months ago. She has had increased instability in gait and falls recently with minor injuries. Pt had recent epidural steroid injection L4-5, and L5-S1 with decrease in back pain since.Pt with extensive surgical and medical Hx including breast cancer (last surgery 2013), DJD with cervical fusion C3-5 in 2006, bipolar disorder, HTN, OA in bilateral shoulders L>R, hip and SIJ pain x >20 years.    Limitations Lifting;Standing;Walking;House hold activities   How long can you sit comfortably? Unlimited   How long can you stand comfortably? 20-30 minutes before balance gets worse   How long can you walk comfortably? 40' approx before drop foot or pain starts.    Diagnostic tests MRI 05/20/17: Chronic lumbar scoliosis with mild grade 1 spondylolisthesis from L3 L4 to L5-S1. Advanced disc and facet degeneration at those levels. Mild to moderate spinal and foraminal stenosis at L3-L4 and L4-L5. Right L5 radiculitis. Moderate neural foraminal stenosis also at the left L1 and both L2 nerve levels.   Patient Stated Goals To stop falling   Currently in Pain? No/denies   Pain Onset More than a month ago        TREATMENT: Warm up on Nustep BUE/BLE level 2 x5 min (unbilled);  Standing with red  Band around BLE: Hip abduction x15 bilaterally Hip extension x15 bilaterally  Hip flexion x15 bilaterally; Patient required min-moderate verbal/tactile cues for correct exercise technique including cues to avoid trunk lean and improve posture for better hip strengthening;  Side stepping x10 feet x2 laps each direction with rail assist with cues to keep foot oriented forward for better hip  strengthening;   Instructed patient in balance exercise: Standing on airex x2: Heel raises unsupported x15 with min A for safety and cues for weight shift and to keep knees straight for better ankle activation; Modified tandem stance, BUE ball up/down x10 reps with close supervision and cues to slow down UE movement for better  Balance; Narrow base of support, BUE balloon taps x4 min with close supervision and intermittent rest to allow patient to reposition feet for balance; Patient exhibits multiple loss of balance having to use rail to help keep balance; Required cues to improve trunk control with reaching outside base of support;  Airex beam: Tandem walk x4 laps with 2-1 rail assist with CGA for safety and cues to slow down steps for better balance control;  Tandem stance with Ball pass side/side x3 reps x3 sets each foot in front with min A for balance and  cues to slow down trunk rotation and to avoid excessive lean outside base of support for better stance control;  Side stepping x3 laps each direction without rail assist; Feet apart, balloon toss x4 min; with close supervision and cues to alternate UE for better balance challenge;  Mini squat feet apart x10 reps, CGA for safety; requires cues to avoid deep squat for better strength and balance control;   Tolerated session well; Denies any pain at end of session;                              PT Education - 09/17/17 1309    Education provided Yes   Education Details balance/strengthening; HEP reinforced;    Person(s) Educated Patient   Methods Explanation;Demonstration;Verbal cues   Comprehension Verbalized understanding;Verbal cues required;Returned demonstration;Need further instruction          PT Short Term Goals - 09/03/17 1312      PT SHORT TERM GOAL #1   Title Patient will deny any falls over past 4 weeks to demonstrate improved safety awareness at home and work.    Baseline Pt had a fall  yesterday without injury, and multiple falls since last goal assessment   Time 4   Period Weeks   Status On-going   Target Date 09/02/17     PT SHORT TERM GOAL #2   Title Pt will obtain and appropriately use LRAD (Rollator suggested) in order to decraese fall risk and increase community access.    Baseline Pt has obtained Rollator for use outside   Time 2   Period Weeks   Status Achieved   Target Date 08/19/17           PT Long Term Goals - 09/03/17 1315      PT LONG TERM GOAL #1   Title Patient will be independent in home exercise program to improve strength/mobility for better functional independence with ADLs.   Baseline Pt does part of her HEP about 3-4 times per week.   Time 8   Period Weeks   Status Partially Met   Target Date 09/29/17     PT LONG TERM GOAL #2   Title Patient will increase Berg Balance score by > 6 points to demonstrate decreased fall risk during functional activities.; Deferred as patient's balance is more impaired dynamically and therefore FGA goal more appropriate;    Baseline will defer at this time;    Time 8   Period Weeks   Status Deferred   Target Date 09/29/17     PT LONG TERM GOAL #3   Title Patient (> 34 years old) will complete five times sit to stand test in < 15 seconds indicating an increased LE strength and improved balance.   Baseline 22 sec indicating pt is high fall risk for her age      9/13: 23 sec with BUE assist indicating pt is high fall risk for her age      Time 63   Period Weeks   Status On-going   Target Date 09/29/17     PT LONG TERM GOAL #4   Title Patient will ascend/descend 4 stairs without rail assist independently without loss of balance to improve ability to get in/out of home.    Baseline Unsafe on stairs without UE assist   Time 8   Period Weeks   Status On-going   Target Date 09/29/17     PT LONG TERM GOAL #  5   Title Patient will increase BLE gross strength to 4/5 as to improve functional strength for  independent gait, increased standing tolerance and increased ADL ability.   Baseline 2- to 4-/5 grossly; 09/03/17 grossly 4-/5 in BLEs   Time 8   Period Weeks   Status Partially Met   Target Date 09/29/17     PT LONG TERM GOAL #6   Title Patient will increase Functional Gait Assessment score to >20/30 as to reduce fall risk and improve dynamic gait safety with community ambulation.   Baseline 16/30 indicating pt is a high fall risk on 09/03/17   Time 8   Period Weeks   Status New   Target Date 09/29/17               Plan - 09/17/17 1548    Clinical Impression Statement Patient instructed in advanced balance exercise. She did have increased difficulty with modified tandem stance /narrow base of support, especially when reaching outside base of support. She requires cues to slow down movement for better balance control. Patient also instructed in advanced LE strengthening. She reports slight increased fatigue with hip abduction exercise especially on left side. She would benefit from additional skilled PT intervention to improve strength, balance and gait safety;    Rehab Potential Fair   Clinical Impairments Affecting Rehab Potential Pt does not use AD and had decreased knowledge of her limitations   PT Frequency 2x / week   PT Duration 8 weeks   PT Treatment/Interventions ADLs/Self Care Home Management;Aquatic Therapy;Biofeedback;Canalith Repostioning;Cryotherapy;Moist Heat;Cognitive remediation;Neuromuscular re-education;Balance training;Therapeutic exercise;Therapeutic activities;Functional mobility training;Stair training;Gait training;DME Instruction;Patient/family education;Orthotic Fit/Training;Manual techniques;Passive range of motion;Visual/perceptual remediation/compensation;Vestibular;Energy conservation;Dry needling   PT Home Exercise Plan continue as given;    Consulted and Agree with Plan of Care Patient      Patient will benefit from skilled therapeutic intervention in  order to improve the following deficits and impairments:  Abnormal gait, Decreased activity tolerance, Decreased balance, Decreased cognition, Decreased coordination, Decreased safety awareness, Decreased mobility, Decreased knowledge of use of DME, Decreased knowledge of precautions, Decreased endurance, Decreased skin integrity, Decreased strength, Difficulty walking, Dizziness, Postural dysfunction, Improper body mechanics, Impaired vision/preception, Impaired UE functional use, Impaired sensation, Hypomobility, Decreased range of motion, Impaired flexibility, Pain  Visit Diagnosis: Muscle weakness (generalized)  Other lack of coordination  Other abnormalities of gait and mobility     Problem List Patient Active Problem List   Diagnosis Date Noted  . Lumbar facet joint syndrome (Primary Area of Pain) (Bilateral) (L>R) 07/29/2017  . Chronic sacroiliac joint pain (Bilateral) (L>R) 07/29/2017  . Chronic hip pain (Bilateral) (L>R) 07/29/2017  . Chronic lower extremity pain (Secondary Area of Pain) (Bilateral) (L>R) 07/29/2017  . Impairment of balance 07/07/2017  . Menopausal and female climacteric states 06/15/2017  . Lumbar foraminal stenosis (T12-S1, multilevel) 06/10/2017  . Lumbar lateral recess stenosis (left L2-3, bilateral L3-4, & right L4-5) 06/10/2017  . Grade 1 Anterolisthesis of L4 over L5 and L5 over S1 06/10/2017  . Lumbar facet hypertrophy (multilevel) 06/10/2017  . Lumbar Ligamentum flavum hypertrophy (HCC) (multilevel) 06/10/2017  . Dropfoot (Left) (L5 radiculopathy) 06/10/2017  . Neurogenic pain 06/09/2017  . DDD (degenerative disc disease), lumbar 06/09/2017  . 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 Closed fracture of metatarsal bone 05/06/2017  . 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 Abnormal MRI, cervical spine 05/06/2017  . 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  Cervical radiculitis 05/06/2017  . Chronic lumbar radiculopathy (Bilateral) (L>R) (left L5) 05/06/2017  . Chronic low back pain (Primary Area of Pain) (Bilateral) (L>R) 05/06/2017  . Abnormal MRI, lumbar spine 05/06/2017  . Chronic pain syndrome 03/19/2017  . Chronic shoulder pain (Fourth Area of Pain) (Bilateral) (L>R) 03/19/2017  . Osteoarthritis of shoulder (Bilateral) 03/19/2017  . Chronic neck pain [facility] Area of Pain) (Bilateral) (L>R) 03/19/2017  . History of C3-5 ACDF 03/19/2017  . Numbness and tingling in left upper extremity 03/19/2017  . Numbness and tingling of right upper extremity 03/19/2017  . Upper extremity weakness (Bilateral) (L>R) 03/19/2017  . Numbness of upper extremity (Bilateral) (L>R) 03/19/2017  . Numbness and tingling of lower extremity (Bilateral) (L>R) 03/19/2017  . Weakness of lower extremity (Bilateral) (R>L) 03/19/2017  . Chronic abdominal pain (epigastric) 03/19/2017  . Pituitary microadenoma ([place]) 12/08/2016  . History of breast cancer in adulthood 12/08/2016  . Dysmetria 12/08/2016  . Loss of weight 08/15/2015  . Colon polyp 05/25/2015  . Personal history of malignant neoplasm of breast 07/18/2014  . Breast cancer ([place]) 06/28/2014  . Bipolar disorder ([place]) 06/28/2014  . Chronic pancreatitis ([place]) 06/28/2014  . HTN (hypertension) 06/28/2014  . OA (osteoarthritis) 06/28/2014  . Renal insufficiency 06/28/2014  . DDD (degenerative disc disease), cervical 06/28/2014  . Hyperlipidemia 06/28/2014  . COPD (chronic obstructive pulmonary disease) ([place]) 05/17/2014  . Breast cancer of upper-outer quadrant of right female breast ([place]) 05/31/2008    Kyser Wandel PT, DPT 09/17/2017, 3:56 PM  [place] MAIN [facility] SERVICES [address] [place], [place], [address] Phone: [phone]   Fax:  [phone]  Name: RACHELANNE WHIDBY MRN: [id number] Date of Birth: 1946-05-18

## 2017-09-21 ENCOUNTER — Ambulatory Visit: Payer: Medicare Other | Attending: Pain Medicine | Admitting: Physical Therapy

## 2017-09-21 ENCOUNTER — Encounter: Payer: Self-pay | Admitting: Physical Therapy

## 2017-09-21 DIAGNOSIS — M6281 Muscle weakness (generalized): Secondary | ICD-10-CM | POA: Diagnosis not present

## 2017-09-21 DIAGNOSIS — R278 Other lack of coordination: Secondary | ICD-10-CM | POA: Insufficient documentation

## 2017-09-21 DIAGNOSIS — R2689 Other abnormalities of gait and mobility: Secondary | ICD-10-CM | POA: Diagnosis present

## 2017-09-21 NOTE — Therapy (Signed)
[place] MAIN [facility] SERVICES [address] [place], [place], [address] Phone: [phone]   Fax:  [phone]  Physical Therapy Treatment  Patient Details  Name: Elaine Clayton MRN: [id number] Date of Birth: 06-Feb-1946 Referring Provider: Dr. Joanna Hews   Encounter Date: 09/21/2017      PT End of Session - 09/21/17 1612    Visit Number 12   Number of Visits 17   Date for PT Re-Evaluation 09/29/17   Authorization Type G-Code 2   PT Start Time 1600   PT Stop Time 1645   PT Time Calculation (min) 45 min   Equipment Utilized During Treatment Gait belt   Activity Tolerance Patient tolerated treatment well;No increased pain   Behavior During Therapy WFL for tasks assessed/performed      Past Medical History:  Diagnosis Date  . Anemia   . Bipolar affective disorder ([place])    2  . Broken foot 2015   right foot  . Chronic gastritis   . Chronic kidney disease    Dr Holley Raring  . COPD (chronic obstructive pulmonary disease) ([place])   . Dizziness    inner ear (per pt) several times per week  . Dysphagia   . Glaucoma   . Hyperlipemia   . Hypertension    pt has recently come off BP meds.  MD aware. BP seems stable.  . Lithium toxicity 2015  . 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 Neck stiffness    s/p C3-C4 fusion, limited right turn  . Osteoarthritis    back  . Pancreatitis   . Personal history of malignant neoplasm of breast   . Pituitary microadenoma ([place])   . Recurrent UTI   . Renal insufficiency   . Stomach ulcer 2112    Past Surgical History:  Procedure Laterality Date  . APPENDECTOMY    . BREAST BIOPSY Right 2011   neg  . BREAST EXCISIONAL BIOPSY Right 2001   neg  . BREAST EXCISIONAL BIOPSY Right 2008   breast ca 2008 radiation  . BREAST EXCISIONAL BIOPSY Left 10/26/2012   neg  . BREAST SURGERY Left  2013   left breast wide  excision,Intraductal papilloma, ductal hyperplasia and sclerosing adenosis. Microcalcifications associated with columnar cell change. No evidence of atypia or malignancy. Margins are unremarkable.  Marland Kitchen BREAST SURGERY Right November 06, 1999   multiple areas of microcalcification showing evidence of sclerosing adenosis and ductal hyperplasia.  Marland Kitchen BREAST SURGERY Left May 07, 2007   wide excision.  . cataract Right 2013  . CATARACT EXTRACTION W/PHACO Left 03/05/2016   Procedure: CATARACT EXTRACTION PHACO AND INTRAOCULAR LENS PLACEMENT (IOC);  Surgeon: Leandrew Koyanagi, MD;  Location: [place];  Service: Ophthalmology;  Laterality: Left;  . COLONOSCOPY  2004  . COLONOSCOPY N/A 05/14/2015   Procedure: COLONOSCOPY;  Surgeon: Manya Silvas, MD;  Location: [facility] ENDOSCOPY;  Service: Endoscopy;  Laterality: N/A;  . ERCP N/A   . ESOPHAGOGASTRODUODENOSCOPY N/A 05/14/2015   Procedure: ESOPHAGOGASTRODUODENOSCOPY (EGD);  Surgeon: Manya Silvas, MD;  Location: [facility] ENDOSCOPY;  Service: Endoscopy;  Laterality: N/A;  . EUS N/A 2011, 2012  . EYE SURGERY  2013  . GLAUCOMA SURGERY Right 2013  . LAPAROSCOPIC RIGHT COLECTOMY Right 06/22/2015   Procedure: LAPAROSCOPIC RIGHT COLECTOMY;  Surgeon: Robert Bellow, MD;  Location: [facility] ORS;  Service: General;  Laterality: Right;  . NECK SURGERY  2006  .  right breast cancer    . SAVORY DILATION N/A 05/14/2015   Procedure: SAVORY DILATION;  Surgeon: Manya Silvas, MD;  Location: [facility] END[facility]COPY;  Service: Endoscopy;  Laterality: N/A;  . TRABECULECTOMY Left 03/05/2016   Procedure: TRABECULECTOMY WITH [facility] AND [employer] SHUNT;  Surgeon: Leandrew Koyanagi, MD;  Location: [place];  Service: Ophthalmology;  Laterality: Left;  . TUBAL LIGATION      There were no vitals filed for this visit.      Subjective Assessment - 09/21/17 1611    Subjective Patient reports feeling unsteady today. "Its not good today." She reports having her husband drive  her to therapy;    Pertinent History 71 yo Female with recent increase in back pain with subsequent numbness/tingling , weakness, and drop foot in B LEs, L>R starting about 3 months ago. She has had increased instability in gait and falls recently with minor injuries. Pt had recent epidural steroid injection L4-5, and L5-S1 with decrease in back pain since.Pt with extensive surgical and medical Hx including breast cancer (last surgery 2013), DJD with cervical fusion C3-5 in 2006, bipolar disorder, HTN, OA in bilateral shoulders L>R, hip and SIJ pain x >20 years.    Limitations Lifting;Standing;Walking;House hold activities   How long can you sit comfortably? Unlimited   How long can you stand comfortably? 20-30 minutes before balance gets worse   How long can you walk comfortably? 40' approx before drop foot or pain starts.    Diagnostic tests MRI 05/20/17: Chronic lumbar scoliosis with mild grade 1 spondylolisthesis from L3 L4 to L5-S1. Advanced disc and facet degeneration at those levels. Mild to moderate spinal and foraminal stenosis at L3-L4 and L4-L5. Right L5 radiculitis. Moderate neural foraminal stenosis also at the left L1 and both L2 nerve levels.   Patient Stated Goals To stop falling   Currently in Pain? No/denies   Pain Onset More than a month ago           TREATMENT: Warm up on Nustep BUE/BLE level 1 x5 min (unbilled);  Instructed patient in balance exercise: Standing on airex x2: Heel raises unsupported x15with min A for safety and cues for weight shift and to keep knees straight for better ankle activation; Alternate toe taps on 10 inch step x15 bilaterally with 1-0 rail assist and min A for safety and cues to improve weight shift and upper trunk control for better stance; Standing one foot on airex, one foot on 10 inch step, BUE ball pass side/side x5 each foot on step; Patient had increased difficulty with RLE stance due to hip abductor weakness;  Modified tandem stance,  BUE ball toss up/down x10 reps with close supervision and cues to slow down UE movement for better Balance;  Forward/backward step over 1/2 bolster without rail assist x5 each with CGA for safety; Side step over 1/2 bolster x5 each direction without rail assist with cues to increase step length and increase hip flexion to improve foot clearance;   Standing on 1/2 bolster: Heel/toe raises with rail assist x15 with cues to keep knee straight and increase ankle ROM for better stretch/flexibility; Standing feet apart, BUE wand flexion x15 with min-mod A to improve balance with overhead lift. Patient exhibits heavy posterior lean requiring cues for weight shift to improve stance control; Tandem stance on 1/2 bolster: unsupported 10 sec hold x2 reps each foot in front with CGA for safety; Requires cues to improve upper trunk control for better balance;  Patient denies any pain at  end of session;                       PT Education - 09/21/17 1612    Education provided Yes   Education Details balance/strengthening, HEP reinforced;    Person(s) Educated Patient   Methods Explanation;Demonstration;Verbal cues   Comprehension Verbalized understanding;Returned demonstration;Verbal cues required;Need further instruction          PT Short Term Goals - 09/03/17 1312      PT SHORT TERM GOAL #1   Title Patient will deny any falls over past 4 weeks to demonstrate improved safety awareness at home and work.    Baseline Pt had a fall yesterday without injury, and multiple falls since last goal assessment   Time 4   Period Weeks   Status On-going   Target Date 09/02/17     PT SHORT TERM GOAL #2   Title Pt will obtain and appropriately use LRAD (Rollator suggested) in order to decraese fall risk and increase community access.    Baseline Pt has obtained Rollator for use outside   Time 2   Period Weeks   Status Achieved   Target Date 08/19/17           PT Long Term Goals -  09/03/17 1315      PT LONG TERM GOAL #1   Title Patient will be independent in home exercise program to improve strength/mobility for better functional independence with ADLs.   Baseline Pt does part of her HEP about 3-4 times per week.   Time 8   Period Weeks   Status Partially Met   Target Date 09/29/17     PT LONG TERM GOAL #2   Title Patient will increase Berg Balance score by > 6 points to demonstrate decreased fall risk during functional activities.; Deferred as patient's balance is more impaired dynamically and therefore FGA goal more appropriate;    Baseline will defer at this time;    Time 8   Period Weeks   Status Deferred   Target Date 09/29/17     PT LONG TERM GOAL #3   Title Patient (> 81 years old) will complete five times sit to stand test in < 15 seconds indicating an increased LE strength and improved balance.   Baseline 22 sec indicating pt is high fall risk for her age      9/13: 23 sec with BUE assist indicating pt is high fall risk for her age      Time 51   Period Weeks   Status On-going   Target Date 09/29/17     PT LONG TERM GOAL #4   Title Patient will ascend/descend 4 stairs without rail assist independently without loss of balance to improve ability to get in/out of home.    Baseline Unsafe on stairs without UE assist   Time 8   Period Weeks   Status On-going   Target Date 09/29/17     PT LONG TERM GOAL #5   Title Patient will increase BLE gross strength to 4/5 as to improve functional strength for independent gait, increased standing tolerance and increased ADL ability.   Baseline 2- to 4-/5 grossly; 09/03/17 grossly 4-/5 in BLEs   Time 8   Period Weeks   Status Partially Met   Target Date 09/29/17     PT LONG TERM GOAL #6   Title Patient will increase Functional Gait Assessment score to >20/30 as to reduce fall risk and improve  dynamic gait safety with community ambulation.   Baseline 16/30 indicating pt is a high fall risk on 09/03/17   Time 8    Period Weeks   Status New   Target Date 09/29/17               Plan - 09/21/17 1650    Clinical Impression Statement Patient presents to therapy with increased unsteadiness. She reports feeling dizzy today and states that she keeps falling to right side. She was instructed in advanced balance exercise on unsteady surface to improve static and dynamic balance. She does exhibit heavy right side lean requiring verbal and visual cues to improve weight shift. Patient would benefit from additional skilled PT intervention to improve strength, balance and gait safety;    Rehab Potential Fair   Clinical Impairments Affecting Rehab Potential Pt does not use AD and had decreased knowledge of her limitations   PT Frequency 2x / week   PT Duration 8 weeks   PT Treatment/Interventions ADLs/Self Care Home Management;Aquatic Therapy;Biofeedback;Canalith Repostioning;Cryotherapy;Moist Heat;Cognitive remediation;Neuromuscular re-education;Balance training;Therapeutic exercise;Therapeutic activities;Functional mobility training;Stair training;Gait training;DME Instruction;Patient/family education;Orthotic Fit/Training;Manual techniques;Passive range of motion;Visual/perceptual remediation/compensation;Vestibular;Energy conservation;Dry needling   PT Home Exercise Plan continue as given;    Consulted and Agree with Plan of Care Patient      Patient will benefit from skilled therapeutic intervention in order to improve the following deficits and impairments:  Abnormal gait, Decreased activity tolerance, Decreased balance, Decreased cognition, Decreased coordination, Decreased safety awareness, Decreased mobility, Decreased knowledge of use of DME, Decreased knowledge of precautions, Decreased endurance, Decreased skin integrity, Decreased strength, Difficulty walking, Dizziness, Postural dysfunction, Improper body mechanics, Impaired vision/preception, Impaired UE functional use, Impaired sensation,  Hypomobility, Decreased range of motion, Impaired flexibility, Pain  Visit Diagnosis: Muscle weakness (generalized)  Other lack of coordination  Other abnormalities of gait and mobility     Problem List Patient Active Problem List   Diagnosis Date Noted  . Lumbar facet joint syndrome (Primary Area of Pain) (Bilateral) (L>R) 07/29/2017  . Chronic sacroiliac joint pain (Bilateral) (L>R) 07/29/2017  . Chronic hip pain (Bilateral) (L>R) 07/29/2017  . Chronic lower extremity pain (Secondary Area of Pain) (Bilateral) (L>R) 07/29/2017  . Impairment of balance 07/07/2017  . Menopausal and female climacteric states 06/15/2017  . Lumbar foraminal stenosis (T12-S1, multilevel) 06/10/2017  . Lumbar lateral recess stenosis (left L2-3, bilateral L3-4, & right L4-5) 06/10/2017  . Grade 1 Anterolisthesis of L4 over L5 and L5 over S1 06/10/2017  . Lumbar facet hypertrophy (multilevel) 06/10/2017  . Lumbar Ligamentum flavum hypertrophy (HCC) (multilevel) 06/10/2017  . Dropfoot (Left) (L5 radiculopathy) 06/10/2017  . Neurogenic pain 06/09/2017  . DDD (degenerative disc disease), lumbar 06/09/2017  . 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 Closed fracture of metatarsal bone 05/06/2017  . 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 Abnormal MRI, cervical spine 05/06/2017  . 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[place]) 05/06/2017  . Cervical radiculitis 05/06/2017  . Chronic lumbar radiculopathy (Bilateral) (L>R) (left L5) 05/06/2017  . Chronic low back pain (Primary Area of Pain) (Bilateral) (L>R) 05/06/2017  . Abnormal MRI, lumbar spine 05/06/2017  . Chronic pain syndrome 03/19/2017  . Chronic shoulder pain (Fourth Area of Pain) (Bilateral) (L>R) 03/19/2017  . Osteoarthritis of shoulder (Bilateral) 03/19/2017  . Chronic neck pain [facility] Area of Pain) (Bilateral) (L>R) 03/19/2017  . History of  C3-5 ACDF 03/19/2017  . Numbness and tingling in left upper extremity 03/19/2017  .  Numbness and tingling of right upper extremity 03/19/2017  . Upper extremity weakness (Bilateral) (L>R) 03/19/2017  . Numbness of upper extremity (Bilateral) (L>R) 03/19/2017  . Numbness and tingling of lower extremity (Bilateral) (L>R) 03/19/2017  . Weakness of lower extremity (Bilateral) (R>L) 03/19/2017  . Chronic abdominal pain (epigastric) 03/19/2017  . Pituitary microadenoma ([place]) 12/08/2016  . History of breast cancer in adulthood 12/08/2016  . Dysmetria 12/08/2016  . Loss of weight 08/15/2015  . Colon polyp 05/25/2015  . Personal history of malignant neoplasm of breast 07/18/2014  . Breast cancer ([place]) 06/28/2014  . Bipolar disorder ([place]) 06/28/2014  . Chronic pancreatitis ([place]) 06/28/2014  . HTN (hypertension) 06/28/2014  . OA (osteoarthritis) 06/28/2014  . Renal insufficiency 06/28/2014  . DDD (degenerative disc disease), cervical 06/28/2014  . Hyperlipidemia 06/28/2014  . COPD (chronic obstructive pulmonary disease) ([place]) 05/17/2014  . Breast cancer of upper-outer quadrant of right female breast ([place]) 05/31/2008    Trotter,Margaret PT, DPT 09/21/2017, 4:57 PM  [place] MAIN [facility] SERVICES [address] [place], [place], [address] Phone: [phone]   Fax:  [phone]  Name: Elaine Clayton MRN: [id number] Date of Birth: July 10, 1946

## 2017-09-23 ENCOUNTER — Ambulatory Visit: Payer: Medicare Other | Admitting: Physical Therapy

## 2017-09-23 ENCOUNTER — Ambulatory Visit (INDEPENDENT_AMBULATORY_CARE_PROVIDER_SITE_OTHER): Payer: Medicare Other | Admitting: Urology

## 2017-09-23 ENCOUNTER — Encounter: Payer: Self-pay | Admitting: Urology

## 2017-09-23 VITALS — BP 158/72 | HR 76 | Ht 64.0 in | Wt 103.0 lb

## 2017-09-23 DIAGNOSIS — R350 Frequency of micturition: Secondary | ICD-10-CM

## 2017-09-23 DIAGNOSIS — N3941 Urge incontinence: Secondary | ICD-10-CM

## 2017-09-23 LAB — URINALYSIS, COMPLETE
BILIRUBIN UA: NEGATIVE
Glucose, UA: NEGATIVE
Ketones, UA: NEGATIVE
Nitrite, UA: NEGATIVE
PH UA: 6.5 (ref 5.0–7.5)
PROTEIN UA: NEGATIVE
Specific Gravity, UA: 1.01 (ref 1.005–1.030)
Urobilinogen, Ur: 0.2 mg/dL (ref 0.2–1.0)

## 2017-09-23 LAB — MICROSCOPIC EXAMINATION
RBC, UA: NONE SEEN /hpf (ref 0–?)
WBC, UA: >30 /hpf — ABNORMAL HIGH (ref 0–?)

## 2017-09-23 MED ORDER — LIDOCAINE HCL 2 % EX GEL
1.0000 "application " | Freq: Once | CUTANEOUS | Status: AC
  Administered 2017-09-23: 1 via URETHRAL

## 2017-09-23 MED ORDER — MIRABEGRON ER 25 MG PO TB24: 25.0000 mg | ORAL_TABLET | Freq: Every day | ORAL | 11 refills | Status: DC

## 2017-09-23 MED ORDER — CEPHALEXIN 250 MG PO CAPS
500.0000 mg | ORAL_CAPSULE | Freq: Once | ORAL | Status: AC
  Administered 2017-09-23: 500 mg via ORAL

## 2017-09-23 NOTE — Progress Notes (Signed)
   09/23/17  CC:  Chief Complaint  Patient presents with  . Cysto    HPI: 71 yo F with severe urinary frequency/ urgency who returns today for cysto to r/o underling bladder pathology.    Remote smoking history.  No gross hematuria.  Trial of Mybetriq 25 mg x 4 weeks with improvement of urgency, frequency, and incontinence.  She would like to continue this medication.  See previous note for details.    Blood pressure (!) 158/72, pulse 76, height 5\' 4"  (1.626 m), weight 103 lb (46.7 kg). NED. A&Ox3.   No respiratory distress   Abd soft, NT, ND Normal external genitalia with patent urethral meatus  Cystoscopy Procedure Note  Patient identification was confirmed, informed consent was obtained, and patient was prepped using Betadine solution.  Lidocaine jelly was administered per urethral meatus.    Preoperative abx where received prior to procedure.    Procedure: - Flexible cystoscope introduced, without any difficulty.   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  - Ureteral orifices were normal in position and appearance.  Post-Procedure: - Patient tolerated the procedure well  Assessment/ Plan:  1. OAB/ urgency/ frequency Cysto today essentially unremarkable Slight narrowing of urethra requiring dilation to accomodates scope Cytology sent today Continue Mybetriq 25 mg with reassessment, BP/ PVR in 3 months   2. Cystitis/ bladder debris UCx today to r/o infection >30 WBC on UA, otherwise unremarkable  Return in about 3 months (around 12/24/2017) for recheck oab symyptoms [place]).  Hollice Espy, MD

## 2017-09-28 ENCOUNTER — Ambulatory Visit: Payer: Medicare Other | Admitting: Physical Therapy

## 2017-09-28 ENCOUNTER — Telehealth: Payer: Self-pay

## 2017-09-28 DIAGNOSIS — N390 Urinary tract infection, site not specified: Secondary | ICD-10-CM

## 2017-09-28 LAB — CULTURE, URINE COMPREHENSIVE

## 2017-09-28 MED ORDER — CIPROFLOXACIN HCL 500 MG PO TABS: 500.0000 mg | ORAL_TABLET | Freq: Two times a day (BID) | ORAL | 0 refills | Status: DC

## 2017-09-28 NOTE — Telephone Encounter (Signed)
Spoke with pt in reference to +ucx and abx. Made aware abx were sent to pharmacy. Pt voiced understanding.

## 2017-09-28 NOTE — Telephone Encounter (Signed)
-----   Message from Hollice Espy, MD sent at 09/28/2017  2:18 PM EDT ----- Please let this patient know that we sent her urine for culture and it did end up growing enterococcus. Like to treat her with Cipro 500 mg twice a day 7 days.  Hollice Espy, MD

## 2017-09-28 NOTE — Therapy (Signed)
[place] MAIN [facility] SERVICES [address] [place], [place], [address] Phone: [phone]   Fax:  [phone]  Patient Details  Name: Elaine Clayton MRN: [id number] Date of Birth: 09/25/1946 Referring Provider:  Milinda Pointer, MD  Encounter Date: 09/28/2017 Patient arrived to clinic without apt and was not able to be seen due to time constraints.  [address], [place] DPT 09/28/2017, 10:55 AM  [place] MAIN [facility] SERVICES [address] [place], [place], [address] Phone: [phone]   Fax:  [phone]

## 2017-09-29 ENCOUNTER — Ambulatory Visit: Payer: Medicare Other | Admitting: Physical Therapy

## 2017-10-01 ENCOUNTER — Telehealth: Payer: Self-pay | Admitting: Urology

## 2017-10-01 ENCOUNTER — Ambulatory Visit: Payer: Medicare Other | Admitting: Physical Therapy

## 2017-10-01 ENCOUNTER — Other Ambulatory Visit: Payer: Self-pay | Admitting: Urology

## 2017-10-01 DIAGNOSIS — B372 Candidiasis of skin and nail: Secondary | ICD-10-CM

## 2017-10-01 NOTE — Telephone Encounter (Signed)
Pt called to ask a question about Cipro that she was prescribed.  Please give her a call at [phone]

## 2017-10-01 NOTE — Telephone Encounter (Signed)
Spoke with pt in reference to cipro. Pt stated that she tends to get a yeast infection with abx therapy. Pt requested a diflucan. Please advise.

## 2017-10-02 MED ORDER — FLUCONAZOLE 150 MG PO TABS: 150.0000 mg | ORAL_TABLET | Freq: Once | ORAL | 0 refills | Status: AC

## 2017-10-02 NOTE — Telephone Encounter (Signed)
That's fine.  Diflucan 150 mg x 1.  Hollice Espy, MD

## 2017-10-02 NOTE — Telephone Encounter (Signed)
Medication sent to pharmacy  

## 2017-10-06 ENCOUNTER — Ambulatory Visit: Payer: Medicare Other | Admitting: Physical Therapy

## 2017-10-06 ENCOUNTER — Telehealth: Payer: Self-pay | Admitting: Pain Medicine

## 2017-10-06 ENCOUNTER — Encounter: Payer: Self-pay | Admitting: Physical Therapy

## 2017-10-06 DIAGNOSIS — R2689 Other abnormalities of gait and mobility: Secondary | ICD-10-CM

## 2017-10-06 DIAGNOSIS — M6281 Muscle weakness (generalized): Secondary | ICD-10-CM | POA: Diagnosis not present

## 2017-10-06 DIAGNOSIS — R278 Other lack of coordination: Secondary | ICD-10-CM

## 2017-10-06 NOTE — Therapy (Signed)
[place] MAIN [facility] SERVICES [address] [place], [place], [address] Phone: [phone]   Fax:  [phone]  Physical Therapy Treatment/Progress Note  Patient Details  Name: Elaine Clayton MRN: [id number] Date of Birth: Feb 05, 1946 Referring Provider: Dr. Joanna Hews   Encounter Date: 10/06/2017      PT End of Session - 10/06/17 1444    Visit Number 13   Number of Visits 25   Date for PT Re-Evaluation 11/03/17   Authorization Type G-Code 3   PT Start Time 1430   PT Stop Time 1520   PT Time Calculation (min) 50 min   Equipment Utilized During Treatment Gait belt   Activity Tolerance Patient tolerated treatment well;No increased pain   Behavior During Therapy WFL for tasks assessed/performed      Past Medical History:  Diagnosis Date  . Anemia   . Bipolar affective disorder ([place])    2  . Broken foot 2015   right foot  . Chronic gastritis   . Chronic kidney disease    Dr Holley Raring  . COPD (chronic obstructive pulmonary disease) ([place])   . Dizziness    inner ear (per pt) several times per week  . Dysphagia   . Glaucoma   . Hyperlipemia   . Hypertension    pt has recently come off BP meds.  MD aware. BP seems stable.  . Lithium toxicity 2015  . 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 Neck stiffness    s/p C3-C4 fusion, limited right turn  . Osteoarthritis    back  . Pancreatitis   . Personal history of malignant neoplasm of breast   . Pituitary microadenoma ([place])   . Recurrent UTI   . Renal insufficiency   . Stomach ulcer 2112    Past Surgical History:  Procedure Laterality Date  . APPENDECTOMY    . BREAST BIOPSY Right 2011   neg  . BREAST EXCISIONAL BIOPSY Right 2001   neg  . BREAST EXCISIONAL BIOPSY Right 2008   breast ca 2008 radiation  . BREAST EXCISIONAL BIOPSY Left 10/26/2012   neg  . BREAST SURGERY Left  2013   left breast  wide excision,Intraductal papilloma, ductal hyperplasia and sclerosing adenosis. Microcalcifications associated with columnar cell change. No evidence of atypia or malignancy. Margins are unremarkable.  Marland Kitchen BREAST SURGERY Right November 06, 1999   multiple areas of microcalcification showing evidence of sclerosing adenosis and ductal hyperplasia.  Marland Kitchen BREAST SURGERY Left May 07, 2007   wide excision.  . cataract Right 2013  . CATARACT EXTRACTION W/PHACO Left 03/05/2016   Procedure: CATARACT EXTRACTION PHACO AND INTRAOCULAR LENS PLACEMENT (IOC);  Surgeon: Leandrew Koyanagi, MD;  Location: [place];  Service: Ophthalmology;  Laterality: Left;  . COLONOSCOPY  2004  . COLONOSCOPY N/A 05/14/2015   Procedure: COLONOSCOPY;  Surgeon: Manya Silvas, MD;  Location[facility] ENDOSCOPY;  Service: Endoscopy;  Laterality: N/A;  . ERCP N/A   . ESOPHAGOGASTRODUODENOSCOPY N/A 05/14/2015   Procedure: ESOPHAGOGASTRODUODENOSCOPY (EGD);  Surgeon: Manya Silvas, MD;  Location: [facility] ENDOSCOPY;  Service: Endoscopy;  Laterality: N/A;  . EUS N/A 2011, 2012  . EYE SURGERY  2013  . GLAUCOMA SURGERY Right 2013  . LAPAROSCOPIC RIGHT COLECTOMY Right 06/22/2015   Procedure: LAPAROSCOPIC RIGHT COLECTOMY;  Surgeon: Robert Bellow, MD;  Location: [facility] ORS;  Service: General;  Laterality: Right;  . NECK SURGERY  2006  .  right breast cancer    . SAVORY DILATION N/A 05/14/2015   Procedure: SAVORY DILATION;  Surgeon: Manya Silvas, MD;  Location: [facility] ENDOSCOPY;  Service: Endoscopy;  Laterality: N/A;  . TRABECULECTOMY Left 03/05/2016   Procedure: TRABECULECTOMY WITH [facility] AND [facility];  Surgeon: Leandrew Koyanagi, MD;  Location: [place];  Service: Ophthalmology;  Laterality: Left;  . TUBAL LIGATION      There were no vitals filed for this visit.      Subjective Assessment - 10/06/17 1441    Subjective Patient reports having a difficult week. She was diagnosed with a UTI and is taking an  antibiotic. She reports also having increased left lower back discomfort today;    Pertinent History 71 yo Female with recent increase in back pain with subsequent numbness/tingling , weakness, and drop foot in B LEs, L>R starting about 3 months ago. She has had increased instability in gait and falls recently with minor injuries. Pt had recent epidural steroid injection L4-5, and L5-S1 with decrease in back pain since.Pt with extensive surgical and medical Hx including breast cancer (last surgery 2013), DJD with cervical fusion C3-5 in 2006, bipolar disorder, HTN, OA in bilateral shoulders L>R, hip and SIJ pain x >20 years.    Limitations Lifting;Standing;Walking;House hold activities   How long can you sit comfortably? Unlimited   How long can you stand comfortably? 20-30 minutes before balance gets worse   How long can you walk comfortably? 40' approx before drop foot or pain starts.    Diagnostic tests MRI 05/20/17: Chronic lumbar scoliosis with mild grade 1 spondylolisthesis from L3 L4 to L5-S1. Advanced disc and facet degeneration at those levels. Mild to moderate spinal and foraminal stenosis at L3-L4 and L4-L5. Right L5 radiculitis. Moderate neural foraminal stenosis also at the left L1 and both L2 nerve levels.   Patient Stated Goals To stop falling   Currently in Pain? Yes   Pain Score 5    Pain Location Back   Pain Orientation Left;Lower   Pain Descriptors / Indicators Aching;Tightness   Pain Type Chronic pain   Pain Onset More than a month ago   Pain Frequency Intermittent   Aggravating Factors  standing/walking   Pain Relieving Factors rest   Effect of Pain on Daily Activities decreased activity tolerance;             [facility] PT Assessment - 10/06/17 0001      Strength   Right Hip Flexion 4-/5   Right Hip ABduction 4/5   Right Hip ADduction 4/5   Left Hip Flexion 4+/5   Left Hip ABduction 4/5   Left Hip ADduction 4/5   Right Knee Flexion 3+/5   Right Knee Extension 4+/5    Left Knee Flexion 4-/5   Left Knee Extension 4+/5   Right Ankle Dorsiflexion 3/5   Right Ankle Plantar Flexion 3/5   Left Ankle Dorsiflexion 3/5   Left Ankle Plantar Flexion 3/5     Standardized Balance Assessment   Five times sit to stand comments  18 sec without HHA (>15 sec indicates increased risk for falls, improved from 09/03/17 which was 23 sec)     Functional Gait  Assessment   Gait Level Surface Walks 20 ft in less than 7 sec but greater than 5.5 sec, uses assistive device, slower speed, mild gait deviations, or deviates 6-10 in outside of the 12 in walkway width.   Change in Gait Speed Able to smoothly change walking speed without loss  of balance or gait deviation. Deviate no more than 6 in outside of the 12 in walkway width.   Gait with Horizontal Head Turns Performs head turns with moderate changes in gait velocity, slows down, deviates 10-15 in outside 12 in walkway width but recovers, can continue to walk.   Gait with Vertical Head Turns Performs task with slight change in gait velocity (eg, minor disruption to smooth gait path), deviates 6 - 10 in outside 12 in walkway width or uses assistive device   Gait and Pivot Turn Pivot turns safely in greater than 3 sec and stops with no loss of balance, or pivot turns safely within 3 sec and stops with mild imbalance, requires small steps to catch balance.   Step Over Obstacle Is able to step over 2 stacked shoe boxes taped together (9 in total height) without changing gait speed. No evidence of imbalance.   Gait with Narrow Base of Support Ambulates less than 4 steps heel to toe or cannot perform without assistance.   Gait with Eyes Closed Walks 20 ft, uses assistive device, slower speed, mild gait deviations, deviates 6-10 in outside 12 in walkway width. Ambulates 20 ft in less than 9 sec but greater than 7 sec.   Ambulating Backwards Walks 20 ft, uses assistive device, slower speed, mild gait deviations, deviates 6-10 in outside 12 in  walkway width.   Steps Alternating feet, must use rail.   Total Score 19       TREATMENT: Warm up on Nustep BUE/BLE level 2 x8 min (Unbilled):   Instructed patient in 5 times sit<>stand, Functional gait assessment/assessed strength etc to address goals. See above; Patient did require min VCs for correct activity technique and supervision to min A with all balance tasks;  Patient instructed in resisted gait 12.5# forward/backward, side/side (4 way) x2 laps each with cues to increase step length and slow down eccentric return for better strengthening and gait safety; She required min A for safety and balance exhibiting most unsteadiness during eccentric return;   Reinforced HEP                  PT Education - 10/06/17 1444    Education provided Yes   Education Details balance/strengthening; Progress towards goals;    Person(s) Educated Patient   Methods Explanation;Demonstration;Verbal cues   Comprehension Verbalized understanding;Returned demonstration;Verbal cues required;Need further instruction          PT Short Term Goals - 10/06/17 1445      PT SHORT TERM GOAL #1   Title Patient will deny any falls over past 4 weeks to demonstrate improved safety awareness at home and work.    Baseline still having multiple falls;    Time 4   Period Weeks   Status On-going   Target Date 11/03/17     PT SHORT TERM GOAL #2   Title Pt will obtain and appropriately use LRAD (Rollator suggested) in order to decraese fall risk and increase community access.    Baseline Pt has obtained Rollator for use outside   Time 2   Period Weeks   Status Achieved           PT Long Term Goals - 10/06/17 1448      PT LONG TERM GOAL #1   Title Patient will be independent in home exercise program to improve strength/mobility for better functional independence with ADLs.   Baseline Pt does part of her HEP about 3-4 times per week.   Time 4  Period Weeks   Status Partially Met    Target Date 11/03/17     PT LONG TERM GOAL #2   Title Patient will increase Berg Balance score by > 6 points to demonstrate decreased fall risk during functional activities.; Deferred as patient's balance is more impaired dynamically and therefore FGA goal more appropriate;    Baseline will defer at this time;    Time 4   Period Weeks   Status Deferred   Target Date 11/03/17     PT LONG TERM GOAL #3   Title Patient (> 26 years old) will complete five times sit to stand test in < 15 seconds indicating an increased LE strength and improved balance.   Baseline 18 sec without HHA   Time 4   Period Weeks   Status Partially Met   Target Date 11/03/17     PT LONG TERM GOAL #4   Title Patient will ascend/descend 4 stairs without rail assist independently without loss of balance to improve ability to get in/out of home.    Baseline able to negotiate steps one step at a time without rail assist;    Time 4   Period Weeks   Status Partially Met   Target Date 11/03/17     PT LONG TERM GOAL #5   Title Patient will increase BLE gross strength to 4/5 as to improve functional strength for independent gait, increased standing tolerance and increased ADL ability.   Baseline --   Time 4   Period Weeks   Status Partially Met   Target Date 11/03/17     PT LONG TERM GOAL #6   Title Patient will increase Functional Gait Assessment score to >20/30 as to reduce fall risk and improve dynamic gait safety with community ambulation.   Baseline 19/30   Time 4   Period Weeks   Status Partially Met   Target Date 11/03/17               Plan - 10/06/17 1531    Clinical Impression Statement Patient instructed in outcome measures to assess progress towards goals. She does exhibit improved LE strength and slight improvement in balance. However she does have unsteadiness with dynamic walking especially with head turns. Patient instructed in resisted walking to challenge strength and balance. She denies  any increase in back pain. She did require min A for safety during resisted walking. Patient would benefit from additional skilled PT intervention to improve strength, balance and gait safety; Patient did miss a few sessions due to bad weather. she would benefit from additional skilled PT intervention to reach goals. Patient is motivated and is making progress.    Rehab Potential Fair   Clinical Impairments Affecting Rehab Potential Pt does not use AD and had decreased knowledge of her limitations   PT Frequency 2x / week   PT Duration 4 weeks   PT Treatment/Interventions ADLs/Self Care Home Management;Aquatic Therapy;Biofeedback;Canalith Repostioning;Cryotherapy;Moist Heat;Cognitive remediation;Neuromuscular re-education;Balance training;Therapeutic exercise;Therapeutic activities;Functional mobility training;Stair training;Gait training;DME Instruction;Patient/family education;Orthotic Fit/Training;Manual techniques;Passive range of motion;Visual/perceptual remediation/compensation;Vestibular;Energy conservation;Dry needling   PT Home Exercise Plan continue as given;    Consulted and Agree with Plan of Care Patient      Patient will benefit from skilled therapeutic intervention in order to improve the following deficits and impairments:  Abnormal gait, Decreased activity tolerance, Decreased balance, Decreased cognition, Decreased coordination, Decreased safety awareness, Decreased mobility, Decreased knowledge of use of DME, Decreased knowledge of precautions, Decreased endurance, Decreased skin integrity, Decreased strength, Difficulty walking,  Dizziness, Postural dysfunction, Improper body mechanics, Impaired vision/preception, Impaired UE functional use, Impaired sensation, Hypomobility, Decreased range of motion, Impaired flexibility, Pain  Visit Diagnosis: Other abnormalities of gait and mobility - Plan: PT plan of care cert/re-cert  Other lack of coordination - Plan: PT plan of care  cert/re-cert  Muscle weakness (generalized) - Plan: PT plan of care cert/re-cert     Problem List Patient Active Problem List   Diagnosis Date Noted  . Lumbar facet joint syndrome (Primary Area of Pain) (Bilateral) (L>R) 07/29/2017  . Chronic sacroiliac joint pain (Bilateral) (L>R) 07/29/2017  . Chronic hip pain (Bilateral) (L>R) 07/29/2017  . Chronic lower extremity pain (Secondary Area of Pain) (Bilateral) (L>R) 07/29/2017  . Impairment of balance 07/07/2017  . Menopausal and female climacteric states 06/15/2017  . Lumbar foraminal stenosis (T12-S1, multilevel) 06/10/2017  . Lumbar lateral recess stenosis (left L2-3, bilateral L3-4, & right L4-5) 06/10/2017  . Grade 1 Anterolisthesis of L4 over L5 and L5 over S1 06/10/2017  . Lumbar facet hypertrophy (multilevel) 06/10/2017  . Lumbar Ligamentum flavum hypertrophy (HCC) (multilevel) 06/10/2017  . Dropfoot (Left) (L5 radiculopathy) 06/10/2017  . Neurogenic pain 06/09/2017  . DDD (degenerative disc disease), lumbar 06/09/2017  . 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 Closed fracture of metatarsal bone 05/06/2017  . 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 Abnormal MRI, cervical spine 05/06/2017  . 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 Cervical radiculitis 05/06/2017  . Chronic lumbar radiculopathy (Bilateral) (L>R) (left L5) 05/06/2017  . Chronic low back pain (Primary Area of Pain) (Bilateral) (L>R) 05/06/2017  . Abnormal MRI, lumbar spine 05/06/2017  . Chronic pain syndrome 03/19/2017  . Chronic shoulder pain (Fourth Area of Pain) (Bilateral) (L>R) 03/19/2017  . Osteoarthritis of shoulder (Bilateral) 03/19/2017  . Chronic neck pain [facility] Area of Pain) (Bilateral) (L>R) 03/19/2017  . History of C3-5 ACDF 03/19/2017  . Numbness and tingling in left upper extremity 03/19/2017  . Numbness and tingling of  right upper extremity 03/19/2017  . Upper extremity weakness (Bilateral) (L>R) 03/19/2017  . Numbness of upper extremity (Bilateral) (L>R) 03/19/2017  . Numbness and tingling of lower extremity (Bilateral) (L>R) 03/19/2017  . Weakness of lower extremity (Bilateral) (R>L) 03/19/2017  . Chronic abdominal pain (epigastric) 03/19/2017  . Pituitary microadenoma ([place]) 12/08/2016  . History of breast cancer in adulthood 12/08/2016  . Dysmetria 12/08/2016  . Loss of weight 08/15/2015  . Colon polyp 05/25/2015  . Personal history of malignant neoplasm of breast 07/18/2014  . Breast cancer ([place]) 06/28/2014  . Bipolar disorder ([place]) 06/28/2014  . Chronic pancreatitis ([place]) 06/28/2014  . HTN (hypertension) 06/28/2014  . OA (osteoarthritis) 06/28/2014  . Renal insufficiency 06/28/2014  . DDD (degenerative disc disease), cervical 06/28/2014  . Hyperlipidemia 06/28/2014  . COPD (chronic obstructive pulmonary disease) ([place]) 05/17/2014  . Breast cancer of upper-outer quadrant of right female breast ([place]) 05/31/2008    Trotter,Margaret PT, DPT 10/06/2017, 3:34 PM  [place] MAIN [facility] SERVICES [place], [place], [address] Phone: [phone]   Fax:  [phone]  Name: LASHAI GROSCH MRN: [id number] Date of Birth: 11/13/1946

## 2017-10-06 NOTE — Telephone Encounter (Signed)
Patient wants to have procedure and I scheduled her for Thurs 10-15-17. Please check this to see if it needs prior auth and let me know if I need to change appt.

## 2017-10-07 NOTE — Telephone Encounter (Signed)
All procedures except for GONB and RFA are ok to schedule with [facility] Medicare. Thanks

## 2017-10-08 ENCOUNTER — Encounter: Payer: Self-pay | Admitting: Physical Therapy

## 2017-10-08 ENCOUNTER — Ambulatory Visit: Payer: Medicare Other | Admitting: Physical Therapy

## 2017-10-08 DIAGNOSIS — R2689 Other abnormalities of gait and mobility: Secondary | ICD-10-CM

## 2017-10-08 DIAGNOSIS — R278 Other lack of coordination: Secondary | ICD-10-CM

## 2017-10-08 DIAGNOSIS — M6281 Muscle weakness (generalized): Secondary | ICD-10-CM | POA: Diagnosis not present

## 2017-10-08 NOTE — Therapy (Signed)
[place] MAIN [facility] SERVICES [address] [place], [place], [address] Phone: [phone]   Fax:  [phone]  Physical Therapy Treatment  Patient Details  Name: Elaine Clayton MRN: [id number] Date of Birth: May 18, 1946 Referring Provider: Dr. Joanna Hews   Encounter Date: 10/08/2017      PT End of Session - 10/08/17 1523    Visit Number 14   Number of Visits 25   Date for PT Re-Evaluation 11/03/17   Authorization Type G-Code 4   PT Start Time 1515   PT Stop Time 1600   PT Time Calculation (min) 45 min   Equipment Utilized During Treatment Gait belt   Activity Tolerance Patient tolerated treatment well;No increased pain   Behavior During Therapy WFL for tasks assessed/performed      Past Medical History:  Diagnosis Date  . Anemia   . Bipolar affective disorder ([place])    2  . Broken foot 2015   right foot  . Chronic gastritis   . Chronic kidney disease    Dr Holley Raring  . COPD (chronic obstructive pulmonary disease) ([place])   . Dizziness    inner ear (per pt) several times per week  . Dysphagia   . Glaucoma   . Hyperlipemia   . Hypertension    pt has recently come off BP meds.  MD aware. BP seems stable.  . Lithium toxicity 2015  . 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 Neck stiffness    s/p C3-C4 fusion, limited right turn  . Osteoarthritis    back  . Pancreatitis   . Personal history of malignant neoplasm of breast   . Pituitary microadenoma ([place])   . Recurrent UTI   . Renal insufficiency   . Stomach ulcer 2112    Past Surgical History:  Procedure Laterality Date  . APPENDECTOMY    . BREAST BIOPSY Right 2011   neg  . BREAST EXCISIONAL BIOPSY Right 2001   neg  . BREAST EXCISIONAL BIOPSY Right 2008   breast ca 2008 radiation  . BREAST EXCISIONAL BIOPSY Left 10/26/2012   neg  . BREAST SURGERY Left  2013   left breast wide  excision,Intraductal papilloma, ductal hyperplasia and sclerosing adenosis. Microcalcifications associated with columnar cell change. No evidence of atypia or malignancy. Margins are unremarkable.  [facility] Kitchen BREAST SURGERY Right November 06, 1999   multiple areas of microcalcification showing evidence of sclerosing adenosis and ductal hyperplasia.  [facility] Kitchen BREAST SURGERY Left May 07, 2007   wide excision.  . cataract Right 2013  . CATARACT EXTRACTION W/PHACO Left 03/05/2016   Procedure: CATARACT EXTRACTION PHACO AND INTRAOCULAR LENS PLACEMENT (IOC);  Surgeon: Leandrew Koyanagi, MD;  Location: [facility];  Service: Ophthalmology;  Laterality: Left;  . COLONOSCOPY  2004  . COLONOSCOPY N/A 05/14/2015   Procedure: COLONOSCOPY;  Surgeon: Manya Silvas, MD;  Location: [facility] ENDOSCOPY;  Service: Endoscopy;  Laterality: N/A;  . ERCP N/A   . ESOPHAGOGASTRODUODENOSCOPY N/A 05/14/2015   Procedure: ESOPHAGOGASTRODUODENOSCOPY (EGD);  Surgeon: Manya Silvas, MD;  Location: [facility] ENDOSCOPY;  Service: Endoscopy;  Laterality: N/A;  . EUS N/A 2011, 2012  . EYE SURGERY  2013  . GLAUCOMA SURGERY Right 2013  . LAPAROSCOPIC RIGHT COLECTOMY Right 06/22/2015   Procedure: LAPAROSCOPIC RIGHT COLECTOMY;  Surgeon: Robert Bellow, MD;  Location: [facility] ORS;  Service: General;  Laterality: Right;  . NECK SURGERY  2006  .  right breast cancer    . SAVORY DILATION N/A 05/14/2015   Procedure: SAVORY DILATION;  Surgeon: Manya Silvas, MD;  Location: [facility] ENDOSCOPY;  Service: Endoscopy;  Laterality: N/A;  . TRABECULECTOMY Left 03/05/2016   Procedure: TRABECULECTOMY WITH [facility] AND [facility];  Surgeon: Leandrew Koyanagi, MD;  Location: [place];  Service: Ophthalmology;  Laterality: Left;  . TUBAL LIGATION      There were no vitals filed for this visit.      Subjective Assessment - 10/08/17 1521    Subjective Patient reports increased back/hip soreness today after trying to help her husband clean the screen  portch furniture; Reports otherwise, "I"m doing well."    Pertinent History 71 yo Female with recent increase in back pain with subsequent numbness/tingling , weakness, and drop foot in B LEs, L>R starting about 3 months ago. She has had increased instability in gait and falls recently with minor injuries. Pt had recent epidural steroid injection L4-5, and L5-S1 with decrease in back pain since.Pt with extensive surgical and medical Hx including breast cancer (last surgery 2013), DJD with cervical fusion C3-5 in 2006, bipolar disorder, HTN, OA in bilateral shoulders L>R, hip and SIJ pain x >20 years.    Limitations Lifting;Standing;Walking;House hold activities   How long can you sit comfortably? Unlimited   How long can you stand comfortably? 20-30 minutes before balance gets worse   How long can you walk comfortably? 40' approx before drop foot or pain starts.    Diagnostic tests MRI 05/20/17: Chronic lumbar scoliosis with mild grade 1 spondylolisthesis from L3 L4 to L5-S1. Advanced disc and facet degeneration at those levels. Mild to moderate spinal and foraminal stenosis at L3-L4 and L4-L5. Right L5 radiculitis. Moderate neural foraminal stenosis also at the left L1 and both L2 nerve levels.   Patient Stated Goals To stop falling   Currently in Pain? Yes   Pain Score 7    Pain Location Back   Pain Orientation Lower   Pain Descriptors / Indicators Aching;Tightness;Sore   Pain Type Chronic pain   Pain Onset More than a month ago   Pain Frequency Intermittent   Aggravating Factors  bending over   Pain Relieving Factors rest/heat   Effect of Pain on Daily Activities decreased activity tolerance;    Multiple Pain Sites No         TREATMENT: Warm up on Nustep BUE/BLE level 3 x5 min (unbilled);  Exercise: HOIST Hamstring curl plate #4, [id number] with cues to increase ROM for better strengthening;  Side step over 1/2 bolster with red tband x10 each direction with cues to increase step length for  better hip strengthening;   Standing on 1/2 bolster Round side up: Heel raises with rail assist x15 with cues to keep knee straight and increase ankle ROM for better stretch/flexibility;   Balance: Instructed patient in balance exercise:  Standing on 1/2 bolster (flat side up): Heel/toe raises/ankle rock x15 with rail assist to facilitate better ankle ROM; Tandem stance on 1/2 bolster: unsupported with BUE ball pass side/side x3 each direction x3 each foot in front;Patient unsteady with narrow base of support requiring cues to improve upper trunk control for better stance control;   Forward/backward step over 1/2 bolster unsupported x10 reps requiring cues to increase step backwards for better foot clearance and safety;   Resisted walking 7.5# forward/backward (with lateral head turns), side step (vertical head turns) 2way, x2 laps each with min-mod A and cues to slow down head turns  and improve weight shift for better gait safety;   Patient reports increased fatigue at end of session; she denies any increase in pain;                          PT Education - 10/08/17 1523    Education provided Yes   Education Details balance/strengthening exercise; HEP reinforced;    Person(s) Educated Patient   Methods Explanation;Demonstration;Verbal cues   Comprehension Verbalized understanding;Returned demonstration;Verbal cues required;Need further instruction          PT Short Term Goals - 10/06/17 1445      PT SHORT TERM GOAL #1   Title Patient will deny any falls over past 4 weeks to demonstrate improved safety awareness at home and work.    Baseline still having multiple falls;    Time 4   Period Weeks   Status On-going   Target Date 11/03/17     PT SHORT TERM GOAL #2   Title Pt will obtain and appropriately use LRAD (Rollator suggested) in order to decraese fall risk and increase community access.    Baseline Pt has obtained Rollator for use outside   Time 2    Period Weeks   Status Achieved           PT Long Term Goals - 10/06/17 1448      PT LONG TERM GOAL #1   Title Patient will be independent in home exercise program to improve strength/mobility for better functional independence with ADLs.   Baseline Pt does part of her HEP about 3-4 times per week.   Time 4   Period Weeks   Status Partially Met   Target Date 11/03/17     PT LONG TERM GOAL #2   Title Patient will increase Berg Balance score by > 6 points to demonstrate decreased fall risk during functional activities.; Deferred as patient's balance is more impaired dynamically and therefore FGA goal more appropriate;    Baseline will defer at this time;    Time 4   Period Weeks   Status Deferred   Target Date 11/03/17     PT LONG TERM GOAL #3   Title Patient (> 52 years old) will complete five times sit to stand test in < 15 seconds indicating an increased LE strength and improved balance.   Baseline 18 sec without HHA   Time 4   Period Weeks   Status Partially Met   Target Date 11/03/17     PT LONG TERM GOAL #4   Title Patient will ascend/descend 4 stairs without rail assist independently without loss of balance to improve ability to get in/out of home.    Baseline able to negotiate steps one step at a time without rail assist;    Time 4   Period Weeks   Status Partially Met   Target Date 11/03/17     PT LONG TERM GOAL #5   Title Patient will increase BLE gross strength to 4/5 as to improve functional strength for independent gait, increased standing tolerance and increased ADL ability.   Baseline --   Time 4   Period Weeks   Status Partially Met   Target Date 11/03/17     PT LONG TERM GOAL #6   Title Patient will increase Functional Gait Assessment score to >20/30 as to reduce fall risk and improve dynamic gait safety with community ambulation.   Baseline 19/30   Time 4   Period Weeks  Status Partially Met   Target Date 11/03/17                Plan - 10/08/17 1647    Clinical Impression Statement Patient instructed in advanced strengthening and balance exercise. She reports less back discomfort with hamstring curl and standing tband exercise today. Patient was able to exhibit good form and motor control. Patient does require cues during balance exercise for better weight shift and stance control. She had significant difficulty with forward/backward step over 1/2 bolster. Patient also had significant difficulty with head turns during resisted walking. She would benefit from additional skilled PT intervention to improve strength, balance and gait safety;    Rehab Potential Fair   Clinical Impairments Affecting Rehab Potential Pt does not use AD and had decreased knowledge of her limitations   PT Frequency 2x / week   PT Duration 4 weeks   PT Treatment/Interventions ADLs/Self Care Home Management;Aquatic Therapy;Biofeedback;Canalith Repostioning;Cryotherapy;Moist Heat;Cognitive remediation;Neuromuscular re-education;Balance training;Therapeutic exercise;Therapeutic activities;Functional mobility training;Stair training;Gait training;DME Instruction;Patient/family education;Orthotic Fit/Training;Manual techniques;Passive range of motion;Visual/perceptual remediation/compensation;Vestibular;Energy conservation;Dry needling   PT Home Exercise Plan continue as given;    Consulted and Agree with Plan of Care Patient      Patient will benefit from skilled therapeutic intervention in order to improve the following deficits and impairments:  Abnormal gait, Decreased activity tolerance, Decreased balance, Decreased cognition, Decreased coordination, Decreased safety awareness, Decreased mobility, Decreased knowledge of use of DME, Decreased knowledge of precautions, Decreased endurance, Decreased skin integrity, Decreased strength, Difficulty walking, Dizziness, Postural dysfunction, Improper body mechanics, Impaired vision/preception, Impaired UE  functional use, Impaired sensation, Hypomobility, Decreased range of motion, Impaired flexibility, Pain  Visit Diagnosis: Other lack of coordination  Other abnormalities of gait and mobility  Muscle weakness (generalized)     Problem List Patient Active Problem List   Diagnosis Date Noted  . Lumbar facet joint syndrome (Primary Area of Pain) (Bilateral) (L>R) 07/29/2017  . Chronic sacroiliac joint pain (Bilateral) (L>R) 07/29/2017  . Chronic hip pain (Bilateral) (L>R) 07/29/2017  . Chronic lower extremity pain (Secondary Area of Pain) (Bilateral) (L>R) 07/29/2017  . Impairment of balance 07/07/2017  . Menopausal and female climacteric states 06/15/2017  . Lumbar foraminal stenosis (T12-S1, multilevel) 06/10/2017  . Lumbar lateral recess stenosis (left L2-3, bilateral L3-4, & right L4-5) 06/10/2017  . Grade 1 Anterolisthesis of L4 over L5 and L5 over S1 06/10/2017  . Lumbar facet hypertrophy (multilevel) 06/10/2017  . Lumbar Ligamentum flavum hypertrophy (HCC) (multilevel) 06/10/2017  . Dropfoot (Left) (L5 radiculopathy) 06/10/2017  . Neurogenic pain 06/09/2017  . DDD (degenerative disc disease), lumbar 06/09/2017  . 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 Closed fracture of metatarsal bone 05/06/2017  . 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 Abnormal MRI, cervical spine 05/06/2017  . 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[place]) 05/06/2017  . Cervical radiculitis 05/06/2017  . Chronic lumbar radiculopathy (Bilateral) (L>R) (left L5) 05/06/2017  . Chronic low back pain (Primary Area of Pain) (Bilateral) (L>R) 05/06/2017  . Abnormal MRI, lumbar spine 05/06/2017  . Chronic pain syndrome 03/19/2017  . Chronic shoulder pain (Fourth Area of Pain) (Bilateral) (L>R) 03/19/2017  . Osteoarthritis of shoulder (Bilateral) 03/19/2017  . Chronic neck pain [facility] Area of Pain)  (Bilateral) (L>R) 03/19/2017  . History of C3-5 ACDF 03/19/2017  . Numbness and tingling in left upper extremity 03/19/2017  . Numbness and tingling of right upper extremity 03/19/2017  . Upper extremity weakness (Bilateral) (  L>R) 03/19/2017  . Numbness of upper extremity (Bilateral) (L>R) 03/19/2017  . Numbness and tingling of lower extremity (Bilateral) (L>R) 03/19/2017  . Weakness of lower extremity (Bilateral) (R>L) 03/19/2017  . Chronic abdominal pain (epigastric) 03/19/2017  . Pituitary microadenoma ([place]) 12/08/2016  . History of breast cancer in adulthood 12/08/2016  . Dysmetria 12/08/2016  . Loss of weight 08/15/2015  . Colon polyp 05/25/2015  . Personal history of malignant neoplasm of breast 07/18/2014  . Breast cancer ([place]) 06/28/2014  . Bipolar disorder ([place]) 06/28/2014  . Chronic pancreatitis ([place]) 06/28/2014  . HTN (hypertension) 06/28/2014  . OA (osteoarthritis) 06/28/2014  . Renal insufficiency 06/28/2014  . DDD (degenerative disc disease), cervical 06/28/2014  . Hyperlipidemia 06/28/2014  . COPD (chronic obstructive pulmonary disease) ([place]) 05/17/2014  . Breast cancer of upper-outer quadrant of right female breast ([place]) 05/31/2008    Trotter,Margaret PT, DPT 10/08/2017, 4:49 PM  [place] MAIN [facility] SERVICES [place], [place], [address] Phone: [phone]   Fax:  [phone]  Name: MARYLAN GLORE MRN: [id number] Date of Birth: [birth year]

## 2017-10-13 ENCOUNTER — Ambulatory Visit: Payer: Medicare Other | Admitting: Physical Therapy

## 2017-10-13 ENCOUNTER — Encounter: Payer: Self-pay | Admitting: Physical Therapy

## 2017-10-13 DIAGNOSIS — M6281 Muscle weakness (generalized): Secondary | ICD-10-CM

## 2017-10-13 DIAGNOSIS — R278 Other lack of coordination: Secondary | ICD-10-CM

## 2017-10-13 DIAGNOSIS — R2689 Other abnormalities of gait and mobility: Secondary | ICD-10-CM

## 2017-10-13 NOTE — Therapy (Signed)
[place] MAIN [facility] SERVICES [address] [place], [place], [address] Phone: [phone]   Fax:  [phone]  Physical Therapy Treatment  Patient Details  Name: Elaine Clayton MRN: [id number] Date of Birth: July 04, 1946 Referring Provider: Dr. Joanna Hews   Encounter Date: 10/13/2017      PT End of Session - 10/13/17 1504    Visit Number 15   Number of Visits 25   Date for PT Re-Evaluation 11/03/17   Authorization Type G-Code 5   PT Start Time 1451   PT Stop Time 1530   PT Time Calculation (min) 39 min   Equipment Utilized During Treatment Gait belt   Activity Tolerance Patient tolerated treatment well;No increased pain   Behavior During Therapy WFL for tasks assessed/performed      Past Medical History:  Diagnosis Date  . Anemia   . Bipolar affective disorder ([place])    2  . Broken foot 2015   right foot  . Chronic gastritis   . Chronic kidney disease    Dr Holley Raring  . COPD (chronic obstructive pulmonary disease) ([place])   . Dizziness    inner ear (per pt) several times per week  . Dysphagia   . Glaucoma   . Hyperlipemia   . Hypertension    pt has recently come off BP meds.  MD aware. BP seems stable.  . Lithium toxicity 2015  . 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 Neck stiffness    s/p C3-C4 fusion, limited right turn  . Osteoarthritis    back  . Pancreatitis   . Personal history of malignant neoplasm of breast   . Pituitary microadenoma ([place])   . Recurrent UTI   . Renal insufficiency   . Stomach ulcer 2112    Past Surgical History:  Procedure Laterality Date  . APPENDECTOMY    . BREAST BIOPSY Right 2011   neg  . BREAST EXCISIONAL BIOPSY Right 2001   neg  . BREAST EXCISIONAL BIOPSY Right 2008   breast ca 2008 radiation  . BREAST EXCISIONAL BIOPSY Left 10/26/2012   neg  . BREAST SURGERY Left  2013   left breast wide  excision,Intraductal papilloma, ductal hyperplasia and sclerosing adenosis. Microcalcifications associated with columnar cell change. No evidence of atypia or malignancy. Margins are unremarkable.  Marland Kitchen BREAST SURGERY Right November 06, 1999   multiple areas of microcalcification showing evidence of sclerosing adenosis and ductal hyperplasia.  Marland Kitchen BREAST SURGERY Left May 07, 2007   wide excision.  . cataract Right 2013  . CATARACT EXTRACTION W/PHACO Left 03/05/2016   Procedure: CATARACT EXTRACTION PHACO AND INTRAOCULAR LENS PLACEMENT (IOC);  Surgeon: Leandrew Koyanagi, MD;  Location: [place] [facility];  Service: Ophthalmology;  Laterality: Left;  . COLONOSCOPY  2004  . COLONOSCOPY N/A 05/14/2015   Procedure: COLONOSCOPY;  Surgeon: Manya Silvas, MD;  Location: [facility] ENDOSCOPY;  Service: Endoscopy;  Laterality: N/A;  . ERCP N/A   . ESOPHAGOGASTRODUODENOSCOPY N/A 05/14/2015   Procedure: ESOPHAGOGASTRODUODENOSCOPY (EGD);  Surgeon: Manya Silvas, MD;  Location: [facility] ENDOSCOPY;  Service: Endoscopy;  Laterality: N/A;  . EUS N/A 2011, 2012  . EYE SURGERY  2013  . GLAUCOMA SURGERY Right 2013  . LAPAROSCOPIC RIGHT COLECTOMY Right 06/22/2015   Procedure: LAPAROSCOPIC RIGHT COLECTOMY;  Surgeon: Robert Bellow, MD;  Location: [facility] ORS;  Service: General;  Laterality: Right;  . NECK SURGERY  2006  .  right breast cancer    . SAVORY DILATION N/A 05/14/2015   Procedure: SAVORY DILATION;  Surgeon: Manya Silvas, MD;  Location: [facility] ENDOSCOPY;  Service: Endoscopy;  Laterality: N/A;  . TRABECULECTOMY Left 03/05/2016   Procedure: TRABECULECTOMY WITH [facility] AND [facility]T;  Surgeon: Leandrew Koyanagi, MD;  Location: [place];  Service: Ophthalmology;  Laterality: Left;  . TUBAL LIGATION      There were no vitals filed for this visit.      Subjective Assessment - 10/13/17 1501    Subjective Patient reports increased falls over last 24 hours. She reports, "I have felt so weak and tired."  She reports increased fatigue today; Patient reports increased left low back pain today; She reports taking Norco today;    Pertinent History 71 yo Female with recent increase in back pain with subsequent numbness/tingling , weakness, and drop foot in B LEs, L>R starting about 3 months ago. She has had increased instability in gait and falls recently with minor injuries. Pt had recent epidural steroid injection L4-5, and L5-S1 with decrease in back pain since.Pt with extensive surgical and medical Hx including breast cancer (last surgery 2013), DJD with cervical fusion C3-5 in 2006, bipolar disorder, HTN, OA in bilateral shoulders L>R, hip and SIJ pain x >20 years.    Limitations Lifting;Standing;Walking;House hold activities   How long can you sit comfortably? Unlimited   How long can you stand comfortably? 20-30 minutes before balance gets worse   How long can you walk comfortably? 40' approx before drop foot or pain starts.    Diagnostic tests MRI 05/20/17: Chronic lumbar scoliosis with mild grade 1 spondylolisthesis from L3 L4 to L5-S1. Advanced disc and facet degeneration at those levels. Mild to moderate spinal and foraminal stenosis at L3-L4 and L4-L5. Right L5 radiculitis. Moderate neural foraminal stenosis also at the left L1 and both L2 nerve levels.   Patient Stated Goals To stop falling   Currently in Pain? Yes   Pain Score 6    Pain Location Back   Pain Orientation Left;Lower   Pain Descriptors / Indicators Heaviness;Numbness;Tightness   Pain Type Chronic pain   Pain Radiating Towards radiates into left hip;    Pain Onset More than a month ago   Pain Frequency Intermittent   Aggravating Factors  bending over, standing/walking   Pain Relieving Factors rest/heat; pain meds   Effect of Pain on Daily Activities decreased activity tolerance;    Multiple Pain Sites No        TREATMENT: Warm up on Nustep BUE/BLE level 2x5 min (unbilled);  Exercise: HOIST Hamstring curl plate #4,  [id number] with cues to increase ROM for better strengthening; Patient also required min Vcs to slow down LE movement for better motor control;   Standing with red tband around BLE: Hip flexion x10 bilaterally with cues to avoid posterior lean for better hip strengthening. Patient reports slight pulling in low back during exercise; Hip abduction x10 bilaterally with min VCs to keep foot turned inward for better hip abductor strengthening;   Balance: Instructed patient in dynamic balance exercise:  Gait in hallway: Forward with head turns side/side x200 feet with cues to increase heel/toe walk for better foot clearance during head turns; Forward with head turns up/down x200 feet, with cues to increase gaze stabilization for better gait safety and less dizziness. Patient tends to slow down gait speed when looking up due to imbalance;    Educated patient in safety with floor transfers to reduce injury  in risk of fall; Patient provided with handout for safety with trying to get up with visual pictures for ways her husband can help if he is able.  Patient reports increased fatigue at end of session; she denies any increase in pain;                              PT Education - 10/13/17 1504    Education provided Yes   Education Details balance/strengthening exercise; HEP reinforced;    Person(s) Educated Patient   Methods Explanation;Demonstration;Verbal cues   Comprehension Verbalized understanding;Returned demonstration;Verbal cues required;Need further instruction          PT Short Term Goals - 10/06/17 1445      PT SHORT TERM GOAL #1   Title Patient will deny any falls over past 4 weeks to demonstrate improved safety awareness at home and work.    Baseline still having multiple falls;    Time 4   Period Weeks   Status On-going   Target Date 11/03/17     PT SHORT TERM GOAL #2   Title Pt will obtain and appropriately use LRAD (Rollator suggested) in order to  decraese fall risk and increase community access.    Baseline Pt has obtained Rollator for use outside   Time 2   Period Weeks   Status Achieved           PT Long Term Goals - 10/06/17 1448      PT LONG TERM GOAL #1   Title Patient will be independent in home exercise program to improve strength/mobility for better functional independence with ADLs.   Baseline Pt does part of her HEP about 3-4 times per week.   Time 4   Period Weeks   Status Partially Met   Target Date 11/03/17     PT LONG TERM GOAL #2   Title Patient will increase Berg Balance score by > 6 points to demonstrate decreased fall risk during functional activities.; Deferred as patient's balance is more impaired dynamically and therefore FGA goal more appropriate;    Baseline will defer at this time;    Time 4   Period Weeks   Status Deferred   Target Date 11/03/17     PT LONG TERM GOAL #3   Title Patient (> 48 years old) will complete five times sit to stand test in < 15 seconds indicating an increased LE strength and improved balance.   Baseline 18 sec without HHA   Time 4   Period Weeks   Status Partially Met   Target Date 11/03/17     PT LONG TERM GOAL #4   Title Patient will ascend/descend 4 stairs without rail assist independently without loss of balance to improve ability to get in/out of home.    Baseline able to negotiate steps one step at a time without rail assist;    Time 4   Period Weeks   Status Partially Met   Target Date 11/03/17     PT LONG TERM GOAL #5   Title Patient will increase BLE gross strength to 4/5 as to improve functional strength for independent gait, increased standing tolerance and increased ADL ability.   Baseline --   Time 4   Period Weeks   Status Partially Met   Target Date 11/03/17     PT LONG TERM GOAL #6   Title Patient will increase Functional Gait Assessment score to >20/30 as to  reduce fall risk and improve dynamic gait safety with community ambulation.    Baseline 19/30   Time 4   Period Weeks   Status Partially Met   Target Date 11/03/17               Plan - 10/13/17 1618    Clinical Impression Statement Patient educated on safe floor transfers to reduce injury in event of fall. Patient appreciated handout and verbalized understanding. Instructed patient in dynamic balance exercise. Patient has difficulty with dual task having trouble maintaining foot clearance with heel/toe pattern during gait  with head turns. Patient instructed in advanced LE strengthening exercise. she would benefit from additional skilled PT intervention to improve strength, balance and gait safety;    Rehab Potential Fair   Clinical Impairments Affecting Rehab Potential Pt does not use AD and had decreased knowledge of her limitations   PT Frequency 2x / week   PT Duration 4 weeks   PT Treatment/Interventions ADLs/Self Care Home Management;Aquatic Therapy;Biofeedback;Canalith Repostioning;Cryotherapy;Moist Heat;Cognitive remediation;Neuromuscular re-education;Balance training;Therapeutic exercise;Therapeutic activities;Functional mobility training;Stair training;Gait training;DME Instruction;Patient/family education;Orthotic Fit/Training;Manual techniques;Passive range of motion;Visual/perceptual remediation/compensation;Vestibular;Energy conservation;Dry needling   PT Home Exercise Plan continue as given;    Consulted and Agree with Plan of Care Patient      Patient will benefit from skilled therapeutic intervention in order to improve the following deficits and impairments:  Abnormal gait, Decreased activity tolerance, Decreased balance, Decreased cognition, Decreased coordination, Decreased safety awareness, Decreased mobility, Decreased knowledge of use of DME, Decreased knowledge of precautions, Decreased endurance, Decreased skin integrity, Decreased strength, Difficulty walking, Dizziness, Postural dysfunction, Improper body mechanics, Impaired  vision/preception, Impaired UE functional use, Impaired sensation, Hypomobility, Decreased range of motion, Impaired flexibility, Pain  Visit Diagnosis: Other lack of coordination  Other abnormalities of gait and mobility  Muscle weakness (generalized)     Problem List Patient Active Problem List   Diagnosis Date Noted  . Lumbar facet joint syndrome (Primary Area of Pain) (Bilateral) (L>R) 07/29/2017  . Chronic sacroiliac joint pain (Bilateral) (L>R) 07/29/2017  . Chronic hip pain (Bilateral) (L>R) 07/29/2017  . Chronic lower extremity pain (Secondary Area of Pain) (Bilateral) (L>R) 07/29/2017  . Impairment of balance 07/07/2017  . Menopausal and female climacteric states 06/15/2017  . Lumbar foraminal stenosis (T12-S1, multilevel) 06/10/2017  . Lumbar lateral recess stenosis (left L2-3, bilateral L3-4, & right L4-5) 06/10/2017  . Grade 1 Anterolisthesis of L4 over L5 and L5 over S1 06/10/2017  . Lumbar facet hypertrophy (multilevel) 06/10/2017  . Lumbar Ligamentum flavum hypertrophy (HCC) (multilevel) 06/10/2017  . Dropfoot (Left) (L5 radiculopathy) 06/10/2017  . Neurogenic pain 06/09/2017  . DDD (degenerative disc disease), lumbar 06/09/2017  . 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 Closed fracture of metatarsal bone 05/06/2017  . 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 Abnormal MRI, cervical spine 05/06/2017  . 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[place]) 05/06/2017  . Cervical radiculitis 05/06/2017  . Chronic lumbar radiculopathy (Bilateral) (L>R) (left L5) 05/06/2017  . Chronic low back pain (Primary Area of Pain) (Bilateral) (L>R) 05/06/2017  . Abnormal MRI, lumbar spine 05/06/2017  . Chronic pain syndrome 03/19/2017  . Chronic shoulder pain (Fourth Area of Pain) (Bilateral) (L>R) 03/19/2017  . Osteoarthritis of shoulder (Bilateral) 03/19/2017  . Chronic neck pain  [facility] Area of Pain) (Bilateral) (L>R) 03/19/2017  . History of C3-5 ACDF 03/19/2017  . Numbness and tingling in left upper extremity 03/19/2017  . Numbness and tingling  of right upper extremity 03/19/2017  . Upper extremity weakness (Bilateral) (L>R) 03/19/2017  . Numbness of upper extremity (Bilateral) (L>R) 03/19/2017  . Numbness and tingling of lower extremity (Bilateral) (L>R) 03/19/2017  . Weakness of lower extremity (Bilateral) (R>L) 03/19/2017  . Chronic abdominal pain (epigastric) 03/19/2017  . Pituitary microadenoma ([place]) 12/08/2016  . History of breast cancer in adulthood 12/08/2016  . Dysmetria 12/08/2016  . Loss of weight 08/15/2015  . Colon polyp 05/25/2015  . Personal history of malignant neoplasm of breast 07/18/2014  . Breast cancer ([place]) 06/28/2014  . Bipolar disorder ([place]) 06/28/2014  . Chronic pancreatitis ([place]) 06/28/2014  . HTN (hypertension) 06/28/2014  . OA (osteoarthritis) 06/28/2014  . Renal insufficiency 06/28/2014  . DDD (degenerative disc disease), cervical 06/28/2014  . Hyperlipidemia 06/28/2014  . COPD (chronic obstructive pulmonary disease) ([place]) 05/17/2014  . Breast cancer of upper-outer quadrant of right female breast ([place]) 05/31/2008    Trotter,Margaret PT, DPT 10/13/2017, 4:21 PM  [place] MAIN [facility] SERVICES [place], [place], [address] Phone: [phone]   Fax:  [phone]  Name: Elaine Clayton MRN: [id number] Date of Birth: 01/10/1946

## 2017-10-15 ENCOUNTER — Ambulatory Visit (HOSPITAL_BASED_OUTPATIENT_CLINIC_OR_DEPARTMENT_OTHER): Payer: Medicare Other | Admitting: Pain Medicine

## 2017-10-15 ENCOUNTER — Ambulatory Visit: Payer: Medicare Other | Admitting: Physical Therapy

## 2017-10-15 ENCOUNTER — Encounter: Payer: Self-pay | Admitting: Pain Medicine

## 2017-10-15 ENCOUNTER — Ambulatory Visit
Admission: RE | Admit: 2017-10-15 | Discharge: 2017-10-15 | Disposition: A | Discharge status: Home / Self Care | Payer: Medicare Other | Source: Ambulatory Visit | Attending: Pain Medicine | Admitting: Pain Medicine

## 2017-10-15 VITALS — BP 160/78 | HR 84 | Temp 97.7°F | Resp 14 | Ht 64.0 in | Wt 103.0 lb

## 2017-10-15 DIAGNOSIS — M545 Low back pain: Secondary | ICD-10-CM | POA: Diagnosis not present

## 2017-10-15 DIAGNOSIS — G8929 Other chronic pain: Secondary | ICD-10-CM

## 2017-10-15 DIAGNOSIS — M533 Sacrococcygeal disorders, not elsewhere classified: Secondary | ICD-10-CM | POA: Diagnosis not present

## 2017-10-15 DIAGNOSIS — M5442 Lumbago with sciatica, left side: Secondary | ICD-10-CM

## 2017-10-15 DIAGNOSIS — M47816 Spondylosis without myelopathy or radiculopathy, lumbar region: Secondary | ICD-10-CM

## 2017-10-15 DIAGNOSIS — M5441 Lumbago with sciatica, right side: Secondary | ICD-10-CM

## 2017-10-15 MED ORDER — TRIAMCINOLONE ACETONIDE 40 MG/ML IJ SUSP
40.0000 mg | Freq: Once | INTRAMUSCULAR | Status: AC
Start: 2017-10-15 — End: 2017-10-15
  Administered 2017-10-15: 40 mg
  Filled 2017-10-15: qty 1

## 2017-10-15 MED ORDER — LACTATED RINGERS IV SOLN
1000.0000 mL | Freq: Once | INTRAVENOUS | Status: AC
  Administered 2017-10-15: 1000 mL via INTRAVENOUS

## 2017-10-15 MED ORDER — FENTANYL CITRATE (PF) 100 MCG/2ML IJ SOLN
25.0000 ug | INTRAMUSCULAR | Status: DC | PRN
  Administered 2017-10-15: 50 ug via INTRAVENOUS
  Filled 2017-10-15: qty 2

## 2017-10-15 MED ORDER — ROPIVACAINE HCL 2 MG/ML IJ SOLN
9.0000 mL | Freq: Once | INTRAMUSCULAR | Status: AC
  Administered 2017-10-15: 9 mL via PERINEURAL
  Filled 2017-10-15: qty 10

## 2017-10-15 MED ORDER — ROPIVACAINE HCL 2 MG/ML IJ SOLN
9.0000 mL | Freq: Once | INTRAMUSCULAR | Status: DC
  Filled 2017-10-15: qty 10

## 2017-10-15 MED ORDER — ROPIVACAINE HCL 2 MG/ML IJ SOLN
9.0000 mL | Freq: Once | INTRAMUSCULAR | Status: AC
  Administered 2017-10-15: 9 mL via INTRA_ARTICULAR
  Filled 2017-10-15: qty 10

## 2017-10-15 MED ORDER — LIDOCAINE HCL 2 % IJ SOLN
10.0000 mL | Freq: Once | INTRAMUSCULAR | Status: AC
  Administered 2017-10-15: 200 mg
  Filled 2017-10-15: qty 40

## 2017-10-15 MED ORDER — METHYLPREDNISOLONE ACETATE 80 MG/ML IJ SUSP
80.0000 mg | Freq: Once | INTRAMUSCULAR | Status: AC
  Administered 2017-10-15: 80 mg via INTRA_ARTICULAR
  Filled 2017-10-15: qty 1

## 2017-10-15 MED ORDER — MIDAZOLAM HCL 5 MG/5ML IJ SOLN
1.0000 mg | INTRAMUSCULAR | Status: DC | PRN
Start: 2017-10-15 — End: 2017-10-15
  Administered 2017-10-15: 2 mg via INTRAVENOUS
  Filled 2017-10-15: qty 5

## 2017-10-15 NOTE — Progress Notes (Signed)
Safety precautions to be maintained throughout the outpatient stay will include: orient to surroundings, keep bed in low position, maintain call bell within reach at all times, provide assistance with transfer out of bed and ambulation.  

## 2017-10-15 NOTE — Patient Instructions (Signed)

## 2017-10-15 NOTE — Progress Notes (Signed)
Patient's Name: Elaine Clayton  MRN: [id number]  Referring Provider: Idelle Crouch, MD  DOB: 07-27-1946  PCP: Idelle Crouch, MD  DOS: 10/15/2017  Note by: Gaspar Cola, MD  Service setting: Ambulatory outpatient  Specialty: Interventional Pain Management  Patient type: Established  Location: [facility] (AMB) Pain Management Facility  Visit type: Interventional Procedure   Primary Reason for Visit: Interventional Pain Management Treatment. CC: Back Pain (lower)  Procedure:  Anesthesia, Analgesia, Anxiolysis:  Procedure #1: Type: Diagnostic Medial Branch Facet Block Region: Lumbar Level: L2, L3, L4, L5, & S1 Medial Branch Level(s) Laterality: Bilateral  Procedure #2: Type: Diagnostic Sacroiliac Joint Block Region: Posterior Lumbosacral Level: PSIS (Posterior Superior Iliac Spine) Sacroiliac Joint Laterality: Bilateral  Type: Local Anesthesia with Moderate (Conscious) Sedation Local Anesthetic: Lidocaine 1% Route: Intravenous (IV) IV Access: Secured Sedation: Meaningful verbal contact was maintained at all times during the procedure  Indication(s): Analgesia and Anxiety   Indications: 1. Lumbar facet joint syndrome (Primary Area of Pain) (Bilateral) (L>R)   2. Lumbar facet hypertrophy (multilevel)   3. Chronic sacroiliac joint pain (Bilateral) (L>R)   4. Chronic low back pain (Primary Area of Pain) (Bilateral) (L>R)    Pain Score: Pre-procedure: 6 /10 Post-procedure: 6 /10  Pre-op Assessment:  [name] is a 71 y.o. (year old), female patient, seen today for interventional treatment. She  has a past surgical history that includes Colonoscopy (2004); Neck surgery (2006); Eye surgery (2013); Appendectomy; Tubal ligation; Colonoscopy (N/A, 05/14/2015); Esophagogastroduodenoscopy (N/A, 05/14/2015); Savory dilation (N/A, 05/14/2015); ERCP (N/A); Glaucoma surgery (Right, 2013); cataract (Right, 2013); Breast surgery (Left,  2013); Breast surgery (Right, November 06, 1999); Breast  surgery (Left, May 07, 2007); EUS (N/A, 2011, 2012); Laparoscopic right colectomy (Right, 06/22/2015); right breast cancer; Trabeculectomy (Left, 03/05/2016); Cataract extraction w/PHACO (Left, 03/05/2016); Breast biopsy (Right, 2011); Breast excisional biopsy (Right, 2001); Breast excisional biopsy (Right, 2008); and Breast excisional biopsy (Left, 10/26/2012). [name] has a current medication list which includes the following prescription(s): alprazolam, aspirin ec, azopt, bisacodyl, brimonidine tartrate-timolol, bupropion, ciprofloxacin, dextromethorphan, escitalopram, esomeprazole, estradiol-levonorgestrel, fluticasone furoate-vilanterol, ipratropium, lido-capsaicin-men-methyl sal, losartan, lumigan, mirabegron er, nicotine polacrilex, ondansetron, ondansetron, ranitidine, rosuvastatin, ventolin hfa, gabapentin, and hydrocodone-acetaminophen, and the following Facility-Administered Medications: fentanyl, midazolam, and ropivacaine (pf) 2 mg/ml (0.2%). Her primarily concern today is the Back Pain (lower)  Initial Vital Signs: There were no vitals taken for this visit. BMI: Estimated body mass index is 17.68 kg/m as calculated from the following:   Height as of this encounter: 5\' 4"  (1.626 m).   Weight as of this encounter: 103 lb (46.7 kg).  Risk Assessment: Allergies: Reviewed. She is allergic to influenza vaccines; flu virus vaccine; sulfa antibiotics; and tetracyclines & related.  Allergy Precautions: None required Coagulopathies: Reviewed. None identified.  Blood-thinner therapy: None at this time Active Infection(s): Reviewed. None identified. Ms. Mandile is afebrile  Site Confirmation: [name] was asked to confirm the procedure and laterality before marking the site Procedure checklist: Completed Consent: Before the procedure and under the influence of no sedative(s), amnesic(s), or anxiolytics, the patient was informed of the treatment options, risks and possible complications. To  fulfill our ethical and legal obligations, as recommended by the American Medical Association's Code of Ethics, I have informed the patient of my clinical impression; the nature and purpose of the treatment or procedure; the risks, benefits, and possible complications of the intervention; the alternatives, including doing nothing; the risk(s) and benefit(s) of the alternative treatment(s) or procedure(s); and the risk(s) and benefit(s) of  doing nothing. The patient was provided information about the general risks and possible complications associated with the procedure. These may include, but are not limited to: failure to achieve desired goals, infection, bleeding, organ or nerve damage, allergic reactions, paralysis, and death. In addition, the patient was informed of those risks and complications associated to Spine-related procedures, such as failure to decrease pain; infection (i.e.: Meningitis, epidural or intraspinal abscess); bleeding (i.e.: epidural hematoma, subarachnoid hemorrhage, or any other type of intraspinal or peri-dural bleeding); organ or nerve damage (i.e.: Any type of peripheral nerve, nerve root, or spinal cord injury) with subsequent damage to sensory, motor, and/or autonomic systems, resulting in permanent pain, numbness, and/or weakness of one or several areas of the body; allergic reactions; (i.e.: anaphylactic reaction); and/or death. Furthermore, the patient was informed of those risks and complications associated with the medications. These include, but are not limited to: allergic reactions (i.e.: anaphylactic or anaphylactoid reaction(s)); adrenal axis suppression; blood sugar elevation that in diabetics may result in ketoacidosis or comma; water retention that in patients with history of congestive heart failure may result in shortness of breath, pulmonary edema, and decompensation with resultant heart failure; weight gain; swelling or edema; medication-induced neural toxicity;  particulate matter embolism and blood vessel occlusion with resultant organ, and/or nervous system infarction; and/or aseptic necrosis of one or more joints. Finally, the patient was informed that Medicine is not an exact science; therefore, there is also the possibility of unforeseen or unpredictable risks and/or possible complications that may result in a catastrophic outcome. The patient indicated having understood very clearly. We have given the patient no guarantees and we have made no promises. Enough time was given to the patient to ask questions, all of which were answered to the patient's satisfaction. Ms. Steinberger has indicated that she wanted to continue with the procedure. Attestation: I, the ordering provider, attest that I have discussed with the patient the benefits, risks, side-effects, alternatives, likelihood of achieving goals, and potential problems during recovery for the procedure that I have provided informed consent. Date: 10/15/2017; Time: 7:51 AM  Pre-Procedure Preparation:  Monitoring: As per clinic protocol. Respiration, ETCO2, SpO2, BP, heart rate and rhythm monitor placed and checked for adequate function Safety Precautions: Patient was assessed for positional comfort and pressure points before starting the procedure. Time-out: I initiated and conducted the "Time-out" before starting the procedure, as per protocol. The patient was asked to participate by confirming the accuracy of the "Time Out" information. Verification of the correct person, site, and procedure were performed and confirmed by me, the nursing staff, and the patient. "Time-out" conducted as per Joint Commission's Universal Protocol (UP.01.01.01). "Time-out" Date & Time: 10/15/2017; 1022 hrs.  Description of Procedure #1 Process:   Time-out: "Time-out" completed before starting procedure, as per protocol. Position: Prone Target Area: For Lumbar Facet blocks, the target is the groove formed by the junction of  the transverse process and superior articular process. For the L5 dorsal ramus, the target is the notch between superior articular process and sacral ala. For the S1 dorsal ramus, the target is the superior and lateral edge of the posterior S1 Sacral foramen. Approach: Paramedial approach. Area Prepped: Entire Posterior Lumbosacral Region Prepping solution: ChloraPrep (2% chlorhexidine gluconate and 70% isopropyl alcohol) Safety Precautions: Aspiration looking for blood return was conducted prior to all injections. At no point did we inject any substances, as a needle was being advanced. No attempts were made at seeking any paresthesias. Safe injection practices and needle disposal techniques  used. Medications properly checked for expiration dates. SDV (single dose vial) medications used.  Description of the Procedure: Protocol guidelines were followed. The patient was placed in position over the fluoroscopy table. The target area was identified and the area prepped in the usual manner. Skin desensitized using vapocoolant spray. Skin & deeper tissues infiltrated with local anesthetic. Appropriate amount of time allowed to pass for local anesthetics to take effect. The procedure needle was introduced through the skin, ipsilateral to the reported pain, and advanced to the target area. Employing the "Medial Branch Technique", the needles were advanced to the angle made by the superior and medial portion of the transverse process, and the lateral and inferior portion of the superior articulating process of the targeted vertebral bodies. This area is known as "Burton's Eye" or the "Eye of the [place] Dog". A procedure needle was introduced through the skin, and this time advanced to the angle made by the superior and medial border of the sacral ala, and the lateral border of the S1 vertebral body. This last needle was later repositioned at the superior and lateral border of the posterior S1 foramen. Negative  aspiration confirmed. Solution injected in intermittent fashion, asking for systemic symptoms every 0.5cc of injectate. The needles were then removed and the area cleansed, making sure to leave some of the prepping solution back to take advantage of its long term bactericidal properties. Start Time: 1022 hrs. Materials:  Needle(s) Type: Regular needle Gauge: 22G Length: 3.5-in Medication(s): We administered lactated ringers, midazolam, fentaNYL, lidocaine, triamcinolone acetonide, triamcinolone acetonide, ropivacaine (PF) 2 mg/mL (0.2%), ropivacaine (PF) 2 mg/mL (0.2%), and methylPREDNISolone acetate. Please see chart orders for dosing details.  Description of Procedure # 2 Process:   Position: Prone Target Area: For upper sacroiliac joint block(s), the target is the superior and posterior margin of the sacroiliac joint. Approach: Ipsilateral approach. Area Prepped: Entire Posterior Lumbosacral Region Prepping solution: ChloraPrep (2% chlorhexidine gluconate and 70% isopropyl alcohol) Safety Precautions: Aspiration looking for blood return was conducted prior to all injections. At no point did we inject any substances, as a needle was being advanced. No attempts were made at seeking any paresthesias. Safe injection practices and needle disposal techniques used. Medications properly checked for expiration dates. SDV (single dose vial) medications used. Description of the Procedure: Protocol guidelines were followed. The patient was placed in position over the fluoroscopy table. The target area was identified and the area prepped in the usual manner. Skin desensitized using vapocoolant spray. Skin & deeper tissues infiltrated with local anesthetic. Appropriate amount of time allowed to pass for local anesthetics to take effect. The procedure needle was advanced under fluoroscopic guidance into the sacroiliac joint until a firm endpoint was obtained. Proper needle placement secured. Negative aspiration  confirmed. Solution injected in intermittent fashion, asking for systemic symptoms every 0.5cc of injectate. The needles were then removed and the area cleansed, making sure to leave some of the prepping solution back to take advantage of its long term bactericidal properties. Vitals:   10/15/17 1035 10/15/17 1047 10/15/17 1053 10/15/17 1107  BP: (!) 170/82 (!) 178/104 (!) 156/98 (!) 160/78  Pulse: 84     Resp: 13 15 16 14   Temp:  97.7 F (36.5 C)    TempSrc:  Oral    SpO2: 96% 98% 100% 99%  Weight:      Height:        End Time: 1036 hrs. Materials:  Needle(s) Type: Regular needle Gauge: 22G Length: 3.5-in Medication(s): We administered  lactated ringers, midazolam, fentaNYL, lidocaine, triamcinolone acetonide, triamcinolone acetonide, ropivacaine (PF) 2 mg/mL (0.2%), ropivacaine (PF) 2 mg/mL (0.2%), and methylPREDNISolone acetate. Please see chart orders for dosing details.  Imaging Guidance (Spinal):  Type of Imaging Technique: Fluoroscopy Guidance (Spinal) Indication(s): Assistance in needle guidance and placement for procedures requiring needle placement in or near specific anatomical locations not easily accessible without such assistance. Exposure Time: Please see nurses notes. Contrast: None used. Fluoroscopic Guidance: I was personally present during the use of fluoroscopy. "Tunnel Vision Technique" used to obtain the best possible view of the target area. Parallax error corrected before commencing the procedure. "Direction-depth-direction" technique used to introduce the needle under continuous pulsed fluoroscopy. Once target was reached, antero-posterior, oblique, and lateral fluoroscopic projection used confirm needle placement in all planes. Images permanently stored in EMR. Interpretation: No contrast injected. I personally interpreted the imaging intraoperatively. Adequate needle placement confirmed in multiple planes. Permanent images saved into the patient's  record.  Antibiotic Prophylaxis:  Indication(s): None identified Antibiotic given: None  Post-operative Assessment:  EBL: None Complications: No immediate post-treatment complications observed by team, or reported by patient. Note: The patient tolerated the entire procedure well. A repeat set of vitals were taken after the procedure and the patient was kept under observation following institutional policy, for this type of procedure. Post-procedural neurological assessment was performed, showing return to baseline, prior to discharge. The patient was provided with post-procedure discharge instructions, including a section on how to identify potential problems. Should any problems arise concerning this procedure, the patient was given instructions to immediately contact us, at any time, without hesitation. In any case, we plan to contact the patient by telephone for a follow-up status report regarding this interventional procedure. Comments:  No additional relevant information.  Plan of Care    Imaging Orders     DG C-Arm 1-60 Min-No Report  Procedure Orders     LUMBAR FACET(MEDIAL BRANCH NERVE BLOCK) MBNB     SACROILIAC JOINT INJECTINS  Medications ordered for procedure: Meds ordered this encounter  Medications  . lactated ringers infusion 1,000 mL  . midazolam (VERSED) 5 MG/5ML injection 1-2 mg    Make sure Flumazenil is available in the pyxis when using this medication. If oversedation occurs, administer 0.2 mg IV over 15 sec. If after 45 sec no response, administer 0.2 mg again over 1 min; may repeat at 1 min intervals; not to exceed 4 doses (1 mg)  . fentaNYL (SUBLIMAZE) injection 25-50 mcg    Make sure Narcan is available in the pyxis when using this medication. In the event of respiratory depression (RR< 8/min): Titrate NARCAN (naloxone) in increments of 0.1 to 0.2 mg IV at 2-3 minute intervals, until desired degree of reversal.  . lidocaine (XYLOCAINE) 2 % (with pres) injection  200 mg  . triamcinolone acetonide (KENALOG-40) injection 40 mg  . ropivacaine (PF) 2 mg/mL (0.2%) (NAROPIN) injection 9 mL  . triamcinolone acetonide (KENALOG-40) injection 40 mg  . ropivacaine (PF) 2 mg/mL (0.2%) (NAROPIN) injection 9 mL  . ropivacaine (PF) 2 mg/mL (0.2%) (NAROPIN) injection 9 mL  . methylPREDNISolone acetate (DEPO-MEDROL) injection 80 mg   Medications administered: We administered lactated ringers, midazolam, fentaNYL, lidocaine, triamcinolone acetonide, triamcinolone acetonide, ropivacaine (PF) 2 mg/mL (0.2%), ropivacaine (PF) 2 mg/mL (0.2%), and methylPREDNISolone acetate.  See the medical record for exact dosing, route, and time of administration.  New Prescriptions   No medications on file   Disposition: Discharge home  Discharge Date & Time: 10/15/2017; 1112 hrs.  Physician-requested Follow-up: Return for post-procedure eval by Dr. Dossie Arbour in 2 wks. Future Appointments Date Time Provider [place]  10/20/2017 1:00 PM Hillis Range, PT [facility] None  10/22/2017 2:30 PM Hillis Range, PT [facility] None  10/29/2017 1:45 PM Hillis Range, PT [facility] None  11/02/2017 1:30 PM Milinda Pointer, MD [facility] None  11/02/2017 3:30 PM Hillis Range, PT [facility] None  11/11/2017 1:45 PM Hillis Range, PT [facility] None  11/19/2017 1:00 PM Hillis Range, PT [facility] None  12/24/2017 1:30 PM Ernestine Conrad, Hunt Oris, PA-C [facility] None  02/10/2018 1:30 PM Dohmeier, Asencion Partridge, MD [facility] None   Primary Care Physician: Idelle Crouch, MD Location: [facility] Note by: Gaspar Cola, MD Date: 10/15/2017; Time: 11:23 AM  Disclaimer:  Medicine is not an exact science. The only guarantee in medicine is that nothing is guaranteed. It is important to note that the decision to proceed with this intervention was based on the information collected from the patient. The Data and conclusions were drawn from  the patient's questionnaire, the interview, and the physical examination. Because the information was provided in large part by the patient, it cannot be guaranteed that it has not been purposely or unconsciously manipulated. Every effort has been made to obtain as much relevant data as possible for this evaluation. It is important to note that the conclusions that lead to this procedure are derived in large part from the available data. Always take into account that the treatment will also be dependent on availability of resources and existing treatment guidelines, considered by other Pain Management Practitioners as being common knowledge and practice, at the time of the intervention. For Medico-Legal purposes, it is also important to point out that variation in procedural techniques and pharmacological choices are the acceptable norm. The indications, contraindications, technique, and results of the above procedure should only be interpreted and judged by a Board-Certified Interventional Pain Specialist with extensive familiarity and expertise in the same exact procedure and technique.

## 2017-10-16 ENCOUNTER — Telehealth: Payer: Self-pay

## 2017-10-16 NOTE — Telephone Encounter (Signed)
Post procedure phone call.  Patient states she is doing just fine.

## 2017-10-20 ENCOUNTER — Ambulatory Visit: Payer: Medicare Other | Admitting: Physical Therapy

## 2017-10-20 NOTE — Progress Notes (Signed)
Results were reviewed and found to be: mildly abnormal  No acute injury or pathology identified  Review would suggest interventional pain management techniques may be of benefit 

## 2017-10-22 ENCOUNTER — Ambulatory Visit: Payer: Medicare Other | Attending: Pain Medicine | Admitting: Physical Therapy

## 2017-10-22 ENCOUNTER — Encounter: Payer: Self-pay | Admitting: Physical Therapy

## 2017-10-22 DIAGNOSIS — R278 Other lack of coordination: Secondary | ICD-10-CM | POA: Diagnosis present

## 2017-10-22 DIAGNOSIS — M6281 Muscle weakness (generalized): Secondary | ICD-10-CM | POA: Diagnosis present

## 2017-10-22 DIAGNOSIS — R2689 Other abnormalities of gait and mobility: Secondary | ICD-10-CM | POA: Diagnosis present

## 2017-10-22 NOTE — Therapy (Signed)
[place] MAIN [facility] SERVICES [address] [place], [place], [address] Phone: [phone]   Fax:  [phone]  Physical Therapy Treatment  Patient Details  Name: Elaine Clayton MRN: [id number] Date of Birth: 21-Oct-1946 Referring Provider: Dr. Joanna Hews   Encounter Date: 10/22/2017      PT End of Session - 10/22/17 1448    Visit Number 16   Number of Visits 25   Date for PT Re-Evaluation 11/03/17   Authorization Type G-Code 6   PT Start Time 1435   PT Stop Time 1515   PT Time Calculation (min) 40 min   Equipment Utilized During Treatment Gait belt   Activity Tolerance Patient tolerated treatment well;No increased pain   Behavior During Therapy WFL for tasks assessed/performed      Past Medical History:  Diagnosis Date  . Anemia   . Bipolar affective disorder ([place])    2  . Broken foot 2015   right foot  . Chronic gastritis   . Chronic kidney disease    Dr Holley Raring  . COPD (chronic obstructive pulmonary disease) ([place])   . Dizziness    inner ear (per pt) several times per week  . Dysphagia   . Glaucoma   . Hyperlipemia   . Hypertension    pt has recently come off BP meds.  MD aware. BP seems stable.  . Lithium toxicity 2015  . 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 Neck stiffness    s/p C3-C4 fusion, limited right turn  . Osteoarthritis    back  . Pancreatitis   . Personal history of malignant neoplasm of breast   . Pituitary microadenoma ([place])   . Recurrent UTI   . Renal insufficiency   . Stomach ulcer 2112    Past Surgical History:  Procedure Laterality Date  . APPENDECTOMY    . BREAST BIOPSY Right 2011   neg  . BREAST EXCISIONAL BIOPSY Right 2001   neg  . BREAST EXCISIONAL BIOPSY Right 2008   breast ca 2008 radiation  . BREAST EXCISIONAL BIOPSY Left 10/26/2012   neg  . BREAST SURGERY Left  2013   left breast wide  excision,Intraductal papilloma, ductal hyperplasia and sclerosing adenosis. Microcalcifications associated with columnar cell change. No evidence of atypia or malignancy. Margins are unremarkable.  [facility] Kitchen BREAST SURGERY Right November 06, 1999   multiple areas of microcalcification showing evidence of sclerosing adenosis and ductal hyperplasia.  Marland Kitchen BREAST SURGERY Left May 07, 2007   wide excision.  . cataract Right 2013  . CATARACT EXTRACTION W/PHACO Left 03/05/2016   Procedure: CATARACT EXTRACTION PHACO AND INTRAOCULAR LENS PLACEMENT (IOC);  Surgeon: Leandrew Koyanagi, MD;  Location: [place];  Service: Ophthalmology;  Laterality: Left;  . COLONOSCOPY  2004  . COLONOSCOPY N/A 05/14/2015   Procedure: COLONOSCOPY;  Surgeon: Manya Silvas, MD;  Location: [facility] ENDOSCOPY;  Service: Endoscopy;  Laterality: N/A;  . ERCP N/A   . ESOPHAGOGASTRODUODENOSCOPY N/A 05/14/2015   Procedure: ESOPHAGOGASTRODUODENOSCOPY (EGD);  Surgeon: Manya Silvas, MD;  Location: [facility] ENDOSCOPY;  Service: Endoscopy;  Laterality: N/A;  . EUS N/A 2011, 2012  . EYE SURGERY  2013  . GLAUCOMA SURGERY Right 2013  . LAPAROSCOPIC RIGHT COLECTOMY Right 06/22/2015   Procedure: LAPAROSCOPIC RIGHT COLECTOMY;  Surgeon: Robert Bellow, MD;  Location: [facility] ORS;  Service: General;  Laterality: Right;  . NECK SURGERY  2006  .  right breast cancer    . SAVORY DILATION N/A 05/14/2015   Procedure: SAVORY DILATION;  Surgeon: Manya Silvas, MD;  Location: [facility] ENDOSCOPY;  Service: Endoscopy;  Laterality: N/A;  . TRABECULECTOMY Left 03/05/2016   Procedure: TRABECULECTOMY WITH [facility] AND [employer]T;  Surgeon: Leandrew Koyanagi, MD;  Location: [place];  Service: Ophthalmology;  Laterality: Left;  . TUBAL LIGATION      There were no vitals filed for this visit.      Subjective Assessment - 10/22/17 1442    Subjective Patient reports having nerve block which went well. She reports a few days ago she started having  increased back pain along upper lumbar spine rather than lower lumbar. She reports, "I don't know why it is hurting there." She reports having increased ataxic movements with short shuffled steps; She reports more near misses; She is still having trouble with getting up from bending down low towards the floor;    Pertinent History 71 yo Female with recent increase in back pain with subsequent numbness/tingling , weakness, and drop foot in B LEs, L>R starting about 3 months ago. She has had increased instability in gait and falls recently with minor injuries. Pt had recent epidural steroid injection L4-5, and L5-S1 with decrease in back pain since.Pt with extensive surgical and medical Hx including breast cancer (last surgery 2013), DJD with cervical fusion C3-5 in 2006, bipolar disorder, HTN, OA in bilateral shoulders L>R, hip and SIJ pain x >20 years.    Limitations Lifting;Standing;Walking;House hold activities   How long can you sit comfortably? Unlimited   How long can you stand comfortably? 20-30 minutes before balance gets worse   How long can you walk comfortably? 40' approx before drop foot or pain starts.    Diagnostic tests MRI 05/20/17: Chronic lumbar scoliosis with mild grade 1 spondylolisthesis from L3 L4 to L5-S1. Advanced disc and facet degeneration at those levels. Mild to moderate spinal and foraminal stenosis at L3-L4 and L4-L5. Right L5 radiculitis. Moderate neural foraminal stenosis also at the left L1 and both L2 nerve levels.   Patient Stated Goals To stop falling   Currently in Pain? Yes   Pain Score 5    Pain Location Back   Pain Orientation Lower   Pain Descriptors / Indicators Burning;Sharp   Pain Type Chronic pain   Pain Radiating Towards weakness in both legs   Pain Onset More than a month ago   Pain Frequency Intermittent   Aggravating Factors  worse with prolonged car trips; bending over; standing/walking;    Pain Relieving Factors rest/heat/pain meds   Effect of Pain  on Daily Activities decreased activity tolerance;    Multiple Pain Sites No          TREATMENT: Warm up on Nustep BUE/BLE level 2x 10 min (unbilled);  Sitting with red tband around BLE: Hip flexion 2x15 bilaterally with cues to avoid posterior lean for better hip strengthening. Patient reports slight pulling in low back during exercise; Hip abduction/ER 2x15 bilaterally with VCs to improve ROM for better strengthening;   Balance: Instructed patient in dynamic balance exercise:  Forward step ups on 4 inch step without rail assist x10 reps with heavy posterior/right lean requiring min-mod A to avoid loss of balance: Patient exhibits slow reaction time having difficulty with shifting weight to keep balance;   Ascend/descend 4 steps with and without rail assist requiring CGA for safety and cues for sequencing, positioning, forward reciprocally with cues to increase step length for  better foot clearance;  4 square stepping, clockwise/counterclockwise x3 laps each direction; multiple direction stepping x2-3 min with min A for balance control with patient exhibiting heavy posterior lean and lateral loss of balance when stepping outside base of support;   Educated patient in safety with floor transfers to reduce injury in risk of fall; Also educated patient in ways to improve safety while at [place] with using AD intermittently and having husband help her [place] walking [place] the dock;  Patient reports increased fatigue at end of session; she denies any increase in pain;                          PT Education - 10/22/17 1447    Education provided Yes   Education Details balance/strengthening, HEP reinforced;    Person(s) Educated Patient   Methods Explanation;Demonstration;Verbal cues   Comprehension Verbalized understanding;Returned demonstration;Verbal cues required;Need further instruction          PT Short Term Goals - 10/06/17 1445      PT SHORT TERM GOAL #1    Title Patient will deny any falls over past 4 weeks to demonstrate improved safety awareness at home and work.    Baseline still having multiple falls;    Time 4   Period Weeks   Status On-going   Target Date 11/03/17     PT SHORT TERM GOAL #2   Title Pt will obtain and appropriately use LRAD (Rollator suggested) in order to decraese fall risk and increase community access.    Baseline Pt has obtained Rollator for use outside   Time 2   Period Weeks   Status Achieved           PT Long Term Goals - 10/06/17 1448      PT LONG TERM GOAL #1   Title Patient will be independent in home exercise program to improve strength/mobility for better functional independence with ADLs.   Baseline Pt does part of her HEP about 3-4 times per week.   Time 4   Period Weeks   Status Partially Met   Target Date 11/03/17     PT LONG TERM GOAL #2   Title Patient will increase Berg Balance score by > 6 points to demonstrate decreased fall risk during functional activities.; Deferred as patient's balance is more impaired dynamically and therefore FGA goal more appropriate;    Baseline will defer at this time;    Time 4   Period Weeks   Status Deferred   Target Date 11/03/17     PT LONG TERM GOAL #3   Title Patient (> 90 years old) will complete five times sit to stand test in < 15 seconds indicating an increased LE strength and improved balance.   Baseline 18 sec without HHA   Time 4   Period Weeks   Status Partially Met   Target Date 11/03/17     PT LONG TERM GOAL #4   Title Patient will ascend/descend 4 stairs without rail assist independently without loss of balance to improve ability to get in/out of home.    Baseline able to negotiate steps one step at a time without rail assist;    Time 4   Period Weeks   Status Partially Met   Target Date 11/03/17     PT LONG TERM GOAL #5   Title Patient will increase BLE gross strength to 4/5 as to improve functional strength for independent gait,  increased standing tolerance  and increased ADL ability.   Baseline --   Time 4   Period Weeks   Status Partially Met   Target Date 11/03/17     PT LONG TERM GOAL #6   Title Patient will increase Functional Gait Assessment score to >20/30 as to reduce fall risk and improve dynamic gait safety with community ambulation.   Baseline 19/30   Time 4   Period Weeks   Status Partially Met   Target Date 11/03/17               Plan - 10/22/17 1523    Clinical Impression Statement Patient instructed in advanced balance exercise. She exhibits heavy posterior lean with decreased reaction time requiring min-mod A to avoid loss of balance. She reports increased ataxic movements which seems to be worse with back pain ;Patient also instructed in advanced LE strengthening with increased resistance/repetitions. Patient would benefit from additional skilled PT Intervention to improve strength, balance and gait safety; Patient educated on improving safety awareness with trying to avoid walking on uneven surfaces;    Rehab Potential Fair   Clinical Impairments Affecting Rehab Potential Pt does not use AD and had decreased knowledge of her limitations   PT Frequency 2x / week   PT Duration 4 weeks   PT Treatment/Interventions ADLs/Self Care Home Management;Aquatic Therapy;Biofeedback;Canalith Repostioning;Cryotherapy;Moist Heat;Cognitive remediation;Neuromuscular re-education;Balance training;Therapeutic exercise;Therapeutic activities;Functional mobility training;Stair training;Gait training;DME Instruction;Patient/family education;Orthotic Fit/Training;Manual techniques;Passive range of motion;Visual/perceptual remediation/compensation;Vestibular;Energy conservation;Dry needling   PT Home Exercise Plan continue as given;    Consulted and Agree with Plan of Care Patient      Patient will benefit from skilled therapeutic intervention in order to improve the following deficits and impairments:  Abnormal  gait, Decreased activity tolerance, Decreased balance, Decreased cognition, Decreased coordination, Decreased safety awareness, Decreased mobility, Decreased knowledge of use of DME, Decreased knowledge of precautions, Decreased endurance, Decreased skin integrity, Decreased strength, Difficulty walking, Dizziness, Postural dysfunction, Improper body mechanics, Impaired vision/preception, Impaired UE functional use, Impaired sensation, Hypomobility, Decreased range of motion, Impaired flexibility, Pain  Visit Diagnosis: Other lack of coordination  Other abnormalities of gait and mobility  Muscle weakness (generalized)     Problem List Patient Active Problem List   Diagnosis Date Noted  . Lumbar facet joint syndrome (Primary Area of Pain) (Bilateral) (L>R) 07/29/2017  . Chronic sacroiliac joint pain (Bilateral) (L>R) 07/29/2017  . Chronic hip pain (Bilateral) (L>R) 07/29/2017  . Chronic lower extremity pain (Secondary Area of Pain) (Bilateral) (L>R) 07/29/2017  . Impairment of balance 07/07/2017  . Menopausal and female climacteric states 06/15/2017  . Lumbar foraminal stenosis (T12-S1, multilevel) 06/10/2017  . Lumbar lateral recess stenosis (left L2-3, bilateral L3-4, & right L4-5) 06/10/2017  . Grade 1 Anterolisthesis of L4 over L5 and L5 over S1 06/10/2017  . Lumbar facet hypertrophy (multilevel) 06/10/2017  . Lumbar Ligamentum flavum hypertrophy (HCC) (multilevel) 06/10/2017  . Dropfoot (Left) (L5 radiculopathy) 06/10/2017  . Neurogenic pain 06/09/2017  . DDD (degenerative disc disease), lumbar 06/09/2017  . 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 Closed fracture of metatarsal bone 05/06/2017  . 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 Abnormal MRI, cervical spine 05/06/2017  . 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[place]) 05/06/2017  . Cervical radiculitis 05/06/2017  .  Chronic lumbar radiculopathy (Bilateral) (L>R) (left L5) 05/06/2017  . Chronic low back pain (Primary Area of Pain) (Bilateral) (L>R) 05/06/2017  . Abnormal MRI, lumbar spine 05/06/2017  . Chronic  pain syndrome 03/19/2017  . Chronic shoulder pain (Fourth Area of Pain) (Bilateral) (L>R) 03/19/2017  . Osteoarthritis of shoulder (Bilateral) 03/19/2017  . Chronic neck pain [facility] Area of Pain) (Bilateral) (L>R) 03/19/2017  . History of C3-5 ACDF 03/19/2017  . Numbness and tingling in left upper extremity 03/19/2017  . Numbness and tingling of right upper extremity 03/19/2017  . Upper extremity weakness (Bilateral) (L>R) 03/19/2017  . Numbness of upper extremity (Bilateral) (L>R) 03/19/2017  . Numbness and tingling of lower extremity (Bilateral) (L>R) 03/19/2017  . Weakness of lower extremity (Bilateral) (R>L) 03/19/2017  . Chronic abdominal pain (epigastric) 03/19/2017  . Pituitary microadenoma ([place]) 12/08/2016  . History of breast cancer in adulthood 12/08/2016  . Dysmetria 12/08/2016  . Loss of weight 08/15/2015  . Colon polyp 05/25/2015  . Personal history of malignant neoplasm of breast 07/18/2014  . Breast cancer ([place]) 06/28/2014  . Bipolar disorder ([place]) 06/28/2014  . Chronic pancreatitis ([place]) 06/28/2014  . HTN (hypertension) 06/28/2014  . OA (osteoarthritis) 06/28/2014  . Renal insufficiency 06/28/2014  . DDD (degenerative disc disease), cervical 06/28/2014  . Hyperlipidemia 06/28/2014  . COPD (chronic obstructive pulmonary disease) ([place]) 05/17/2014  . Breast cancer of upper-outer quadrant of right female breast ([place]) 05/31/2008    Trotter,Margaret PT, DPT 10/22/2017, 3:28 PM  [place] MAIN [facility] SERVICES [address] [place], [place], [address] Phone: [phone]   Fax:  [phone]  Name: ROE KOFFMAN MRN: [id number] Date of Birth: 04/02/1946

## 2017-10-29 ENCOUNTER — Ambulatory Visit: Payer: Medicare Other | Admitting: Physical Therapy

## 2017-10-29 ENCOUNTER — Encounter: Payer: Self-pay | Admitting: Physical Therapy

## 2017-10-29 DIAGNOSIS — M6281 Muscle weakness (generalized): Secondary | ICD-10-CM

## 2017-10-29 DIAGNOSIS — R278 Other lack of coordination: Secondary | ICD-10-CM

## 2017-10-29 DIAGNOSIS — R2689 Other abnormalities of gait and mobility: Secondary | ICD-10-CM

## 2017-10-29 NOTE — Therapy (Signed)
[place] MAIN [facility] SERVICES [address] [place], [place], [address] Phone: [phone]   Fax:  [phone]  Physical Therapy Treatment  Patient Details  Name: Elaine Clayton MRN: [id number] Date of Birth: 1946/06/06 Referring Provider: Dr. Joanna Hews    Encounter Date: 10/29/2017  PT End of Session - 10/29/17 1400    Visit Number  17    Number of Visits  25    Date for PT Re-Evaluation  11/03/17    Authorization Type  G-Code 7    PT Start Time  1350    PT Stop Time  1430    PT Time Calculation (min)  40 min    Equipment Utilized During Treatment  Gait belt    Activity Tolerance  Patient tolerated treatment well;No increased pain    Behavior During Therapy  WFL for tasks assessed/performed       Past Medical History:  Diagnosis Date  . Anemia   . Bipolar affective disorder ([place])    2  . Broken foot 2015   right foot  . Chronic gastritis   . Chronic kidney disease    Dr Holley Raring  . COPD (chronic obstructive pulmonary disease) ([place])   . Dizziness    inner ear (per pt) several times per week  . Dysphagia   . Glaucoma   . Hyperlipemia   . Hypertension    pt has recently come off BP meds.  MD aware. BP seems stable.  . Lithium toxicity 2015  . 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 Neck stiffness    s/p C3-C4 fusion, limited right turn  . Osteoarthritis    back  . Pancreatitis   . Personal history of malignant neoplasm of breast   . Pituitary microadenoma ([place])   . Recurrent UTI   . Renal insufficiency   . Stomach ulcer 2112    Past Surgical History:  Procedure Laterality Date  . APPENDECTOMY    . BREAST BIOPSY Right 2011   neg  . BREAST EXCISIONAL BIOPSY Right 2001   neg  . BREAST EXCISIONAL BIOPSY Right 2008   breast ca 2008 radiation  . BREAST EXCISIONAL BIOPSY Left 10/26/2012   neg  . BREAST SURGERY Left  2013   left breast  wide excision,Intraductal papilloma, ductal hyperplasia and sclerosing adenosis. Microcalcifications associated with columnar cell change. No evidence of atypia or malignancy. Margins are unremarkable.  [facility] Kitchen BREAST SURGERY Right November 06, 1999   multiple areas of microcalcification showing evidence of sclerosing adenosis and ductal hyperplasia.  Marland Kitchen BREAST SURGERY Left May 07, 2007   wide excision.  . cataract Right 2013  . COLONOSCOPY  2004  . ERCP N/A   . EUS N/A 2011, 2012  . EYE SURGERY  2013  . GLAUCOMA SURGERY Right 2013  . NECK SURGERY  2006  . right breast cancer    . TUBAL LIGATION      There were no vitals filed for this visit.  Subjective Assessment - 10/29/17 1358    Subjective  Patient reports doing better today; She reports that she has noticed the more fatigued she is the worse her discomfort; Patient reports some soreness in hip but not bad; She reports doing better with stair negotiation at home;     Pertinent History  71 yo Female with recent increase in back pain with subsequent numbness/tingling , weakness, and drop foot in B  LEs, L>R starting about 3 months ago. She has had increased instability in gait and falls recently with minor injuries. Pt had recent epidural steroid injection L4-5, and L5-S1 with decrease in back pain since.Pt with extensive surgical and medical Hx including breast cancer (last surgery 2013), DJD with cervical fusion C3-5 in 2006, bipolar disorder, HTN, OA in bilateral shoulders L>R, hip and SIJ pain x >20 years.     Limitations  Lifting;Standing;Walking;House hold activities    How long can you sit comfortably?  Unlimited    How long can you stand comfortably?  20-30 minutes before balance gets worse    How long can you walk comfortably?  40' approx before drop foot or pain starts.     Diagnostic tests  MRI 05/20/17: Chronic lumbar scoliosis with mild grade 1 spondylolisthesis from L3 L4 to L5-S1. Advanced disc and facet degeneration at those  levels. Mild to moderate spinal and foraminal stenosis at L3-L4 and L4-L5. Right L5 radiculitis. Moderate neural foraminal stenosis also at the left L1 and both L2 nerve levels.    Patient Stated Goals  To stop falling    Currently in Pain?  Yes    Pain Score  3     Pain Location  Hip    Pain Orientation  Left;Posterior    Pain Descriptors / Indicators  Aching;Sore    Pain Type  Chronic pain    Pain Onset  More than a month ago    Pain Frequency  Intermittent    Aggravating Factors   worse with prolonged car trips; standing/walking;     Pain Relieving Factors  rest/heat/pain meds;     Effect of Pain on Daily Activities  decreased activity tolerance;     Multiple Pain Sites  No          TREATMENT: Warm up on Nustep BUE/BLE level 2x 5 min (unbilled);  Sitting with green tband around BLE: Hip flexion 2x15 bilaterally with cues to avoid posterior lean for better hip strengthening. Patient reports slight pulling in low back during exercise; Hip abduction/ER 2x15 bilaterally with VCs to improve ROM for better strengthening;   Balance: Instructed patient in dynamicbalance exercise:  Forward step ups on 4 inch step without rail assist x10 reps with mild posterior/right lean requiring CGA to avoid loss of balance: Patient requires cues to increase hip/knee extension at end range for better stance control;   4 square stepping, clockwise/counterclockwise x3 laps each direction; multiple direction stepping x2-3 min with min A for balance control with patient exhibiting mild lateral loss of balance when stepping outside base of support;  Standing on 1/2 bolster (flat side up): Heel/toe raises/ankle rock x15 with rail assist to facilitate better ankle ROM; Feet apart, BUE arm raise, unsupported, min A for safety to facilitate better ankle/hip strategies; Patient requires cues to improve weight shift for better stance control; She did have increased posterior lean with prolonged  standing requiring increased assistance to mod A to avoid loss of balance;   Patient reports increased fatigue at end of session; she denies any increase in pain;                      PT Education - 10/29/17 1400    Education provided  Yes    Education Details  balance/strengthening, HEP reinforced;     Person(s) Educated  Patient    Methods  Explanation;Verbal cues;Demonstration    Comprehension  Verbalized understanding;Returned demonstration;Verbal cues required;Need further instruction  PT Short Term Goals - 10/06/17 1445      PT SHORT TERM GOAL #1   Title  Patient will deny any falls over past 4 weeks to demonstrate improved safety awareness at home and work.     Baseline  still having multiple falls;     Time  4    Period  Weeks    Status  On-going    Target Date  11/03/17      PT SHORT TERM GOAL #2   Title  Pt will obtain and appropriately use LRAD (Rollator suggested) in order to decraese fall risk and increase community access.     Baseline  Pt has obtained Rollator for use outside    Time  2    Period  Weeks    Status  Achieved        PT Long Term Goals - 10/06/17 1448      PT LONG TERM GOAL #1   Title  Patient will be independent in home exercise program to improve strength/mobility for better functional independence with ADLs.    Baseline  Pt does part of her HEP about 3-4 times per week.    Time  4    Period  Weeks    Status  Partially Met    Target Date  11/03/17      PT LONG TERM GOAL #2   Title  Patient will increase Berg Balance score by > 6 points to demonstrate decreased fall risk during functional activities.; Deferred as patient's balance is more impaired dynamically and therefore FGA goal more appropriate;     Baseline  will defer at this time;     Time  4    Period  Weeks    Status  Deferred    Target Date  11/03/17      PT LONG TERM GOAL #3   Title  Patient (> 38 years old) will complete five times sit to stand test  in < 15 seconds indicating an increased LE strength and improved balance.    Baseline  18 sec without HHA    Time  4    Period  Weeks    Status  Partially Met    Target Date  11/03/17      PT LONG TERM GOAL #4   Title  Patient will ascend/descend 4 stairs without rail assist independently without loss of balance to improve ability to get in/out of home.     Baseline  able to negotiate steps one step at a time without rail assist;     Time  4    Period  Weeks    Status  Partially Met    Target Date  11/03/17      PT LONG TERM GOAL #5   Title  Patient will increase BLE gross strength to 4/5 as to improve functional strength for independent gait, increased standing tolerance and increased ADL ability.    Baseline  --    Time  4    Period  Weeks    Status  Partially Met    Target Date  11/03/17      PT LONG TERM GOAL #6   Title  Patient will increase Functional Gait Assessment score to >20/30 as to reduce fall risk and improve dynamic gait safety with community ambulation.    Baseline  19/30    Time  4    Period  Weeks    Status  Partially Met    Target Date  11/03/17            Plan - 10/29/17 1519    Clinical Impression Statement  Patient exhibits less unsteadiness today with better upper trunk control and weight shift. She requires less assistance with balance tasks with less posterior loss of balance. Patient does require cues to stay on task as she is often distracted; Patient instructed in advanced LE strengthening with increased resistance with less discomfort: patient would benefit from additional skilled PT intervention to improve strength, balance and gait safety;     Rehab Potential  Fair    Clinical Impairments Affecting Rehab Potential  Pt does not use AD and had decreased knowledge of her limitations    PT Frequency  2x / week    PT Duration  4 weeks    PT Treatment/Interventions  ADLs/Self Care Home Management;Aquatic Therapy;Biofeedback;Canalith  Repostioning;Cryotherapy;Moist Heat;Cognitive remediation;Neuromuscular re-education;Balance training;Therapeutic exercise;Therapeutic activities;Functional mobility training;Stair training;Gait training;DME Instruction;Patient/family education;Orthotic Fit/Training;Manual techniques;Passive range of motion;Visual/perceptual remediation/compensation;Vestibular;Energy conservation;Dry needling    PT Home Exercise Plan  continue as given;     Consulted and Agree with Plan of Care  Patient       Patient will benefit from skilled therapeutic intervention in order to improve the following deficits and impairments:  Abnormal gait, Decreased activity tolerance, Decreased balance, Decreased cognition, Decreased coordination, Decreased safety awareness, Decreased mobility, Decreased knowledge of use of DME, Decreased knowledge of precautions, Decreased endurance, Decreased skin integrity, Decreased strength, Difficulty walking, Dizziness, Postural dysfunction, Improper body mechanics, Impaired vision/preception, Impaired UE functional use, Impaired sensation, Hypomobility, Decreased range of motion, Impaired flexibility, Pain  Visit Diagnosis: Other lack of coordination  Other abnormalities of gait and mobility  Muscle weakness (generalized)     Problem List Patient Active Problem List   Diagnosis Date Noted  . Lumbar facet joint syndrome (Primary Area of Pain) (Bilateral) (L>R) 07/29/2017  . Chronic sacroiliac joint pain (Bilateral) (L>R) 07/29/2017  . Chronic hip pain (Bilateral) (L>R) 07/29/2017  . Chronic lower extremity pain (Secondary Area of Pain) (Bilateral) (L>R) 07/29/2017  . Impairment of balance 07/07/2017  . Menopausal and female climacteric states 06/15/2017  . Lumbar foraminal stenosis (T12-S1, multilevel) 06/10/2017  . Lumbar lateral recess stenosis (left L2-3, bilateral L3-4, & right L4-5) 06/10/2017  . Grade 1 Anterolisthesis of L4 over L5 and L5 over S1 06/10/2017  . Lumbar  facet hypertrophy (multilevel) 06/10/2017  . Lumbar Ligamentum flavum hypertrophy (HCC) (multilevel) 06/10/2017  . Dropfoot (Left) (L5 radiculopathy) 06/10/2017  . Neurogenic pain 06/09/2017  . DDD (degenerative disc disease), lumbar 06/09/2017  . 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 Closed fracture of metatarsal bone 05/06/2017  . 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 Abnormal MRI, cervical spine 05/06/2017  . 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 Cervical radiculitis 05/06/2017  . Chronic lumbar radiculopathy (Bilateral) (L>R) (left L5) 05/06/2017  . Chronic low back pain (Primary Area of Pain) (Bilateral) (L>R) 05/06/2017  . Abnormal MRI, lumbar spine 05/06/2017  . Chronic pain syndrome 03/19/2017  . Chronic shoulder pain (Fourth Area of Pain) (Bilateral) (L>R) 03/19/2017  . Osteoarthritis of shoulder (Bilateral) 03/19/2017  . Chronic neck pain [facility] Area of Pain) (Bilateral) (L>R) 03/19/2017  . History of C3-5 ACDF 03/19/2017  . Numbness and tingling in left upper extremity 03/19/2017  . Numbness and tingling of right upper extremity 03/19/2017  . Upper extremity weakness (Bilateral) (L>R) 03/19/2017  . Numbness of upper extremity (Bilateral) (L>R)  03/19/2017  . Numbness and tingling of lower extremity (Bilateral) (L>R) 03/19/2017  . Weakness of lower extremity (Bilateral) (R>L) 03/19/2017  . Chronic abdominal pain (epigastric) 03/19/2017  . Pituitary microadenoma ([place]) 12/08/2016  . History of breast cancer in adulthood 12/08/2016  . Dysmetria 12/08/2016  . Loss of weight 08/15/2015  . Colon polyp 05/25/2015  . Personal history of malignant neoplasm of breast 07/18/2014  . Breast cancer ([place]) 06/28/2014  . Bipolar disorder ([place]) 06/28/2014  . Chronic pancreatitis ([place]) 06/28/2014  . HTN (hypertension) 06/28/2014  . OA (osteoarthritis)  06/28/2014  . Renal insufficiency 06/28/2014  . DDD (degenerative disc disease), cervical 06/28/2014  . Hyperlipidemia 06/28/2014  . COPD (chronic obstructive pulmonary disease) ([place]) 05/17/2014  . Breast cancer of upper-outer quadrant of right female breast ([place]) 05/31/2008    Cleotha Tsang PT, DPT 10/29/2017, 3:20 PM  [place] MAIN [facility] SERVICES [address] [place], [place], [address] Phone: [phone]   Fax:  [phone]  Name: Elaine Clayton MRN: [id number] Date of Birth: 07/09/1946

## 2017-11-02 ENCOUNTER — Encounter: Payer: Self-pay | Admitting: Pain Medicine

## 2017-11-02 ENCOUNTER — Ambulatory Visit: Payer: Medicare Other | Attending: Pain Medicine | Admitting: Pain Medicine

## 2017-11-02 ENCOUNTER — Other Ambulatory Visit: Payer: Self-pay

## 2017-11-02 ENCOUNTER — Ambulatory Visit: Payer: Medicare Other | Admitting: Physical Therapy

## 2017-11-02 VITALS — BP 145/94 | HR 88 | Temp 98.8°F | Resp 18 | Ht 64.0 in | Wt 102.0 lb

## 2017-11-02 DIAGNOSIS — N189 Chronic kidney disease, unspecified: Secondary | ICD-10-CM | POA: Diagnosis not present

## 2017-11-02 DIAGNOSIS — J449 Chronic obstructive pulmonary disease, unspecified: Secondary | ICD-10-CM | POA: Diagnosis not present

## 2017-11-02 DIAGNOSIS — I129 Hypertensive chronic kidney disease with stage 1 through stage 4 chronic kidney disease, or unspecified chronic kidney disease: Secondary | ICD-10-CM | POA: Diagnosis not present

## 2017-11-02 DIAGNOSIS — F319 Bipolar disorder, unspecified: Secondary | ICD-10-CM | POA: Insufficient documentation

## 2017-11-02 DIAGNOSIS — M25552 Pain in left hip: Secondary | ICD-10-CM | POA: Diagnosis not present

## 2017-11-02 DIAGNOSIS — M5441 Lumbago with sciatica, right side: Secondary | ICD-10-CM

## 2017-11-02 DIAGNOSIS — Z79899 Other long term (current) drug therapy: Secondary | ICD-10-CM | POA: Insufficient documentation

## 2017-11-02 DIAGNOSIS — M533 Sacrococcygeal disorders, not elsewhere classified: Secondary | ICD-10-CM | POA: Diagnosis not present

## 2017-11-02 DIAGNOSIS — M21372 Foot drop, left foot: Secondary | ICD-10-CM | POA: Diagnosis not present

## 2017-11-02 DIAGNOSIS — M48061 Spinal stenosis, lumbar region without neurogenic claudication: Secondary | ICD-10-CM | POA: Insufficient documentation

## 2017-11-02 DIAGNOSIS — Z7982 Long term (current) use of aspirin: Secondary | ICD-10-CM | POA: Diagnosis not present

## 2017-11-02 DIAGNOSIS — G8929 Other chronic pain: Secondary | ICD-10-CM | POA: Diagnosis not present

## 2017-11-02 DIAGNOSIS — M25551 Pain in right hip: Secondary | ICD-10-CM | POA: Insufficient documentation

## 2017-11-02 DIAGNOSIS — R531 Weakness: Secondary | ICD-10-CM | POA: Insufficient documentation

## 2017-11-02 DIAGNOSIS — M5442 Lumbago with sciatica, left side: Secondary | ICD-10-CM | POA: Diagnosis not present

## 2017-11-02 DIAGNOSIS — Z87891 Personal history of nicotine dependence: Secondary | ICD-10-CM | POA: Diagnosis not present

## 2017-11-02 DIAGNOSIS — R29898 Other symptoms and signs involving the musculoskeletal system: Secondary | ICD-10-CM | POA: Diagnosis not present

## 2017-11-02 DIAGNOSIS — G894 Chronic pain syndrome: Secondary | ICD-10-CM | POA: Diagnosis not present

## 2017-11-02 DIAGNOSIS — M5116 Intervertebral disc disorders with radiculopathy, lumbar region: Secondary | ICD-10-CM | POA: Diagnosis not present

## 2017-11-02 DIAGNOSIS — M47816 Spondylosis without myelopathy or radiculopathy, lumbar region: Secondary | ICD-10-CM

## 2017-11-02 DIAGNOSIS — Z5181 Encounter for therapeutic drug level monitoring: Secondary | ICD-10-CM | POA: Diagnosis present

## 2017-11-02 DIAGNOSIS — Z853 Personal history of malignant neoplasm of breast: Secondary | ICD-10-CM | POA: Diagnosis not present

## 2017-11-02 DIAGNOSIS — M47896 Other spondylosis, lumbar region: Secondary | ICD-10-CM

## 2017-11-02 DIAGNOSIS — M4804 Spinal stenosis, thoracic region: Secondary | ICD-10-CM | POA: Insufficient documentation

## 2017-11-02 DIAGNOSIS — E785 Hyperlipidemia, unspecified: Secondary | ICD-10-CM | POA: Diagnosis not present

## 2017-11-02 DIAGNOSIS — M792 Neuralgia and neuritis, unspecified: Secondary | ICD-10-CM

## 2017-11-02 DIAGNOSIS — D352 Benign neoplasm of pituitary gland: Secondary | ICD-10-CM | POA: Diagnosis not present

## 2017-11-02 DIAGNOSIS — R2689 Other abnormalities of gait and mobility: Secondary | ICD-10-CM | POA: Diagnosis not present

## 2017-11-02 MED ORDER — HYDROCODONE-ACETAMINOPHEN 5-325 MG PO TABS: 1.0000 | ORAL_TABLET | Freq: Four times a day (QID) | ORAL | 0 refills | Status: DC | PRN

## 2017-11-02 NOTE — Progress Notes (Signed)
Patient's Name: Elaine Clayton  MRN: [id number]  Referring Provider: Idelle Crouch, MD  DOB: 1946/06/19  PCP: Idelle Crouch, MD  DOS: 11/02/2017  Note by: Gaspar Cola, MD  Service setting: Ambulatory outpatient  Specialty: Interventional Pain Management  Location: [facility] (AMB) Pain Management Facility    Patient type: Established   Primary Reason(s) for Visit: Encounter for prescription drug management & post-procedure evaluation of chronic illness with mild to moderate exacerbation(Level of risk: moderate) CC: Back Pain (lower)  HPI  Elaine Clayton is a 71 y.o. year old, female patient, who comes today for a post-procedure evaluation and medication management. She has Breast cancer of upper-outer quadrant of right female breast ([place]); Personal history of malignant neoplasm of breast; Colon polyp; Loss of weight; Pituitary microadenoma ([place]); History of breast cancer in adulthood; Dysmetria; Breast cancer ([place]); Bipolar disorder ([place]); Chronic pancreatitis ([place]); COPD (chronic obstructive pulmonary disease) (HCC); HTN (hypertension); OA (osteoarthritis); Renal insufficiency; Chronic pain syndrome; Chronic shoulder pain (Fourth Area of Pain) (Bilateral) (L>R); Osteoarthritis of shoulder (Bilateral); Chronic neck pain (Tertiary Area of Pain) (Bilateral) (L>R); History of C3-5 ACDF; Numbness and tingling in left upper extremity; Numbness and tingling of right upper extremity; Upper extremity weakness (Bilateral) (L>R); Numbness of upper extremity (Bilateral) (L>R); Numbness and tingling of lower extremity (Bilateral) (L>R); Weakness of lower extremity (Bilateral) (R>L); Chronic abdominal pain (epigastric); Closed fracture of metatarsal bone; 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); 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); Abnormal MRI, cervical spine; 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[place]); Cervical radiculitis; Chronic lumbar radiculopathy (Bilateral) (L>R) (left L5); Chronic low back  pain (Primary Area of Pain) (Bilateral) (L>R); Abnormal MRI, lumbar spine; 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); DDD (degenerative disc disease), cervical; Neurogenic pain; DDD (degenerative disc disease), lumbar; Lumbar foraminal stenosis (T12-S1, multilevel); Lumbar lateral recess stenosis (left L2-3, bilateral L3-4, & right L4-5); Grade 1 Anterolisthesis of L4 over L5 and L5 over S1; Lumbar facet hypertrophy (multilevel); Lumbar Ligamentum flavum hypertrophy (HCC) (multilevel); Dropfoot (Left) (L5 radiculopathy); Menopausal and female climacteric states; Impairment of balance; Lumbar facet joint syndrome (Primary Area of Pain) (Bilateral) (L>R); Chronic sacroiliac joint pain (Bilateral) (L>R); Chronic hip pain (Bilateral) (L>R); Chronic lower extremity pain (Secondary Area of Pain) (Bilateral) (L>R); and Hyperlipidemia on their problem list. Her primarily concern today is the Back Pain (lower)  Pain Assessment: Location: Lower Back Radiating: numbness and tingling in left leg Duration: Chronic pain Quality: Tingling, Numbness Severity: 6 /10 (self-reported pain score)  Note: Reported level is inconsistent with clinical observations. Clinically the patient looks like a 3/10 A 3/10 is viewed as "Moderate" and described as significantly interfering with activities of daily living (ADL). It becomes difficult to feed, bathe, get dressed, get on and off the toilet or to perform personal hygiene functions. Difficult to get in and out of bed or a chair without assistance. Very distracting. With effort, it can be ignored when deeply involved in activities.       When using our objective Pain Scale, levels between 6 and 10/10 are said to belong in an emergency room, as it progressively worsens from a 6/10, described as severely limiting, requiring emergency care not usually available at an outpatient pain management facility. At a 6/10 level, communication becomes difficult and requires great  effort. Assistance to reach the emergency department may be required. Facial flushing and profuse sweating along with potentially dangerous increases in heart rate and blood pressure will be evident. Timing: Intermittent Modifying factors: heating pad  Elaine Clayton  was last seen on 10/15/2017 for a procedure. During today's appointment we reviewed Elaine Clayton's post-procedure results, as well as her outpatient medication regimen.  She reports to continue experiencing falls.  By her own accounts, she does not want anybody to help her and she realizes that this is the reason why she tends to fall frequently.  She also indicates that the physical therapy has helped regain some strength in the lower extremities but she admits that she is far from normal.  She is concerned about the possibility of having multiple sclerosis but she does not want me to refer her to a neurologist.  She admits to having back pain but denies any ocular pain.  She denies any tremors but does admit to cramping with difficulty walking and inability to rapidly change motions.  She denies any muscle paralysis or rigidity, but does admit to muscle weakness and problems with coordination.  She denies any muscle stiffness but does admit to clumsiness.  She also admits to fatigue, occasional dizziness, and having poor balance and occasionally vertigo.  She does admit to occasional pins and needles as well as abnormalities in taste with reduced sensation of touch, especially over her left leg and she does admit to uncomfortable tingling and burning sensations.  She does admit to excessive urination at night, leaking of urine, and occasional inability to control her bladder.  She does admit to blurred vision but denies any double vision or loss of vision.  She will not admit to anxiety or mood swings.  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  She also  admits to difficulty swallowing, thinking, and occasionally understanding.  She admits to heaviness in her lower extremities and weakness of both upper and lower extremities.  Based on these symptoms which are usually associated multiple sclerosis, I do believe that she would benefit from a workup from a neurologist.  I have explained to the patient that I am not an expert in the field and that I would like to have her see a neurologist, however, she has turned down my offer.  Today I attempted to order an MS panel, just learned that he had required a CSF sample, which she did not want to have drawn today.  In fact, she is not too keen on having a spinal tap.  I will try to bring this up again in the future when her husband is present to see if we can have her reconsider it.  I have known Ms. Salser for a while, and I have noticed a deterioration in her memory as well as some progressive cognitive impairment.  Further details on both, my assessment(s), as well as the proposed treatment plan, please see below.  Controlled Substance Pharmacotherapy Assessment REMS (Risk Evaluation and Mitigation Strategy)  Analgesic: Hydrocodone/APAP 5/325 1 tablet p.o. every 6 hours as needed for pain (20 mg/day of hydrocodone) (20 MME/day) MME/day: 20 mg/day.  Landis Martins, RN  11/02/2017  1:54 PM  Sign at close encounter Safety precautions to be maintained throughout the outpatient stay will include: orient to surroundings, keep bed in low position, maintain call bell within reach at all times, provide assistance with transfer out of bed and ambulation.    Pharmacokinetics: Liberation and absorption (onset of action): WNL Distribution (time to peak effect): WNL Metabolism and excretion (duration of action): WNL         Pharmacodynamics: Desired effects:  Analgesia: Ms. Wyke reports >50% benefit. Functional ability: Patient reports that medication allows her to accomplish basic ADLs Clinically meaningful  improvement in function (CMIF): Sustained CMIF goals met Perceived effectiveness: Described as relatively effective, allowing for increase in activities of daily living (ADL) Undesirable effects: Side-effects or Adverse reactions: None reported Monitoring: [place] PMP: Online review of the past 43-monthperiod conducted. Compliant with practice rules and regulations Last UDS on record: No results found for: SUMMARY UDS interpretation: Compliant          Medication Assessment Form: Reviewed. Patient indicates being compliant with therapy Treatment compliance: Compliant Risk Assessment Profile: Aberrant behavior: See prior evaluations. None observed or detected today Comorbid factors increasing risk of overdose: See prior notes. No additional risks detected today Risk of substance use disorder (SUD): Low Opioid Risk Tool - 09/09/17 0914      Family History of Substance Abuse   Alcohol  Negative    Illegal Drugs  Negative    Rx Drugs  Negative      Personal History of Substance Abuse   Alcohol  Negative    Illegal Drugs  Negative    Rx Drugs  Negative      Age   Age between 160-45years   No      History of Preadolescent Sexual Abuse   History of Preadolescent Sexual Abuse  Negative or Female      Psychological Disease   Psychological Disease  Negative    Depression  Negative      Total Score   Opioid Risk Tool Scoring  0    Opioid Risk Interpretation  Low Risk      ORT Scoring interpretation table:  Score <3 = Low Risk for SUD  Score between 4-7 = Moderate Risk for SUD  Score >8 = High Risk for Opioid Abuse   Risk Mitigation Strategies:  Patient Counseling: Covered Patient-Prescriber Agreement (PPA): Present and active  Notification to other healthcare providers: Done  Pharmacologic Plan: No change in therapy, at this time  Post-Procedure Assessment  10/15/2017 Procedure: Diagnostic bilateral lumbar facet block + bilateral sacroiliac joint block under fluoroscopic  guidance and IV sedation Pre-procedure pain score:  6/10 Post-procedure pain score: 6/10 No relief Influential Factors: BMI: 17.51 kg/m Intra-procedural challenges: None observed.         Assessment challenges: None detected.              Reported side-effects: None.        Post-procedural adverse reactions or complications: None reported         Sedation: Sedation provided. When no sedatives are used, the analgesic levels obtained are directly associated to the effectiveness of the local anesthetics. However, when sedation is provided, the level of analgesia obtained during the initial 1 hour following the intervention, is believed to be the result of a combination of factors. These factors may include, but are not limited to: 1. The effectiveness of the local anesthetics used. 2. The effects of the analgesic(s) and/or anxiolytic(s) used. 3. The degree of discomfort experienced by the patient at the time of the procedure. 4. The patients ability and reliability in recalling and recording the events. 5. The presence and influence of possible secondary gains and/or psychosocial factors. Reported result: Relief experienced during the 1st hour after the procedure: 100 % (Ultra-Short Term Relief) Ms. Lippens has indicated area to have been numb during this time. Interpretative annotation: Clinically appropriate result. Analgesia during this period is likely to be Local Anesthetic and/or  IV Sedative (Analgesic/Anxiolytic) related.          Effects of local anesthetic: The analgesic effects attained during this period are directly associated to the localized infiltration of local anesthetics and therefore cary significant diagnostic value as to the etiological location, or anatomical origin, of the pain. Expected duration of relief is directly dependent on the pharmacodynamics of the local anesthetic used. Long-acting (4-6 hours) anesthetics used.  Reported result: Relief during the next 4 to 6 hour  after the procedure: 100 % (Short-Term Relief) Ms. Garciagarcia has indicated area to have been numb during this time. Interpretative annotation: Clinically appropriate result. Analgesia during this period is likely to be Local Anesthetic-related.          Long-term benefit: Defined as the period of time past the expected duration of local anesthetics (1 hour for short-acting and 4-6 hours for long-acting). With the possible exception of prolonged sympathetic blockade from the local anesthetics, benefits during this period are typically attributed to, or associated with, other factors such as analgesic sensory neuropraxia, antiinflammatory effects, or beneficial biochemical changes provided by agents other than the local anesthetics.  Reported result: Extended relief following procedure: 30 % (Long-Term Relief) Ms. Leeder reports the axial pain improved more than the extremity pain. Interpretative annotation: Clinically appropriate result. Partial relief. No permanent benefit expected. Inflammation plays a part in the etiology to the pain.          Current benefits: Defined as reported results that persistent at this point in time.   Analgesia: <25 %            Function: Somewhat improved ROM: Somewhat improved Interpretative annotation: Recurrence of symptoms. No permanent benefit expected. Effective diagnostic intervention.          Interpretation: Results would suggest a successful diagnostic intervention. The patient has failed to respond to conservative therapies including over-the-counter medications, anti-inflammatories, muscle relaxants, membrane stabilizers, opioids, physical therapy modalities such as heat and ice, as well as more invasive techniques such as nerve blocks. Because Ms. Bruso did attain more than 50% relief of the pain during a series of diagnostic blocks conducted in separate occasions, I believe it is medically necessary to proceed with Radiofrequency Ablation, in order to  attempt gaining longer relief. Ms. Yurick indicates having had an unsuccessful trial of physical therapy, which she described as non-beneficial and painful.  However, she does admit increasing strength of the lower extremities.  Plan:  Proceed with Radiofrequency Ablation for the purpose of attaining long-term benefits.        Laboratory Chemistry  Inflammation Markers (CRP: Acute Phase) (ESR: Chronic Phase) Lab Results  Component Value Date   CRP <0.8 03/19/2017   ESRSEDRATE 4 03/19/2017                 Renal Function Markers Lab Results  Component Value Date   BUN 12 03/19/2017   CREATININE 0.99 03/19/2017   GFRAA >60 03/19/2017   GFRNONAA 57 (L) 03/19/2017                 Hepatic Function Markers Lab Results  Component Value Date   AST 22 03/19/2017   ALT 14 03/19/2017   ALBUMIN 4.5 03/19/2017   ALKPHOS 53 03/19/2017                 Electrolytes Lab Results  Component Value Date   NA 139 03/19/2017   K 4.0 03/19/2017   CL 107 03/19/2017   CALCIUM 9.5 03/19/2017  MG 2.1 03/19/2017                 Neuropathy Markers Lab Results  Component Value Date   VITAMINB12 558 03/19/2017                 Bone Pathology Markers Lab Results  Component Value Date   ALKPHOS 53 03/19/2017   25OHVITD1 57 03/19/2017   25OHVITD2 1.2 03/19/2017   25OHVITD3 56 03/19/2017   CALCIUM 9.5 03/19/2017                 Rheumatology Markers No results found for: [facility]              Coagulation Parameters Lab Results  Component Value Date   PLT 206 06/25/2015                 Cardiovascular Markers Lab Results  Component Value Date   HGB 10.0 (L) 06/25/2015   HCT 31.1 (L) 06/25/2015                 CA Markers No results found for: CEA, CA125, LABCA2               Note: Lab results reviewed.  Recent Diagnostic Imaging Results  DG Si Joints CLINICAL DATA:  Pt has had left hip pain for months now but has increased. Right hip pain hurts at times but left is worse. Pt  has had previous SI joint pain around 15 to 20 years ago. Previous discogram.  EXAM: BILATERAL SACROILIAC JOINTS - 3+ VIEW  COMPARISON:  None.  FINDINGS: No fracture or bone lesion.  Both SI joints are normally spaced and aligned. No arthropathic changes.  Left hip joints are normally spaced and aligned. There are minor marginal osteophytes along the base of the left femoral head. No other arthropathic change.  Bones are demineralized.  There is a bowel anastomosis staple line projecting over the right mid pelvis. Scattered iliac vascular calcifications are also noted.  IMPRESSION: 1. No fracture, bone lesion or SI joint abnormality. 2. Minor left hip joint degenerative change.  Electronically Signed   By: Lajean Manes M.D.   On: 07/29/2017 14:22 DG HIP UNILAT W OR W/O PELVIS 2-3 VIEWS RIGHT CLINICAL DATA:  Pain  EXAM: DG PELVIS AND BILATERAL HIP IMAGES: 4+ VIEWS  COMPARISON:  None.  FINDINGS: Frontal pelvis as well as frontal and lateral views of each hip -total five views -obtained. No fracture or dislocation. Joint spaces appear symmetric and unremarkable. No erosive change. There is osteoarthritic change in the visualized lower lumbar spine.  IMPRESSION: No fracture or dislocation. No evident arthropathic change in the hip joints. There is degenerative change in the visualized lower lumbar spine.  Electronically Signed   By: Lowella Grip III M.D.   On: 07/29/2017 14:22 DG HIP UNILAT W OR W/O PELVIS 2-3 VIEWS LEFT CLINICAL DATA:  Pain  EXAM: DG PELVIS AND BILATERAL HIP IMAGES: 4+ VIEWS  COMPARISON:  None.  FINDINGS: Frontal pelvis as well as frontal and lateral views of each hip -total five views -obtained. No fracture or dislocation. Joint spaces appear symmetric and unremarkable. No erosive change. There is osteoarthritic change in the visualized lower lumbar spine.  IMPRESSION: No fracture or dislocation. No evident arthropathic change  in the hip joints. There is degenerative change in the visualized lower lumbar spine.  Electronically Signed   By: Lowella Grip III M.D.   On: 07/29/2017 14:22  Complexity Note: Imaging results reviewed.  Results shared with Ms. Moehring, using Layman's terms.                         Meds   Current Outpatient Medications:  .  ALPRAZolam (XANAX) 0.5 MG tablet, Take 0.5 mg by mouth 2 (two) times daily as needed for sleep. 0.5-1 tablet, Disp: , Rfl:  .  aspirin EC 81 MG tablet, Take 1 tablet (81 mg total) by mouth daily., Disp: 90 tablet, Rfl:  .  AZOPT 1 % ophthalmic suspension, Place 1 drop into the left eye. , Disp: , Rfl: 0 .  bisacodyl (DULCOLAX) 5 MG EC tablet, Take 5 mg by mouth daily as needed for moderate constipation., Disp: , Rfl:  .  Brimonidine Tartrate-Timolol (COMBIGAN OP), Apply 1 drop to eye 2 (two) times daily., Disp: , Rfl:  .  buPROPion (WELLBUTRIN SR) 150 MG 12 hr tablet, Take 150 mg by mouth every morning. 2 tablets, Disp: , Rfl:  .  ciprofloxacin (CIPRO) 500 MG tablet, Take 1 tablet (500 mg total) by mouth every 12 (twelve) hours., Disp: 14 tablet, Rfl: 0 .  dextromethorphan 15 MG/5ML syrup, Take 10 mLs by mouth 4 (four) times daily as needed for cough., Disp: , Rfl:  .  escitalopram (LEXAPRO) 10 MG tablet, Take 10 mg by mouth daily. , Disp: , Rfl: 0 .  esomeprazole (NEXIUM) 40 MG capsule, Take 40 mg by mouth. , Disp: , Rfl:  .  estradiol-levonorgestrel (CLIMARA PRO) 0.045-0.015 MG/DAY, apply 1 patch every week, Disp: 12 patch, Rfl: 3 .  fluticasone furoate-vilanterol (BREO ELLIPTA) 100-25 MCG/INH AEPB, Inhale 1 puff into the lungs daily., Disp: 3 each, Rfl: 3 .  ipratropium (ATROVENT) 0.06 % nasal spray, Place 2 sprays into both nostrils 4 (four) times daily as needed for rhinitis., Disp: , Rfl:  .  Lido-Capsaicin-Men-Methyl Sal (1ST MEDX-PATCH/ LIDOCAINE) 4-0.025-5-20 % PTCH, Apply topically as needed., Disp: , Rfl:  .  losartan (COZAAR) 25 MG tablet, Take 25 mg  by mouth daily., Disp: , Rfl:  .  LUMIGAN 0.01 % SOLN, apply 1 drop to into both  eyes at bedtime once daily ONLY, Disp: , Rfl: 0 .  mirabegron ER (MYRBETRIQ) 25 MG TB24 tablet, Take 1 tablet (25 mg total) by mouth daily., Disp: 30 tablet, Rfl: 11 .  nicotine polacrilex (NICORETTE) 2 MG gum, Take 2 mg by mouth as needed for smoking cessation., Disp: , Rfl:  .  ondansetron (ZOFRAN) 4 MG tablet, , Disp: , Rfl: 0 .  ondansetron (ZOFRAN) 8 MG tablet, Take by mouth every 8 (eight) hours as needed for nausea or vomiting., Disp: , Rfl:  .  ranitidine (ZANTAC) 150 MG tablet, Take 150 mg by mouth daily., Disp: , Rfl:  .  rosuvastatin (CRESTOR) 10 MG tablet, Take 10 mg by mouth at bedtime. , Disp: , Rfl:  .  VENTOLIN HFA 108 (90 Base) MCG/ACT inhaler, inhale 2 puffs by mouth INTO THE LUNGS every 6 hours if needed for wheezing or shortness of breath, Disp: 18 g, Rfl: 6 .  gabapentin (NEURONTIN) 100 MG capsule, Take 1-3 capsules (100-300 mg total) by mouth at bedtime., Disp: 90 capsule, Rfl: 0 .  HYDROcodone-acetaminophen (NORCO/VICODIN) 5-325 MG tablet, Take 1 tablet every 6 (six) hours as needed by mouth for moderate pain., Disp: 60 tablet, Rfl: 0  ROS  Constitutional: Denies any fever or chills Gastrointestinal: No reported hemesis, hematochezia, vomiting, or acute GI distress Musculoskeletal: Denies any acute onset joint swelling, redness,  loss of ROM, or weakness Neurological: No reported episodes of acute onset apraxia, aphasia, dysarthria, agnosia, amnesia, paralysis, loss of coordination, or loss of consciousness  Allergies  Ms. Bing is allergic to influenza vaccines; flu virus vaccine; sulfa antibiotics; and tetracyclines & related.  [place]  Drug: Ms. Person  reports that she does not use drugs. Alcohol:  reports that she drinks alcohol. Tobacco:  reports that she quit smoking about 9 years ago. She has a 35.00 pack-year smoking history. she has never used smokeless tobacco. Medical:  has a  past medical history of Anemia, Bipolar affective disorder ([place]), Broken foot (2015), Chronic gastritis, Chronic kidney disease, COPD (chronic obstructive pulmonary disease) ([place]), Dizziness, Dysphagia, Glaucoma, Hyperlipemia, Hypertension, Lithium toxicity (2015), Malignant neoplasm of upper-outer quadrant of female breast [facility]) (May 07, 2007), Neck stiffness, Osteoarthritis, Pancreatitis, Personal history of malignant neoplasm of breast, Pituitary microadenoma ([place]), Recurrent UTI, Renal insufficiency, and Stomach ulcer (2112). Surgical: Ms. Bourbeau  has a past surgical history that includes Colonoscopy (2004); Neck surgery (2006); Eye surgery (2013); Appendectomy; Tubal ligation; ERCP (N/A); Glaucoma surgery (Right, 2013); cataract (Right, 2013); Breast surgery (Left,  2013); Breast surgery (Right, November 06, 1999); Breast surgery (Left, May 07, 2007); EUS (N/A, 2011, 2012); right breast cancer; Breast biopsy (Right, 2011); Breast excisional biopsy (Right, 2001); Breast excisional biopsy (Right, 2008); Breast excisional biopsy (Left, 10/26/2012); TRABECULECTOMY WITH [facility] AND [facility] (Left, 03/05/2016); CATARACT EXTRACTION PHACO AND INTRAOCULAR LENS PLACEMENT (IOC) (Left, 03/05/2016); LAPAROSCOPIC RIGHT COLECTOMY (Right, 06/22/2015); COLONOSCOPY (N/A, 05/14/2015); ESOPHAGOGASTRODUODENOSCOPY (EGD) (N/A, 05/14/2015); and SAVORY DILATION (N/A, 05/14/2015). Family: family history includes Breast cancer (age of onset: 29) in her mother.  Constitutional Exam  General appearance: Well nourished, well developed, and well hydrated. In no apparent acute distress Vitals:   11/02/17 1354  BP: (!) 145/94  Pulse: 88  Resp: 18  Temp: 98.8 F (37.1 C)  TempSrc: Oral  SpO2: 100%  Weight: 102 lb (46.3 kg)  Height: _0  (1.626 m)   BMI Assessment: Estimated body mass index is 17.51 kg/m as calculated from the following:   Height as of this encounter: _1  (1.626 m).   Weight as of this encounter: 102 lb (46.3  kg).  BMI interpretation table: BMI level Category Range association with higher incidence of chronic pain  <18 kg/m2 Underweight   18.5-24.9 kg/m2 Ideal body weight   25-29.9 kg/m2 Overweight Increased incidence by 20%  30-34.9 kg/m2 Obese (Class I) Increased incidence by 68%  35-39.9 kg/m2 Severe obesity (Class II) Increased incidence by 136%  >40 kg/m2 Extreme obesity (Class III) Increased incidence by 254%   BMI Readings from Last 4 Encounters:  11/02/17 17.51 kg/m  10/15/17 17.68 kg/m  09/23/17 17.68 kg/m  09/09/17 16.97 kg/m   Wt Readings from Last 4 Encounters:  11/02/17 102 lb (46.3 kg)  10/15/17 103 lb (46.7 kg)  09/23/17 103 lb (46.7 kg)  09/09/17 102 lb (46.3 kg)  Psych/Mental status: Alert, oriented x 3 (person, place, & time)       Eyes: PERLA Respiratory: No evidence of acute respiratory distress  Cervical Spine Area Exam  Skin & Axial Inspection: No masses, redness, edema, swelling, or associated skin lesions Alignment: Symmetrical Functional ROM: Unrestricted ROM      Stability: No instability detected Muscle Tone/Strength: Functionally intact. No obvious neuro-muscular anomalies detected. Sensory (Neurological): Unimpaired Palpation: No palpable anomalies              Upper Extremity (UE) Exam    Side: Right upper extremity  Side: Left upper extremity  Skin & Extremity Inspection: Skin color, temperature, and hair growth are WNL. No peripheral edema or cyanosis. No masses, redness, swelling, asymmetry, or associated skin lesions. No contractures.  Skin & Extremity Inspection: Skin color, temperature, and hair growth are WNL. No peripheral edema or cyanosis. No masses, redness, swelling, asymmetry, or associated skin lesions. No contractures.  Functional ROM: Unrestricted ROM          Functional ROM: Unrestricted ROM          Muscle Tone/Strength: Functionally intact. No obvious neuro-muscular anomalies detected.  Muscle Tone/Strength: Functionally intact.  No obvious neuro-muscular anomalies detected.  Sensory (Neurological): Unimpaired          Sensory (Neurological): Unimpaired          Palpation: No palpable anomalies              Palpation: No palpable anomalies              Specialized Test(s): Deferred         Specialized Test(s): Deferred          Thoracic Spine Area Exam  Skin & Axial Inspection: No masses, redness, or swelling Alignment: Symmetrical Functional ROM: Unrestricted ROM Stability: No instability detected Muscle Tone/Strength: Functionally intact. No obvious neuro-muscular anomalies detected. Sensory (Neurological): Unimpaired Muscle strength & Tone: No palpable anomalies  Lumbar Spine Area Exam  Skin & Axial Inspection: No masses, redness, or swelling Alignment: Symmetrical Functional ROM: Decreased ROM      Stability: No instability detected Muscle Tone/Strength: Functionally intact. No obvious neuro-muscular anomalies detected. Sensory (Neurological): Movement-associated pain Palpation: Complains of area being tender to palpation       Provocative Tests: Lumbar Hyperextension and rotation test: Positive bilaterally for facet joint pain. Lumbar Lateral bending test: evaluation deferred today       Patrick's Maneuver: Positive for bilateral S-I arthralgia              Gait & Posture Assessment  Ambulation: Patient ambulates using a cane Gait: Limited. Using assistive device to ambulate Posture: Antalgic   Lower Extremity Exam    Side: Right lower extremity  Side: Left lower extremity  Skin & Extremity Inspection: Skin color, temperature, and hair growth are WNL. No peripheral edema or cyanosis. No masses, redness, swelling, asymmetry, or associated skin lesions. No contractures.  Skin & Extremity Inspection: Skin color, temperature, and hair growth are WNL. No peripheral edema or cyanosis. No masses, redness, swelling, asymmetry, or associated skin lesions. No contractures.  Functional ROM: Unrestricted ROM           Functional ROM: Unrestricted ROM          Muscle Tone/Strength: Functionally intact. No obvious neuro-muscular anomalies detected.  Muscle Tone/Strength: Functionally intact. No obvious neuro-muscular anomalies detected.  Sensory (Neurological): Unimpaired  Sensory (Neurological): Unimpaired  Palpation: No palpable anomalies  Palpation: No palpable anomalies   Assessment  Primary Diagnosis & Pertinent Problem List: The primary encounter diagnosis was Chronic low back pain (Primary Area of Pain) (Bilateral) (L>R). Diagnoses of Chronic sacroiliac joint pain (Bilateral) (L>R), Lumbar facet joint syndrome (Primary Area of Pain) (Bilateral) (L>R), Lumbar facet hypertrophy (multilevel), Chronic pain syndrome, Impairment of balance, Neurogenic pain, Weakness of both lower extremities, and Upper extremity weakness (Bilateral) (L>R) were also pertinent to this visit.  Status Diagnosis  Persistent Persistent Persistent 1. Chronic low back pain (Primary Area of Pain) (Bilateral) (L>R)   2. Chronic sacroiliac joint pain (Bilateral) (L>R)   3. Lumbar  facet joint syndrome (Primary Area of Pain) (Bilateral) (L>R)   4. Lumbar facet hypertrophy (multilevel)   5. Chronic pain syndrome   6. Impairment of balance   7. Neurogenic pain   8. Weakness of both lower extremities   9. Upper extremity weakness (Bilateral) (L>R)     Problems updated and reviewed during this visit: No problems updated. Plan of Care  Pharmacotherapy (Medications Ordered): Meds ordered this encounter  Medications  . HYDROcodone-acetaminophen (NORCO/VICODIN) 5-325 MG tablet    Sig: Take 1 tablet every 6 (six) hours as needed by mouth for moderate pain.    Dispense:  60 tablet    Refill:  0    Fill one day early if pharmacy is closed on scheduled refill date. Do not fill until: 11/02/17 To last until: 12/02/17  This SmartLink is deprecated. Use AVSMEDLIST instead to display the medication list for a patient. Medications  administered today: Cherrelle L. Moccia had no medications administered during this visit.   Procedure Orders     Radiofrequency,Lumbar     Radiofrequency Sacroiliac Joint  Lab Orders     Multiple Sclerosis(MS) Profile Imaging Orders  No imaging studies ordered today   Referral Orders  No referral(s) requested today    Interventional management options: Planned, scheduled, and/or pending:   Schedule her for therapeutic left lumbar facet + left SI RFA under fluoro and IV sedation. Consider Spinal Tap for evaluation of MS.   Considering:   Diagnostic bilateral lumbar facet block Possible bilateral lumbar facet RFA  Diagnostic bilateral sacroiliac joint block  Possible bilateral sacroiliac joint RFA Diagnostic left cervical epidural steroid injection Diagnostic left L4-5 interlaminar lumbar epidural steroid injection Diagnostic left L5-S1 transforaminal epidural steroid injection    Palliative PRN treatment(s):   Palliative left cervical epidural steroid injection#2 Palliativeleft L4-5 interlaminar lumbar epidural steroid injection + left L5-S1 transforaminal #3   Provider-requested follow-up: Return for RFA (fluoro + sedation): (L) L-FCT + SI .  Future Appointments  Date Time Provider [place]  11/11/2017  1:00 PM Hillis Range, PT [facility] None  11/19/2017  1:00 PM Hillis Range, PT [facility] None  11/24/2017 10:30 AM Cleophus Molt E, PTA [facility] None  12/01/2017 10:30 AM Cleophus Molt E, PTA [facility] None  12/08/2017 10:30 AM Cleophus Molt E, PTA [facility] None  12/17/2017 10:15 AM Cleophus Molt E, PTA [facility] None  12/24/2017  1:30 PM McGowan, Hunt Oris, PA-C [facility] None  01/05/2018 10:30 AM Milinda Pointer, MD [facility] None  02/10/2018  1:30 PM Dohmeier, Asencion Partridge, MD [facility] None   Primary Care Physician: Idelle Crouch, MD Location: [facility] Note by: Gaspar Cola, MD Date: 11/02/2017; Time:  5:20 PM

## 2017-11-02 NOTE — Patient Instructions (Signed)

## 2017-11-02 NOTE — Progress Notes (Signed)
Safety precautions to be maintained throughout the outpatient stay will include: orient to surroundings, keep bed in low position, maintain call bell within reach at all times, provide assistance with transfer out of bed and ambulation.  

## 2017-11-11 ENCOUNTER — Ambulatory Visit: Payer: Medicare Other | Admitting: Physical Therapy

## 2017-11-19 ENCOUNTER — Ambulatory Visit: Payer: Medicare Other | Admitting: Physical Therapy

## 2017-11-20 DIAGNOSIS — R278 Other lack of coordination: Secondary | ICD-10-CM | POA: Insufficient documentation

## 2017-11-20 DIAGNOSIS — R4189 Other symptoms and signs involving cognitive functions and awareness: Secondary | ICD-10-CM | POA: Insufficient documentation

## 2017-11-20 DIAGNOSIS — G20 Parkinson's disease: Secondary | ICD-10-CM | POA: Insufficient documentation

## 2017-11-20 DIAGNOSIS — F482 Pseudobulbar affect: Secondary | ICD-10-CM | POA: Insufficient documentation

## 2017-11-20 DIAGNOSIS — R260 Ataxic gait: Secondary | ICD-10-CM | POA: Insufficient documentation

## 2017-11-20 DIAGNOSIS — R2681 Unsteadiness on feet: Secondary | ICD-10-CM | POA: Insufficient documentation

## 2017-11-24 ENCOUNTER — Ambulatory Visit: Payer: Medicare Other

## 2017-11-25 ENCOUNTER — Ambulatory Visit: Payer: Medicare Other | Attending: Pain Medicine | Admitting: Physical Therapy

## 2017-11-25 ENCOUNTER — Encounter: Payer: Self-pay | Admitting: Physical Therapy

## 2017-11-25 DIAGNOSIS — R2689 Other abnormalities of gait and mobility: Secondary | ICD-10-CM | POA: Diagnosis present

## 2017-11-25 DIAGNOSIS — R278 Other lack of coordination: Secondary | ICD-10-CM | POA: Diagnosis not present

## 2017-11-25 DIAGNOSIS — M6281 Muscle weakness (generalized): Secondary | ICD-10-CM | POA: Insufficient documentation

## 2017-11-25 NOTE — Therapy (Signed)
[place] MAIN [facility] SERVICES [address] [place], [place], [address] Phone: [phone]   Fax:  [phone]  Physical Therapy Treatment/Progress Note  Patient Details  Name: Elaine Clayton MRN: [id number] Date of Birth: 02/28/1946 Referring Provider: Dr. Joanna Hews    Encounter Date: 11/25/2017  PT End of Session - 11/25/17 1136    Visit Number  18    Number of Visits  41    Date for PT Re-Evaluation  01/20/18    Authorization Type  G-Code 8    PT Start Time  8341    PT Stop Time  1200    PT Time Calculation (min)  34 min    Equipment Utilized During Treatment  Gait belt    Activity Tolerance  Patient tolerated treatment well;No increased pain    Behavior During Therapy  WFL for tasks assessed/performed       Past Medical History:  Diagnosis Date  . Anemia   . Bipolar affective disorder ([place])    2  . Broken foot 2015   right foot  . Chronic gastritis   . Chronic kidney disease    Dr Holley Raring  . COPD (chronic obstructive pulmonary disease) ([place])   . Dizziness    inner ear (per pt) several times per week  . Dysphagia   . Glaucoma   . Hyperlipemia   . Hypertension    pt has recently come off BP meds.  MD aware. BP seems stable.  . Lithium toxicity 2015  . 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 Neck stiffness    s/p C3-C4 fusion, limited right turn  . Osteoarthritis    back  . Pancreatitis   . Personal history of malignant neoplasm of breast   . Pituitary microadenoma ([place])   . Recurrent UTI   . Renal insufficiency   . Stomach ulcer 2112    Past Surgical History:  Procedure Laterality Date  . APPENDECTOMY    . BREAST BIOPSY Right 2011   neg  . BREAST EXCISIONAL BIOPSY Right 2001   neg  . BREAST EXCISIONAL BIOPSY Right 2008   breast ca 2008 radiation  . BREAST EXCISIONAL BIOPSY Left 10/26/2012   neg  . BREAST SURGERY Left  2013    left breast wide excision,Intraductal papilloma, ductal hyperplasia and sclerosing adenosis. Microcalcifications associated with columnar cell change. No evidence of atypia or malignancy. Margins are unremarkable.  Marland Kitchen BREAST SURGERY Right November 06, 1999   multiple areas of microcalcification showing evidence of sclerosing adenosis and ductal hyperplasia.  Marland Kitchen BREAST SURGERY Left May 07, 2007   wide excision.  . cataract Right 2013  . CATARACT EXTRACTION W/PHACO Left 03/05/2016   Procedure: CATARACT EXTRACTION PHACO AND INTRAOCULAR LENS PLACEMENT (IOC);  Surgeon: Leandrew Koyanagi, MD;  Location: [place];  Service: Ophthalmology;  Laterality: Left;  . COLONOSCOPY  2004  . COLONOSCOPY N/A 05/14/2015   Procedure: COLONOSCOPY;  Surgeon: Manya Silvas, MD;  Location[facility] END[facility]COPY;  Service: Endoscopy;  Laterality: N/A;  . ERCP N/A   . ESOPHAGOGASTRODUODENOSCOPY N/A 05/14/2015   Procedure: ESOPHAGOGASTRODUODENOSCOPY (EGD);  Surgeon: Manya Silvas, MD;  Location: [facility] ENDOSCOPY;  Service: Endoscopy;  Laterality: N/A;  . EUS N/A 2011, 2012  . EYE SURGERY  2013  . GLAUCOMA SURGERY Right 2013  . LAPAROSCOPIC RIGHT COLECTOMY Right 06/22/2015   Procedure: LAPAROSCOPIC RIGHT COLECTOMY;  Surgeon: Robert Bellow, MD;  Location: [facility];  Service: General;  Laterality: Right;  . NECK SURGERY  2006  . right breast cancer    . SAVORY DILATION N/A 05/14/2015   Procedure: SAVORY DILATION;  Surgeon: Manya Silvas, MD;  Location: [facility] ENDOSCOPY;  Service: Endoscopy;  Laterality: N/A;  . TRABECULECTOMY Left 03/05/2016   Procedure: TRABECULECTOMY WITH [facility] AND [facility];  Surgeon: Leandrew Koyanagi, MD;  Location: [place];  Service: Ophthalmology;  Laterality: Left;  . TUBAL LIGATION      There were no vitals filed for this visit.  Subjective Assessment - 11/25/17 1131    Subjective  Patient reports increased soreness in left hip/low back today; she reports going to see  MD at [facility] and was diagnoed with Parkinsonism. Patient reports having to take vicoden in the morning to help with back pain; she reports, "In the morning its usually pretty good but by the evening I'm stumbling all over the place."     Pertinent History  71 yo Female with recent increase in back pain with subsequent numbness/tingling , weakness, and drop foot in B LEs, L>R starting about 3 months ago. She has had increased instability in gait and falls recently with minor injuries. Pt had recent epidural steroid injection L4-5, and L5-S1 with decrease in back pain since.Pt with extensive surgical and medical Hx including breast cancer (last surgery 2013), DJD with cervical fusion C3-5 in 2006, bipolar disorder, HTN, OA in bilateral shoulders L>R, hip and SIJ pain x >20 years.     Limitations  Lifting;Standing;Walking;House hold activities    How long can you sit comfortably?  Unlimited    How long can you stand comfortably?  20-30 minutes before balance gets worse    How long can you walk comfortably?  40' approx before drop foot or pain starts.     Diagnostic tests  MRI 05/20/17: Chronic lumbar scoliosis with mild grade 1 spondylolisthesis from L3 L4 to L5-S1. Advanced disc and facet degeneration at those levels. Mild to moderate spinal and foraminal stenosis at L3-L4 and L4-L5. Right L5 radiculitis. Moderate neural foraminal stenosis also at the left L1 and both L2 nerve levels.    Patient Stated Goals  To stop falling    Currently in Pain?  Yes    Pain Score  7     Pain Location  Back    Pain Orientation  Left;Lower    Pain Descriptors / Indicators  Aching;Sore    Pain Type  Chronic pain    Pain Radiating Towards  radiates into left leg;     Pain Onset  More than a month ago    Pain Frequency  Intermittent    Aggravating Factors   worse with prolonged car trips/standing /walking    Pain Relieving Factors  rest/heat/pain meds    Effect of Pain on Daily Activities  decreased activity tolerance      Multiple Pain Sites  No         [facility] PT Assessment - 11/25/17 0001      Standardized Balance Assessment   Five times sit to stand comments   23 sec without HHA (more impaired from 10/06/17 which was 18 sec without HHA, >15 sec indicates increased risk for falls;    10 Meter Walk  0.76 m/s without AD, shuffled steps, limited community ambulator;      Functional Gait  Assessment   Gait Level Surface  Walks 20 ft in less than 7 sec but greater than 5.5 sec, uses  assistive device, slower speed, mild gait deviations, or deviates 6-10 in outside of the 12 in walkway width.    Change in Gait Speed  Able to smoothly change walking speed without loss of balance or gait deviation. Deviate no more than 6 in outside of the 12 in walkway width.    Gait with Horizontal Head Turns  Performs head turns smoothly with slight change in gait velocity (eg, minor disruption to smooth gait path), deviates 6-10 in outside 12 in walkway width, or uses an assistive device.    Gait with Vertical Head Turns  Performs task with slight change in gait velocity (eg, minor disruption to smooth gait path), deviates 6 - 10 in outside 12 in walkway width or uses assistive device    Gait and Pivot Turn  Pivot turns safely within 3 sec and stops quickly with no loss of balance.    Step Over Obstacle  Is able to step over one shoe box (4.5 in total height) but must slow down and adjust steps to clear box safely. May require verbal cueing.    Gait with Narrow Base of Support  Ambulates less than 4 steps heel to toe or cannot perform without assistance.    Gait with Eyes Closed  Walks 20 ft, slow speed, abnormal gait pattern, evidence for imbalance, deviates 10-15 in outside 12 in walkway width. Requires more than 9 sec to ambulate 20 ft.    Ambulating Backwards  Walks 20 ft, slow speed, abnormal gait pattern, evidence for imbalance, deviates 10-15 in outside 12 in walkway width.    Steps  Alternating feet, must use rail.    Total  Score  17        TREATMENT: Warm up on Nustep BUE/BLE level 2 x4 min (unbilled);  Instructed patient in 10 meter walk, 5 times sit<>stand, Functional gait assessment etc to address goals;  Patient does require close supervision during dynamic walking; She exhibited less lateral loss of balance during functional gait assessment but does ambulate with slower gait speed;  Reinforced importance of HEP compliance for better strengthening and mobility;                    PT Education - 11/25/17 1136    Education provided  Yes    Education Details  balance/strengthening, HEP reinforced;     Person(s) Educated  Patient    Methods  Explanation;Demonstration;Verbal cues    Comprehension  Verbalized understanding;Returned demonstration;Verbal cues required;Need further instruction       PT Short Term Goals - 11/25/17 1141      PT SHORT TERM GOAL #1   Title  Patient will deny any falls over past 4 weeks to demonstrate improved safety awareness at home and work.     Baseline  still having multiple falls;     Time  4    Period  Weeks    Status  On-going    Target Date  12/23/17      PT SHORT TERM GOAL #2   Title  Pt will obtain and appropriately use LRAD (Rollator suggested) in order to decraese fall risk and increase community access.     Baseline  Pt has obtained Rollator for use outside    Time  2    Period  Weeks    Status  Achieved        PT Long Term Goals - 11/25/17 1141      PT LONG TERM GOAL #1   Title  Patient will be independent in home exercise program to improve strength/mobility for better functional independence with ADLs.    Baseline  Pt does part of her HEP about 3-4 times per week.    Time  8    Period  Weeks    Status  Partially Met    Target Date  01/20/18      PT LONG TERM GOAL #2   Title  Patient will increase Berg Balance score by > 6 points to demonstrate decreased fall risk during functional activities.; Deferred as patient's balance  is more impaired dynamically and therefore FGA goal more appropriate;     Baseline  will defer at this time;     Time  8    Period  Weeks    Status  Deferred    Target Date  01/20/18      PT LONG TERM GOAL #3   Title  Patient (> 8 years old) will complete five times sit to stand test in < 15 seconds indicating an increased LE strength and improved balance.    Baseline  18 sec without HHA    Time  8    Period  Weeks    Status  Partially Met    Target Date  01/20/18      PT LONG TERM GOAL #4   Title  Patient will ascend/descend 4 stairs without rail assist independently without loss of balance to improve ability to get in/out of home.     Baseline  able to negotiate steps one step at a time without rail assist;     Time  8    Period  Weeks    Status  Partially Met    Target Date  01/20/18      PT LONG TERM GOAL #5   Title  Patient will increase BLE gross strength to 4/5 as to improve functional strength for independent gait, increased standing tolerance and increased ADL ability.    Time  8    Period  Weeks    Status  Partially Met    Target Date  01/20/18      PT LONG TERM GOAL #6   Title  Patient will increase Functional Gait Assessment score to >20/30 as to reduce fall risk and improve dynamic gait safety with community ambulation.    Baseline  19/30    Time  8    Period  Weeks    Status  Partially Met    Target Date  01/20/18            Plan - 11/25/17 1140    Clinical Impression Statement  Patient late to session; Instructed patient in outcome measures to address progress towards goals; Patient has made improvement since starting therapy, however shows more impairement since October 2018. patient has missed multiple appointments due to confusion, other doctor appointments etc. Patient continues to have weakness in BLE. She would benefit from skilled PT intervention to improve strength and mobility; Recommend aquatic therapy for a few session to assess tolerance. In  addition to imbalance patient has chronic low back pain which is worsened with prolonged standing; Patient instructed in importance of compliance/adherence with HEP for better progress;     Rehab Potential  Fair    Clinical Impairments Affecting Rehab Potential  Pt does not use AD and had decreased knowledge of her limitations    PT Frequency  2x / week    PT Duration  8 weeks    PT Treatment/Interventions  ADLs/Self  Care Home Management;Aquatic Therapy;Biofeedback;Canalith Repostioning;Cryotherapy;Moist Heat;Cognitive remediation;Neuromuscular re-education;Balance training;Therapeutic exercise;Therapeutic activities;Functional mobility training;Stair training;Gait training;DME Instruction;Patient/family education;Orthotic Fit/Training;Manual techniques;Passive range of motion;Visual/perceptual remediation/compensation;Vestibular;Energy conservation;Dry needling    PT Home Exercise Plan  continue as given;     Consulted and Agree with Plan of Care  Patient       Patient will benefit from skilled therapeutic intervention in order to improve the following deficits and impairments:  Abnormal gait, Decreased activity tolerance, Decreased balance, Decreased cognition, Decreased coordination, Decreased safety awareness, Decreased mobility, Decreased knowledge of use of DME, Decreased knowledge of precautions, Decreased endurance, Decreased skin integrity, Decreased strength, Difficulty walking, Dizziness, Postural dysfunction, Improper body mechanics, Impaired vision/preception, Impaired UE functional use, Impaired sensation, Hypomobility, Decreased range of motion, Impaired flexibility, Pain  Visit Diagnosis: Other lack of coordination  Other abnormalities of gait and mobility  Muscle weakness (generalized)     Problem List Patient Active Problem List   Diagnosis Date Noted  . Lumbar facet joint syndrome (Primary Area of Pain) (Bilateral) (L>R) 07/29/2017  . Chronic sacroiliac joint pain  (Bilateral) (L>R) 07/29/2017  . Chronic hip pain (Bilateral) (L>R) 07/29/2017  . Chronic lower extremity pain (Secondary Area of Pain) (Bilateral) (L>R) 07/29/2017  . Impairment of balance 07/07/2017  . Menopausal and female climacteric states 06/15/2017  . Lumbar foraminal stenosis (T12-S1, multilevel) 06/10/2017  . Lumbar lateral recess stenosis (left L2-3, bilateral L3-4, & right L4-5) 06/10/2017  . Grade 1 Anterolisthesis of L4 over L5 and L5 over S1 06/10/2017  . Lumbar facet hypertrophy (multilevel) 06/10/2017  . Lumbar Ligamentum flavum hypertrophy (HCC) (multilevel) 06/10/2017  . Dropfoot (Left) (L5 radiculopathy) 06/10/2017  . Neurogenic pain 06/09/2017  . DDD (degenerative disc disease), lumbar 06/09/2017  . 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 Closed fracture of metatarsal bone 05/06/2017  . 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 Abnormal MRI, cervical spine 05/06/2017  . 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 Cervical radiculitis 05/06/2017  . Chronic lumbar radiculopathy (Bilateral) (L>R) (left L5) 05/06/2017  . Chronic low back pain (Primary Area of Pain) (Bilateral) (L>R) 05/06/2017  . Abnormal MRI, lumbar spine 05/06/2017  . Chronic pain syndrome 03/19/2017  . Chronic shoulder pain (Fourth Area of Pain) (Bilateral) (L>R) 03/19/2017  . Osteoarthritis of shoulder (Bilateral) 03/19/2017  . Chronic neck pain [facility] Area of Pain) (Bilateral) (L>R) 03/19/2017  . History of C3-5 ACDF 03/19/2017  . Numbness and tingling in left upper extremity 03/19/2017  . Numbness and tingling of right upper extremity 03/19/2017  . Upper extremity weakness (Bilateral) (L>R) 03/19/2017  . Numbness of upper extremity (Bilateral) (L>R) 03/19/2017  . Numbness and tingling of lower extremity (Bilateral) (L>R) 03/19/2017  . Weakness of lower extremity (Bilateral) (R>L)  03/19/2017  . Chronic abdominal pain (epigastric) 03/19/2017  . Pituitary microadenoma ([place]) 12/08/2016  . History of breast cancer in adulthood 12/08/2016  . Dysmetria 12/08/2016  . Loss of weight 08/15/2015  . Colon polyp 05/25/2015  . Personal history of malignant neoplasm of breast 07/18/2014  . Breast cancer ([place]) 06/28/2014  . Bipolar disorder ([place]) 06/28/2014  . Chronic pancreatitis ([place]) 06/28/2014  . HTN (hypertension) 06/28/2014  . OA (osteoarthritis) 06/28/2014  . Renal insufficiency 06/28/2014  . DDD (degenerative disc disease), cervical 06/28/2014  . Hyperlipidemia 06/28/2014  . COPD (chronic obstructive pulmonary disease) ([place]) 05/17/2014  . Breast cancer of upper-outer quadrant of right female breast ([place]) 05/31/2008    Sheryl Saintil PT, DPT 11/25/2017,  12:35 PM  [place] MAIN [facility] SERVICES [address] [place], [place], [address] Phone: [phone]   Fax:  [phone]  Name: JAMARIA AMBORN MRN: [id number] Date of Birth: [birth year]

## 2017-12-01 ENCOUNTER — Ambulatory Visit: Payer: Medicare Other

## 2017-12-08 ENCOUNTER — Ambulatory Visit: Payer: Medicare Other

## 2017-12-08 DIAGNOSIS — M6281 Muscle weakness (generalized): Secondary | ICD-10-CM

## 2017-12-08 DIAGNOSIS — R278 Other lack of coordination: Secondary | ICD-10-CM

## 2017-12-08 DIAGNOSIS — R2689 Other abnormalities of gait and mobility: Secondary | ICD-10-CM

## 2017-12-08 NOTE — Therapy (Signed)
[place] MAIN [facility] SERVICES [address] [place], [place], [address] Phone: [phone]   Fax:  [phone]  Physical Therapy Treatment  Patient Details  Name: Elaine Clayton MRN: [id number] Date of Birth: January 13, 1946 Referring Provider: Dr. Joanna Hews    Encounter Date: 12/08/2017  PT End of Session - 12/08/17 1354    Visit Number  19    Number of Visits  41    Date for PT Re-Evaluation  01/20/18    PT Start Time  1030    PT Stop Time  1120    PT Time Calculation (min)  50 min       Past Medical History:  Diagnosis Date  . Anemia   . Bipolar affective disorder ([place])    2  . Broken foot 2015   right foot  . Chronic gastritis   . Chronic kidney disease    Dr Holley Raring  . COPD (chronic obstructive pulmonary disease) ([place])   . Dizziness    inner ear (per pt) several times per week  . Dysphagia   . Glaucoma   . Hyperlipemia   . Hypertension    pt has recently come off BP meds.  MD aware. BP seems stable.  . Lithium toxicity 2015  . 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 Neck stiffness    s/p C3-C4 fusion, limited right turn  . Osteoarthritis    back  . Pancreatitis   . Personal history of malignant neoplasm of breast   . Pituitary microadenoma ([place])   . Recurrent UTI   . Renal insufficiency   . Stomach ulcer 2112    Past Surgical History:  Procedure Laterality Date  . APPENDECTOMY    . BREAST BIOPSY Right 2011   neg  . BREAST EXCISIONAL BIOPSY Right 2001   neg  . BREAST EXCISIONAL BIOPSY Right 2008   breast ca 2008 radiation  . BREAST EXCISIONAL BIOPSY Left 10/26/2012   neg  . BREAST SURGERY Left  2013   left breast wide excision,Intraductal papilloma, ductal hyperplasia and sclerosing adenosis. Microcalcifications associated with columnar cell change. No evidence of atypia or malignancy. Margins are unremarkable.  [facility] Kitchen BREAST SURGERY  Right November 06, 1999   multiple areas of microcalcification showing evidence of sclerosing adenosis and ductal hyperplasia.  Marland Kitchen BREAST SURGERY Left May 07, 2007   wide excision.  . cataract Right 2013  . CATARACT EXTRACTION W/PHACO Left 03/05/2016   Procedure: CATARACT EXTRACTION PHACO AND INTRAOCULAR LENS PLACEMENT (IOC);  Surgeon: Leandrew Koyanagi, MD;  Location: [facility];  Service: Ophthalmology;  Laterality: Left;  . COLONOSCOPY  2004  . COLONOSCOPY N/A 05/14/2015   Procedure: COLONOSCOPY;  Surgeon: Manya Silvas, MD;  Location: [facility] ENDOSCOPY;  Service: Endoscopy;  Laterality: N/A;  . ERCP N/A   . ESOPHAGOGASTRODUODENOSCOPY N/A 05/14/2015   Procedure: ESOPHAGOGASTRODUODENOSCOPY (EGD);  Surgeon: Manya Silvas, MD;  Location: [facility] ENDOSCOPY;  Service: Endoscopy;  Laterality: N/A;  . EUS N/A 2011, 2012  . EYE SURGERY  2013  . GLAUCOMA SURGERY Right 2013  . LAPAROSCOPIC RIGHT COLECTOMY Right 06/22/2015   Procedure: LAPAROSCOPIC RIGHT COLECTOMY;  Surgeon: Robert Bellow, MD;  Location: [facility] ORS;  Service: General;  Laterality: Right;  . NECK SURGERY  2006  . right breast cancer    . SAVORY DILATION N/A 05/14/2015   Procedure: SAVORY DILATION;  Surgeon: Manya Silvas, MD;  Location:  [place] ENDOSCOPY;  Service: Endoscopy;  Laterality: N/A;  . TRABECULECTOMY Left 03/05/2016   Procedure: TRABECULECTOMY WITH [facility] AND [facility];  Surgeon: Leandrew Koyanagi, MD;  Location: [place];  Service: Ophthalmology;  Laterality: Left;  . TUBAL LIGATION      There were no vitals filed for this visit.  Subjective Assessment - 12/08/17 1349    Subjective  Pt reports moderate back pain currently more so on the left, but has taken medication, which has lasting effect until about 7 p.m. states pt. Pt notes LE weakness is a definite problem as well as balance noting good and bad days with balance. Lengthy discussion regarding assistive devices and which is most appropriate  during given scenarios and spaces. Pt does not have a rw; encouraged owning a rw for greater stability on  worse days and maneuvering tight spaces in the home, which pt notes as a problem with larger rollator.     Pertinent History  71 yo Female with recent increase in back pain with subsequent numbness/tingling , weakness, and drop foot in B LEs, L>R starting about 3 months ago. She has had increased instability in gait and falls recently with minor injuries. Pt had recent epidural steroid injection L4-5, and L5-S1 with decrease in back pain since.Pt with extensive surgical and medical Hx including breast cancer (last surgery 2013), DJD with cervical fusion C3-5 in 2006, bipolar disorder, HTN, OA in bilateral shoulders L>R, hip and SIJ pain x >20 years.       Pt enters/exits pool via ramp Participates in the following  Ambulation with kick boards for upper body support/posture - 4 L fwd - 4 L side  Abdominal stabilization with UE work, 20x  - sh flex/ext - sh abd/add - sh horiz abd/add - triceps press down with green dumbbells  High knee march with purple dumb bells (greater support), 2 L  Partial step up, BLE, 20x  Education on device use                        PT Education - 12/08/17 1353    Education provided  Yes    Education Details  Core stabilization, rw benefits/uses, aquatic environment and uses for progressing strength and balance.     Person(s) Educated  Patient       PT Short Term Goals - 11/25/17 1141      PT [place] #1   Title  Patient will deny any falls over past 4 weeks to demonstrate improved safety awareness at home and work.     Baseline  still having multiple falls;     Time  4    Period  Weeks    Status  On-going    Target Date  12/23/17      PT SHORT TERM GOAL #2   Title  Pt will obtain and appropriately use LRAD (Rollator suggested) in order to decraese fall risk and increase community access.     Baseline  Pt has  obtained Rollator for use outside    Time  2    Period  Weeks    Status  Achieved        PT Long Term Goals - 11/25/17 1141      PT LONG TERM GOAL #1   Title  Patient will be independent in home exercise program to improve strength/mobility for better functional independence with ADLs.    Baseline  Pt does part of her HEP about 3-4  times per week.    Time  8    Period  Weeks    Status  Partially Met    Target Date  01/20/18      PT LONG TERM GOAL #2   Title  Patient will increase Berg Balance score by > 6 points to demonstrate decreased fall risk during functional activities.; Deferred as patient's balance is more impaired dynamically and therefore FGA goal more appropriate;     Baseline  will defer at this time;     Time  8    Period  Weeks    Status  Deferred    Target Date  01/20/18      PT LONG TERM GOAL #3   Title  Patient (> 92 years old) will complete five times sit to stand test in < 15 seconds indicating an increased LE strength and improved balance.    Baseline  18 sec without HHA    Time  8    Period  Weeks    Status  Partially Met    Target Date  01/20/18      PT LONG TERM GOAL #4   Title  Patient will ascend/descend 4 stairs without rail assist independently without loss of balance to improve ability to get in/out of home.     Baseline  able to negotiate steps one step at a time without rail assist;     Time  8    Period  Weeks    Status  Partially Met    Target Date  01/20/18      PT LONG TERM GOAL #5   Title  Patient will increase BLE gross strength to 4/5 as to improve functional strength for independent gait, increased standing tolerance and increased ADL ability.    Time  8    Period  Weeks    Status  Partially Met    Target Date  01/20/18      PT LONG TERM GOAL #6   Title  Patient will increase Functional Gait Assessment score to >20/30 as to reduce fall risk and improve dynamic gait safety with community ambulation.    Baseline  19/30    Time  8     Period  Weeks    Status  Partially Met    Target Date  01/20/18            Plan - 12/08/17 1354    Clinical Impression Statement  Pt requires continual instruction on slowing activity/exercise for control and benefits of stabilization exercises as well as techniques for proper muscle recruitment and safety. Pt attests to being a Type A personality with 2 speeds: fast and stop. Will continue to work on proper form, technique and speed for max benefit of aquatic program. Definite weakness throughout core and LEs    Rehab Potential  Fair    Clinical Impairments Affecting Rehab Potential  Pt does not use AD and had decreased knowledge of her limitations    PT Frequency  2x / week    PT Duration  8 weeks    PT Treatment/Interventions  ADLs/Self Care Home Management;Aquatic Therapy;Biofeedback;Canalith Repostioning;Cryotherapy;Moist Heat;Cognitive remediation;Neuromuscular re-education;Balance training;Therapeutic exercise;Therapeutic activities;Functional mobility training;Stair training;Gait training;DME Instruction;Patient/family education;Orthotic Fit/Training;Manual techniques;Passive range of motion;Visual/perceptual remediation/compensation;Vestibular;Energy conservation;Dry needling    PT Home Exercise Plan  continue as given;     Consulted and Agree with Plan of Care  Patient       Patient will benefit from skilled therapeutic intervention in order to improve the following  deficits and impairments:  Abnormal gait, Decreased activity tolerance, Decreased balance, Decreased cognition, Decreased coordination, Decreased safety awareness, Decreased mobility, Decreased knowledge of use of DME, Decreased knowledge of precautions, Decreased endurance, Decreased skin integrity, Decreased strength, Difficulty walking, Dizziness, Postural dysfunction, Improper body mechanics, Impaired vision/preception, Impaired UE functional use, Impaired sensation, Hypomobility, Decreased range of motion,  Impaired flexibility, Pain  Visit Diagnosis: Other lack of coordination  Other abnormalities of gait and mobility  Muscle weakness (generalized)     Problem List Patient Active Problem List   Diagnosis Date Noted  . Lumbar facet joint syndrome (Primary Area of Pain) (Bilateral) (L>R) 07/29/2017  . Chronic sacroiliac joint pain (Bilateral) (L>R) 07/29/2017  . Chronic hip pain (Bilateral) (L>R) 07/29/2017  . Chronic lower extremity pain (Secondary Area of Pain) (Bilateral) (L>R) 07/29/2017  . Impairment of balance 07/07/2017  . Menopausal and female climacteric states 06/15/2017  . Lumbar foraminal stenosis (T12-S1, multilevel) 06/10/2017  . Lumbar lateral recess stenosis (left L2-3, bilateral L3-4, & right L4-5) 06/10/2017  . Grade 1 Anterolisthesis of L4 over L5 and L5 over S1 06/10/2017  . Lumbar facet hypertrophy (multilevel) 06/10/2017  . Lumbar Ligamentum flavum hypertrophy (HCC) (multilevel) 06/10/2017  . Dropfoot (Left) (L5 radiculopathy) 06/10/2017  . Neurogenic pain 06/09/2017  . DDD (degenerative disc disease), lumbar 06/09/2017  . 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 Closed fracture of metatarsal bone 05/06/2017  . 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 Abnormal MRI, cervical spine 05/06/2017  . 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[place]) 05/06/2017  . Cervical radiculitis 05/06/2017  . Chronic lumbar radiculopathy (Bilateral) (L>R) (left L5) 05/06/2017  . Chronic low back pain (Primary Area of Pain) (Bilateral) (L>R) 05/06/2017  . Abnormal MRI, lumbar spine 05/06/2017  . Chronic pain syndrome 03/19/2017  . Chronic shoulder pain (Fourth Area of Pain) (Bilateral) (L>R) 03/19/2017  . Osteoarthritis of shoulder (Bilateral) 03/19/2017  . Chronic neck pain [facility] Area of Pain) (Bilateral) (L>R) 03/19/2017  . History of C3-5 ACDF 03/19/2017  . Numbness and  tingling in left upper extremity 03/19/2017  . Numbness and tingling of right upper extremity 03/19/2017  . Upper extremity weakness (Bilateral) (L>R) 03/19/2017  . Numbness of upper extremity (Bilateral) (L>R) 03/19/2017  . Numbness and tingling of lower extremity (Bilateral) (L>R) 03/19/2017  . Weakness of lower extremity (Bilateral) (R>L) 03/19/2017  . Chronic abdominal pain (epigastric) 03/19/2017  . Pituitary microadenoma ([place]) 12/08/2016  . History of breast cancer in adulthood 12/08/2016  . Dysmetria 12/08/2016  . Loss of weight 08/15/2015  . Colon polyp 05/25/2015  . Personal history of malignant neoplasm of breast 07/18/2014  . Breast cancer ([place]) 06/28/2014  . Bipolar disorder ([place]) 06/28/2014  . Chronic pancreatitis ([place]) 06/28/2014  . HTN (hypertension) 06/28/2014  . OA (osteoarthritis) 06/28/2014  . Renal insufficiency 06/28/2014  . DDD (degenerative disc disease), cervical 06/28/2014  . Hyperlipidemia 06/28/2014  . COPD (chronic obstructive pulmonary disease) ([place]) 05/17/2014  . Breast cancer of upper-outer quadrant of right female breast ([place]) 05/31/2008    Larae Grooms 12/08/2017, 1:59 PM  [place] MAIN [facility] SERVICES [address] [place], [place], [address] Phone: [phone]   Fax:  [phone]  Name: Elaine Clayton MRN: [id number] Date of Birth: 1946/04/29

## 2017-12-17 ENCOUNTER — Ambulatory Visit: Payer: Medicare Other

## 2017-12-18 ENCOUNTER — Ambulatory Visit: Payer: Medicare Other | Admitting: Physical Therapy

## 2017-12-23 NOTE — Progress Notes (Signed)
12/24/2017 2:02 PM   Elaine Clayton [birth year] [id number]  Referring provider: Idelle Crouch, MD [facility] [place], [place] [address]  Chief Complaint  Patient presents with  . Urinary Incontinence    3 month    HPI: 72 yo WF with urinary urgency and frequency presents today for a three week follow up after starting Myrbetriq 25 mg daily.  Background history 72 year old female who returns today to discuss urinary urgency and frequency.  She has developed urgency, urge incontinence, and enuresis (about one per week) over the past year.  She does have a smoking history, quit 15 year ago.  1ppd x 30 years.  No gross hematuria.  No dysuria.   She was last seen in our office in 01/2015 at which time she was evaluated for recurrent urinary tract infections. She noted an increased frequency of infections after coming off of her estrogen patch (HRT).  She has since resumed this medication and not had a UTI since that time.   She has a personal history of breast cancer status post lumpectomy and 30.  She is drinking decaf green tea though the day along with several cups of coffee.  She does struggle with chronic constipation.   She has had issues with her gait and is being followed a neurologist.  Brain MRI negative.    Cystoscopy performed on 09/23/2017 noted mild urethral stricture.  Cytology was negative.  Today, she is experiencing urgency x 4-7, frequenc4-7, is restricting fluids to avoid visits to the restroom, is engaging in toilet mapping, incontinence x 0-3 and nocturia x 0-3.  Her PVR is 29 mL.  Her BP is 143/77.   She has not had fevers, chills, nausea or vomiting.  She has not had suprapubic pain, gross hematuria or dysuria.   Her major complaints are urgency, nocturia and intermittency.    She has stopped her Myrbetriq last week as she felt she was starting to have difficulty emptying her bladder.    PMH: Past Medical History:  Diagnosis Date  .  Anemia   . Bipolar affective disorder ([place])    2  . Broken foot 2015   right foot  . Chronic gastritis   . Chronic kidney disease    Dr Holley Raring  . COPD (chronic obstructive pulmonary disease) ([place])   . Dizziness    inner ear (per pt) several times per week  . Dysphagia   . Glaucoma   . Hyperlipemia   . Hypertension    pt has recently come off BP meds.  MD aware. BP seems stable.  . Lithium toxicity 2015  . 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 Neck stiffness    s/p C3-C4 fusion, limited right turn  . Osteoarthritis    back  . Pancreatitis   . Personal history of malignant neoplasm of breast   . Pituitary microadenoma ([place])   . Recurrent UTI   . Renal insufficiency   . Stomach ulcer 2112    Surgical History: Past Surgical History:  Procedure Laterality Date  . APPENDECTOMY    . BREAST BIOPSY Right 2011   neg  . BREAST EXCISIONAL BIOPSY Right 2001   neg  . BREAST EXCISIONAL BIOPSY Right 2008   breast ca 2008 radiation  . BREAST EXCISIONAL BIOPSY Left 10/26/2012   neg  . BREAST SURGERY Left  2013   left breast wide excision,Intraductal papilloma,  ductal hyperplasia and sclerosing adenosis. Microcalcifications associated with columnar cell change. No evidence of atypia or malignancy. Margins are unremarkable.  Marland Kitchen BREAST SURGERY Right November 06, 1999   multiple areas of microcalcification showing evidence of sclerosing adenosis and ductal hyperplasia.  Marland Kitchen BREAST SURGERY Left May 07, 2007   wide excision.  . cataract Right 2013  . CATARACT EXTRACTION W/PHACO Left 03/05/2016   Procedure: CATARACT EXTRACTION PHACO AND INTRAOCULAR LENS PLACEMENT (IOC);  Surgeon: Leandrew Koyanagi, MD;  Location: [place];  Service: Ophthalmology;  Laterality: Left;  . COLONOSCOPY  2004  . COLONOSCOPY N/A 05/14/2015   Procedure: COLONOSCOPY;  Surgeon: Manya Silvas, MD;  Location: [facility] ENDOSCOPY;   Service: Endoscopy;  Laterality: N/A;  . ERCP N/A   . ESOPHAGOGASTRODUODENOSCOPY N/A 05/14/2015   Procedure: ESOPHAGOGASTRODUODENOSCOPY (EGD);  Surgeon: Manya Silvas, MD;  Location: [facility] ENDOSCOPY;  Service: Endoscopy;  Laterality: N/A;  . EUS N/A 2011, 2012  . EYE SURGERY  2013  . GLAUCOMA SURGERY Right 2013  . LAPAROSCOPIC RIGHT COLECTOMY Right 06/22/2015   Procedure: LAPAROSCOPIC RIGHT COLECTOMY;  Surgeon: Robert Bellow, MD;  Location: [facility] ORS;  Service: General;  Laterality: Right;  . NECK SURGERY  2006  . right breast cancer    . SAVORY DILATION N/A 05/14/2015   Procedure: SAVORY DILATION;  Surgeon: Manya Silvas, MD;  Location: [facility] ENDOSCOPY;  Service: Endoscopy;  Laterality: N/A;  . TRABECULECTOMY Left 03/05/2016   Procedure: TRABECULECTOMY WITH [facility] AND [facility];  Surgeon: Leandrew Koyanagi, MD;  Location: [place];  Service: Ophthalmology;  Laterality: Left;  . TUBAL LIGATION      Home Medications:  Allergies as of 12/24/2017      Reactions   Influenza Vaccines Anaphylaxis   Flu Virus Vaccine Other (See Comments)   Muscle spams   Sulfa Antibiotics Rash   Tetracyclines & Related Rash      Medication List        Accurate as of 12/24/17  2:02 PM. Always use your most recent med list.          1ST MEDX-PATCH/ LIDOCAINE 4-0.025-5-20 % Ptch Generic drug:  Lido-Capsaicin-Men-Methyl Sal Apply topically as needed.   ALPRAZolam 0.5 MG tablet Commonly known as:  XANAX Take 0.5 mg by mouth 2 (two) times daily as needed for sleep. 0.5-1 tablet   aspirin EC 81 MG tablet Take 1 tablet (81 mg total) by mouth daily.   AZOPT 1 % ophthalmic suspension Generic drug:  brinzolamide Place 1 drop into the left eye.   bisacodyl 5 MG EC tablet Commonly known as:  DULCOLAX Take 5 mg by mouth daily as needed for moderate constipation.   buPROPion 150 MG 12 hr tablet Commonly known as:  WELLBUTRIN SR Take 150 mg by mouth every morning. 2 tablets   COMBIGAN  OP Apply 1 drop to eye 2 (two) times daily.   dextromethorphan 15 MG/5ML syrup Take 10 mLs by mouth 4 (four) times daily as needed for cough.   escitalopram 10 MG tablet Commonly known as:  LEXAPRO Take 10 mg by mouth daily.   estradiol-levonorgestrel 0.045-0.015 MG/DAY Commonly known as:  CLIMARA PRO apply 1 patch every week   fluticasone furoate-vilanterol 100-25 MCG/INH Aepb Commonly known as:  BREO ELLIPTA Inhale 1 puff into the lungs daily.   gabapentin 100 MG capsule Commonly known as:  NEURONTIN Take 1-3 capsules (100-300 mg total) by mouth at bedtime.   HYDROcodone-acetaminophen 5-325 MG tablet Commonly known as:  NORCO/VICODIN Take 1  tablet every 6 (six) hours as needed by mouth for moderate pain.   ipratropium 0.06 % nasal spray Commonly known as:  ATROVENT Place 2 sprays into both nostrils 4 (four) times daily as needed for rhinitis.   losartan 25 MG tablet Commonly known as:  COZAAR Take 25 mg by mouth daily.   LUMIGAN 0.01 % Soln Generic drug:  bimatoprost apply 1 drop to into both  eyes at bedtime once daily ONLY   mirabegron ER 25 MG Tb24 tablet Commonly known as:  MYRBETRIQ Take 1 tablet (25 mg total) by mouth daily.   mirabegron ER 25 MG Tb24 tablet Commonly known as:  MYRBETRIQ Take 1 tablet (25 mg total) by mouth daily.   nicotine polacrilex 2 MG gum Commonly known as:  NICORETTE Take 2 mg by mouth as needed for smoking cessation.   ondansetron 8 MG tablet Commonly known as:  ZOFRAN Take by mouth every 8 (eight) hours as needed for nausea or vomiting.   ondansetron 4 MG tablet Commonly known as:  ZOFRAN   ranitidine 150 MG tablet Commonly known as:  ZANTAC Take 150 mg by mouth daily.   rosuvastatin 10 MG tablet Commonly known as:  CRESTOR Take 10 mg by mouth at bedtime.   VENTOLIN HFA 108 (90 Base) MCG/ACT inhaler Generic drug:  albuterol inhale 2 puffs by mouth INTO THE LUNGS every 6 hours if needed for wheezing or shortness of  breath       Allergies:  Allergies  Allergen Reactions  . Influenza Vaccines Anaphylaxis  . Flu Virus Vaccine Other (See Comments)    Muscle spams  . Sulfa Antibiotics Rash  . Tetracyclines & Related Rash    Family History: Family History  Problem Relation Age of Onset  . Breast cancer Mother 24  . Kidney cancer Neg Hx   . Bladder Cancer Neg Hx     Social History:  reports that she quit smoking about 10 years ago. She has a 35.00 pack-year smoking history. she has never used smokeless tobacco. She reports that she drinks alcohol. She reports that she does not use drugs.  ROS: UROLOGY Frequent Urination?: No Hard to postpone urination?: Yes Burning/pain with urination?: No Get up at night to urinate?: Yes Leakage of urine?: No Urine stream starts and stops?: No Trouble starting stream?: Yes Do you have to strain to urinate?: No Blood in urine?: No Urinary tract infection?: No Sexually transmitted disease?: No Injury to kidneys or bladder?: No Painful intercourse?: No Weak stream?: No Currently pregnant?: No Vaginal bleeding?: No Last menstrual period?: n  Gastrointestinal Nausea?: No Vomiting?: No Indigestion/heartburn?: Yes Diarrhea?: Yes Constipation?: Yes  Constitutional Fever: No Night sweats?: No Weight loss?: No Fatigue?: Yes  Skin Skin rash/lesions?: No Itching?: Yes  Eyes Blurred vision?: No Double vision?: No  Ears/Nose/Throat Sore throat?: No Sinus problems?: No  Hematologic/Lymphatic Swollen glands?: No Easy bruising?: Yes  Cardiovascular Leg swelling?: No Chest pain?: No  Respiratory Cough?: No Shortness of breath?: Yes  Endocrine Excessive thirst?: Yes  Musculoskeletal Back pain?: Yes Joint pain?: No  Neurological Headaches?: No Dizziness?: Yes  Psychologic Depression?: Yes Anxiety?: Yes  Physical Exam: BP (!) 143/77   Pulse 82   Ht 5\' 4"  (1.626 m)   Wt 105 lb (47.6 kg)   BMI 18.02 kg/m     Constitutional: Well nourished. Alert and oriented, No acute distress. HEENT: [place] AT, moist mucus membranes. Trachea midline, no masses. Cardiovascular: No clubbing, cyanosis, or edema. Respiratory: Normal respiratory effort, no  increased work of breathing. GI: Abdomen is soft, non tender, non distended, no abdominal masses. Liver and spleen not palpable.  No hernias appreciated.  Stool sample for occult testing is not indicated.   GU: No CVA tenderness.  No bladder fullness or masses.   Skin: No rashes, bruises or suspicious lesions. Lymph: No cervical or inguinal adenopathy. Neurologic: Grossly intact, no focal deficits, moving all 4 extremities. Psychiatric: Normal mood and affect.   Laboratory Data: Lab Results  Component Value Date   CREATININE 0.99 03/19/2017    Urinalysis Results for orders placed or performed in visit on 12/24/17  BLADDER SCAN AMB NON-IMAGING  Result Value Ref Range   Scan Result 16ml    I have reviewed the labs  Pertinent Imaging: Results for MACKENIZE, DELGADILLO (MRN [id number]) as of 12/24/2017 13:56  Ref. Range 12/24/2017 13:39  Scan Result Unknown 19ml    Assessment & Plan:   1. Urge incontinence  - BLADDER SCAN AMB NON-IMAGING  - she will take the Myrbetriq 25 mg qod  - RTC in three months for OAB questionnaire and PVR  2. Urinary frequency As above  3. Enuresis Improving  Return in about 3 months (around 03/24/2018) for PVR and OAB questionnaire.  Zara Council, PA-C  [place], [place], [place] [address] [phone]  I spent 25 min with this patient of which greater than 50% was spent in counseling and coordination of care with the patient.

## 2017-12-24 ENCOUNTER — Encounter: Payer: Self-pay | Admitting: Urology

## 2017-12-24 ENCOUNTER — Ambulatory Visit: Payer: Medicare Other | Admitting: Urology

## 2017-12-24 VITALS — BP 143/77 | HR 82 | Ht 64.0 in | Wt 105.0 lb

## 2017-12-24 DIAGNOSIS — N3941 Urge incontinence: Secondary | ICD-10-CM | POA: Diagnosis not present

## 2017-12-24 DIAGNOSIS — R350 Frequency of micturition: Secondary | ICD-10-CM | POA: Diagnosis not present

## 2017-12-24 DIAGNOSIS — R32 Unspecified urinary incontinence: Secondary | ICD-10-CM | POA: Diagnosis not present

## 2017-12-24 LAB — BLADDER SCAN AMB NON-IMAGING

## 2017-12-24 MED ORDER — MIRABEGRON ER 25 MG PO TB24: 25.0000 mg | ORAL_TABLET | Freq: Every day | ORAL | 12 refills | Status: DC

## 2017-12-28 ENCOUNTER — Encounter: Payer: Self-pay | Admitting: Physical Therapy

## 2017-12-28 ENCOUNTER — Ambulatory Visit: Payer: Medicare Other | Attending: Pain Medicine | Admitting: Physical Therapy

## 2017-12-28 DIAGNOSIS — R278 Other lack of coordination: Secondary | ICD-10-CM | POA: Diagnosis present

## 2017-12-28 DIAGNOSIS — M6281 Muscle weakness (generalized): Secondary | ICD-10-CM

## 2017-12-28 DIAGNOSIS — R2689 Other abnormalities of gait and mobility: Secondary | ICD-10-CM | POA: Diagnosis present

## 2017-12-28 NOTE — Therapy (Addendum)
[place] MAIN [facility] SERVICES [address] [place], [place], [address] Phone: [phone]   Fax:  [phone]  Physical Therapy Treatment/Progress Note  Patient Details  Name: Elaine Clayton MRN: [id number] Date of Birth: Feb 23, 1946 Referring Provider: Dr. Joanna Hews    Encounter Date: 12/28/2017  PT End of Session - 12/28/17 1312    Visit Number  20    Number of Visits  41    Date for PT Re-Evaluation  01/20/18    PT Start Time  1310    PT Stop Time  1348   PT Time Calculation (min)  38 min    Equipment Utilized During Treatment  Gait belt    Activity Tolerance  Patient tolerated treatment well;No increased pain    Behavior During Therapy  WFL for tasks assessed/performed       Past Medical History:  Diagnosis Date  . Anemia   . Bipolar affective disorder ([place])    2  . Broken foot 2015   right foot  . Chronic gastritis   . Chronic kidney disease    Dr Holley Raring  . COPD (chronic obstructive pulmonary disease) ([place])   . Dizziness    inner ear (per pt) several times per week  . Dysphagia   . Glaucoma   . Hyperlipemia   . Hypertension    pt has recently come off BP meds.  MD aware. BP seems stable.  . Lithium toxicity 2015  . 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 Neck stiffness    s/p C3-C4 fusion, limited right turn  . Osteoarthritis    back  . Pancreatitis   . Personal history of malignant neoplasm of breast   . Pituitary microadenoma ([place])   . Recurrent UTI   . Renal insufficiency   . Stomach ulcer 2112    Past Surgical History:  Procedure Laterality Date  . APPENDECTOMY    . BREAST BIOPSY Right 2011   neg  . BREAST EXCISIONAL BIOPSY Right 2001   neg  . BREAST EXCISIONAL BIOPSY Right 2008   breast ca 2008 radiation  . BREAST EXCISIONAL BIOPSY Left 10/26/2012   neg  . BREAST SURGERY Left  2013   left breast wide  excision,Intraductal papilloma, ductal hyperplasia and sclerosing adenosis. Microcalcifications associated with columnar cell change. No evidence of atypia or malignancy. Margins are unremarkable.  Marland Kitchen BREAST SURGERY Right November 06, 1999   multiple areas of microcalcification showing evidence of sclerosing adenosis and ductal hyperplasia.  Marland Kitchen BREAST SURGERY Left May 07, 2007   wide excision.  . cataract Right 2013  . CATARACT EXTRACTION W/PHACO Left 03/05/2016   Procedure: CATARACT EXTRACTION PHACO AND INTRAOCULAR LENS PLACEMENT (IOC);  Surgeon: Leandrew Koyanagi, MD;  Location: [place];  Service: Ophthalmology;  Laterality: Left;  . COLONOSCOPY  2004  . COLONOSCOPY N/A 05/14/2015   Procedure: COLONOSCOPY;  Surgeon: Manya Silvas, MD;  Location: [facility] ENDOSCOPY;  Service: Endoscopy;  Laterality: N/A;  . ERCP N/A   . ESOPHAGOGASTRODUODENOSCOPY N/A 05/14/2015   Procedure: ESOPHAGOGASTRODUODENOSCOPY (EGD);  Surgeon: Manya Silvas, MD;  Location: [facility] ENDOSCOPY;  Service: Endoscopy;  Laterality: N/A;  . EUS N/A 2011, 2012  . EYE SURGERY  2013  . GLAUCOMA SURGERY Right 2013  . LAPAROSCOPIC RIGHT COLECTOMY Right 06/22/2015   Procedure: LAPAROSCOPIC RIGHT COLECTOMY;  Surgeon: Robert Bellow, MD;  Location: [facility] ORS;  Service: General;  Laterality:  Right;  Marland Kitchen NECK SURGERY  2006  . right breast cancer    . SAVORY DILATION N/A 05/14/2015   Procedure: SAVORY DILATION;  Surgeon: Manya Silvas, MD;  Location: [facility] ENDOSCOPY;  Service: Endoscopy;  Laterality: N/A;  . TRABECULECTOMY Left 03/05/2016   Procedure: TRABECULECTOMY WITH [facility] AND [facility] SHUNT;  Surgeon: Leandrew Koyanagi, MD;  Location: [place];  Service: Ophthalmology;  Laterality: Left;  . TUBAL LIGATION      There were no vitals filed for this visit.  Subjective Assessment - 12/28/17 1314    Subjective  Patient reports continued low back/LLE pain; She reports that she is having a hard time sleeping; Patient  reports still being unsteady and bumping into things;     Pertinent History  72 yo Female with recent increase in back pain with subsequent numbness/tingling , weakness, and drop foot in B LEs, L>R starting about 3 months ago. She has had increased instability in gait and falls recently with minor injuries. Pt had recent epidural steroid injection L4-5, and L5-S1 with decrease in back pain since.Pt with extensive surgical and medical Hx including breast cancer (last surgery 2013), DJD with cervical fusion C3-5 in 2006, bipolar disorder, HTN, OA in bilateral shoulders L>R, hip and SIJ pain x >20 years.     Currently in Pain?  Yes    Pain Score  6     Pain Location  Back    Pain Orientation  Left;Lower    Pain Descriptors / Indicators  Aching;Sore    Pain Type  Chronic pain    Pain Radiating Towards  radiates into left hip;     Pain Onset  More than a month ago    Pain Frequency  Intermittent    Aggravating Factors   worse with prolonged car trips/standing/walking    Pain Relieving Factors  rest/heat/pain meds    Effect of Pain on Daily Activities  decreased activity tolerance;     Multiple Pain Sites  No         [facility] PT Assessment - 12/28/17 0001      Standardized Balance Assessment   Five times sit to stand comments   20 sec without HHA (with 1 episode of posterior loss of balance, >15 sec indicates increased risk for falls); slightly improved from 11/25/17 which was 23 sec without HHA    10 Meter Walk  1.05 m/s without AD, community ambulator, improved from 0.76 m/s on 11/25/17     TREATMENT: Leg press: BLE plate 75# 8B15 with cues to slow down eccentric return for better motor control/strengthening; BLE heel raises 75# 2x10 with min Vcs for positioning for better ankle strengthening;   Sitting with green tband around BLE: Hip abduction/ER x15 reps; Hip flexion march x15 bilaterally; Patient required min-moderate verbal/tactile cues for correct exercise technique including to  increase ROM for better strengthening;    Instructed patient in 10 meter walk, 5 times sit<>stand,see above;  Instructed patient in balance exercise: Standing on 1/2 bolster: Heel/toe rock x15 with rail assist for safety; Trying to balance with feet in neutral, BUE wand flexion x10 with min A for safety and cues to improve ankle strategies for better balance control;  Forward/backward step over 1/2 bolster unsupported x10 reps each direction with CGA for safety with cues to increase hip flexion for better foot clearance;  Side step over 1/2 bolster with yellow t band around BLE ankles x10 reps each direction with 2-1 rail assist to facilitate better hip abductor strengthening  while trying to negotiate an obstacle; reduced rail assist for increased dynamic balance challenge;    Reinforced HEP;                 PT Education - 12/28/17 1316    Education provided  Yes    Education Details  LE strengthening, HEP reinforced, aquatic recommendation;     Person(s) Educated  Patient    Methods  Explanation;Demonstration;Verbal cues    Comprehension  Verbalized understanding;Returned demonstration;Verbal cues required;Need further instruction       PT Short Term Goals - 11/25/17 1141      PT SHORT TERM GOAL #1   Title  Patient will deny any falls over past 4 weeks to demonstrate improved safety awareness at home and work.     Baseline  still having multiple falls;     Time  4    Period  Weeks    Status  On-going    Target Date  12/23/17      PT SHORT TERM GOAL #2   Title  Pt will obtain and appropriately use LRAD (Rollator suggested) in order to decraese fall risk and increase community access.     Baseline  Pt has obtained Rollator for use outside    Time  2    Period  Weeks    Status  Achieved        PT Long Term Goals - 12/28/17 1347      PT LONG TERM GOAL #1   Title  Patient will be independent in home exercise program to improve strength/mobility for better  functional independence with ADLs.    Baseline  Pt does part of her HEP about 3-4 times per week.    Time  8    Period  Weeks    Status  Partially Met    Target Date  01/20/18      PT LONG TERM GOAL #2   Title  Patient will increase Berg Balance score by > 6 points to demonstrate decreased fall risk during functional activities.; Deferred as patient's balance is more impaired dynamically and therefore FGA goal more appropriate;     Baseline  will defer at this time;     Time  8    Period  Weeks    Status  Deferred    Target Date  01/20/18      PT LONG TERM GOAL #3   Title  Patient (> 67 years old) will complete five times sit to stand test in < 15 seconds indicating an increased LE strength and improved balance.    Baseline  18 sec without HHA    Time  8    Period  Weeks    Status  Partially Met    Target Date  01/20/18      PT LONG TERM GOAL #4   Title  Patient will ascend/descend 4 stairs without rail assist independently without loss of balance to improve ability to get in/out of home.     Baseline  able to negotiate steps one step at a time without rail assist;     Time  8    Period  Weeks    Status  Partially Met    Target Date  01/20/18      PT LONG TERM GOAL #5   Title  Patient will increase BLE gross strength to 4/5 as to improve functional strength for independent gait, increased standing tolerance and increased ADL ability.    Time  8  Period  Weeks    Status  Partially Met    Target Date  01/20/18      PT LONG TERM GOAL #6   Title  Patient will increase Functional Gait Assessment score to >20/30 as to reduce fall risk and improve dynamic gait safety with community ambulation.    Baseline  19/30    Time  8    Period  Weeks    Status  Partially Met    Target Date  01/20/18            Plan - 12/28/17 1346    Clinical Impression Statement  Patient instructed in advanced balance and LE strengthening exercise. She was able to step over obstacles with less  rail assist exhibiting better dynamic balance control; Patient also exhibits better gait speed as compared to a month ago. Patient continues to have back pain especially during land exercise. She would benefit from additional skilled PT intervention including aquatic therapy to improve strength and balance in a way that will not increase back pain;     Rehab Potential  Fair    Clinical Impairments Affecting Rehab Potential  Pt does not use AD and had decreased knowledge of her limitations    PT Frequency  2x / week    PT Duration  8 weeks    PT Treatment/Interventions  ADLs/Self Care Home Management;Aquatic Therapy;Biofeedback;Canalith Repostioning;Cryotherapy;Moist Heat;Cognitive remediation;Neuromuscular re-education;Balance training;Therapeutic exercise;Therapeutic activities;Functional mobility training;Stair training;Gait training;DME Instruction;Patient/family education;Orthotic Fit/Training;Manual techniques;Passive range of motion;Visual/perceptual remediation/compensation;Vestibular;Energy conservation;Dry needling    PT Home Exercise Plan  continue as given;     Consulted and Agree with Plan of Care  Patient       Patient will benefit from skilled therapeutic intervention in order to improve the following deficits and impairments:  Abnormal gait, Decreased activity tolerance, Decreased balance, Decreased cognition, Decreased coordination, Decreased safety awareness, Decreased mobility, Decreased knowledge of use of DME, Decreased knowledge of precautions, Decreased endurance, Decreased skin integrity, Decreased strength, Difficulty walking, Dizziness, Postural dysfunction, Improper body mechanics, Impaired vision/preception, Impaired UE functional use, Impaired sensation, Hypomobility, Decreased range of motion, Impaired flexibility, Pain  Visit Diagnosis: Other lack of coordination  Other abnormalities of gait and mobility  Muscle weakness (generalized)     Problem List Patient  Active Problem List   Diagnosis Date Noted  . Lumbar facet joint syndrome (Primary Area of Pain) (Bilateral) (L>R) 07/29/2017  . Chronic sacroiliac joint pain (Bilateral) (L>R) 07/29/2017  . Chronic hip pain (Bilateral) (L>R) 07/29/2017  . Chronic lower extremity pain (Secondary Area of Pain) (Bilateral) (L>R) 07/29/2017  . Impairment of balance 07/07/2017  . Menopausal and female climacteric states 06/15/2017  . Lumbar foraminal stenosis (T12-S1, multilevel) 06/10/2017  . Lumbar lateral recess stenosis (left L2-3, bilateral L3-4, & right L4-5) 06/10/2017  . Grade 1 Anterolisthesis of L4 over L5 and L5 over S1 06/10/2017  . Lumbar facet hypertrophy (multilevel) 06/10/2017  . Lumbar Ligamentum flavum hypertrophy (HCC) (multilevel) 06/10/2017  . Dropfoot (Left) (L5 radiculopathy) 06/10/2017  . Neurogenic pain 06/09/2017  . DDD (degenerative disc disease), lumbar 06/09/2017  . 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 Closed fracture of metatarsal bone 05/06/2017  . 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 Abnormal MRI, cervical spine 05/06/2017  . 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[place]) 05/06/2017  . Cervical radiculitis 05/06/2017  . Chronic lumbar radiculopathy (Bilateral) (L>R) (left L5) 05/06/2017  . Chronic low back pain (Primary Area of Pain) (  Bilateral) (L>R) 05/06/2017  . Abnormal MRI, lumbar spine 05/06/2017  . Chronic pain syndrome 03/19/2017  . Chronic shoulder pain (Fourth Area of Pain) (Bilateral) (L>R) 03/19/2017  . Osteoarthritis of shoulder (Bilateral) 03/19/2017  . Chronic neck pain [facility] Area of Pain) (Bilateral) (L>R) 03/19/2017  . History of C3-5 ACDF 03/19/2017  . Numbness and tingling in left upper extremity 03/19/2017  . Numbness and tingling of right upper extremity 03/19/2017  . Upper extremity weakness (Bilateral) (L>R) 03/19/2017  . Numbness of upper  extremity (Bilateral) (L>R) 03/19/2017  . Numbness and tingling of lower extremity (Bilateral) (L>R) 03/19/2017  . Weakness of lower extremity (Bilateral) (R>L) 03/19/2017  . Chronic abdominal pain (epigastric) 03/19/2017  . Pituitary microadenoma ([place]) 12/08/2016  . History of breast cancer in adulthood 12/08/2016  . Dysmetria 12/08/2016  . Loss of weight 08/15/2015  . Colon polyp 05/25/2015  . Personal history of malignant neoplasm of breast 07/18/2014  . Breast cancer ([place]) 06/28/2014  . Bipolar disorder ([place]) 06/28/2014  . Chronic pancreatitis ([place]) 06/28/2014  . HTN (hypertension) 06/28/2014  . OA (osteoarthritis) 06/28/2014  . Renal insufficiency 06/28/2014  . DDD (degenerative disc disease), cervical 06/28/2014  . Hyperlipidemia 06/28/2014  . COPD (chronic obstructive pulmonary disease) ([place]) 05/17/2014  . Breast cancer of upper-outer quadrant of right female breast ([place]) 05/31/2008    Merlyn Conley PT, DPT 12/28/2017, 1:48 PM  [place] MAIN [facility] SERVICES [place], [place], [address] Phone: [phone]   Fax:  [phone]  Name: Elaine Clayton MRN: [id number] Date of Birth: 1946/06/25

## 2017-12-29 ENCOUNTER — Ambulatory Visit: Payer: Medicare Other | Admitting: Pain Medicine

## 2017-12-31 ENCOUNTER — Other Ambulatory Visit: Payer: Self-pay

## 2017-12-31 ENCOUNTER — Ambulatory Visit: Payer: Medicare Other

## 2017-12-31 DIAGNOSIS — M6281 Muscle weakness (generalized): Secondary | ICD-10-CM

## 2017-12-31 DIAGNOSIS — R2689 Other abnormalities of gait and mobility: Secondary | ICD-10-CM

## 2017-12-31 DIAGNOSIS — R278 Other lack of coordination: Secondary | ICD-10-CM | POA: Diagnosis not present

## 2017-12-31 NOTE — Therapy (Signed)
[place] MAIN [facility] [address] [place], [place], [address] Phone: [phone]   Fax:  [phone]  Physical Therapy Treatment  Patient Details  Name: Elaine Clayton MRN: [id number] Date of Birth: 01/06/1946 Referring Provider: Dr. Joanna Hews    Encounter Date: 12/31/2017  PT End of Session - 12/31/17 1459    Visit Number  21    Number of Visits  41    Date for PT Re-Evaluation  01/20/18    PT Start Time  1120    PT Stop Time  1220    PT Time Calculation (min)  60 min    Activity Tolerance  Patient tolerated treatment well;No increased pain    Behavior During Therapy  WFL for tasks assessed/performed       Past Medical History:  Diagnosis Date  . Anemia   . Bipolar affective disorder ([place])    2  . Broken foot 2015   right foot  . Chronic gastritis   . Chronic kidney disease    Dr Holley Raring  . COPD (chronic obstructive pulmonary disease) ([place])   . Dizziness    inner ear (per pt) several times per week  . Dysphagia   . Glaucoma   . Hyperlipemia   . Hypertension    pt has recently come off BP meds.  MD aware. BP seems stable.  . Lithium toxicity 2015  . 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 Neck stiffness    s/p C3-C4 fusion, limited right turn  . Osteoarthritis    back  . Pancreatitis   . Personal history of malignant neoplasm of breast   . Pituitary microadenoma ([place])   . Recurrent UTI   . Renal insufficiency   . Stomach ulcer 2112    Past Surgical History:  Procedure Laterality Date  . APPENDECTOMY    . BREAST BIOPSY Right 2011   neg  . BREAST EXCISIONAL BIOPSY Right 2001   neg  . BREAST EXCISIONAL BIOPSY Right 2008   breast ca 2008 radiation  . BREAST EXCISIONAL BIOPSY Left 10/26/2012   neg  . BREAST SURGERY Left  2013   left breast wide excision,Intraductal papilloma, ductal hyperplasia and sclerosing adenosis.  Microcalcifications associated with columnar cell change. No evidence of atypia or malignancy. Margins are unremarkable.  Marland Kitchen BREAST SURGERY Right November 06, 1999   multiple areas of microcalcification showing evidence of sclerosing adenosis and ductal hyperplasia.  Marland Kitchen BREAST SURGERY Left May 07, 2007   wide excision.  . cataract Right 2013  . CATARACT EXTRACTION W/PHACO Left 03/05/2016   Procedure: CATARACT EXTRACTION PHACO AND INTRAOCULAR LENS PLACEMENT (IOC);  Surgeon: Leandrew Koyanagi, MD;  Location: [place];  Service: Ophthalmology;  Laterality: Left;  . COLONOSCOPY  2004  . COLONOSCOPY N/A 05/14/2015   Procedure: COLONOSCOPY;  Surgeon: Manya Silvas, MD;  Location[facility] ENDOSCOPY;  Service: Endoscopy;  Laterality: N/A;  . ERCP N/A   . ESOPHAGOGASTRODUODENOSCOPY N/A 05/14/2015   Procedure: ESOPHAGOGASTRODUODENOSCOPY (EGD);  Surgeon: Manya Silvas, MD;  Location: [facility] ENDOSCOPY;  Service: Endoscopy;  Laterality: N/A;  . EUS N/A 2011, 2012  . EYE SURGERY  2013  . GLAUCOMA SURGERY Right 2013  . LAPAROSCOPIC RIGHT COLECTOMY Right 06/22/2015   Procedure: LAPAROSCOPIC RIGHT COLECTOMY;  Surgeon: Robert Bellow, MD;  Location: [facility] ORS;  Service: General;  Laterality: Right;  . NECK SURGERY  2006  . right  breast cancer    . SAVORY DILATION N/A 05/14/2015   Procedure: SAVORY DILATION;  Surgeon: Manya Silvas, MD;  Location: [facility] ENDOSCOPY;  Service: Endoscopy;  Laterality: N/A;  . TRABECULECTOMY Left 03/05/2016   Procedure: TRABECULECTOMY WITH [facility] AND [facility];  Surgeon: Leandrew Koyanagi, MD;  Location: [facility];  Service: Ophthalmology;  Laterality: Left;  . TUBAL LIGATION      There were no vitals filed for this visit.  Subjective Assessment - 12/31/17 1454    Subjective  Pt denies pain currently, as it is still early in the day. Pt notes pain usually begins in the afternoons and progresses into evening. LLE continues to be her greatest problem with  weakness and numbness. Pt also notes getting up out of a chair is difficult; elevating with a pillow has been helpful    Pertinent History  72 yo Female with recent increase in back pain with subsequent numbness/tingling , weakness, and drop foot in B LEs, L>R starting about 3 months ago. She has had increased instability in gait and falls recently with minor injuries. Pt had recent epidural steroid injection L4-5, and L5-S1 with decrease in back pain since.Pt with extensive surgical and medical Hx including breast cancer (last surgery 2013), DJD with cervical fusion C3-5 in 2006, bipolar disorder, HTN, OA in bilateral shoulders L>R, hip and SIJ pain x >20 years.       Enters/exits via ramp Participates in the following  Ambulation with kick boards for UE support -4 L fwd - 4 L side  Step, 25x each - step up/downs, R/L lead  Abdominal stabilization with UE work, shoulders midway in water, 20x ea - sh flex/ext with 1 dumbell - sh abd/add with dumbells - triceps press downs with dumbells - horiz abd/add, sh's in water  Bench abdominal stabilization with LE work, 1 min each - bike - scissor - flutter  Squats at wall with UE support, 25x  STS at bench without UE support, Min guard 10x                           PT Education - 12/31/17 1457    Education provided  Yes    Education Details  Re education on core strengthening. Education on safety particularly on steps (use rail for balance support). STS from bench without UE support for strengthening.    Person(s) Educated  Patient    Methods  Explanation;Demonstration;Tactile cues;Verbal cues       PT Short Term Goals - 12/31/17 1502      PT SHORT TERM GOAL #1   Title  Patient will deny any falls over past 4 weeks to demonstrate improved safety awareness at home and work.     Baseline  still having multiple falls;     Time  4    Period  Weeks    Status  On-going      PT SHORT TERM GOAL #2   Title  Pt  will obtain and appropriately use LRAD (Rollator suggested) in order to decraese fall risk and increase community access.     Baseline  Pt has obtained Rollator for use outside    Time  2    Period  Weeks    Status  Achieved        PT Long Term Goals - 12/31/17 1503      PT LONG TERM GOAL #1   Title  Patient will be independent in home exercise  program to improve strength/mobility for better functional independence with ADLs.    Baseline  Pt does part of her HEP about 3-4 times per week.    Time  8    Period  Weeks    Status  Partially Met      PT LONG TERM GOAL #2   Title  Patient will increase Berg Balance score by > 6 points to demonstrate decreased fall risk during functional activities.; Deferred as patient's balance is more impaired dynamically and therefore FGA goal more appropriate;     Baseline  will defer at this time;     Time  8    Period  Weeks    Status  Deferred      PT LONG TERM GOAL #3   Title  Patient (> 47 years old) will complete five times sit to stand test in < 15 seconds indicating an increased LE strength and improved balance.    Baseline  18 sec without HHA    Time  8    Period  Weeks    Status  Partially Met      PT LONG TERM GOAL #4   Title  Patient will ascend/descend 4 stairs without rail assist independently without loss of balance to improve ability to get in/out of home.     Baseline  able to negotiate steps one step at a time without rail assist;     Time  8    Period  Weeks    Status  Partially Met      PT LONG TERM GOAL #5   Title  Patient will increase BLE gross strength to 4/5 as to improve functional strength for independent gait, increased standing tolerance and increased ADL ability.    Time  8    Period  Weeks    Status  Partially Met      PT LONG TERM GOAL #6   Title  Patient will increase Functional Gait Assessment score to >20/30 as to reduce fall risk and improve dynamic gait safety with community ambulation.    Baseline   19/30    Time  8    Period  Weeks    Status  Partially Met            Plan - 12/31/17 1459    Clinical Impression Statement  Pt participated in core and LE strengthening exercises with explanation on carryover to home activities. Emphasized using safety precautions at home such as railing hold on step and pressing up from and reaching back to a chair explaining that this is not sabatoging her strengthening efforts, but safely performing a function.     Rehab Potential  Fair    Clinical Impairments Affecting Rehab Potential  Pt does not use AD and had decreased knowledge of her limitations    PT Frequency  2x / week    PT Duration  8 weeks    PT Treatment/Interventions  ADLs/Self Care Home Management;Aquatic Therapy;Biofeedback;Canalith Repostioning;Cryotherapy;Moist Heat;Cognitive remediation;Neuromuscular re-education;Balance training;Therapeutic exercise;Therapeutic activities;Functional mobility training;Stair training;Gait training;DME Instruction;Patient/family education;Orthotic Fit/Training;Manual techniques;Passive range of motion;Visual/perceptual remediation/compensation;Vestibular;Energy conservation;Dry needling    PT Home Exercise Plan  continue as given;     Consulted and Agree with Plan of Care  Patient       Patient will benefit from skilled therapeutic intervention in order to improve the following deficits and impairments:  Abnormal gait, Decreased activity tolerance, Decreased balance, Decreased cognition, Decreased coordination, Decreased safety awareness, Decreased mobility, Decreased knowledge of use of DME, Decreased  knowledge of precautions, Decreased endurance, Decreased skin integrity, Decreased strength, Difficulty walking, Dizziness, Postural dysfunction, Improper body mechanics, Impaired vision/preception, Impaired UE functional use, Impaired sensation, Hypomobility, Decreased range of motion, Impaired flexibility, Pain  Visit Diagnosis: Other lack of  coordination  Other abnormalities of gait and mobility  Muscle weakness (generalized)     Problem List Patient Active Problem List   Diagnosis Date Noted  . Lumbar facet joint syndrome (Primary Area of Pain) (Bilateral) (L>R) 07/29/2017  . Chronic sacroiliac joint pain (Bilateral) (L>R) 07/29/2017  . Chronic hip pain (Bilateral) (L>R) 07/29/2017  . Chronic lower extremity pain (Secondary Area of Pain) (Bilateral) (L>R) 07/29/2017  . Impairment of balance 07/07/2017  . Menopausal and female climacteric states 06/15/2017  . Lumbar foraminal stenosis (T12-S1, multilevel) 06/10/2017  . Lumbar lateral recess stenosis (left L2-3, bilateral L3-4, & right L4-5) 06/10/2017  . Grade 1 Anterolisthesis of L4 over L5 and L5 over S1 06/10/2017  . Lumbar facet hypertrophy (multilevel) 06/10/2017  . Lumbar Ligamentum flavum hypertrophy (HCC) (multilevel) 06/10/2017  . Dropfoot (Left) (L5 radiculopathy) 06/10/2017  . Neurogenic pain 06/09/2017  . DDD (degenerative disc disease), lumbar 06/09/2017  . 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 Closed fracture of metatarsal bone 05/06/2017  . 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 Abnormal MRI, cervical spine 05/06/2017  . 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[place]) 05/06/2017  . Cervical radiculitis 05/06/2017  . Chronic lumbar radiculopathy (Bilateral) (L>R) (left L5) 05/06/2017  . Chronic low back pain (Primary Area of Pain) (Bilateral) (L>R) 05/06/2017  . Abnormal MRI, lumbar spine 05/06/2017  . Chronic pain syndrome 03/19/2017  . Chronic shoulder pain (Fourth Area of Pain) (Bilateral) (L>R) 03/19/2017  . Osteoarthritis of shoulder (Bilateral) 03/19/2017  . Chronic neck pain [facility] Area of Pain) (Bilateral) (L>R) 03/19/2017  . History of C3-5 ACDF 03/19/2017  . Numbness and tingling in left upper extremity 03/19/2017  . Numbness and  tingling of right upper extremity 03/19/2017  . Upper extremity weakness (Bilateral) (L>R) 03/19/2017  . Numbness of upper extremity (Bilateral) (L>R) 03/19/2017  . Numbness and tingling of lower extremity (Bilateral) (L>R) 03/19/2017  . Weakness of lower extremity (Bilateral) (R>L) 03/19/2017  . Chronic abdominal pain (epigastric) 03/19/2017  . Pituitary microadenoma ([place]) 12/08/2016  . History of breast cancer in adulthood 12/08/2016  . Dysmetria 12/08/2016  . Loss of weight 08/15/2015  . Colon polyp 05/25/2015  . Personal history of malignant neoplasm of breast 07/18/2014  . Breast cancer ([place]) 06/28/2014  . Bipolar disorder ([place]) 06/28/2014  . Chronic pancreatitis ([place]) 06/28/2014  . HTN (hypertension) 06/28/2014  . OA (osteoarthritis) 06/28/2014  . Renal insufficiency 06/28/2014  . DDD (degenerative disc disease), cervical 06/28/2014  . Hyperlipidemia 06/28/2014  . COPD (chronic obstructive pulmonary disease) ([place]) 05/17/2014  . Breast cancer of upper-outer quadrant of right female breast ([place]) 05/31/2008    Larae Grooms 12/31/2017, 3:04 PM  [place] MAIN [facility] [address] [place], [place], [address] Phone: [phone]   Fax:  [phone]  Name: Elaine Clayton MRN: [id number] Date of Birth: October 01, 1946

## 2018-01-04 ENCOUNTER — Telehealth: Payer: Self-pay

## 2018-01-04 NOTE — Telephone Encounter (Signed)
Pt calling for Elaine Clayton to check on the refill for her patch . She thinks Judson Roch at [employer] the papers to go ahead & fax that it's ok. The same thing that has to be done every year. Pt requests return call. [phone]

## 2018-01-04 NOTE — Telephone Encounter (Signed)
Spoke w/pt. Notified Janett Billow out of the office today. She will return tomorrow. She may know if anything has been received from the pharmacy. Also, [facility] not in the office til Wed.

## 2018-01-05 ENCOUNTER — Other Ambulatory Visit: Payer: Self-pay | Admitting: Obstetrics & Gynecology

## 2018-01-05 ENCOUNTER — Ambulatory Visit: Payer: Medicare Other | Admitting: Pain Medicine

## 2018-01-05 DIAGNOSIS — N951 Menopausal and female climacteric states: Secondary | ICD-10-CM

## 2018-01-05 MED ORDER — ESTRADIOL-LEVONORGESTREL 0.045-0.015 MG/DAY TD PTWK: MEDICATED_PATCH | TRANSDERMAL | 0 refills | Status: DC

## 2018-01-05 NOTE — Telephone Encounter (Signed)
ERx done

## 2018-01-05 NOTE — Telephone Encounter (Signed)
Do you want to refill until pt appointment?

## 2018-01-07 ENCOUNTER — Ambulatory Visit: Payer: Medicare Other

## 2018-01-14 ENCOUNTER — Other Ambulatory Visit: Payer: Self-pay

## 2018-01-14 ENCOUNTER — Ambulatory Visit: Payer: Medicare Other

## 2018-01-14 DIAGNOSIS — M6281 Muscle weakness (generalized): Secondary | ICD-10-CM

## 2018-01-14 DIAGNOSIS — R278 Other lack of coordination: Secondary | ICD-10-CM

## 2018-01-14 DIAGNOSIS — R2689 Other abnormalities of gait and mobility: Secondary | ICD-10-CM

## 2018-01-14 NOTE — Therapy (Signed)
[place] MAIN [facility] SERVICES [address] [place], [place], [address] Phone: [phone]   Fax:  [phone]  Physical Therapy Treatment  Patient Details  Name: Elaine Clayton MRN: [id number] Date of Birth: Jun 24, 1946 Referring Provider: Dr. Joanna Hews    Encounter Date: 01/14/2018  PT End of Session - 01/14/18 1411    Visit Number  21    Number of Visits  41    Date for PT Re-Evaluation  01/20/18    PT Start Time  1120    PT Stop Time  1225    PT Time Calculation (min)  65 min       Past Medical History:  Diagnosis Date  . Anemia   . Bipolar affective disorder ([place])    2  . Broken foot 2015   right foot  . Chronic gastritis   . Chronic kidney disease    Dr Holley Raring  . COPD (chronic obstructive pulmonary disease) ([place])   . Dizziness    inner ear (per pt) several times per week  . Dysphagia   . Glaucoma   . Hyperlipemia   . Hypertension    pt has recently come off BP meds.  MD aware. BP seems stable.  . Lithium toxicity 2015  . 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 Neck stiffness    s/p C3-C4 fusion, limited right turn  . Osteoarthritis    back  . Pancreatitis   . Personal history of malignant neoplasm of breast   . Pituitary microadenoma ([place])   . Recurrent UTI   . Renal insufficiency   . Stomach ulcer 2112    Past Surgical History:  Procedure Laterality Date  . APPENDECTOMY    . BREAST BIOPSY Right 2011   neg  . BREAST EXCISIONAL BIOPSY Right 2001   neg  . BREAST EXCISIONAL BIOPSY Right 2008   breast ca 2008 radiation  . BREAST EXCISIONAL BIOPSY Left 10/26/2012   neg  . BREAST SURGERY Left  2013   left breast wide excision,Intraductal papilloma, ductal hyperplasia and sclerosing adenosis. Microcalcifications associated with columnar cell change. No evidence of atypia or malignancy. Margins are unremarkable.  [facility] Kitchen BREAST SURGERY  Right November 06, 1999   multiple areas of microcalcification showing evidence of sclerosing adenosis and ductal hyperplasia.  Marland Kitchen BREAST SURGERY Left May 07, 2007   wide excision.  . cataract Right 2013  . CATARACT EXTRACTION W/PHACO Left 03/05/2016   Procedure: CATARACT EXTRACTION PHACO AND INTRAOCULAR LENS PLACEMENT (IOC);  Surgeon: Leandrew Koyanagi, MD;  Location: [place];  Service: Ophthalmology;  Laterality: Left;  . COLONOSCOPY  2004  . COLONOSCOPY N/A 05/14/2015   Procedure: COLONOSCOPY;  Surgeon: Manya Silvas, MD;  Location: [facility] ENDOSCOPY;  Service: Endoscopy;  Laterality: N/A;  . ERCP N/A   . ESOPHAGOGASTRODUODENOSCOPY N/A 05/14/2015   Procedure: ESOPHAGOGASTRODUODENOSCOPY (EGD);  Surgeon: Manya Silvas, MD;  Location: [facility] ENDOSCOPY;  Service: Endoscopy;  Laterality: N/A;  . EUS N/A 2011, 2012  . EYE SURGERY  2013  . GLAUCOMA SURGERY Right 2013  . LAPAROSCOPIC RIGHT COLECTOMY Right 06/22/2015   Procedure: LAPAROSCOPIC RIGHT COLECTOMY;  Surgeon: Robert Bellow, MD;  Location: [facility] ORS;  Service: General;  Laterality: Right;  . NECK SURGERY  2006  . right breast cancer    . SAVORY DILATION N/A 05/14/2015   Procedure: SAVORY DILATION;  Surgeon: Manya Silvas, MD;  Location:  [place] ENDOSCOPY;  Service: Endoscopy;  Laterality: N/A;  . TRABECULECTOMY Left 03/05/2016   Procedure: TRABECULECTOMY WITH [facility] AND [facility];  Surgeon: Leandrew Koyanagi, MD;  Location: [place];  Service: Ophthalmology;  Laterality: Left;  . TUBAL LIGATION      There were no vitals filed for this visit.  Subjective Assessment - 01/14/18 1405    Subjective  Pt reports no pain currently (took Vicodin this morn) Pt reports no ill affects post spinal tap. States final result from testing concludes mild neurologic involvement; pt and MD agree avoiding medication at this time and focusing on PT. Pt does feel LLE is gaining strength and notes her husband notices a difference as  well in her function. LLE does continue to have intermittent drag with ambulation.     Pertinent History  72 yo Female with recent increase in back pain with subsequent numbness/tingling , weakness, and drop foot in B LEs, L>R starting about 3 months ago. She has had increased instability in gait and falls recently with minor injuries. Pt had recent epidural steroid injection L4-5, and L5-S1 with decrease in back pain since.Pt with extensive surgical and medical Hx including breast cancer (last surgery 2013), DJD with cervical fusion C3-5 in 2006, bipolar disorder, HTN, OA in bilateral shoulders L>R, hip and SIJ pain x >20 years.       Pt enters/exits via ramp Participates in the following  Ambulation with kick boards - 4 L fwd - 4 L side step with squat - 2 L bkwd   Step activities - Step up/downs, R/L lead 30x ea - Single leg squat with opposite heel tap, R/L 25x ea - Side lunge onto step with return to upright, light UE support 20x ea  Bench activities, 2# wts - Bicycle 2 x 2.5 min, core stabilization  - Straight leg up/out, B 10x ea - March with core stabilization - STS without UE support, hands together at chest, 30x                          PT Education - 01/14/18 1409    Education provided  Yes    Education Details  Progressing challenge for strength activiities combined with balance activities: side lunge on step, unsupported STS. Progressing LE strengthening through use of weights in the water.    Person(s) Educated  Patient       PT Short Term Goals - 01/14/18 1415      PT SHORT TERM GOAL #1   Title  Patient will deny any falls over past 4 weeks to demonstrate improved safety awareness at home and work.     Baseline  still having multiple falls;     Time  4    Period  Weeks    Status  On-going      PT SHORT TERM GOAL #2   Title  Pt will obtain and appropriately use LRAD (Rollator suggested) in order to decraese fall risk and increase community  access.     Baseline  Pt has obtained Rollator for use outside    Time  2    Period  Weeks    Status  Achieved        PT Long Term Goals - 01/14/18 1415      PT LONG TERM GOAL #1   Title  Patient will be independent in home exercise program to improve strength/mobility for better functional independence with ADLs.    Baseline  Pt  does part of her HEP about 3-4 times per week.    Time  8    Period  Weeks    Status  Partially Met      PT LONG TERM GOAL #2   Title  Patient will increase Berg Balance score by > 6 points to demonstrate decreased fall risk during functional activities.; Deferred as patient's balance is more impaired dynamically and therefore FGA goal more appropriate;     Baseline  will defer at this time;     Time  8    Period  Weeks    Status  Deferred      PT LONG TERM GOAL #3   Title  Patient (> 81 years old) will complete five times sit to stand test in < 15 seconds indicating an increased LE strength and improved balance.    Baseline  18 sec without HHA    Time  8    Period  Weeks    Status  Partially Met      PT LONG TERM GOAL #4   Title  Patient will ascend/descend 4 stairs without rail assist independently without loss of balance to improve ability to get in/out of home.     Baseline  able to negotiate steps one step at a time without rail assist;     Time  8    Period  Weeks    Status  Partially Met      PT LONG TERM GOAL #5   Title  Patient will increase BLE gross strength to 4/5 as to improve functional strength for independent gait, increased standing tolerance and increased ADL ability.    Time  8    Period  Weeks    Status  Partially Met      PT LONG TERM GOAL #6   Title  Patient will increase Functional Gait Assessment score to >20/30 as to reduce fall risk and improve dynamic gait safety with community ambulation.    Baseline  19/30    Time  8    Period  Weeks    Status  Partially Met            Plan - 01/14/18 1412    Clinical  Impression Statement  Pt progressing balance/strength and strength activities with Min A as needed. Verbal cues required throughout for technique. Pt will continue to benefit from progression of aquatic therapy.     Rehab Potential  Fair    Clinical Impairments Affecting Rehab Potential  Pt does not use AD and had decreased knowledge of her limitations    PT Frequency  2x / week    PT Duration  8 weeks    PT Treatment/Interventions  ADLs/Self Care Home Management;Aquatic Therapy;Biofeedback;Canalith Repostioning;Cryotherapy;Moist Heat;Cognitive remediation;Neuromuscular re-education;Balance training;Therapeutic exercise;Therapeutic activities;Functional mobility training;Stair training;Gait training;DME Instruction;Patient/family education;Orthotic Fit/Training;Manual techniques;Passive range of motion;Visual/perceptual remediation/compensation;Vestibular;Energy conservation;Dry needling    PT Home Exercise Plan  continue as given;     Consulted and Agree with Plan of Care  Patient       Patient will benefit from skilled therapeutic intervention in order to improve the following deficits and impairments:  Abnormal gait, Decreased activity tolerance, Decreased balance, Decreased cognition, Decreased coordination, Decreased safety awareness, Decreased mobility, Decreased knowledge of use of DME, Decreased knowledge of precautions, Decreased endurance, Decreased skin integrity, Decreased strength, Difficulty walking, Dizziness, Postural dysfunction, Improper body mechanics, Impaired vision/preception, Impaired UE functional use, Impaired sensation, Hypomobility, Decreased range of motion, Impaired flexibility, Pain  Visit Diagnosis: Other lack of  coordination  Other abnormalities of gait and mobility  Muscle weakness (generalized)     Problem List Patient Active Problem List   Diagnosis Date Noted  . Lumbar facet joint syndrome (Primary Area of Pain) (Bilateral) (L>R) 07/29/2017  . Chronic  sacroiliac joint pain (Bilateral) (L>R) 07/29/2017  . Chronic hip pain (Bilateral) (L>R) 07/29/2017  . Chronic lower extremity pain (Secondary Area of Pain) (Bilateral) (L>R) 07/29/2017  . Impairment of balance 07/07/2017  . Menopausal and female climacteric states 06/15/2017  . Lumbar foraminal stenosis (T12-S1, multilevel) 06/10/2017  . Lumbar lateral recess stenosis (left L2-3, bilateral L3-4, & right L4-5) 06/10/2017  . Grade 1 Anterolisthesis of L4 over L5 and L5 over S1 06/10/2017  . Lumbar facet hypertrophy (multilevel) 06/10/2017  . Lumbar Ligamentum flavum hypertrophy (HCC) (multilevel) 06/10/2017  . Dropfoot (Left) (L5 radiculopathy) 06/10/2017  . Neurogenic pain 06/09/2017  . DDD (degenerative disc disease), lumbar 06/09/2017  . 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 Closed fracture of metatarsal bone 05/06/2017  . 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 Abnormal MRI, cervical spine 05/06/2017  . 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[place]) 05/06/2017  . Cervical radiculitis 05/06/2017  . Chronic lumbar radiculopathy (Bilateral) (L>R) (left L5) 05/06/2017  . Chronic low back pain (Primary Area of Pain) (Bilateral) (L>R) 05/06/2017  . Abnormal MRI, lumbar spine 05/06/2017  . Chronic pain syndrome 03/19/2017  . Chronic shoulder pain (Fourth Area of Pain) (Bilateral) (L>R) 03/19/2017  . Osteoarthritis of shoulder (Bilateral) 03/19/2017  . Chronic neck pain [facility] Area of Pain) (Bilateral) (L>R) 03/19/2017  . History of C3-5 ACDF 03/19/2017  . Numbness and tingling in left upper extremity 03/19/2017  . Numbness and tingling of right upper extremity 03/19/2017  . Upper extremity weakness (Bilateral) (L>R) 03/19/2017  . Numbness of upper extremity (Bilateral) (L>R) 03/19/2017  . Numbness and tingling of lower extremity (Bilateral) (L>R) 03/19/2017  . Weakness of lower  extremity (Bilateral) (R>L) 03/19/2017  . Chronic abdominal pain (epigastric) 03/19/2017  . Pituitary microadenoma ([place]) 12/08/2016  . History of breast cancer in adulthood 12/08/2016  . Dysmetria 12/08/2016  . Loss of weight 08/15/2015  . Colon polyp 05/25/2015  . Personal history of malignant neoplasm of breast 07/18/2014  . Breast cancer ([place]) 06/28/2014  . Bipolar disorder ([place]) 06/28/2014  . Chronic pancreatitis ([place]) 06/28/2014  . HTN (hypertension) 06/28/2014  . OA (osteoarthritis) 06/28/2014  . Renal insufficiency 06/28/2014  . DDD (degenerative disc disease), cervical 06/28/2014  . Hyperlipidemia 06/28/2014  . COPD (chronic obstructive pulmonary disease) ([place]) 05/17/2014  . Breast cancer of upper-outer quadrant of right female breast ([place]) 05/31/2008    Larae Grooms 01/14/2018, 2:16 PM  [place] MAIN [facility] SERVICES [address] [place], [place], [address] Phone: [phone]   Fax:  [phone]  Name: Elaine Clayton MRN: [id number] Date of Birth: 1946-08-10

## 2018-01-20 ENCOUNTER — Encounter: Payer: Self-pay | Admitting: Physical Therapy

## 2018-01-20 ENCOUNTER — Ambulatory Visit: Payer: Medicare Other | Admitting: Physical Therapy

## 2018-01-20 DIAGNOSIS — R278 Other lack of coordination: Secondary | ICD-10-CM

## 2018-01-20 DIAGNOSIS — M6281 Muscle weakness (generalized): Secondary | ICD-10-CM

## 2018-01-20 DIAGNOSIS — R2689 Other abnormalities of gait and mobility: Secondary | ICD-10-CM

## 2018-01-20 NOTE — Therapy (Signed)
[place] MAIN [facility] SERVICES [address] [place], [place], [address] Phone: [phone]   Fax:  [phone]  Physical Therapy Treatment/Progress Note  Patient Details  Name: BRYNLEY CUDDEBACK MRN: [id number] Date of Birth: 27-Aug-1946 Referring Provider: Dr. Joanna Hews    Encounter Date: 01/20/2018  PT End of Session - 01/20/18 1116    Visit Number  23    Number of Visits  49    Date for PT Re-Evaluation  02/17/18    PT Start Time  1110    PT Stop Time  1155    PT Time Calculation (min)  45 min    Equipment Utilized During Treatment  Gait belt    Activity Tolerance  Patient tolerated treatment well;No increased pain    Behavior During Therapy  WFL for tasks assessed/performed       Past Medical History:  Diagnosis Date  . Anemia   . Bipolar affective disorder ([place])    2  . Broken foot 2015   right foot  . Chronic gastritis   . Chronic kidney disease    Dr Holley Raring  . COPD (chronic obstructive pulmonary disease) ([place])   . Dizziness    inner ear (per pt) several times per week  . Dysphagia   . Glaucoma   . Hyperlipemia   . Hypertension    pt has recently come off BP meds.  MD aware. BP seems stable.  . Lithium toxicity 2015  . 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 Neck stiffness    s/p C3-C4 fusion, limited right turn  . Osteoarthritis    back  . Pancreatitis   . Personal history of malignant neoplasm of breast   . Pituitary microadenoma ([place])   . Recurrent UTI   . Renal insufficiency   . Stomach ulcer 2112    Past Surgical History:  Procedure Laterality Date  . APPENDECTOMY    . BREAST BIOPSY Right 2011   neg  . BREAST EXCISIONAL BIOPSY Right 2001   neg  . BREAST EXCISIONAL BIOPSY Right 2008   breast ca 2008 radiation  . BREAST EXCISIONAL BIOPSY Left 10/26/2012   neg  . BREAST SURGERY Left  2013   left breast wide  excision,Intraductal papilloma, ductal hyperplasia and sclerosing adenosis. Microcalcifications associated with columnar cell change. No evidence of atypia or malignancy. Margins are unremarkable.  Marland Kitchen BREAST SURGERY Right November 06, 1999   multiple areas of microcalcification showing evidence of sclerosing adenosis and ductal hyperplasia.  Marland Kitchen BREAST SURGERY Left May 07, 2007   wide excision.  . cataract Right 2013  . CATARACT EXTRACTION W/PHACO Left 03/05/2016   Procedure: CATARACT EXTRACTION PHACO AND INTRAOCULAR LENS PLACEMENT (IOC);  Surgeon: Leandrew Koyanagi, MD;  Location: [facility];  Service: Ophthalmology;  Laterality: Left;  . COLONOSCOPY  2004  . COLONOSCOPY N/A 05/14/2015   Procedure: COLONOSCOPY;  Surgeon: Manya Silvas, MD;  Location: [facility] ENDOSCOPY;  Service: Endoscopy;  Laterality: N/A;  . ERCP N/A   . ESOPHAGOGASTRODUODENOSCOPY N/A 05/14/2015   Procedure: ESOPHAGOGASTRODUODENOSCOPY (EGD);  Surgeon: Manya Silvas, MD;  Location: [facility] ENDOSCOPY;  Service: Endoscopy;  Laterality: N/A;  . EUS N/A 2011, 2012  . EYE SURGERY  2013  . GLAUCOMA SURGERY Right 2013  . LAPAROSCOPIC RIGHT COLECTOMY Right 06/22/2015   Procedure: LAPAROSCOPIC RIGHT COLECTOMY;  Surgeon: Robert Bellow, MD;  Location: [facility] ORS;  Service: General;  Laterality: Right;  . NECK SURGERY  2006  . right breast cancer    . SAVORY DILATION N/A 05/14/2015   Procedure: SAVORY DILATION;  Surgeon: Manya Silvas, MD;  Location: [facility] ENDOSCOPY;  Service: Endoscopy;  Laterality: N/A;  . TRABECULECTOMY Left 03/05/2016   Procedure: TRABECULECTOMY WITH [facility] AND EXPRESS SHUNT;  Surgeon: Leandrew Koyanagi, MD;  Location: [place];  Service: Ophthalmology;  Laterality: Left;  . TUBAL LIGATION      There were no vitals filed for this visit.  Subjective Assessment - 01/20/18 1114    Subjective  Patient reports "Today is not a good day." She reports increased back soreness and impaired balance/gait  instability; She reports "I think that when I get tired and the cold weather comes on then I just don't do well."     Pertinent History  72 yo Female with recent increase in back pain with subsequent numbness/tingling , weakness, and drop foot in B LEs, L>R starting about 3 months ago. She has had increased instability in gait and falls recently with minor injuries. Pt had recent epidural steroid injection L4-5, and L5-S1 with decrease in back pain since.Pt with extensive surgical and medical Hx including breast cancer (last surgery 2013), DJD with cervical fusion C3-5 in 2006, bipolar disorder, HTN, OA in bilateral shoulders L>R, hip and SIJ pain x >20 years.     Currently in Pain?  Yes    Pain Score  5     Pain Location  Back    Pain Orientation  Lower;Left    Pain Descriptors / Indicators  Aching;Sore    Pain Type  Chronic pain    Pain Onset  More than a month ago    Pain Frequency  Intermittent    Aggravating Factors   worse with cold weather, worse with prolonged car trips/prolonged sitting/walking    Pain Relieving Factors  rest/heat/pain meds    Effect of Pain on Daily Activities  decreased activity tolerance;     Multiple Pain Sites  No         [facility] PT Assessment - 01/20/18 0001      Strength   Right Hip Flexion  4/5    Right Hip ABduction  4/5    Right Hip ADduction  4/5    Left Hip Flexion  4/5    Left Hip ABduction  4/5    Left Hip ADduction  4/5    Right Knee Flexion  4/5    Right Knee Extension  4+/5    Left Knee Flexion  4-/5    Left Knee Extension  4/5    Right Ankle Dorsiflexion  3+/5    Left Ankle Dorsiflexion  3+/5      Standardized Balance Assessment   Five times sit to stand comments   15 sec without HHA (lower fall risk, improved from 20 sec on 12/28/17)    10 Meter Walk  1.17 m/s without AD, exhibiting community ambulator speed;       Functional Gait  Assessment   Gait Level Surface  Walks 20 ft in less than 7 sec but greater than 5.5 sec, uses assistive  device, slower speed, mild gait deviations, or deviates 6-10 in outside of the 12 in walkway width.    Change in Gait Speed  Able to smoothly change walking speed without loss of balance or gait deviation. Deviate no more than 6 in outside of the 12 in walkway width.    Gait with Horizontal Head Turns  Performs head turns smoothly with no change in gait. Deviates no more than 6 in outside 12 in walkway width    Gait with Vertical Head Turns  Performs task with slight change in gait velocity (eg, minor disruption to smooth gait path), deviates 6 - 10 in outside 12 in walkway width or uses assistive device    Gait and Pivot Turn  Pivot turns safely within 3 sec and stops quickly with no loss of balance.    Step Over Obstacle  Is able to step over one shoe box (4.5 in total height) but must slow down and adjust steps to clear box safely. May require verbal cueing.    Gait with Narrow Base of Support  Ambulates less than 4 steps heel to toe or cannot perform without assistance.    Gait with Eyes Closed  Walks 20 ft, uses assistive device, slower speed, mild gait deviations, deviates 6-10 in outside 12 in walkway width. Ambulates 20 ft in less than 9 sec but greater than 7 sec.    Ambulating Backwards  Walks 20 ft, uses assistive device, slower speed, mild gait deviations, deviates 6-10 in outside 12 in walkway width.    Steps  Alternating feet, must use rail.    Total Score  20        TREATMENT: Warm up on Nustep BUE/BLE level 2 x4 min (unbilled) concurrent with moist heat to low back;  Instructed patient in 5 times sit<>Stand, strength assessment and functional gait assessment; Patient exhibits improvement as compared to 1 month ago; Patient reports doing better since being in the pool;   Sitting: Hip flexion march with ankle DF x10 reps bilaterally;  Leg press; BLE plate 90# 2U23 reps with cues to slow down LE movement for better motor control;  Patient educated on plan of care; Reinforced  HEP with instruction for tband exercise and sit<>Stand safety;                   PT Education - 01/20/18 1116    Education provided  Yes    Education Details  LE strengthening, balance, progress towards goals;     Person(s) Educated  Patient    Methods  Explanation;Demonstration;Verbal cues    Comprehension  Verbalized understanding;Returned demonstration;Verbal cues required;Need further instruction       PT Short Term Goals - 01/20/18 1118      PT SHORT TERM GOAL #1   Title  Patient will deny any falls over past 4 weeks to demonstrate improved safety awareness at home and work.     Baseline  has multiple near misses; fell in [employer] about a week ago;     Time  4    Period  Weeks    Status  Not Met    Target Date  02/17/18      PT SHORT TERM GOAL #2   Title  Pt will obtain and appropriately use LRAD (Rollator suggested) in order to decraese fall risk and increase community access.     Baseline  Pt has obtained Rollator for use outside    Time  2    Period  Weeks    Status  Achieved        PT Long Term Goals - 01/20/18 1118      PT LONG TERM GOAL #1   Title  Patient will be independent in home exercise program to improve strength/mobility for better functional independence with ADLs.    Baseline  Pt does part  of her HEP about 3-4 times per week.    Time  4    Period  Weeks    Status  Partially Met    Target Date  02/17/18      PT LONG TERM GOAL #2   Title  Patient will increase Berg Balance score by > 6 points to demonstrate decreased fall risk during functional activities.; Deferred as patient's balance is more impaired dynamically and therefore FGA goal more appropriate;     Baseline  will defer at this time;     Time  4    Period  Weeks    Status  Deferred    Target Date  02/17/18      PT LONG TERM GOAL #3   Title  Patient (> 83 years old) will complete five times sit to stand test in < 15 seconds indicating an increased LE strength and  improved balance.    Baseline  15 sec without HHA    Time  4    Period  Weeks    Status  Achieved    Target Date  02/17/18      PT LONG TERM GOAL #4   Title  Patient will ascend/descend 4 stairs without rail assist independently without loss of balance to improve ability to get in/out of home.     Baseline  able to negotiate reciprocally but requires rail assist;     Time  4    Period  Weeks    Status  Partially Met    Target Date  02/17/18      PT LONG TERM GOAL #5   Title  Patient will increase BLE gross strength to 4/5 as to improve functional strength for independent gait, increased standing tolerance and increased ADL ability.    Baseline  ankle 3+/5    Time  4    Period  Weeks    Status  Partially Met    Target Date  02/17/18      PT LONG TERM GOAL #6   Title  Patient will increase Functional Gait Assessment score to >20/30 as to reduce fall risk and improve dynamic gait safety with community ambulation.    Baseline  20/30    Time  4    Period  Weeks    Status  Partially Met    Target Date  02/17/18            Plan - 01/20/18 1507    Clinical Impression Statement  Patient exhibits improved strength in each LE as compared to 1 month ago. In addition her sit<>Stand ability and dynamic balance has improved. She is still considered at an increased risk for falls and also exhibits continued weakness in each LE. She was instructed in LE strengthening with cues for better motor control. Patient exhibited increased foot drag during ambulation and reports increased unsteadiness due to today being "a bad day". She would benefit from additional skilled PT intervention to improve strength, balance and gait safety; Plan to continue with aquatic therapy as that seems to be helping. Patient agreeable.     Rehab Potential  Fair    Clinical Impairments Affecting Rehab Potential  Pt does not use AD and had decreased knowledge of her limitations    PT Frequency  2x / week    PT Duration   8 weeks    PT Treatment/Interventions  ADLs/Self Care Home Management;Aquatic Therapy;Biofeedback;Canalith Repostioning;Cryotherapy;Moist Heat;Cognitive remediation;Neuromuscular re-education;Balance training;Therapeutic exercise;Therapeutic activities;Functional mobility training;Stair training;Gait training;DME Instruction;Patient/family education;Orthotic Fit/Training;Manual techniques;Passive  range of motion;Visual/perceptual remediation/compensation;Vestibular;Energy conservation;Dry needling    PT Home Exercise Plan  continue as given;     Consulted and Agree with Plan of Care  Patient       Patient will benefit from skilled therapeutic intervention in order to improve the following deficits and impairments:  Abnormal gait, Decreased activity tolerance, Decreased balance, Decreased cognition, Decreased coordination, Decreased safety awareness, Decreased mobility, Decreased knowledge of use of DME, Decreased knowledge of precautions, Decreased endurance, Decreased skin integrity, Decreased strength, Difficulty walking, Dizziness, Postural dysfunction, Improper body mechanics, Impaired vision/preception, Impaired UE functional use, Impaired sensation, Hypomobility, Decreased range of motion, Impaired flexibility, Pain  Visit Diagnosis: Other lack of coordination  Other abnormalities of gait and mobility  Muscle weakness (generalized)     Problem List Patient Active Problem List   Diagnosis Date Noted  . Lumbar facet joint syndrome (Primary Area of Pain) (Bilateral) (L>R) 07/29/2017  . Chronic sacroiliac joint pain (Bilateral) (L>R) 07/29/2017  . Chronic hip pain (Bilateral) (L>R) 07/29/2017  . Chronic lower extremity pain (Secondary Area of Pain) (Bilateral) (L>R) 07/29/2017  . Impairment of balance 07/07/2017  . Menopausal and female climacteric states 06/15/2017  . Lumbar foraminal stenosis (T12-S1, multilevel) 06/10/2017  . Lumbar lateral recess stenosis (left L2-3, bilateral  L3-4, & right L4-5) 06/10/2017  . Grade 1 Anterolisthesis of L4 over L5 and L5 over S1 06/10/2017  . Lumbar facet hypertrophy (multilevel) 06/10/2017  . Lumbar Ligamentum flavum hypertrophy (HCC) (multilevel) 06/10/2017  . Dropfoot (Left) (L5 radiculopathy) 06/10/2017  . Neurogenic pain 06/09/2017  . DDD (degenerative disc disease), lumbar 06/09/2017  . 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 Closed fracture of metatarsal bone 05/06/2017  . 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 Abnormal MRI, cervical spine 05/06/2017  . 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[place]) 05/06/2017  . Cervical radiculitis 05/06/2017  . Chronic lumbar radiculopathy (Bilateral) (L>R) (left L5) 05/06/2017  . Chronic low back pain (Primary Area of Pain) (Bilateral) (L>R) 05/06/2017  . Abnormal MRI, lumbar spine 05/06/2017  . Chronic pain syndrome 03/19/2017  . Chronic shoulder pain (Fourth Area of Pain) (Bilateral) (L>R) 03/19/2017  . Osteoarthritis of shoulder (Bilateral) 03/19/2017  . Chronic neck pain [facility] Area of Pain) (Bilateral) (L>R) 03/19/2017  . History of C3-5 ACDF 03/19/2017  . Numbness and tingling in left upper extremity 03/19/2017  . Numbness and tingling of right upper extremity 03/19/2017  . Upper extremity weakness (Bilateral) (L>R) 03/19/2017  . Numbness of upper extremity (Bilateral) (L>R) 03/19/2017  . Numbness and tingling of lower extremity (Bilateral) (L>R) 03/19/2017  . Weakness of lower extremity (Bilateral) (R>L) 03/19/2017  . Chronic abdominal pain (epigastric) 03/19/2017  . Pituitary microadenoma ([place]) 12/08/2016  . History of breast cancer in adulthood 12/08/2016  . Dysmetria 12/08/2016  . Loss of weight 08/15/2015  . Colon polyp 05/25/2015  . Personal history of malignant neoplasm of breast 07/18/2014  . Breast cancer ([place]) 06/28/2014  . Bipolar disorder ([place]) 06/28/2014   . Chronic pancreatitis ([place]) 06/28/2014  . HTN (hypertension) 06/28/2014  . OA (osteoarthritis) 06/28/2014  . Renal insufficiency 06/28/2014  . DDD (degenerative disc disease), cervical 06/28/2014  . Hyperlipidemia 06/28/2014  . COPD (chronic obstructive pulmonary disease) ([place]) 05/17/2014  . Breast cancer of upper-outer quadrant of right female breast ([place]) 05/31/2008    Renee Erb PT, DPT 01/20/2018, 3:09 PM  [place] MAIN [facility] SERVICES [place], [place], [address]  Phone: [phone]   Fax:  [phone]  Name: JERRINE URSCHEL MRN: [id number] Date of Birth: 03-Jul-1946

## 2018-01-28 ENCOUNTER — Other Ambulatory Visit: Payer: Self-pay

## 2018-01-28 ENCOUNTER — Ambulatory Visit: Payer: Medicare Other | Attending: Pain Medicine

## 2018-01-28 DIAGNOSIS — R2689 Other abnormalities of gait and mobility: Secondary | ICD-10-CM | POA: Insufficient documentation

## 2018-01-28 DIAGNOSIS — R278 Other lack of coordination: Secondary | ICD-10-CM | POA: Diagnosis present

## 2018-01-28 DIAGNOSIS — M6281 Muscle weakness (generalized): Secondary | ICD-10-CM | POA: Diagnosis present

## 2018-01-28 NOTE — Therapy (Signed)
[place] MAIN [facility] SERVICES [address] [place], [place], [address] Phone: [phone]   Fax:  [phone]  Physical Therapy Treatment  Patient Details  Name: Elaine Clayton MRN: [id number] Date of Birth: 11/01/1946 Referring Provider: Dr. Joanna Hews    Encounter Date: 01/28/2018  PT End of Session - 01/28/18 1351    Visit Number  24    Number of Visits  49    Date for PT Re-Evaluation  02/17/18    PT Start Time  1120    PT Stop Time  1220    PT Time Calculation (min)  60 min    Activity Tolerance  Patient tolerated treatment well;No increased pain       Past Medical History:  Diagnosis Date  . Anemia   . Bipolar affective disorder ([place])    2  . Broken foot 2015   right foot  . Chronic gastritis   . Chronic kidney disease    Dr Holley Raring  . COPD (chronic obstructive pulmonary disease) ([place])   . Dizziness    inner ear (per pt) several times per week  . Dysphagia   . Glaucoma   . Hyperlipemia   . Hypertension    pt has recently come off BP meds.  MD aware. BP seems stable.  . Lithium toxicity 2015  . 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 Neck stiffness    s/p C3-C4 fusion, limited right turn  . Osteoarthritis    back  . Pancreatitis   . Personal history of malignant neoplasm of breast   . Pituitary microadenoma ([place])   . Recurrent UTI   . Renal insufficiency   . Stomach ulcer 2112    Past Surgical History:  Procedure Laterality Date  . APPENDECTOMY    . BREAST BIOPSY Right 2011   neg  . BREAST EXCISIONAL BIOPSY Right 2001   neg  . BREAST EXCISIONAL BIOPSY Right 2008   breast ca 2008 radiation  . BREAST EXCISIONAL BIOPSY Left 10/26/2012   neg  . BREAST SURGERY Left  2013   left breast wide excision,Intraductal papilloma, ductal hyperplasia and sclerosing adenosis. Microcalcifications associated with columnar cell change. No  evidence of atypia or malignancy. Margins are unremarkable.  Marland Kitchen BREAST SURGERY Right November 06, 1999   multiple areas of microcalcification showing evidence of sclerosing adenosis and ductal hyperplasia.  Marland Kitchen BREAST SURGERY Left May 07, 2007   wide excision.  . cataract Right 2013  . CATARACT EXTRACTION W/PHACO Left 03/05/2016   Procedure: CATARACT EXTRACTION PHACO AND INTRAOCULAR LENS PLACEMENT (IOC);  Surgeon: Leandrew Koyanagi, MD;  Location: [place];  Service: Ophthalmology;  Laterality: Left;  . COLONOSCOPY  2004  . COLONOSCOPY N/A 05/14/2015   Procedure: COLONOSCOPY;  Surgeon: Manya Silvas, MD;  Location: [facility] ENDOSCOPY;  Service: Endoscopy;  Laterality: N/A;  . ERCP N/A   . ESOPHAGOGASTRODUODENOSCOPY N/A 05/14/2015   Procedure: ESOPHAGOGASTRODUODENOSCOPY (EGD);  Surgeon: Manya Silvas, MD;  Location: [facility] ENDOSCOPY;  Service: Endoscopy;  Laterality: N/A;  . EUS N/A 2011, 2012  . EYE SURGERY  2013  . GLAUCOMA SURGERY Right 2013  . LAPAROSCOPIC RIGHT COLECTOMY Right 06/22/2015   Procedure: LAPAROSCOPIC RIGHT COLECTOMY;  Surgeon: Robert Bellow, MD;  Location: [facility] ORS;  Service: General;  Laterality: Right;  . NECK SURGERY  2006  . right breast cancer    . SAVORY DILATION N/A 05/14/2015  Procedure: SAVORY DILATION;  Surgeon: Manya Silvas, MD;  Location: [facility] ENDOSCOPY;  Service: Endoscopy;  Laterality: N/A;  . TRABECULECTOMY Left 03/05/2016   Procedure: TRABECULECTOMY WITH [facility] AND [facility] SHUNT;  Surgeon: Leandrew Koyanagi, MD;  Location: [place];  Service: Ophthalmology;  Laterality: Left;  . TUBAL LIGATION      There were no vitals filed for this visit.  Subjective Assessment - 01/28/18 1342    Subjective  Pt notes balance issues over last several days. Pt reports fall today down 4 steps due to carrying items in both hands and losing balance; pt denies any injury other than some bumps and bruises on B forearms. Pt states she did fall down onto  backside, but denies increased back or leg pain. Pt had taken her usual Vicodin today.     Pertinent History  72 yo Female with recent increase in back pain with subsequent numbness/tingling , weakness, and drop foot in B LEs, L>R starting about 3 months ago. She has had increased instability in gait and falls recently with minor injuries. Pt had recent epidural steroid injection L4-5, and L5-S1 with decrease in back pain since.Pt with extensive surgical and medical Hx including breast cancer (last surgery 2013), DJD with cervical fusion C3-5 in 2006, bipolar disorder, HTN, OA in bilateral shoulders L>R, hip and SIJ pain x >20 years.       Enters/exits pool via ramp Participates in the following  Ambulation with kick boards for UE support - 4 L fwd - 2 L side (poor balance today)  Rail core/LE strength - Hip abd/add, R/L 2 sets of 20 each; focus on stabilizer as well as kicks - Hip flex/ext, R/L 2 sets of 20 each; focus on stabilizer as well as kicks - Deep squats, 25x  Step - Partial step up R/L, 25x each - Alternating toe taps, 2 x 1 min each  - 1 with 1 UE support  - 1 without support  Rail LE strength/core - Sumo squats, 25x - Alternating side lunges, light UE support  Bench, core/LE with back supported - 5 min bicycle   Stretching, 3 x 10 sec each, B - Stand hamstrings/gastrocs - Stand quad - Education for side lying quad stretch on bed for home.                         PT Education - 01/28/18 1349    Education provided  Yes    Education Details  Balance/strength correlation, righting reactions. Managing exercises today and not increasing with wts as planned due to minor setback and avoiding any increased pain (as she may experience increased pain later in day due to fall)    Person(s) Educated  Patient       PT Short Term Goals - 01/20/18 1118      PT SHORT TERM GOAL #1   Title  Patient will deny any falls over past 4 weeks to demonstrate  improved safety awareness at home and work.     Baseline  has multiple near misses; fell in [employer] about a week ago;     Time  4    Period  Weeks    Status  Not Met    Target Date  02/17/18      PT SHORT TERM GOAL #2   Title  Pt will obtain and appropriately use LRAD (Rollator suggested) in order to decraese fall risk and increase community access.     Baseline  Pt has obtained Rollator for use outside    Time  2    Period  Weeks    Status  Achieved        PT Long Term Goals - 01/20/18 1118      PT LONG TERM GOAL #1   Title  Patient will be independent in home exercise program to improve strength/mobility for better functional independence with ADLs.    Baseline  Pt does part of her HEP about 3-4 times per week.    Time  4    Period  Weeks    Status  Partially Met    Target Date  02/17/18      PT LONG TERM GOAL #2   Title  Patient will increase Berg Balance score by > 6 points to demonstrate decreased fall risk during functional activities.; Deferred as patient's balance is more impaired dynamically and therefore FGA goal more appropriate;     Baseline  will defer at this time;     Time  4    Period  Weeks    Status  Deferred    Target Date  02/17/18      PT LONG TERM GOAL #3   Title  Patient (> 64 years old) will complete five times sit to stand test in < 15 seconds indicating an increased LE strength and improved balance.    Baseline  15 sec without HHA    Time  4    Period  Weeks    Status  Achieved    Target Date  02/17/18      PT LONG TERM GOAL #4   Title  Patient will ascend/descend 4 stairs without rail assist independently without loss of balance to improve ability to get in/out of home.     Baseline  able to negotiate reciprocally but requires rail assist;     Time  4    Period  Weeks    Status  Partially Met    Target Date  02/17/18      PT LONG TERM GOAL #5   Title  Patient will increase BLE gross strength to 4/5 as to improve functional strength  for independent gait, increased standing tolerance and increased ADL ability.    Baseline  ankle 3+/5    Time  4    Period  Weeks    Status  Partially Met    Target Date  02/17/18      PT LONG TERM GOAL #6   Title  Patient will increase Functional Gait Assessment score to >20/30 as to reduce fall risk and improve dynamic gait safety with community ambulation.    Baseline  20/30    Time  4    Period  Weeks    Status  Partially Met    Target Date  02/17/18            Plan - 01/28/18 1352    Clinical Impression Statement  Pt demonstrated difficulty with ambulation activity today especially side stepping with posterior drift and lean/excessive LOB; also increased lean/fall to the R with both fwd and side stepping. Improved ability to shift weight with strength activities at the rail. Focus on stabilizing leg     Rehab Potential  Fair    Clinical Impairments Affecting Rehab Potential  Pt does not use AD and had decreased knowledge of her limitations    PT Frequency  2x / week    PT Duration  4 weeks    PT Treatment/Interventions  ADLs/Self Care Home Management;Aquatic Therapy;Biofeedback;Canalith Repostioning;Cryotherapy;Moist Heat;Cognitive remediation;Neuromuscular re-education;Balance training;Therapeutic exercise;Therapeutic activities;Functional mobility training;Stair training;Gait training;DME Instruction;Patient/family education;Orthotic Fit/Training;Manual techniques;Passive range of motion;Visual/perceptual remediation/compensation;Vestibular;Energy conservation;Dry needling    PT Home Exercise Plan  continue as given;     Consulted and Agree with Plan of Care  Patient       Patient will benefit from skilled therapeutic intervention in order to improve the following deficits and impairments:  Abnormal gait, Decreased activity tolerance, Decreased balance, Decreased cognition, Decreased coordination, Decreased safety awareness, Decreased mobility, Decreased knowledge of use of  DME, Decreased knowledge of precautions, Decreased endurance, Decreased skin integrity, Decreased strength, Difficulty walking, Dizziness, Postural dysfunction, Improper body mechanics, Impaired vision/preception, Impaired UE functional use, Impaired sensation, Hypomobility, Decreased range of motion, Impaired flexibility, Pain  Visit Diagnosis: Other lack of coordination  Other abnormalities of gait and mobility  Muscle weakness (generalized)     Problem List Patient Active Problem List   Diagnosis Date Noted  . Lumbar facet joint syndrome (Primary Area of Pain) (Bilateral) (L>R) 07/29/2017  . Chronic sacroiliac joint pain (Bilateral) (L>R) 07/29/2017  . Chronic hip pain (Bilateral) (L>R) 07/29/2017  . Chronic lower extremity pain (Secondary Area of Pain) (Bilateral) (L>R) 07/29/2017  . Impairment of balance 07/07/2017  . Menopausal and female climacteric states 06/15/2017  . Lumbar foraminal stenosis (T12-S1, multilevel) 06/10/2017  . Lumbar lateral recess stenosis (left L2-3, bilateral L3-4, & right L4-5) 06/10/2017  . Grade 1 Anterolisthesis of L4 over L5 and L5 over S1 06/10/2017  . Lumbar facet hypertrophy (multilevel) 06/10/2017  . Lumbar Ligamentum flavum hypertrophy (HCC) (multilevel) 06/10/2017  . Dropfoot (Left) (L5 radiculopathy) 06/10/2017  . Neurogenic pain 06/09/2017  . DDD (degenerative disc disease), lumbar 06/09/2017  . 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 Closed fracture of metatarsal bone 05/06/2017  . 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 Abnormal MRI, cervical spine 05/06/2017  . 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[place]) 05/06/2017  . Cervical radiculitis 05/06/2017  . Chronic lumbar radiculopathy (Bilateral) (L>R) (left L5) 05/06/2017  . Chronic low back pain (Primary Area of Pain) (Bilateral) (L>R) 05/06/2017  . Abnormal MRI, lumbar spine  05/06/2017  . Chronic pain syndrome 03/19/2017  . Chronic shoulder pain (Fourth Area of Pain) (Bilateral) (L>R) 03/19/2017  . Osteoarthritis of shoulder (Bilateral) 03/19/2017  . Chronic neck pain [facility] Area of Pain) (Bilateral) (L>R) 03/19/2017  . History of C3-5 ACDF 03/19/2017  . Numbness and tingling in left upper extremity 03/19/2017  . Numbness and tingling of right upper extremity 03/19/2017  . Upper extremity weakness (Bilateral) (L>R) 03/19/2017  . Numbness of upper extremity (Bilateral) (L>R) 03/19/2017  . Numbness and tingling of lower extremity (Bilateral) (L>R) 03/19/2017  . Weakness of lower extremity (Bilateral) (R>L) 03/19/2017  . Chronic abdominal pain (epigastric) 03/19/2017  . Pituitary microadenoma ([place]) 12/08/2016  . History of breast cancer in adulthood 12/08/2016  . Dysmetria 12/08/2016  . Loss of weight 08/15/2015  . Colon polyp 05/25/2015  . Personal history of malignant neoplasm of breast 07/18/2014  . Breast cancer ([place]) 06/28/2014  . Bipolar disorder ([place]) 06/28/2014  . Chronic pancreatitis ([place]) 06/28/2014  . HTN (hypertension) 06/28/2014  . OA (osteoarthritis) 06/28/2014  . Renal insufficiency 06/28/2014  . DDD (degenerative disc disease), cervical 06/28/2014  . Hyperlipidemia 06/28/2014  . COPD (chronic obstructive pulmonary disease) ([place]) 05/17/2014  . Breast cancer of upper-outer quadrant of right female breast ([place]) 05/31/2008    Larae Grooms  01/28/2018, 1:56 PM  [place] MAIN [facility] SERVICES [address] [place], [place], [address] Phone: [phone]   Fax:  [phone]  Name: Elaine Clayton MRN: [id number] Date of Birth: 1945/12/28

## 2018-02-02 ENCOUNTER — Encounter: Payer: Self-pay | Admitting: Pain Medicine

## 2018-02-02 ENCOUNTER — Ambulatory Visit (HOSPITAL_BASED_OUTPATIENT_CLINIC_OR_DEPARTMENT_OTHER): Payer: Medicare Other | Admitting: Pain Medicine

## 2018-02-02 ENCOUNTER — Ambulatory Visit
Admission: RE | Admit: 2018-02-02 | Discharge: 2018-02-02 | Disposition: A | Discharge status: Home / Self Care | Payer: Medicare Other | Source: Ambulatory Visit | Attending: Pain Medicine | Admitting: Pain Medicine

## 2018-02-02 ENCOUNTER — Other Ambulatory Visit: Payer: Self-pay

## 2018-02-02 VITALS — BP 158/75 | HR 79 | Temp 97.7°F | Resp 14 | Ht 64.5 in | Wt 105.0 lb

## 2018-02-02 DIAGNOSIS — G894 Chronic pain syndrome: Secondary | ICD-10-CM

## 2018-02-02 DIAGNOSIS — M5441 Lumbago with sciatica, right side: Secondary | ICD-10-CM | POA: Insufficient documentation

## 2018-02-02 DIAGNOSIS — M533 Sacrococcygeal disorders, not elsewhere classified: Secondary | ICD-10-CM | POA: Insufficient documentation

## 2018-02-02 DIAGNOSIS — Z853 Personal history of malignant neoplasm of breast: Secondary | ICD-10-CM | POA: Diagnosis not present

## 2018-02-02 DIAGNOSIS — M47896 Other spondylosis, lumbar region: Secondary | ICD-10-CM | POA: Insufficient documentation

## 2018-02-02 DIAGNOSIS — H269 Unspecified cataract: Secondary | ICD-10-CM | POA: Insufficient documentation

## 2018-02-02 DIAGNOSIS — M545 Low back pain: Secondary | ICD-10-CM | POA: Diagnosis not present

## 2018-02-02 DIAGNOSIS — M542 Cervicalgia: Secondary | ICD-10-CM | POA: Diagnosis not present

## 2018-02-02 DIAGNOSIS — M47816 Spondylosis without myelopathy or radiculopathy, lumbar region: Secondary | ICD-10-CM | POA: Insufficient documentation

## 2018-02-02 DIAGNOSIS — M792 Neuralgia and neuritis, unspecified: Secondary | ICD-10-CM

## 2018-02-02 DIAGNOSIS — G8929 Other chronic pain: Secondary | ICD-10-CM

## 2018-02-02 DIAGNOSIS — M5442 Lumbago with sciatica, left side: Secondary | ICD-10-CM | POA: Diagnosis not present

## 2018-02-02 MED ORDER — ROPIVACAINE HCL 2 MG/ML IJ SOLN
9.0000 mL | Freq: Once | INTRAMUSCULAR | Status: AC
Start: 2018-02-02 — End: 2018-02-02
  Administered 2018-02-02: 10 mL via PERINEURAL
  Filled 2018-02-02: qty 10

## 2018-02-02 MED ORDER — MIDAZOLAM HCL 5 MG/5ML IJ SOLN
1.0000 mg | INTRAMUSCULAR | Status: DC | PRN
  Administered 2018-02-02: 1.5 mg via INTRAVENOUS
  Filled 2018-02-02: qty 5

## 2018-02-02 MED ORDER — HYDROCODONE-ACETAMINOPHEN 5-325 MG PO TABS: 1.0000 | ORAL_TABLET | Freq: Four times a day (QID) | ORAL | 0 refills | Status: DC | PRN

## 2018-02-02 MED ORDER — LACTATED RINGERS IV SOLN
1000.0000 mL | Freq: Once | INTRAVENOUS | Status: AC
  Administered 2018-02-02: 1000 mL via INTRAVENOUS

## 2018-02-02 MED ORDER — LIDOCAINE HCL 2 % IJ SOLN
10.0000 mL | Freq: Once | INTRAMUSCULAR | Status: AC
  Administered 2018-02-02: 400 mg
  Filled 2018-02-02: qty 40

## 2018-02-02 MED ORDER — TRIAMCINOLONE ACETONIDE 40 MG/ML IJ SUSP
40.0000 mg | Freq: Once | INTRAMUSCULAR | Status: AC
  Administered 2018-02-02: 40 mg
  Filled 2018-02-02: qty 1

## 2018-02-02 MED ORDER — ROPIVACAINE HCL 2 MG/ML IJ SOLN
9.0000 mL | Freq: Once | INTRAMUSCULAR | Status: AC
  Administered 2018-02-02: 10 mL via PERINEURAL
  Filled 2018-02-02: qty 10

## 2018-02-02 MED ORDER — FENTANYL CITRATE (PF) 100 MCG/2ML IJ SOLN
25.0000 ug | INTRAMUSCULAR | Status: DC | PRN
  Administered 2018-02-02: 50 ug via INTRAVENOUS
  Filled 2018-02-02: qty 2

## 2018-02-02 MED ORDER — GABAPENTIN 100 MG PO CAPS: 100.0000 mg | ORAL_CAPSULE | Freq: Every day | ORAL | 0 refills | Status: DC

## 2018-02-02 NOTE — Patient Instructions (Addendum)
_________Pre procedure instructions given for return procedure.  You have been given a script for Hydrocodone for post RF pain.       Radiofrequency Lesioning Radiofrequency lesioning is a procedure that is performed to relieve pain. The procedure is often used for back, neck, or arm pain. Radiofrequency lesioning involves the use of a machine that creates radio waves to make heat. During the procedure, the heat is applied to the nerve that carries the pain signal. The heat damages the nerve and interferes with the pain signal. Pain relief usually starts about 2 weeks after the procedure and lasts for 6 months to 1 year. Tell a health care provider about:  Any allergies you have.  All medicines you are taking, including vitamins, herbs, eye drops, creams, and over-the-counter medicines.  Any problems you or family members have had with anesthetic medicines.  Any blood disorders you have.  Any surgeries you have had.  Any medical conditions you have.  Whether you are pregnant or may be pregnant. What are the risks? Generally, this is a safe procedure. However, problems may occur, including:  Pain or soreness at the injection site.  Infection at the injection site.  Damage to nerves or blood vessels.  What happens before the procedure?  Ask your health care provider about: ? Changing or stopping your regular medicines. This is especially important if you are taking diabetes medicines or blood thinners. ? Taking medicines such as aspirin and ibuprofen. These medicines can thin your blood. Do not take these medicines before your procedure if your health care provider instructs you not to.  Follow instructions from your health care provider about eating or drinking restrictions.  Plan to have someone take you home after the procedure.  If you go home right after the procedure, plan to have someone with you for 24 hours. What happens during the procedure?  You will be given one  or more of the following: ? A medicine to help you relax (sedative). ? A medicine to numb the area (local anesthetic).  You will be awake during the procedure. You will need to be able to talk with the health care provider during the procedure.  With the help of a type of X-ray (fluoroscopy), the health care provider will insert a radiofrequency needle into the area to be treated.  Next, a wire that carries the radio waves (electrode) will be put through the radiofrequency needle. An electrical pulse will be sent through the electrode to verify the correct nerve. You will feel a tingling sensation, and you may have muscle twitching.  Then, the tissue that is around the needle tip will be heated by an electric current that is passed using the radiofrequency machine. This will numb the nerves.  A bandage (dressing) will be put on the insertion area after the procedure is done. The procedure may vary among health care providers and hospitals. What happens after the procedure?  Your blood pressure, heart rate, breathing rate, and blood oxygen level will be monitored often until the medicines you were given have worn off.  Return to your normal activities as directed by your health care provider. This information is not intended to replace advice given to you by your health care provider. Make sure you discuss any questions you have with your health care provider. Document Released: 08/06/2011 Document Revised: 05/15/2016 Document Reviewed: 01/15/2015 Elsevier Interactive Patient Education  2018 [employer]. ___________________________________________________________________________________  Post-Procedure instructions Instructions:  Apply ice: Fill a plastic sandwich bag with  crushed ice. Cover it with a small towel and apply to injection site. Apply for 15 minutes then remove x 15 minutes. Repeat sequence on day of procedure, until you go to bed. The purpose is to minimize swelling and  discomfort after procedure.  Apply heat: Apply heat to procedure site starting the day following the procedure. The purpose is to treat any soreness and discomfort from the procedure.  Food intake: Start with clear liquids (like water) and advance to regular food, as tolerated.   Physical activities: Keep activities to a minimum for the first 8 hours after the procedure.   Driving: If you have received any sedation, you are not allowed to drive for 24 hours after your procedure.  Blood thinner: Restart your blood thinner 6 hours after your procedure. (Only for those taking blood thinners)  Insulin: As soon as you can eat, you may resume your normal dosing schedule. (Only for those taking insulin)  Infection prevention: Keep procedure site clean and dry.  Post-procedure Pain Diary: Extremely important that this be done correctly and accurately. Recorded information will be used to determine the next step in treatment.  Pain evaluated is that of treated area only. Do not include pain from an untreated area.  Complete every hour, on the hour, for the initial 8 hours. Set an alarm to help you do this part accurately.  Do not go to sleep and have it completed later. It will not be accurate.  Follow-up appointment: Keep your follow-up appointment after the procedure. Usually 2 weeks for most procedures. (6 weeks in the case of radiofrequency.) Bring you pain diary.  Expect:  From numbing medicine (AKA: Local Anesthetics): Numbness or decrease in pain.  Onset: Full effect within 15 minutes of injected.  Duration: It will depend on the type of local anesthetic used. On the average, 1 to 8 hours.   From steroids: Decrease in swelling or inflammation. Once inflammation is improved, relief of the pain will follow.  Onset of benefits: Depends on the amount of swelling present. The more swelling, the longer it will take for the benefits to be seen. In some cases, up to 10 days.  Duration:  Steroids will stay in the system x 2 weeks. Duration of benefits will depend on multiple posibilities including persistent irritating factors.  From procedure: Some discomfort is to be expected once the numbing medicine wears off. This should be minimal if ice and heat are applied as instructed. Call if:  You experience numbness and weakness that gets worse with time, as opposed to wearing off.  New onset bowel or bladder incontinence. (Spinal procedures only)  Emergency Numbers:  Durning business hours (Monday - Thursday, 8:00 AM - 4:00 PM) (Friday, 9:00 AM - 12:00 Noon): [phone]  After hours: [phone] ____________________________________________________________________________________________   ____________________________________________________________________________________________  Preparing for Procedure with Sedation Instructions: . Oral Intake: Do not eat or drink anything for at least 8 hours prior to your procedure. . Transportation: Public transportation is not allowed. Bring an adult driver. The driver must be physically present in our waiting room before any procedure can be started. Marland Kitchen Physical Assistance: Bring an adult physically capable of assisting you, in the event you need help. This adult should keep you company at home for at least 6 hours after the procedure. . Blood Pressure Medicine: Take your blood pressure medicine with a sip of water the morning of the procedure. . Blood thinners:  . Diabetics on insulin: Notify the staff so that you  can be scheduled 1st case in the morning. If your diabetes requires high dose insulin, take only  of your normal insulin dose the morning of the procedure and notify the staff that you have done so. . Preventing infections: Shower with an antibacterial soap the morning of your procedure. . Build-up your immune system: Take 1000 mg of Vitamin C with every meal (3 times a day) the day prior to your  procedure. Marland Kitchen Antibiotics: Inform the staff if you have a condition or reason that requires you to take antibiotics before dental procedures. . Pregnancy: If you are pregnant, call and cancel the procedure. . Sickness: If you have a cold, fever, or any active infections, call and cancel the procedure. . Arrival: You must be in the facility at least 30 minutes prior to your scheduled procedure. . Children: Do not bring children with you. . Dress appropriately: Bring dark clothing that you would not mind if they get stained. . Valuables: Do not bring any jewelry or valuables. Procedure appointments are reserved for interventional treatments only. Marland Kitchen No Prescription Refills. . No medication changes will be discussed during procedure appointments. . No disability issues will be discussed. ____________________________________________________________________________________________

## 2018-02-02 NOTE — Progress Notes (Signed)
Patient's Name: Elaine Clayton  MRN: [id number]  Referring Provider: Idelle Crouch, MD  DOB: [birth year]  PCP: Idelle Crouch, MD  DOS: 02/02/2018  Note by: Gaspar Cola, MD  Service setting: Ambulatory outpatient  Specialty: Interventional Pain Management  Patient type: Established  Location: [facility] (AMB) Pain Management Facility  Visit type: Interventional Procedure   Primary Reason for Visit: Interventional Pain Management Treatment. CC: Back Pain (lower) and Neck Pain (mid)  Procedure:  Anesthesia, Analgesia, Anxiolysis:  Type: Therapeutic Medial Branch Facet & Sacroiliac joint Radiofrequency Ablation Region: Lumbosacral Level: L2, L3, L4, L5, S1, S2, & S3 Medial Branch Level(s) Laterality: Left-Sided  Type: Local Anesthesia with Moderate (Conscious) Sedation Local Anesthetic: Lidocaine 1% Route: Intravenous (IV) IV Access: Secured Sedation: Meaningful verbal contact was maintained at all times during the procedure  Indication(s): Analgesia and Anxiety   Indications: 1. Lumbar facet joint syndrome (Primary Area of Pain) (Bilateral) (L>R)   2. Lumbar facet hypertrophy (multilevel)   3. Chronic sacroiliac joint pain (Bilateral) (L>R)   4. Chronic low back pain (Primary Area of Pain) (Bilateral) (L>R)   5. Chronic pain syndrome   6. Neurogenic pain    Elaine Clayton has been dealing with the above chronic pain for longer than three months and has either failed to respond, was unable to tolerate, or simply did not get enough benefit from other more conservative therapies including, but not limited to: 1. Over-the-counter medications 2. Anti-inflammatory medications 3. Muscle relaxants 4. Membrane stabilizers 5. Opioids 6. Physical therapy 7. Modalities (Heat, ice, etc.) 8. Invasive techniques such as nerve blocks. Elaine Clayton has attained more than 50% relief of the pain from a series of diagnostic injections conducted in separate occasions.  Pain Score: Pre-procedure: 4  /10 Post-procedure: 0-No pain/10  Pre-op Assessment:  Elaine Clayton is a 72 y.o. (year old), female patient, seen today for interventional treatment. She  has a past surgical history that includes Colonoscopy (2004); Neck surgery (2006); Eye surgery (2013); Appendectomy; Tubal ligation; Colonoscopy (N/A, 05/14/2015); Esophagogastroduodenoscopy (N/A, 05/14/2015); Savory dilation (N/A, 05/14/2015); ERCP (N/A); Glaucoma surgery (Right, 2013); cataract (Right, 2013); Breast surgery (Left,  2013); Breast surgery (Right, November 06, 1999); Breast surgery (Left, May 07, 2007); EUS (N/A, 2011, 2012); Laparoscopic right colectomy (Right, 06/22/2015); right breast cancer; Trabeculectomy (Left, 03/05/2016); Cataract extraction w/PHACO (Left, 03/05/2016); Breast biopsy (Right, 2011); Breast excisional biopsy (Right, 2001); Breast excisional biopsy (Right, 2008); and Breast excisional biopsy (Left, 10/26/2012). Elaine Clayton has a current medication list which includes the following prescription(s): alprazolam, aspirin ec, azopt, bisacodyl, brimonidine tartrate-timolol, bupropion, dextromethorphan, escitalopram, estradiol-levonorgestrel, fluticasone furoate-vilanterol, gabapentin, hydrocodone-acetaminophen, ipratropium, losartan, lumigan, nicotine polacrilex, ondansetron, rosuvastatin, and ventolin hfa, and the following Facility-Administered Medications: fentanyl and midazolam. Her primarily concern today is the Back Pain (lower) and Neck Pain (mid)  Initial Vital Signs:  Pulse Rate: 79 Temp: (!) 97.2 F (36.2 C) Resp: 16 BP: (!) 153/68 SpO2: 100 %  BMI: Estimated body mass index is 17.74 kg/m as calculated from the following:   Height as of this encounter: 5' 4.5" (1.638 m).   Weight as of this encounter: 105 lb (47.6 kg).  Risk Assessment: Allergies: Reviewed. She is allergic to influenza vaccines; flu virus vaccine; sulfa antibiotics; and tetracyclines & related.  Allergy Precautions: None required Coagulopathies:  Reviewed. None identified.  Blood-thinner therapy: None at this time Active Infection(s): Reviewed. None identified. Elaine Clayton is afebrile  Site Confirmation: Elaine Clayton was asked to confirm the procedure and laterality before marking the site Procedure  checklist: Completed Consent: Before the procedure and under the influence of no sedative(s), amnesic(s), or anxiolytics, the patient was informed of the treatment options, risks and possible complications. To fulfill our ethical and legal obligations, as recommended by the American Medical Association's Code of Ethics, I have informed the patient of my clinical impression; the nature and purpose of the treatment or procedure; the risks, benefits, and possible complications of the intervention; the alternatives, including doing nothing; the risk(s) and benefit(s) of the alternative treatment(s) or procedure(s); and the risk(s) and benefit(s) of doing nothing. The patient was provided information about the general risks and possible complications associated with the procedure. These may include, but are not limited to: failure to achieve desired goals, infection, bleeding, organ or nerve damage, allergic reactions, paralysis, and death. In addition, the patient was informed of those risks and complications associated to Spine-related procedures, such as failure to decrease pain; infection (i.e.: Meningitis, epidural or intraspinal abscess); bleeding (i.e.: epidural hematoma, subarachnoid hemorrhage, or any other type of intraspinal or peri-dural bleeding); organ or nerve damage (i.e.: Any type of peripheral nerve, nerve root, or spinal cord injury) with subsequent damage to sensory, motor, and/or autonomic systems, resulting in permanent pain, numbness, and/or weakness of one or several areas of the body; allergic reactions; (i.e.: anaphylactic reaction); and/or death. Furthermore, the patient was informed of those risks and complications associated with  the medications. These include, but are not limited to: allergic reactions (i.e.: anaphylactic or anaphylactoid reaction(s)); adrenal axis suppression; blood sugar elevation that in diabetics may result in ketoacidosis or comma; water retention that in patients with history of congestive heart failure may result in shortness of breath, pulmonary edema, and decompensation with resultant heart failure; weight gain; swelling or edema; medication-induced neural toxicity; particulate matter embolism and blood vessel occlusion with resultant organ, and/or nervous system infarction; and/or aseptic necrosis of one or more joints. Finally, the patient was informed that Medicine is not an exact science; therefore, there is also the possibility of unforeseen or unpredictable risks and/or possible complications that may result in a catastrophic outcome. The patient indicated having understood very clearly. We have given the patient no guarantees and we have made no promises. Enough time was given to the patient to ask questions, all of which were answered to the patient's satisfaction. Ms. Coppock has indicated that she wanted to continue with the procedure. Attestation: I, the ordering provider, attest that I have discussed with the patient the benefits, risks, side-effects, alternatives, likelihood of achieving goals, and potential problems during recovery for the procedure that I have provided informed consent. Date: 02/02/2018  Time: N/A  Pre-Procedure Preparation:  Monitoring: As per clinic protocol. Respiration, ETCO2, SpO2, BP, heart rate and rhythm monitor placed and checked for adequate function Safety Precautions: Patient was assessed for positional comfort and pressure points before starting the procedure. Time-out: I initiated and conducted the "Time-out" before starting the procedure, as per protocol. The patient was asked to participate by confirming the accuracy of the "Time Out" information. Verification  of the correct person, site, and procedure were performed and confirmed by me, the nursing staff, and the patient. "Time-out" conducted as per Joint Commission's Universal Protocol (UP.01.01.01). "Time-out" Date & Time: 02/02/2018; 1145 hrs.  Description of Procedure Process:   Target Area: For Lumbar Facet blocks, the target is the groove formed by the junction of the transverse process and superior articular process. For the L5 dorsal ramus, the target is the notch between superior articular process and sacral  ala. For the Sacral dorsal rami, the target is the superior and lateral edge of the posterior S1, S2, & S3 Sacral foramen. Approach: Paraspinal approach. Area Prepped: Entire Posterior Lumbosacral Region Prepping solution: Hibiclens (4.0% Chlorhexidine gluconate solution) Safety Precautions: Aspiration looking for blood return was conducted prior to all injections. At no point did we inject any substances, as a needle was being advanced. No attempts were made at seeking any paresthesias. Safe injection practices and needle disposal techniques used. Medications properly checked for expiration dates. SDV (single dose vial) medications used. Description of the Procedure: Protocol guidelines were followed. The patient was placed in position over the procedure table. The target area was identified and the area prepped in the usual manner. The skin and muscle were infiltrated with local anesthetic. Appropriate amount of time allowed to pass for local anesthetics to take effect. Radiofrequency needles were introduced to the target area using fluoroscopic guidance. Using the NeuroTherm NT1100 Radiofrequency Generator, sensory stimulation using 50 Hz was used to locate & identify the nerve, making sure that the needle was positioned such that there was no sensory stimulation below 0.3 V or above 0.7 V. Stimulation using 2 Hz was used to evaluate the motor component. Care was taken not to lesion any nerves  that demonstrated motor stimulation of the lower extremities at an output of less than 2.5 times that of the sensory threshold, or a maximum of 2.0 V. Once satisfactory placement of the needles was achieved, the numbing solution was slowly injected after negative aspiration. After waiting for at least 2 minutes, the ablation was performed at 80 degrees C for 60 seconds, using regular Radiofrequency settings. Once the procedure was completed, the needles were then removed and the area cleansed, making sure to leave some of the prepping solution back to take advantage of its long term bactericidal properties. Intra-operative Compliance: Compliant Vitals:   02/02/18 1230 02/02/18 1238 02/02/18 1248 02/02/18 1259  BP: (!) 156/89 (!) 155/98 (!) 155/86 (!) 158/75  Pulse:      Resp: 11 18 16 14   Temp:  97.9 F (36.6 C)  97.7 F (36.5 C)  SpO2: 97% 100% 99% 99%  Weight:      Height:        Start Time: 1145 hrs. End Time: 1226 hrs. Materials & Medications:  Needle(s) Type: Teflon-coated, curved tip, Radiofrequency needle(s) Gauge: 22G Length: 10cm Medication(s): Please see orders for medications and dosing details.  Imaging Guidance (Spinal):  Type of Imaging Technique: Fluoroscopy Guidance (Spinal) Indication(s): Assistance in needle guidance and placement for procedures requiring needle placement in or near specific anatomical locations not easily accessible without such assistance. Exposure Time: Please see nurses notes. Contrast: None used. Fluoroscopic Guidance: I was personally present during the use of fluoroscopy. "Tunnel Vision Technique" used to obtain the best possible view of the target area. Parallax error corrected before commencing the procedure. "Direction-depth-direction" technique used to introduce the needle under continuous pulsed fluoroscopy. Once target was reached, antero-posterior, oblique, and lateral fluoroscopic projection used confirm needle placement in all planes.  Images permanently stored in EMR. Interpretation: No contrast injected. I personally interpreted the imaging intraoperatively. Adequate needle placement confirmed in multiple planes. Permanent images saved into the patient's record.  Antibiotic Prophylaxis:   Anti-infectives (From admission, onward)   None     Indication(s): None identified  Post-operative Assessment:  Post-procedure Vital Signs:  Pulse Rate: 79 Temp: 97.7 F (36.5 C) Resp: 14 BP: (!) 158/75 SpO2: 99 %  EBL: None  Complications: No immediate post-treatment complications observed by team, or reported by patient.  Note: The patient tolerated the entire procedure well. A repeat set of vitals were taken after the procedure and the patient was kept under observation following institutional policy, for this type of procedure. Post-procedural neurological assessment was performed, showing return to baseline, prior to discharge. The patient was provided with post-procedure discharge instructions, including a section on how to identify potential problems. Should any problems arise concerning this procedure, the patient was given instructions to immediately contact us, at any time, without hesitation. In any case, we plan to contact the patient by telephone for a follow-up status report regarding this interventional procedure.  Comments:  No additional relevant information.  Plan of Care   Possible POC:  Schedule the patient to return in approximately 2 weeks for a right-sided lumbar facet + right-sided sacroiliac joint RFA under fluoroscopic guidance and IV sedation.   Imaging Orders     DG C-Arm 1-60 Min-No Report  Procedure Orders     Radiofrequency,Lumbar     Radiofrequency Sacroiliac Joint     Radiofrequency,Lumbar     Radiofrequency Sacroiliac Joint  Medications ordered for procedure: Meds ordered this encounter  Medications  . midazolam (VERSED) 5 MG/5ML injection 1-2 mg    Make sure Flumazenil is available  in the pyxis when using this medication. If oversedation occurs, administer 0.2 mg IV over 15 sec. If after 45 sec no response, administer 0.2 mg again over 1 min; may repeat at 1 min intervals; not to exceed 4 doses (1 mg)  . fentaNYL (SUBLIMAZE) injection 25-50 mcg    Make sure Narcan is available in the pyxis when using this medication. In the event of respiratory depression (RR< 8/min): Titrate NARCAN (naloxone) in increments of 0.1 to 0.2 mg IV at 2-3 minute intervals, until desired degree of reversal.  . lactated ringers infusion 1,000 mL  . lidocaine (XYLOCAINE) 2 % (with pres) injection 200 mg  . ropivacaine (PF) 2 mg/mL (0.2%) (NAROPIN) injection 9 mL  . triamcinolone acetonide (KENALOG-40) injection 40 mg  . ropivacaine (PF) 2 mg/mL (0.2%) (NAROPIN) injection 9 mL  . triamcinolone acetonide (KENALOG-40) injection 40 mg  . HYDROcodone-acetaminophen (NORCO/VICODIN) 5-325 MG tablet    Sig: Take 1 tablet by mouth every 6 (six) hours as needed for moderate pain.    Dispense:  60 tablet    Refill:  0    Fill one day early if pharmacy is closed on scheduled refill date. Do not fill until: 02/02/18 To last until: 03/04/18  . gabapentin (NEURONTIN) 100 MG capsule    Sig: Take 1-3 capsules (100-300 mg total) by mouth at bedtime.    Dispense:  90 capsule    Refill:  0    Do not place this medication, or any other prescription from our practice, on "Automatic Refill". Patient may have prescription filled one day early if pharmacy is closed on scheduled refill date.   Medications administered: We administered midazolam, fentaNYL, lactated ringers, lidocaine, ropivacaine (PF) 2 mg/mL (0.2%), triamcinolone acetonide, ropivacaine (PF) 2 mg/mL (0.2%), and triamcinolone acetonide.  See the medical record for exact dosing, route, and time of administration.  New Prescriptions   No medications on file   Disposition: Discharge home  Discharge Date & Time: 02/02/2018; 1300 hrs.    Physician-requested Follow-up: Return for contralateral RFA (2 wks): (R) L-FCT + (R) SI RFA.  Future Appointments  Date Time Provider [place]  02/04/2018 11:15 AM Larae Grooms, PTA  [facility] None  02/09/2018 10:30 AM Drema Dallas, Heidi E, PTA [facility] None  02/11/2018 11:15 AM Cleophus Molt E, PTA [facility] None  02/16/2018 10:30 AM Cleophus Molt E, PTA [facility] None  02/18/2018 11:15 AM Cleophus Molt E, PTA [facility] None  02/22/2018  1:00 PM Hillis Range, PT [facility] None  03/24/2018  2:00 PM McGowan, Marlowe Kays None   Primary Care Physician: Idelle Crouch, MD Location: [facility] Note by: Gaspar Cola, MD Date: 02/02/2018; Time: 1:26 PM  Disclaimer:  Medicine is not an Chief Strategy Officer. The only guarantee in medicine is that nothing is guaranteed. It is important to note that the decision to proceed with this intervention was based on the information collected from the patient. The Data and conclusions were drawn from the patient's questionnaire, the interview, and the physical examination. Because the information was provided in large part by the patient, it cannot be guaranteed that it has not been purposely or unconsciously manipulated. Every effort has been made to obtain as much relevant data as possible for this evaluation. It is important to note that the conclusions that lead to this procedure are derived in large part from the available data. Always take into account that the treatment will also be dependent on availability of resources and existing treatment guidelines, considered by other Pain Management Practitioners as being common knowledge and practice, at the time of the intervention. For Medico-Legal purposes, it is also important to point out that variation in procedural techniques and pharmacological choices are the acceptable norm. The indications, contraindications, technique, and results of the above procedure  should only be interpreted and judged by a Board-Certified Interventional Pain Specialist with extensive familiarity and expertise in the same exact procedure and technique.

## 2018-02-03 ENCOUNTER — Telehealth: Payer: Self-pay

## 2018-02-03 NOTE — Telephone Encounter (Signed)
No answer. Left message and instructed to call if needed.

## 2018-02-04 ENCOUNTER — Ambulatory Visit: Payer: Medicare Other

## 2018-02-04 ENCOUNTER — Other Ambulatory Visit: Payer: Self-pay

## 2018-02-04 DIAGNOSIS — R2689 Other abnormalities of gait and mobility: Secondary | ICD-10-CM

## 2018-02-04 DIAGNOSIS — M6281 Muscle weakness (generalized): Secondary | ICD-10-CM

## 2018-02-04 DIAGNOSIS — R278 Other lack of coordination: Secondary | ICD-10-CM | POA: Diagnosis not present

## 2018-02-04 NOTE — Therapy (Signed)
[place] MAIN [facility] SERVICES [address] [place], [place], [address] Phone: [phone]   Fax:  [phone]  Physical Therapy Treatment  Patient Details  Name: Elaine Clayton MRN: [id number] Date of Birth: 03-04-1946 Referring Provider: Dr. Joanna Hews    Encounter Date: 02/04/2018  PT End of Session - 02/04/18 1407    Visit Number  25    Number of Visits  49    Date for PT Re-Evaluation  02/17/18    PT Start Time  1120    PT Stop Time  1225    PT Time Calculation (min)  65 min    Activity Tolerance  Patient tolerated treatment well;No increased pain    Behavior During Therapy  WFL for tasks assessed/performed       Past Medical History:  Diagnosis Date  . Anemia   . Bipolar affective disorder ([name])    2  . Broken foot 2015   right foot  . Chronic gastritis   . Chronic kidney disease    Dr Holley Raring  . COPD (chronic obstructive pulmonary disease) ([place])   . Dizziness    inner ear (per pt) several times per week  . Dysphagia   . Glaucoma   . Hyperlipemia   . Hypertension    pt has recently come off BP meds.  MD aware. BP seems stable.  . Lithium toxicity 2015  . 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 Neck stiffness    s/p C3-C4 fusion, limited right turn  . Osteoarthritis    back  . Pancreatitis   . Personal history of malignant neoplasm of breast   . Pituitary microadenoma ([place])   . Recurrent UTI   . Renal insufficiency   . Stomach ulcer 2112    Past Surgical History:  Procedure Laterality Date  . APPENDECTOMY    . BREAST BIOPSY Right 2011   neg  . BREAST EXCISIONAL BIOPSY Right 2001   neg  . BREAST EXCISIONAL BIOPSY Right 2008   breast ca 2008 radiation  . BREAST EXCISIONAL BIOPSY Left 10/26/2012   neg  . BREAST SURGERY Left  2013   left breast wide excision,Intraductal papilloma, ductal hyperplasia and sclerosing adenosis.  Microcalcifications associated with columnar cell change. No evidence of atypia or malignancy. Margins are unremarkable.  Marland Kitchen BREAST SURGERY Right November 06, 1999   multiple areas of microcalcification showing evidence of sclerosing adenosis and ductal hyperplasia.  Marland Kitchen BREAST SURGERY Left May 07, 2007   wide excision.  . cataract Right 2013  . CATARACT EXTRACTION W/PHACO Left 03/05/2016   Procedure: CATARACT EXTRACTION PHACO AND INTRAOCULAR LENS PLACEMENT (IOC);  Surgeon: Leandrew Koyanagi, MD;  Location: [place];  Service: Ophthalmology;  Laterality: Left;  . COLONOSCOPY  2004  . COLONOSCOPY N/A 05/14/2015   Procedure: COLONOSCOPY;  Surgeon: Manya Silvas, MD;  Location: [facility] ENDOSCOPY;  Service: Endoscopy;  Laterality: N/A;  . ERCP N/A   . ESOPHAGOGASTRODUODENOSCOPY N/A 05/14/2015   Procedure: ESOPHAGOGASTRODUODENOSCOPY (EGD);  Surgeon: Manya Silvas, MD;  Location: [facility]COPY;  Service: Endoscopy;  Laterality: N/A;  . EUS N/A 2011, 2012  . EYE SURGERY  2013  . GLAUCOMA SURGERY Right 2013  . LAPAROSCOPIC RIGHT COLECTOMY Right 06/22/2015   Procedure: LAPAROSCOPIC RIGHT COLECTOMY;  Surgeon: Robert Bellow, MD;  Location: [facility] ORS;  Service: General;  Laterality: Right;  . NECK SURGERY  2006  . right  breast cancer    . SAVORY DILATION N/A 05/14/2015   Procedure: SAVORY DILATION;  Surgeon: Manya Silvas, MD;  Location: [facility] ENDOSCOPY;  Service: Endoscopy;  Laterality: N/A;  . TRABECULECTOMY Left 03/05/2016   Procedure: TRABECULECTOMY WITH [facility] AND EXPRESS SHUNT;  Surgeon: Leandrew Koyanagi, MD;  Location: [place];  Service: Ophthalmology;  Laterality: Left;  . TUBAL LIGATION      There were no vitals filed for this visit.  Subjective Assessment - 02/04/18 1400    Subjective  Pt reports having RFD, radiofrequency facet denervation, to 9 areas bilaterally earlier in the week. Pt tolerated well without side effects to date. Back observed, no open or  irritated skin noted. Pt has no voiced complaints today, no new falls.     Pertinent History  72 yo Female with recent increase in back pain with subsequent numbness/tingling , weakness, and drop foot in B LEs, L>R starting about 3 months ago. She has had increased instability in gait and falls recently with minor injuries. Pt had recent epidural steroid injection L4-5, and L5-S1 with decrease in back pain since.Pt with extensive surgical and medical Hx including breast cancer (last surgery 2013), DJD with cervical fusion C3-5 in 2006, bipolar disorder, HTN, OA in bilateral shoulders L>R, hip and SIJ pain x >20 years.       Ambulation with kick boards - 4 L fwd - 4 L side  Stand core/LE work at rail, 20x ea - hip abd/add - hip flex/ext - March, no UE support - Side lunge, R/L no UE support - Fwd lunge, R/L no UE support  - Sumo squat - SLS, 5x each holds between 10 and 25 sec, mild turbulence  Bench  - STS no UE, 25x  Core with UE strengthening at wall, shoulders just out of water - green dumbbells, 20x ea  - triceps press downs  - sh abd/add  - sh flex/ext, 1 dumbbell - Horizontal abd/add, shoulders in water, speed for resistance                        PT Education - 02/04/18 1403    Education provided  Yes    Education Details  Core stabilization, POSTURE with ambulation and throughout exercises. Combining strength and balance through functional movement strength exercises    Person(s) Educated  Patient    Methods  Explanation;Demonstration;Verbal cues    Comprehension  Verbalized understanding;Verbal cues required;Need further instruction       PT Short Term Goals - 01/20/18 1118      PT SHORT TERM GOAL #1   Title  Patient will deny any falls over past 4 weeks to demonstrate improved safety awareness at home and work.     Baseline  has multiple near misses; fell in [employer] about a week ago;     Time  4    Period  Weeks    Status  Not Met     Target Date  02/17/18      PT SHORT TERM GOAL #2   Title  Pt will obtain and appropriately use LRAD (Rollator suggested) in order to decraese fall risk and increase community access.     Baseline  Pt has obtained Rollator for use outside    Time  2    Period  Weeks    Status  Achieved        PT Long Term Goals - 01/20/18 1118      PT LONG  TERM GOAL #1   Title  Patient will be independent in home exercise program to improve strength/mobility for better functional independence with ADLs.    Baseline  Pt does part of her HEP about 3-4 times per week.    Time  4    Period  Weeks    Status  Partially Met    Target Date  02/17/18      PT LONG TERM GOAL #2   Title  Patient will increase Berg Balance score by > 6 points to demonstrate decreased fall risk during functional activities.; Deferred as patient's balance is more impaired dynamically and therefore FGA goal more appropriate;     Baseline  will defer at this time;     Time  4    Period  Weeks    Status  Deferred    Target Date  02/17/18      PT LONG TERM GOAL #3   Title  Patient (> 60 years old) will complete five times sit to stand test in < 15 seconds indicating an increased LE strength and improved balance.    Baseline  15 sec without HHA    Time  4    Period  Weeks    Status  Achieved    Target Date  02/17/18      PT LONG TERM GOAL #4   Title  Patient will ascend/descend 4 stairs without rail assist independently without loss of balance to improve ability to get in/out of home.     Baseline  able to negotiate reciprocally but requires rail assist;     Time  4    Period  Weeks    Status  Partially Met    Target Date  02/17/18      PT LONG TERM GOAL #5   Title  Patient will increase BLE gross strength to 4/5 as to improve functional strength for independent gait, increased standing tolerance and increased ADL ability.    Baseline  ankle 3+/5    Time  4    Period  Weeks    Status  Partially Met    Target Date   02/17/18      PT LONG TERM GOAL #6   Title  Patient will increase Functional Gait Assessment score to >20/30 as to reduce fall risk and improve dynamic gait safety with community ambulation.    Baseline  20/30    Time  4    Period  Weeks    Status  Partially Met    Target Date  02/17/18            Plan - 02/04/18 1408    Clinical Impression Statement  Pt showing improvement with balance/strength activities with ability to perform without UE support with intermittent use of single UE support to regain balance when lost, also able to self correct.     Rehab Potential  Fair    Clinical Impairments Affecting Rehab Potential  Pt does not use AD and had decreased knowledge of her limitations    PT Frequency  2x / week    PT Duration  4 weeks    PT Treatment/Interventions  ADLs/Self Care Home Management;Aquatic Therapy;Biofeedback;Canalith Repostioning;Cryotherapy;Moist Heat;Cognitive remediation;Neuromuscular re-education;Balance training;Therapeutic exercise;Therapeutic activities;Functional mobility training;Stair training;Gait training;DME Instruction;Patient/family education;Orthotic Fit/Training;Manual techniques;Passive range of motion;Visual/perceptual remediation/compensation;Vestibular;Energy conservation;Dry needling    PT Home Exercise Plan  continue as given;     Consulted and Agree with Plan of Care  Patient       Patient will benefit  from skilled therapeutic intervention in order to improve the following deficits and impairments:  Abnormal gait, Decreased activity tolerance, Decreased balance, Decreased cognition, Decreased coordination, Decreased safety awareness, Decreased mobility, Decreased knowledge of use of DME, Decreased knowledge of precautions, Decreased endurance, Decreased skin integrity, Decreased strength, Difficulty walking, Dizziness, Postural dysfunction, Improper body mechanics, Impaired vision/preception, Impaired UE functional use, Impaired sensation,  Hypomobility, Decreased range of motion, Impaired flexibility, Pain  Visit Diagnosis: Other lack of coordination  Other abnormalities of gait and mobility  Muscle weakness (generalized)     Problem List Patient Active Problem List   Diagnosis Date Noted  . Ataxic gait 11/20/2017  . Gait instability 11/20/2017  . 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 Cognitive decline 11/20/2017  . PBA (pseudobulbar affect) 11/20/2017  . Truncal ataxia 11/20/2017  . Lumbar facet joint syndrome (Primary Area of Pain) (Bilateral) (L>R) 07/29/2017  . Chronic sacroiliac joint pain (Bilateral) (L>R) 07/29/2017  . Chronic hip pain (Bilateral) (L>R) 07/29/2017  . Chronic lower extremity pain (Secondary Area of Pain) (Bilateral) (L>R) 07/29/2017  . Impairment of balance 07/07/2017  . Menopausal and female climacteric states 06/15/2017  . Lumbar foraminal stenosis (T12-S1, multilevel) 06/10/2017  . Lumbar lateral recess stenosis (left L2-3, bilateral L3-4, & right L4-5) 06/10/2017  . Grade 1 Anterolisthesis of L4 over L5 and L5 over S1 06/10/2017  . Lumbar facet hypertrophy (multilevel) 06/10/2017  . Lumbar Ligamentum flavum hypertrophy (HCC) (multilevel) 06/10/2017  . Dropfoot (Left) (L5 radiculopathy) 06/10/2017  . Neurogenic pain 06/09/2017  . DDD (degenerative disc disease), lumbar 06/09/2017  . 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 Closed fracture of metatarsal bone 05/06/2017  . 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 Abnormal MRI, cervical spine 05/06/2017  . 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[place]) 05/06/2017  . Cervical radiculitis 05/06/2017  . Chronic lumbar radiculopathy (Bilateral) (L>R) (left L5) 05/06/2017  . Chronic low back pain (Primary Area of Pain) (Bilateral) (L>R) 05/06/2017  . Abnormal MRI, lumbar spine 05/06/2017  . Chronic pain syndrome 03/19/2017  . Chronic  shoulder pain (Fourth Area of Pain) (Bilateral) (L>R) 03/19/2017  . Osteoarthritis of shoulder (Bilateral) 03/19/2017  . Chronic neck pain [facility] Area of Pain) (Bilateral) (L>R) 03/19/2017  . History of C3-5 ACDF 03/19/2017  . Numbness and tingling in left upper extremity 03/19/2017  . Numbness and tingling of right upper extremity 03/19/2017  . Upper extremity weakness (Bilateral) (L>R) 03/19/2017  . Numbness of upper extremity (Bilateral) (L>R) 03/19/2017  . Numbness and tingling of lower extremity (Bilateral) (L>R) 03/19/2017  . Weakness of lower extremity (Bilateral) (R>L) 03/19/2017  . Chronic abdominal pain (epigastric) 03/19/2017  . Pituitary microadenoma ([place]) 12/08/2016  . History of breast cancer in adulthood 12/08/2016  . Dysmetria 12/08/2016  . Loss of weight 08/15/2015  . Colon polyp 05/25/2015  . Personal history of malignant neoplasm of breast 07/18/2014  . Breast cancer ([place]) 06/28/2014  . Bipolar disorder ([place]) 06/28/2014  . Chronic pancreatitis ([place]) 06/28/2014  . HTN (hypertension) 06/28/2014  . OA (osteoarthritis) 06/28/2014  . Renal insufficiency 06/28/2014  . DDD (degenerative disc disease), cervical 06/28/2014  . Hyperlipidemia 06/28/2014  . COPD (chronic obstructive pulmonary disease) ([place]) 05/17/2014  . Breast cancer of upper-outer quadrant of right female breast ([place]) 05/31/2008    Larae Grooms 02/04/2018, 2:23 PM  [place] MAIN [facility] SERVICES [place], [place], [address] Phone: [phone]   Fax:  [phone]  Name: SUMER MOOREHOUSE MRN: [id number] Date of Birth: 02/29/1946

## 2018-02-09 ENCOUNTER — Ambulatory Visit: Payer: Medicare Other

## 2018-02-09 DIAGNOSIS — R278 Other lack of coordination: Secondary | ICD-10-CM | POA: Diagnosis not present

## 2018-02-09 DIAGNOSIS — R2689 Other abnormalities of gait and mobility: Secondary | ICD-10-CM

## 2018-02-09 DIAGNOSIS — M6281 Muscle weakness (generalized): Secondary | ICD-10-CM

## 2018-02-10 ENCOUNTER — Ambulatory Visit: Payer: Medicare Other | Admitting: Neurology

## 2018-02-11 ENCOUNTER — Other Ambulatory Visit: Payer: Self-pay

## 2018-02-11 ENCOUNTER — Ambulatory Visit: Payer: Medicare Other

## 2018-02-11 DIAGNOSIS — R278 Other lack of coordination: Secondary | ICD-10-CM | POA: Diagnosis not present

## 2018-02-11 DIAGNOSIS — M6281 Muscle weakness (generalized): Secondary | ICD-10-CM

## 2018-02-11 DIAGNOSIS — R2689 Other abnormalities of gait and mobility: Secondary | ICD-10-CM

## 2018-02-11 NOTE — Therapy (Signed)
[place] MAIN [facility] SERVICES [address] [place], [place], [address] Phone: [phone]   Fax:  [phone]  Physical Therapy Treatment  Patient Details  Name: Elaine Clayton MRN: [id number] Date of Birth: Sep 03, 1946 Referring Provider: Dr. Joanna Hews    Encounter Date: 02/09/2018  PT End of Session - 02/11/18 1620    Visit Number  26    Number of Visits  49    Date for PT Re-Evaluation  02/17/18    PT Start Time  1030    PT Stop Time  1125    PT Time Calculation (min)  55 min    Activity Tolerance  Patient tolerated treatment well;No increased pain    Behavior During Therapy  WFL for tasks assessed/performed       Past Medical History:  Diagnosis Date  . Anemia   . Bipolar affective disorder ([place])    2  . Broken foot 2015   right foot  . Chronic gastritis   . Chronic kidney disease    Dr Holley Raring  . COPD (chronic obstructive pulmonary disease) ([place])   . Dizziness    inner ear (per pt) several times per week  . Dysphagia   . Glaucoma   . Hyperlipemia   . Hypertension    pt has recently come off BP meds.  MD aware. BP seems stable.  . Lithium toxicity 2015  . 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 Neck stiffness    s/p C3-C4 fusion, limited right turn  . Osteoarthritis    back  . Pancreatitis   . Personal history of malignant neoplasm of breast   . Pituitary microadenoma ([place])   . Recurrent UTI   . Renal insufficiency   . Stomach ulcer 2112    Past Surgical History:  Procedure Laterality Date  . APPENDECTOMY    . BREAST BIOPSY Right 2011   neg  . BREAST EXCISIONAL BIOPSY Right 2001   neg  . BREAST EXCISIONAL BIOPSY Right 2008   breast ca 2008 radiation  . BREAST EXCISIONAL BIOPSY Left 10/26/2012   neg  . BREAST SURGERY Left  2013   left breast wide excision,Intraductal papilloma, ductal hyperplasia and sclerosing adenosis.  Microcalcifications associated with columnar cell change. No evidence of atypia or malignancy. Margins are unremarkable.  Marland Kitchen BREAST SURGERY Right November 06, 1999   multiple areas of microcalcification showing evidence of sclerosing adenosis and ductal hyperplasia.  Marland Kitchen BREAST SURGERY Left May 07, 2007   wide excision.  . cataract Right 2013  . CATARACT EXTRACTION W/PHACO Left 03/05/2016   Procedure: CATARACT EXTRACTION PHACO AND INTRAOCULAR LENS PLACEMENT (IOC);  Surgeon: Leandrew Koyanagi, MD;  Location: [place];  Service: Ophthalmology;  Laterality: Left;  . COLONOSCOPY  2004  . COLONOSCOPY N/A 05/14/2015   Procedure: COLONOSCOPY;  Surgeon: Manya Silvas, MD;  Location: [facility] ENDOSCOPY;  Service: Endoscopy;  Laterality: N/A;  . ERCP N/A   . ESOPHAGOGASTRODUODENOSCOPY N/A 05/14/2015   Procedure: ESOPHAGOGASTRODUODENOSCOPY (EGD);  Surgeon: Manya Silvas, MD;  Location: [facility] ENDOSCOPY;  Service: Endoscopy;  Laterality: N/A;  . EUS N/A 2011, 2012  . EYE SURGERY  2013  . GLAUCOMA SURGERY Right 2013  . LAPAROSCOPIC RIGHT COLECTOMY Right 06/22/2015   Procedure: LAPAROSCOPIC RIGHT COLECTOMY;  Surgeon: Robert Bellow, MD;  Location: [facility] ORS;  Service: General;  Laterality: Right;  . NECK SURGERY  2006  . right  breast cancer    . SAVORY DILATION N/A 05/14/2015   Procedure: SAVORY DILATION;  Surgeon: Manya Silvas, MD;  Location: [facility] ENDOSCOPY;  Service: Endoscopy;  Laterality: N/A;  . TRABECULECTOMY Left 03/05/2016   Procedure: TRABECULECTOMY WITH [facility] AND [facility];  Surgeon: Leandrew Koyanagi, MD;  Location: [place];  Service: Ophthalmology;  Laterality: Left;  . TUBAL LIGATION      There were no vitals filed for this visit.  Subjective Assessment - 02/11/18 1618    Subjective  No voiced complaints; feels she is seeing some benefits from spinal treatments with mildly less pain.     Pertinent History  72 yo Female with recent increase in back pain with  subsequent numbness/tingling , weakness, and drop foot in B LEs, L>R starting about 3 months ago. She has had increased instability in gait and falls recently with minor injuries. Pt had recent epidural steroid injection L4-5, and L5-S1 with decrease in back pain since.Pt with extensive surgical and medical Hx including breast cancer (last surgery 2013), DJD with cervical fusion C3-5 in 2006, bipolar disorder, HTN, OA in bilateral shoulders L>R, hip and SIJ pain x >20 years.       Enters/exits pool via ramp Participates in the following.   Ambulation with KB - 4 L fwd - 4 L side  Mobile step - R/L step ups, 30x ea - SL squat with opposite heel tap, 20x  Bench core/LE strength, 3# - Bicycle, 3 min - SLR return to ABD, 2 x 10 B  Rail strengthening, 3# - B hip ext, 2 x 10 ea  Bench core/LE/balance - STS, no UE support, 20x                         PT Education - 02/11/18 1620    Education provided  Yes    Education Details  Continued on core stability and posture       PT Short Term Goals - 01/20/18 1118      PT SHORT TERM GOAL #1   Title  Patient will deny any falls over past 4 weeks to demonstrate improved safety awareness at home and work.     Baseline  has multiple near misses; fell in [employer] about a week ago;     Time  4    Period  Weeks    Status  Not Met    Target Date  02/17/18      PT SHORT TERM GOAL #2   Title  Pt will obtain and appropriately use LRAD (Rollator suggested) in order to decraese fall risk and increase community access.     Baseline  Pt has obtained Rollator for use outside    Time  2    Period  Weeks    Status  Achieved        PT Long Term Goals - 01/20/18 1118      PT LONG TERM GOAL #1   Title  Patient will be independent in home exercise program to improve strength/mobility for better functional independence with ADLs.    Baseline  Pt does part of her HEP about 3-4 times per week.    Time  4    Period  Weeks     Status  Partially Met    Target Date  02/17/18      PT LONG TERM GOAL #2   Title  Patient will increase Berg Balance score by > 6 points to demonstrate  decreased fall risk during functional activities.; Deferred as patient's balance is more impaired dynamically and therefore FGA goal more appropriate;     Baseline  will defer at this time;     Time  4    Period  Weeks    Status  Deferred    Target Date  02/17/18      PT LONG TERM GOAL #3   Title  Patient (> 49 years old) will complete five times sit to stand test in < 15 seconds indicating an increased LE strength and improved balance.    Baseline  15 sec without HHA    Time  4    Period  Weeks    Status  Achieved    Target Date  02/17/18      PT LONG TERM GOAL #4   Title  Patient will ascend/descend 4 stairs without rail assist independently without loss of balance to improve ability to get in/out of home.     Baseline  able to negotiate reciprocally but requires rail assist;     Time  4    Period  Weeks    Status  Partially Met    Target Date  02/17/18      PT LONG TERM GOAL #5   Title  Patient will increase BLE gross strength to 4/5 as to improve functional strength for independent gait, increased standing tolerance and increased ADL ability.    Baseline  ankle 3+/5    Time  4    Period  Weeks    Status  Partially Met    Target Date  02/17/18      PT LONG TERM GOAL #6   Title  Patient will increase Functional Gait Assessment score to >20/30 as to reduce fall risk and improve dynamic gait safety with community ambulation.    Baseline  20/30    Time  4    Period  Weeks    Status  Partially Met    Target Date  02/17/18            Plan - 02/11/18 1518    Clinical Impression Statement  Greater focus on balance today with challange using noodle for UE support. RLE weakness noted with extension exercise today more so than R, which may have contribulted to decreased balance. Pt does require cues to increase focus on  task at hand, which does improve balance and form. It is also noted that when pt begins to lose balance, pt tucks hips with and anterior shift of pelvis further adding to retropulsive lean.     Rehab Potential  Fair    Clinical Impairments Affecting Rehab Potential  Pt does not use AD and had decreased knowledge of her limitations    PT Frequency  2x / week    PT Duration  4 weeks    PT Treatment/Interventions  ADLs/Self Care Home Management;Aquatic Therapy;Biofeedback;Canalith Repostioning;Cryotherapy;Moist Heat;Cognitive remediation;Neuromuscular re-education;Balance training;Therapeutic exercise;Therapeutic activities;Functional mobility training;Stair training;Gait training;DME Instruction;Patient/family education;Orthotic Fit/Training;Manual techniques;Passive range of motion;Visual/perceptual remediation/compensation;Vestibular;Energy conservation;Dry needling    PT Home Exercise Plan  continue as given;     Consulted and Agree with Plan of Care  Patient       Patient will benefit from skilled therapeutic intervention in order to improve the following deficits and impairments:  Abnormal gait, Decreased activity tolerance, Decreased balance, Decreased cognition, Decreased coordination, Decreased safety awareness, Decreased mobility, Decreased knowledge of use of DME, Decreased knowledge of precautions, Decreased endurance, Decreased skin integrity, Decreased strength, Difficulty walking, Dizziness,  Postural dysfunction, Improper body mechanics, Impaired vision/preception, Impaired UE functional use, Impaired sensation, Hypomobility, Decreased range of motion, Impaired flexibility, Pain  Visit Diagnosis: Other lack of coordination  Other abnormalities of gait and mobility  Muscle weakness (generalized)     Problem List Patient Active Problem List   Diagnosis Date Noted  . Ataxic gait 11/20/2017  . Gait instability 11/20/2017  . 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 Cognitive  decline 11/20/2017  . PBA (pseudobulbar affect) 11/20/2017  . Truncal ataxia 11/20/2017  . Lumbar facet joint syndrome (Primary Area of Pain) (Bilateral) (L>R) 07/29/2017  . Chronic sacroiliac joint pain (Bilateral) (L>R) 07/29/2017  . Chronic hip pain (Bilateral) (L>R) 07/29/2017  . Chronic lower extremity pain (Secondary Area of Pain) (Bilateral) (L>R) 07/29/2017  . Impairment of balance 07/07/2017  . Menopausal and female climacteric states 06/15/2017  . Lumbar foraminal stenosis (T12-S1, multilevel) 06/10/2017  . Lumbar lateral recess stenosis (left L2-3, bilateral L3-4, & right L4-5) 06/10/2017  . Grade 1 Anterolisthesis of L4 over L5 and L5 over S1 06/10/2017  . Lumbar facet hypertrophy (multilevel) 06/10/2017  . Lumbar Ligamentum flavum hypertrophy (HCC) (multilevel) 06/10/2017  . Dropfoot (Left) (L5 radiculopathy) 06/10/2017  . Neurogenic pain 06/09/2017  . DDD (degenerative disc disease), lumbar 06/09/2017  . 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 Closed fracture of metatarsal bone 05/06/2017  . 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 Abnormal MRI, cervical spine 05/06/2017  . 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[place]) 05/06/2017  . Cervical radiculitis 05/06/2017  . Chronic lumbar radiculopathy (Bilateral) (L>R) (left L5) 05/06/2017  . Chronic low back pain (Primary Area of Pain) (Bilateral) (L>R) 05/06/2017  . Abnormal MRI, lumbar spine 05/06/2017  . Chronic pain syndrome 03/19/2017  . Chronic shoulder pain (Fourth Area of Pain) (Bilateral) (L>R) 03/19/2017  . Osteoarthritis of shoulder (Bilateral) 03/19/2017  . Chronic neck pain [facility] Area of Pain) (Bilateral) (L>R) 03/19/2017  . History of C3-5 ACDF 03/19/2017  . Numbness and tingling in left upper extremity 03/19/2017  . Numbness and tingling of right upper extremity 03/19/2017  . Upper extremity weakness  (Bilateral) (L>R) 03/19/2017  . Numbness of upper extremity (Bilateral) (L>R) 03/19/2017  . Numbness and tingling of lower extremity (Bilateral) (L>R) 03/19/2017  . Weakness of lower extremity (Bilateral) (R>L) 03/19/2017  . Chronic abdominal pain (epigastric) 03/19/2017  . Pituitary microadenoma ([place]) 12/08/2016  . History of breast cancer in adulthood 12/08/2016  . Dysmetria 12/08/2016  . Loss of weight 08/15/2015  . Colon polyp 05/25/2015  . Personal history of malignant neoplasm of breast 07/18/2014  . Breast cancer ([place]) 06/28/2014  . Bipolar disorder ([place]) 06/28/2014  . Chronic pancreatitis ([place]) 06/28/2014  . HTN (hypertension) 06/28/2014  . OA (osteoarthritis) 06/28/2014  . Renal insufficiency 06/28/2014  . DDD (degenerative disc disease), cervical 06/28/2014  . Hyperlipidemia 06/28/2014  . COPD (chronic obstructive pulmonary disease) ([place]) 05/17/2014  . Breast cancer of upper-outer quadrant of right female breast ([place]) 05/31/2008    Larae Grooms 02/11/2018, 4:24 PM  [place] MAIN [facility] SERVICES [address] [place], [place], [address] Phone: [phone]   Fax:  [phone]  Name: THAMARA LEGER MRN: [id number] Date of Birth: 10-16-1946

## 2018-02-11 NOTE — Therapy (Addendum)
[place] MAIN [facility] SERVICES [address] [place], [place], [address] Phone: [phone]   Fax:  [phone]  Physical Therapy Treatment  Patient Details  Name: Elaine Clayton MRN: [id number] Date of Birth: [birth year] Referring Provider: Dr. Joanna Hews    Encounter Date: 02/11/2018  PT End of Session - 02/11/18 1641    Visit Number  27    Number of Visits  49    Date for PT Re-Evaluation  02/17/18    PT Start Time  1120    PT Stop Time  1220    PT Time Calculation (min)  60 min       Past Medical History:  Diagnosis Date  . Anemia   . Bipolar affective disorder ([place])    72  . Broken foot 2015   right foot  . Chronic gastritis   . Chronic kidney disease    Dr Holley Raring  . COPD (chronic obstructive pulmonary disease) ([place])   . Dizziness    inner ear (per pt) several times per week  . Dysphagia   . Glaucoma   . Hyperlipemia   . Hypertension    pt has recently come off BP meds.  MD aware. BP seems stable.  . Lithium toxicity 2015  . 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 Neck stiffness    s/p C3-C4 fusion, limited right turn  . Osteoarthritis    back  . Pancreatitis   . Personal history of malignant neoplasm of breast   . Pituitary microadenoma ([place])   . Recurrent UTI   . Renal insufficiency   . Stomach ulcer 2112    Past Surgical History:  Procedure Laterality Date  . APPENDECTOMY    . BREAST BIOPSY Right 2011   neg  . BREAST EXCISIONAL BIOPSY Right 2001   neg  . BREAST EXCISIONAL BIOPSY Right 2008   breast ca 2008 radiation  . BREAST EXCISIONAL BIOPSY Left 10/26/2012   neg  . BREAST SURGERY Left  2013   left breast wide excision,Intraductal papilloma, ductal hyperplasia and sclerosing adenosis. Microcalcifications associated with columnar cell change. No evidence of atypia or malignancy. Margins are unremarkable.  Marland Kitchen BREAST SURGERY  Right November 06, 1999   multiple areas of microcalcification showing evidence of sclerosing adenosis and ductal hyperplasia.  Marland Kitchen BREAST SURGERY Left May 07, 2007   wide excision.  . cataract Right 2013  . CATARACT EXTRACTION W/PHACO Left 03/05/2016   Procedure: CATARACT EXTRACTION PHACO AND INTRAOCULAR LENS PLACEMENT (IOC);  Surgeon: Leandrew Koyanagi, MD;  Location: [place];  Service: Ophthalmology;  Laterality: Left;  . COLONOSCOPY  2004  . COLONOSCOPY N/A 05/14/2015   Procedure: COLONOSCOPY;  Surgeon: Manya Silvas, MD;  Location[facility] ENDOSCOPY;  Service: Endoscopy;  Laterality: N/A;  . ERCP N/A   . ESOPHAGOGASTRODUODENOSCOPY N/A 05/14/2015   Procedure: ESOPHAGOGASTRODUODENOSCOPY (EGD);  Surgeon: Manya Silvas, MD;  Location[facility] ENDOSCOPY;  Service: Endoscopy;  Laterality: N/A;  . EUS N/A 2011, 2012  . EYE SURGERY  2013  . GLAUCOMA SURGERY Right 2013  . LAPAROSCOPIC RIGHT COLECTOMY Right 06/22/2015   Procedure: LAPAROSCOPIC RIGHT COLECTOMY;  Surgeon: Robert Bellow, MD;  Location: [facility] ORS;  Service: General;  Laterality: Right;  . NECK SURGERY  2006  . right breast cancer    . SAVORY DILATION N/A 05/14/2015   Procedure: SAVORY DILATION;  Surgeon: Manya Silvas, MD;  Location:  [place] ENDOSCOPY;  Service: Endoscopy;  Laterality: N/A;  . TRABECULECTOMY Left 03/05/2016   Procedure: TRABECULECTOMY WITH [facility] AND [facility];  Surgeon: Leandrew Koyanagi, MD;  Location: [place];  Service: Ophthalmology;  Laterality: Left;  . TUBAL LIGATION      There were no vitals filed for this visit.  Subjective Assessment - 02/11/18 1618    Subjective  No voiced complaints; feels she is seeing some benefits from spinal treatments with mildly less pain.     Pertinent History  72 yo Female with recent increase in back pain with subsequent numbness/tingling , weakness, and drop foot in B LEs, L>R starting about 3 months ago. She has had increased instability in gait and  falls recently with minor injuries. Pt had recent epidural steroid injection L4-5, and L5-S1 with decrease in back pain since.Pt with extensive surgical and medical Hx including breast cancer (last surgery 2013), DJD with cervical fusion C3-5 in 2006, bipolar disorder, HTN, OA in bilateral shoulders L>R, hip and SIJ pain x >20 years.       Enters/exits pool via ramp Participates in the following  Ambulation  - Fwd 4 L, with KB - Side 4 L, with KB Challenge, increased time - Side with squat with noodle, Min to Mod A, 2 L,  - High knee march with noodle, 4 L,  - Bkwd walk with noodle, 2 L   Core with LE strengthening at rail - Hip abd/add, 25x - Hip flex/ext, 25x  - Squats, 30x  Bench - Seated hip ext with noodle, 3 x 10 R/L                           PT Education - 02/11/18 1620    Education provided  Yes    Education Details  Continued on core stability and posture       PT Short Term Goals - 01/20/18 1118      PT SHORT TERM GOAL #1   Title  Patient will deny any falls over past 4 weeks to demonstrate improved safety awareness at home and work.     Baseline  has multiple near misses; fell in [employer] about a week ago;     Time  4    Period  Weeks    Status  Not Met    Target Date  02/17/18      PT SHORT TERM GOAL #2   Title  Pt will obtain and appropriately use LRAD (Rollator suggested) in order to decraese fall risk and increase community access.     Baseline  Pt has obtained Rollator for use outside    Time  2    Period  Weeks    Status  Achieved        PT Long Term Goals - 01/20/18 1118      PT LONG TERM GOAL #1   Title  Patient will be independent in home exercise program to improve strength/mobility for better functional independence with ADLs.    Baseline  Pt does part of her HEP about 3-4 times per week.    Time  4    Period  Weeks    Status  Partially Met    Target Date  02/17/18      PT LONG TERM GOAL #2   Title  Patient  will increase Berg Balance score by > 6 points to demonstrate decreased fall risk during functional activities.; Deferred as patient's balance is  more impaired dynamically and therefore FGA goal more appropriate;     Baseline  will defer at this time;     Time  4    Period  Weeks    Status  Deferred    Target Date  02/17/18      PT LONG TERM GOAL #3   Title  Patient (> 48 years old) will complete five times sit to stand test in < 15 seconds indicating an increased LE strength and improved balance.    Baseline  15 sec without HHA    Time  4    Period  Weeks    Status  Achieved    Target Date  02/17/18      PT LONG TERM GOAL #4   Title  Patient will ascend/descend 4 stairs without rail assist independently without loss of balance to improve ability to get in/out of home.     Baseline  able to negotiate reciprocally but requires rail assist;     Time  4    Period  Weeks    Status  Partially Met    Target Date  02/17/18      PT LONG TERM GOAL #5   Title  Patient will increase BLE gross strength to 4/5 as to improve functional strength for independent gait, increased standing tolerance and increased ADL ability.    Baseline  ankle 3+/5    Time  4    Period  Weeks    Status  Partially Met    Target Date  02/17/18      PT LONG TERM GOAL #6   Title  Patient will increase Functional Gait Assessment score to >20/30 as to reduce fall risk and improve dynamic gait safety with community ambulation.    Baseline  20/30    Time  4    Period  Weeks    Status  Partially Met    Target Date  02/17/18            Plan - 02/11/18 1518    Clinical Impression Statement  Greater focus on balance today with challange using noodle for UE support. RLE weakness noted with extension exercise today more so than R, which may have contribulted to decreased balance. Pt does require cues to increase focus on task at hand, which does improve balance and form. It is also noted that when pt begins to lose  balance, pt tucks hips with and anterior shift of pelvis further adding to retropulsive lean.     Rehab Potential  Fair    Clinical Impairments Affecting Rehab Potential  Pt does not use AD and had decreased knowledge of her limitations    PT Frequency  2x / week    PT Duration  4 weeks    PT Treatment/Interventions  ADLs/Self Care Home Management;Aquatic Therapy;Biofeedback;Canalith Repostioning;Cryotherapy;Moist Heat;Cognitive remediation;Neuromuscular re-education;Balance training;Therapeutic exercise;Therapeutic activities;Functional mobility training;Stair training;Gait training;DME Instruction;Patient/family education;Orthotic Fit/Training;Manual techniques;Passive range of motion;Visual/perceptual remediation/compensation;Vestibular;Energy conservation;Dry needling    PT Home Exercise Plan  continue as given;     Consulted and Agree with Plan of Care  Patient       Patient will benefit from skilled therapeutic intervention in order to improve the following deficits and impairments:  Abnormal gait, Decreased activity tolerance, Decreased balance, Decreased cognition, Decreased coordination, Decreased safety awareness, Decreased mobility, Decreased knowledge of use of DME, Decreased knowledge of precautions, Decreased endurance, Decreased skin integrity, Decreased strength, Difficulty walking, Dizziness, Postural dysfunction, Improper body mechanics, Impaired vision/preception, Impaired UE functional use,  Impaired sensation, Hypomobility, Decreased range of motion, Impaired flexibility, Pain  Visit Diagnosis: Other lack of coordination  Other abnormalities of gait and mobility  Muscle weakness (generalized)     Problem List Patient Active Problem List   Diagnosis Date Noted  . Ataxic gait 11/20/2017  . Gait instability 11/20/2017  . 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 Cognitive decline 11/20/2017  . PBA (pseudobulbar affect) 11/20/2017  . Truncal ataxia 11/20/2017  . Lumbar  facet joint syndrome (Primary Area of Pain) (Bilateral) (L>R) 07/29/2017  . Chronic sacroiliac joint pain (Bilateral) (L>R) 07/29/2017  . Chronic hip pain (Bilateral) (L>R) 07/29/2017  . Chronic lower extremity pain (Secondary Area of Pain) (Bilateral) (L>R) 07/29/2017  . Impairment of balance 07/07/2017  . Menopausal and female climacteric states 06/15/2017  . Lumbar foraminal stenosis (T12-S1, multilevel) 06/10/2017  . Lumbar lateral recess stenosis (left L2-3, bilateral L3-4, & right L4-5) 06/10/2017  . Grade 1 Anterolisthesis of L4 over L5 and L5 over S1 06/10/2017  . Lumbar facet hypertrophy (multilevel) 06/10/2017  . Lumbar Ligamentum flavum hypertrophy (HCC) (multilevel) 06/10/2017  . Dropfoot (Left) (L5 radiculopathy) 06/10/2017  . Neurogenic pain 06/09/2017  . DDD (degenerative disc disease), lumbar 06/09/2017  . 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 Closed fracture of metatarsal bone 05/06/2017  . 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 Abnormal MRI, cervical spine 05/06/2017  . 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[place]) 05/06/2017  . Cervical radiculitis 05/06/2017  . Chronic lumbar radiculopathy (Bilateral) (L>R) (left L5) 05/06/2017  . Chronic low back pain (Primary Area of Pain) (Bilateral) (L>R) 05/06/2017  . Abnormal MRI, lumbar spine 05/06/2017  . Chronic pain syndrome 03/19/2017  . Chronic shoulder pain (Fourth Area of Pain) (Bilateral) (L>R) 03/19/2017  . Osteoarthritis of shoulder (Bilateral) 03/19/2017  . Chronic neck pain [facility] Area of Pain) (Bilateral) (L>R) 03/19/2017  . History of C3-5 ACDF 03/19/2017  . Numbness and tingling in left upper extremity 03/19/2017  . Numbness and tingling of right upper extremity 03/19/2017  . Upper extremity weakness (Bilateral) (L>R) 03/19/2017  . Numbness of upper extremity (Bilateral) (L>R) 03/19/2017  . Numbness and  tingling of lower extremity (Bilateral) (L>R) 03/19/2017  . Weakness of lower extremity (Bilateral) (R>L) 03/19/2017  . Chronic abdominal pain (epigastric) 03/19/2017  . Pituitary microadenoma ([place]) 12/08/2016  . History of breast cancer in adulthood 12/08/2016  . Dysmetria 12/08/2016  . Loss of weight 08/15/2015  . Colon polyp 05/25/2015  . Personal history of malignant neoplasm of breast 07/18/2014  . Breast cancer ([place]) 06/28/2014  . Bipolar disorder ([place]) 06/28/2014  . Chronic pancreatitis ([place]) 06/28/2014  . HTN (hypertension) 06/28/2014  . OA (osteoarthritis) 06/28/2014  . Renal insufficiency 06/28/2014  . DDD (degenerative disc disease), cervical 06/28/2014  . Hyperlipidemia 06/28/2014  . COPD (chronic obstructive pulmonary disease) ([place]) 05/17/2014  . Breast cancer of upper-outer quadrant of right female breast [facility]) 05/31/2008    Larae Grooms 02/11/2018, 4:43 PM  [place] MAIN [facility] SERVICES [address] [place], [place], [address] Phone: [phone]   Fax:  [phone]  Name: ALBERTINA LEISE MRN: [id number] Date of Birth: [birth year]

## 2018-02-16 ENCOUNTER — Other Ambulatory Visit: Payer: Self-pay

## 2018-02-16 ENCOUNTER — Ambulatory Visit: Payer: Medicare Other

## 2018-02-16 DIAGNOSIS — R2689 Other abnormalities of gait and mobility: Secondary | ICD-10-CM

## 2018-02-16 DIAGNOSIS — R278 Other lack of coordination: Secondary | ICD-10-CM

## 2018-02-16 DIAGNOSIS — M6281 Muscle weakness (generalized): Secondary | ICD-10-CM

## 2018-02-16 NOTE — Therapy (Signed)
[place] [facility] SERVICES [address] [place], [place], [address] Phone: [phone]   Fax:  [phone]  Physical Therapy Treatment  Patient Details  Name: Elaine Clayton MRN: [id number] Date of Birth: 12-Mar-1946 Referring Provider: Dr. Joanna Hews    Encounter Date: 02/16/2018  PT End of Session - 02/16/18 1408    Visit Number  28    Number of Visits  49    Date for PT Re-Evaluation  02/17/18    PT Start Time  1020    PT Stop Time  1120    PT Time Calculation (min)  60 min    Behavior During Therapy  [facility] for tasks assessed/performed       Past Medical History:  Diagnosis Date  . Anemia   . Bipolar affective disorder ([place])    2  . Broken foot 2015   right foot  . Chronic gastritis   . Chronic kidney disease    Dr Holley Raring  . COPD (chronic obstructive pulmonary disease) ([place])   . Dizziness    inner ear (per pt) several times per week  . Dysphagia   . Glaucoma   . Hyperlipemia   . Hypertension    pt has recently come off BP meds.  MD aware. BP seems stable.  . Lithium toxicity 2015  . 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 Neck stiffness    s/p C3-C4 fusion, limited right turn  . Osteoarthritis    back  . Pancreatitis   . Personal history of malignant neoplasm of breast   . Pituitary microadenoma ([place])   . Recurrent UTI   . Renal insufficiency   . Stomach ulcer 2112    Past Surgical History:  Procedure Laterality Date  . APPENDECTOMY    . BREAST BIOPSY Right 2011   neg  . BREAST EXCISIONAL BIOPSY Right 2001   neg  . BREAST EXCISIONAL BIOPSY Right 2008   breast ca 2008 radiation  . BREAST EXCISIONAL BIOPSY Left 10/26/2012   neg  . BREAST SURGERY Left  2013   left breast wide excision,Intraductal papilloma, ductal hyperplasia and sclerosing adenosis. Microcalcifications associated with columnar cell change. No evidence of atypia  or malignancy. Margins are unremarkable.  Marland Kitchen BREAST SURGERY Right November 06, 1999   multiple areas of microcalcification showing evidence of sclerosing adenosis and ductal hyperplasia.  Marland Kitchen BREAST SURGERY Left May 07, 2007   wide excision.  . cataract Right 2013  . CATARACT EXTRACTION W/PHACO Left 03/05/2016   Procedure: CATARACT EXTRACTION PHACO AND INTRAOCULAR LENS PLACEMENT (IOC);  Surgeon: Leandrew Koyanagi, MD;  Location: [place];  Service: Ophthalmology;  Laterality: Left;  . COLONOSCOPY  2004  . COLONOSCOPY N/A 05/14/2015   Procedure: COLONOSCOPY;  Surgeon: Manya Silvas, MD;  Location: [facility] ENDOSCOPY;  Service: Endoscopy;  Laterality: N/A;  . ERCP N/A   . ESOPHAGOGASTRODUODENOSCOPY N/A 05/14/2015   Procedure: ESOPHAGOGASTRODUODENOSCOPY (EGD);  Surgeon: Manya Silvas, MD;  Location: [facility] ENDOSCOPY;  Service: Endoscopy;  Laterality: N/A;  . EUS N/A 2011, 2012  . EYE SURGERY  2013  . GLAUCOMA SURGERY Right 2013  . LAPAROSCOPIC RIGHT COLECTOMY Right 06/22/2015   Procedure: LAPAROSCOPIC RIGHT COLECTOMY;  Surgeon: Robert Bellow, MD;  Location: [facility] ORS;  Service: General;  Laterality: Right;  . NECK SURGERY  2006  . right breast cancer    . SAVORY DILATION N/A 05/14/2015  Procedure: SAVORY DILATION;  Surgeon: Manya Silvas, MD;  Location: [facility] ENDOSCOPY;  Service: Endoscopy;  Laterality: N/A;  . TRABECULECTOMY Left 03/05/2016   Procedure: TRABECULECTOMY WITH [facility] AND [facility] SHUNT;  Surgeon: Leandrew Koyanagi, MD;  Location: [place];  Service: Ophthalmology;  Laterality: Left;  . TUBAL LIGATION      There were no vitals filed for this visit.  Subjective Assessment - 02/16/18 1405    Subjective  Pt reports so far this week has been better regarding balance. Pain noted to be mildly better in low back. Feels legs are getting stronger overall, but continues weaker on the L.     Pertinent History  72 yo Female with recent increase in back pain with  subsequent numbness/tingling , weakness, and drop foot in B LEs, L>R starting about 3 months ago. She has had increased instability in gait and falls recently with minor injuries. Pt had recent epidural steroid injection L4-5, and L5-S1 with decrease in back pain since.Pt with extensive surgical and medical Hx including breast cancer (last surgery 2013), DJD with cervical fusion C3-5 in 2006, bipolar disorder, HTN, OA in bilateral shoulders L>R, hip and SIJ pain x >20 years.       Enters/exits pool via ramp Participates in the following  Ambulation with KB Fwd 4 L Side 4 L Side with squat 4 L  Balance with noodle  high knee march, 4 L   Bench B hip ext with noodle for resist, 30x each STS without UE support, 30x   Lunges, focus on shifting weight to lunge leg and pulling back with opp leg to start position, with core strength  Side, B, 30x each  Fwd, B, 30x each  Rail LE strength  Sumo squat, 30x                          PT Education - 02/16/18 1406    Education provided  Yes    Education Details  Pt is taking pain medication on a scheduled basis, but notes waiting until pain is severe (about a 10 hour span vs 6 she can take it in) Discussed splitting the difference and taking 8 hours apart to avoid letting pain get severe.        PT Short Term Goals - 01/20/18 1118      PT SHORT TERM GOAL #1   Title  Patient will deny any falls over past 4 weeks to demonstrate improved safety awareness at home and work.     Baseline  has multiple near misses; fell in [employer] about 72 years ago;     Time  4    Period  Weeks    Status  Not Met    Target Date  02/17/18      PT SHORT TERM GOAL #2   Title  Pt will obtain and appropriately use LRAD (Rollator suggested) in order to decraese fall risk and increase community access.     Baseline  Pt has obtained Rollator for use outside    Time  2    Period  Weeks    Status  Achieved        PT Long Term Goals -  01/20/18 1118      PT LONG TERM GOAL #1   Title  Patient will be independent in home exercise program to improve strength/mobility for better functional independence with ADLs.    Baseline  Pt does part of her HEP about  3-4 times per week.    Time  4    Period  Weeks    Status  Partially Met    Target Date  02/17/18      PT LONG TERM GOAL #2   Title  Patient will increase Berg Balance score by > 6 points to demonstrate decreased fall risk during functional activities.; Deferred as patient's balance is more impaired dynamically and therefore FGA goal more appropriate;     Baseline  will defer at this time;     Time  4    Period  Weeks    Status  Deferred    Target Date  02/17/18      PT LONG TERM GOAL #3   Title  Patient (> 70 years old) will complete five times sit to stand test in < 15 seconds indicating an increased LE strength and improved balance.    Baseline  15 sec without HHA    Time  4    Period  Weeks    Status  Achieved    Target Date  02/17/18      PT LONG TERM GOAL #4   Title  Patient will ascend/descend 4 stairs without rail assist independently without loss of balance to improve ability to get in/out of home.     Baseline  able to negotiate reciprocally but requires rail assist;     Time  4    Period  Weeks    Status  Partially Met    Target Date  02/17/18      PT LONG TERM GOAL #5   Title  Patient will increase BLE gross strength to 4/5 as to improve functional strength for independent gait, increased standing tolerance and increased ADL ability.    Baseline  ankle 3+/5    Time  4    Period  Weeks    Status  Partially Met    Target Date  02/17/18      PT LONG TERM GOAL #6   Title  Patient will increase Functional Gait Assessment score to >20/30 as to reduce fall risk and improve dynamic gait safety with community ambulation.    Baseline  20/30    Time  4    Period  Weeks    Status  Partially Met    Target Date  02/17/18            Plan -  02/16/18 1408    Clinical Impression Statement  Much improved balance today throughout session. Focus on LE and core exercises post balance work. Cues required throughout for form and to use light UE touch during some exercises as balance is not primary focus and wish to keep proper form for core and LE. Plan to have pool session Thrusday Feb 28th followed by reassessment on land. Discussed beginning weaning pt from aquatic and starting to work independently in pool with HEP. Will discuss detail with primary therapist.     Rehab Potential  Fair    Clinical Impairments Affecting Rehab Potential  Pt does not use AD and had decreased knowledge of her limitations    PT Frequency  2x / week    PT Duration  4 weeks    PT Treatment/Interventions  ADLs/Self Care Home Management;Aquatic Therapy;Biofeedback;Canalith Repostioning;Cryotherapy;Moist Heat;Cognitive remediation;Neuromuscular re-education;Balance training;Therapeutic exercise;Therapeutic activities;Functional mobility training;Stair training;Gait training;DME Instruction;Patient/family education;Orthotic Fit/Training;Manual techniques;Passive range of motion;Visual/perceptual remediation/compensation;Vestibular;Energy conservation;Dry needling    PT Home Exercise Plan  continue as given;     Consulted and Agree with Plan  of Care  Patient       Patient will benefit from skilled therapeutic intervention in order to improve the following deficits and impairments:  Abnormal gait, Decreased activity tolerance, Decreased balance, Decreased cognition, Decreased coordination, Decreased safety awareness, Decreased mobility, Decreased knowledge of use of DME, Decreased knowledge of precautions, Decreased endurance, Decreased skin integrity, Decreased strength, Difficulty walking, Dizziness, Postural dysfunction, Improper body mechanics, Impaired vision/preception, Impaired UE functional use, Impaired sensation, Hypomobility, Decreased range of motion, Impaired  flexibility, Pain  Visit Diagnosis: Other lack of coordination  Other abnormalities of gait and mobility  Muscle weakness (generalized)     Problem List Patient Active Problem List   Diagnosis Date Noted  . Ataxic gait 11/20/2017  . Gait instability 11/20/2017  . 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 Cognitive decline 11/20/2017  . PBA (pseudobulbar affect) 11/20/2017  . Truncal ataxia 11/20/2017  . Lumbar facet joint syndrome (Primary Area of Pain) (Bilateral) (L>R) 07/29/2017  . Chronic sacroiliac joint pain (Bilateral) (L>R) 07/29/2017  . Chronic hip pain (Bilateral) (L>R) 07/29/2017  . Chronic lower extremity pain (Secondary Area of Pain) (Bilateral) (L>R) 07/29/2017  . Impairment of balance 07/07/2017  . Menopausal and female climacteric states 06/15/2017  . Lumbar foraminal stenosis (T12-S1, multilevel) 06/10/2017  . Lumbar lateral recess stenosis (left L2-3, bilateral L3-4, & right L4-5) 06/10/2017  . Grade 1 Anterolisthesis of L4 over L5 and L5 over S1 06/10/2017  . Lumbar facet hypertrophy (multilevel) 06/10/2017  . Lumbar Ligamentum flavum hypertrophy (HCC) (multilevel) 06/10/2017  . Dropfoot (Left) (L5 radiculopathy) 06/10/2017  . Neurogenic pain 06/09/2017  . DDD (degenerative disc disease), lumbar 06/09/2017  . 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 Closed fracture of metatarsal bone 05/06/2017  . 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 Abnormal MRI, cervical spine 05/06/2017  . 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[place]) 05/06/2017  . Cervical radiculitis 05/06/2017  . Chronic lumbar radiculopathy (Bilateral) (L>R) (left L5) 05/06/2017  . Chronic low back pain (Primary Area of Pain) (Bilateral) (L>R) 05/06/2017  . Abnormal MRI, lumbar spine 05/06/2017  . Chronic pain syndrome 03/19/2017  . Chronic shoulder pain (Fourth Area of Pain) (Bilateral) (L>R)  03/19/2017  . Osteoarthritis of shoulder (Bilateral) 03/19/2017  . Chronic neck pain [facility] Area of Pain) (Bilateral) (L>R) 03/19/2017  . History of C3-5 ACDF 03/19/2017  . Numbness and tingling in left upper extremity 03/19/2017  . Numbness and tingling of right upper extremity 03/19/2017  . Upper extremity weakness (Bilateral) (L>R) 03/19/2017  . Numbness of upper extremity (Bilateral) (L>R) 03/19/2017  . Numbness and tingling of lower extremity (Bilateral) (L>R) 03/19/2017  . Weakness of lower extremity (Bilateral) (R>L) 03/19/2017  . Chronic abdominal pain (epigastric) 03/19/2017  . Pituitary microadenoma ([place]) 12/08/2016  . History of breast cancer in adulthood 12/08/2016  . Dysmetria 12/08/2016  . Loss of weight 08/15/2015  . Colon polyp 05/25/2015  . Personal history of malignant neoplasm of breast 07/18/2014  . Breast cancer ([place]) 06/28/2014  . Bipolar disorder ([place]) 06/28/2014  . Chronic pancreatitis ([place]) 06/28/2014  . HTN (hypertension) 06/28/2014  . OA (osteoarthritis) 06/28/2014  . Renal insufficiency 06/28/2014  . DDD (degenerative disc disease), cervical 06/28/2014  . Hyperlipidemia 06/28/2014  . COPD (chronic obstructive pulmonary disease) ([place]) 05/17/2014  . Breast cancer of upper-outer quadrant of right female breast ([place]) 05/31/2008    Larae Grooms 02/16/2018, 2:13 PM  [place] [facility] [address]  [place], [place], [address] Phone: [phone]   Fax:  [phone]  Name: MINERVA BLUETT MRN: [id number] Date of Birth: 04/06/1946

## 2018-02-18 ENCOUNTER — Ambulatory Visit: Payer: Medicare Other

## 2018-02-18 ENCOUNTER — Other Ambulatory Visit: Payer: Self-pay

## 2018-02-18 DIAGNOSIS — R278 Other lack of coordination: Secondary | ICD-10-CM

## 2018-02-18 DIAGNOSIS — M6281 Muscle weakness (generalized): Secondary | ICD-10-CM

## 2018-02-18 DIAGNOSIS — R2689 Other abnormalities of gait and mobility: Secondary | ICD-10-CM

## 2018-02-18 NOTE — Therapy (Signed)
[place] MAIN [facility] SERVICES [address] [place], [place], [address] Phone: [phone]   Fax:  [phone]  Physical Therapy Treatment  Patient Details  Name: Elaine Clayton MRN: [id number] Date of Birth: 1946/04/02 Referring Provider: Dr. Joanna Hews    Encounter Date: 02/18/2018  PT End of Session - 02/18/18 1427    Visit Number  29    Number of Visits  49    Date for PT Re-Evaluation  02/17/18    PT Start Time  1125    PT Stop Time  1225    PT Time Calculation (min)  60 min    Activity Tolerance  Patient tolerated treatment well;No increased pain    Behavior During Therapy  WFL for tasks assessed/performed       Past Medical History:  Diagnosis Date  . Anemia   . Bipolar affective disorder ([place])    2  . Broken foot 2015   right foot  . Chronic gastritis   . Chronic kidney disease    Dr Holley Raring  . COPD (chronic obstructive pulmonary disease) ([place])   . Dizziness    inner ear (per pt) several times per week  . Dysphagia   . Glaucoma   . Hyperlipemia   . Hypertension    pt has recently come off BP meds.  MD aware. BP seems stable.  . Lithium toxicity 2015  . 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 Neck stiffness    s/p C3-C4 fusion, limited right turn  . Osteoarthritis    back  . Pancreatitis   . Personal history of malignant neoplasm of breast   . Pituitary microadenoma ([place])   . Recurrent UTI   . Renal insufficiency   . Stomach ulcer 2112    Past Surgical History:  Procedure Laterality Date  . APPENDECTOMY    . BREAST BIOPSY Right 2011   neg  . BREAST EXCISIONAL BIOPSY Right 2001   neg  . BREAST EXCISIONAL BIOPSY Right 2008   breast ca 2008 radiation  . BREAST EXCISIONAL BIOPSY Left 10/26/2012   neg  . BREAST SURGERY Left  2013   left breast wide excision,Intraductal papilloma, ductal hyperplasia and sclerosing adenosis.  Microcalcifications associated with columnar cell change. No evidence of atypia or malignancy. Margins are unremarkable.  [facility] BREAST SURGERY Right November 06, 1999   multiple areas of microcalcification showing evidence of sclerosing adenosis and ductal hyperplasia.  [facility] Kitchen BREAST SURGERY Left May 07, 2007   wide excision.  . cataract Right 2013  . CATARACT EXTRACTION W/PHACO Left 03/05/2016   Procedure: CATARACT EXTRACTION PHACO AND INTRAOCULAR LENS PLACEMENT (IOC);  Surgeon: Leandrew Koyanagi, MD;  Location: [place];  Service: Ophthalmology;  Laterality: Left;  . COLONOSCOPY  2004  . COLONOSCOPY N/A 05/14/2015   Procedure: COLONOSCOPY;  Surgeon: Manya Silvas, MD;  Location: [facility] ENDOSCOPY;  Service: Endoscopy;  Laterality: N/A;  . ERCP N/A   . ESOPHAGOGASTRODUODENOSCOPY N/A 05/14/2015   Procedure: ESOPHAGOGASTRODUODENOSCOPY (EGD);  Surgeon: Manya Silvas, MD;  Location: [facility] ENDOSCOPY;  Service: Endoscopy;  Laterality: N/A;  . EUS N/A 2011, 2012  . EYE SURGERY  2013  . GLAUCOMA SURGERY Right 2013  . LAPAROSCOPIC RIGHT COLECTOMY Right 06/22/2015   Procedure: LAPAROSCOPIC RIGHT COLECTOMY;  Surgeon: Robert Bellow, MD;  Location: [facility] ORS;  Service: General;  Laterality: Right;  . NECK SURGERY  2006  . right  breast cancer    . SAVORY DILATION N/A 05/14/2015   Procedure: SAVORY DILATION;  Surgeon: Manya Silvas, MD;  Location: [facility] ENDOSCOPY;  Service: Endoscopy;  Laterality: N/A;  . TRABECULECTOMY Left 03/05/2016   Procedure: TRABECULECTOMY WITH [facility] AND [facility];  Surgeon: Leandrew Koyanagi, MD;  Location: [place];  Service: Ophthalmology;  Laterality: Left;  . TUBAL LIGATION      There were no vitals filed for this visit.  Subjective Assessment - 02/18/18 1426    Subjective  Pt reports fall this morning without significant injury. Landed on B knees; denies pain currently, but notes she was having a worse day upon waking this morn.     Pertinent History   72 yo Female with recent increase in back pain with subsequent numbness/tingling , weakness, and drop foot in B LEs, L>R starting about 3 months ago. She has had increased instability in gait and falls recently with minor injuries. Pt had recent epidural steroid injection L4-5, and L5-S1 with decrease in back pain since.Pt with extensive surgical and medical Hx including breast cancer (last surgery 2013), DJD with cervical fusion C3-5 in 2006, bipolar disorder, HTN, OA in bilateral shoulders L>R, hip and SIJ pain x >20 years.        Enters/exits via ramp Participates in the following  Ambulation, blue dumbbells for support 4 L fwd and side  Core strength with UE strengthening, shoulder submerged, 20x each Red dumbbells  sh flex/ext  sh abd/add  sh horiz abd/add  triceps press downs  Bench  single hip ext with noodle, 30x each  Rail LE strength  sumo squat, 30x  squat 30x  Step   single leg step up to eccentric lower; R 25x, L 20x                           PT Short Term Goals - 01/20/18 1118      PT SHORT TERM GOAL #1   Title  Patient will deny any falls over past 4 weeks to demonstrate improved safety awareness at home and work.     Baseline  has multiple near misses; fell in [employer] about a week ago;     Time  4    Period  Weeks    Status  Not Met    Target Date  02/17/18      PT SHORT TERM GOAL #2   Title  Pt will obtain and appropriately use LRAD (Rollator suggested) in order to decraese fall risk and increase community access.     Baseline  Pt has obtained Rollator for use outside    Time  2    Period  Weeks    Status  Achieved        PT Long Term Goals - 01/20/18 1118      PT LONG TERM GOAL #1   Title  Patient will be independent in home exercise program to improve strength/mobility for better functional independence with ADLs.    Baseline  Pt does part of her HEP about 3-4 times per week.    Time  4    Period  Weeks    Status   Partially Met    Target Date  02/17/18      PT LONG TERM GOAL #2   Title  Patient will increase Berg Balance score by > 6 points to demonstrate decreased fall risk during functional activities.; Deferred as patient's balance is more impaired  dynamically and therefore FGA goal more appropriate;     Baseline  will defer at this time;     Time  4    Period  Weeks    Status  Deferred    Target Date  02/17/18      PT LONG TERM GOAL #3   Title  Patient (> 10 years old) will complete five times sit to stand test in < 15 seconds indicating an increased LE strength and improved balance.    Baseline  15 sec without HHA    Time  4    Period  Weeks    Status  Achieved    Target Date  02/17/18      PT LONG TERM GOAL #4   Title  Patient will ascend/descend 4 stairs without rail assist independently without loss of balance to improve ability to get in/out of home.     Baseline  able to negotiate reciprocally but requires rail assist;     Time  4    Period  Weeks    Status  Partially Met    Target Date  02/17/18      PT LONG TERM GOAL #5   Title  Patient will increase BLE gross strength to 4/5 as to improve functional strength for independent gait, increased standing tolerance and increased ADL ability.    Baseline  ankle 3+/5    Time  4    Period  Weeks    Status  Partially Met    Target Date  02/17/18      PT LONG TERM GOAL #6   Title  Patient will increase Functional Gait Assessment score to >20/30 as to reduce fall risk and improve dynamic gait safety with community ambulation.    Baseline  20/30    Time  4    Period  Weeks    Status  Partially Met    Target Date  02/17/18            Plan - 02/18/18 1428    Clinical Impression Statement  Pt tolerated session well today demonstrating good balance and strength. Continues to require verbal cues throughout for posture/form. Plan to decrease to 1x/week and begin working on HEP in preparation for independence with programs.      Rehab Potential  Fair    Clinical Impairments Affecting Rehab Potential  Pt does not use AD and had decreased knowledge of her limitations    PT Frequency  2x / week    PT Duration  4 weeks    PT Treatment/Interventions  ADLs/Self Care Home Management;Aquatic Therapy;Biofeedback;Canalith Repostioning;Cryotherapy;Moist Heat;Cognitive remediation;Neuromuscular re-education;Balance training;Therapeutic exercise;Therapeutic activities;Functional mobility training;Stair training;Gait training;DME Instruction;Patient/family education;Orthotic Fit/Training;Manual techniques;Passive range of motion;Visual/perceptual remediation/compensation;Vestibular;Energy conservation;Dry needling    PT Home Exercise Plan  continue as given;     Consulted and Agree with Plan of Care  Patient       Patient will benefit from skilled therapeutic intervention in order to improve the following deficits and impairments:  Abnormal gait, Decreased activity tolerance, Decreased balance, Decreased cognition, Decreased coordination, Decreased safety awareness, Decreased mobility, Decreased knowledge of use of DME, Decreased knowledge of precautions, Decreased endurance, Decreased skin integrity, Decreased strength, Difficulty walking, Dizziness, Postural dysfunction, Improper body mechanics, Impaired vision/preception, Impaired UE functional use, Impaired sensation, Hypomobility, Decreased range of motion, Impaired flexibility, Pain  Visit Diagnosis: Other lack of coordination  Other abnormalities of gait and mobility  Muscle weakness (generalized)     Problem List Patient Active Problem List  Diagnosis Date Noted  . Ataxic gait 11/20/2017  . Gait instability 11/20/2017  . 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 Cognitive decline 11/20/2017  . PBA (pseudobulbar affect) 11/20/2017  . Truncal ataxia 11/20/2017  . Lumbar facet joint syndrome (Primary Area of Pain) (Bilateral) (L>R) 07/29/2017  . Chronic sacroiliac  joint pain (Bilateral) (L>R) 07/29/2017  . Chronic hip pain (Bilateral) (L>R) 07/29/2017  . Chronic lower extremity pain (Secondary Area of Pain) (Bilateral) (L>R) 07/29/2017  . Impairment of balance 07/07/2017  . Menopausal and female climacteric states 06/15/2017  . Lumbar foraminal stenosis (T12-S1, multilevel) 06/10/2017  . Lumbar lateral recess stenosis (left L2-3, bilateral L3-4, & right L4-5) 06/10/2017  . Grade 1 Anterolisthesis of L4 over L5 and L5 over S1 06/10/2017  . Lumbar facet hypertrophy (multilevel) 06/10/2017  . Lumbar Ligamentum flavum hypertrophy (HCC) (multilevel) 06/10/2017  . Dropfoot (Left) (L5 radiculopathy) 06/10/2017  . Neurogenic pain 06/09/2017  . DDD (degenerative disc disease), lumbar 06/09/2017  . 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 Closed fracture of metatarsal bone 05/06/2017  . 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 Abnormal MRI, cervical spine 05/06/2017  . 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[place]) 05/06/2017  . Cervical radiculitis 05/06/2017  . Chronic lumbar radiculopathy (Bilateral) (L>R) (left L5) 05/06/2017  . Chronic low back pain (Primary Area of Pain) (Bilateral) (L>R) 05/06/2017  . Abnormal MRI, lumbar spine 05/06/2017  . Chronic pain syndrome 03/19/2017  . Chronic shoulder pain (Fourth Area of Pain) (Bilateral) (L>R) 03/19/2017  . Osteoarthritis of shoulder (Bilateral) 03/19/2017  . Chronic neck pain [facility] Area of Pain) (Bilateral) (L>R) 03/19/2017  . History of C3-5 ACDF 03/19/2017  . Numbness and tingling in left upper extremity 03/19/2017  . Numbness and tingling of right upper extremity 03/19/2017  . Upper extremity weakness (Bilateral) (L>R) 03/19/2017  . Numbness of upper extremity (Bilateral) (L>R) 03/19/2017  . Numbness and tingling of lower extremity (Bilateral) (L>R) 03/19/2017  . Weakness of lower extremity  (Bilateral) (R>L) 03/19/2017  . Chronic abdominal pain (epigastric) 03/19/2017  . Pituitary microadenoma ([place]) 12/08/2016  . History of breast cancer in adulthood 12/08/2016  . Dysmetria 12/08/2016  . Loss of weight 08/15/2015  . Colon polyp 05/25/2015  . Personal history of malignant neoplasm of breast 07/18/2014  . Breast cancer ([place]) 06/28/2014  . Bipolar disorder ([place]) 06/28/2014  . Chronic pancreatitis ([place]) 06/28/2014  . HTN (hypertension) 06/28/2014  . OA (osteoarthritis) 06/28/2014  . Renal insufficiency 06/28/2014  . DDD (degenerative disc disease), cervical 06/28/2014  . Hyperlipidemia 06/28/2014  . COPD (chronic obstructive pulmonary disease) ([place]) 05/17/2014  . Breast cancer of upper-outer quadrant of right female breast ([place]) 05/31/2008    Larae Grooms 02/18/2018, 2:31 PM  [employer] MAIN [facility] SERVICES [address] [place], [place], [address] Phone: [phone]   Fax:  [phone]  Name: Elaine Clayton MRN: [id number] Date of Birth: 08-Apr-1946

## 2018-02-18 NOTE — Therapy (Signed)
[place] MAIN [facility] SERVICES [address] [place], [place], [address] Phone: [phone]   Fax:  [phone]  Patient Details  Name: Elaine Clayton MRN: [id number] Date of Birth: Apr 29, 1946 Referring Provider:  Idelle Crouch, MD  Encounter Date: 02/18/2018   Larae Grooms 02/18/2018, 2:31 PM  [place] MAIN [place] [address] [place], [place], [address] Phone: [phone]   Fax:  [phone]

## 2018-02-22 ENCOUNTER — Ambulatory Visit: Payer: Medicare Other | Attending: Pain Medicine | Admitting: Physical Therapy

## 2018-02-22 ENCOUNTER — Encounter: Payer: Self-pay | Admitting: Physical Therapy

## 2018-02-22 VITALS — BP 132/63 | HR 85

## 2018-02-22 DIAGNOSIS — R278 Other lack of coordination: Secondary | ICD-10-CM | POA: Insufficient documentation

## 2018-02-22 DIAGNOSIS — M6281 Muscle weakness (generalized): Secondary | ICD-10-CM | POA: Insufficient documentation

## 2018-02-22 DIAGNOSIS — R2689 Other abnormalities of gait and mobility: Secondary | ICD-10-CM | POA: Diagnosis present

## 2018-02-22 NOTE — Therapy (Signed)
[place] MAIN [facility] SERVICES [address] [place], [place], [address] Phone: [phone]   Fax:  [phone]  Physical Therapy Treatment  Patient Details  Name: Elaine Clayton MRN: [id number] Date of Birth: 04-26-1946 Referring Provider: Dr. Joanna Hews    Encounter Date: 02/22/2018  PT End of Session - 02/22/18 1300    Visit Number  30    Number of Visits  55    Date for PT Re-Evaluation  03/31/18    PT Start Time  1300    PT Stop Time  1346    PT Time Calculation (min)  46 min    Activity Tolerance  Patient tolerated treatment well;No increased pain    Behavior During Therapy  WFL for tasks assessed/performed       Past Medical History:  Diagnosis Date  . Anemia   . Bipolar affective disorder ([place])    2  . Broken foot 2015   right foot  . Chronic gastritis   . Chronic kidney disease    Dr Holley Raring  . COPD (chronic obstructive pulmonary disease) ([place])   . Dizziness    inner ear (per pt) several times per week  . Dysphagia   . Glaucoma   . Hyperlipemia   . Hypertension    pt has recently come off BP meds.  MD aware. BP seems stable.  . Lithium toxicity 2015  . 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 Neck stiffness    s/p C3-C4 fusion, limited right turn  . Osteoarthritis    back  . Pancreatitis   . Personal history of malignant neoplasm of breast   . Pituitary microadenoma ([place])   . Recurrent UTI   . Renal insufficiency   . Stomach ulcer 2112    Past Surgical History:  Procedure Laterality Date  . APPENDECTOMY    . BREAST BIOPSY Right 2011   neg  . BREAST EXCISIONAL BIOPSY Right 2001   neg  . BREAST EXCISIONAL BIOPSY Right 2008   breast ca 2008 radiation  . BREAST EXCISIONAL BIOPSY Left 10/26/2012   neg  . BREAST SURGERY Left  2013   left breast wide excision,Intraductal papilloma, ductal hyperplasia and sclerosing adenosis.  Microcalcifications associated with columnar cell change. No evidence of atypia or malignancy. Margins are unremarkable.  Marland Kitchen BREAST SURGERY Right November 06, 1999   multiple areas of microcalcification showing evidence of sclerosing adenosis and ductal hyperplasia.  Marland Kitchen BREAST SURGERY Left May 07, 2007   wide excision.  . cataract Right 2013  . CATARACT EXTRACTION W/PHACO Left 03/05/2016   Procedure: CATARACT EXTRACTION PHACO AND INTRAOCULAR LENS PLACEMENT (IOC);  Surgeon: Leandrew Koyanagi, MD;  Location: [place];  Service: Ophthalmology;  Laterality: Left;  . COLONOSCOPY  2004  . COLONOSCOPY N/A 05/14/2015   Procedure: COLONOSCOPY;  Surgeon: Manya Silvas, MD;  Location: [facility] ENDOSCOPY;  Service: Endoscopy;  Laterality: N/A;  . ERCP N/A   . ESOPHAGOGASTRODUODENOSCOPY N/A 05/14/2015   Procedure: ESOPHAGOGASTRODUODENOSCOPY (EGD);  Surgeon: Manya Silvas, MD;  Location: [facility] ENDOSCOPY;  Service: Endoscopy;  Laterality: N/A;  . EUS N/A 2011, 2012  . EYE SURGERY  2013  . GLAUCOMA SURGERY Right 2013  . LAPAROSCOPIC RIGHT COLECTOMY Right 06/22/2015   Procedure: LAPAROSCOPIC RIGHT COLECTOMY;  Surgeon: Robert Bellow, MD;  Location: [facility] ORS;  Service: General;  Laterality: Right;  . NECK SURGERY  2006  . right  breast cancer    . SAVORY DILATION N/A 05/14/2015   Procedure: SAVORY DILATION;  Surgeon: Manya Silvas, MD;  Location: [facility] ENDOSCOPY;  Service: Endoscopy;  Laterality: N/A;  . TRABECULECTOMY Left 03/05/2016   Procedure: TRABECULECTOMY WITH [facility] AND EXPRESS SHUNT;  Surgeon: Leandrew Koyanagi, MD;  Location: [place];  Service: Ophthalmology;  Laterality: Left;  . TUBAL LIGATION      Vitals:   02/22/18 1304  BP: 132/63  Pulse: 85  SpO2: 100%    Subjective Assessment - 02/22/18 1304    Subjective  Pt reports she is having a bad day.  She took a vicoden this morning.  Pain is worst in the morning and then again ~5pm.  Pt believes she has improved since  starting therapy, especially is enjoying aquatic therapy. Pt fell yesterday but denies any injuries.     Pertinent History  72 yo Female with recent increase in back pain with subsequent numbness/tingling , weakness, and drop foot in B LEs, L>R starting about 3 months ago. She has had increased instability in gait and falls recently with minor injuries. Pt had recent epidural steroid injection L4-5, and L5-S1 with decrease in back pain since.Pt with extensive surgical and medical Hx including breast cancer (last surgery 2013), DJD with cervical fusion C3-5 in 2006, bipolar disorder, HTN, OA in bilateral shoulders L>R, hip and SIJ pain x >20 years.     Currently in Pain?  Yes    Pain Score  3     Pain Location  Back    Pain Orientation  Lower    Pain Type  Chronic pain    Pain Onset  More than a month ago         [facility] PT Assessment - 02/22/18 1324      Balance   Balance Assessed  Yes      Standardized Balance Assessment   Standardized Balance Assessment  Berg Balance Test      Berg Balance Test   Sit to Stand  Able to stand  independently using hands    Standing Unsupported  Able to stand safely 2 minutes    Sitting with Back Unsupported but Feet Supported on Floor or Stool  Able to sit safely and securely 2 minutes    Stand to Sit  Sits safely with minimal use of hands    Transfers  Able to transfer safely, minor use of hands    Standing Unsupported with Eyes Closed  Able to stand 10 seconds safely    Standing Ubsupported with Feet Together  Able to place feet together independently and stand for 1 minute with supervision    From Standing, Reach Forward with Outstretched Arm  Can reach confidently >25 cm (10")    From Standing Position, Pick up Object from Floor  Able to pick up shoe safely and easily    From Standing Position, Turn to Look Behind Over each Shoulder  Looks behind from both sides and weight shifts well    Turn 360 Degrees  Able to turn 360 degrees safely in 4 seconds  or less    Standing Unsupported, Alternately Place Feet on Step/Stool  Able to complete 4 steps without aid or supervision    Standing Unsupported, One Foot in [employer] balance while stepping or standing    Standing on One Leg  Unable to try or needs assist to prevent fall    Total Score  44        Merrilee Jansky  Balance Test: 44/56  5xSTS: 38 seconds with min assist x3 due to posterior LOB. pt did not use UEs. Cues beforehand to scoot to edge of chair when preparing to stand.   STS x5 without UE support with cues for anterior trunk lean and to scoot to edge of seat prior to standing. Pt with tendency for LOB posteriorly and requires min assist x4 due to imbalance.   Tandem stance x30 sec with R foot forward, x30 seconds with L foot forward. Intermittent UE support required.   Perturbations forward/back/left/right on firm ground. Cues provided for compensatory strategy specifically when she begins losing her balance posteriorly   AP rockerboard with cues to keep board still and cues provided for compensatory strategies when she begins losing her balance. Mirror feedback as well as pt does not realize she is off balance. Intermittent min assist due to instability.   Lateral rockerboard with intermittent min assist due to instability.             PT Education - 02/22/18 1300    Education provided  Yes    Education Details  POC moving forward; exercise technique; emphasized importance of completing HEP at least 3x/wk.    Person(s) Educated  Patient    Methods  Explanation;Demonstration;Verbal cues    Comprehension  Verbalized understanding;Returned demonstration;Verbal cues required;Need further instruction       PT Short Term Goals - 02/22/18 1308      PT SHORT TERM GOAL #1   Title  Patient will deny any falls over past 4 weeks to demonstrate improved safety awareness at home and work.     Baseline  has multiple near misses; fell in [employer] about a week ago; 3/4: pt reports  ~10 falls in the past 4 weeks    Time  4    Period  Weeks    Status  Not Met      PT SHORT TERM GOAL #2   Title  Pt will obtain and appropriately use LRAD (Rollator suggested) in order to decraese fall risk and increase community access.     Baseline  Pt has obtained Rollator for use outside    Time  2    Period  Weeks    Status  Achieved        PT Long Term Goals - 02/22/18 1309      PT LONG TERM GOAL #1   Title  Patient will be independent in home exercise program to improve strength/mobility for better functional independence with ADLs;     Baseline  Pt does part of her HEP about 3-4 times per week; 3/4: pt completing HEP 1/wk in addition to her therapy    Time  4    Period  Weeks    Status  Partially Met      PT LONG TERM GOAL #2   Title  Patient will increase Berg Balance score by > 6 points to demonstrate decreased fall risk during functional activities.; Deferred as patient's balance is more impaired dynamically and therefore FGA goal more appropriate;     Baseline  will defer at this time; 3/4: 44/56    Time  4    Period  Weeks    Status  Partially Met      PT LONG TERM GOAL #3   Title  Patient (> 100 years old) will complete five times sit to stand test in < 15 seconds indicating an increased LE strength and improved balance.    Baseline  15 sec without HHA;    Time  4    Period  Weeks    Status  Achieved      PT LONG TERM GOAL #4   Title  Patient will ascend/descend 4 stairs without rail assist independently without loss of balance to improve ability to get in/out of home.     Baseline  able to negotiate reciprocally but requires rail assist; 3:4: able to negotiate recriprocally but requires railing use for descending steps    Time  4    Period  Weeks    Status  Partially Met      PT LONG TERM GOAL #5   Title  Patient will increase BLE gross strength to 4/5 as to improve functional strength for independent gait, increased standing tolerance and increased ADL  ability.    Baseline  ankle 3+/5    Time  4    Period  Weeks    Status  Partially Met      PT LONG TERM GOAL #6   Title  Patient will increase Functional Gait Assessment score to >20/30 as to reduce fall risk and improve dynamic gait safety with community ambulation.    Baseline  20/30    Time  4    Period  Weeks    Status  Partially Met            Plan - 02/22/18 1301    Clinical Impression Statement  Pt has demonstrates some improvement in her functional mobility since starting therapy.  Pt is now able to ascend steps without UE support if she performs this slowly but requires 1 rail assist for descent due to instability.  Noticed that pt has tendency to lose her balance when she moves too quickly and pt was instructed to slow down when she begins to feel off balance in hopes to decrease her frequency of falls.  Pt scored a 44/56 on the [employer], indicating pt is at an increased risk of falling.  Pt demonstrates poor awareness of her instability and lack of compensatory strategies.  Practiced weight shift correcting with pertubations this session.  Pt instructed in compensatory strategies when she begins losing her balance posteriorly.  Pt will benefit from continued skilled PT interventions for improved balance, strength, and decreased pain.     Rehab Potential  Fair    Clinical Impairments Affecting Rehab Potential  Pt does not use AD and had decreased knowledge of her limitations    PT Frequency  1x / week    PT Duration  6 weeks    PT Treatment/Interventions  ADLs/Self Care Home Management;Aquatic Therapy;Biofeedback;Canalith Repostioning;Cryotherapy;Moist Heat;Cognitive remediation;Neuromuscular re-education;Balance training;Therapeutic exercise;Therapeutic activities;Functional mobility training;Stair training;Gait training;DME Instruction;Patient/family education;Orthotic Fit/Training;Manual techniques;Passive range of motion;Visual/perceptual  remediation/compensation;Vestibular;Energy conservation;Dry needling    PT Home Exercise Plan  continue as given;     Consulted and Agree with Plan of Care  Patient       Patient will benefit from skilled therapeutic intervention in order to improve the following deficits and impairments:  Abnormal gait, Decreased activity tolerance, Decreased balance, Decreased cognition, Decreased coordination, Decreased safety awareness, Decreased mobility, Decreased knowledge of use of DME, Decreased knowledge of precautions, Decreased endurance, Decreased skin integrity, Decreased strength, Difficulty walking, Dizziness, Postural dysfunction, Improper body mechanics, Impaired vision/preception, Impaired UE functional use, Impaired sensation, Hypomobility, Decreased range of motion, Impaired flexibility, Pain  Visit Diagnosis: Other lack of coordination  Other abnormalities of gait and mobility  Muscle weakness (generalized)     Problem  List Patient Active Problem List   Diagnosis Date Noted  . Ataxic gait 11/20/2017  . Gait instability 11/20/2017  . 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 Cognitive decline 11/20/2017  . PBA (pseudobulbar affect) 11/20/2017  . Truncal ataxia 11/20/2017  . Lumbar facet joint syndrome (Primary Area of Pain) (Bilateral) (L>R) 07/29/2017  . Chronic sacroiliac joint pain (Bilateral) (L>R) 07/29/2017  . Chronic hip pain (Bilateral) (L>R) 07/29/2017  . Chronic lower extremity pain (Secondary Area of Pain) (Bilateral) (L>R) 07/29/2017  . Impairment of balance 07/07/2017  . Menopausal and female climacteric states 06/15/2017  . Lumbar foraminal stenosis (T12-S1, multilevel) 06/10/2017  . Lumbar lateral recess stenosis (left L2-3, bilateral L3-4, & right L4-5) 06/10/2017  . Grade 1 Anterolisthesis of L4 over L5 and L5 over S1 06/10/2017  . Lumbar facet hypertrophy (multilevel) 06/10/2017  . Lumbar Ligamentum flavum hypertrophy (HCC) (multilevel) 06/10/2017  .  Dropfoot (Left) (L5 radiculopathy) 06/10/2017  . Neurogenic pain 06/09/2017  . DDD (degenerative disc disease), lumbar 06/09/2017  . 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 Closed fracture of metatarsal bone 05/06/2017  . 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 Abnormal MRI, cervical spine 05/06/2017  . 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[place]) 05/06/2017  . Cervical radiculitis 05/06/2017  . Chronic lumbar radiculopathy (Bilateral) (L>R) (left L5) 05/06/2017  . Chronic low back pain (Primary Area of Pain) (Bilateral) (L>R) 05/06/2017  . Abnormal MRI, lumbar spine 05/06/2017  . Chronic pain syndrome 03/19/2017  . Chronic shoulder pain (Fourth Area of Pain) (Bilateral) (L>R) 03/19/2017  . Osteoarthritis of shoulder (Bilateral) 03/19/2017  . Chronic neck pain [facility] Area of Pain) (Bilateral) (L>R) 03/19/2017  . History of C3-5 ACDF 03/19/2017  . Numbness and tingling in left upper extremity 03/19/2017  . Numbness and tingling of right upper extremity 03/19/2017  . Upper extremity weakness (Bilateral) (L>R) 03/19/2017  . Numbness of upper extremity (Bilateral) (L>R) 03/19/2017  . Numbness and tingling of lower extremity (Bilateral) (L>R) 03/19/2017  . Weakness of lower extremity (Bilateral) (R>L) 03/19/2017  . Chronic abdominal pain (epigastric) 03/19/2017  . Pituitary microadenoma ([place]) 12/08/2016  . History of breast cancer in adulthood 12/08/2016  . Dysmetria 12/08/2016  . Loss of weight 08/15/2015  . Colon polyp 05/25/2015  . Personal history of malignant neoplasm of breast 07/18/2014  . Breast cancer ([place]) 06/28/2014  . Bipolar disorder ([place]) 06/28/2014  . Chronic pancreatitis ([place]) 06/28/2014  . HTN (hypertension) 06/28/2014  . OA (osteoarthritis) 06/28/2014  . Renal insufficiency 06/28/2014  . DDD (degenerative disc disease), cervical 06/28/2014  . Hyperlipidemia  06/28/2014  . COPD (chronic obstructive pulmonary disease) ([place]) 05/17/2014  . Breast cancer of upper-outer quadrant of right female breast ([place]) 05/31/2008    Collie Siad PT, DPT 02/22/2018, 2:06 PM  [place] MAIN [facility] SERVICES [address] [place], [place], [address] Phone: [phone]   Fax:  [phone]  Name: Elaine Clayton MRN: [id number] Date of Birth: [birth year]

## 2018-03-04 ENCOUNTER — Other Ambulatory Visit: Payer: Self-pay | Admitting: Internal Medicine

## 2018-03-04 ENCOUNTER — Ambulatory Visit: Payer: Medicare Other

## 2018-03-04 ENCOUNTER — Other Ambulatory Visit: Payer: Self-pay

## 2018-03-04 DIAGNOSIS — M6281 Muscle weakness (generalized): Secondary | ICD-10-CM

## 2018-03-04 DIAGNOSIS — R270 Ataxia, unspecified: Secondary | ICD-10-CM

## 2018-03-04 DIAGNOSIS — R278 Other lack of coordination: Secondary | ICD-10-CM

## 2018-03-04 DIAGNOSIS — R2689 Other abnormalities of gait and mobility: Secondary | ICD-10-CM

## 2018-03-04 NOTE — Therapy (Signed)
[place] MAIN [facility] SERVICES [address] [place], [place], [address] Phone: [phone]   Fax:  [phone]  Physical Therapy Treatment  Patient Details  Name: Elaine Clayton MRN: [id number] Date of Birth: [birth year] Referring Provider: Dr. Joanna Hews    Encounter Date: 03/04/2018  PT End of Session - 03/04/18 1459    Visit Number  31    Number of Visits  55    Date for PT Re-Evaluation  03/31/18    PT Start Time  1130    PT Stop Time  1194    PT Time Calculation (min)  65 min    Activity Tolerance  Patient tolerated treatment well;No increased pain    Behavior During Therapy  WFL for tasks assessed/performed       Past Medical History:  Diagnosis Date  . Anemia   . Bipolar affective disorder ([place])    2  . Broken foot 2015   right foot  . Chronic gastritis   . Chronic kidney disease    Dr Holley Raring  . COPD (chronic obstructive pulmonary disease) ([place])   . Dizziness    inner ear (per pt) several times per week  . Dysphagia   . Glaucoma   . Hyperlipemia   . Hypertension    pt has recently come off BP meds.  MD aware. BP seems stable.  . Lithium toxicity 2015  . 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 Neck stiffness    s/p C3-C4 fusion, limited right turn  . Osteoarthritis    back  . Pancreatitis   . Personal history of malignant neoplasm of breast   . Pituitary microadenoma ([place])   . Recurrent UTI   . Renal insufficiency   . Stomach ulcer 2112    Past Surgical History:  Procedure Laterality Date  . APPENDECTOMY    . BREAST BIOPSY Right 2011   neg  . BREAST EXCISIONAL BIOPSY Right 2001   neg  . BREAST EXCISIONAL BIOPSY Right 2008   breast ca 2008 radiation  . BREAST EXCISIONAL BIOPSY Left 10/26/2012   neg  . BREAST SURGERY Left  2013   left breast wide excision,Intraductal papilloma, ductal hyperplasia and sclerosing adenosis.  Microcalcifications associated with columnar cell change. No evidence of atypia or malignancy. Margins are unremarkable.  Marland Kitchen BREAST SURGERY Right November 06, 1999   multiple areas of microcalcification showing evidence of sclerosing adenosis and ductal hyperplasia.  Marland Kitchen BREAST SURGERY Left May 07, 2007   wide excision.  . cataract Right 2013  . CATARACT EXTRACTION W/PHACO Left 03/05/2016   Procedure: CATARACT EXTRACTION PHACO AND INTRAOCULAR LENS PLACEMENT (IOC);  Surgeon: Leandrew Koyanagi, MD;  Location: [place];  Service: Ophthalmology;  Laterality: Left;  . COLONOSCOPY  2004  . COLONOSCOPY N/A 05/14/2015   Procedure: COLONOSCOPY;  Surgeon: Manya Silvas, MD;  Location[facility] END[facility];  Service: Endoscopy;  Laterality: N/A;  . ERCP N/A   . ESOPHAGOGASTRODUODENOSCOPY N/A 05/14/2015   Procedure: ESOPHAGOGASTRODUODENOSCOPY (EGD);  Surgeon: Manya Silvas, MD;  Location: [facility] ENDOSCOPY;  Service: Endoscopy;  Laterality: N/A;  . EUS N/A 2011, 2012  . EYE SURGERY  2013  . GLAUCOMA SURGERY Right 2013  . LAPAROSCOPIC RIGHT COLECTOMY Right 06/22/2015   Procedure: LAPAROSCOPIC RIGHT COLECTOMY;  Surgeon: Robert Bellow, MD;  Location: [facility] ORS;  Service: General;  Laterality: Right;  . NECK SURGERY  2006  . right  breast cancer    . SAVORY DILATION N/A 05/14/2015   Procedure: SAVORY DILATION;  Surgeon: Manya Silvas, MD;  Location: [facility] ENDOSCOPY;  Service: Endoscopy;  Laterality: N/A;  . TRABECULECTOMY Left 03/05/2016   Procedure: TRABECULECTOMY WITH [facility] AND EXPRESS SHUNT;  Surgeon: Leandrew Koyanagi, MD;  Location: [place];  Service: Ophthalmology;  Laterality: Left;  . TUBAL LIGATION      There were no vitals filed for this visit.  Subjective Assessment - 03/04/18 1455    Subjective  Pt reports having a poor balance day today. Pt states she would like to continue with aquatic therapy 2x per week, as she feels she makes strength/balance gains in the pool, as she  is better able to focus on form and correcting "the way her body usually wants to do things".     Pertinent History  72 yo Female with recent increase in back pain with subsequent numbness/tingling , weakness, and drop foot in B LEs, L>R starting about 3 months ago. She has had increased instability in gait and falls recently with minor injuries. Pt had recent epidural steroid injection L4-5, and L5-S1 with decrease in back pain since.Pt with extensive surgical and medical Hx including breast cancer (last surgery 2013), DJD with cervical fusion C3-5 in 2006, bipolar disorder, HTN, OA in bilateral shoulders L>R, hip and SIJ pain x >20 years.       Pt enters/exits pool via ramp Participates in the following   Ambulation  4 L fwd  4 L side, cues for toes forward  2 L high knee march  Rail  single side stepping side lunges R/L, 30x ea; light UE support  squat, 30x  Steps  step ups; R/L 30x ea  alternating taps, 1 min x 2  Core stabilization with UE strength (increased work at finding proper  position and form with core stabilization)  Red dumbbells, 3 x 10 ea   Triceps press downs   Sh abd/add   Sh flex/ext   Sh horiz abd/add                            PT Education - 03/04/18 1458    Education provided  Yes    Education Details  continued core stabilization; posture throughout ex/activity; attention to engaging muscles we are working    [employer]) Educated  Patient    Methods  Explanation;Demonstration    Comprehension  Verbalized understanding;Verbal cues required;Tactile cues required;Need further instruction       PT Short Term Goals - 02/22/18 1308      PT SHORT TERM GOAL #1   Title  Patient will deny any falls over past 4 weeks to demonstrate improved safety awareness at home and work.     Baseline  has multiple near misses; fell in [employer] about a week ago; 3/4: pt reports ~10 falls in the past 4 weeks    Time  4    Period  Weeks    Status   Not Met      PT SHORT TERM GOAL #2   Title  Pt will obtain and appropriately use LRAD (Rollator suggested) in order to decraese fall risk and increase community access.     Baseline  Pt has obtained Rollator for use outside    Time  2    Period  Weeks    Status  Achieved        PT Long Term Goals -  02/22/18 1309      PT LONG TERM GOAL #1   Title  Patient will be independent in home exercise program to improve strength/mobility for better functional independence with ADLs;     Baseline  Pt does part of her HEP about 3-4 times per week; 3/4: pt completing HEP 1/wk in addition to her therapy    Time  4    Period  Weeks    Status  Partially Met      PT LONG TERM GOAL #2   Title  Patient will increase Berg Balance score by > 6 points to demonstrate decreased fall risk during functional activities.; Deferred as patient's balance is more impaired dynamically and therefore FGA goal more appropriate;     Baseline  will defer at this time; 3/4: 44/56    Time  4    Period  Weeks    Status  Partially Met      PT LONG TERM GOAL #3   Title  Patient (> 40 years old) will complete five times sit to stand test in < 15 seconds indicating an increased LE strength and improved balance.    Baseline  15 sec without HHA;    Time  4    Period  Weeks    Status  Achieved      PT LONG TERM GOAL #4   Title  Patient will ascend/descend 4 stairs without rail assist independently without loss of balance to improve ability to get in/out of home.     Baseline  able to negotiate reciprocally but requires rail assist; 3:4: able to negotiate recriprocally but requires railing use for descending steps    Time  4    Period  Weeks    Status  Partially Met      PT LONG TERM GOAL #5   Title  Patient will increase BLE gross strength to 4/5 as to improve functional strength for independent gait, increased standing tolerance and increased ADL ability.    Baseline  ankle 3+/5    Time  4    Period  Weeks    Status   Partially Met      PT LONG TERM GOAL #6   Title  Patient will increase Functional Gait Assessment score to >20/30 as to reduce fall risk and improve dynamic gait safety with community ambulation.    Baseline  20/30    Time  4    Period  Weeks    Status  Partially Met            Plan - 03/04/18 1500    Clinical Impression Statement  Pt continues to require verbal and tactile cueing/assist for postural corrections with ambulation and core stabilization as well as proper technique for target muscle groups, but with repeated reps, pt "feels" correct muscles working. Pt demonstrated overall good balance in the water today; although smaller stride required with ambulation.    Rehab Potential  Fair    Clinical Impairments Affecting Rehab Potential  Pt does not use AD and had decreased knowledge of her limitations    PT Frequency  1x / week    PT Duration  6 weeks    PT Treatment/Interventions  ADLs/Self Care Home Management;Aquatic Therapy;Biofeedback;Canalith Repostioning;Cryotherapy;Moist Heat;Cognitive remediation;Neuromuscular re-education;Balance training;Therapeutic exercise;Therapeutic activities;Functional mobility training;Stair training;Gait training;DME Instruction;Patient/family education;Orthotic Fit/Training;Manual techniques;Passive range of motion;Visual/perceptual remediation/compensation;Vestibular;Energy conservation;Dry needling    PT Home Exercise Plan  continue as given;     Consulted and Agree with Plan of Care  Patient  Patient will benefit from skilled therapeutic intervention in order to improve the following deficits and impairments:  Abnormal gait, Decreased activity tolerance, Decreased balance, Decreased cognition, Decreased coordination, Decreased safety awareness, Decreased mobility, Decreased knowledge of use of DME, Decreased knowledge of precautions, Decreased endurance, Decreased skin integrity, Decreased strength, Difficulty walking, Dizziness, Postural  dysfunction, Improper body mechanics, Impaired vision/preception, Impaired UE functional use, Impaired sensation, Hypomobility, Decreased range of motion, Impaired flexibility, Pain  Visit Diagnosis: Other lack of coordination  Other abnormalities of gait and mobility  Muscle weakness (generalized)     Problem List Patient Active Problem List   Diagnosis Date Noted  . Ataxic gait 11/20/2017  . Gait instability 11/20/2017  . 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 Cognitive decline 11/20/2017  . PBA (pseudobulbar affect) 11/20/2017  . Truncal ataxia 11/20/2017  . Lumbar facet joint syndrome (Primary Area of Pain) (Bilateral) (L>R) 07/29/2017  . Chronic sacroiliac joint pain (Bilateral) (L>R) 07/29/2017  . Chronic hip pain (Bilateral) (L>R) 07/29/2017  . Chronic lower extremity pain (Secondary Area of Pain) (Bilateral) (L>R) 07/29/2017  . Impairment of balance 07/07/2017  . Menopausal and female climacteric states 06/15/2017  . Lumbar foraminal stenosis (T12-S1, multilevel) 06/10/2017  . Lumbar lateral recess stenosis (left L2-3, bilateral L3-4, & right L4-5) 06/10/2017  . Grade 1 Anterolisthesis of L4 over L5 and L5 over S1 06/10/2017  . Lumbar facet hypertrophy (multilevel) 06/10/2017  . Lumbar Ligamentum flavum hypertrophy (HCC) (multilevel) 06/10/2017  . Dropfoot (Left) (L5 radiculopathy) 06/10/2017  . Neurogenic pain 06/09/2017  . DDD (degenerative disc disease), lumbar 06/09/2017  . 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 Closed fracture of metatarsal bone 05/06/2017  . 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 Abnormal MRI, cervical spine 05/06/2017  . 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[place]) 05/06/2017  . Cervical radiculitis 05/06/2017  . Chronic lumbar radiculopathy (Bilateral) (L>R) (left L5) 05/06/2017  . Chronic low back pain (Primary Area of Pain)  (Bilateral) (L>R) 05/06/2017  . Abnormal MRI, lumbar spine 05/06/2017  . Chronic pain syndrome 03/19/2017  . Chronic shoulder pain (Fourth Area of Pain) (Bilateral) (L>R) 03/19/2017  . Osteoarthritis of shoulder (Bilateral) 03/19/2017  . Chronic neck pain [facility] Area of Pain) (Bilateral) (L>R) 03/19/2017  . History of C3-5 ACDF 03/19/2017  . Numbness and tingling in left upper extremity 03/19/2017  . Numbness and tingling of right upper extremity 03/19/2017  . Upper extremity weakness (Bilateral) (L>R) 03/19/2017  . Numbness of upper extremity (Bilateral) (L>R) 03/19/2017  . Numbness and tingling of lower extremity (Bilateral) (L>R) 03/19/2017  . Weakness of lower extremity (Bilateral) (R>L) 03/19/2017  . Chronic abdominal pain (epigastric) 03/19/2017  . Pituitary microadenoma ([place]) 12/08/2016  . History of breast cancer in adulthood 12/08/2016  . Dysmetria 12/08/2016  . Loss of weight 08/15/2015  . Colon polyp 05/25/2015  . Personal history of malignant neoplasm of breast 07/18/2014  . Breast cancer ([place]) 06/28/2014  . Bipolar disorder ([place]) 06/28/2014  . Chronic pancreatitis ([place]) 06/28/2014  . HTN (hypertension) 06/28/2014  . OA (osteoarthritis) 06/28/2014  . Renal insufficiency 06/28/2014  . DDD (degenerative disc disease), cervical 06/28/2014  . Hyperlipidemia 06/28/2014  . COPD (chronic obstructive pulmonary disease) ([place]) 05/17/2014  . Breast cancer of upper-outer quadrant of right female breast ([place]) 05/31/2008    Larae Grooms 03/04/2018, 3:04 PM  [place] MAIN [facility] SERVICES [place], [place], [address] Phone: [phone]  Fax:  [phone]  Name: Elaine Clayton MRN: [id number] Date of Birth: 1946-08-17

## 2018-03-11 ENCOUNTER — Other Ambulatory Visit: Payer: Self-pay

## 2018-03-11 ENCOUNTER — Ambulatory Visit (HOSPITAL_BASED_OUTPATIENT_CLINIC_OR_DEPARTMENT_OTHER): Payer: Medicare Other | Admitting: Pain Medicine

## 2018-03-11 ENCOUNTER — Encounter: Payer: Self-pay | Admitting: Pain Medicine

## 2018-03-11 ENCOUNTER — Ambulatory Visit
Admission: RE | Admit: 2018-03-11 | Discharge: 2018-03-11 | Disposition: A | Discharge status: Home / Self Care | Payer: Medicare Other | Source: Ambulatory Visit | Attending: Pain Medicine | Admitting: Pain Medicine

## 2018-03-11 ENCOUNTER — Ambulatory Visit: Payer: Medicare Other

## 2018-03-11 VITALS — BP 148/72 | HR 70 | Temp 98.0°F | Resp 11 | Ht 62.0 in | Wt 103.0 lb

## 2018-03-11 DIAGNOSIS — Z79899 Other long term (current) drug therapy: Secondary | ICD-10-CM | POA: Diagnosis not present

## 2018-03-11 DIAGNOSIS — M47817 Spondylosis without myelopathy or radiculopathy, lumbosacral region: Secondary | ICD-10-CM

## 2018-03-11 DIAGNOSIS — G894 Chronic pain syndrome: Secondary | ICD-10-CM | POA: Insufficient documentation

## 2018-03-11 DIAGNOSIS — Z882 Allergy status to sulfonamides status: Secondary | ICD-10-CM | POA: Diagnosis not present

## 2018-03-11 DIAGNOSIS — M5136 Other intervertebral disc degeneration, lumbar region: Secondary | ICD-10-CM | POA: Diagnosis not present

## 2018-03-11 DIAGNOSIS — M5388 Other specified dorsopathies, sacral and sacrococcygeal region: Secondary | ICD-10-CM | POA: Insufficient documentation

## 2018-03-11 DIAGNOSIS — M5441 Lumbago with sciatica, right side: Secondary | ICD-10-CM

## 2018-03-11 DIAGNOSIS — M47816 Spondylosis without myelopathy or radiculopathy, lumbar region: Secondary | ICD-10-CM | POA: Diagnosis not present

## 2018-03-11 DIAGNOSIS — M51369 Other intervertebral disc degeneration, lumbar region without mention of lumbar back pain or lower extremity pain: Secondary | ICD-10-CM

## 2018-03-11 DIAGNOSIS — M5442 Lumbago with sciatica, left side: Secondary | ICD-10-CM | POA: Insufficient documentation

## 2018-03-11 DIAGNOSIS — M533 Sacrococcygeal disorders, not elsewhere classified: Secondary | ICD-10-CM | POA: Insufficient documentation

## 2018-03-11 DIAGNOSIS — G8929 Other chronic pain: Secondary | ICD-10-CM

## 2018-03-11 MED ORDER — MIDAZOLAM HCL 5 MG/5ML IJ SOLN
INTRAMUSCULAR | Status: AC
  Filled 2018-03-11: qty 5

## 2018-03-11 MED ORDER — LIDOCAINE HCL 2 % IJ SOLN
INTRAMUSCULAR | Status: AC
  Filled 2018-03-11: qty 20

## 2018-03-11 MED ORDER — FENTANYL CITRATE (PF) 100 MCG/2ML IJ SOLN
INTRAMUSCULAR | Status: AC
  Filled 2018-03-11: qty 2

## 2018-03-11 MED ORDER — TRIAMCINOLONE ACETONIDE 40 MG/ML IJ SUSP
40.0000 mg | Freq: Once | INTRAMUSCULAR | Status: AC
  Administered 2018-03-11: 40 mg

## 2018-03-11 MED ORDER — HYDROCODONE-ACETAMINOPHEN 5-325 MG PO TABS: 1.0000 | ORAL_TABLET | Freq: Four times a day (QID) | ORAL | 0 refills | Status: DC | PRN

## 2018-03-11 MED ORDER — FENTANYL CITRATE (PF) 100 MCG/2ML IJ SOLN
25.0000 ug | INTRAMUSCULAR | Status: AC | PRN
  Administered 2018-03-11 (×2): 50 ug via INTRAVENOUS

## 2018-03-11 MED ORDER — TRIAMCINOLONE ACETONIDE 40 MG/ML IJ SUSP
INTRAMUSCULAR | Status: AC
  Filled 2018-03-11: qty 2

## 2018-03-11 MED ORDER — ROPIVACAINE HCL 2 MG/ML IJ SOLN
9.0000 mL | Freq: Once | INTRAMUSCULAR | Status: AC
  Administered 2018-03-11: 10 mL via PERINEURAL

## 2018-03-11 MED ORDER — LIDOCAINE HCL 2 % IJ SOLN
20.0000 mL | Freq: Once | INTRAMUSCULAR | Status: AC
  Administered 2018-03-11: 400 mg

## 2018-03-11 MED ORDER — ROPIVACAINE HCL 2 MG/ML IJ SOLN
INTRAMUSCULAR | Status: AC
  Filled 2018-03-11: qty 20

## 2018-03-11 MED ORDER — MIDAZOLAM HCL 5 MG/5ML IJ SOLN
1.0000 mg | INTRAMUSCULAR | Status: DC | PRN
  Administered 2018-03-11: 1.5 mg via INTRAVENOUS

## 2018-03-11 MED ORDER — LACTATED RINGERS IV SOLN
1000.0000 mL | Freq: Once | INTRAVENOUS | Status: AC
  Administered 2018-03-11: 1000 mL via INTRAVENOUS

## 2018-03-11 NOTE — Patient Instructions (Addendum)
Gabapentin capsules or tablets What is this medicine? GABAPENTIN (GA ba pen tin) is used to control partial seizures in adults with epilepsy. It is also used to treat certain types of nerve pain. This medicine may be used for other purposes; ask your health care provider or pharmacist if you have questions. COMMON BRAND NAME(S): Active-PAC with Gabapentin, Gabarone, Neurontin What should I tell my health care provider before I take this medicine? They need to know if you have any of these conditions: -kidney disease -suicidal thoughts, plans, or attempt; a previous suicide attempt by you or a family member -an unusual or allergic reaction to gabapentin, other medicines, foods, dyes, or preservatives -pregnant or trying to get pregnant -breast-feeding How should I use this medicine? Take this medicine by mouth with a glass of water. Follow the directions on the prescription label. You can take it with or without food. If it upsets your stomach, take it with food.Take your medicine at regular intervals. Do not take it more often than directed. Do not stop taking except on your doctor's advice. If you are directed to break the 600 or 800 mg tablets in half as part of your dose, the extra half tablet should be used for the next dose. If you have not used the extra half tablet within 28 days, it should be thrown away. A special MedGuide will be given to you by the pharmacist with each prescription and refill. Be sure to read this information carefully each time. Talk to your pediatrician regarding the use of this medicine in children. Special care may be needed. Overdosage: If you think you have taken too much of this medicine contact a poison control center or emergency room at once. NOTE: This medicine is only for you. Do not share this medicine with others. What if I miss a dose? If you miss a dose, take it as soon as you can. If it is almost time for your next dose, take only that dose. Do not  take double or extra doses. What may interact with this medicine? Do not take this medicine with any of the following medications: -other gabapentin products This medicine may also interact with the following medications: -alcohol -antacids -antihistamines for allergy, cough and cold -certain medicines for anxiety or sleep -certain medicines for depression or psychotic disturbances -homatropine; hydrocodone -naproxen -narcotic medicines (opiates) for pain -phenothiazines like chlorpromazine, mesoridazine, prochlorperazine, thioridazine This list may not describe all possible interactions. Give your health care provider a list of all the medicines, herbs, non-prescription drugs, or dietary supplements you use. Also tell them if you smoke, drink alcohol, or use illegal drugs. Some items may interact with your medicine. What should I watch for while using this medicine? Visit your doctor or health care professional for regular checks on your progress. You may want to keep a record at home of how you feel your condition is responding to treatment. You may want to share this information with your doctor or health care professional at each visit. You should contact your doctor or health care professional if your seizures get worse or if you have any new types of seizures. Do not stop taking this medicine or any of your seizure medicines unless instructed by your doctor or health care professional. Stopping your medicine suddenly can increase your seizures or their severity. Wear a medical identification bracelet or chain if you are taking this medicine for seizures, and carry a card that lists all your medications. You may get drowsy, dizzy,   or have blurred vision. Do not drive, use machinery, or do anything that needs mental alertness until you know how this medicine affects you. To reduce dizzy or fainting spells, do not sit or stand up quickly, especially if you are an older patient. Alcohol can  increase drowsiness and dizziness. Avoid alcoholic drinks. Your mouth may get dry. Chewing sugarless gum or sucking hard candy, and drinking plenty of water will help. The use of this medicine may increase the chance of suicidal thoughts or actions. Pay special attention to how you are responding while on this medicine. Any worsening of mood, or thoughts of suicide or dying should be reported to your health care professional right away. Women who become pregnant while using this medicine may enroll in the [place] Pregnancy Registry by calling [phone]. This registry collects information about the safety of antiepileptic drug use during pregnancy. What side effects may I notice from receiving this medicine? Side effects that you should report to your doctor or health care professional as soon as possible: -allergic reactions like skin rash, itching or hives, swelling of the face, lips, or tongue -worsening of mood, thoughts or actions of suicide or dying Side effects that usually do not require medical attention (report to your doctor or health care professional if they continue or are bothersome): -constipation -difficulty walking or controlling muscle movements -dizziness -nausea -slurred speech -tiredness -tremors -weight gain This list may not describe all possible side effects. Call your doctor for medical advice about side effects. You may report side effects to FDA at 1-800-FDA-1088. Where should I keep my medicine? Keep out of reach of children. This medicine may cause accidental overdose and death if it taken by other adults, children, or pets. Mix any unused medicine with a substance like cat litter or coffee grounds. Then throw the medicine away in a sealed container like a sealed bag or a coffee can with a lid. Do not use the medicine after the expiration date. Store at room temperature between 15 and 30 degrees C (59 and 86 degrees F). NOTE: This  sheet is a summary. It may not cover all possible information. If you have questions about this medicine, talk to your doctor, pharmacist, or health care provider.  2018 Elsevier/Gold Standard (2014-02-03 15:26:50)   ____________________________________________________________________________________________  Post-Procedure Discharge Instructions  Instructions:  Apply ice: Fill a plastic sandwich bag with crushed ice. Cover it with a small towel and apply to injection site. Apply for 15 minutes then remove x 15 minutes. Repeat sequence on day of procedure, until you go to bed. The purpose is to minimize swelling and discomfort after procedure.  Apply heat: Apply heat to procedure site starting the day following the procedure. The purpose is to treat any soreness and discomfort from the procedure.  Food intake: Start with clear liquids (like water) and advance to regular food, as tolerated.   Physical activities: Keep activities to a minimum for the first 8 hours after the procedure.   Driving: If you have received any sedation, you are not allowed to drive for 24 hours after your procedure.  Blood thinner: Restart your blood thinner 6 hours after your procedure. (Only for those taking blood thinners)  Insulin: As soon as you can eat, you may resume your normal dosing schedule. (Only for those taking insulin)  Infection prevention: Keep procedure site clean and dry.  Post-procedure Pain Diary: Extremely important that this be done correctly and accurately. Recorded information will be used to determine  the next step in treatment.  Pain evaluated is that of treated area only. Do not include pain from an untreated area.  Complete every hour, on the hour, for the initial 8 hours. Set an alarm to help you do this part accurately.  Do not go to sleep and have it completed later. It will not be accurate.  Follow-up appointment: Keep your follow-up appointment after the procedure. Usually  2 weeks for most procedures. (6 weeks in the case of radiofrequency.) Bring you pain diary.   Expect:  From numbing medicine (AKA: Local Anesthetics): Numbness or decrease in pain.  Onset: Full effect within 15 minutes of injected.  Duration: It will depend on the type of local anesthetic used. On the average, 1 to 8 hours.   From steroids: Decrease in swelling or inflammation. Once inflammation is improved, relief of the pain will follow.  Onset of benefits: Depends on the amount of swelling present. The more swelling, the longer it will take for the benefits to be seen. In some cases, up to 10 days.  Duration: Steroids will stay in the system x 2 weeks. Duration of benefits will depend on multiple posibilities including persistent irritating factors.  From procedure: Some discomfort is to be expected once the numbing medicine wears off. This should be minimal if ice and heat are applied as instructed.  Call if:  You experience numbness and weakness that gets worse with time, as opposed to wearing off.  New onset bowel or bladder incontinence. (This applies to Spinal procedures only)  Emergency Numbers:  [place] business hours (Monday - Thursday, 8:00 AM - 4:00 PM) (Friday, 9:00 AM - 12:00 Noon): [phone]  After hours: [phone] ____________________________________________________________________________________________

## 2018-03-11 NOTE — Progress Notes (Signed)
Safety precautions to be maintained throughout the outpatient stay will include: orient to surroundings, keep bed in low position, maintain call bell within reach at all times, provide assistance with transfer out of bed and ambulation.  

## 2018-03-11 NOTE — Progress Notes (Signed)
Patient's Name: Elaine Clayton  MRN: [id number]  Referring Provider: Milinda Pointer, MD  DOB: 1946/01/27  PCP: Idelle Crouch, MD  DOS: 03/11/2018  Note by: Gaspar Cola, MD  Service setting: Ambulatory outpatient  Specialty: Interventional Pain Management  Patient type: Established  Location: [facility] (AMB) Pain Management Facility  Visit type: Interventional Procedure   Primary Reason for Visit: Interventional Pain Management Treatment. CC: Back Pain (left lower)  Procedure:       Anesthesia, Analgesia, Anxiolysis:  Type: Thermal Lumbar Facet, Medial Branch & Sacroiliac joint Radiofrequency Ablation Region: Lumbosacral Level: L2, L3, L4, L5, S1, S2, & S3 Medial Branch Level(s). These levels will denervate the L3-4, L4-5, and the L5-S1 lumbar facet joints, as well as the posterior Sacroiliac Joint innervation. Primary Purpose: Therapeutic Region: Posterolateral Lumbosacral Spine Laterality: Right  Type: Moderate (Conscious) Sedation combined with Local Anesthesia Indication(s): Analgesia and Anxiety Route: Intravenous (IV) IV Access: Secured Sedation: Meaningful verbal contact was maintained at all times during the procedure  Local Anesthetic: Lidocaine 1-2%   Indications: 1. Spondylosis without myelopathy or radiculopathy, lumbosacral region   2. Lumbar facet syndrome (Bilateral) (L>R)   3. Other specified dorsopathies, sacral and sacrococcygeal region   4. Chronic sacroiliac joint pain (Bilateral) (L>R)   5. Chronic low back pain (Primary Area of Pain) (Bilateral) (L>R)   6. DDD (degenerative disc disease), lumbar   7. Lumbar facet joint syndrome   8. Chronic sacroiliac joint pain   9. Chronic bilateral low back pain with bilateral sciatica   10. Chronic pain syndrome    Elaine Clayton has been dealing with the above chronic pain for longer than three months and has either failed to respond, was unable to tolerate, or simply did not get enough benefit from other more  conservative therapies including, but not limited to: 1. Over-the-counter medications 2. Anti-inflammatory medications 3. Muscle relaxants 4. Membrane stabilizers 5. Opioids 6. Physical therapy 7. Modalities (Heat, ice, etc.) 8. Invasive techniques such as nerve blocks. Elaine Clayton has attained more than 50% relief of the pain from a series of diagnostic injections conducted in separate occasions.  Pain Score: Pre-procedure: 7 /10 Post-procedure: 0-No pain/10  Pre-op Assessment:  Elaine Clayton is a 72 y.o. (year old), female patient, seen today for interventional treatment. She  has a past surgical history that includes Colonoscopy (2004); Neck surgery (2006); Eye surgery (2013); Appendectomy; Tubal ligation; Colonoscopy (N/A, 05/14/2015); Esophagogastroduodenoscopy (N/A, 05/14/2015); Savory dilation (N/A, 05/14/2015); ERCP (N/A); Glaucoma surgery (Right, 2013); cataract (Right, 2013); Breast surgery (Left,  2013); Breast surgery (Right, November 06, 1999); Breast surgery (Left, May 07, 2007); EUS (N/A, 2011, 2012); Laparoscopic right colectomy (Right, 06/22/2015); right breast cancer; Trabeculectomy (Left, 03/05/2016); Cataract extraction w/PHACO (Left, 03/05/2016); Breast biopsy (Right, 2011); Breast excisional biopsy (Right, 2001); Breast excisional biopsy (Right, 2008); and Breast excisional biopsy (Left, 10/26/2012). Elaine Clayton has a current medication list which includes the following prescription(s): alprazolam, azopt, bisacodyl, brimonidine tartrate-timolol, bupropion, dextromethorphan, escitalopram, estradiol-levonorgestrel, fluticasone furoate-vilanterol, gabapentin, hydrocodone-acetaminophen, ipratropium, losartan, lumigan, nicotine polacrilex, ondansetron, rosuvastatin, and ventolin hfa, and the following Facility-Administered Medications: midazolam. Her primarily concern today is the Back Pain (left lower)  Initial Vital Signs:  Pulse Rate: 70 Temp: (!) 97.2 F (36.2 C) Resp: 18 BP: (!)  133/55 SpO2: 100 %  BMI: Estimated body mass index is 18.84 kg/m as calculated from the following:   Height as of this encounter: 5\' 2"  (1.575 m).   Weight as of this encounter: 103 lb (46.7 kg).  Risk Assessment:  Allergies: Reviewed. She is allergic to influenza vaccines; flu virus vaccine; sulfa antibiotics; and tetracyclines & related.  Allergy Precautions: None required Coagulopathies: Reviewed. None identified.  Blood-thinner therapy: None at this time Active Infection(s): Reviewed. None identified. Elaine Clayton is afebrile  Site Confirmation: Elaine Clayton was asked to confirm the procedure and laterality before marking the site Procedure checklist: Completed Consent: Before the procedure and under the influence of no sedative(s), amnesic(s), or anxiolytics, the patient was informed of the treatment options, risks and possible complications. To fulfill our ethical and legal obligations, as recommended by the American Medical Association's Code of Ethics, I have informed the patient of my clinical impression; the nature and purpose of the treatment or procedure; the risks, benefits, and possible complications of the intervention; the alternatives, including doing nothing; the risk(s) and benefit(s) of the alternative treatment(s) or procedure(s); and the risk(s) and benefit(s) of doing nothing. The patient was provided information about the general risks and possible complications associated with the procedure. These may include, but are not limited to: failure to achieve desired goals, infection, bleeding, organ or nerve damage, allergic reactions, paralysis, and death. In addition, the patient was informed of those risks and complications associated to Spine-related procedures, such as failure to decrease pain; infection (i.e.: Meningitis, epidural or intraspinal abscess); bleeding (i.e.: epidural hematoma, subarachnoid hemorrhage, or any other type of intraspinal or peri-dural bleeding);  organ or nerve damage (i.e.: Any type of peripheral nerve, nerve root, or spinal cord injury) with subsequent damage to sensory, motor, and/or autonomic systems, resulting in permanent pain, numbness, and/or weakness of one or several areas of the body; allergic reactions; (i.e.: anaphylactic reaction); and/or death. Furthermore, the patient was informed of those risks and complications associated with the medications. These include, but are not limited to: allergic reactions (i.e.: anaphylactic or anaphylactoid reaction(s)); adrenal axis suppression; blood sugar elevation that in diabetics may result in ketoacidosis or comma; water retention that in patients with history of congestive heart failure may result in shortness of breath, pulmonary edema, and decompensation with resultant heart failure; weight gain; swelling or edema; medication-induced neural toxicity; particulate matter embolism and blood vessel occlusion with resultant organ, and/or nervous system infarction; and/or aseptic necrosis of one or more joints. Finally, the patient was informed that Medicine is not an exact science; therefore, there is also the possibility of unforeseen or unpredictable risks and/or possible complications that may result in a catastrophic outcome. The patient indicated having understood very clearly. We have given the patient no guarantees and we have made no promises. Enough time was given to the patient to ask questions, all of which were answered to the patient's satisfaction. Elaine Clayton has indicated that she wanted to continue with the procedure. Attestation: I, the ordering provider, attest that I have discussed with the patient the benefits, risks, side-effects, alternatives, likelihood of achieving goals, and potential problems during recovery for the procedure that I have provided informed consent. Date  Time: 03/11/2018 10:21 AM  Pre-Procedure Preparation:  Monitoring: As per clinic protocol. Respiration,  ETCO2, SpO2, BP, heart rate and rhythm monitor placed and checked for adequate function Safety Precautions: Patient was assessed for positional comfort and pressure points before starting the procedure. Time-out: I initiated and conducted the "Time-out" before starting the procedure, as per protocol. The patient was asked to participate by confirming the accuracy of the "Time Out" information. Verification of the correct person, site, and procedure were performed and confirmed by me, the nursing staff, and the patient. "Time-out"  conducted as per Joint Commission's Universal Protocol (UP.01.01.01). Time: 1138  Description of Procedure:       Position: Prone Laterality: Right Level: L2, L3, L4, L5, S1, S2, & S3 Medial Branch Level(s), at the L3-4, L4-5, and the L5-S1 lumbar facet joints, as well as the posterior Sacroiliac Joint. Area Prepped: Lumbosacral Prepping solution: ChloraPrep (2% chlorhexidine gluconate and 70% isopropyl alcohol) Safety Precautions: Aspiration looking for blood return was conducted prior to all injections. At no point did we inject any substances, as a needle was being advanced. Before injecting, the patient was told to immediately notify me if she was experiencing any new onset of "ringing in the ears, or metallic taste in the mouth". No attempts were made at seeking any paresthesias. Safe injection practices and needle disposal techniques used. Medications properly checked for expiration dates. SDV (single dose vial) medications used. After the completion of the procedure, all disposable equipment used was discarded in the proper designated medical waste containers. Local Anesthesia: Protocol guidelines were followed. The patient was positioned over the fluoroscopy table. The area was prepped in the usual manner. The time-out was completed. The target area was identified using fluoroscopy. A 12-in long, straight, sterile hemostat was used with fluoroscopic guidance to locate  the targets for each level blocked. Once located, the skin was marked with an approved surgical skin marker. Once all sites were marked, the skin (epidermis, dermis, and hypodermis), as well as deeper tissues (fat, connective tissue and muscle) were infiltrated with a small amount of a short-acting local anesthetic, loaded on a 10cc syringe with a 25G, 1.5-in  Needle. An appropriate amount of time was allowed for local anesthetics to take effect before proceeding to the next step. Local Anesthetic: Lidocaine 2.0% The unused portion of the local anesthetic was discarded in the proper designated containers. Technical explanation of process:  Radiofrequency Ablation (RFA) L2 Medial Branch Nerve RFA: The target area for the L2 medial branch is at the junction of the postero-lateral aspect of the superior articular process and the superior, posterior, and medial edge of the transverse process of L3. Under fluoroscopic guidance, a Radiofrequency needle was inserted until contact was made with os over the superior postero-lateral aspect of the pedicular shadow (target area). Sensory and motor testing was conducted to properly adjust the position of the needle. Once satisfactory placement of the needle was achieved, the numbing solution was slowly injected after negative aspiration for blood. 2.0 mL of the nerve block solution was injected without difficulty or complication. After waiting for at least 3 minutes, the ablation was performed. Once completed, the needle was removed intact. L3 Medial Branch Nerve RFA: The target area for the L3 medial branch is at the junction of the postero-lateral aspect of the superior articular process and the superior, posterior, and medial edge of the transverse process of L4. Under fluoroscopic guidance, a Radiofrequency needle was inserted until contact was made with os over the superior postero-lateral aspect of the pedicular shadow (target area). Sensory and motor testing was  conducted to properly adjust the position of the needle. Once satisfactory placement of the needle was achieved, the numbing solution was slowly injected after negative aspiration for blood. 2.0 mL of the nerve block solution was injected without difficulty or complication. After waiting for at least 3 minutes, the ablation was performed. Once completed, the needle was removed intact. L4 Medial Branch Nerve RFA: The target area for the L4 medial branch is at the junction of the postero-lateral aspect of  the superior articular process and the superior, posterior, and medial edge of the transverse process of L5. Under fluoroscopic guidance, a Radiofrequency needle was inserted until contact was made with os over the superior postero-lateral aspect of the pedicular shadow (target area). Sensory and motor testing was conducted to properly adjust the position of the needle. Once satisfactory placement of the needle was achieved, the numbing solution was slowly injected after negative aspiration for blood. 2.0 mL of the nerve block solution was injected without difficulty or complication. After waiting for at least 3 minutes, the ablation was performed. Once completed, the needle was removed intact. L5 Medial Branch Nerve RFA: The target area for the L5 medial branch is at the junction of the postero-lateral aspect of the superior articular process of S1 and the superior, posterior, and medial edge of the sacral ala. Under fluoroscopic guidance, a Radiofrequency needle was inserted until contact was made with os over the superior postero-lateral aspect of the pedicular shadow (target area). Sensory and motor testing was conducted to properly adjust the position of the needle. Once satisfactory placement of the needle was achieved, the numbing solution was slowly injected after negative aspiration for blood. 2.0 mL of the nerve block solution was injected without difficulty or complication. After waiting for at least 3  minutes, the ablation was performed. Once completed, the needle was removed intact. S1 Primary Dorsal Rami and Lateral Branch Nerve RFA: The target area for the S1 medial branch is located inferior to the junction of the S1 superior articular process and the L5 inferior articular process, posterior, inferior, and lateral to the 6 o'clock position of the L5-S1 facet joint, just superior to the S1 posterior foramen. Under fluoroscopic guidance, the Radiofrequency needle was advanced until contact was made with os over the Target area. Sensory and motor testing was conducted to properly adjust the position of the needle. Once satisfactory placement of the needle was achieved, the numbing solution was slowly injected after negative aspiration for blood. 2.0 mL of the nerve block solution was injected without difficulty or complication. After waiting for at least 3 minutes, the ablation was performed. Once completed, the needle was removed intact. S2 Primary Dorsal Rami and Lateral Branch Nerve RFA: The target area for the S2 medial branch is at the posterior superior lateral of the S2 posterior neural foramen. Under fluoroscopic guidance, the Radiofrequency needle was advanced until contact was made with os over the Target area. Sensory and motor testing was conducted to properly adjust the position of the needle. Once satisfactory placement of the needle was achieved, the numbing solution was slowly injected after negative aspiration for blood. 2.0 mL of the nerve block solution was injected without difficulty or complication. After waiting for at least 3 minutes, the ablation was performed. Once completed, the needle was removed intact. S3 Primary Dorsal Rami and Lateral Branch Nerve RFA: The target area for the S3 medial branch is at the posterior superior lateral of the S3 posterior neural foramen. Under fluoroscopic guidance, the Radiofrequency needle was advanced until contact was made with os over the Target  area. Sensory and motor testing was conducted to properly adjust the position of the needle. Once satisfactory placement of the needle was achieved, the numbing solution was slowly injected after negative aspiration for blood. 2.0 mL of the nerve block solution was injected without difficulty or complication. After waiting for at least 3 minutes, the ablation was performed. Once completed, the needle was removed intact. Radiofrequency lesioning (ablation):  Radiofrequency Generator: NeuroTherm [id number] Sensory Stimulation Parameters: 50 Hz was used to locate & identify the nerve, making sure that the needle was positioned such that there was no sensory stimulation below 0.3 V or above 0.7 V. Motor Stimulation Parameters: 2 Hz was used to evaluate the motor component. Care was taken not to lesion any nerves that demonstrated motor stimulation of the lower extremities at an output of less than 2.5 times that of the sensory threshold, or a maximum of 2.0 V. Lesioning Technique Parameters: Standard Radiofrequency settings. (Not bipolar or pulsed.) Temperature Settings: 80 degrees C Lesioning time: 60 seconds Intra-operative Compliance: Compliant Materials & Medications: Needle(s) (Electrode/Cannula) Type: Teflon-coated, curved tip, Radiofrequency needle(s) Gauge: 22G Length: 10cm Numbing solution: 0.2% PF-Ropivacaine + Triamcinolone (40 mg/mL) diluted to a final concentration of 4 mg of Triamcinolone/mL of Ropivacaine The unused portion of the solution was discarded in the proper designated containers.  Once the entire procedure was completed, the treated area was cleaned, making sure to leave some of the prepping solution back to take advantage of its long term bactericidal properties.    Illustration of the posterior view of the lumbar spine and the posterior neural structures. Laminae of L2 through S1 are labeled. DPRL5, dorsal primary ramus of L5; DPRS1, dorsal primary ramus of S1; DPR3, dorsal  primary ramus of L3; FJ, facet (zygapophyseal) joint L3-L4; I, inferior articular process of L4; LB1, lateral branch of dorsal primary ramus of L1; IAB, inferior articular branches from L3 medial branch (supplies L4-L5 facet joint); IBP, intermediate branch plexus; MB3, medial branch of dorsal primary ramus of L3; NR3, third lumbar nerve root; S, superior articular process of L5; SAB, superior articular branches from L4 (supplies L4-5 facet joint also); TP3, transverse process of L3.  Vitals:   03/11/18 1218 03/11/18 1235 03/11/18 1245 03/11/18 1302  BP: 137/74 123/70 (!) 147/45 (!) 148/72  Pulse:      Resp: 17 16 20 11   Temp:  98 F (36.7 C)    TempSrc:  Temporal    SpO2: 98% 100% 99% 99%  Weight:      Height:        Start Time: 1139 hrs. End Time: 1216 hrs.  Imaging Guidance (Spinal):  Type of Imaging Technique: Fluoroscopy Guidance (Spinal) Indication(s): Assistance in needle guidance and placement for procedures requiring needle placement in or near specific anatomical locations not easily accessible without such assistance. Exposure Time: Please see nurses notes. Contrast: None used. Fluoroscopic Guidance: I was personally present during the use of fluoroscopy. "Tunnel Vision Technique" used to obtain the best possible view of the target area. Parallax error corrected before commencing the procedure. "Direction-depth-direction" technique used to introduce the needle under continuous pulsed fluoroscopy. Once target was reached, antero-posterior, oblique, and lateral fluoroscopic projection used confirm needle placement in all planes. Images permanently stored in EMR. Interpretation: No contrast injected. I personally interpreted the imaging intraoperatively. Adequate needle placement confirmed in multiple planes. Permanent images saved into the patient's record.  Antibiotic Prophylaxis:   Anti-infectives (From admission, onward)   None     Indication(s): None  identified  Post-operative Assessment:  Post-procedure Vital Signs:  Pulse Rate: 70 Temp: 98 F (36.7 C) Resp: 11 BP: (!) 148/72 SpO2: 99 %  EBL: None  Complications: No immediate post-treatment complications observed by team, or reported by patient.  Note: The patient tolerated the entire procedure well. A repeat set of vitals were taken after the procedure and the patient was kept under observation following institutional policy,  for this type of procedure. Post-procedural neurological assessment was performed, showing return to baseline, prior to discharge. The patient was provided with post-procedure discharge instructions, including a section on how to identify potential problems. Should any problems arise concerning this procedure, the patient was given instructions to immediately contact us, at any time, without hesitation. In any case, we plan to contact the patient by telephone for a follow-up status report regarding this interventional procedure.  Comments:  No additional relevant information.  Plan of Care    Imaging Orders     DG C-Arm 1-60 Min-No Report  Procedure Orders     Radiofrequency,Lumbar     Radiofrequency Sacroiliac Joint     Lumbar Epidural Injection     Lumbar Transforaminal Epidural  Medications ordered for procedure: Meds ordered this encounter  Medications  . lidocaine (XYLOCAINE) 2 % (with pres) injection 400 mg  . midazolam (VERSED) 5 MG/5ML injection 1-2 mg    Make sure Flumazenil is available in the pyxis when using this medication. If oversedation occurs, administer 0.2 mg IV over 15 sec. If after 45 sec no response, administer 0.2 mg again over 1 min; may repeat at 1 min intervals; not to exceed 4 doses (1 mg)  . fentaNYL (SUBLIMAZE) injection 25-50 mcg    Make sure Narcan is available in the pyxis when using this medication. In the event of respiratory depression (RR< 8/min): Titrate NARCAN (naloxone) in increments of 0.1 to 0.2 mg IV at 2-3  minute intervals, until desired degree of reversal.  . lactated ringers infusion 1,000 mL  . ropivacaine (PF) 2 mg/mL (0.2%) (NAROPIN) injection 9 mL  . triamcinolone acetonide (KENALOG-40) injection 40 mg  . ropivacaine (PF) 2 mg/mL (0.2%) (NAROPIN) injection 9 mL  . triamcinolone acetonide (KENALOG-40) injection 40 mg  . HYDROcodone-acetaminophen (NORCO/VICODIN) 5-325 MG tablet    Sig: Take 1 tablet by mouth every 6 (six) hours as needed for moderate pain.    Dispense:  60 tablet    Refill:  0    Fill one day early if pharmacy is closed on scheduled refill date. Do not fill until: 03/11/18 To last until: 04/10/18   Medications administered: We administered lidocaine, midazolam, fentaNYL, lactated ringers, ropivacaine (PF) 2 mg/mL (0.2%), triamcinolone acetonide, ropivacaine (PF) 2 mg/mL (0.2%), and triamcinolone acetonide.  See the medical record for exact dosing, route, and time of administration.  New Prescriptions   No medications on file   Disposition: Discharge home  Discharge Date & Time: 03/11/2018; 1313 hrs.   Physician-requested Follow-up: Return for Procedure (w/ sedation): (L) L1-2 LESI + (L) L2 L-TFESI.  Future Appointments  Date Time Provider [place]  03/19/2018  3:00 PM [facility]  03/23/2018 11:15 AM Cleophus Molt E, PTA [facility] None  03/24/2018  2:00 PM McGowan, Hunt Oris, PA-C [facility] None  03/30/2018 11:15 AM Cleophus Molt E, PTA [facility] None  03/30/2018  1:15 PM Milinda Pointer, MD [facility] None  04/06/2018 11:00 AM Hillis Range, PT [facility] None   Primary Care Physician: Idelle Crouch, MD Location: [facility] Note by: Gaspar Cola, MD Date: 03/11/2018; Time: 1:56 PM  Disclaimer:  Medicine is not an Chief Strategy Officer. The only guarantee in medicine is that nothing is guaranteed. It is important to note that the decision to proceed with this intervention was based on the information  collected from the patient. The Data and conclusions were drawn from the patient's questionnaire, the interview, and the physical examination. Because the  information was provided in large part by the patient, it cannot be guaranteed that it has not been purposely or unconsciously manipulated. Every effort has been made to obtain as much relevant data as possible for this evaluation. It is important to note that the conclusions that lead to this procedure are derived in large part from the available data. Always take into account that the treatment will also be dependent on availability of resources and existing treatment guidelines, considered by other Pain Management Practitioners as being common knowledge and practice, at the time of the intervention. For Medico-Legal purposes, it is also important to point out that variation in procedural techniques and pharmacological choices are the acceptable norm. The indications, contraindications, technique, and results of the above procedure should only be interpreted and judged by a Board-Certified Interventional Pain Specialist with extensive familiarity and expertise in the same exact procedure and technique.

## 2018-03-12 ENCOUNTER — Telehealth: Payer: Self-pay | Admitting: *Deleted

## 2018-03-19 ENCOUNTER — Ambulatory Visit
Admission: RE | Admit: 2018-03-19 | Discharge: 2018-03-19 | Disposition: A | Discharge status: Home / Self Care | Payer: Medicare Other | Source: Ambulatory Visit | Attending: Internal Medicine | Admitting: Internal Medicine

## 2018-03-19 DIAGNOSIS — I7389 Other specified peripheral vascular diseases: Secondary | ICD-10-CM | POA: Diagnosis not present

## 2018-03-19 DIAGNOSIS — R270 Ataxia, unspecified: Secondary | ICD-10-CM | POA: Diagnosis not present

## 2018-03-23 ENCOUNTER — Other Ambulatory Visit: Payer: Self-pay

## 2018-03-23 ENCOUNTER — Ambulatory Visit: Payer: Medicare Other | Attending: Pain Medicine

## 2018-03-23 DIAGNOSIS — R278 Other lack of coordination: Secondary | ICD-10-CM | POA: Insufficient documentation

## 2018-03-23 DIAGNOSIS — M6281 Muscle weakness (generalized): Secondary | ICD-10-CM | POA: Insufficient documentation

## 2018-03-23 DIAGNOSIS — R2689 Other abnormalities of gait and mobility: Secondary | ICD-10-CM | POA: Insufficient documentation

## 2018-03-23 NOTE — Progress Notes (Signed)
03/24/2018 2:37 PM   Elaine Clayton 19-Nov-1946 [id number]  Referring provider: Idelle Crouch, MD [facility] [place], [place] [address]  Chief Complaint  Patient presents with  . Urinary Incontinence    HPI: 72 yo WF with urinary urgency and frequency presents today for a three month follow up after starting Myrbetriq 25 mg qod.    Background history 72 year old female who returns today to discuss urinary urgency and frequency.  She has developed urgency, urge incontinence, and enuresis (about one per week) over the past year.  She does have a smoking history, quit 15 year ago.  1ppd x 30 years.  No gross hematuria.  No dysuria.   She was last seen in our office in 01/2015 at which time she was evaluated for recurrent urinary tract infections. She noted an increased frequency of infections after coming off of her estrogen patch (HRT).  She has since resumed this medication and not had a UTI since that time.   She has a personal history of breast cancer status post lumpectomy and 32.  She is drinking decaf green tea though the day along with several cups of coffee.  She does struggle with chronic constipation.   She has had issues with her gait and is being followed a neurologist.  Brain MRI negative.    Cystoscopy performed on 09/23/2017 noted mild urethral stricture.  Cytology was negative.  At her visit on 12/24/2017, she was experiencing urgency x 4-7, frequency-7, is restricting fluids to avoid visits to the restroom, is engaging in toilet mapping, incontinence x 0-3 and nocturia x 0-3.  Her PVR is 29 mL.  Her BP is 143/77.   She has not had fevers, chills, nausea or vomiting.  She has not had suprapubic pain, gross hematuria or dysuria.   Her major complaints are urgency, nocturia and intermittency.  She felt that she was starting to have difficulty with urination taking the Myrbetriq daily, so at this appointment we decided to take the Myrbetriq every other  day to see if that would help with her urinary symptoms.  She is taking her Myrbetriq 25 mg qod.   Today, she is experiencing urgency x 4-7 (unchanged), frequency x 4-7 (unchanged), not restricting fluids to avoid visits to the restroom, is engaging in toilet mapping, incontinence x 0-3 (unchanged) and nocturia x 0-3 (unchanged).  Her PVR is 124 ml.  Her BP is 114/63.    PMH: Past Medical History:  Diagnosis Date  . Anemia   . Ataxia   . Bipolar affective disorder ([place])    2  . Broken foot 2015   right foot  . Chronic gastritis   . Chronic kidney disease    Dr Holley Raring  . COPD (chronic obstructive pulmonary disease) ([place])   . Dizziness    inner ear (per pt) several times per week  . Dysphagia   . Glaucoma   . Hyperlipemia   . Hypertension    pt has recently come off BP meds.  MD aware. BP seems stable.  . Lithium toxicity 2015  . 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 Neck stiffness    s/p C3-C4 fusion, limited right turn  . Osteoarthritis    back  . Pancreatitis   . Parkinson disease ([place])   . Personal history of malignant neoplasm of breast   . Pituitary microadenoma ([place])   . Recurrent  UTI   . Renal insufficiency   . Stomach ulcer 2112    Surgical History: Past Surgical History:  Procedure Laterality Date  . APPENDECTOMY    . BREAST BIOPSY Right 2011   neg  . BREAST EXCISIONAL BIOPSY Right 2001   neg  . BREAST EXCISIONAL BIOPSY Right 2008   breast ca 2008 radiation  . BREAST EXCISIONAL BIOPSY Left 10/26/2012   neg  . BREAST SURGERY Left  2013   left breast wide excision,Intraductal papilloma, ductal hyperplasia and sclerosing adenosis. Microcalcifications associated with columnar cell change. No evidence of atypia or malignancy. Margins are unremarkable.  Marland Kitchen BREAST SURGERY Right November 06, 1999   multiple areas of microcalcification showing evidence of sclerosing adenosis and ductal  hyperplasia.  Marland Kitchen BREAST SURGERY Left May 07, 2007   wide excision.  . cataract Right 2013  . CATARACT EXTRACTION W/PHACO Left 03/05/2016   Procedure: CATARACT EXTRACTION PHACO AND INTRAOCULAR LENS PLACEMENT (IOC);  Surgeon: Leandrew Koyanagi, MD;  Location: [facility];  Service: Ophthalmology;  Laterality: Left;  . COLONOSCOPY  2004  . COLONOSCOPY N/A 05/14/2015   Procedure: COLONOSCOPY;  Surgeon: Manya Silvas, MD;  Location: [facility] ENDOSCOPY;  Service: Endoscopy;  Laterality: N/A;  . ERCP N/A   . ESOPHAGOGASTRODUODENOSCOPY N/A 05/14/2015   Procedure: ESOPHAGOGASTRODUODENOSCOPY (EGD);  Surgeon: Manya Silvas, MD;  Location[facility] ENDOSCOPY;  Service: Endoscopy;  Laterality: N/A;  . EUS N/A 2011, 2012  . EYE SURGERY  2013  . GLAUCOMA SURGERY Right 2013  . LAPAROSCOPIC RIGHT COLECTOMY Right 06/22/2015   Procedure: LAPAROSCOPIC RIGHT COLECTOMY;  Surgeon: Robert Bellow, MD;  Location: [facility] ORS;  Service: General;  Laterality: Right;  . NECK SURGERY  2006  . right breast cancer    . SAVORY DILATION N/A 05/14/2015   Procedure: SAVORY DILATION;  Surgeon: Manya Silvas, MD;  Location: [facility] ENDOSCOPY;  Service: Endoscopy;  Laterality: N/A;  . TRABECULECTOMY Left 03/05/2016   Procedure: TRABECULECTOMY WITH [facility] AND [facility] SHUNT;  Surgeon: Leandrew Koyanagi, MD;  Location: [place];  Service: Ophthalmology;  Laterality: Left;  . TUBAL LIGATION      Home Medications:  Allergies as of 03/24/2018      Reactions   Influenza Vaccines Anaphylaxis   Flu Virus Vaccine Other (See Comments)   Muscle spams   Sulfa Antibiotics Rash   Tetracyclines & Related Rash      Medication List        Accurate as of 03/24/18  2:37 PM. Always use your most recent med list.          ALPRAZolam 0.5 MG tablet Commonly known as:  XANAX Take 0.5 mg by mouth 2 (two) times daily as needed for sleep. 0.5-1 tablet   AZOPT 1 % ophthalmic suspension Generic drug:  brinzolamide Place 1 drop  into the left eye.   bisacodyl 5 MG EC tablet Commonly known as:  DULCOLAX Take 5 mg by mouth daily as needed for moderate constipation.   buPROPion 150 MG 12 hr tablet Commonly known as:  WELLBUTRIN SR Take 150 mg by mouth every morning. 2 tablets   COMBIGAN OP Apply 1 drop to eye 2 (two) times daily.   dextromethorphan 15 MG/5ML syrup Take 10 mLs by mouth 4 (four) times daily as needed for cough.   escitalopram 10 MG tablet Commonly known as:  LEXAPRO Take 10 mg by mouth daily.   estradiol-levonorgestrel 0.045-0.015 MG/DAY Commonly known as:  CLIMARA PRO apply 1 patch every week  fluticasone furoate-vilanterol 100-25 MCG/INH Aepb Commonly known as:  BREO ELLIPTA Inhale 1 puff into the lungs daily.   gabapentin 100 MG capsule Commonly known as:  NEURONTIN Take 1-3 capsules (100-300 mg total) by mouth at bedtime.   HYDROcodone-acetaminophen 5-325 MG tablet Commonly known as:  NORCO/VICODIN Take 1 tablet by mouth every 6 (six) hours as needed for moderate pain.   ipratropium 0.06 % nasal spray Commonly known as:  ATROVENT Place 2 sprays into both nostrils 4 (four) times daily as needed for rhinitis.   losartan 25 MG tablet Commonly known as:  COZAAR Take 25 mg by mouth daily.   LUMIGAN 0.01 % Soln Generic drug:  bimatoprost apply 1 drop to into both  eyes at bedtime once daily ONLY   mirabegron ER 25 MG Tb24 tablet Commonly known as:  MYRBETRIQ Take 1 tablet (25 mg total) by mouth daily.   nicotine polacrilex 2 MG gum Commonly known as:  NICORETTE Take 2 mg by mouth as needed for smoking cessation.   ondansetron 8 MG tablet Commonly known as:  ZOFRAN Take by mouth every 8 (eight) hours as needed for nausea or vomiting.   rosuvastatin 10 MG tablet Commonly known as:  CRESTOR Take 10 mg by mouth at bedtime.   VENTOLIN HFA 108 (90 Base) MCG/ACT inhaler Generic drug:  albuterol inhale 2 puffs by mouth INTO THE LUNGS every 6 hours if needed for wheezing or  shortness of breath       Allergies:  Allergies  Allergen Reactions  . Influenza Vaccines Anaphylaxis  . Flu Virus Vaccine Other (See Comments)    Muscle spams  . Sulfa Antibiotics Rash  . Tetracyclines & Related Rash    Family History: Family History  Problem Relation Age of Onset  . Breast cancer Mother 69  . Kidney cancer Neg Hx   . Bladder Cancer Neg Hx     Social History:  reports that she quit smoking about 10 years ago. She has a 35.00 pack-year smoking history. She has never used smokeless tobacco. She reports that she drinks alcohol. She reports that she does not use drugs.  ROS: UROLOGY Frequent Urination?: No Hard to postpone urination?: Yes Burning/pain with urination?: No Get up at night to urinate?: Yes Leakage of urine?: No Urine stream starts and stops?: Yes Trouble starting stream?: No Do you have to strain to urinate?: No Blood in urine?: No Urinary tract infection?: No Sexually transmitted disease?: No Injury to kidneys or bladder?: No Painful intercourse?: No Weak stream?: No Currently pregnant?: No Vaginal bleeding?: No Last menstrual period?: n  Gastrointestinal Nausea?: No Vomiting?: No Indigestion/heartburn?: No Diarrhea?: No Constipation?: Yes  Constitutional Fever: No Night sweats?: No Weight loss?: No Fatigue?: Yes  Skin Skin rash/lesions?: No Itching?: Yes  Eyes Blurred vision?: Yes Double vision?: No  Ears/Nose/Throat Sore throat?: No Sinus problems?: No  Hematologic/Lymphatic Swollen glands?: No Easy bruising?: Yes  Cardiovascular Leg swelling?: No Chest pain?: No  Respiratory Cough?: No Shortness of breath?: Yes  Endocrine Excessive thirst?: No  Musculoskeletal Back pain?: Yes Joint pain?: No  Neurological Headaches?: No Dizziness?: Yes  Psychologic Depression?: Yes Anxiety?: Yes  Physical Exam: BP 114/63 (BP Location: Right Arm, Patient Position: Sitting, Cuff Size: Normal)   Pulse 85    Ht 5\' 2"  (1.575 m)   Wt 104 lb (47.2 kg)   BMI 19.02 kg/m   Constitutional: Well nourished. Alert and oriented, No acute distress. HEENT: [place] AT, moist mucus membranes. Trachea midline, no masses. Cardiovascular:  No clubbing, cyanosis, or edema. Respiratory: Normal respiratory effort, no increased work of breathing. Skin: No rashes, bruises or suspicious lesions. Neurologic: Grossly intact, no focal deficits, moving all 4 extremities. Psychiatric: Normal mood and affect.   Laboratory Data: Lab Results  Component Value Date   CREATININE 0.99 03/19/2017   I have reviewed the labs  Pertinent Imaging: Results for ZAYNE, MAROVICH (MRN [id number]) as of 03/24/2018 14:34  Ref. Range 03/24/2018 14:28  Scan Result Unknown 124     Assessment & Plan:   1. Urge incontinence BLADDER SCAN AMB NON-IMAGING She will take the Myrbetriq 25 mg qod RTC in one year for OAB questionnaire and PVR  2. Urinary frequency As above  3. Enuresis Improving  Return in about 1 year (around 03/25/2019) for PVR and OAB questionnaire.  Zara Council, PA-C  [facility] [address], [place],  [address] [phone]

## 2018-03-23 NOTE — Therapy (Signed)
[place] MAIN [facility] SERVICES [address] [place], [place], [address] Phone: [phone]   Fax:  [phone]  Physical Therapy Treatment  Patient Details  Name: Elaine Clayton MRN: [id number] Date of Birth: 1946/12/13 Referring Provider: Dr. Joanna Hews    Encounter Date: 03/23/2018  PT End of Session - 03/23/18 1938    Visit Number  32    Number of Visits  55    Date for PT Re-Evaluation  03/31/18    PT Start Time  1120    PT Stop Time  1215    PT Time Calculation (min)  55 min    Activity Tolerance  Patient tolerated treatment well;No increased pain    Behavior During Therapy  WFL for tasks assessed/performed       Past Medical History:  Diagnosis Date  . Anemia   . Ataxia   . Bipolar affective disorder ([place])    2  . Broken foot 2015   right foot  . Chronic gastritis   . Chronic kidney disease    Dr Holley Raring  . COPD (chronic obstructive pulmonary disease) ([place])   . Dizziness    inner ear (per pt) several times per week  . Dysphagia   . Glaucoma   . Hyperlipemia   . Hypertension    pt has recently come off BP meds.  MD aware. BP seems stable.  . Lithium toxicity 2015  . 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 Neck stiffness    s/p C3-C4 fusion, limited right turn  . Osteoarthritis    back  . Pancreatitis   . Parkinson disease ([place])   . Personal history of malignant neoplasm of breast   . Pituitary microadenoma ([place])   . Recurrent UTI   . Renal insufficiency   . Stomach ulcer 2112    Past Surgical History:  Procedure Laterality Date  . APPENDECTOMY    . BREAST BIOPSY Right 2011   neg  . BREAST EXCISIONAL BIOPSY Right 2001   neg  . BREAST EXCISIONAL BIOPSY Right 2008   breast ca 2008 radiation  . BREAST EXCISIONAL BIOPSY Left 10/26/2012   neg  . BREAST SURGERY Left  2013   left breast wide excision,Intraductal papilloma, ductal  hyperplasia and sclerosing adenosis. Microcalcifications associated with columnar cell change. No evidence of atypia or malignancy. Margins are unremarkable.  Marland Kitchen BREAST SURGERY Right November 06, 1999   multiple areas of microcalcification showing evidence of sclerosing adenosis and ductal hyperplasia.  Marland Kitchen BREAST SURGERY Left May 07, 2007   wide excision.  . cataract Right 2013  . CATARACT EXTRACTION W/PHACO Left 03/05/2016   Procedure: CATARACT EXTRACTION PHACO AND INTRAOCULAR LENS PLACEMENT (IOC);  Surgeon: Leandrew Koyanagi, MD;  Location: [place];  Service: Ophthalmology;  Laterality: Left;  . COLONOSCOPY  2004  . COLONOSCOPY N/A 05/14/2015   Procedure: COLONOSCOPY;  Surgeon: Manya Silvas, MD;  Location: [facility] ENDOSCOPY;  Service: Endoscopy;  Laterality: N/A;  . ERCP N/A   . ESOPHAGOGASTRODUODENOSCOPY N/A 05/14/2015   Procedure: ESOPHAGOGASTRODUODENOSCOPY (EGD);  Surgeon: Manya Silvas, MD;  Location: [facility] ENDOSCOPY;  Service: Endoscopy;  Laterality: N/A;  . EUS N/A 2011, 2012  . EYE SURGERY  2013  . GLAUCOMA SURGERY Right 2013  . LAPAROSCOPIC RIGHT COLECTOMY Right 06/22/2015   Procedure: LAPAROSCOPIC RIGHT COLECTOMY;  Surgeon: Robert Bellow, MD;  Location: [facility] ORS;  Service: General;  Laterality:  Right;  Marland Kitchen NECK SURGERY  2006  . right breast cancer    . SAVORY DILATION N/A 05/14/2015   Procedure: SAVORY DILATION;  Surgeon: Manya Silvas, MD;  Location: [facility] ENDOSCOPY;  Service: Endoscopy;  Laterality: N/A;  . TRABECULECTOMY Left 03/05/2016   Procedure: TRABECULECTOMY WITH [facility] AND [facility] SHUNT;  Surgeon: Leandrew Koyanagi, MD;  Location: [place];  Service: Ophthalmology;  Laterality: Left;  . TUBAL LIGATION      There were no vitals filed for this visit.  Subjective Assessment - 03/23/18 1935    Subjective  Reports difficulty with balance today with several stumbles into the wall this morning, but no actual falls/no injury.     Pertinent History   72 yo Female with recent increase in back pain with subsequent numbness/tingling , weakness, and drop foot in B LEs, L>R starting about 3 months ago. She has had increased instability in gait and falls recently with minor injuries. Pt had recent epidural steroid injection L4-5, and L5-S1 with decrease in back pain since.Pt with extensive surgical and medical Hx including breast cancer (last surgery 2013), DJD with cervical fusion C3-5 in 2006, bipolar disorder, HTN, OA in bilateral shoulders L>R, hip and SIJ pain x >20 years.       Enters/exits via ramp Participates in the following  Ambulation, blue dumbbells for support  4 L fwd  4 L side  Side step with squat 2 L requiring increased assist. Difficulty today  Stepping lunges, 25 x ea  side, B  Bench, core/LE, 4 min ea  Bike  Flutter  Scissor  STS from bench, 20x  Suspended position  Ski, 1 min  Jacks, 1 min  Elevated position  Alternating ham curl, 30 sec 2 rounds          Alternated with:  Alternating high knee, 30 sec 2 rounds  Step  alternating toe taps   1 min with 1 UE light touch   1 min no UE  Ipsilateral step up, R/L 20x ea  Walking cool down                            PT Education - 03/23/18 1936    Education provided  Yes    Education Details  continued work on core stabilization, posture and technique/form with exercise for greatest benefit. Educated in suspended position for ability to perform various movements pt likes to do in the water, but cannot tolerate in a rebound position.        PT Short Term Goals - 02/22/18 1308      PT SHORT TERM GOAL #1   Title  Patient will deny any falls over past 4 weeks to demonstrate improved safety awareness at home and work.     Baseline  has multiple near misses; fell in [employer] about a week ago; 3/4: pt reports ~10 falls in the past 4 weeks    Time  4    Period  Weeks    Status  Not Met      PT SHORT TERM GOAL #2   Title  Pt  will obtain and appropriately use LRAD (Rollator suggested) in order to decraese fall risk and increase community access.     Baseline  Pt has obtained Rollator for use outside    Time  2    Period  Weeks    Status  Achieved        PT Long  Term Goals - 02/22/18 1309      PT LONG TERM GOAL #1   Title  Patient will be independent in home exercise program to improve strength/mobility for better functional independence with ADLs;     Baseline  Pt does part of her HEP about 3-4 times per week; 3/4: pt completing HEP 1/wk in addition to her therapy    Time  4    Period  Weeks    Status  Partially Met      PT LONG TERM GOAL #2   Title  Patient will increase Berg Balance score by > 6 points to demonstrate decreased fall risk during functional activities.; Deferred as patient's balance is more impaired dynamically and therefore FGA goal more appropriate;     Baseline  will defer at this time; 3/4: 44/56    Time  4    Period  Weeks    Status  Partially Met      PT LONG TERM GOAL #3   Title  Patient (> 24 years old) will complete five times sit to stand test in < 15 seconds indicating an increased LE strength and improved balance.    Baseline  15 sec without HHA;    Time  4    Period  Weeks    Status  Achieved      PT LONG TERM GOAL #4   Title  Patient will ascend/descend 4 stairs without rail assist independently without loss of balance to improve ability to get in/out of home.     Baseline  able to negotiate reciprocally but requires rail assist; 3:4: able to negotiate recriprocally but requires railing use for descending steps    Time  4    Period  Weeks    Status  Partially Met      PT LONG TERM GOAL #5   Title  Patient will increase BLE gross strength to 4/5 as to improve functional strength for independent gait, increased standing tolerance and increased ADL ability.    Baseline  ankle 3+/5    Time  4    Period  Weeks    Status  Partially Met      PT LONG TERM GOAL #6    Title  Patient will increase Functional Gait Assessment score to >20/30 as to reduce fall risk and improve dynamic gait safety with community ambulation.    Baseline  20/30    Time  4    Period  Weeks    Status  Partially Met            Plan - 03/23/18 1939    Clinical Impression Statement  Pt has difficulty maintaining balance at times often leaning forward; also demonstrates difficulty with coordinated movements in travel due to difficulty stopping travel in progress in order to perform a stationary exercise. Performs with improved technique with dynamic movement in a more stationary position with stepping, but not travel. Discussed plan for HEP to include circuits of strength with cardio intervals in between. Plan to practice program next session.      Rehab Potential  Fair    Clinical Impairments Affecting Rehab Potential  Pt does not use AD and had decreased knowledge of her limitations    PT Frequency  1x / week    PT Duration  6 weeks    PT Treatment/Interventions  ADLs/Self Care Home Management;Aquatic Therapy;Biofeedback;Canalith Repostioning;Cryotherapy;Moist Heat;Cognitive remediation;Neuromuscular re-education;Balance training;Therapeutic exercise;Therapeutic activities;Functional mobility training;Stair training;Gait training;DME Instruction;Patient/family education;Orthotic Fit/Training;Manual techniques;Passive range of motion;Visual/perceptual remediation/compensation;Vestibular;Energy conservation;Dry  needling    PT Home Exercise Plan  continue as given;     Consulted and Agree with Plan of Care  Patient       Patient will benefit from skilled therapeutic intervention in order to improve the following deficits and impairments:  Abnormal gait, Decreased activity tolerance, Decreased balance, Decreased cognition, Decreased coordination, Decreased safety awareness, Decreased mobility, Decreased knowledge of use of DME, Decreased knowledge of precautions, Decreased endurance,  Decreased skin integrity, Decreased strength, Difficulty walking, Dizziness, Postural dysfunction, Improper body mechanics, Impaired vision/preception, Impaired UE functional use, Impaired sensation, Hypomobility, Decreased range of motion, Impaired flexibility, Pain  Visit Diagnosis: Other lack of coordination  Other abnormalities of gait and mobility  Muscle weakness (generalized)     Problem List Patient Active Problem List   Diagnosis Date Noted  . Spondylosis without myelopathy or radiculopathy, lumbosacral region 03/11/2018  . Other specified dorsopathies, sacral and sacrococcygeal region 03/11/2018  . Ataxic gait 11/20/2017  . Gait instability 11/20/2017  . 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 Cognitive decline 11/20/2017  . PBA (pseudobulbar affect) 11/20/2017  . Truncal ataxia 11/20/2017  . Lumbar facet syndrome (Bilateral) (L>R) 07/29/2017  . Chronic sacroiliac joint pain (Bilateral) (L>R) 07/29/2017  . Chronic hip pain (Bilateral) (L>R) 07/29/2017  . Chronic lower extremity pain (Secondary Area of Pain) (Bilateral) (L>R) 07/29/2017  . Impairment of balance 07/07/2017  . Menopausal and female climacteric states 06/15/2017  . Lumbar foraminal stenosis (T12-S1, multilevel) 06/10/2017  . Lumbar lateral recess stenosis (left L2-3, bilateral L3-4, & right L4-5) 06/10/2017  . Grade 1 Anterolisthesis of L4 over L5 and L5 over S1 06/10/2017  . Lumbar facet hypertrophy (multilevel) 06/10/2017  . Lumbar Ligamentum flavum hypertrophy (HCC) (multilevel) 06/10/2017  . Dropfoot (Left) (L5 radiculopathy) 06/10/2017  . Neurogenic pain 06/09/2017  . DDD (degenerative disc disease), lumbar 06/09/2017  . 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 Closed fracture of metatarsal bone 05/06/2017  . 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 Abnormal MRI, cervical spine 05/06/2017  .  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[place]) 05/06/2017  . Cervical radiculitis 05/06/2017  . Chronic lumbar radiculopathy (Bilateral) (L>R) (left L5) 05/06/2017  . Chronic low back pain (Primary Area of Pain) (Bilateral) (L>R) 05/06/2017  . Abnormal MRI, lumbar spine 05/06/2017  . Chronic pain syndrome 03/19/2017  . Chronic shoulder pain (Fourth Area of Pain) (Bilateral) (L>R) 03/19/2017  . Osteoarthritis of shoulder (Bilateral) 03/19/2017  . Chronic neck pain [facility] Area of Pain) (Bilateral) (L>R) 03/19/2017  . History of C3-5 ACDF 03/19/2017  . Numbness and tingling in left upper extremity 03/19/2017  . Numbness and tingling of right upper extremity 03/19/2017  . Upper extremity weakness (Bilateral) (L>R) 03/19/2017  . Numbness of upper extremity (Bilateral) (L>R) 03/19/2017  . Numbness and tingling of lower extremity (Bilateral) (L>R) 03/19/2017  . Weakness of lower extremity (Bilateral) (R>L) 03/19/2017  . Chronic abdominal pain (epigastric) 03/19/2017  . Pituitary microadenoma ([place]) 12/08/2016  . History of breast cancer in adulthood 12/08/2016  . Dysmetria 12/08/2016  . Loss of weight 08/15/2015  . Colon polyp 05/25/2015  . Personal history of malignant neoplasm of breast 07/18/2014  . Breast cancer ([place]) 06/28/2014  . Bipolar disorder ([place]) 06/28/2014  . Chronic pancreatitis ([place]) 06/28/2014  . HTN (hypertension) 06/28/2014  . OA (osteoarthritis) 06/28/2014  . Renal insufficiency 06/28/2014  . DDD (degenerative disc disease), cervical 06/28/2014  . Hyperlipidemia 06/28/2014  . COPD (chronic obstructive pulmonary  disease) ([place]) 05/17/2014  . Breast cancer of upper-outer quadrant of right female breast ([place]) 05/31/2008    Larae Grooms 03/23/2018, 7:45 PM  [place] MAIN [facility] SERVICES [address] [place], [place], [address] Phone: [phone]   Fax:  [phone]  Name: KARLISSA ARON MRN: [id number] Date of Birth:  1946-05-02

## 2018-03-24 ENCOUNTER — Ambulatory Visit: Payer: Medicare Other | Admitting: Urology

## 2018-03-24 ENCOUNTER — Encounter: Payer: Self-pay | Admitting: Urology

## 2018-03-24 VITALS — BP 114/63 | HR 85 | Ht 62.0 in | Wt 104.0 lb

## 2018-03-24 DIAGNOSIS — R350 Frequency of micturition: Secondary | ICD-10-CM

## 2018-03-24 DIAGNOSIS — N3941 Urge incontinence: Secondary | ICD-10-CM | POA: Diagnosis not present

## 2018-03-24 DIAGNOSIS — R32 Unspecified urinary incontinence: Secondary | ICD-10-CM

## 2018-03-24 LAB — BLADDER SCAN AMB NON-IMAGING: Scan Result: 124

## 2018-03-24 MED ORDER — MIRABEGRON ER 25 MG PO TB24: 25.0000 mg | ORAL_TABLET | Freq: Every day | ORAL | 3 refills | Status: DC

## 2018-03-30 ENCOUNTER — Other Ambulatory Visit: Payer: Self-pay

## 2018-03-30 ENCOUNTER — Ambulatory Visit
Admission: RE | Admit: 2018-03-30 | Discharge: 2018-03-30 | Disposition: A | Discharge status: Home / Self Care | Payer: Medicare Other | Source: Ambulatory Visit | Attending: Pain Medicine | Admitting: Pain Medicine

## 2018-03-30 ENCOUNTER — Ambulatory Visit (HOSPITAL_BASED_OUTPATIENT_CLINIC_OR_DEPARTMENT_OTHER): Payer: Medicare Other | Admitting: Pain Medicine

## 2018-03-30 ENCOUNTER — Ambulatory Visit: Payer: Medicare Other

## 2018-03-30 ENCOUNTER — Encounter: Payer: Self-pay | Admitting: Pain Medicine

## 2018-03-30 VITALS — BP 177/78 | HR 84 | Temp 98.2°F | Resp 23 | Ht 64.0 in | Wt 102.0 lb

## 2018-03-30 DIAGNOSIS — Z888 Allergy status to other drugs, medicaments and biological substances status: Secondary | ICD-10-CM | POA: Insufficient documentation

## 2018-03-30 DIAGNOSIS — M5416 Radiculopathy, lumbar region: Secondary | ICD-10-CM | POA: Diagnosis present

## 2018-03-30 DIAGNOSIS — M48062 Spinal stenosis, lumbar region with neurogenic claudication: Secondary | ICD-10-CM | POA: Insufficient documentation

## 2018-03-30 DIAGNOSIS — M5116 Intervertebral disc disorders with radiculopathy, lumbar region: Secondary | ICD-10-CM | POA: Diagnosis not present

## 2018-03-30 DIAGNOSIS — H269 Unspecified cataract: Secondary | ICD-10-CM | POA: Insufficient documentation

## 2018-03-30 DIAGNOSIS — M5136 Other intervertebral disc degeneration, lumbar region: Secondary | ICD-10-CM | POA: Diagnosis not present

## 2018-03-30 DIAGNOSIS — Z9049 Acquired absence of other specified parts of digestive tract: Secondary | ICD-10-CM | POA: Insufficient documentation

## 2018-03-30 DIAGNOSIS — M9983 Other biomechanical lesions of lumbar region: Secondary | ICD-10-CM | POA: Diagnosis not present

## 2018-03-30 DIAGNOSIS — M48061 Spinal stenosis, lumbar region without neurogenic claudication: Secondary | ICD-10-CM | POA: Insufficient documentation

## 2018-03-30 DIAGNOSIS — Z79899 Other long term (current) drug therapy: Secondary | ICD-10-CM | POA: Insufficient documentation

## 2018-03-30 DIAGNOSIS — M545 Low back pain: Secondary | ICD-10-CM | POA: Diagnosis present

## 2018-03-30 DIAGNOSIS — Z853 Personal history of malignant neoplasm of breast: Secondary | ICD-10-CM | POA: Diagnosis not present

## 2018-03-30 MED ORDER — LACTATED RINGERS IV SOLN
1000.0000 mL | Freq: Once | INTRAVENOUS | Status: AC
  Administered 2018-03-30: 1000 mL via INTRAVENOUS

## 2018-03-30 MED ORDER — TRIAMCINOLONE ACETONIDE 40 MG/ML IJ SUSP
40.0000 mg | Freq: Once | INTRAMUSCULAR | Status: AC
  Administered 2018-03-30: 40 mg
  Filled 2018-03-30: qty 1

## 2018-03-30 MED ORDER — SODIUM CHLORIDE 0.9% FLUSH
2.0000 mL | Freq: Once | INTRAVENOUS | Status: AC
  Administered 2018-03-30: 10 mL

## 2018-03-30 MED ORDER — ROPIVACAINE HCL 2 MG/ML IJ SOLN
1.0000 mL | Freq: Once | INTRAMUSCULAR | Status: AC
  Administered 2018-03-30: 10 mL via EPIDURAL
  Filled 2018-03-30: qty 10

## 2018-03-30 MED ORDER — FENTANYL CITRATE (PF) 100 MCG/2ML IJ SOLN
25.0000 ug | INTRAMUSCULAR | Status: DC | PRN
  Administered 2018-03-30: 75 ug via INTRAVENOUS
  Filled 2018-03-30: qty 2

## 2018-03-30 MED ORDER — SODIUM CHLORIDE 0.9% FLUSH
1.0000 mL | Freq: Once | INTRAVENOUS | Status: AC
  Administered 2018-03-30: 10 mL

## 2018-03-30 MED ORDER — MIDAZOLAM HCL 5 MG/5ML IJ SOLN
1.0000 mg | INTRAMUSCULAR | Status: DC | PRN
  Administered 2018-03-30: 1.5 mg via INTRAVENOUS
  Filled 2018-03-30: qty 5

## 2018-03-30 MED ORDER — ROPIVACAINE HCL 2 MG/ML IJ SOLN
2.0000 mL | Freq: Once | INTRAMUSCULAR | Status: AC
  Administered 2018-03-30: 10 mL via EPIDURAL
  Filled 2018-03-30: qty 10

## 2018-03-30 MED ORDER — DEXAMETHASONE SODIUM PHOSPHATE 10 MG/ML IJ SOLN
10.0000 mg | Freq: Once | INTRAMUSCULAR | Status: AC
  Administered 2018-03-30: 10 mg
  Filled 2018-03-30: qty 1

## 2018-03-30 MED ORDER — LIDOCAINE HCL 2 % IJ SOLN
20.0000 mL | Freq: Once | INTRAMUSCULAR | Status: AC
  Administered 2018-03-30: 400 mg
  Filled 2018-03-30: qty 20

## 2018-03-30 MED ORDER — IOPAMIDOL (ISOVUE-M 200) INJECTION 41%
10.0000 mL | Freq: Once | INTRAMUSCULAR | Status: AC
  Administered 2018-03-30: 10 mL via EPIDURAL
  Filled 2018-03-30: qty 10

## 2018-03-30 NOTE — Progress Notes (Signed)
Patient's Name: Elaine Clayton  MRN: [id number]  Referring Provider: Milinda Pointer, MD  DOB: 09-Oct-1946  PCP: Idelle Crouch, MD  DOS: 03/30/2018  Note by: Gaspar Cola, MD  Service setting: Ambulatory outpatient  Specialty: Interventional Pain Management  Patient type: Established  Location: [facility] (AMB) Pain Management Facility  Visit type: Interventional Procedure   Primary Reason for Visit: Interventional Pain Management Treatment. CC: Back Pain (lower)  Procedure:  Anesthesia, Analgesia, Anxiolysis:  Procedure #1: Type: Diagnostic Inter-Laminar Epidural Steroid Injection  #1  Region: Lumbar Level: L1-2 Level. Laterality: Left Paramedial  Procedure #2: Type: Diagnostic Trans-Foraminal Epidural Steroid Injection  #1  Region: Lumbar Level: L2 Paravertebral Laterality: Left Paravertebral Position: Prone  Type: Moderate (Conscious) Sedation combined with Local Anesthesia Indication(s): Analgesia and Anxiety Route: Intravenous (IV) IV Access: Secured Sedation: Meaningful verbal contact was maintained at all times during the procedure  Local Anesthetic: Lidocaine 1-2%   Indications: 1. DDD (degenerative disc disease), lumbar   2. Chronic lumbar radiculopathy (Bilateral) (L>R) (left L5)   3. 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 Lumbar lateral recess stenosis (left L2-3, bilateral L3-4, & right L4-5)   5. Lumbar foraminal stenosis (T12-S1, multilevel)    Pain Score: Pre-procedure: 6 /10 Post-procedure: 4 /10  Pre-op Assessment:  Elaine Clayton is a 72 y.o. (year old), female patient, seen today for interventional treatment. She  has a past surgical history that includes Colonoscopy (2004); Neck surgery (2006); Eye surgery (2013); Appendectomy; Tubal ligation; Colonoscopy (N/A, 05/14/2015); Esophagogastroduodenoscopy (N/A, 05/14/2015); Savory dilation (N/A, 05/14/2015); ERCP (N/A); Glaucoma surgery (Right, 2013); cataract (Right, 2013); Breast surgery (Left,   2013); Breast surgery (Right, November 06, 1999); Breast surgery (Left, May 07, 2007); EUS (N/A, 2011, 2012); Laparoscopic right colectomy (Right, 06/22/2015); right breast cancer; Trabeculectomy (Left, 03/05/2016); Cataract extraction w/PHACO (Left, 03/05/2016); Breast biopsy (Right, 2011); Breast excisional biopsy (Right, 2001); Breast excisional biopsy (Right, 2008); and Breast excisional biopsy (Left, 10/26/2012). Elaine Clayton has a current medication list which includes the following prescription(s): alprazolam, azopt, bisacodyl, brimonidine tartrate-timolol, bupropion, dextromethorphan, escitalopram, estradiol-levonorgestrel, fluticasone furoate-vilanterol, hydrocodone-acetaminophen, ipratropium, losartan, lumigan, mirabegron er, nicotine polacrilex, ondansetron, rosuvastatin, ventolin hfa, and gabapentin, and the following Facility-Administered Medications: fentanyl and midazolam. Her primarily concern today is the Back Pain (lower)  Initial Vital Signs:  Pulse Rate: 84 Temp: 98.2 F (36.8 C) Resp: 18 BP: (!) 157/84 SpO2: 100 %  BMI: Estimated body mass index is 17.51 kg/m as calculated from the following:   Height as of this encounter: 5\' 4"  (1.626 m).   Weight as of this encounter: 102 lb (46.3 kg).  Risk Assessment: Allergies: Reviewed. She is allergic to influenza vaccines; flu virus vaccine; gabapentin; sulfa antibiotics; and tetracyclines & related.  Allergy Precautions: None required Coagulopathies: Reviewed. None identified.  Blood-thinner therapy: None at this time Active Infection(s): Reviewed. None identified. Elaine Clayton is afebrile  Site Confirmation: Elaine Clayton was asked to confirm the procedure and laterality before marking the site Procedure checklist: Completed Consent: Before the procedure and under the influence of no sedative(s), amnesic(s), or anxiolytics, the patient was informed of the treatment options, risks and possible complications. To fulfill our ethical and  legal obligations, as recommended by the American Medical Association's Code of Ethics, I have informed the patient of my clinical impression; the nature and purpose of the treatment or procedure; the risks, benefits, and possible complications of the intervention; the alternatives, including doing nothing; the risk(s) and benefit(s) of the alternative treatment(s) or procedure(s); and the  risk(s) and benefit(s) of doing nothing. The patient was provided information about the general risks and possible complications associated with the procedure. These may include, but are not limited to: failure to achieve desired goals, infection, bleeding, organ or nerve damage, allergic reactions, paralysis, and death. In addition, the patient was informed of those risks and complications associated to Spine-related procedures, such as failure to decrease pain; infection (i.e.: Meningitis, epidural or intraspinal abscess); bleeding (i.e.: epidural hematoma, subarachnoid hemorrhage, or any other type of intraspinal or peri-dural bleeding); organ or nerve damage (i.e.: Any type of peripheral nerve, nerve root, or spinal cord injury) with subsequent damage to sensory, motor, and/or autonomic systems, resulting in permanent pain, numbness, and/or weakness of one or several areas of the body; allergic reactions; (i.e.: anaphylactic reaction); and/or death. Furthermore, the patient was informed of those risks and complications associated with the medications. These include, but are not limited to: allergic reactions (i.e.: anaphylactic or anaphylactoid reaction(s)); adrenal axis suppression; blood sugar elevation that in diabetics may result in ketoacidosis or comma; water retention that in patients with history of congestive heart failure may result in shortness of breath, pulmonary edema, and decompensation with resultant heart failure; weight gain; swelling or edema; medication-induced neural toxicity; particulate matter  embolism and blood vessel occlusion with resultant organ, and/or nervous system infarction; and/or aseptic necrosis of one or more joints. Finally, the patient was informed that Medicine is not an exact science; therefore, there is also the possibility of unforeseen or unpredictable risks and/or possible complications that may result in a catastrophic outcome. The patient indicated having understood very clearly. We have given the patient no guarantees and we have made no promises. Enough time was given to the patient to ask questions, all of which were answered to the patient's satisfaction. Ms. Deshpande has indicated that she wanted to continue with the procedure. Attestation: I, the ordering provider, attest that I have discussed with the patient the benefits, risks, side-effects, alternatives, likelihood of achieving goals, and potential problems during recovery for the procedure that I have provided informed consent. Date  Time: 03/30/2018 12:50 PM  Pre-Procedure Preparation:  Monitoring: As per clinic protocol. Respiration, ETCO2, SpO2, BP, heart rate and rhythm monitor placed and checked for adequate function Safety Precautions: Patient was assessed for positional comfort and pressure points before starting the procedure. Time-out: I initiated and conducted the "Time-out" before starting the procedure, as per protocol. The patient was asked to participate by confirming the accuracy of the "Time Out" information. Verification of the correct person, site, and procedure were performed and confirmed by me, the nursing staff, and the patient. "Time-out" conducted as per Joint Commission's Universal Protocol (UP.01.01.01). Time: 1407  Description of Procedure #1 Process:   Target Area: The  interlaminar space, initially targeting the lower border of the superior vertebral body lamina. Approach: Posterior paramedial approach. Area Prepped: Entire Posterior Lumbosacral Region Prepping solution:  ChloraPrep (2% chlorhexidine gluconate and 70% isopropyl alcohol) Safety Precautions: Aspiration looking for blood return was conducted prior to all injections. At no point did we inject any substances, as a needle was being advanced. No attempts were made at seeking any paresthesias. Safe injection practices and needle disposal techniques used. Medications properly checked for expiration dates. SDV (single dose vial) medications used. Description of the Procedure: Protocol guidelines were followed. The patient was placed in position over the fluoroscopy table. The target area was identified and the area prepped in the usual manner. Skin desensitized using vapocoolant spray. Skin & deeper  tissues infiltrated with local anesthetic. Appropriate amount of time allowed to pass for local anesthetics to take effect. The procedure needle was introduced through the skin, ipsilateral to the reported pain, and advanced to the target area. Bone was contacted and the needle walked caudad, until the lamina was cleared. The ligamentum flavum was engaged and loss-of-resistance technique used as the epidural needle was advanced. The epidural space was identified using "loss-of-resistance technique" with 2-3 ml of PF-NaCl (0.9% NSS), in a 5cc LOR glass syringe. Proper needle placement secured. Negative aspiration confirmed. Solution injected in intermittent fashion, asking for systemic symptoms every 0.5cc of injectate. The needles were then removed and the area cleansed, making sure to leave some of the prepping solution back to take advantage of its long term bactericidal properties. Start Time: 1407 hrs. Materials:  Needle(s) Type: Epidural needle Gauge: 17G Length: 3.5-in Medication(s): Please see orders for medications and dosing details.  Description of Procedure #2 Process:   Target Area: The inferior and lateral portion of the pedicle, just lateral to a line created by the 6:00 position of the pedicle and the  superior articular process of the vertebral body below. On the lateral view, this target lies just posterior to the anterior aspect of the lamina and posterior to the midpoint created between the anterior and the posterior aspect of the neural foramina. Approach: Posterior paravertebral approach. Area Prepped: Same as above Prepping solution: Same as above Safety Precautions: Same as above Description of the Procedure: Protocol guidelines were followed. The patient was placed in position over the fluoroscopy table. The target area was identified and the area prepped in the usual manner. Skin desensitized using vapocoolant spray. Skin & deeper tissues infiltrated with local anesthetic. Appropriate amount of time allowed to pass for local anesthetics to take effect. The procedure needles were then advanced to the target area. Proper needle placement secured. Negative aspiration confirmed. Solution injected in intermittent fashion, asking for systemic symptoms every 0.2cc of injectate. The needles were then removed and the area cleansed, making sure to leave some of the prepping solution back to take advantage of its long term bactericidal properties. Vitals:   03/30/18 1438 03/30/18 1449 03/30/18 1453 03/30/18 1459  BP: (!) 193/81 (!) 202/83 (!) 169/75 (!) 177/78  Pulse:      Resp: 12 (!) 34  (!) 23  Temp:      SpO2: 95% 99%  100%  Weight:      Height:        End Time: 1429 hrs. Materials:  Needle(s) Type: Regular needle Gauge: 22G Length: 3.5-in Medication(s): Please see orders for medications and dosing details.  Imaging Guidance (Spinal):  Type of Imaging Technique: Fluoroscopy Guidance (Spinal) Indication(s): Assistance in needle guidance and placement for procedures requiring needle placement in or near specific anatomical locations not easily accessible without such assistance. Exposure Time: Please see nurses notes. Contrast: Before injecting any contrast, we confirmed that the  patient did not have an allergy to iodine, shellfish, or radiological contrast. Once satisfactory needle placement was completed at the desired level, radiological contrast was injected. Contrast injected under live fluoroscopy. No contrast complications. See chart for type and volume of contrast used. Fluoroscopic Guidance: I was personally present during the use of fluoroscopy. "Tunnel Vision Technique" used to obtain the best possible view of the target area. Parallax error corrected before commencing the procedure. "Direction-depth-direction" technique used to introduce the needle under continuous pulsed fluoroscopy. Once target was reached, antero-posterior, oblique, and lateral fluoroscopic projection used  confirm needle placement in all planes. Images permanently stored in EMR. Interpretation: I personally interpreted the imaging intraoperatively. Adequate needle placement confirmed in multiple planes. Appropriate spread of contrast into desired area was observed. No evidence of afferent or efferent intravascular uptake. No intrathecal or subarachnoid spread observed. Permanent images saved into the patient's record.  Antibiotic Prophylaxis:   Anti-infectives (From admission, onward)   None     Indication(s): None identified  Post-operative Assessment:  Post-procedure Vital Signs:  Pulse Rate: 84 Temp: 98.2 F (36.8 C) Resp: (!) 23 BP: (!) 177/78 SpO2: 100 %  EBL: None  Complications: No immediate post-treatment complications observed by team, or reported by patient.  Note: The patient tolerated the entire procedure well. A repeat set of vitals were taken after the procedure and the patient was kept under observation following institutional policy, for this type of procedure. Post-procedural neurological assessment was performed, showing return to baseline, prior to discharge. The patient was provided with post-procedure discharge instructions, including a section on how to identify  potential problems. Should any problems arise concerning this procedure, the patient was given instructions to immediately contact us, at any time, without hesitation. In any case, we plan to contact the patient by telephone for a follow-up status report regarding this interventional procedure.  Comments:  No additional relevant information.  Plan of Care    Imaging Orders     DG C-Arm 1-60 Min-No Report  Procedure Orders     Lumbar Epidural Injection     Lumbar Transforaminal Epidural  Medications ordered for procedure: Meds ordered this encounter  Medications  . iopamidol (ISOVUE-M) 41 % intrathecal injection 10 mL    Must be Myelogram-compatible. If not available, you may substitute with a water-soluble, non-ionic, hypoallergenic, myelogram-compatible radiological contrast medium.  Marland Kitchen lidocaine (XYLOCAINE) 2 % (with pres) injection 400 mg  . midazolam (VERSED) 5 MG/5ML injection 1-2 mg    Make sure Flumazenil is available in the pyxis when using this medication. If oversedation occurs, administer 0.2 mg IV over 15 sec. If after 45 sec no response, administer 0.2 mg again over 1 min; may repeat at 1 min intervals; not to exceed 4 doses (1 mg)  . fentaNYL (SUBLIMAZE) injection 25-50 mcg    Make sure Narcan is available in the pyxis when using this medication. In the event of respiratory depression (RR< 8/min): Titrate NARCAN (naloxone) in increments of 0.1 to 0.2 mg IV at 2-3 minute intervals, until desired degree of reversal.  . lactated ringers infusion 1,000 mL  . sodium chloride flush (NS) 0.9 % injection 2 mL  . ropivacaine (PF) 2 mg/mL (0.2%) (NAROPIN) injection 2 mL  . triamcinolone acetonide (KENALOG-40) injection 40 mg  . sodium chloride flush (NS) 0.9 % injection 1 mL  . ropivacaine (PF) 2 mg/mL (0.2%) (NAROPIN) injection 1 mL  . dexamethasone (DECADRON) injection 10 mg   Medications administered: We administered iopamidol, lidocaine, midazolam, fentaNYL, lactated ringers,  sodium chloride flush, ropivacaine (PF) 2 mg/mL (0.2%), triamcinolone acetonide, sodium chloride flush, ropivacaine (PF) 2 mg/mL (0.2%), and dexamethasone.  See the medical record for exact dosing, route, and time of administration.  New Prescriptions   No medications on file   Disposition: Discharge home  Discharge Date & Time: 03/30/2018; 1501 hrs.   Physician-requested Follow-up: Return for post-procedure eval (2 wks), w/ Dr. Dossie Arbour.  Future Appointments  Date Time Provider [place]  04/06/2018 11:00 AM Hillis Range, PT [facility] None  04/14/2018  8:15 AM Milinda Pointer, MD [facility]  None  03/23/2019  1:30 PM McGowan, Marlowe Kays None   Primary Care Physician: Idelle Crouch, MD Location: [facility] Note by: Gaspar Cola, MD Date: 03/30/2018; Time: 4:07 PM  Disclaimer:  Medicine is not an Chief Strategy Officer. The only guarantee in medicine is that nothing is guaranteed. It is important to note that the decision to proceed with this intervention was based on the information collected from the patient. The Data and conclusions were drawn from the patient's questionnaire, the interview, and the physical examination. Because the information was provided in large part by the patient, it cannot be guaranteed that it has not been purposely or unconsciously manipulated. Every effort has been made to obtain as much relevant data as possible for this evaluation. It is important to note that the conclusions that lead to this procedure are derived in large part from the available data. Always take into account that the treatment will also be dependent on availability of resources and existing treatment guidelines, considered by other Pain Management Practitioners as being common knowledge and practice, at the time of the intervention. For Medico-Legal purposes, it is also important to point out that variation in procedural techniques and  pharmacological choices are the acceptable norm. The indications, contraindications, technique, and results of the above procedure should only be interpreted and judged by a Board-Certified Interventional Pain Specialist with extensive familiarity and expertise in the same exact procedure and technique.

## 2018-03-30 NOTE — Patient Instructions (Signed)

## 2018-03-31 ENCOUNTER — Telehealth: Payer: Self-pay

## 2018-03-31 NOTE — Telephone Encounter (Signed)
Denies any needs at times. Instructed to call if needed.

## 2018-04-06 ENCOUNTER — Ambulatory Visit: Payer: Medicare Other

## 2018-04-06 VITALS — BP 133/84 | HR 108

## 2018-04-06 DIAGNOSIS — M6281 Muscle weakness (generalized): Secondary | ICD-10-CM

## 2018-04-06 DIAGNOSIS — R278 Other lack of coordination: Secondary | ICD-10-CM | POA: Diagnosis not present

## 2018-04-06 DIAGNOSIS — R2689 Other abnormalities of gait and mobility: Secondary | ICD-10-CM

## 2018-04-06 NOTE — Therapy (Signed)
[place] MAIN [facility] SERVICES [address] [place], [place], [address] Phone: [phone]   Fax:  [phone]  Physical Therapy Treatment/Discharge  Patient Details  Name: MALAN WERK MRN: [id number] Date of Birth: Sep 25, 1946 Referring Provider: Dr. Joanna Hews    Encounter Date: 04/06/2018  PT End of Session - 04/07/18 2116    Visit Number  33    Number of Visits  57    Date for PT Re-Evaluation  04/13/18    PT Start Time  1100    PT Stop Time  1145    PT Time Calculation (min)  45 min    Activity Tolerance  Patient tolerated treatment well;No increased pain    Behavior During Therapy  WFL for tasks assessed/performed       Past Medical History:  Diagnosis Date  . Anemia   . Ataxia   . Bipolar affective disorder ([place])    2  . Broken foot 2015   right foot  . Chronic gastritis   . Chronic kidney disease    Dr Holley Raring  . COPD (chronic obstructive pulmonary disease) ([place])   . Dizziness    inner ear (per pt) several times per week  . Dysphagia   . Glaucoma   . Hyperlipemia   . Hypertension    pt has recently come off BP meds.  MD aware. BP seems stable.  . Lithium toxicity 2015  . 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 Neck stiffness    s/p C3-C4 fusion, limited right turn  . Osteoarthritis    back  . Pancreatitis   . Parkinson disease ([place])   . Personal history of malignant neoplasm of breast   . Pituitary microadenoma ([place])   . Recurrent UTI   . Renal insufficiency   . Stomach ulcer 2112    Past Surgical History:  Procedure Laterality Date  . APPENDECTOMY    . BREAST BIOPSY Right 2011   neg  . BREAST EXCISIONAL BIOPSY Right 2001   neg  . BREAST EXCISIONAL BIOPSY Right 2008   breast ca 2008 radiation  . BREAST EXCISIONAL BIOPSY Left 10/26/2012   neg  . BREAST SURGERY Left  2013   left breast wide excision,Intraductal  papilloma, ductal hyperplasia and sclerosing adenosis. Microcalcifications associated with columnar cell change. No evidence of atypia or malignancy. Margins are unremarkable.  Marland Kitchen BREAST SURGERY Right November 06, 1999   multiple areas of microcalcification showing evidence of sclerosing adenosis and ductal hyperplasia.  Marland Kitchen BREAST SURGERY Left May 07, 2007   wide excision.  . cataract Right 2013  . CATARACT EXTRACTION W/PHACO Left 03/05/2016   Procedure: CATARACT EXTRACTION PHACO AND INTRAOCULAR LENS PLACEMENT (IOC);  Surgeon: Leandrew Koyanagi, MD;  Location: [place];  Service: Ophthalmology;  Laterality: Left;  . COLONOSCOPY  2004  . COLONOSCOPY N/A 05/14/2015   Procedure: COLONOSCOPY;  Surgeon: Manya Silvas, MD;  Location: [facility] ENDOSCOPY;  Service: Endoscopy;  Laterality: N/A;  . ERCP N/A   . ESOPHAGOGASTRODUODENOSCOPY N/A 05/14/2015   Procedure: ESOPHAGOGASTRODUODENOSCOPY (EGD);  Surgeon: Manya Silvas, MD;  Location: [facility] ENDOSCOPY;  Service: Endoscopy;  Laterality: N/A;  . EUS N/A 2011, 2012  . EYE SURGERY  2013  . GLAUCOMA SURGERY Right 2013  . LAPAROSCOPIC RIGHT COLECTOMY Right 06/22/2015   Procedure: LAPAROSCOPIC RIGHT COLECTOMY;  Surgeon: Robert Bellow, MD;  Location: [facility] ORS;  Service: General;  Laterality:  Right;  Marland Kitchen NECK SURGERY  2006  . right breast cancer    . SAVORY DILATION N/A 05/14/2015   Procedure: SAVORY DILATION;  Surgeon: Manya Silvas, MD;  Location: [facility] ENDOSCOPY;  Service: Endoscopy;  Laterality: N/A;  . TRABECULECTOMY Left 03/05/2016   Procedure: TRABECULECTOMY WITH [facility] AND [employer];  Surgeon: Leandrew Koyanagi, MD;  Location: [place];  Service: Ophthalmology;  Laterality: Left;  . TUBAL LIGATION      Vitals:   04/06/18 1110  BP: 133/84  Pulse: (!) 108  SpO2: 95%    Subjective Assessment - 04/07/18 2127    Subjective  Pt reports that she took half a [place] this morning and she is not having any pain currently. No  specific questions or concerns currently. She has been enjoying aquatic therapy but states that she thinks it helps her more with her back pain than her balance.     Pertinent History  72 yo Female with recent increase in back pain with subsequent numbness/tingling , weakness, and drop foot in B LEs, L>R starting about 3 months ago. She has had increased instability in gait and falls recently with minor injuries. Pt had recent epidural steroid injection L4-5, and L5-S1 with decrease in back pain since.Pt with extensive surgical and medical Hx including breast cancer (last surgery 2013), DJD with cervical fusion C3-5 in 2006, bipolar disorder, HTN, OA in bilateral shoulders L>R, hip and SIJ pain x >20 years.     Limitations  Lifting;Standing;Walking;House hold activities    How long can you sit comfortably?  Unlimited    How long can you stand comfortably?  20-30 minutes before balance gets worse    How long can you walk comfortably?  40' approx before drop foot or pain starts.     Diagnostic tests  MRI 05/20/17: Chronic lumbar scoliosis with mild grade 1 spondylolisthesis from L3 L4 to L5-S1. Advanced disc and facet degeneration at those levels. Mild to moderate spinal and foraminal stenosis at L3-L4 and L4-L5. Right L5 radiculitis. Moderate neural foraminal stenosis also at the left L1 and both L2 nerve levels.    Patient Stated Goals  To stop falling    Currently in Pain?  No/denies         [facility] PT Assessment - 04/07/18 2118      ROM / Strength   AROM / PROM / Strength  Strength      Strength   Strength Assessment Site  Hip;Knee;Ankle    Right/Left Hip  Right;Left    Right Hip Flexion  4+/5    Right Hip ABduction  4-/5    Right Hip ADduction  4-/5    Left Hip Flexion  4-/5    Left Hip ABduction  4-/5    Left Hip ADduction  4-/5    Right Knee Flexion  4+/5    Right Knee Extension  4+/5    Left Knee Flexion  4+/5    Left Knee Extension  4+/5    Right Ankle Dorsiflexion  4-/5    Left  Ankle Dorsiflexion  4-/5      Standardized Balance Assessment   Standardized Balance Assessment  Berg Balance Test    Five times sit to stand comments   18.5 s without UE assist    10 Meter Walk  0.71 m/s self selected,       Berg Balance Test   Sit to Stand  Able to stand  independently using hands    Standing Unsupported  Able to stand 2 minutes with supervision    Sitting with Back Unsupported but Feet Supported on Floor or Stool  Able to sit safely and securely 2 minutes    Stand to Sit  Sits safely with minimal use of hands    Transfers  Able to transfer safely, minor use of hands    Standing Unsupported with Eyes Closed  Able to stand 10 seconds with supervision    Standing Ubsupported with Feet Together  Needs help to attain position but able to stand for 30 seconds with feet together    From Standing, Reach Forward with Outstretched Arm  Can reach forward >12 cm safely (5")    From Standing Position, Pick up Object from [place] to pick up shoe safely and easily    From Standing Position, Turn to Look Behind Over each Shoulder  Looks behind from both sides and weight shifts well    Turn 360 Degrees  Able to turn 360 degrees safely in 4 seconds or less    Standing Unsupported, Alternately Place Feet on Step/Stool  Able to complete 4 steps without aid or supervision    Standing Unsupported, One Foot in Front  Able to plae foot ahead of the other independently and hold 30 seconds    Standing on One Leg  Able to lift leg independently and hold equal to or more than 3 seconds    Total Score  44      Functional Gait  Assessment   Gait assessed   Yes    Gait Level Surface  Walks 20 ft, slow speed, abnormal gait pattern, evidence for imbalance or deviates 10-15 in outside of the 12 in walkway width. Requires more than 7 sec to ambulate 20 ft.    Change in Gait Speed  Able to change speed, demonstrates mild gait deviations, deviates 6-10 in outside of the 12 in walkway width, or no gait  deviations, unable to achieve a major change in velocity, or uses a change in velocity, or uses an assistive device.    Gait with Horizontal Head Turns  Performs head turns with moderate changes in gait velocity, slows down, deviates 10-15 in outside 12 in walkway width but recovers, can continue to walk.    Gait with Vertical Head Turns  Performs task with moderate change in gait velocity, slows down, deviates 10-15 in outside 12 in walkway width but recovers, can continue to walk.    Gait and Pivot Turn  Turns slowly, requires verbal cueing, or requires several small steps to catch balance following turn and stop    Step Over Obstacle  Is able to step over one shoe box (4.5 in total height) but must slow down and adjust steps to clear box safely. May require verbal cueing.    Gait with Narrow Base of Support  Ambulates less than 4 steps heel to toe or cannot perform without assistance.    Gait with Eyes Closed  Walks 20 ft, slow speed, abnormal gait pattern, evidence for imbalance, deviates 10-15 in outside 12 in walkway width. Requires more than 9 sec to ambulate 20 ft.    Ambulating Backwards  Walks 20 ft, uses assistive device, slower speed, mild gait deviations, deviates 6-10 in outside 12 in walkway width.    Steps  Two feet to a stair, must use rail.    Total Score  11        TREATMENT  Ther-ex  Performed outcome measures with patient including BERG (  44/56), FGA (11/30), 5TSTS (18.5s), stairs, 60mgait speed (14.0s=0.71 m/s self-selected, 9.4s = 1.06 m/s fastest), and LE strength testing;  LE strength R/L Hip flexion: 4+/4- Hip abduction (stting): 4-/4- Hip adduction (sitting): 4-/4- Knee extension: 4+/4+ Knee flexion: 4+/4+ Ankle dorsiflexion: 4-/4-                        PT Short Term Goals - 04/06/18 1120      PT SHORT TERM GOAL #1   Title  Patient will deny any falls over past 4 weeks to demonstrate improved safety awareness at home and work.      Baseline  has multiple near misses; [place] [employer]; 3/4: pt reports ~10 falls in the past 4 weeks    Time  4    Period  Weeks    Status  Not Met      PT SHORT TERM GOAL #2   Title  Pt will obtain and appropriately use LRAD (Rollator suggested) in order to decraese fall risk and increase community access.     Baseline  Pt has obtained Rollator for use outside    Time  2    Period  Weeks    Status  Achieved        PT Long Term Goals - 04/06/18 1120      PT LONG TERM GOAL #1   Title  Patient will be independent in home exercise program to improve strength/mobility for better functional independence with ADLs;     Baseline  Pt does part of her HEP about 3-4 times per week; 3/4: pt completing HEP 1/wk in addition to her therapy    Time  4    Period  Weeks    Status  Partially Met      PT LONG TERM GOAL #2   Title  Patient will increase Berg Balance score by > 6 points to demonstrate decreased fall risk during functional activities.; Deferred as patient's balance is more impaired dynamically and therefore FGA goal more appropriate;     Baseline  will defer at this time; 3/4: 44/56; 04/06/18: 44/56    Time  4    Period  Weeks    Status  On-going      PT LONG TERM GOAL #3   Title  Patient (> 675years old) will complete five times sit to stand test in < 15 seconds indicating an increased LE strength and improved balance.    Baseline  15 sec without HHA; 04/06/18: 18.5s without UE assist    Time  4    Period  Weeks    Status  On-going      PT LONG TERM GOAL #4   Title  Patient will ascend/descend 4 stairs without rail assist independently without loss of balance to improve ability to get in/out of home.     Baseline  able to negotiate reciprocally but requires rail assist; 3:4: able to negotiate recriprocally but requires railing use for descending steps, 04/06/18: unchanged    Time  4    Period  Weeks    Status  Partially Met      PT LONG TERM GOAL #5   Title   Patient will increase BLE gross strength to 4/5 as to improve functional strength for independent gait, increased standing tolerance and increased ADL ability.    Baseline  ankle 3+/5; 04/07/18: see flowsheet, not met    Time  4  Period  Weeks    Status  Not Met      PT LONG TERM GOAL #6   Title  Patient will increase Functional Gait Assessment score to >20/30 as to reduce fall risk and improve dynamic gait safety with community ambulation.    Baseline  20/30; 04/07/18: 11/30    Time  4    Period  Weeks    Status  Not Met            Plan - 04/07/18 2117    Clinical Impression Statement  Pt states that she is only performing her HEP 1x/wk currently. She enjoys aquatic therapy but states that she feels like it helps her back pain more than her balance. Her BERG score has not changed since it was last assessed and her FGA is actually worse today. She is still unable to perform stairs without UE support on railings. No notable change in LE strength. At this time pt is not demonstrating progress with therapy and has not been compliant with her HEP. She will be discharged at this time. Pt encouraged to engage in regular community based exercise program such as a local gym, [employer], [employer] aquatic center, or [employer]. Pt is in agreement. Pt encouraged to return if she notes decline in her balance or function.      Rehab Potential  Fair    Clinical Impairments Affecting Rehab Potential  Pt does not use AD and had decreased knowledge of her limitations    PT Frequency  One time visit    PT Duration  -- 1 week    PT Treatment/Interventions  ADLs/Self Care Home Management;Aquatic Therapy;Biofeedback;Canalith Repostioning;Cryotherapy;Moist Heat;Cognitive remediation;Neuromuscular re-education;Balance training;Therapeutic exercise;Therapeutic activities;Functional mobility training;Stair training;Gait training;DME Instruction;Patient/family education;Orthotic Fit/Training;Manual  techniques;Passive range of motion;Visual/perceptual remediation/compensation;Vestibular;Energy conservation;Dry needling    PT Next Visit Plan  Discharge    PT Home Exercise Plan  continue as given;     Consulted and Agree with Plan of Care  Patient       Patient will benefit from skilled therapeutic intervention in order to improve the following deficits and impairments:  Abnormal gait, Decreased activity tolerance, Decreased balance, Decreased cognition, Decreased coordination, Decreased safety awareness, Decreased mobility, Decreased knowledge of use of DME, Decreased knowledge of precautions, Decreased endurance, Decreased skin integrity, Decreased strength, Difficulty walking, Dizziness, Postural dysfunction, Improper body mechanics, Impaired vision/preception, Impaired UE functional use, Impaired sensation, Hypomobility, Decreased range of motion, Impaired flexibility, Pain  Visit Diagnosis: Other lack of coordination  Other abnormalities of gait and mobility  Muscle weakness (generalized)     Problem List Patient Active Problem List   Diagnosis Date Noted  . Spondylosis without myelopathy or radiculopathy, lumbosacral region 03/11/2018  . Other specified dorsopathies, sacral and sacrococcygeal region 03/11/2018  . Ataxic gait 11/20/2017  . Gait instability 11/20/2017  . 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 Cognitive decline 11/20/2017  . PBA (pseudobulbar affect) 11/20/2017  . Truncal ataxia 11/20/2017  . Lumbar facet syndrome (Bilateral) (L>R) 07/29/2017  . Chronic sacroiliac joint pain (Bilateral) (L>R) 07/29/2017  . Chronic hip pain (Bilateral) (L>R) 07/29/2017  . Chronic lower extremity pain (Secondary Area of Pain) (Bilateral) (L>R) 07/29/2017  . Impairment of balance 07/07/2017  . Menopausal and female climacteric states 06/15/2017  . Lumbar foraminal stenosis (T12-S1, multilevel) 06/10/2017  . Lumbar lateral recess stenosis (left L2-3, bilateral L3-4, & right  L4-5) 06/10/2017  . Grade 1 Anterolisthesis of L4 over L5 and L5 over S1 06/10/2017  . Lumbar facet hypertrophy (multilevel)  06/10/2017  . Lumbar Ligamentum flavum hypertrophy (HCC) (multilevel) 06/10/2017  . Dropfoot (Left) (L5 radiculopathy) 06/10/2017  . Neurogenic pain 06/09/2017  . DDD (degenerative disc disease), lumbar 06/09/2017  . 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 Closed fracture of metatarsal bone 05/06/2017  . 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 Abnormal MRI, cervical spine 05/06/2017  . 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[place]) 05/06/2017  . Cervical radiculitis 05/06/2017  . Chronic lumbar radiculopathy (Bilateral) (L>R) (left L5) 05/06/2017  . Chronic low back pain (Primary Area of Pain) (Bilateral) (L>R) 05/06/2017  . Abnormal MRI, lumbar spine 05/06/2017  . Chronic pain syndrome 03/19/2017  . Chronic shoulder pain (Fourth Area of Pain) (Bilateral) (L>R) 03/19/2017  . Osteoarthritis of shoulder (Bilateral) 03/19/2017  . Chronic neck pain [facility] Area of Pain) (Bilateral) (L>R) 03/19/2017  . History of C3-5 ACDF 03/19/2017  . Numbness and tingling in left upper extremity 03/19/2017  . Numbness and tingling of right upper extremity 03/19/2017  . Upper extremity weakness (Bilateral) (L>R) 03/19/2017  . Numbness of upper extremity (Bilateral) (L>R) 03/19/2017  . Numbness and tingling of lower extremity (Bilateral) (L>R) 03/19/2017  . Weakness of lower extremity (Bilateral) (R>L) 03/19/2017  . Chronic abdominal pain (epigastric) 03/19/2017  . Pituitary microadenoma ([place]) 12/08/2016  . History of breast cancer in adulthood 12/08/2016  . Dysmetria 12/08/2016  . Loss of weight 08/15/2015  . Colon polyp 05/25/2015  . Personal history of malignant neoplasm of breast 07/18/2014  . Breast cancer ([place]) 06/28/2014  . Bipolar disorder ([place]) 06/28/2014  . Chronic  pancreatitis ([place]) 06/28/2014  . HTN (hypertension) 06/28/2014  . OA (osteoarthritis) 06/28/2014  . Renal insufficiency 06/28/2014  . DDD (degenerative disc disease), cervical 06/28/2014  . Hyperlipidemia 06/28/2014  . COPD (chronic obstructive pulmonary disease) ([place]) 05/17/2014  . Breast cancer of upper-outer quadrant of right female breast ([place]) 05/31/2008   Phillips Grout PT, DPT   Ebubechukwu Jedlicka 04/07/2018, 9:27 PM  [place] MAIN [facility] SERVICES [address] [place], [place], [address] Phone: [phone]   Fax:  [phone]  Name: AEVAH STANSBERY MRN: [id number] Date of Birth: 1946/01/24

## 2018-04-13 NOTE — Progress Notes (Signed)
Patient's Name: Elaine Clayton  MRN: [id number]  Referring Provider: Idelle Crouch, MD  DOB: 30-Dec-1945  PCP: Idelle Crouch, MD  DOS: 04/14/2018  Note by: Gaspar Cola, MD  Service setting: Ambulatory outpatient  Specialty: Interventional Pain Management  Location: [facility] (AMB) Pain Management Facility    Patient type: Established   Primary Reason(s) for Visit: Encounter for post-procedure evaluation of chronic illness with mild to moderate exacerbation CC: Back Pain (lower)  HPI  Elaine Clayton is a 72 y.o. year old, female patient, who comes today for a post-procedure evaluation. She has Breast cancer of upper-outer quadrant of right female breast ([place]); Personal history of malignant neoplasm of breast; Colon polyp; Loss of weight; Pituitary microadenoma ([place]); History of breast cancer in adulthood; Dysmetria; Breast cancer ([place]); Bipolar disorder ([place]); Chronic pancreatitis ([place]); COPD (chronic obstructive pulmonary disease) (HCC); HTN (hypertension); OA (osteoarthritis); Renal insufficiency; Chronic pain syndrome; Chronic shoulder pain (Fourth Area of Pain) (Bilateral) (L>R); Osteoarthritis of shoulder (Bilateral); Chronic neck pain (Tertiary Area of Pain) (Bilateral) (L>R); History of C3-5 ACDF; Numbness and tingling in left upper extremity; Numbness and tingling of right upper extremity; Upper extremity weakness (Bilateral) (L>R); Numbness of upper extremity (Bilateral) (L>R); Numbness and tingling of lower extremity (Bilateral) (L>R); Weakness of lower extremity (Bilateral) (R>L); Chronic abdominal pain (epigastric); Closed fracture of metatarsal bone; 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); 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); Abnormal MRI, cervical spine; 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[place]); Cervical radiculitis; Chronic lumbar radiculopathy (Bilateral) (L>R) (left L5); Chronic low back pain (Primary Area of Pain) (Bilateral) (L>R); Abnormal MRI, lumbar spine;  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); DDD (degenerative disc disease), cervical; Neurogenic pain; DDD (degenerative disc disease), lumbar; Lumbar foraminal stenosis (T12-S1, multilevel); Lumbar lateral recess stenosis (left L2-3, bilateral L3-4, & right L4-5); Grade 1 Anterolisthesis of L4 over L5 and L5 over S1; Lumbar facet hypertrophy (multilevel); Lumbar Ligamentum flavum hypertrophy (HCC) (multilevel); Dropfoot (Left) (L5 radiculopathy); Menopausal and female climacteric states; Impairment of balance; Lumbar facet syndrome (Bilateral) (L>R); Chronic sacroiliac joint pain (Bilateral) (L>R); Chronic hip pain (Bilateral) (L>R); Chronic lower extremity pain (Secondary Area of Pain) (Bilateral) (L>R); Hyperlipidemia; Ataxic gait; Gait instability; 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 Her primarily concern today is the Back Pain (lower)  Pain Assessment: Location: Lower Back Radiating: Radiates down right leg Onset: More than a month ago Duration: Chronic pain Quality: Aching, Burning, Sharp Severity: 6 /10 (self-reported pain score)  Note: Reported level is inconsistent with clinical observations. Clinically the patient looks like a 3/10 A 3/10 is viewed as "Moderate" and described as significantly interfering with activities of daily living (ADL). It becomes difficult to feed, bathe, get dressed, get on and off the toilet or to perform personal hygiene functions. Difficult to get in and out of bed or a chair without assistance. Very distracting. With effort, it can be ignored when deeply involved in activities.       When using our objective Pain Scale, levels between 6 and 10/10 are said to belong in an emergency room, as it progressively worsens from a 6/10,  described as severely limiting, requiring emergency care not usually available at an outpatient pain management facility. At a 6/10 level, communication becomes difficult and requires great effort. Assistance to reach the emergency department may be required. Facial flushing and profuse sweating along with potentially dangerous  increases in heart rate and blood pressure will be evident. Effect on ADL: prolonging walking Timing: Constant Modifying factors: meds, heat, ice heat  Elaine Clayton comes in today for post-procedure evaluation after the treatment done on 03/30/2018.  Further details on both, my assessment(s), as well as the proposed treatment plan, please see below.  Post-Procedure Assessment  03/30/2018 Procedure: Diagnostic/therapeutic left interlaminar L1-2 LESI + left transforaminal L2 LESI under fluoroscopic guidance and IV sedation Pre-procedure pain score:  6/10 Post-procedure pain score: 4/10 (< 50% relief) Influential Factors: BMI: 17.68 kg/m Intra-procedural challenges: None observed.         Assessment challenges: None detected.              Reported side-effects: None.        Post-procedural adverse reactions or complications: None reported         Sedation: Please see nurses note. When no sedatives are used, the analgesic levels obtained are directly associated to the effectiveness of the local anesthetics. However, when sedation is provided, the level of analgesia obtained during the initial 1 hour following the intervention, is believed to be the result of a combination of factors. These factors may include, but are not limited to: 1. The effectiveness of the local anesthetics used. 2. The effects of the analgesic(s) and/or anxiolytic(s) used. 3. The degree of discomfort experienced by the patient at the time of the procedure. 4. The patients ability and reliability in recalling and recording the events. 5. The presence and influence of possible secondary gains and/or  psychosocial factors. Reported result: Relief experienced during the 1st hour after the procedure: 100 % (Ultra-Short Term Relief)            Interpretative annotation: Clinically appropriate result. Analgesia during this period is likely to be Local Anesthetic and/or IV Sedative (Analgesic/Anxiolytic) related.          Effects of local anesthetic: The analgesic effects attained during this period are directly associated to the localized infiltration of local anesthetics and therefore cary significant diagnostic value as to the etiological location, or anatomical origin, of the pain. Expected duration of relief is directly dependent on the pharmacodynamics of the local anesthetic used. Long-acting (4-6 hours) anesthetics used.  Reported result: Relief during the next 4 to 6 hour after the procedure: 50 % (Short-Term Relief)            Interpretative annotation: Clinically appropriate result. Analgesia during this period is likely to be Local Anesthetic-related.          Long-term benefit: Defined as the period of time past the expected duration of local anesthetics (1 hour for short-acting and 4-6 hours for long-acting). With the possible exception of prolonged sympathetic blockade from the local anesthetics, benefits during this period are typically attributed to, or associated with, other factors such as analgesic sensory neuropraxia, antiinflammatory effects, or beneficial biochemical changes provided by agents other than the local anesthetics.  Reported result: Extended relief following procedure: 80 % (Long-Term Relief)            Interpretative annotation: Clinically appropriate result. Good relief. No permanent benefit expected. Inflammation plays a part in the etiology to the pain.          Current benefits: Defined as reported results that persistent at this point in time.   Analgesia: 80 %            Function: Somewhat improved ROM: Somewhat improved Interpretative annotation: Recurrence of  symptoms. No permanent benefit expected. Effective  diagnostic intervention.          Interpretation: Results would suggest a successful diagnostic intervention.                  Plan:  Please see "Plan of Care" for details.                Laboratory Chemistry  Inflammation Markers (CRP: Acute Phase) (ESR: Chronic Phase) Lab Results  Component Value Date   CRP <0.8 03/19/2017   ESRSEDRATE 4 03/19/2017                         Renal Function Markers Lab Results  Component Value Date   BUN 12 03/19/2017   CREATININE 0.99 03/19/2017   GFRAA >60 03/19/2017   GFRNONAA 57 (L) 03/19/2017                              Hepatic Function Markers Lab Results  Component Value Date   AST 22 03/19/2017   ALT 14 03/19/2017   ALBUMIN 4.5 03/19/2017   ALKPHOS 53 03/19/2017                        Electrolytes Lab Results  Component Value Date   NA 139 03/19/2017   K 4.0 03/19/2017   CL 107 03/19/2017   CALCIUM 9.5 03/19/2017   MG 2.1 03/19/2017                        Neuropathy Markers Lab Results  Component Value Date   VITAMINB12 558 03/19/2017                        Bone Pathology Markers Lab Results  Component Value Date   25OHVITD1 57 03/19/2017   25OHVITD2 1.2 03/19/2017   25OHVITD3 56 03/19/2017                         Coagulation Parameters Lab Results  Component Value Date   PLT 206 06/25/2015                        Cardiovascular Markers Lab Results  Component Value Date   HGB 10.0 (L) 06/25/2015   HCT 31.1 (L) 06/25/2015                         Note: Lab results reviewed.  Recent Diagnostic Imaging Results  DG C-Arm 1-60 Min-No Report Fluoroscopy was utilized by the requesting physician.  No radiographic  interpretation.   Complexity Note: I personally reviewed the fluoroscopic imaging of the procedure.                        Meds   Current Outpatient Medications:  .  ALPRAZolam (XANAX) 0.5 MG tablet, Take 0.5 mg by mouth 2 (two) times daily as  needed for sleep. 0.5-1 tablet, Disp: , Rfl:  .  AZOPT 1 % ophthalmic suspension, Place 1 drop into the left eye. , Disp: , Rfl: 0 .  bisacodyl (DULCOLAX) 5 MG EC tablet, Take 5 mg by mouth daily as needed for moderate constipation., Disp: , Rfl:  .  Brimonidine Tartrate-Timolol (COMBIGAN OP), Apply 1 drop to eye 2 (two) times daily., Disp: , Rfl:  .  buPROPion (WELLBUTRIN SR) 150 MG 12 hr tablet, Take 150 mg by mouth every morning. 2 tablets, Disp: , Rfl:  .  dextromethorphan 15 MG/5ML syrup, Take 10 mLs by mouth 4 (four) times daily as needed for cough., Disp: , Rfl:  .  escitalopram (LEXAPRO) 10 MG tablet, Take 10 mg by mouth daily. , Disp: , Rfl: 0 .  estradiol-levonorgestrel (CLIMARA PRO) 0.045-0.015 MG/DAY, apply 1 patch every week, Disp: 12 patch, Rfl: 0 .  fluticasone furoate-vilanterol (BREO ELLIPTA) 100-25 MCG/INH AEPB, Inhale 1 puff into the lungs daily., Disp: 3 each, Rfl: 3 .  ipratropium (ATROVENT) 0.06 % nasal spray, Place 2 sprays into both nostrils 4 (four) times daily as needed for rhinitis., Disp: , Rfl:  .  losartan (COZAAR) 25 MG tablet, Take 25 mg by mouth daily., Disp: , Rfl:  .  LUMIGAN 0.01 % SOLN, apply 1 drop to into both  eyes at bedtime once daily ONLY, Disp: , Rfl: 0 .  mirabegron ER (MYRBETRIQ) 25 MG TB24 tablet, Take 1 tablet (25 mg total) by mouth daily., Disp: 90 tablet, Rfl: 3 .  nicotine polacrilex (NICORETTE) 2 MG gum, Take 2 mg by mouth as needed for smoking cessation., Disp: , Rfl:  .  ondansetron (ZOFRAN) 8 MG tablet, Take by mouth every 8 (eight) hours as needed for nausea or vomiting., Disp: , Rfl:  .  rosuvastatin (CRESTOR) 10 MG tablet, Take 10 mg by mouth at bedtime. , Disp: , Rfl:  .  VENTOLIN HFA 108 (90 Base) MCG/ACT inhaler, inhale 2 puffs by mouth INTO THE LUNGS every 6 hours if needed for wheezing or shortness of breath, Disp: 18 g, Rfl: 6 .  gabapentin (NEURONTIN) 100 MG capsule, Take 1-3 capsules (100-300 mg total) by mouth at bedtime., Disp: 90  capsule, Rfl: 0 .  HYDROcodone-acetaminophen (NORCO/VICODIN) 5-325 MG tablet, Take 1 tablet by mouth every 6 (six) hours as needed for moderate pain., Disp: 60 tablet, Rfl: 0 .  lidocaine (LIDODERM) 5 %, Place 1 patch onto the skin every 12 (twelve) hours. Remove & Discard patch within 12 hours or as directed by MD, Disp: 60 patch, Rfl: 11  ROS  Constitutional: Denies any fever or chills Gastrointestinal: No reported hemesis, hematochezia, vomiting, or acute GI distress Musculoskeletal: Denies any acute onset joint swelling, redness, loss of ROM, or weakness Neurological: No reported episodes of acute onset apraxia, aphasia, dysarthria, agnosia, amnesia, paralysis, loss of coordination, or loss of consciousness  Allergies  Elaine Clayton is allergic to influenza vaccines; flu virus vaccine; gabapentin; sulfa antibiotics; and tetracyclines & related.  [place]  Drug: Elaine Clayton  reports that she does not use drugs. Alcohol:  reports that she drinks alcohol. Tobacco:  reports that she quit smoking about 10 years ago. She has a 35.00 pack-year smoking history. She has never used smokeless tobacco. Medical:  has a past medical history of Anemia, Ataxia, Bipolar affective disorder ([place]), Broken foot (2015), Chronic gastritis, Chronic kidney disease, COPD (chronic obstructive pulmonary disease) ([place]), Dizziness, Dysphagia, Glaucoma, Hyperlipemia, Hypertension, Lithium toxicity (2015), Malignant neoplasm of upper-outer quadrant of female breast [facility]) (May 07, 2007), Neck stiffness, Osteoarthritis, Pancreatitis, Parkinson disease ([place]), Personal history of malignant neoplasm of breast, Pituitary microadenoma ([place]), Recurrent UTI, Renal insufficiency, and Stomach ulcer ([place]). Surgical: Elaine Clayton  has a past surgical history that includes Colonoscopy (2004); Neck surgery (2006); Eye surgery (2013); Appendectomy; Tubal ligation; Colonoscopy (N/A, 05/14/2015); Esophagogastroduodenoscopy (N/A, 05/14/2015); Savory  dilation (N/A, 05/14/2015); ERCP (N/A); Glaucoma surgery (Right, 2013); cataract (Right,  2013); Breast surgery (Left,  2013); Breast surgery (Right, November 06, 1999); Breast surgery (Left, May 07, 2007); EUS (N/A, 2011, 2012); Laparoscopic right colectomy (Right, 06/22/2015); right breast cancer; Trabeculectomy (Left, 03/05/2016); Cataract extraction w/PHACO (Left, 03/05/2016); Breast biopsy (Right, 2011); Breast excisional biopsy (Right, 2001); Breast excisional biopsy (Right, 2008); and Breast excisional biopsy (Left, 10/26/2012). Family: family history includes Breast cancer (age of onset: 30) in her mother.  Constitutional Exam  General appearance: Well nourished, well developed, and well hydrated. In no apparent acute distress Vitals:   04/14/18 0815  BP: (!) 176/69  Pulse: 72  Temp: 98.2 F (36.8 C)  SpO2: 100%  Weight: 103 lb (46.7 kg)  Height: '5\' 4"'$  (1.626 m)   BMI Assessment: Estimated body mass index is 17.68 kg/m as calculated from the following:   Height as of this encounter: '5\' 4"'$  (1.626 m).   Weight as of this encounter: 103 lb (46.7 kg).  BMI interpretation table: BMI level Category Range association with higher incidence of chronic pain  <18 kg/m2 Underweight   18.5-24.9 kg/m2 Ideal body weight   25-29.9 kg/m2 Overweight Increased incidence by 20%  30-34.9 kg/m2 Obese (Class I) Increased incidence by 68%  35-39.9 kg/m2 Severe obesity (Class II) Increased incidence by 136%  >40 kg/m2 Extreme obesity (Class III) Increased incidence by 254%   BMI Readings from Last 4 Encounters:  04/14/18 17.68 kg/m  03/30/18 17.51 kg/m  03/24/18 19.02 kg/m  03/11/18 18.84 kg/m   Wt Readings from Last 4 Encounters:  04/14/18 103 lb (46.7 kg)  03/30/18 102 lb (46.3 kg)  03/24/18 104 lb (47.2 kg)  03/11/18 103 lb (46.7 kg)  Psych/Mental status: Alert, oriented x 3 (person, place, & time)       Eyes: PERLA Respiratory: No evidence of acute respiratory distress  Cervical Spine  Area Exam  Skin & Axial Inspection: No masses, redness, edema, swelling, or associated skin lesions Alignment: Symmetrical Functional ROM: Unrestricted ROM      Stability: No instability detected Muscle Tone/Strength: Functionally intact. No obvious neuro-muscular anomalies detected. Sensory (Neurological): Unimpaired Palpation: No palpable anomalies              Upper Extremity (UE) Exam    Side: Right upper extremity  Side: Left upper extremity  Skin & Extremity Inspection: Skin color, temperature, and hair growth are WNL. No peripheral edema or cyanosis. No masses, redness, swelling, asymmetry, or associated skin lesions. No contractures.  Skin & Extremity Inspection: Skin color, temperature, and hair growth are WNL. No peripheral edema or cyanosis. No masses, redness, swelling, asymmetry, or associated skin lesions. No contractures.  Functional ROM: Unrestricted ROM          Functional ROM: Unrestricted ROM          Muscle Tone/Strength: Functionally intact. No obvious neuro-muscular anomalies detected.  Muscle Tone/Strength: Functionally intact. No obvious neuro-muscular anomalies detected.  Sensory (Neurological): Unimpaired          Sensory (Neurological): Unimpaired          Palpation: No palpable anomalies              Palpation: No palpable anomalies              Specialized Test(s): Deferred         Specialized Test(s): Deferred          Thoracic Spine Area Exam  Skin & Axial Inspection: No masses, redness, or swelling Alignment: Symmetrical Functional ROM: Unrestricted ROM Stability: No instability detected Muscle  Tone/Strength: Functionally intact. No obvious neuro-muscular anomalies detected. Sensory (Neurological): Unimpaired Muscle strength & Tone: No palpable anomalies  Lumbar Spine Area Exam  Skin & Axial Inspection: No masses, redness, or swelling Alignment: Symmetrical Functional ROM: Unrestricted ROM       Stability: No instability detected Muscle Tone/Strength:  Functionally intact. No obvious neuro-muscular anomalies detected. Sensory (Neurological): Unimpaired Palpation: No palpable anomalies       Provocative Tests: Lumbar Hyperextension and rotation test: evaluation deferred today       Lumbar Lateral bending test: evaluation deferred today       Patrick's Maneuver: evaluation deferred today                    Gait & Posture Assessment  Ambulation: Patient ambulates using a cane Gait: Ataxic Posture: Antalgic   Lower Extremity Exam    Side: Right lower extremity  Side: Left lower extremity  Skin & Extremity Inspection: Skin color, temperature, and hair growth are WNL. No peripheral edema or cyanosis. No masses, redness, swelling, asymmetry, or associated skin lesions. No contractures.  Skin & Extremity Inspection: Skin color, temperature, and hair growth are WNL. No peripheral edema or cyanosis. No masses, redness, swelling, asymmetry, or associated skin lesions. No contractures.  Functional ROM: Unrestricted ROM          Functional ROM: Unrestricted ROM          Muscle Tone/Strength: Functionally intact. No obvious neuro-muscular anomalies detected.  Muscle Tone/Strength: Functionally intact. No obvious neuro-muscular anomalies detected.  Sensory (Neurological): Unimpaired  Sensory (Neurological): Unimpaired  Palpation: No palpable anomalies  Palpation: No palpable anomalies   Assessment  Primary Diagnosis & Pertinent Problem List: The primary encounter diagnosis was Chronic low back pain (Primary Area of Pain) (Bilateral) (L>R). Diagnoses of Chronic lower extremity pain (Secondary Area of Pain) (Bilateral) (L>R), Chronic lumbar radiculopathy (Bilateral) (L>R) (left L5), DDD (degenerative disc disease), lumbar, Grade 1 Anterolisthesis of L4 over L5 and L5 over S1, Dropfoot (Left) (L5 radiculopathy), Chronic hip pain (Bilateral) (L>R), and Allodynia were also pertinent to this visit.  Status Diagnosis  Improved Improved Persistent 1. Chronic  low back pain (Primary Area of Pain) (Bilateral) (L>R)   2. Chronic lower extremity pain (Secondary Area of Pain) (Bilateral) (L>R)   3. Chronic lumbar radiculopathy (Bilateral) (L>R) (left L5)   4. DDD (degenerative disc disease), lumbar   5. Grade 1 Anterolisthesis of L4 over L5 and L5 over S1   6. Dropfoot (Left) (L5 radiculopathy)   7. Chronic hip pain (Bilateral) (L>R)   8. 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 lidocaine (LIDODERM) 5 %    Sig: Place 1 patch onto the skin every 12 (twelve) hours. Remove & Discard patch within 12 hours or as directed by MD    Dispense:  60 patch    Refill:  11  Do not place medication on "Automatic Refill". Fill one day early if pharmacy is closed on scheduled refill date.   Medications administered today: Elaine Clayton had no medications administered during this visit.   Procedure Orders     HIP INJECTION Lab Orders  No laboratory test(s) ordered today   Imaging Orders  No imaging studies ordered today    Referral Orders     Ambulatory referral to Physical Therapy  Interventional management options: Planned, scheduled, and/or pending:   Diagnostic right hip and/or IT Band Block under fluoro, no sedation    Considering:    Diagnostic bilateral lumbar facet block Possible bilateral lumbar facet RFA Diagnostic bilateral sacroiliac joint block Possible bilateral sacroiliac joint RFA Diagnostic left cervical epidural steroid injection Diagnostic left L4-5 interlaminar lumbar epidural steroid injection Diagnostic left L5-S1 transforaminal epidural steroid injection   Palliative PRN treatment(s):   Palliative left cervical epidural steroid injection#2 Palliativeleft L4-5 interlaminar lumbar epidural steroid injection + left L5-S1 transforaminal   Provider-requested follow-up: Return for Procedure (no sedation): (R) Hip/IT Band BLK.  Future Appointments  Date Time Provider [place]  04/29/2018 12:30 PM Milinda Pointer, MD [facility] None  05/10/2018  1:30 PM Gae Dry, MD [facility] None  03/23/2019  1:30 PM McGowan, Marlowe Kays None   Primary Care Physician: Idelle Crouch, MD Location: [facility] Note by: Gaspar Cola, MD Date: 04/14/2018; Time: 9:18 AM

## 2018-04-14 ENCOUNTER — Ambulatory Visit: Payer: Medicare Other | Attending: Pain Medicine | Admitting: Pain Medicine

## 2018-04-14 ENCOUNTER — Encounter: Payer: Self-pay | Admitting: Pain Medicine

## 2018-04-14 ENCOUNTER — Other Ambulatory Visit: Payer: Self-pay

## 2018-04-14 VITALS — BP 176/69 | HR 72 | Temp 98.2°F | Ht 64.0 in | Wt 103.0 lb

## 2018-04-14 DIAGNOSIS — I129 Hypertensive chronic kidney disease with stage 1 through stage 4 chronic kidney disease, or unspecified chronic kidney disease: Secondary | ICD-10-CM | POA: Diagnosis not present

## 2018-04-14 DIAGNOSIS — Z882 Allergy status to sulfonamides status: Secondary | ICD-10-CM | POA: Insufficient documentation

## 2018-04-14 DIAGNOSIS — M79604 Pain in right leg: Secondary | ICD-10-CM | POA: Diagnosis not present

## 2018-04-14 DIAGNOSIS — M25552 Pain in left hip: Secondary | ICD-10-CM | POA: Insufficient documentation

## 2018-04-14 DIAGNOSIS — G894 Chronic pain syndrome: Secondary | ICD-10-CM | POA: Insufficient documentation

## 2018-04-14 DIAGNOSIS — M4316 Spondylolisthesis, lumbar region: Secondary | ICD-10-CM | POA: Insufficient documentation

## 2018-04-14 DIAGNOSIS — M5116 Intervertebral disc disorders with radiculopathy, lumbar region: Secondary | ICD-10-CM | POA: Insufficient documentation

## 2018-04-14 DIAGNOSIS — Z853 Personal history of malignant neoplasm of breast: Secondary | ICD-10-CM | POA: Diagnosis not present

## 2018-04-14 DIAGNOSIS — Z888 Allergy status to other drugs, medicaments and biological substances status: Secondary | ICD-10-CM | POA: Insufficient documentation

## 2018-04-14 DIAGNOSIS — M25559 Pain in unspecified hip: Secondary | ICD-10-CM | POA: Diagnosis not present

## 2018-04-14 DIAGNOSIS — M5441 Lumbago with sciatica, right side: Secondary | ICD-10-CM | POA: Diagnosis not present

## 2018-04-14 DIAGNOSIS — F482 Pseudobulbar affect: Secondary | ICD-10-CM | POA: Diagnosis not present

## 2018-04-14 DIAGNOSIS — M79605 Pain in left leg: Secondary | ICD-10-CM

## 2018-04-14 DIAGNOSIS — J449 Chronic obstructive pulmonary disease, unspecified: Secondary | ICD-10-CM | POA: Insufficient documentation

## 2018-04-14 DIAGNOSIS — G8929 Other chronic pain: Secondary | ICD-10-CM | POA: Diagnosis not present

## 2018-04-14 DIAGNOSIS — M4310 Spondylolisthesis, site unspecified: Secondary | ICD-10-CM | POA: Diagnosis not present

## 2018-04-14 DIAGNOSIS — Z87891 Personal history of nicotine dependence: Secondary | ICD-10-CM | POA: Insufficient documentation

## 2018-04-14 DIAGNOSIS — M48061 Spinal stenosis, lumbar region without neurogenic claudication: Secondary | ICD-10-CM | POA: Diagnosis not present

## 2018-04-14 DIAGNOSIS — Z9889 Other specified postprocedural states: Secondary | ICD-10-CM | POA: Diagnosis not present

## 2018-04-14 DIAGNOSIS — R208 Other disturbances of skin sensation: Secondary | ICD-10-CM | POA: Diagnosis not present

## 2018-04-14 DIAGNOSIS — M21372 Foot drop, left foot: Secondary | ICD-10-CM | POA: Diagnosis not present

## 2018-04-14 DIAGNOSIS — F319 Bipolar disorder, unspecified: Secondary | ICD-10-CM | POA: Insufficient documentation

## 2018-04-14 DIAGNOSIS — M25551 Pain in right hip: Secondary | ICD-10-CM | POA: Insufficient documentation

## 2018-04-14 DIAGNOSIS — M5136 Other intervertebral disc degeneration, lumbar region: Secondary | ICD-10-CM

## 2018-04-14 DIAGNOSIS — M545 Low back pain: Secondary | ICD-10-CM | POA: Diagnosis present

## 2018-04-14 DIAGNOSIS — Z79899 Other long term (current) drug therapy: Secondary | ICD-10-CM | POA: Insufficient documentation

## 2018-04-14 DIAGNOSIS — G20 Parkinson's disease: Secondary | ICD-10-CM | POA: Diagnosis not present

## 2018-04-14 DIAGNOSIS — M5416 Radiculopathy, lumbar region: Secondary | ICD-10-CM

## 2018-04-14 DIAGNOSIS — M5442 Lumbago with sciatica, left side: Secondary | ICD-10-CM | POA: Diagnosis not present

## 2018-04-14 DIAGNOSIS — N189 Chronic kidney disease, unspecified: Secondary | ICD-10-CM | POA: Insufficient documentation

## 2018-04-14 MED ORDER — LIDOCAINE 5 % EX PTCH: 1.0000 | MEDICATED_PATCH | Freq: Two times a day (BID) | CUTANEOUS | 11 refills | Status: DC

## 2018-04-14 NOTE — Patient Instructions (Signed)
____________________________________________________________________________________________  Preparing for your procedure (without sedation)  Instructions: . Oral Intake: Do not eat or drink anything for at least 3 hours prior to your procedure. . Transportation: Unless otherwise stated by your physician, you may drive yourself after the procedure. . Blood Pressure Medicine: Take your blood pressure medicine with a sip of water the morning of the procedure. . Blood thinners:  . Diabetics on insulin: Notify the staff so that you can be scheduled 1st case in the morning. If your diabetes requires high dose insulin, take only  of your normal insulin dose the morning of the procedure and notify the staff that you have done so. . Preventing infections: Shower with an antibacterial soap the morning of your procedure.  . Build-up your immune system: Take 1000 mg of Vitamin C with every meal (3 times a day) the day prior to your procedure. . Antibiotics: Inform the staff if you have a condition or reason that requires you to take antibiotics before dental procedures. . Pregnancy: If you are pregnant, call and cancel the procedure. . Sickness: If you have a cold, fever, or any active infections, call and cancel the procedure. . Arrival: You must be in the facility at least 30 minutes prior to your scheduled procedure. . Children: Do not bring any children with you. . Dress appropriately: Bring dark clothing that you would not mind if they get stained. . Valuables: Do not bring any jewelry or valuables.  Procedure appointments are reserved for interventional treatments only. . No Prescription Refills. . No medication changes will be discussed during procedure appointments. . No disability issues will be discussed.  Remember:  Regular Business hours are:  Monday to Thursday 8:00 AM to 4:00 PM  Provider's Schedule: Aryahi Denzler, MD:  Procedure days: Tuesday and Thursday 7:30 AM to 4:00  PM  Bilal Lateef, MD:  Procedure days: Monday and Wednesday 7:30 AM to 4:00 PM ____________________________________________________________________________________________    

## 2018-04-29 ENCOUNTER — Ambulatory Visit
Admission: RE | Admit: 2018-04-29 | Discharge: 2018-04-29 | Disposition: A | Discharge status: Home / Self Care | Payer: Medicare Other | Source: Ambulatory Visit | Attending: Pain Medicine | Admitting: Pain Medicine

## 2018-04-29 ENCOUNTER — Ambulatory Visit (HOSPITAL_BASED_OUTPATIENT_CLINIC_OR_DEPARTMENT_OTHER): Payer: Medicare Other | Admitting: Pain Medicine

## 2018-04-29 ENCOUNTER — Encounter: Payer: Self-pay | Admitting: Pain Medicine

## 2018-04-29 ENCOUNTER — Other Ambulatory Visit: Payer: Self-pay

## 2018-04-29 VITALS — BP 160/70 | HR 79 | Temp 97.6°F | Resp 13 | Ht 65.0 in | Wt 105.0 lb

## 2018-04-29 DIAGNOSIS — R29898 Other symptoms and signs involving the musculoskeletal system: Secondary | ICD-10-CM

## 2018-04-29 DIAGNOSIS — Z853 Personal history of malignant neoplasm of breast: Secondary | ICD-10-CM | POA: Diagnosis not present

## 2018-04-29 DIAGNOSIS — G8929 Other chronic pain: Secondary | ICD-10-CM

## 2018-04-29 DIAGNOSIS — M1611 Unilateral primary osteoarthritis, right hip: Secondary | ICD-10-CM | POA: Diagnosis not present

## 2018-04-29 DIAGNOSIS — Z882 Allergy status to sulfonamides status: Secondary | ICD-10-CM | POA: Insufficient documentation

## 2018-04-29 DIAGNOSIS — M79604 Pain in right leg: Secondary | ICD-10-CM

## 2018-04-29 DIAGNOSIS — Z888 Allergy status to other drugs, medicaments and biological substances status: Secondary | ICD-10-CM | POA: Insufficient documentation

## 2018-04-29 DIAGNOSIS — Z9049 Acquired absence of other specified parts of digestive tract: Secondary | ICD-10-CM | POA: Insufficient documentation

## 2018-04-29 DIAGNOSIS — M7631 Iliotibial band syndrome, right leg: Secondary | ICD-10-CM | POA: Diagnosis not present

## 2018-04-29 DIAGNOSIS — M25559 Pain in unspecified hip: Secondary | ICD-10-CM

## 2018-04-29 DIAGNOSIS — M79605 Pain in left leg: Secondary | ICD-10-CM

## 2018-04-29 DIAGNOSIS — M5442 Lumbago with sciatica, left side: Secondary | ICD-10-CM

## 2018-04-29 DIAGNOSIS — M5441 Lumbago with sciatica, right side: Secondary | ICD-10-CM

## 2018-04-29 MED ORDER — IOPAMIDOL (ISOVUE-M 200) INJECTION 41%
10.0000 mL | Freq: Once | INTRAMUSCULAR | Status: AC
  Administered 2018-04-29: 10 mL via EPIDURAL
  Filled 2018-04-29: qty 10

## 2018-04-29 MED ORDER — MIDAZOLAM HCL 5 MG/5ML IJ SOLN
1.0000 mg | INTRAMUSCULAR | Status: DC | PRN
  Filled 2018-04-29: qty 5

## 2018-04-29 MED ORDER — ROPIVACAINE HCL 2 MG/ML IJ SOLN
5.0000 mL | Freq: Once | INTRAMUSCULAR | Status: AC
  Administered 2018-04-29: 10 mL
  Filled 2018-04-29: qty 10

## 2018-04-29 MED ORDER — LIDOCAINE HCL 2 % IJ SOLN
20.0000 mL | Freq: Once | INTRAMUSCULAR | Status: AC
  Administered 2018-04-29: 400 mg
  Filled 2018-04-29: qty 20

## 2018-04-29 MED ORDER — FENTANYL CITRATE (PF) 100 MCG/2ML IJ SOLN
25.0000 ug | INTRAMUSCULAR | Status: DC | PRN
  Filled 2018-04-29: qty 2

## 2018-04-29 MED ORDER — TRIAMCINOLONE ACETONIDE 40 MG/ML IJ SUSP
40.0000 mg | Freq: Once | INTRAMUSCULAR | Status: AC
  Administered 2018-04-29: 40 mg via INTRA_ARTICULAR

## 2018-04-29 MED ORDER — LACTATED RINGERS IV SOLN: 1000.0000 mL | Freq: Once | INTRAVENOUS | Status: DC

## 2018-04-29 MED ORDER — METHYLPREDNISOLONE ACETATE 80 MG/ML IJ SUSP
80.0000 mg | Freq: Once | INTRAMUSCULAR | Status: AC
  Administered 2018-04-29: 80 mg via INTRA_ARTICULAR
  Filled 2018-04-29: qty 1

## 2018-04-29 MED ORDER — ROPIVACAINE HCL 2 MG/ML IJ SOLN
4.0000 mL | Freq: Once | INTRAMUSCULAR | Status: AC
  Administered 2018-04-29: 10 mL via INTRA_ARTICULAR
  Filled 2018-04-29: qty 10

## 2018-04-29 NOTE — Progress Notes (Signed)
Patient's Name: Elaine Clayton  MRN: [id number]  Referring Provider: Idelle Crouch, MD  DOB: 04/01/1946  PCP: Idelle Crouch, MD  DOS: 04/29/2018  Note by: Gaspar Cola, MD  Service setting: Ambulatory outpatient  Specialty: Interventional Pain Management  Patient type: Established  Location: [facility] (AMB) Pain Management Facility  Visit type: Interventional Procedure   Primary Reason for Visit: Interventional Pain Management Treatment. CC: Hip Pain  Procedure #1:  Anesthesia, Analgesia, Anxiolysis:  Type: Intra-Articular Hip Injection #2  Primary Purpose: Therapeutic Region: Posterolateral hip joint area. Level: Lower pelvic and hip joint level. Target Area: Superior aspect of the hip joint cavity, going thru the superior portion of the capsular ligament. Approach: Posterolateral approach. Laterality: Right-Sided Position: Lateral Decubitus with bad side up  Type: Local Anesthesia Indication(s): Analgesia         Route: Infiltration ([place]/IM) IV Access: Declined Sedation: Declined  Local Anesthetic: Lidocaine 1-2%   Indications: 1. Osteoarthritis of hip (Right)   2. Chronic hip pain (Bilateral) (L>R)   3. Chronic lower extremity pain (Secondary Area of Pain) (Bilateral) (L>R)    Procedure #2:  Type: Ligament/Tendon sheath (33295) Injection: Iliotibial band injection. Purpose: Diagnostic Level: Upper leg Laterality: Right-side Position: Left Lateral Decubitus. Target Area: Iliotibial band Region: Lateral aspect of upper leg Approach: Percutanous   Indications: 1. Iliotibial band syndrome (Right)   2. Chronic hip pain (Bilateral) (L>R)   3. Chronic lower extremity pain (Secondary Area of Pain) (Bilateral) (L>R)    Pain Score: Pre-procedure: 7 /10 Post-procedure: 0-No pain/10  Pre-op Assessment:  Elaine Clayton is a 72 y.o. (year old), female patient, seen today for interventional treatment. She  has a past surgical history that includes Colonoscopy (2004); Neck  surgery (2006); Eye surgery (2013); Appendectomy; Tubal ligation; Colonoscopy (N/A, 05/14/2015); Esophagogastroduodenoscopy (N/A, 05/14/2015); Savory dilation (N/A, 05/14/2015); ERCP (N/A); Glaucoma surgery (Right, 2013); cataract (Right, 2013); Breast surgery (Left,  2013); Breast surgery (Right, November 06, 1999); Breast surgery (Left, May 07, 2007); EUS (N/A, 2011, 2012); Laparoscopic right colectomy (Right, 06/22/2015); right breast cancer; Trabeculectomy (Left, 03/05/2016); Cataract extraction w/PHACO (Left, 03/05/2016); Breast biopsy (Right, 2011); Breast excisional biopsy (Right, 2001); Breast excisional biopsy (Right, 2008); and Breast excisional biopsy (Left, 10/26/2012). Elaine Clayton has a current medication list which includes the following prescription(s): alprazolam, azopt, bisacodyl, brimonidine tartrate-timolol, bupropion, dextromethorphan, escitalopram, estradiol-levonorgestrel, fluticasone furoate-vilanterol, hydrocodone-acetaminophen, ipratropium, lumigan, mirabegron er, nicotine polacrilex, ondansetron, rosuvastatin, ventolin hfa, and gabapentin. Her primarily concern today is the Hip Pain  Initial Vital Signs:  Pulse/HCG Rate: 79ECG Heart Rate: 79 Temp: 97.6 F (36.4 C) Resp: 18 BP: (!) 154/92 SpO2: 99 %  BMI: Estimated body mass index is 17.47 kg/m as calculated from the following:   Height as of this encounter: 5\' 5"  (1.651 m).   Weight as of this encounter: 105 lb (47.6 kg).  Risk Assessment: Allergies: Reviewed. She is allergic to influenza vaccines; flu virus vaccine; gabapentin; sulfa antibiotics; and tetracyclines & related.  Allergy Precautions: None required Coagulopathies: Reviewed. None identified.  Blood-thinner therapy: None at this time Active Infection(s): Reviewed. None identified. Elaine Clayton is afebrile  Site Confirmation: Elaine Clayton was asked to confirm the procedure and laterality before marking the site Procedure checklist: Completed Consent: Before the  procedure and under the influence of no sedative(s), amnesic(s), or anxiolytics, the patient was informed of the treatment options, risks and possible complications. To fulfill our ethical and legal obligations, as recommended by the American Medical Association's Code of Ethics, I have informed the patient  of my clinical impression; the nature and purpose of the treatment or procedure; the risks, benefits, and possible complications of the intervention; the alternatives, including doing nothing; the risk(s) and benefit(s) of the alternative treatment(s) or procedure(s); and the risk(s) and benefit(s) of doing nothing. The patient was provided information about the general risks and possible complications associated with the procedure. These may include, but are not limited to: failure to achieve desired goals, infection, bleeding, organ or nerve damage, allergic reactions, paralysis, and death. In addition, the patient was informed of those risks and complications associated to the procedure, such as failure to decrease pain; infection; bleeding; organ or nerve damage with subsequent damage to sensory, motor, and/or autonomic systems, resulting in permanent pain, numbness, and/or weakness of one or several areas of the body; allergic reactions; (i.e.: anaphylactic reaction); and/or death. Furthermore, the patient was informed of those risks and complications associated with the medications. These include, but are not limited to: allergic reactions (i.e.: anaphylactic or anaphylactoid reaction(s)); adrenal axis suppression; blood sugar elevation that in diabetics may result in ketoacidosis or comma; water retention that in patients with history of congestive heart failure may result in shortness of breath, pulmonary edema, and decompensation with resultant heart failure; weight gain; swelling or edema; medication-induced neural toxicity; particulate matter embolism and blood vessel occlusion with resultant organ,  and/or nervous system infarction; and/or aseptic necrosis of one or more joints. Finally, the patient was informed that Medicine is not an exact science; therefore, there is also the possibility of unforeseen or unpredictable risks and/or possible complications that may result in a catastrophic outcome. The patient indicated having understood very clearly. We have given the patient no guarantees and we have made no promises. Enough time was given to the patient to ask questions, all of which were answered to the patient's satisfaction. Elaine Clayton has indicated that she wanted to continue with the procedure. Attestation: I, the ordering provider, attest that I have discussed with the patient the benefits, risks, side-effects, alternatives, likelihood of achieving goals, and potential problems during recovery for the procedure that I have provided informed consent. Date  Time: 04/29/2018 12:30 PM  Pre-Procedure Preparation:  Monitoring: As per clinic protocol. Respiration, ETCO2, SpO2, BP, heart rate and rhythm monitor placed and checked for adequate function Safety Precautions: Patient was assessed for positional comfort and pressure points before starting the procedure. Time-out: I initiated and conducted the "Time-out" before starting the procedure, as per protocol. The patient was asked to participate by confirming the accuracy of the "Time Out" information. Verification of the correct person, site, and procedure were performed and confirmed by me, the nursing staff, and the patient. "Time-out" conducted as per Joint Commission's Universal Protocol (UP.01.01.01). Time: 1322  Description of Procedure #1:   Area Prepped: Entire Posterolateral hip area. Prepping solution: ChloraPrep (2% chlorhexidine gluconate and 70% isopropyl alcohol) Safety Precautions: Aspiration looking for blood return was conducted prior to all injections. At no point did we inject any substances, as a needle was being advanced.  No attempts were made at seeking any paresthesias. Safe injection practices and needle disposal techniques used. Medications properly checked for expiration dates. SDV (single dose vial) medications used. Description of the Procedure: Protocol guidelines were followed. The patient was placed in position over the fluoroscopy table. The target area was identified and the area prepped in the usual manner. Skin & deeper tissues infiltrated with local anesthetic. Appropriate amount of time allowed to pass for local anesthetics to take effect. The procedure  needles were then advanced to the target area. Proper needle placement secured. Negative aspiration confirmed. Solution injected in intermittent fashion, asking for systemic symptoms every 0.5cc of injectate. The needles were then removed and the area cleansed, making sure to leave some of the prepping solution back to take advantage of its long term bactericidal properties.  Start Time: 1322 hrs.  Materials:  Needle(s) Type: Regular needle Gauge: 22G Length: 3.5-in Medication(s): Please see orders for medications and dosing details.  Imaging Guidance for procedure #1 (Non-Spinal):  Type of Imaging Technique: Fluoroscopy Guidance (Non-Spinal) Indication(s): Assistance in needle guidance and placement for procedures requiring needle placement in or near specific anatomical locations not easily accessible without such assistance. Exposure Time: Please see nurses notes. Contrast: Before injecting any contrast, we confirmed that the patient did not have an allergy to iodine, shellfish, or radiological contrast. Once satisfactory needle placement was completed at the desired level, radiological contrast was injected. Contrast injected under live fluoroscopy. No contrast complications. See chart for type and volume of contrast used. Fluoroscopic Guidance: I was personally present during the use of fluoroscopy. "Tunnel Vision Technique" used to obtain the best  possible view of the target area. Parallax error corrected before commencing the procedure. "Direction-depth-direction" technique used to introduce the needle under continuous pulsed fluoroscopy. Once target was reached, antero-posterior, oblique, and lateral fluoroscopic projection used confirm needle placement in all planes. Images permanently stored in EMR. Interpretation: I personally interpreted the imaging intraoperatively. Adequate needle placement confirmed in multiple planes. Appropriate spread of contrast into desired area was observed. No evidence of afferent or efferent intravascular uptake. Permanent images saved into the patient's record.  Description of Procedure #2:   Area Prepped: Entire lateral portion of the thigh down to mid calf area. Prepping solution: ChloraPrep (2% chlorhexidine gluconate and 70% isopropyl alcohol) Safety Precautions: Aspiration looking for blood return was conducted prior to all injections. At no point did we inject any substances, as a needle was being advanced. No attempts were made at seeking any paresthesias. Safe injection practices and needle disposal techniques used. Medications properly checked for expiration dates. SDV (single dose vial) medications used. Description of the Procedure: Protocol guidelines were followed. The patient was placed in position. The target area was identified and prepped in the usual manner. Skin & deeper tissues infiltrated with local anesthetic. Appropriate time provided for local anesthetics to take effect. The procedure needle was slowly advanced to target area. Proper needle placement secured. Negative aspiration confirmed. Solution injected in intermittent fashion, asking for systemic symptoms every 0.5cc. Needle(s) removed and area cleaned, making sure to leave some prepping solution back to take advantage of its long term bactericidal properties.  Vitals:   04/29/18 1326 04/29/18 1330 04/29/18 1335 04/29/18 1340  BP: (!)  168/70 (!) 162/70 (!) 167/81 (!) 160/70  Pulse:      Resp: 10 11 10 13   Temp:      TempSrc:      SpO2: 96% 98% 98% 98%  Weight:      Height:       End Time: 1338 hrs. Materials:  Needle(s) Type: Epidural needle Gauge: 25G Length: 1.5-in Medication(s): Please see orders for medications and dosing details.  Imaging Guidance for procedure #2:  Type of Imaging Technique: None used Indication(s): N/A Exposure Time: No patient exposure Contrast: None used. Fluoroscopic Guidance: N/A Ultrasound Guidance: N/A Interpretation: N/A  Post-operative Assessment:  Post-procedure Vital Signs:  Pulse/HCG Rate: 7978 Temp: 97.6 F (36.4 C) Resp: 13 BP: (!) 160/70  SpO2: 98 %  EBL: None  Complications: No immediate post-treatment complications observed by team, or reported by patient.  Note: The patient tolerated the entire procedure well. A repeat set of vitals were taken after the procedure and the patient was kept under observation following institutional policy, for this type of procedure. Post-procedural neurological assessment was performed, showing return to baseline, prior to discharge. The patient was provided with post-procedure discharge instructions, including a section on how to identify potential problems. Should any problems arise concerning this procedure, the patient was given instructions to immediately contact us, at any time, without hesitation. In any case, we plan to contact the patient by telephone for a follow-up status report regarding this interventional procedure.  Comments:  No additional relevant information.  Plan of Care   Imaging Orders     DG C-Arm 1-60 Min-No Report  Procedure Orders     HIP INJECTION     Injection tendon or ligament  Medications ordered for procedure: Meds ordered this encounter  Medications  . lidocaine (XYLOCAINE) 2 % (with pres) injection 400 mg  . ropivacaine (PF) 2 mg/mL (0.2%) (NAROPIN) injection 4 mL  . methylPREDNISolone  acetate (DEPO-MEDROL) injection 80 mg  . iopamidol (ISOVUE-M) 41 % intrathecal injection 10 mL    Must be Myelogram-compatible. If not available, you may substitute with a water-soluble, non-ionic, hypoallergenic, myelogram-compatible radiological contrast medium.  Marland Kitchen triamcinolone acetonide (KENALOG-40) injection 40 mg  . ropivacaine (PF) 2 mg/mL (0.2%) (NAROPIN) injection 5 mL   Medications administered: We administered lidocaine, ropivacaine (PF) 2 mg/mL (0.2%), methylPREDNISolone acetate, iopamidol, triamcinolone acetonide, and ropivacaine (PF) 2 mg/mL (0.2%).  See the medical record for exact dosing, route, and time of administration.  New Prescriptions   No medications on file   Disposition: Discharge home  Discharge Date & Time: 04/29/2018; 1345 hrs.   Physician-requested Follow-up: Return for post-procedure eval (2 wks), w/ Dr. Dossie Arbour.  Future Appointments  Date Time Provider [place]  05/06/2018 11:00 AM Hillis Range, PT [facility] None  05/10/2018  1:30 PM Gae Dry, MD [facility] None  05/24/2018 11:15 AM Milinda Pointer, MD [facility] None  03/23/2019  1:30 PM Ernestine Conrad, Marlowe Kays None   Primary Care Physician: Idelle Crouch, MD Location: [facility] Note by: Gaspar Cola, MD Date: 04/29/2018; Time: 3:35 PM  Disclaimer:  Medicine is not an Chief Strategy Officer. The only guarantee in medicine is that nothing is guaranteed. It is important to note that the decision to proceed with this intervention was based on the information collected from the patient. The Data and conclusions were drawn from the patient's questionnaire, the interview, and the physical examination. Because the information was provided in large part by the patient, it cannot be guaranteed that it has not been purposely or unconsciously manipulated. Every effort has been made to obtain as much relevant data as possible for this evaluation. It is important  to note that the conclusions that lead to this procedure are derived in large part from the available data. Always take into account that the treatment will also be dependent on availability of resources and existing treatment guidelines, considered by other Pain Management Practitioners as being common knowledge and practice, at the time of the intervention. For Medico-Legal purposes, it is also important to point out that variation in procedural techniques and pharmacological choices are the acceptable norm. The indications, contraindications, technique, and results of the above procedure should only be interpreted and judged by a  Board-Certified Interventional Pain Specialist with extensive familiarity and expertise in the same exact procedure and technique.

## 2018-04-29 NOTE — Patient Instructions (Signed)

## 2018-04-29 NOTE — Progress Notes (Signed)
Safety precautions to be maintained throughout the outpatient stay will include: orient to surroundings, keep bed in low position, maintain call bell within reach at all times, provide assistance with transfer out of bed and ambulation.  

## 2018-04-30 ENCOUNTER — Telehealth: Payer: Self-pay

## 2018-04-30 NOTE — Telephone Encounter (Signed)
Post procedure phone call.  Patient states she is doing well.  

## 2018-05-06 ENCOUNTER — Ambulatory Visit: Payer: Medicare Other | Attending: Pain Medicine

## 2018-05-06 DIAGNOSIS — R2681 Unsteadiness on feet: Secondary | ICD-10-CM | POA: Insufficient documentation

## 2018-05-06 DIAGNOSIS — R2689 Other abnormalities of gait and mobility: Secondary | ICD-10-CM | POA: Insufficient documentation

## 2018-05-06 DIAGNOSIS — M6281 Muscle weakness (generalized): Secondary | ICD-10-CM | POA: Insufficient documentation

## 2018-05-06 DIAGNOSIS — R278 Other lack of coordination: Secondary | ICD-10-CM | POA: Insufficient documentation

## 2018-05-06 DIAGNOSIS — R296 Repeated falls: Secondary | ICD-10-CM | POA: Diagnosis present

## 2018-05-07 NOTE — Therapy (Signed)
[name] [facility] SERVICES [address] [place], [place], [address] Phone: [phone]   Fax:  [phone]  Physical Therapy Evaluation  Patient Details  Name: Elaine Clayton MRN: [id number] Date of Birth: [birth year] Referring Provider: Dr. Dossie Arbour   Encounter Date: 05/06/2018  PT End of Session - 05/06/18 1124    Visit Number  1    Number of Visits  13    Date for PT Re-Evaluation  07/29/18    Authorization Type  progress note 1/10    Authorization Time Period  last goals: 05/06/18    PT Start Time  1105    PT Stop Time  1200    PT Time Calculation (min)  55 min    Equipment Utilized During Treatment  Gait belt    Activity Tolerance  Patient tolerated treatment well;No increased pain    Behavior During Therapy  WFL for tasks assessed/performed       Past Medical History:  Diagnosis Date  . Anemia   . Ataxia   . Bipolar affective disorder ([place])    2  . Broken foot 2015   right foot  . Chronic gastritis   . Chronic kidney disease    Dr Holley Raring  . COPD (chronic obstructive pulmonary disease) ([place])   . Dizziness    inner ear (per pt) several times per week  . Dysphagia   . Glaucoma   . Hyperlipemia   . Hypertension    pt has recently come off BP meds.  MD aware. BP seems stable.  . Lithium toxicity 2015  . 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 Neck stiffness    s/p C3-C4 fusion, limited right turn  . Osteoarthritis    back  . Pancreatitis   . Parkinson disease ([place])   . Personal history of malignant neoplasm of breast   . Pituitary microadenoma ([place])   . Recurrent UTI   . Renal insufficiency   . Stomach ulcer 2112    Past Surgical History:  Procedure Laterality Date  . APPENDECTOMY    . BREAST BIOPSY Right 2011   neg  . BREAST EXCISIONAL BIOPSY Right 2001   neg  . BREAST EXCISIONAL BIOPSY Right 2008   breast ca 2008 radiation  .  BREAST EXCISIONAL BIOPSY Left 10/26/2012   neg  . BREAST SURGERY Left  2013   left breast wide excision,Intraductal papilloma, ductal hyperplasia and sclerosing adenosis. Microcalcifications associated with columnar cell change. No evidence of atypia or malignancy. Margins are unremarkable.  Marland Kitchen BREAST SURGERY Right November 06, 1999   multiple areas of microcalcification showing evidence of sclerosing adenosis and ductal hyperplasia.  Marland Kitchen BREAST SURGERY Left May 07, 2007   wide excision.  . cataract Right 2013  . CATARACT EXTRACTION W/PHACO Left 03/05/2016   Procedure: CATARACT EXTRACTION PHACO AND INTRAOCULAR LENS PLACEMENT (IOC);  Surgeon: Leandrew Koyanagi, MD;  Location: [place];  Service: Ophthalmology;  Laterality: Left;  . COLONOSCOPY  2004  . COLONOSCOPY N/A 05/14/2015   Procedure: COLONOSCOPY;  Surgeon: Manya Silvas, MD;  [facility] ENDOSCOPY;  Service: Endoscopy;  Laterality: N/A;  . ERCP N/A   . ESOPHAGOGASTRODUODENOSCOPY N/A 05/14/2015   Procedure: ESOPHAGOGASTRODUODENOSCOPY (EGD);  Surgeon: Manya Silvas, MD;  Location: [facility] ENDOSCOPY;  Service: Endoscopy;  Laterality: N/A;  . EUS N/A 2011, 2012  . EYE SURGERY  2013  . GLAUCOMA SURGERY Right 2013  .  LAPAROSCOPIC RIGHT COLECTOMY Right 06/22/2015   Procedure: LAPAROSCOPIC RIGHT COLECTOMY;  Surgeon: Robert Bellow, MD;  Location: [facility] ORS;  Service: General;  Laterality: Right;  . NECK SURGERY  2006  . right breast cancer    . SAVORY DILATION N/A 05/14/2015   Procedure: SAVORY DILATION;  Surgeon: Manya Silvas, MD;  Location: [facility] ENDOSCOPY;  Service: Endoscopy;  Laterality: N/A;  . TRABECULECTOMY Left 03/05/2016   Procedure: TRABECULECTOMY WITH [facility] AND [employer] [facility];  Surgeon: Leandrew Koyanagi, MD;  Location: [place];  Service: Ophthalmology;  Laterality: Left;  . TUBAL LIGATION      There were no vitals filed for this visit.   Subjective Assessment - 05/06/18 1122    Subjective   Balance and LE weakness    Pertinent History  Pt is known to therapy clinic from prior episode. She underwent PT from 08/04/18-04/06/18 including aquatic and land based exercise for a total of 33 visits. She was not compliant during her last therapy episode with performing her home exercise program. At the end of her episode of care her outcome measures were worse than when she started. She recently saw Dr. Dossie Arbour for intraarticular hip injection and IT band injection. She feels like her balance has worsened since ending therapy. At end of last therapy episode pt was provided with an extensive list of community resources however she did not pursue any community based exercise program. She is moving to [employer] sometime in [place] in [place] where she will have access to a pool. Pt with extensive surgical and medical Hx including breast cancer (last surgery 2013), DJD with cervical fusion C3-5 in 2006, bipolar disorder, HTN, OA in bilateral shoulders L>R, hip and SIJ pain x >20 years. She states that she needs the accountability of therapy for her to continue her exercises and wants to start therapy again.     Limitations  Lifting;Standing;Walking;House hold activities    How long can you sit comfortably?  Unlimited    How long can you stand comfortably?  --    How long can you walk comfortably?  --    Diagnostic tests  MRI 05/20/17: Chronic lumbar scoliosis with mild grade 1 spondylolisthesis from L3 L4 to L5-S1. Advanced disc and facet degeneration at those levels. Mild to moderate spinal and foraminal stenosis at L3-L4 and L4-L5. Right L5 radiculitis. Moderate neural foraminal stenosis also at the left L1 and both L2 nerve levels.    Patient Stated Goals  To stop falling    Currently in Pain?  No/denies         [facility] PT Assessment - 05/07/18 1229      Assessment   Medical Diagnosis  Balance deficits, bilateral LE pain    Referring Provider  Dr. Dossie Arbour    Onset  Date/Surgical Date  -- Years    Hand Dominance  Right    Next MD Visit  --    Prior Therapy  Pt was recently seen for 8 months for therapy      Precautions   Precautions  Fall    Precaution Comments  --      Restrictions   Weight Bearing Restrictions  No      Balance Screen   Has the patient fallen in the past 6 months  Yes    How many times?  -- >10 falls in the last 4 weeks    Has the patient had a decrease in activity level because of a fear of  falling?   Yes    Is the patient reluctant to leave their home because of a fear of falling?   No      Home Film/video editor residence    Living Arrangements  Spouse/significant other    Type of Cutter to enter    Entrance Stairs-Number of Steps  13    Entrance Stairs-Rails  Right;Left;Can reach both    [place]  Two level    Additional Comments  Pt reports she has issues going down the stairs      Prior Function   Level of Independence  Independent with basic ADLs    Vocation  Retired      Associate Professor   Overall Cognitive Status  No family/caregiver present to determine baseline cognitive functioning    Memory  Impaired    Memory Impairment  Decreased recall of new information    Executive Function  Decision Making    Decision Making  Impaired    Decision Making Impairment  --    Behaviors  Impulsive      Observation/Other Assessments   Oswestry Disability Index   --      Posture/Postural Control   Posture Comments  Forward head, flattened lower back, rounded shoulders      Strength   Strength Assessment Site  Hip;Knee;Ankle    Right/Left Hip  Right;Left    Right Hip Flexion  4-/5    Right Hip ABduction  4-/5    Right Hip ADduction  4-/5    Left Hip Flexion  4-/5    Left Hip ABduction  4-/5    Left Hip ADduction  4-/5    Right/Left Knee  Right;Left    Right Knee Flexion  4+/5    Right Knee Extension  5/5    Left Knee Flexion  4+/5    Left Knee Extension  5/5     Right/Left Ankle  Right;Left    Right Ankle Dorsiflexion  4-/5    Right Ankle Plantar Flexion  --    Left Ankle Dorsiflexion  4-/5    Left Ankle Plantar Flexion  --      Palpation   Palpation comment  Deferred      Balance   Balance Assessed  Yes      Standardized Balance Assessment   Standardized Balance Assessment  Berg Balance Test    Five times sit to stand comments   Unable to perform without UE or with single UE support. With bilateral UE support pt completes in 22.2s    10 Meter Walk  0.64 m/s self selected, 1.18 m/s fastest      [employer]   Sit to Stand  Needs minimal aid to stand or to stabilize    Standing Unsupported  Able to stand 2 minutes with supervision    Sitting with Back Unsupported but Feet Supported on Floor or Stool  Able to sit safely and securely 2 minutes    Stand to Sit  Sits safely with minimal use of hands    Transfers  Able to transfer safely, definite need of hands    Standing Unsupported with Eyes Closed  Able to stand 10 seconds with supervision    Standing Ubsupported with Feet Together  Needs help to attain position and unable to hold for 15 seconds    From Standing, Reach Forward with Outstretched Arm  Can reach forward >12 cm safely (5")  From Standing Position, Pick up Object from [place] to pick up shoe, needs supervision    From Standing Position, Turn to Look Behind Over each Shoulder  Needs assist to keep from losing balance and falling    Turn 360 Degrees  Needs assistance while turning    Standing Unsupported, Alternately Place Feet on Step/Stool  Needs assistance to keep from falling or unable to try    Standing Unsupported, One Foot in [place] balance while stepping or standing    Standing on One Leg  Unable to try or needs assist to prevent fall    Total Score  24      Functional Gait  Assessment   Gait assessed   Yes    Gait Level Surface  Walks 20 ft, slow speed, abnormal gait pattern, evidence for imbalance or  deviates 10-15 in outside of the 12 in walkway width. Requires more than 7 sec to ambulate 20 ft.    Change in Gait Speed  Makes only minor adjustments to walking speed, or accomplishes a change in speed with significant gait deviations, deviates 10-15 in outside the 12 in walkway width, or changes speed but loses balance but is able to recover and continue walking.    Gait with Horizontal Head Turns  Performs head turns with moderate changes in gait velocity, slows down, deviates 10-15 in outside 12 in walkway width but recovers, can continue to walk.    Gait with Vertical Head Turns  Performs task with moderate change in gait velocity, slows down, deviates 10-15 in outside 12 in walkway width but recovers, can continue to walk.    Gait and Pivot Turn  Turns slowly, requires verbal cueing, or requires several small steps to catch balance following turn and stop    Step Over Obstacle  Is able to step over one shoe box (4.5 in total height) but must slow down and adjust steps to clear box safely. May require verbal cueing.    Gait with Narrow Base of Support  Ambulates less than 4 steps heel to toe or cannot perform without assistance.    Gait with Eyes Closed  Walks 20 ft, slow speed, abnormal gait pattern, evidence for imbalance, deviates 10-15 in outside 12 in walkway width. Requires more than 9 sec to ambulate 20 ft.    Ambulating Backwards  Cannot walk 20 ft without assistance, severe gait deviations or imbalance, deviates greater than 15 in outside 12 in walkway width or will not attempt task.    Steps  Alternating feet, must use rail.    Total Score  9                Objective measurements completed on examination: See above findings.              PT Education - 05/06/18 1123    Education provided  Yes    Education Details  plan of care, aquatic exercise handout, information regarding local gyms/pools    Person(s) Educated  Patient    Methods  Explanation;Handout     Comprehension  Verbalized understanding       PT Short Term Goals - 05/06/18 1124      PT SHORT TERM GOAL #1   Title  Pt will be independent with land and aquatic HEP in order to improve strength and balance in order to decrease fall risk and improve function at home and work.     Baseline  05/06/18: not performing consistently  Time  6    Period  Weeks    Status  New    Target Date  06/17/18      PT SHORT TERM GOAL #2   Title  Pt will appropriately use rollator in order to decrease fall risk and increase community access.     Baseline  Pt has a rollator but doesn't use consistently    Time  12    Period  Weeks    Status  New    Target Date  06/17/18        PT Long Term Goals - 05/06/18 1126      PT LONG TERM GOAL #1   Title  Patient will increase Functional Gait Assessment score to >20/30 as to reduce fall risk and improve dynamic gait safety with community ambulation.    Baseline  05/07/18: 9/30    Time  12    Period  Weeks    Status  New    Target Date  07/29/18      PT LONG TERM GOAL #2   Title  Patient will increase Berg Balance score by > 6 points to demonstrate decreased fall risk during functional activities.; Deferred as patient's balance is more impaired dynamically and therefore FGA goal more appropriate;     Baseline  05/06/18: 22/56    Time  12    Period  Weeks    Status  New    Target Date  07/29/18      PT LONG TERM GOAL #3   Title  Patient (> 63 years old) will complete five times sit to stand test in < 15 seconds indicating an increased LE strength and improved balance.    Baseline  05/06/18: unable to perform without bilateral UE support    Time  12    Period  Weeks    Status  New    Target Date  07/29/18      PT LONG TERM GOAL #4   Title  Patient will ascend/descend 4 stairs without rail assist independently without loss of balance to improve ability to get in/out of home.     Baseline  05/07/18: able to negotiate reciprocally but requires rail  assist    Time  12    Period  Weeks    Status  New    Target Date  07/29/18      PT LONG TERM GOAL #5   Title  --    Baseline  --    Time  --    Period  --    Status  --      PT LONG TERM GOAL #6   Title  --    Baseline  --    Time  --    Period  --    Status  --             Plan - 05/06/18 1124    Clinical Impression Statement  Pt is known to therapy clinic from prior episode. She underwent PT from 08/04/18-04/06/18 including aquatic and land based exercise for a total of 33 visits. She was not compliant during her last therapy episode with performing her home exercise program. At the end of her last episode of care her outcome measures were worse than when she started. She was provided with a written home exercise program at the end of her last episode which she appears to have lost. She states that she has continued to perform some of her exercises at home  but is unable to recall the exercises. At end of last therapy episode pt was provided with an extensive list of community resources however she did not pursue any community-based exercise program. PT evaluation today reveals notable worsening of her balance and strength over the last 4 weeks. Her BERG went from 44/56 to 24/56 today. She was able to complete Five Time Sit to Stand test in 18.5s without any upper extremity support 4 weeks ago and today is unable to perform a single transfer from sitting to standing without bilateral UE assist. Her self-selected walking speed has decreased from 0.71 m/s to 0.64 m/s today. She continues to demonstrate poor insight and judgement with respect to assistive device selection. Pt advised that she should be using a walker or rollator at all times. Pt was again provided with addresses, phone numbers, and hours for 4 different local gyms, three of which have pools. She will be seen once/week with physical therapy as part of a maintenance program but was instructed that she must engage in exercise  outside of therapy. Pt will benefit from maintenance PT services to address deficits in strength, balance, and mobility in order to return to preserve function at home.    History and Personal Factors relevant to plan of care:  age, chronicity, poor safety awareness, memory impairment    Clinical Presentation  Unstable    Clinical Decision Making  Moderate    Rehab Potential  Poor    Clinical Impairments Affecting Rehab Potential  Pt does not use AD and had decreased knowledge of her limitations, poor insight, poor compliance with home program    PT Frequency  1x / week    PT Duration  12 weeks    PT Treatment/Interventions  ADLs/Self Care Home Management;Aquatic Therapy;Biofeedback;Canalith Repostioning;Cryotherapy;Moist Heat;Cognitive remediation;Neuromuscular re-education;Balance training;Therapeutic exercise;Therapeutic activities;Functional mobility training;Stair training;Gait training;DME Instruction;Patient/family education;Orthotic Fit/Training;Manual techniques;Passive range of motion;Visual/perceptual remediation/compensation;Vestibular;Energy conservation;Dry needling    PT Next Visit Plan  Provide extensive written HEP, continue balance and strengthening     PT Home Exercise Plan  Extensive aquatic exercises provided (see pt information), will issue land based HEP at next session    Consulted and Agree with Plan of Care  Patient       Patient will benefit from skilled therapeutic intervention in order to improve the following deficits and impairments:  Abnormal gait, Decreased balance, Decreased safety awareness, Decreased knowledge of use of DME, Decreased knowledge of precautions, Decreased endurance, Decreased strength, Difficulty walking, Postural dysfunction, Pain, Decreased cognition, Decreased coordination  Visit Diagnosis: Unsteadiness on feet - Plan: PT plan of care cert/re-cert  Repeated falls - Plan: PT plan of care cert/re-cert  Muscle weakness (generalized) - Plan: PT  plan of care cert/re-cert     Problem List Patient Active Problem List   Diagnosis Date Noted  . Osteoarthritis of hip (Right) 04/29/2018  . Iliotibial band syndrome (Right) 04/29/2018  . Allodynia 04/14/2018  . Spondylosis without myelopathy or radiculopathy, lumbosacral region 03/11/2018  . Other specified dorsopathies, sacral and sacrococcygeal region 03/11/2018  . Ataxic gait 11/20/2017  . Gait instability 11/20/2017  . 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 Cognitive decline 11/20/2017  . PBA (pseudobulbar affect) 11/20/2017  . Truncal ataxia 11/20/2017  . Lumbar facet syndrome (Bilateral) (L>R) 07/29/2017  . Chronic sacroiliac joint pain (Bilateral) (L>R) 07/29/2017  . Chronic hip pain (Bilateral) (L>R) 07/29/2017  . Chronic lower extremity pain (Secondary Area of Pain) (Bilateral) (L>R) 07/29/2017  . Impairment of balance 07/07/2017  . Menopausal and female climacteric  states 06/15/2017  . Lumbar foraminal stenosis (T12-S1, multilevel) 06/10/2017  . Lumbar lateral recess stenosis (left L2-3, bilateral L3-4, & right L4-5) 06/10/2017  . Grade 1 Anterolisthesis of L4 over L5 and L5 over S1 06/10/2017  . Lumbar facet hypertrophy (multilevel) 06/10/2017  . Lumbar Ligamentum flavum hypertrophy (HCC) (multilevel) 06/10/2017  . Dropfoot (Left) (L5 radiculopathy) 06/10/2017  . Neurogenic pain 06/09/2017  . DDD (degenerative disc disease), lumbar 06/09/2017  . 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 Closed fracture of metatarsal bone 05/06/2017  . 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 Abnormal MRI, cervical spine 05/06/2017  . 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[place]) 05/06/2017  . Cervical radiculitis 05/06/2017  . Chronic lumbar radiculopathy (Bilateral) (L>R) (left L5) 05/06/2017  . Chronic low back pain (Primary Area of Pain) (Bilateral) (L>R) 05/06/2017  .  Abnormal MRI, lumbar spine 05/06/2017  . Chronic pain syndrome 03/19/2017  . Chronic shoulder pain (Fourth Area of Pain) (Bilateral) (L>R) 03/19/2017  . Osteoarthritis of shoulder (Bilateral) 03/19/2017  . Chronic neck pain [facility] Area of Pain) (Bilateral) (L>R) 03/19/2017  . History of C3-5 ACDF 03/19/2017  . Numbness and tingling in left upper extremity 03/19/2017  . Numbness and tingling of right upper extremity 03/19/2017  . Upper extremity weakness (Bilateral) (L>R) 03/19/2017  . Numbness of upper extremity (Bilateral) (L>R) 03/19/2017  . Numbness and tingling of lower extremity (Bilateral) (L>R) 03/19/2017  . Weakness of lower extremity (Bilateral) (R>L) 03/19/2017  . Chronic abdominal pain (epigastric) 03/19/2017  . Pituitary microadenoma ([place]) 12/08/2016  . History of breast cancer in adulthood 12/08/2016  . Dysmetria 12/08/2016  . Loss of weight 08/15/2015  . Colon polyp 05/25/2015  . Personal history of malignant neoplasm of breast 07/18/2014  . Breast cancer ([place]) 06/28/2014  . Bipolar disorder ([place]) 06/28/2014  . Chronic pancreatitis ([place]) 06/28/2014  . HTN (hypertension) 06/28/2014  . OA (osteoarthritis) 06/28/2014  . Renal insufficiency 06/28/2014  . DDD (degenerative disc disease), cervical 06/28/2014  . Hyperlipidemia 06/28/2014  . COPD (chronic obstructive pulmonary disease) ([place]) 05/17/2014  . Breast cancer of upper-outer quadrant of right female breast ([place]) 05/31/2008   Phillips Grout PT, DPT   , 05/07/2018, 1:00 PM  [place] MAIN [facility] SERVICES [address] [place], [place], [address] Phone: [phone]   Fax:  [phone]  Name: Elaine Clayton MRN: [id number] Date of Birth: 07-23-1946

## 2018-05-10 ENCOUNTER — Ambulatory Visit (INDEPENDENT_AMBULATORY_CARE_PROVIDER_SITE_OTHER): Payer: Medicare Other | Admitting: Obstetrics & Gynecology

## 2018-05-10 ENCOUNTER — Encounter: Payer: Self-pay | Admitting: Obstetrics & Gynecology

## 2018-05-10 ENCOUNTER — Other Ambulatory Visit: Payer: Self-pay

## 2018-05-10 VITALS — BP 130/80 | Ht 65.0 in | Wt 104.0 lb

## 2018-05-10 DIAGNOSIS — Z0000 Encounter for general adult medical examination without abnormal findings: Secondary | ICD-10-CM

## 2018-05-10 DIAGNOSIS — Z1231 Encounter for screening mammogram for malignant neoplasm of breast: Secondary | ICD-10-CM

## 2018-05-10 DIAGNOSIS — Z124 Encounter for screening for malignant neoplasm of cervix: Secondary | ICD-10-CM | POA: Diagnosis not present

## 2018-05-10 DIAGNOSIS — Z1211 Encounter for screening for malignant neoplasm of colon: Secondary | ICD-10-CM

## 2018-05-10 DIAGNOSIS — N951 Menopausal and female climacteric states: Secondary | ICD-10-CM

## 2018-05-10 DIAGNOSIS — Z1239 Encounter for other screening for malignant neoplasm of breast: Secondary | ICD-10-CM

## 2018-05-10 DIAGNOSIS — R296 Repeated falls: Secondary | ICD-10-CM | POA: Diagnosis not present

## 2018-05-10 MED ORDER — ESTRADIOL-LEVONORGESTREL 0.045-0.015 MG/DAY TD PTWK: MEDICATED_PATCH | TRANSDERMAL | 4 refills | Status: DC

## 2018-05-10 NOTE — Patient Instructions (Signed)
PAP every three years Mammogram every year    Call [phone] to schedule at [facility], along w Bone Scan Colonoscopy every 10 years Labs yearly (with PCP) Bone scan soon

## 2018-05-10 NOTE — Progress Notes (Signed)
HPI:      [name] is a 72 y.o. G1P1001 who LMP was in the past, she presents today for her annual examination.  The patient has no complaints today. The patient is not sexually active. Herlast pap: was normal and last mammogram: approximate date 2018 and was normal.  The patient does perform self breast exams.  There is no notable family history of breast or ovarian cancer in her family. The patient is taking hormone replacement therapy. Patient denies post-menopausal vaginal bleeding.   The patient has regular exercise: yes. The patient denies current symptoms of depression.  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  GYN Hx: Last Colonoscopy:3 years ago. Normal.  Last DEXA: 3 years ago.    PMHx: Past Medical History:  Diagnosis Date  . Anemia   . Ataxia   . Bipolar affective disorder (Elaine Clayton)    2  . Broken foot 2015   right foot  . Chronic gastritis   . Chronic kidney disease    Dr Elaine Clayton  . COPD (chronic obstructive pulmonary disease) (Elaine Clayton)   . Dizziness    inner ear (per pt) several times per week  . Dysphagia   . Glaucoma   . Hyperlipemia   . Hypertension    pt has recently come off BP meds.  MD aware. BP seems stable.  . Lithium toxicity 2015  . 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 Neck stiffness    s/p C3-C4 fusion, limited right turn  . Osteoarthritis    back  . Pancreatitis   . Parkinson disease ([place])   . Personal history of malignant neoplasm of breast   . Pituitary microadenoma (Elaine Clayton)   . Recurrent UTI   . Renal insufficiency   . Stomach ulcer 2112   Past Surgical History:  Procedure Laterality Date  . APPENDECTOMY    . BREAST BIOPSY Right 2011   neg  . BREAST EXCISIONAL BIOPSY Right 2001   neg  . BREAST EXCISIONAL BIOPSY Right 2008   breast ca 2008 radiation  . BREAST EXCISIONAL BIOPSY Left  10/26/2012   neg  . BREAST SURGERY Left  2013   left breast wide excision,Intraductal papilloma, ductal hyperplasia and sclerosing adenosis. Microcalcifications associated with columnar cell change. No evidence of atypia or malignancy. Margins are unremarkable.  [facility] Kitchen BREAST SURGERY Right November 06, 1999   multiple areas of microcalcification showing evidence of sclerosing adenosis and ductal hyperplasia.  Marland Kitchen BREAST SURGERY Left May 07, 2007   wide excision.  . cataract Right 2013  . CATARACT EXTRACTION W/PHACO Left 03/05/2016   Procedure: CATARACT EXTRACTION PHACO AND INTRAOCULAR LENS PLACEMENT (IOC);  Surgeon: Leandrew Koyanagi, MD;  Location: [facility];  Service: Ophthalmology;  Laterality: Left;  . COLONOSCOPY  2004  . COLONOSCOPY N/A 05/14/2015   Procedure: COLONOSCOPY;  Surgeon: Manya Silvas, MD;  Location: [facility] ENDOSCOPY;  Service: Endoscopy;  Laterality: N/A;  . ERCP N/A   . ESOPHAGOGASTRODUODENOSCOPY N/A 05/14/2015   Procedure: ESOPHAGOGASTRODUODENOSCOPY (EGD);  Surgeon: Manya Silvas, MD;  Location[facility] ENDOSCOPY;  Service: Endoscopy;  Laterality: N/A;  . EUS N/A 2011, 2012  . EYE SURGERY  2013  . GLAUCOMA SURGERY Right 2013  . LAPAROSCOPIC RIGHT COLECTOMY Right 06/22/2015   Procedure: LAPAROSCOPIC RIGHT COLECTOMY;  Surgeon: Robert Bellow, MD;  Location: [facility] ORS;  Service: General;  Laterality: Right;  .  NECK SURGERY  2006  . right breast cancer    . SAVORY DILATION N/A 05/14/2015   Procedure: SAVORY DILATION;  Surgeon: Manya Silvas, MD;  Location: [facility] ENDOSCOPY;  Service: Endoscopy;  Laterality: N/A;  . TRABECULECTOMY Left 03/05/2016   Procedure: TRABECULECTOMY WITH [facility] AND [facility] SHUNT;  Surgeon: Leandrew Koyanagi, MD;  Location: [place];  Service: Ophthalmology;  Laterality: Left;  . TUBAL LIGATION     Family History  Problem Relation Age of Onset  . Breast cancer Mother 40  . Kidney cancer Neg Hx   . Bladder Cancer Neg Hx    Social  History   Tobacco Use  . Smoking status: Former Smoker    Packs/day: 1.00    Years: 35.00    Pack years: 35.00    Last attempt to quit: 12/23/2007    Years since quitting: 10.3  . Smokeless tobacco: Never Used  . Tobacco comment: Uses patch  Substance Use Topics  . Alcohol use: Yes    Alcohol/week: 0.0 oz    Comment: 1 glass wine/mo  . Drug use: No    Current Outpatient Medications:  .  ALPRAZolam (XANAX) 0.5 MG tablet, Take 0.5 mg by mouth 2 (two) times daily as needed for sleep. 0.5-1 tablet, Disp: , Rfl:  .  AZOPT 1 % ophthalmic suspension, Place 1 drop into the left eye. , Disp: , Rfl: 0 .  bisacodyl (DULCOLAX) 5 MG EC tablet, Take 5 mg by mouth daily as needed for moderate constipation., Disp: , Rfl:  .  Brimonidine Tartrate-Timolol (COMBIGAN OP), Apply 1 drop to eye 2 (two) times daily., Disp: , Rfl:  .  buPROPion (WELLBUTRIN SR) 150 MG 12 hr tablet, Take 150 mg by mouth every morning. 2 tablets, Disp: , Rfl:  .  dextromethorphan 15 MG/5ML syrup, Take 10 mLs by mouth 4 (four) times daily as needed for cough., Disp: , Rfl:  .  escitalopram (LEXAPRO) 10 MG tablet, Take 10 mg by mouth daily. , Disp: , Rfl: 0 .  estradiol-levonorgestrel (CLIMARA PRO) 0.045-0.015 MG/DAY, apply 1 patch every week, Disp: 12 patch, Rfl: 0 .  fluticasone furoate-vilanterol (BREO ELLIPTA) 100-25 MCG/INH AEPB, Inhale 1 puff into the lungs daily., Disp: 3 each, Rfl: 3 .  gabapentin (NEURONTIN) 100 MG capsule, Take 1-3 capsules (100-300 mg total) by mouth at bedtime., Disp: 90 capsule, Rfl: 0 .  HYDROcodone-acetaminophen (NORCO/VICODIN) 5-325 MG tablet, Take 1 tablet by mouth every 6 (six) hours as needed for moderate pain., Disp: 60 tablet, Rfl: 0 .  ipratropium (ATROVENT) 0.06 % nasal spray, Place 2 sprays into both nostrils 4 (four) times daily as needed for rhinitis., Disp: , Rfl:  .  LUMIGAN 0.01 % SOLN, apply 1 drop to into both  eyes at bedtime once daily ONLY, Disp: , Rfl: 0 .  mirabegron ER  (MYRBETRIQ) 25 MG TB24 tablet, Take 1 tablet (25 mg total) by mouth daily., Disp: 90 tablet, Rfl: 3 .  nicotine polacrilex (NICORETTE) 2 MG gum, Take 2 mg by mouth as needed for smoking cessation., Disp: , Rfl:  .  ondansetron (ZOFRAN) 8 MG tablet, Take by mouth every 8 (eight) hours as needed for nausea or vomiting., Disp: , Rfl:  .  rosuvastatin (CRESTOR) 10 MG tablet, Take 10 mg by mouth at bedtime. , Disp: , Rfl:  .  VENTOLIN HFA 108 (90 Base) MCG/ACT inhaler, inhale 2 puffs by mouth INTO THE LUNGS every 6 hours if needed for wheezing or shortness of breath, Disp:  18 g, Rfl: 6 Allergies: Influenza vaccines; Flu virus vaccine; Gabapentin; Sulfa antibiotics; and Tetracyclines & related  Review of Systems  Constitutional: Negative for chills, fever and malaise/fatigue.  HENT: Negative for congestion, sinus pain and sore throat.   Eyes: Negative for blurred vision and pain.  Respiratory: Negative for cough and wheezing.   Cardiovascular: Negative for chest pain and leg swelling.  Gastrointestinal: Negative for abdominal pain, constipation, diarrhea, heartburn, nausea and vomiting.  Genitourinary: Negative for dysuria, frequency, hematuria and urgency.  Musculoskeletal: Negative for back pain, joint pain, myalgias and neck pain.  Skin: Negative for itching and rash.  Neurological: Negative for dizziness, tremors and weakness.  Endo/Heme/Allergies: Does not bruise/bleed easily.  Psychiatric/Behavioral: Negative for depression. The patient is not nervous/anxious and does not have insomnia.    Objective: BP 130/80   Ht 5\' 5"  (1.651 m)   Wt 104 lb (47.2 kg)   BMI 17.31 kg/m   Filed Weights   05/10/18 1331  Weight: 104 lb (47.2 kg)   Body mass index is 17.31 kg/m. Physical Exam  Constitutional: She is oriented to person, place, and time. She appears well-developed and well-nourished. No distress.  Genitourinary: Rectum normal, vagina normal and uterus normal. Pelvic exam was performed  with patient supine. There is no rash or lesion on the right labia. There is no rash or lesion on the left labia. Vagina exhibits no lesion. No bleeding in the vagina. Right adnexum does not display mass and does not display tenderness. Left adnexum does not display mass and does not display tenderness. Cervix does not exhibit motion tenderness, lesion, friability or polyp.   Uterus is mobile and midaxial. Uterus is not enlarged or exhibiting a mass.  HENT:  Head: Normocephalic and atraumatic. Head is without laceration.  Right Ear: Hearing normal.  Left Ear: Hearing normal.  Nose: No epistaxis.  No foreign bodies.  Mouth/Throat: Uvula is midline, oropharynx is clear and moist and mucous membranes are normal.  Eyes: Pupils are equal, round, and reactive to light.  Neck: Normal range of motion. Neck supple. No thyromegaly present.  Cardiovascular: Normal rate and regular rhythm. Exam reveals no gallop and no friction rub.  No murmur heard. Pulmonary/Chest: Effort normal and breath sounds normal. No respiratory distress. She has no wheezes. Right breast exhibits no mass, no skin change and no tenderness. Left breast exhibits no mass, no skin change and no tenderness.  Abdominal: Soft. Bowel sounds are normal. She exhibits no distension. There is no tenderness. There is no rebound.  Musculoskeletal: Normal range of motion.  Neurological: She is alert and oriented to person, place, and time. No cranial nerve deficit.  Skin: Skin is warm and dry.  Psychiatric: She has a normal mood and affect. Judgment normal.  Vitals reviewed.  Assessment: Annual Exam 1. Annual physical exam   2. Menopausal and female climacteric states   3. Screening for breast cancer   4. Screen for colon cancer   5. Screening for cervical cancer   6. Falls frequently    Plan:            1.  Cervical Screening-  Pap smear done today  2. Breast screening- Exam annually and mammogram scheduled  3. Colonoscopy every 10  years, Hemoccult testing after age 89  4. Labs managed by PCP  5. Counseling for hormonal therapy: no change in therapy today; cont HRT patch per her preference; pros and cons of hormonal therapy counseled  6. Osteoporosis and fracture risk due to  falls.  DEXA soon.    F/U  Return in about 1 year (around 05/11/2019) for Annual.  Barnett Applebaum, MD, [facility], [facility] 05/10/2018  2:00 PM

## 2018-05-11 ENCOUNTER — Ambulatory Visit: Payer: Medicare Other

## 2018-05-11 ENCOUNTER — Other Ambulatory Visit: Payer: Self-pay

## 2018-05-11 DIAGNOSIS — R278 Other lack of coordination: Secondary | ICD-10-CM

## 2018-05-11 DIAGNOSIS — R296 Repeated falls: Secondary | ICD-10-CM

## 2018-05-11 DIAGNOSIS — M6281 Muscle weakness (generalized): Secondary | ICD-10-CM

## 2018-05-11 DIAGNOSIS — R2681 Unsteadiness on feet: Secondary | ICD-10-CM | POA: Diagnosis not present

## 2018-05-11 DIAGNOSIS — R2689 Other abnormalities of gait and mobility: Secondary | ICD-10-CM

## 2018-05-11 NOTE — Therapy (Signed)
[place] MAIN [facility] SERVICES [address] [place], [place], [address] Phone: [phone]   Fax:  [phone]  Physical Therapy Treatment  Patient Details  Name: Elaine Clayton MRN: [id number] Date of Birth: [birth year] Referring Provider: Dr. Dossie Arbour   Encounter Date: 05/11/2018  PT End of Session - 05/11/18 1814    Visit Number  2    Number of Visits  13    Date for PT Re-Evaluation  07/29/18    PT Start Time  1225    PT Stop Time  1315    PT Time Calculation (min)  50 min    Activity Tolerance  Patient tolerated treatment well;No increased pain    Behavior During Therapy  WFL for tasks assessed/performed       Past Medical History:  Diagnosis Date  . Anemia   . Ataxia   . Bipolar affective disorder ([place])    2  . Broken foot 2015   right foot  . Chronic gastritis   . Chronic kidney disease    Dr Holley Raring  . COPD (chronic obstructive pulmonary disease) ([place])   . Dizziness    inner ear (per pt) several times per week  . Dysphagia   . Glaucoma   . Hyperlipemia   . Hypertension    pt has recently come off BP meds.  MD aware. BP seems stable.  . Lithium toxicity 2015  . 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 Neck stiffness    s/p C3-C4 fusion, limited right turn  . Osteoarthritis    back  . Pancreatitis   . Parkinson disease ([place])   . Personal history of malignant neoplasm of breast   . Pituitary microadenoma ([place])   . Recurrent UTI   . Renal insufficiency   . Stomach ulcer 2112    Past Surgical History:  Procedure Laterality Date  . APPENDECTOMY    . BREAST BIOPSY Right 2011   neg  . BREAST EXCISIONAL BIOPSY Right 2001   neg  . BREAST EXCISIONAL BIOPSY Right 2008   breast ca 2008 radiation  . BREAST EXCISIONAL BIOPSY Left 10/26/2012   neg  . BREAST SURGERY Left  2013   left breast wide excision,Intraductal papilloma, ductal hyperplasia  and sclerosing adenosis. Microcalcifications associated with columnar cell change. No evidence of atypia or malignancy. Margins are unremarkable.  Marland Kitchen BREAST SURGERY Right November 06, 1999   multiple areas of microcalcification showing evidence of sclerosing adenosis and ductal hyperplasia.  Marland Kitchen BREAST SURGERY Left May 07, 2007   wide excision.  . cataract Right 2013  . CATARACT EXTRACTION W/PHACO Left 03/05/2016   Procedure: CATARACT EXTRACTION PHACO AND INTRAOCULAR LENS PLACEMENT (IOC);  Surgeon: Leandrew Koyanagi, MD;  Location: [place];  Service: Ophthalmology;  Laterality: Left;  . COLONOSCOPY  2004  . COLONOSCOPY N/A 05/14/2015   Procedure: COLONOSCOPY;  Surgeon: Manya Silvas, MD;  Location: [facility] ENDOSCOPY;  Service: Endoscopy;  Laterality: N/A;  . ERCP N/A   . ESOPHAGOGASTRODUODENOSCOPY N/A 05/14/2015   Procedure: ESOPHAGOGASTRODUODENOSCOPY (EGD);  Surgeon: Manya Silvas, MD;  Location: [facility] ENDOSCOPY;  Service: Endoscopy;  Laterality: N/A;  . EUS N/A 2011, 2012  . EYE SURGERY  2013  . GLAUCOMA SURGERY Right 2013  . LAPAROSCOPIC RIGHT COLECTOMY Right 06/22/2015   Procedure: LAPAROSCOPIC RIGHT COLECTOMY;  Surgeon: Robert Bellow, MD;  Location: [facility] ORS;  Service: General;  Laterality: Right;  .  NECK SURGERY  2006  . right breast cancer    . SAVORY DILATION N/A 05/14/2015   Procedure: SAVORY DILATION;  Surgeon: Manya Silvas, MD;  Location: [facility] ENDOSCOPY;  Service: Endoscopy;  Laterality: N/A;  . TRABECULECTOMY Left 03/05/2016   Procedure: TRABECULECTOMY WITH [facility] AND [facility]T;  Surgeon: Leandrew Koyanagi, MD;  Location: [place];  Service: Ophthalmology;  Laterality: Left;  . TUBAL LIGATION      There were no vitals filed for this visit.  Subjective Assessment - 05/11/18 1806    Subjective  Pt reports having much greater difficulty with balance since pt was discharged from PT (aquatic therapist was unaware of discharge at the time). Pt/spouse  did speak with several of pt's doctors and was recommended for PT services with focus on aquatic therapy, as pt feels greatest benefit in this medium for exercise. According to recent evaluation, it was recommended that pt use wa.ker or rollator for improved safety with ambulation; pt noted to be using cane to the pool this date. Pt does have several small healing skin laceration and numerous bruises throughout forearms from "falling into walls".     Pertinent History  Pt is known to therapy clinic from prior episode. She underwent PT from 08/04/18-04/06/18 including aquatic and land based exercise for a total of 33 visits. She was not compliant during her last therapy episode with performing her home exercise program. At the end of her episode of care her outcome measures were worse than when she started. She recently saw Dr. Dossie Arbour for intraarticular hip injection and IT band injection. She feels like her balance has worsened since ending therapy. At end of last therapy episode pt was provided with an extensive list of community resources however she did not pursue any community based exercise program. She is moving to [employer] sometime in [place] in [place] where she will have access to a pool. Pt with extensive surgical and medical Hx including breast cancer (last surgery 2013), DJD with cervical fusion C3-5 in 2006, bipolar disorder, HTN, OA in bilateral shoulders L>R, hip and SIJ pain x >20 years. She states that she needs the accountability of therapy for her to continue her exercises and wants to start therapy again.       Ambulation, blue dumbbells  Fwd, 4 L  Side, 4 L  Core with LE strengthening, 30x ea  hip abd/add  Hip flex/ext  Bench, LE strength  Hip ext with noodle, B, 20x ea  STS, no UE, 20x  Balance  Side step with ball toss, 4L  SLS toe touch other with ball toss, B, 1 min each  Balance with 1 noodle for UE support  High knee traveling march, 2L   Bkwd walk, 2L (focus on hip ext)                           PT Education - 05/11/18 1813    Education provided  Yes    Education Details  Posture, core stabilization, strengthening and balance activities.     Person(s) Educated  Patient    Methods  Explanation;Demonstration;Tactile cues;Verbal cues    Comprehension  Verbalized understanding;Need further instruction       PT Short Term Goals - 05/06/18 1124      PT SHORT TERM GOAL #1   Title  Pt will be independent with land and aquatic HEP in order to improve strength and balance in order to  decrease fall risk and improve function at home and work.     Baseline  05/06/18: not performing consistently    Time  6    Period  Weeks    Status  New    Target Date  06/17/18      PT SHORT TERM GOAL #2   Title  Pt will appropriately use rollator in order to decrease fall risk and increase community access.     Baseline  Pt has a rollator but doesn't use consistently    Time  12    Period  Weeks    Status  New    Target Date  06/17/18        PT Long Term Goals - 05/06/18 1126      PT LONG TERM GOAL #1   Title  Patient will increase Functional Gait Assessment score to >20/30 as to reduce fall risk and improve dynamic gait safety with community ambulation.    Baseline  05/07/18: 9/30    Time  12    Period  Weeks    Status  New    Target Date  07/29/18      PT LONG TERM GOAL #2   Title  Patient will increase Berg Balance score by > 6 points to demonstrate decreased fall risk during functional activities.; Deferred as patient's balance is more impaired dynamically and therefore FGA goal more appropriate;     Baseline  05/06/18: 22/56    Time  12    Period  Weeks    Status  New    Target Date  07/29/18      PT LONG TERM GOAL #3   Title  Patient (> 78 years old) will complete five times sit to stand test in < 15 seconds indicating an increased LE strength and improved balance.    Baseline  05/06/18: unable to  perform without bilateral UE support    Time  12    Period  Weeks    Status  New    Target Date  07/29/18      PT LONG TERM GOAL #4   Title  Patient will ascend/descend 4 stairs without rail assist independently without loss of balance to improve ability to get in/out of home.     Baseline  05/07/18: able to negotiate reciprocally but requires rail assist    Time  12    Period  Weeks    Status  New    Target Date  07/29/18      PT LONG TERM GOAL #5   Title  --    Baseline  --    Time  --    Period  --    Status  --      PT LONG TERM GOAL #6   Title  --    Baseline  --    Time  --    Period  --    Status  --            Plan - 05/11/18 1815    Clinical Impression Statement  Pt arrives 15 min late and ready for session at 1225 after requiring some time in the locker room with assist. Pt demonstrates a need for Min guard/close supervision to Min A in water with activity/exercise due to loss of balance in various direction, but most often posteriorly or to the R. Pt requires constant verbal and tactile cueing for proper form/technique and attention to task at hand. Continue efforts to progress overall  core/LE strength and balance. On land post session, pt noted to lose balance fwd with ambulation with steppage gait requiring Min A. Spouse present to assist pt in locker room.     Rehab Potential  Poor    Clinical Impairments Affecting Rehab Potential  Pt does not use AD and had decreased knowledge of her limitations, poor insight, poor compliance with home program    PT Frequency  1x / week    PT Duration  12 weeks    PT Treatment/Interventions  ADLs/Self Care Home Management;Aquatic Therapy;Biofeedback;Canalith Repostioning;Cryotherapy;Moist Heat;Cognitive remediation;Neuromuscular re-education;Balance training;Therapeutic exercise;Therapeutic activities;Functional mobility training;Stair training;Gait training;DME Instruction;Patient/family education;Orthotic Fit/Training;Manual  techniques;Passive range of motion;Visual/perceptual remediation/compensation;Vestibular;Energy conservation;Dry needling    PT Next Visit Plan  Provide extensive written HEP, continue balance and strengthening     PT Home Exercise Plan  Extensive aquatic exercises provided (see pt information), will issue land based HEP at next session    Consulted and Agree with Plan of Care  Patient       Patient will benefit from skilled therapeutic intervention in order to improve the following deficits and impairments:  Abnormal gait, Decreased balance, Decreased safety awareness, Decreased knowledge of use of DME, Decreased knowledge of precautions, Decreased endurance, Decreased strength, Difficulty walking, Postural dysfunction, Pain, Decreased cognition, Decreased coordination  Visit Diagnosis: Unsteadiness on feet  Repeated falls  Muscle weakness (generalized)  Other lack of coordination  Other abnormalities of gait and mobility     Problem List Patient Active Problem List   Diagnosis Date Noted  . Osteoarthritis of hip (Right) 04/29/2018  . Iliotibial band syndrome (Right) 04/29/2018  . Allodynia 04/14/2018  . Spondylosis without myelopathy or radiculopathy, lumbosacral region 03/11/2018  . Other specified dorsopathies, sacral and sacrococcygeal region 03/11/2018  . Ataxic gait 11/20/2017  . Gait instability 11/20/2017  . 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 Cognitive decline 11/20/2017  . PBA (pseudobulbar affect) 11/20/2017  . Truncal ataxia 11/20/2017  . Lumbar facet syndrome (Bilateral) (L>R) 07/29/2017  . Chronic sacroiliac joint pain (Bilateral) (L>R) 07/29/2017  . Chronic hip pain (Bilateral) (L>R) 07/29/2017  . Chronic lower extremity pain (Secondary Area of Pain) (Bilateral) (L>R) 07/29/2017  . Impairment of balance 07/07/2017  . Menopausal and female climacteric states 06/15/2017  . Lumbar foraminal stenosis (T12-S1, multilevel) 06/10/2017  . Lumbar lateral  recess stenosis (left L2-3, bilateral L3-4, & right L4-5) 06/10/2017  . Grade 1 Anterolisthesis of L4 over L5 and L5 over S1 06/10/2017  . Lumbar facet hypertrophy (multilevel) 06/10/2017  . Lumbar Ligamentum flavum hypertrophy (HCC) (multilevel) 06/10/2017  . Dropfoot (Left) (L5 radiculopathy) 06/10/2017  . Neurogenic pain 06/09/2017  . DDD (degenerative disc disease), lumbar 06/09/2017  . 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 Closed fracture of metatarsal bone 05/06/2017  . 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 Abnormal MRI, cervical spine 05/06/2017  . 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[place]) 05/06/2017  . Cervical radiculitis 05/06/2017  . Chronic lumbar radiculopathy (Bilateral) (L>R) (left L5) 05/06/2017  . Chronic low back pain (Primary Area of Pain) (Bilateral) (L>R) 05/06/2017  . Abnormal MRI, lumbar spine 05/06/2017  . Chronic pain syndrome 03/19/2017  . Chronic shoulder pain (Fourth Area of Pain) (Bilateral) (L>R) 03/19/2017  . Osteoarthritis of shoulder (Bilateral) 03/19/2017  . Chronic neck pain [facility] Area of Pain) (Bilateral) (L>R) 03/19/2017  . History of C3-5 ACDF 03/19/2017  . Numbness and tingling in left upper extremity 03/19/2017  . Numbness and tingling  of right upper extremity 03/19/2017  . Upper extremity weakness (Bilateral) (L>R) 03/19/2017  . Numbness of upper extremity (Bilateral) (L>R) 03/19/2017  . Numbness and tingling of lower extremity (Bilateral) (L>R) 03/19/2017  . Weakness of lower extremity (Bilateral) (R>L) 03/19/2017  . Chronic abdominal pain (epigastric) 03/19/2017  . Pituitary microadenoma ([place]) 12/08/2016  . History of breast cancer in adulthood 12/08/2016  . Dysmetria 12/08/2016  . Loss of weight 08/15/2015  . Colon polyp 05/25/2015  . Personal history of malignant neoplasm of breast 07/18/2014  . Breast cancer ([place]) 06/28/2014   . Bipolar disorder ([place]) 06/28/2014  . Chronic pancreatitis ([place]) 06/28/2014  . HTN (hypertension) 06/28/2014  . OA (osteoarthritis) 06/28/2014  . Renal insufficiency 06/28/2014  . DDD (degenerative disc disease), cervical 06/28/2014  . Hyperlipidemia 06/28/2014  . COPD (chronic obstructive pulmonary disease) ([place]) 05/17/2014  . Breast cancer of upper-outer quadrant of right female breast ([place]) 05/31/2008    Larae Grooms 05/11/2018, 6:20 PM  [place] MAIN [facility] SERVICES [address] [place], [place], [address] Phone: [phone]   Fax:  [phone]  Name: Elaine Clayton MRN: [id number] Date of Birth: 21-Sep-1946

## 2018-05-13 LAB — IGP, APTIMA HPV
HPV Aptima: NEGATIVE
PAP Smear Comment: 0

## 2018-05-19 ENCOUNTER — Ambulatory Visit: Payer: Medicare Other

## 2018-05-19 VITALS — BP 119/73 | HR 63

## 2018-05-19 DIAGNOSIS — R2681 Unsteadiness on feet: Secondary | ICD-10-CM | POA: Diagnosis not present

## 2018-05-19 DIAGNOSIS — R296 Repeated falls: Secondary | ICD-10-CM

## 2018-05-19 DIAGNOSIS — M6281 Muscle weakness (generalized): Secondary | ICD-10-CM

## 2018-05-19 NOTE — Therapy (Signed)
[place] MAIN [facility] SERVICES [address] [place], [place], [address] Phone: [phone]   Fax:  [phone]  Physical Therapy Treatment  Patient Details  Name: Elaine Clayton MRN: [id number] Date of Birth: May 01, 1946 Referring Provider: Dr. Dossie Arbour   Encounter Date: 05/19/2018  PT End of Session - 05/19/18 1312    Visit Number  3    Number of Visits  13    Date for PT Re-Evaluation  07/29/18    Authorization Type  progress note 3/10    Authorization Time Period  last goals: 05/06/18    PT Start Time  1301    PT Stop Time  1345    PT Time Calculation (min)  44 min    Equipment Utilized During Treatment  Gait belt    Activity Tolerance  Patient tolerated treatment well;No increased pain    Behavior During Therapy  WFL for tasks assessed/performed       Past Medical History:  Diagnosis Date  . Anemia   . Ataxia   . Bipolar affective disorder ([place])    2  . Broken foot 2015   right foot  . Chronic gastritis   . Chronic kidney disease    Dr Holley Raring  . COPD (chronic obstructive pulmonary disease) ([place])   . Dizziness    inner ear (per pt) several times per week  . Dysphagia   . Glaucoma   . Hyperlipemia   . Hypertension    pt has recently come off BP meds.  MD aware. BP seems stable.  . Lithium toxicity 2015  . 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 Neck stiffness    s/p C3-C4 fusion, limited right turn  . Osteoarthritis    back  . Pancreatitis   . Parkinson disease ([place])   . Personal history of malignant neoplasm of breast   . Pituitary microadenoma ([place])   . Recurrent UTI   . Renal insufficiency   . Stomach ulcer 2112    Past Surgical History:  Procedure Laterality Date  . APPENDECTOMY    . BREAST BIOPSY Right 2011   neg  . BREAST EXCISIONAL BIOPSY Right 2001   neg  . BREAST EXCISIONAL BIOPSY Right 2008   breast ca 2008 radiation  .  BREAST EXCISIONAL BIOPSY Left 10/26/2012   neg  . BREAST SURGERY Left  2013   left breast wide excision,Intraductal papilloma, ductal hyperplasia and sclerosing adenosis. Microcalcifications associated with columnar cell change. No evidence of atypia or malignancy. Margins are unremarkable.  Marland Kitchen BREAST SURGERY Right November 06, 1999   multiple areas of microcalcification showing evidence of sclerosing adenosis and ductal hyperplasia.  Marland Kitchen BREAST SURGERY Left May 07, 2007   wide excision.  . cataract Right 2013  . CATARACT EXTRACTION W/PHACO Left 03/05/2016   Procedure: CATARACT EXTRACTION PHACO AND INTRAOCULAR LENS PLACEMENT (IOC);  Surgeon: Leandrew Koyanagi, MD;  Location: [place];  Service: Ophthalmology;  Laterality: Left;  . COLONOSCOPY  2004  . COLONOSCOPY N/A 05/14/2015   Procedure: COLONOSCOPY;  Surgeon: Manya Silvas, MD;  Location: [facility] ENDOSCOPY;  Service: Endoscopy;  Laterality: N/A;  . ERCP N/A   . ESOPHAGOGASTRODUODENOSCOPY N/A 05/14/2015   Procedure: ESOPHAGOGASTRODUODENOSCOPY (EGD);  Surgeon: Manya Silvas, MD;  Location: [facility] ENDOSCOPY;  Service: Endoscopy;  Laterality: N/A;  . EUS N/A 2011, 2012  . EYE SURGERY  2013  . GLAUCOMA SURGERY Right 2013  .  LAPAROSCOPIC RIGHT COLECTOMY Right 06/22/2015   Procedure: LAPAROSCOPIC RIGHT COLECTOMY;  Surgeon: Robert Bellow, MD;  Location: [facility] ORS;  Service: General;  Laterality: Right;  . NECK SURGERY  2006  . right breast cancer    . SAVORY DILATION N/A 05/14/2015   Procedure: SAVORY DILATION;  Surgeon: Manya Silvas, MD;  Location: [facility] ENDOSCOPY;  Service: Endoscopy;  Laterality: N/A;  . TRABECULECTOMY Left 03/05/2016   Procedure: TRABECULECTOMY WITH [facility] AND [employer] SHUNT;  Surgeon: Leandrew Koyanagi, MD;  Location: [place];  Service: Ophthalmology;  Laterality: Left;  . TUBAL LIGATION      Vitals:   05/19/18 1306  BP: 119/73  Pulse: 63  SpO2: 100%    Subjective Assessment - 05/19/18 1301     Subjective  Pt reports she has been having increased difficulty with her balance over the last few days. She arrives with a single point cane despite therapist advising her to utilize a walker at all times. She states that her walker is in the car. She is complaining of 6/10 low back pain. Pt reports that she fell down the last 5 steps in her house this morning. No specific questions at this time.     Pertinent History  Pt is known to therapy clinic from prior episode. She underwent PT from 08/04/18-04/06/18 including aquatic and land based exercise for a total of 33 visits. She was not compliant during her last therapy episode with performing her home exercise program. At the end of her episode of care her outcome measures were worse than when she started. She recently saw Dr. Dossie Arbour for intraarticular hip injection and IT band injection. She feels like her balance has worsened since ending therapy. At end of last therapy episode pt was provided with an extensive list of community resources however she did not pursue any community based exercise program. She is moving to [employer] sometime in [place] in [place] where she will have access to a pool. Pt with extensive surgical and medical Hx including breast cancer (last surgery 2013), DJD with cervical fusion C3-5 in 2006, bipolar disorder, HTN, OA in bilateral shoulders L>R, hip and SIJ pain x >20 years. She states that she needs the accountability of therapy for her to continue her exercises and wants to start therapy again.     Currently in Pain?  Yes    Pain Score  6     Pain Location  Back    Pain Orientation  Right;Left    Pain Descriptors / Indicators  Aching    Pain Type  Chronic pain    Pain Onset  More than a month ago           TREATMENT  Ther-ex  Octane xRide L4 x 5 minutes for warm-up during history; Quantum double leg press 90# 2 x 10; Stair training with faded UE support and constant CGA to minA+1,  4 steps, x 4 repetitions; Sit to stand without UE support in // bars 2 x 10, cues for anterior weight shifting due to repeated posterior LOB;  Neuromuscular Re-education  Toe taps to 6" step without UE support 2 x 15; Step-ups to 6" step with faded UE support alternating LE x 10 each; Airex balance with feet apart and eyes open/closed x 1 minute each; Airex balance with feet together and eyes open x 1 minute; Hurdle stepping in // bars without UE support x 10 on each side;  PT Education - 05/19/18 1312    Education provided  Yes    Education Details  exercise form/technique    Person(s) Educated  Patient    Methods  Explanation    Comprehension  Verbalized understanding       PT Short Term Goals - 05/06/18 1124      PT SHORT TERM GOAL #1   Title  Pt will be independent with land and aquatic HEP in order to improve strength and balance in order to decrease fall risk and improve function at home and work.     Baseline  05/06/18: not performing consistently    Time  6    Period  Weeks    Status  New    Target Date  06/17/18      PT SHORT TERM GOAL #2   Title  Pt will appropriately use rollator in order to decrease fall risk and increase community access.     Baseline  Pt has a rollator but doesn't use consistently    Time  12    Period  Weeks    Status  New    Target Date  06/17/18        PT Long Term Goals - 05/06/18 1126      PT LONG TERM GOAL #1   Title  Patient will increase Functional Gait Assessment score to >20/30 as to reduce fall risk and improve dynamic gait safety with community ambulation.    Baseline  05/07/18: 9/30    Time  12    Period  Weeks    Status  New    Target Date  07/29/18      PT LONG TERM GOAL #2   Title  Patient will increase Berg Balance score by > 6 points to demonstrate decreased fall risk during functional activities.; Deferred as patient's balance is more impaired dynamically and therefore FGA goal more  appropriate;     Baseline  05/06/18: 22/56    Time  12    Period  Weeks    Status  New    Target Date  07/29/18      PT LONG TERM GOAL #3   Title  Patient (> 35 years old) will complete five times sit to stand test in < 15 seconds indicating an increased LE strength and improved balance.    Baseline  05/06/18: unable to perform without bilateral UE support    Time  12    Period  Weeks    Status  New    Target Date  07/29/18      PT LONG TERM GOAL #4   Title  Patient will ascend/descend 4 stairs without rail assist independently without loss of balance to improve ability to get in/out of home.     Baseline  05/07/18: able to negotiate reciprocally but requires rail assist    Time  12    Period  Weeks    Status  New    Target Date  07/29/18      PT LONG TERM GOAL #5   Title  --    Baseline  --    Time  --    Period  --    Status  --      PT LONG TERM GOAL #6   Title  --    Baseline  --    Time  --    Period  --    Status  --  Plan - 05/19/18 1313    Clinical Impression Statement  Pt completes all exercises as instructed but demonstrates very poor insight and safety awareness. She is impulsive at times and arrives without a walker despite repeated instruction by primary therapist during evaluation to utilize a walker at all times. Pt fell down her bottom stairs this morning but states that she did not get injured. Will develop a land based HEP at next session for pt to complete safely at home. Pt will benefit from continued skilled PT services to address deficits in strength and balance in order to decrease her fall risk and improve her function at home.     Rehab Potential  Poor    Clinical Impairments Affecting Rehab Potential  Pt does not use AD and had decreased knowledge of her limitations, poor insight, poor compliance with home program    PT Frequency  1x / week    PT Duration  12 weeks    PT Treatment/Interventions  ADLs/Self Care Home Management;Aquatic  Therapy;Biofeedback;Canalith Repostioning;Cryotherapy;Moist Heat;Cognitive remediation;Neuromuscular re-education;Balance training;Therapeutic exercise;Therapeutic activities;Functional mobility training;Stair training;Gait training;DME Instruction;Patient/family education;Orthotic Fit/Training;Manual techniques;Passive range of motion;Visual/perceptual remediation/compensation;Vestibular;Energy conservation;Dry needling    PT Next Visit Plan  Provide extensive written HEP for land exercises, continue balance and strengthening     PT Home Exercise Plan  Extensive aquatic exercises provided (see pt information), will issue land based HEP at next session    Consulted and Agree with Plan of Care  Patient       Patient will benefit from skilled therapeutic intervention in order to improve the following deficits and impairments:  Abnormal gait, Decreased balance, Decreased safety awareness, Decreased knowledge of use of DME, Decreased knowledge of precautions, Decreased endurance, Decreased strength, Difficulty walking, Postural dysfunction, Pain, Decreased cognition, Decreased coordination  Visit Diagnosis: Unsteadiness on feet  Repeated falls  Muscle weakness (generalized)     Problem List Patient Active Problem List   Diagnosis Date Noted  . Osteoarthritis of hip (Right) 04/29/2018  . Iliotibial band syndrome (Right) 04/29/2018  . Allodynia 04/14/2018  . Spondylosis without myelopathy or radiculopathy, lumbosacral region 03/11/2018  . Other specified dorsopathies, sacral and sacrococcygeal region 03/11/2018  . Ataxic gait 11/20/2017  . Gait instability 11/20/2017  . 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 Cognitive decline 11/20/2017  . PBA (pseudobulbar affect) 11/20/2017  . Truncal ataxia 11/20/2017  . Lumbar facet syndrome (Bilateral) (L>R) 07/29/2017  . Chronic sacroiliac joint pain (Bilateral) (L>R) 07/29/2017  . Chronic hip pain (Bilateral) (L>R) 07/29/2017  . Chronic lower  extremity pain (Secondary Area of Pain) (Bilateral) (L>R) 07/29/2017  . Impairment of balance 07/07/2017  . Menopausal and female climacteric states 06/15/2017  . Lumbar foraminal stenosis (T12-S1, multilevel) 06/10/2017  . Lumbar lateral recess stenosis (left L2-3, bilateral L3-4, & right L4-5) 06/10/2017  . Grade 1 Anterolisthesis of L4 over L5 and L5 over S1 06/10/2017  . Lumbar facet hypertrophy (multilevel) 06/10/2017  . Lumbar Ligamentum flavum hypertrophy (HCC) (multilevel) 06/10/2017  . Dropfoot (Left) (L5 radiculopathy) 06/10/2017  . Neurogenic pain 06/09/2017  . DDD (degenerative disc disease), lumbar 06/09/2017  . 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 Closed fracture of metatarsal bone 05/06/2017  . 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 Abnormal MRI, cervical spine 05/06/2017  . 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[place]) 05/06/2017  . Cervical radiculitis 05/06/2017  . Chronic lumbar radiculopathy (Bilateral) (L>R) (left L5) 05/06/2017  . Chronic low back pain (Primary Area  of Pain) (Bilateral) (L>R) 05/06/2017  . Abnormal MRI, lumbar spine 05/06/2017  . Chronic pain syndrome 03/19/2017  . Chronic shoulder pain (Fourth Area of Pain) (Bilateral) (L>R) 03/19/2017  . Osteoarthritis of shoulder (Bilateral) 03/19/2017  . Chronic neck pain [facility] Area of Pain) (Bilateral) (L>R) 03/19/2017  . History of C3-5 ACDF 03/19/2017  . Numbness and tingling in left upper extremity 03/19/2017  . Numbness and tingling of right upper extremity 03/19/2017  . Upper extremity weakness (Bilateral) (L>R) 03/19/2017  . Numbness of upper extremity (Bilateral) (L>R) 03/19/2017  . Numbness and tingling of lower extremity (Bilateral) (L>R) 03/19/2017  . Weakness of lower extremity (Bilateral) (R>L) 03/19/2017  . Chronic abdominal pain (epigastric) 03/19/2017  . Pituitary microadenoma ([place])  12/08/2016  . History of breast cancer in adulthood 12/08/2016  . Dysmetria 12/08/2016  . Loss of weight 08/15/2015  . Colon polyp 05/25/2015  . Personal history of malignant neoplasm of breast 07/18/2014  . Breast cancer ([place]) 06/28/2014  . Bipolar disorder ([place]) 06/28/2014  . Chronic pancreatitis ([place]) 06/28/2014  . HTN (hypertension) 06/28/2014  . OA (osteoarthritis) 06/28/2014  . Renal insufficiency 06/28/2014  . DDD (degenerative disc disease), cervical 06/28/2014  . Hyperlipidemia 06/28/2014  . COPD (chronic obstructive pulmonary disease) ([place]) 05/17/2014  . Breast cancer of upper-outer quadrant of right female breast ([place]) 05/31/2008   Phillips Grout PT, DPT   Keisuke Hollabaugh 05/19/2018, 5:30 PM  [place] MAIN [facility] SERVICES [address] [place], [place], [address] Phone: [phone]   Fax:  [phone]  Name: Elaine Clayton MRN: [id number] Date of Birth: [birth year]

## 2018-05-20 ENCOUNTER — Emergency Department
Admission: EM | Admit: 2018-05-20 | Discharge: 2018-05-20 | Discharge status: Against Medical Advice | Payer: Medicare Other | Attending: Emergency Medicine | Admitting: Emergency Medicine

## 2018-05-20 ENCOUNTER — Encounter: Payer: Self-pay | Admitting: Emergency Medicine

## 2018-05-20 ENCOUNTER — Other Ambulatory Visit: Payer: Self-pay

## 2018-05-20 ENCOUNTER — Emergency Department: Payer: Medicare Other

## 2018-05-20 DIAGNOSIS — I639 Cerebral infarction, unspecified: Secondary | ICD-10-CM | POA: Insufficient documentation

## 2018-05-20 DIAGNOSIS — G20 Parkinson's disease: Secondary | ICD-10-CM | POA: Diagnosis not present

## 2018-05-20 DIAGNOSIS — I129 Hypertensive chronic kidney disease with stage 1 through stage 4 chronic kidney disease, or unspecified chronic kidney disease: Secondary | ICD-10-CM | POA: Insufficient documentation

## 2018-05-20 DIAGNOSIS — Z79899 Other long term (current) drug therapy: Secondary | ICD-10-CM | POA: Diagnosis not present

## 2018-05-20 DIAGNOSIS — J449 Chronic obstructive pulmonary disease, unspecified: Secondary | ICD-10-CM | POA: Insufficient documentation

## 2018-05-20 DIAGNOSIS — N189 Chronic kidney disease, unspecified: Secondary | ICD-10-CM | POA: Insufficient documentation

## 2018-05-20 DIAGNOSIS — Z87891 Personal history of nicotine dependence: Secondary | ICD-10-CM | POA: Insufficient documentation

## 2018-05-20 DIAGNOSIS — R4182 Altered mental status, unspecified: Secondary | ICD-10-CM | POA: Diagnosis present

## 2018-05-20 LAB — URINALYSIS, COMPLETE (UACMP) WITH MICROSCOPIC
BILIRUBIN URINE: NEGATIVE
GLUCOSE, UA: NEGATIVE mg/dL
HGB URINE DIPSTICK: NEGATIVE
Ketones, ur: NEGATIVE mg/dL
Leukocytes, UA: NEGATIVE
Nitrite: NEGATIVE
PH: 6 (ref 5.0–8.0)
Protein, ur: NEGATIVE mg/dL
SPECIFIC GRAVITY, URINE: 1.002 — AB (ref 1.005–1.030)
Squamous Epithelial / LPF: NONE SEEN (ref 0–5)
WBC, UA: NONE SEEN WBC/hpf (ref 0–5)

## 2018-05-20 LAB — COMPREHENSIVE METABOLIC PANEL
ALT: 18 U/L (ref 14–54)
AST: 27 U/L (ref 15–41)
Albumin: 4.4 g/dL (ref 3.5–5.0)
Alkaline Phosphatase: 48 U/L (ref 38–126)
Anion gap: 9 (ref 5–15)
BILIRUBIN TOTAL: 0.8 mg/dL (ref 0.3–1.2)
BUN: 14 mg/dL (ref 6–20)
CALCIUM: 9.3 mg/dL (ref 8.9–10.3)
CHLORIDE: 104 mmol/L (ref 101–111)
CO2: 26 mmol/L (ref 22–32)
CREATININE: 0.92 mg/dL (ref 0.44–1.00)
Glucose, Bld: 89 mg/dL (ref 65–99)
Potassium: 3.8 mmol/L (ref 3.5–5.1)
Sodium: 139 mmol/L (ref 135–145)
TOTAL PROTEIN: 7.5 g/dL (ref 6.5–8.1)

## 2018-05-20 LAB — DIFFERENTIAL
BASOS ABS: 0.1 10*3/uL (ref 0–0.1)
BASOS PCT: 1 %
EOS ABS: 0.2 10*3/uL (ref 0–0.7)
Eosinophils Relative: 2 %
LYMPHS ABS: 1.7 10*3/uL (ref 1.0–3.6)
Lymphocytes Relative: 21 %
MONO ABS: 0.7 10*3/uL (ref 0.2–0.9)
MONOS PCT: 8 %
NEUTROS ABS: 5.5 10*3/uL (ref 1.4–6.5)
Neutrophils Relative %: 68 %

## 2018-05-20 LAB — TROPONIN I

## 2018-05-20 LAB — PROTIME-INR
INR: 0.88
Prothrombin Time: 11.9 seconds (ref 11.4–15.2)

## 2018-05-20 LAB — CBC
HEMATOCRIT: 41.3 % (ref 35.0–47.0)
HEMOGLOBIN: 13.8 g/dL (ref 12.0–16.0)
MCH: 30.1 pg (ref 26.0–34.0)
MCHC: 33.3 g/dL (ref 32.0–36.0)
MCV: 90.2 fL (ref 80.0–100.0)
Platelets: 285 10*3/uL (ref 150–440)
RBC: 4.58 MIL/uL (ref 3.80–5.20)
RDW: 15 % — AB (ref 11.5–14.5)
WBC: 8.1 10*3/uL (ref 3.6–11.0)

## 2018-05-20 LAB — APTT: APTT: 29 s (ref 24–36)

## 2018-05-20 MED ORDER — ASPIRIN 81 MG PO CHEW
324.0000 mg | CHEWABLE_TABLET | Freq: Once | ORAL | Status: DC
  Filled 2018-05-20: qty 4

## 2018-05-20 MED ORDER — ASPIRIN 81 MG PO CHEW
243.0000 mg | CHEWABLE_TABLET | Freq: Once | ORAL | Status: AC
Start: 2018-05-20 — End: 2018-05-20
  Administered 2018-05-20: 243 mg via ORAL

## 2018-05-20 NOTE — Consult Note (Signed)
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 Given her history, suspect this is worsening of her underlying disease which does typically progress quickly especially in regard to gait dysfunction.   She is scheduled to follow up with her [facility] neurologist is a few weeks.  Not a tpa candidate due to: outside treatment winodw, improved,  Does not meet LVO screening criteria (no aphasia, neglect, gaze deviation, dense hemiparesis, or visual field deficits on exam), therefore advanced imaging is not indicated.    Comments:   We are asked to see as a STAT consult  Recommendations:   MRI brain r/o stroke  AS Parkinsonian patients are at high risk for decompensation in house, would aim to discharge ASAP in the setting of an unremarkable MRI. If OBS status possible, would favor that.  No changes to medication recommended at this time.  Outpt neuro follow up already scheduled for 2 weeks time.  Consider neurology consult if abnormal MRI Discussed with ED MD Please call with questions  -----------------------------------------------------------------------------------------  CC stat consult  History of Present Illness   Patient is a  72 yo F with h/o atypical parkinsonism She reports to the ED today with acute on chronic worsening of some common symptoms she has been experiencing over the last 8 months.  She noted today worsening clumsiness of the left hand specifically, she had trouble holding a cup. She has seen this intermittently over the last several monthjs but was worse today. She reports these as TIAs and has not related them to her Parkinsonsim in the past.  She also not more gait hesitancy and more gate freezing.  She is now needing to use a walker all the time.  She  denies resting tremor.   Diagnostic: HCT is negative for acute findings.  She has not had an MRI is some years.   Exam: Patient is in no apparent distress. Patient appears as stated age. No obvious acute respiratory or cardiac distress. Patient is well groomed and well-nourished.  Left hand is mildly sdystonic with smild posturing of the fingers. There si brakykinsia or this hand more than the right although both are involved.  The Finger taps, hand openings and RAM are all impaired left more than sright.  She has bil postural tremor of the legs.  No facial asymmetry No abnormal facial posture No resting tremor Speech and language are norma. l       Medical Decision Making:  - Extensive number of diagnosis or management options are considered above.   - Extensive amount of complex data reviewed.   - High risk of complication and/or morbidity or mortality are associated with differential diagnostic considerations above.  - There may be Uncertain outcome and increased probability of prolonged functional impairment or high probability of severe prolonged functional impairment associated with some of these differential diagnosis.  Medical Data Reviewed:  1.Data reviewed include clinical labs, radiology,  Medical Tests;   2.Tests results discussed w/performing or interpreting physician;   3.Obtaining/reviewing old medical records;  4.Obtaining case history from another source;  5.Independent review of image, tracing or specimen.    Patient was informed the Neurology Consult would happen via telehealth (remote video) and consented to receiving care in this manner.

## 2018-05-20 NOTE — ED Provider Notes (Signed)
[facility] Emergency Department Provider Note   ____________________________________________   First MD Initiated Contact with Patient 05/20/18 1629     (approximate)  I have reviewed the triage vital signs and the nursing notes.   HISTORY  Chief Complaint Altered Mental Status   HPI Elaine Clayton is a 72 y.o. female Who comes in with her husband. Husband reports she was normal at 10:00 but when he got back at 11:00 she could not hold a cup with her right hand. She had a lot of trouble walking much more than usual. Couldn't get her feet to come off the floor. When they arrived here patient was much better and could easily hold cup with both hands. She however had a lot of difficulty walking still and had trouble getting her feet to "get unstuck" from the floor. Patient denies any pain patient's speech is normal at present. She did have 1 episode of shaking in the right hand and arm while we were there but it was very brief.patient has a history of truncal ataxia and Parkinson's per the husband. Patient reports her left side is usually work weaker than the right.  Past Medical History:  Diagnosis Date  . Anemia   . Ataxia   . Bipolar affective disorder ([place])    2  . Broken foot 2015   right foot  . Chronic gastritis   . Chronic kidney disease    Dr Holley Raring  . COPD (chronic obstructive pulmonary disease) ([place])   . Dizziness    inner ear (per pt) several times per week  . Dysphagia   . Glaucoma   . Hyperlipemia   . Hypertension    pt has recently come off BP meds.  MD aware. BP seems stable.  . Lithium toxicity 2015  . 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 Neck stiffness    s/p C3-C4 fusion, limited right turn  . Osteoarthritis    back  . Pancreatitis   . Parkinson disease ([place])   . Personal history of malignant neoplasm of breast   . Pituitary microadenoma ([place])     . Recurrent UTI   . Renal insufficiency   . Stomach ulcer 2112    Patient Active Problem List   Diagnosis Date Noted  . Osteoarthritis of hip (Right) 04/29/2018  . Iliotibial band syndrome (Right) 04/29/2018  . Allodynia 04/14/2018  . Spondylosis without myelopathy or radiculopathy, lumbosacral region 03/11/2018  . Other specified dorsopathies, sacral and sacrococcygeal region 03/11/2018  . Ataxic gait 11/20/2017  . Gait instability 11/20/2017  . 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 Cognitive decline 11/20/2017  . PBA (pseudobulbar affect) 11/20/2017  . Truncal ataxia 11/20/2017  . Lumbar facet syndrome (Bilateral) (L>R) 07/29/2017  . Chronic sacroiliac joint pain (Bilateral) (L>R) 07/29/2017  . Chronic hip pain (Bilateral) (L>R) 07/29/2017  . Chronic lower extremity pain (Secondary Area of Pain) (Bilateral) (L>R) 07/29/2017  . Impairment of balance 07/07/2017  . Menopausal and female climacteric states 06/15/2017  . Lumbar foraminal stenosis (T12-S1, multilevel) 06/10/2017  . Lumbar lateral recess stenosis (left L2-3, bilateral L3-4, & right L4-5) 06/10/2017  . Grade 1 Anterolisthesis of L4 over L5 and L5 over S1 06/10/2017  . Lumbar facet hypertrophy (multilevel) 06/10/2017  . Lumbar Ligamentum flavum hypertrophy (HCC) (multilevel) 06/10/2017  . Dropfoot (Left) (L5 radiculopathy) 06/10/2017  . Neurogenic pain 06/09/2017  . DDD (degenerative disc disease), lumbar 06/09/2017  .  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 Closed fracture of metatarsal bone 05/06/2017  . 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 Abnormal MRI, cervical spine 05/06/2017  . 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[place]) 05/06/2017  . Cervical radiculitis 05/06/2017  . Chronic lumbar radiculopathy (Bilateral) (L>R) (left L5) 05/06/2017  . Chronic low back pain (Primary Area of Pain) (Bilateral)  (L>R) 05/06/2017  . Abnormal MRI, lumbar spine 05/06/2017  . Chronic pain syndrome 03/19/2017  . Chronic shoulder pain (Fourth Area of Pain) (Bilateral) (L>R) 03/19/2017  . Osteoarthritis of shoulder (Bilateral) 03/19/2017  . Chronic neck pain [facility] Area of Pain) (Bilateral) (L>R) 03/19/2017  . History of C3-5 ACDF 03/19/2017  . Numbness and tingling in left upper extremity 03/19/2017  . Numbness and tingling of right upper extremity 03/19/2017  . Upper extremity weakness (Bilateral) (L>R) 03/19/2017  . Numbness of upper extremity (Bilateral) (L>R) 03/19/2017  . Numbness and tingling of lower extremity (Bilateral) (L>R) 03/19/2017  . Weakness of lower extremity (Bilateral) (R>L) 03/19/2017  . Chronic abdominal pain (epigastric) 03/19/2017  . Pituitary microadenoma ([place]) 12/08/2016  . History of breast cancer in adulthood 12/08/2016  . Dysmetria 12/08/2016  . Loss of weight 08/15/2015  . Colon polyp 05/25/2015  . Personal history of malignant neoplasm of breast 07/18/2014  . Breast cancer ([place]) 06/28/2014  . Bipolar disorder ([place]) 06/28/2014  . Chronic pancreatitis ([place]) 06/28/2014  . HTN (hypertension) 06/28/2014  . OA (osteoarthritis) 06/28/2014  . Renal insufficiency 06/28/2014  . DDD (degenerative disc disease), cervical 06/28/2014  . Hyperlipidemia 06/28/2014  . COPD (chronic obstructive pulmonary disease) ([place]) 05/17/2014  . Breast cancer of upper-outer quadrant of right female breast ([place]) 05/31/2008    Past Surgical History:  Procedure Laterality Date  . APPENDECTOMY    . BREAST BIOPSY Right 2011   neg  . BREAST EXCISIONAL BIOPSY Right 2001   neg  . BREAST EXCISIONAL BIOPSY Right 2008   breast ca 2008 radiation  . BREAST EXCISIONAL BIOPSY Left 10/26/2012   neg  . BREAST SURGERY Left  2013   left breast wide excision,Intraductal papilloma, ductal hyperplasia and sclerosing adenosis. Microcalcifications associated with columnar cell change. No evidence of atypia or  malignancy. Margins are unremarkable.  Marland Kitchen BREAST SURGERY Right November 06, 1999   multiple areas of microcalcification showing evidence of sclerosing adenosis and ductal hyperplasia.  Marland Kitchen BREAST SURGERY Left May 07, 2007   wide excision.  . cataract Right 2013  . CATARACT EXTRACTION W/PHACO Left 03/05/2016   Procedure: CATARACT EXTRACTION PHACO AND INTRAOCULAR LENS PLACEMENT (IOC);  Surgeon: Leandrew Koyanagi, MD;  Location: [place];  Service: Ophthalmology;  Laterality: Left;  . COLONOSCOPY  2004  . COLONOSCOPY N/A 05/14/2015   Procedure: COLONOSCOPY;  Surgeon: Manya Silvas, MD;  Location[facility] ENDOSCOPY;  Service: Endoscopy;  Laterality: N/A;  . ERCP N/A   . ESOPHAGOGASTRODUODENOSCOPY N/A 05/14/2015   Procedure: ESOPHAGOGASTRODUODENOSCOPY (EGD);  Surgeon: Manya Silvas, MD;  Location: [facility] ENDOSCOPY;  Service: Endoscopy;  Laterality: N/A;  . EUS N/A 2011, 2012  . EYE SURGERY  2013  . GLAUCOMA SURGERY Right 2013  . LAPAROSCOPIC RIGHT COLECTOMY Right 06/22/2015   Procedure: LAPAROSCOPIC RIGHT COLECTOMY;  Surgeon: Robert Bellow, MD;  Location: [facility] ORS;  Service: General;  Laterality: Right;  . NECK SURGERY  2006  . right breast cancer    . SAVORY DILATION N/A 05/14/2015   Procedure: SAVORY DILATION;  Surgeon: Gavin Pound  Vira Agar, MD;  Location: [place] ENDOSCOPY;  Service: Endoscopy;  Laterality: N/A;  . TRABECULECTOMY Left 03/05/2016   Procedure: TRABECULECTOMY WITH [facility] AND EXPRESS SHUNT;  Surgeon: Leandrew Koyanagi, MD;  Location: [place];  Service: Ophthalmology;  Laterality: Left;  . TUBAL LIGATION      Prior to Admission medications   Medication Sig Start Date End Date Taking? Authorizing Provider  ALPRAZolam Duanne Moron) 0.5 MG tablet Take 0.5 mg by mouth 2 (two) times daily as needed for sleep. 0.5-1 tablet    [provider]  AZOPT 1 % ophthalmic suspension Place 1 drop into the left eye.  06/26/17   [provider]  bisacodyl (DULCOLAX) 5 MG  EC tablet Take 5 mg by mouth daily as needed for moderate constipation.    [provider]  Brimonidine Tartrate-Timolol (COMBIGAN OP) Apply 1 drop to eye 2 (two) times daily.    [provider]  buPROPion (WELLBUTRIN SR) 150 MG 12 hr tablet Take 150 mg by mouth every morning. 2 tablets    [provider]  dextromethorphan 15 MG/5ML syrup Take 10 mLs by mouth 4 (four) times daily as needed for cough.    [provider]  escitalopram (LEXAPRO) 10 MG tablet Take 10 mg by mouth daily.  05/14/17   [provider]  estradiol-levonorgestrel [facility] PRO) 0.045-0.015 MG/DAY apply 1 patch every week 05/10/18   Gae Dry, MD  fluticasone furoate-vilanterol (BREO ELLIPTA) 100-25 MCG/INH AEPB Inhale 1 puff into the lungs daily. 11/24/16   Wilhelmina Mcardle, MD  gabapentin (NEURONTIN) 100 MG capsule Take 1-3 capsules (100-300 mg total) by mouth at bedtime. 02/02/18 03/11/18  Milinda Pointer, MD  HYDROcodone-acetaminophen (NORCO/VICODIN) 5-325 MG tablet Take 1 tablet by mouth every 6 (six) hours as needed for moderate pain. 03/11/18 04/29/18  Milinda Pointer, MD  ipratropium (ATROVENT) 0.06 % nasal spray Place 2 sprays into both nostrils 4 (four) times daily as needed for rhinitis.    [provider]  LUMIGAN 0.01 % SOLN apply 1 drop to into both  eyes at bedtime once daily ONLY 03/06/17   [provider]  mirabegron ER (MYRBETRIQ) 25 MG TB24 tablet Take 1 tablet (25 mg total) by mouth daily. 03/24/18   Zara Council A, PA-C  nicotine polacrilex (NICORETTE) 2 MG gum Take 2 mg by mouth as needed for smoking cessation.    [provider]  ondansetron (ZOFRAN) 8 MG tablet Take by mouth every 8 (eight) hours as needed for nausea or vomiting.    [provider]  rosuvastatin (CRESTOR) 10 MG tablet Take 10 mg by mouth at bedtime.     [provider]  VENTOLIN HFA 108 (90 Base) MCG/ACT inhaler inhale 2 puffs by mouth INTO THE  LUNGS every 6 hours if needed for wheezing or shortness of breath 07/03/17   Wilhelmina Mcardle, MD    Allergies Influenza vaccines; Flu virus vaccine; Gabapentin; Sulfa antibiotics; and Tetracyclines & related  Family History  Problem Relation Age of Onset  . Breast cancer Mother 58  . Kidney cancer Neg Hx   . Bladder Cancer Neg Hx     Social History Social History   Tobacco Use  . Smoking status: Former Smoker    Packs/day: 1.00    Years: 35.00    Pack years: 35.00    Last attempt to quit: 12/23/2007    Years since quitting: 10.4  . Smokeless tobacco: Never Used  . Tobacco comment: Uses patch  Substance Use Topics  .  Alcohol use: Yes    Alcohol/week: 0.0 oz    Comment: 1 glass wine/mo  . Drug use: No    Review of Systems  Constitutional: No fever/chills Eyes: No visual changes. ENT: No sore throat. Cardiovascular: Denies chest pain. Respiratory: Denies shortness of breath. Gastrointestinal: No abdominal pain.  No nausea, no vomiting.  No diarrhea.  No constipation. Genitourinary: Negative for dysuria. Musculoskeletal: Negative for back pain. Skin: Negative for rash. Neurological: see history of present illness ____________________________________________   PHYSICAL EXAM:  VITAL SIGNS: ED Triage Vitals  Enc Vitals Group     BP 05/20/18 1456 (!) 157/76     Pulse Rate 05/20/18 1456 89     Resp 05/20/18 1456 14     Temp 05/20/18 1456 98.3 F (36.8 C)     Temp Source 05/20/18 1456 Oral     SpO2 05/20/18 1456 100 %     Weight 05/20/18 1457 104 lb (47.2 kg)     Height 05/20/18 1457 5\' 5"  (1.651 m)     Head Circumference --      Peak Flow --      Pain Score 05/20/18 1454 7     Pain Loc --      Pain Edu? --      Excl. in [place]? --     Constitutional: Alert and oriented. Well appearing and in no acute distress. Eyes: Conjunctivae are normal. PERRL. EOMI. Head: Atraumatic. Nose: No congestion/rhinnorhea. Mouth/Throat: Mucous membranes are moist.  Oropharynx  non-erythematous. Neck: No stridor. Cardiovascular: Normal rate, regular rhythm. Grossly normal heart sounds.  Good peripheral circulation. Respiratory: Normal respiratory effort.  No retractions. Lungs CTAB. Gastrointestinal: Soft and nontender. No distention. No abdominal bruits. No CVA tenderness. Musculoskeletal: No lower extremity tenderness nor edema.  No joint effusions. Neurologic:  Normal speech and language. No gross focal neurologic deficits are appreciated. cranial nerves II through XII are intact was just a little bit of trouble getting patient to follow directions to get her eyes to move. Motor strength is slightly weaker in the left arm now she has some ataxia especially in rapid alternating movements with the left arm right arm is back to normal. Patient is unable to walk well even with 2 people holding her. Left foot has a lot of trouble moving her right foot is also not moving as well as it should. On examination she does have good strength in both feet. Skin:  Skin is warm, dry and intact. No rash noted. Psychiatric: Mood and affect are normal. Speech and behavior are normal.  ____________________________________________   LABS (all labs ordered are listed, but only abnormal results are displayed)  Labs Reviewed  CBC - Abnormal; Notable for the following components:      Result Value   RDW 15.0 (*)    All other components within normal limits  URINALYSIS, COMPLETE (UACMP) WITH MICROSCOPIC - Abnormal; Notable for the following components:   Color, Urine STRAW (*)    APPearance CLEAR (*)    Specific Gravity, Urine 1.002 (*)    Bacteria, UA RARE (*)    All other components within normal limits  PROTIME-INR  APTT  DIFFERENTIAL  COMPREHENSIVE METABOLIC PANEL  TROPONIN I  CBG MONITORING, ED   ____________________________________________  EKG  EKG read and interpreted by me shows normal sinus rhythm rate of 89 normal axis no acute ST-T wave changes possible right  atrial enlargement ____________________________________________  RADIOLOGY  ED MD interpretation:  CT read by radiology essentially negative  Official radiology  report(s): Ct Head Wo Contrast  Result Date: 05/20/2018 CLINICAL DATA:  Numbness around the mouth. EXAM: CT HEAD WITHOUT CONTRAST TECHNIQUE: Contiguous axial images were obtained from the base of the skull through the vertex without intravenous contrast. COMPARISON:  03/19/2018 FINDINGS: Brain: No evidence of acute infarction, hemorrhage, extra-axial collection, ventriculomegaly, or mass effect. Generalized cerebral atrophy. Periventricular white matter low attenuation likely secondary to microangiopathy. Vascular: Cerebrovascular atherosclerotic calcifications are noted. Skull: Negative for fracture or focal lesion. Sinuses/Orbits: Visualized portions of the orbits are unremarkable. Visualized portions of the paranasal sinuses and mastoid air cells are unremarkable. Other: None. IMPRESSION: No acute intracranial pathology. Electronically Signed   By: Kathreen Devoid   On: 05/20/2018 15:38    ____________________________________________   PROCEDURES  Procedure(s) performed:  Procedures  Critical Care performed:   ____________________________________________   INITIAL IMPRESSION / ASSESSMENT AND PLAN / ED COURSE  patient is aware of the risks of leaving the hospital before for evaluation is complete for appears to be a TIA. Patient's symptoms is far as the right hand weakness resolved. Patient still experiencing more than usual trouble walking. Tell neurology wanted at least an MRI if that was okay she can go home which she will not even wait for the MRI. Her husband agrees to bring her back if she has any further problems and again she is aware of the fact that if she is having a TIA she is increased risk for stroke and paralysis. She refuses to stay she says she just can't wait any longer. Dr. Doy Hutching is her family doctor she will  follow-up with him and he can complete the workup outpatient.         ____________________________________________   FINAL CLINICAL IMPRESSION(S) / ED DIAGNOSES  Final diagnoses:  Cerebrovascular accident (CVA), unspecified mechanism [facility])     ED Discharge Orders    None       Note:  This document was prepared using Dragon voice recognition software and may include unintentional dictation errors.    Nena Polio, MD 05/20/18 2026

## 2018-05-20 NOTE — Discharge Instructions (Addendum)
I really wish you would wait and stay in the hospital first complete cure evaluation for what appears to be a stroke. He leaves now you could have another one could get worse could become permanently crippled or even die. Since she won't stay please take one aspirin a day. Please call up her regular doctor tomorrow and have him begin the further evaluation for your stroke this includes a carotid Doppler and cardiac echo and MRI of the head. He probably should also get a Holter monitor. Please return at once if you have any further worsening.

## 2018-05-20 NOTE — ED Notes (Signed)
Patient and husband reports improvement in symptoms since arrival to hospital. Patient able to follow commands.

## 2018-05-20 NOTE — ED Triage Notes (Signed)
Says she has new jumping in both feet.  Husband says she could not pick up a cup with right hand today at noon.  He last saw her normal at 10 am.   When he got back at 12 noon he got her upand she had much trouble walking with her walker.  Patient is not clear on what is new and what is old.  Says she does have some numbness around mouth.

## 2018-05-20 NOTE — ED Notes (Addendum)
Patient insistent on leaving from ED and not being discharged. MD aware and at bedside to speak with patient. MD explained to patient and husband risks of leaving AMA. Patient and family verbalized understanding. Patient states, "I just want to go home. I'll come back if I start feeling worse." This RN repeated risks of leaving prior to full evaluation. Patient and husband verbalized understanding and still wish to leave.

## 2018-05-23 NOTE — Progress Notes (Signed)
Patient's Name: Elaine Clayton  MRN: [id number]  Referring Provider: Idelle Crouch, MD  DOB: 22-Apr-1946  PCP: Idelle Crouch, MD  DOS: 05/24/2018  Note by: Gaspar Cola, MD  Service setting: Ambulatory outpatient  Specialty: Interventional Pain Management  Location: [facility] (AMB) Pain Management Facility    Patient type: Established   Primary Reason(s) for Visit: Encounter for post-procedure evaluation of chronic illness with mild to moderate exacerbation CC: Back Pain (lower right)  HPI  Elaine Clayton is a 72 y.o. year old, female patient, who comes today for a post-procedure evaluation. She has Breast cancer of upper-outer quadrant of right female breast ([place]); Personal history of malignant neoplasm of breast; Colon polyp; Loss of weight; Pituitary microadenoma ([place]); History of breast cancer in adulthood; Dysmetria; Breast cancer ([place]); Bipolar disorder ([place]); Chronic pancreatitis ([place]); COPD (chronic obstructive pulmonary disease) (HCC); HTN (hypertension); OA (osteoarthritis); Renal insufficiency; Chronic pain syndrome; Chronic shoulder pain (Fourth Area of Pain) (Bilateral) (L>R); Osteoarthritis of shoulder (Bilateral); Chronic neck pain (Tertiary Area of Pain) (Bilateral) (L>R); History of C3-5 ACDF; Numbness and tingling in left upper extremity; Numbness and tingling of right upper extremity; Upper extremity weakness (Bilateral) (L>R); Numbness of upper extremity (Bilateral) (L>R); Numbness and tingling of lower extremity (Bilateral) (L>R); Weakness of lower extremity (Bilateral) (R>L); Chronic abdominal pain (epigastric); Closed fracture of metatarsal bone; 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); 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); Abnormal MRI, cervical spine; 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[place]); Cervical radiculitis; Chronic lumbar radiculopathy (Bilateral) (L>R) (left L5); Chronic low back pain (Primary Area of Pain) (Bilateral) (L>R); Abnormal MRI, lumbar spine;  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); DDD (degenerative disc disease), cervical; Neurogenic pain; DDD (degenerative disc disease), lumbar; Lumbar foraminal stenosis (T12-S1, multilevel); Lumbar lateral recess stenosis (left L2-3, bilateral L3-4, & right L4-5); Grade 1 Anterolisthesis of L4 over L5 and L5 over S1; Lumbar facet hypertrophy (multilevel); Lumbar Ligamentum flavum hypertrophy (HCC) (multilevel); Dropfoot (Left) (L5 radiculopathy); Menopausal and female climacteric states; Impairment of balance; Lumbar facet syndrome (Bilateral) (L>R); Chronic sacroiliac joint pain (Bilateral) (L>R); Chronic hip pain (Bilateral) (L>R); Chronic lower extremity pain (Secondary Area of Pain) (Bilateral) (L>R); Hyperlipidemia; Ataxic gait (Associated w/ CSF Pressure); Gait instability; 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 Her primarily concern today is the Back Pain (lower right)  Pain Assessment: Location: Lower, Right Back Radiating: down the right leg to just below the knee Onset: More than a month ago Duration: Chronic pain Quality: Discomfort, Constant(patient has a tender spot midline lower back which she states could be a fall from the bathroom. ) Severity: 4 /10 (subjective, self-reported pain score)  Note: Reported level is compatible with observation.                         When using our objective Pain Scale, levels between 6 and 10/10 are said to belong in an emergency room, as it progressively worsens from a 6/10, described as severely limiting, requiring emergency care not usually available at an outpatient pain management facility. At a 6/10 level, communication becomes difficult and requires great  effort. Assistance to reach the emergency department may be required. Facial flushing and profuse sweating along with potentially dangerous increases in heart rate and blood pressure will be evident. Effect on ADL: when she  becomes tired approx mid day is when the pain becomes worse and she has to rest.  Timing: Constant(varies in intensity) Modifying factors: procedures, pain medications.   BP: (!) 144/80  HR: 67  Elaine Clayton comes in today for post-procedure evaluation after the treatment done on 04/30/2018. The patient did have some improvement after her right sided intra-articular hip joint injection and right-sided iliotibial band injection. However, she still having problems with her gait and this is being looked that by neurologist. On 05/20/2018 she went to the emergency room and left against medical advise. Her primary problem since to be that of her gait and her lack of coordination. She seems to be having problems with muscle twitching in her lower extremities, which I witnessed today on her right thigh. Along the way of her workup for her condition, she had a  Lumbar puncture done to examine her CSF. Interestingly enough, after she had some CSF removed, her symptoms improved significantly. This was apparently witnessed by the neurologist Toy Cookey, M.D.), who diagnosed her with truncal ataxia and atypical parkinsonism. However there is a question as to the possibility of a Normal Pressure Hydrocephalus. Dr. Nicki Reaper seems to have put that this possibility in the background as it seems to be very rare.   In evaluating the patient's medications, she takes hydrocodone, approximately half a tablet daily to twice a day. She indicates that when she does, her symptoms improved significantly and she is able to increase her level of activity. However, she does seem to have reservations about the use of this medication that seems to be more psychosocial than associated with any type of side effect or  problem with the pharmacologic agent. Today we had a long conversation with regards to this and the patient was given information about the rules, regulations, and even information about CBD. The patient was informed to avoid taking opioids and benzodiazepines at the same time. She indicates taking Xanax 0.5 daily at bedtime. She also indicates that she does not take the hydrocodone with it. I have instructed her to avoid taking the hydrocodone within 8 hours of having taken the benzodiazepine and also to avoid taking the benzodiazepine within 4-6 hours after taking the hydrocodone. Both the patient, and her husband, who was present during the entire visit, indicated understanding.  At this point, I would like to minimize the use of injectable steroids to no more than 1 every other month, but only if needed. She is pending to have further studies to determining the cause of her ataxia. Today I have provided her with a single prescription for the hydrocodone, where she could either take 1 pill twice a day or half a pill up to 4 times a day. I have given the patient this option since she already takes only half a pill at a time. She indicated that she will give it a try and I will see her back close to the time where she will be needing a refill. She still has some medication left from her last prescription and at this point what I need to figure out is how much medication she needs to increase her level of activity, continue being functional, and avoid side effects. In terms of those, I have talked to the patient about the constipation and opioids and she has indicated understanding and being aware of it. She is currently taking some Dulcolax to treat her constipation. There were given instructions to call whenever they are down to a 2 week supply  of her pain medicine so that we can bring her in and do a refill. As well as I can figure out how much she needs, I will then try to provide her with a 3 month refill and  we will continue to monitor her medication use at that particular interval.  Further details on both, my assessment(s), as well as the proposed treatment plan, please see below.  Post-Procedure Assessment  04/29/2018 Procedure: Diagnostic/therapeutic right sided intra-articular hip joint injection #2 + right-sided iliotibial band injection #1, under fluoroscopic guidance, no sedation Pre-procedure pain score:  7/10 Post-procedure pain score: 0/10         Influential Factors: BMI: 17.58 kg/m Intra-procedural challenges: None observed.         Assessment challenges: None detected.              Reported side-effects: None.        Post-procedural adverse reactions or complications: None reported         Sedation: No sedation used. When no sedatives are used, the analgesic levels obtained are directly associated to the effectiveness of the local anesthetics. However, when sedation is provided, the level of analgesia obtained during the initial 1 hour following the intervention, is believed to be the result of a combination of factors. These factors may include, but are not limited to: 1. The effectiveness of the local anesthetics used. 2. The effects of the analgesic(s) and/or anxiolytic(s) used. 3. The degree of discomfort experienced by the patient at the time of the procedure. 4. The patients ability and reliability in recalling and recording the events. 5. The presence and influence of possible secondary gains and/or psychosocial factors. Reported result: Relief experienced during the 1st hour after the procedure: 50 % (Ultra-Short Term Relief)            Interpretative annotation: Clinically appropriate result. No IV Analgesic or Anxiolytic given, therefore benefits are completely due to Local Anesthetic effects.          Effects of local anesthetic: The analgesic effects attained during this period are directly associated to the localized infiltration of local anesthetics and therefore cary  significant diagnostic value as to the etiological location, or anatomical origin, of the pain. Expected duration of relief is directly dependent on the pharmacodynamics of the local anesthetic used. Long-acting (4-6 hours) anesthetics used.  Reported result: Relief during the next 4 to 6 hour after the procedure: 50 % (Short-Term Relief)            Interpretative annotation: Clinically appropriate result. Analgesia during this period is likely to be Local Anesthetic-related.          Long-term benefit: Defined as the period of time past the expected duration of local anesthetics (1 hour for short-acting and 4-6 hours for long-acting). With the possible exception of prolonged sympathetic blockade from the local anesthetics, benefits during this period are typically attributed to, or associated with, other factors such as analgesic sensory neuropraxia, antiinflammatory effects, or beneficial biochemical changes provided by agents other than the local anesthetics.  Reported result: Extended relief following procedure: 80 %(pain relief got better as time went on.  at approx 7 - 10 days pain was the least and the pain is gradually coming back) (Long-Term Relief)            Interpretative annotation: Clinically appropriate result. Good relief. No permanent benefit expected. Inflammation plays a part in the etiology to the pain.  Current benefits: Defined as reported results that persistent at this point in time.   Analgesia: >75 % Elaine Clayton reports improvement of axial symptoms. Function: Somewhat improved ROM: Somewhat improved Interpretative annotation: Recurrence of symptoms. Therapeutic benefit observed. Results would suggest persistent aggravating factors. Benefit could be steroid-related.  Interpretation: Results would suggest a successful diagnostic intervention.                  Plan:  Set up procedure as a PRN palliative treatment option for this patient.                Laboratory  Chemistry  Inflammation Markers (CRP: Acute Phase) (ESR: Chronic Phase) Lab Results  Component Value Date   CRP <0.8 03/19/2017   ESRSEDRATE 4 03/19/2017                         Renal Markers Lab Results  Component Value Date   BUN 14 05/20/2018   CREATININE 0.92 05/20/2018   GFRAA >60 05/20/2018   GFRNONAA >60 05/20/2018                             Hepatic Markers Lab Results  Component Value Date   AST 27 05/20/2018   ALT 18 05/20/2018   ALBUMIN 4.4 05/20/2018                        Neuropathy Markers No results found.  Hematology Parameters Lab Results  Component Value Date   INR 0.88 05/20/2018   LABPROT 11.9 05/20/2018   APTT 29 05/20/2018   PLT 285 05/20/2018   HGB 13.8 05/20/2018   HCT 41.3 05/20/2018                        CV Markers Lab Results  Component Value Date   TROPONINI <0.03 05/20/2018                         Note: Lab results reviewed.  Recent Diagnostic Imaging Results  CT HEAD WO CONTRAST CLINICAL DATA:  Numbness around the mouth.  EXAM: CT HEAD WITHOUT CONTRAST  TECHNIQUE: Contiguous axial images were obtained from the base of the skull through the vertex without intravenous contrast.  COMPARISON:  03/19/2018  FINDINGS: Brain: No evidence of acute infarction, hemorrhage, extra-axial collection, ventriculomegaly, or mass effect. Generalized cerebral atrophy. Periventricular white matter low attenuation likely secondary to microangiopathy.  Vascular: Cerebrovascular atherosclerotic calcifications are noted.  Skull: Negative for fracture or focal lesion.  Sinuses/Orbits: Visualized portions of the orbits are unremarkable. Visualized portions of the paranasal sinuses and mastoid air cells are unremarkable.  Other: None.  IMPRESSION: No acute intracranial pathology.  Electronically Signed   By: Kathreen Devoid   On: 05/20/2018 15:38  Complexity Note: I personally reviewed the fluoroscopic imaging of the procedure.                         Meds   Current Outpatient Medications:  .  ALPRAZolam (XANAX) 0.5 MG tablet, Take 0.5 mg by mouth 2 (two) times daily as needed for sleep. 0.5-1 tablet, Disp: , Rfl:  .  AZOPT 1 % ophthalmic suspension, Place 1 drop into the left eye. , Disp: , Rfl: 0 .  bisacodyl (DULCOLAX) 5 MG EC tablet, Take 5 mg by mouth daily  as needed for moderate constipation., Disp: , Rfl:  .  Brimonidine Tartrate-Timolol (COMBIGAN OP), Apply 1 drop to eye 2 (two) times daily., Disp: , Rfl:  .  buPROPion (WELLBUTRIN SR) 150 MG 12 hr tablet, Take 150 mg by mouth every morning. 2 tablets, Disp: , Rfl:  .  dextromethorphan 15 MG/5ML syrup, Take 10 mLs by mouth 4 (four) times daily as needed for cough., Disp: , Rfl:  .  escitalopram (LEXAPRO) 10 MG tablet, Take 10 mg by mouth daily. , Disp: , Rfl: 0 .  estradiol-levonorgestrel (CLIMARA PRO) 0.045-0.015 MG/DAY, apply 1 patch every week, Disp: 12 patch, Rfl: 4 .  fluticasone furoate-vilanterol (BREO ELLIPTA) 100-25 MCG/INH AEPB, Inhale 1 puff into the lungs daily., Disp: 3 each, Rfl: 3 .  gabapentin (NEURONTIN) 100 MG capsule, Take 1-3 capsules (100-300 mg total) by mouth at bedtime., Disp: 90 capsule, Rfl: 0 .  HYDROcodone-acetaminophen (NORCO/VICODIN) 5-325 MG tablet, Take 1 tablet by mouth every 6 (six) hours as needed for moderate pain., Disp: 60 tablet, Rfl: 0 .  ipratropium (ATROVENT) 0.06 % nasal spray, Place 2 sprays into both nostrils 4 (four) times daily as needed for rhinitis., Disp: , Rfl:  .  LUMIGAN 0.01 % SOLN, apply 1 drop to into both  eyes at bedtime once daily ONLY, Disp: , Rfl: 0 .  mirabegron ER (MYRBETRIQ) 25 MG TB24 tablet, Take 1 tablet (25 mg total) by mouth daily., Disp: 90 tablet, Rfl: 3 .  nicotine polacrilex (NICORETTE) 2 MG gum, Take 2 mg by mouth as needed for smoking cessation., Disp: , Rfl:  .  ondansetron (ZOFRAN) 8 MG tablet, Take by mouth every 8 (eight) hours as needed for nausea or vomiting., Disp: , Rfl:  .  rosuvastatin  (CRESTOR) 10 MG tablet, Take 10 mg by mouth at bedtime. , Disp: , Rfl:  .  VENTOLIN HFA 108 (90 Base) MCG/ACT inhaler, inhale 2 puffs by mouth INTO THE LUNGS every 6 hours if needed for wheezing or shortness of breath, Disp: 18 g, Rfl: 6  ROS  Constitutional: Denies any fever or chills Gastrointestinal: No reported hemesis, hematochezia, vomiting, or acute GI distress Musculoskeletal: Denies any acute onset joint swelling, redness, loss of ROM, or weakness Neurological: No reported episodes of acute onset apraxia, aphasia, dysarthria, agnosia, amnesia, paralysis, loss of coordination, or loss of consciousness  Allergies  Elaine Clayton is allergic to influenza vaccines; flu virus vaccine; gabapentin; sulfa antibiotics; and tetracyclines & related.  [place]  Drug: Elaine Clayton  reports that she does not use drugs. Alcohol:  reports that she drinks alcohol. Tobacco:  reports that she quit smoking about 10 years ago. She has a 35.00 pack-year smoking history. She has never used smokeless tobacco. Medical:  has a past medical history of Anemia, Ataxia, Bipolar affective disorder ([place]), Broken foot (2015), Chronic gastritis, Chronic kidney disease, COPD (chronic obstructive pulmonary disease) ([place]), Dizziness, Dysphagia, Glaucoma, Hyperlipemia, Hypertension, Lithium toxicity (2015), Malignant neoplasm of upper-outer quadrant of female breast [facility]) (May 07, 2007), Neck stiffness, Osteoarthritis, Pancreatitis, Parkinson disease ([place]), Personal history of malignant neoplasm of breast, Pituitary microadenoma ([place]), Recurrent UTI, Renal insufficiency, and Stomach ulcer ([place]). Surgical: Elaine Clayton  has a past surgical history that includes Colonoscopy (2004); Neck surgery (2006); Eye surgery (2013); Appendectomy; Tubal ligation; Colonoscopy (N/A, 05/14/2015); Esophagogastroduodenoscopy (N/A, 05/14/2015); Savory dilation (N/A, 05/14/2015); ERCP (N/A); Glaucoma surgery (Right, 2013); cataract (Right, 2013); Breast  surgery (Left,  2013); Breast surgery (Right, November 06, 1999); Breast surgery (Left, May 07, 2007); EUS (N/A,  2011, 2012); Laparoscopic right colectomy (Right, 06/22/2015); right breast cancer; Trabeculectomy (Left, 03/05/2016); Cataract extraction w/PHACO (Left, 03/05/2016); Breast biopsy (Right, 2011); Breast excisional biopsy (Right, 2001); Breast excisional biopsy (Right, 2008); and Breast excisional biopsy (Left, 10/26/2012). Family: family history includes Breast cancer (age of onset: 56) in her mother.  Constitutional Exam  General appearance: Well nourished, well developed, and well hydrated. In no apparent acute distress Vitals:   05/24/18 1129  BP: (!) 144/80  Pulse: 67  Resp: 16  Temp: 98.5 F (36.9 C)  TempSrc: Oral  SpO2: 100%  Weight: 104 lb (47.2 kg)  Height: 5' 4.5" (1.638 m)   BMI Assessment: Estimated body mass index is 17.58 kg/m as calculated from the following:   Height as of this encounter: 5' 4.5" (1.638 m).   Weight as of this encounter: 104 lb (47.2 kg).  BMI interpretation table: BMI level Category Range association with higher incidence of chronic pain  <18 kg/m2 Underweight   18.5-24.9 kg/m2 Ideal body weight   25-29.9 kg/m2 Overweight Increased incidence by 20%  30-34.9 kg/m2 Obese (Class I) Increased incidence by 68%  35-39.9 kg/m2 Severe obesity (Class II) Increased incidence by 136%  >40 kg/m2 Extreme obesity (Class III) Increased incidence by 254%   Patient's current BMI Ideal Body weight  Body mass index is 17.58 kg/m. Ideal body weight: 55.8 kg (123 lb 2 oz)   BMI Readings from Last 4 Encounters:  05/24/18 17.58 kg/m  05/20/18 17.31 kg/m  05/10/18 17.31 kg/m  04/29/18 17.47 kg/m   Wt Readings from Last 4 Encounters:  05/24/18 104 lb (47.2 kg)  05/20/18 104 lb (47.2 kg)  05/10/18 104 lb (47.2 kg)  04/29/18 105 lb (47.6 kg)  Psych/Mental status: Alert, oriented x 3 (person, place, & time)       Eyes: PERLA Respiratory: No evidence of  acute respiratory distress  Cervical Spine Area Exam  Skin & Axial Inspection: No masses, redness, edema, swelling, or associated skin lesions Alignment: Symmetrical Functional ROM: Unrestricted ROM      Stability: No instability detected Muscle Tone/Strength: Functionally intact. No obvious neuro-muscular anomalies detected. Sensory (Neurological): Unimpaired Palpation: No palpable anomalies              Upper Extremity (UE) Exam    Side: Right upper extremity  Side: Left upper extremity  Skin & Extremity Inspection: Skin color, temperature, and hair growth are WNL. No peripheral edema or cyanosis. No masses, redness, swelling, asymmetry, or associated skin lesions. No contractures.  Skin & Extremity Inspection: Skin color, temperature, and hair growth are WNL. No peripheral edema or cyanosis. No masses, redness, swelling, asymmetry, or associated skin lesions. No contractures.  Functional ROM: Unrestricted ROM          Functional ROM: Unrestricted ROM          Muscle Tone/Strength: Functionally intact. No obvious neuro-muscular anomalies detected.  Muscle Tone/Strength: Functionally intact. No obvious neuro-muscular anomalies detected.  Sensory (Neurological): Unimpaired          Sensory (Neurological): Unimpaired          Palpation: No palpable anomalies              Palpation: No palpable anomalies              Provocative Test(s):  Phalen's test: deferred Tinel's test: deferred Apley's scratch test (touch opposite shoulder):  Action 1 (Across chest): deferred Action 2 (Overhead): deferred Action 3 (LB reach): deferred   Provocative Test(s):  Phalen's test: deferred  Tinel's test: deferred Apley's scratch test (touch opposite shoulder):  Action 1 (Across chest): deferred Action 2 (Overhead): deferred Action 3 (LB reach): deferred    Thoracic Spine Area Exam  Skin & Axial Inspection: No masses, redness, or swelling Alignment: Symmetrical Functional ROM: Unrestricted  ROM Stability: No instability detected Muscle Tone/Strength: Functionally intact. No obvious neuro-muscular anomalies detected. Sensory (Neurological): Unimpaired Muscle strength & Tone: No palpable anomalies  Lumbar Spine Area Exam  Skin & Axial Inspection: No masses, redness, or swelling Alignment: Symmetrical Functional ROM: Unrestricted ROM       Stability: No instability detected Muscle Tone/Strength: Functionally intact. No obvious neuro-muscular anomalies detected. Sensory (Neurological): Unimpaired Palpation: No palpable anomalies       Provocative Tests: Lumbar Hyperextension/rotation test: deferred today       Lumbar quadrant test (Kemp's test): deferred today       Lumbar Lateral bending test: deferred today       Patrick's Maneuver: deferred today                   FABER test: deferred today       Thigh-thrust test: deferred today       S-I compression test: deferred today       S-I distraction test: deferred today        Gait & Posture Assessment  Ambulation: Patient ambulates using a cane Gait: Parkinsonian, truncal ataxia Posture: Antalgic   Lower Extremity Exam    Side: Right lower extremity  Side: Left lower extremity  Stability: No instability observed          Stability: No instability observed          Skin & Extremity Inspection: Skin color, temperature, and hair growth are WNL. No peripheral edema or cyanosis. No masses, redness, swelling, asymmetry, or associated skin lesions. No contractures.  Skin & Extremity Inspection: Skin color, temperature, and hair growth are WNL. No peripheral edema or cyanosis. No masses, redness, swelling, asymmetry, or associated skin lesions. No contractures.  Functional ROM: Decreased ROM for hip and knee joints          Functional ROM: Decreased ROM for hip and knee joints          Muscle Tone/Strength: Muscle twitching observed over her right thigh during today's appointment  Muscle Tone/Strength: Functionally intact. No  obvious neuro-muscular anomalies detected.  Sensory (Neurological): Movement-associated discomfort  Sensory (Neurological): Movement-associated discomfort  Palpation: No palpable anomalies  Palpation: No palpable anomalies   Assessment  Primary Diagnosis & Pertinent Problem List: The primary encounter diagnosis was Chronic low back pain (Primary Area of Pain) (Bilateral) (L>R). Diagnoses of Chronic lower extremity pain (Secondary Area of Pain) (Bilateral) (L>R), Chronic neck pain (Tertiary Area of Pain) (Bilateral) (L>R), Chronic shoulder pain (Fourth Area of Pain) (Bilateral) (L>R), Ataxic gait (Associated w/ CSF Pressure), Atypical Parkinsonism (HCC), Chronic pain syndrome, and Neurogenic pain were also pertinent to this visit.  Status Diagnosis  Persistent Persistent Persistent 1. Chronic low back pain (Primary Area of Pain) (Bilateral) (L>R)   2. Chronic lower extremity pain (Secondary Area of Pain) (Bilateral) (L>R)   3. Chronic neck pain (Tertiary Area of Pain) (Bilateral) (L>R)   4. 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 Chronic pain syndrome   8. Neurogenic pain     Problems updated and reviewed during this visit: No problems updated.   Time Note:  Greater than 50% of the 40 minute(s) of face-to-face time spent with Elaine Clayton, was spent in counseling/coordination of care regarding: opioid tolerance, the results of her recent test(s), the treatment plan, treatment alternatives, medication side effects, the opioid analgesic risks and possible complications, realistic expectations, the goals of pain management (increased in functionality), the medication agreement, the patient's responsibilities when it comes to controlled substances and the need to collect and read the AVS material.  Plan of Care  Pharmacotherapy (Medications Ordered): Meds ordered this encounter  Medications   . HYDROcodone-acetaminophen (NORCO/VICODIN) 5-325 MG tablet    Sig: Take 1 tablet by mouth every 6 (six) hours as needed for moderate pain.    Dispense:  60 tablet    Refill:  0    Fill one day early if pharmacy is closed on scheduled refill date. Do not fill until: 05/24/18 To last until: 06/23/18  . gabapentin (NEURONTIN) 100 MG capsule    Sig: Take 1-3 capsules (100-300 mg total) by mouth at bedtime.    Dispense:  90 capsule    Refill:  0    Do not place this medication, or any other prescription from our practice, on "Automatic Refill". Patient may have prescription filled one day early if pharmacy is closed on scheduled refill date.   Medications administered today: Elaine Clayton had no medications administered during this visit.  Procedure Orders    No procedure(s) ordered today   Lab Orders  No laboratory test(s) ordered today   Imaging Orders  No imaging studies ordered today   Referral Orders  No referral(s) requested today   Interventional management options: Planned, scheduled, and/or pending:   None at this time. Hydrocodone trial.    Considering:   Diagnostic bilateral lumbar facet block Possible bilateral lumbar facet RFA Diagnostic bilateral sacroiliac joint block Possible bilateral sacroiliac joint RFA Diagnostic left cervical epidural steroid injection Diagnostic left L4-5 interlaminar lumbar epidural steroid injection Diagnostic left L5-S1 transforaminal epidural steroid injection   Palliative PRN treatment(s):   Palliative left cervical epidural steroid injection#2 Palliativeleft L4-5 interlaminar lumbar epidural steroid injection + left L5-S1 transforaminal   Provider-requested follow-up: No follow-ups on file.  Future Appointments  Date Time Provider [place]  05/25/2018 12:00 PM Larae Grooms, PTA [facility] None  06/01/2018 12:00 PM Cleophus Molt E, PTA [facility] None  06/08/2018 11:00 AM Hillis Range, PT [facility]  None  06/15/2018 12:00 PM Cleophus Molt E, PTA [facility] None  06/22/2018 11:30 AM Hillis Range, PT [facility] None  06/29/2018 10:30 AM Cleophus Molt E, PTA [facility] None  07/06/2018 11:45 AM Hillis Range, PT [facility] None  07/12/2018  1:00 PM [facility]  07/13/2018 11:15 AM Cleophus Molt E, PTA [facility] None  07/20/2018 11:45 AM Hillis Range, PT [facility] None  03/23/2019  1:30 PM McGowan, Marlowe Kays None   Primary Care Physician: Idelle Crouch, MD Location: [facility] Note by: Gaspar Cola, MD Date: 05/24/2018; Time: 1:46 PM

## 2018-05-24 ENCOUNTER — Encounter: Payer: Self-pay | Admitting: Pain Medicine

## 2018-05-24 ENCOUNTER — Ambulatory Visit: Payer: Medicare Other | Attending: Pain Medicine | Admitting: Pain Medicine

## 2018-05-24 VITALS — BP 144/80 | HR 67 | Temp 98.5°F | Resp 16 | Ht 64.5 in | Wt 104.0 lb

## 2018-05-24 DIAGNOSIS — M25511 Pain in right shoulder: Secondary | ICD-10-CM | POA: Diagnosis not present

## 2018-05-24 DIAGNOSIS — M792 Neuralgia and neuritis, unspecified: Secondary | ICD-10-CM | POA: Diagnosis not present

## 2018-05-24 DIAGNOSIS — M5441 Lumbago with sciatica, right side: Secondary | ICD-10-CM | POA: Diagnosis not present

## 2018-05-24 DIAGNOSIS — M542 Cervicalgia: Secondary | ICD-10-CM | POA: Diagnosis not present

## 2018-05-24 DIAGNOSIS — M25551 Pain in right hip: Secondary | ICD-10-CM | POA: Insufficient documentation

## 2018-05-24 DIAGNOSIS — Z887 Allergy status to serum and vaccine status: Secondary | ICD-10-CM | POA: Insufficient documentation

## 2018-05-24 DIAGNOSIS — M79604 Pain in right leg: Secondary | ICD-10-CM | POA: Diagnosis not present

## 2018-05-24 DIAGNOSIS — Z9889 Other specified postprocedural states: Secondary | ICD-10-CM | POA: Insufficient documentation

## 2018-05-24 DIAGNOSIS — M25512 Pain in left shoulder: Secondary | ICD-10-CM | POA: Diagnosis not present

## 2018-05-24 DIAGNOSIS — Z888 Allergy status to other drugs, medicaments and biological substances status: Secondary | ICD-10-CM | POA: Diagnosis not present

## 2018-05-24 DIAGNOSIS — M4804 Spinal stenosis, thoracic region: Secondary | ICD-10-CM | POA: Diagnosis not present

## 2018-05-24 DIAGNOSIS — Z87891 Personal history of nicotine dependence: Secondary | ICD-10-CM | POA: Insufficient documentation

## 2018-05-24 DIAGNOSIS — E785 Hyperlipidemia, unspecified: Secondary | ICD-10-CM | POA: Diagnosis not present

## 2018-05-24 DIAGNOSIS — R278 Other lack of coordination: Secondary | ICD-10-CM | POA: Diagnosis not present

## 2018-05-24 DIAGNOSIS — G20 Parkinson's disease: Secondary | ICD-10-CM | POA: Insufficient documentation

## 2018-05-24 DIAGNOSIS — D352 Benign neoplasm of pituitary gland: Secondary | ICD-10-CM | POA: Insufficient documentation

## 2018-05-24 DIAGNOSIS — M21372 Foot drop, left foot: Secondary | ICD-10-CM | POA: Insufficient documentation

## 2018-05-24 DIAGNOSIS — R260 Ataxic gait: Secondary | ICD-10-CM

## 2018-05-24 DIAGNOSIS — J449 Chronic obstructive pulmonary disease, unspecified: Secondary | ICD-10-CM | POA: Diagnosis not present

## 2018-05-24 DIAGNOSIS — Z9049 Acquired absence of other specified parts of digestive tract: Secondary | ICD-10-CM | POA: Insufficient documentation

## 2018-05-24 DIAGNOSIS — K2950 Unspecified chronic gastritis without bleeding: Secondary | ICD-10-CM | POA: Insufficient documentation

## 2018-05-24 DIAGNOSIS — C50411 Malignant neoplasm of upper-outer quadrant of right female breast: Secondary | ICD-10-CM | POA: Diagnosis not present

## 2018-05-24 DIAGNOSIS — M79605 Pain in left leg: Secondary | ICD-10-CM | POA: Diagnosis not present

## 2018-05-24 DIAGNOSIS — G894 Chronic pain syndrome: Secondary | ICD-10-CM

## 2018-05-24 DIAGNOSIS — D649 Anemia, unspecified: Secondary | ICD-10-CM | POA: Insufficient documentation

## 2018-05-24 DIAGNOSIS — M5442 Lumbago with sciatica, left side: Secondary | ICD-10-CM | POA: Diagnosis not present

## 2018-05-24 DIAGNOSIS — N289 Disorder of kidney and ureter, unspecified: Secondary | ICD-10-CM | POA: Diagnosis not present

## 2018-05-24 DIAGNOSIS — G8929 Other chronic pain: Secondary | ICD-10-CM

## 2018-05-24 DIAGNOSIS — M25552 Pain in left hip: Secondary | ICD-10-CM | POA: Insufficient documentation

## 2018-05-24 DIAGNOSIS — Z79899 Other long term (current) drug therapy: Secondary | ICD-10-CM | POA: Insufficient documentation

## 2018-05-24 DIAGNOSIS — G20C Parkinsonism, unspecified: Secondary | ICD-10-CM

## 2018-05-24 DIAGNOSIS — M5116 Intervertebral disc disorders with radiculopathy, lumbar region: Secondary | ICD-10-CM | POA: Diagnosis not present

## 2018-05-24 DIAGNOSIS — R531 Weakness: Secondary | ICD-10-CM | POA: Insufficient documentation

## 2018-05-24 DIAGNOSIS — Z79891 Long term (current) use of opiate analgesic: Secondary | ICD-10-CM | POA: Diagnosis not present

## 2018-05-24 DIAGNOSIS — I129 Hypertensive chronic kidney disease with stage 1 through stage 4 chronic kidney disease, or unspecified chronic kidney disease: Secondary | ICD-10-CM | POA: Diagnosis not present

## 2018-05-24 DIAGNOSIS — K861 Other chronic pancreatitis: Secondary | ICD-10-CM | POA: Diagnosis not present

## 2018-05-24 DIAGNOSIS — M48061 Spinal stenosis, lumbar region without neurogenic claudication: Secondary | ICD-10-CM | POA: Insufficient documentation

## 2018-05-24 DIAGNOSIS — R1310 Dysphagia, unspecified: Secondary | ICD-10-CM | POA: Insufficient documentation

## 2018-05-24 DIAGNOSIS — K859 Acute pancreatitis without necrosis or infection, unspecified: Secondary | ICD-10-CM | POA: Diagnosis not present

## 2018-05-24 DIAGNOSIS — Z882 Allergy status to sulfonamides status: Secondary | ICD-10-CM | POA: Diagnosis not present

## 2018-05-24 DIAGNOSIS — Z803 Family history of malignant neoplasm of breast: Secondary | ICD-10-CM | POA: Insufficient documentation

## 2018-05-24 DIAGNOSIS — Z86010 Personal history of colonic polyps: Secondary | ICD-10-CM | POA: Diagnosis not present

## 2018-05-24 DIAGNOSIS — Z8719 Personal history of other diseases of the digestive system: Secondary | ICD-10-CM | POA: Insufficient documentation

## 2018-05-24 MED ORDER — GABAPENTIN 100 MG PO CAPS: 100.0000 mg | ORAL_CAPSULE | Freq: Every day | ORAL | 0 refills | Status: DC

## 2018-05-24 MED ORDER — HYDROCODONE-ACETAMINOPHEN 5-325 MG PO TABS: 1.0000 | ORAL_TABLET | Freq: Four times a day (QID) | ORAL | 0 refills | Status: DC | PRN

## 2018-05-24 NOTE — Patient Instructions (Addendum)
____________________________________________________________________________________________  Medication Rules  Applies to: All patients receiving prescriptions (written or electronic).  Pharmacy of record: Pharmacy where electronic prescriptions will be sent. If written prescriptions are taken to a different pharmacy, please inform the nursing staff. The pharmacy listed in the electronic medical record should be the one where you would like electronic prescriptions to be sent.  Prescription refills: Only during scheduled appointments. Applies to both, written and electronic prescriptions.  NOTE: The following applies primarily to controlled substances (Opioid* Pain Medications).   Patient's responsibilities: 1. Pain Pills: Bring all pain pills to every appointment (except for procedure appointments). 2. Pill Bottles: Bring pills in original pharmacy bottle. Always bring newest bottle. Bring bottle, even if empty. 3. Medication refills: You are responsible for knowing and keeping track of what medications you need refilled. The day before your appointment, write a list of all prescriptions that need to be refilled. Bring that list to your appointment and give it to the admitting nurse. Prescriptions will be written only during appointments. If you forget a medication, it will not be "Called in", "Faxed", or "electronically sent". You will need to get another appointment to get these prescribed. 4. Prescription Accuracy: You are responsible for carefully inspecting your prescriptions before leaving our office. Have the discharge nurse carefully go over each prescription with you, before taking them home. Make sure that your name is accurately spelled, that your address is correct. Check the name and dose of your medication to make sure it is accurate. Check the number of pills, and the written instructions to make sure they are clear and accurate. Make sure that you are given enough medication to last  until your next medication refill appointment. 5. Taking Medication: Take medication as prescribed. Never take more pills than instructed. Never take medication more frequently than prescribed. Taking less pills or less frequently is permitted and encouraged, when it comes to controlled substances (written prescriptions).  6. Inform other Doctors: Always inform, all of your healthcare providers, of all the medications you take. 7. Pain Medication from other Providers: You are not allowed to accept any additional pain medication from any other Doctor or Healthcare provider. There are two exceptions to this rule. (see below) In the event that you require additional pain medication, you are responsible for notifying us, as stated below. 8. Medication Agreement: You are responsible for carefully reading and following our Medication Agreement. This must be signed before receiving any prescriptions from our practice. Safely store a copy of your signed Agreement. Violations to the Agreement will result in no further prescriptions. (Additional copies of our Medication Agreement are available upon request.) 9. Laws, Rules, & Regulations: All patients are expected to follow all Federal and State Laws, Statutes, Rules, & Regulations. Ignorance of the Laws does not constitute a valid excuse. The use of any illegal substances is prohibited. 10. Adopted CDC guidelines & recommendations: Target dosing levels will be at or below 60 MME/day. Use of benzodiazepines** is not recommended.  Exceptions: There are only two exceptions to the rule of not receiving pain medications from other Healthcare Providers. 1. Exception #1 (Emergencies): In the event of an emergency (i.e.: accident requiring emergency care), you are allowed to receive additional pain medication. However, you are responsible for: As soon as you are able, call our office [phone], at any time of the day or night, and leave a message stating your name, the  date and nature of the emergency, and the name and dose of the medication   prescribed. In the event that your call is answered by a member of our staff, make sure to document and save the date, time, and the name of the person that took your information.  2. Exception #2 (Planned Surgery): In the event that you are scheduled by another doctor or dentist to have any type of surgery or procedure, you are allowed (for a period no longer than 30 days), to receive additional pain medication, for the acute post-op pain. However, in this case, you are responsible for picking up a copy of our "Post-op Pain Management for Surgeons" handout, and giving it to your surgeon or dentist. This document is available at our office, and does not require an appointment to obtain it. Simply go to our office during business hours (Monday-Thursday from 8:00 AM to 4:00 PM) (Friday 8:00 AM to 12:00 Noon) or if you have a scheduled appointment with us, prior to your surgery, and ask for it by name. In addition, you will need to provide us with your name, name of your surgeon, type of surgery, and date of procedure or surgery.  *Opioid medications include: morphine, codeine, oxycodone, oxymorphone, hydrocodone, hydromorphone, meperidine, tramadol, tapentadol, buprenorphine, fentanyl, methadone. **Benzodiazepine medications include: diazepam (Valium), alprazolam (Xanax), clonazepam (Klonopine), lorazepam (Ativan), clorazepate (Tranxene), chlordiazepoxide (Librium), estazolam (Prosom), oxazepam (Serax), temazepam (Restoril), triazolam (Halcion) (Last updated: 02/18/2018) ____________________________________________________________________________________________   ____________________________________________________________________________________________  Medication Recommendations and Reminders  Applies to: All patients receiving prescriptions (written and/or electronic).  Medication Rules & Regulations: These rules and  regulations exist for your safety and that of others. They are not flexible and neither are we. Dismissing or ignoring them will be considered "non-compliance" with medication therapy, resulting in complete and irreversible termination of such therapy. (See document titled "Medication Rules" for more details.) In all conscience, because of safety reasons, we cannot continue providing a therapy where the patient does not follow instructions.  Pharmacy of record:   Definition: This is the pharmacy where your electronic prescriptions will be sent.   We do not endorse any particular pharmacy.  You are not restricted in your choice of pharmacy.  The pharmacy listed in the electronic medical record should be the one where you want electronic prescriptions to be sent.  If you choose to change pharmacy, simply notify our nursing staff of your choice of new pharmacy.  Recommendations:  Keep all of your pain medications in a safe place, under lock and key, even if you live alone.   After you fill your prescription, take 1 week's worth of pills and put them away in a safe place. You should keep a separate, properly labeled bottle for this purpose. The remainder should be kept in the original bottle. Use this as your primary supply, until it runs out. Once it's gone, then you know that you have 1 week's worth of medicine, and it is time to come in for a prescription refill. If you do this correctly, it is unlikely that you will ever run out of medicine.  To make sure that the above recommendation works, it is very important that you make sure your medication refill appointments are scheduled at least 1 week before you run out of medicine. To do this in an effective manner, make sure that you do not leave the office without scheduling your next medication management appointment. Always ask the nursing staff to show you in your prescription , when your medication will be running out. Then arrange for the  receptionist to get you a return   appointment, at least 7 days before you run out of medicine. Do not wait until you have 1 or 2 pills left, to come in. This is very poor planning and does not take into consideration that we may need to cancel appointments due to bad weather, sickness, or emergencies affecting our staff.  Prescription refills and/or changes in medication(s):   Prescription refills, and/or changes in dose or medication, will be conducted only during scheduled medication management appointments. (Applies to both, written and electronic prescriptions.)  No refills on procedure days. No medication will be changed or started on procedure days. No changes, adjustments, and/or refills will be conducted on a procedure day. Doing so will interfere with the diagnostic portion of the procedure.  No phone refills. No medications will be "called into the pharmacy".  No Fax refills.  No weekend refills.  No Holliday refills.  No after hours refills.  Remember:  Business hours are:  Monday to Thursday 8:00 AM to 4:00 PM Provider's Schedule: Dionisio David, NP - Appointments are:  Medication management: Monday to Thursday 8:00 AM to 4:00 PM Milinda Pointer, MD - Appointments are:  Medication management: Monday and Wednesday 8:00 AM to 4:00 PM Procedure day: Tuesday and Thursday 7:30 AM to 4:00 PM Gillis Santa, MD - Appointments are:  Medication management: Tuesday and Thursday 8:00 AM to 4:00 PM Procedure day: Monday and Wednesday 7:30 AM to 4:00 PM (Last update: 02/18/2018) ____________________________________________________________________________________________   ____________________________________________________________________________________________  CANNABIDIOL (AKA: CBD Oil or Pills)  Applies to: All patients receiving prescriptions of controlled substances (written and/or electronic).  General Information: Cannabidiol (CBD) was discovered in 42. It is one of  some 113 identified cannabinoids in cannabis (Marijuana) plants, accounting for up to 40% of the plant's extract. As of 2018, preliminary clinical research on cannabidiol included studies of anxiety, cognition, movement disorders, and pain.  Cannabidiol is consummed in multiple ways, including inhalation of cannabis smoke or vapor, as an aerosol spray into the cheek, and by mouth. It may be supplied as CBD oil containing CBD as the active ingredient (no added tetrahydrocannabinol (THC) or terpenes), a full-plant CBD-dominant hemp extract oil, capsules, dried cannabis, or as a liquid solution. CBD is thought not have the same psychoactivity as THC, and may affect the actions of THC. Studies suggest that CBD may interact with different biological targets, including cannabinoid receptors and other neurotransmitter receptors. As of 2018 the mechanism of action for its biological effects has not been determined.  In the [place], cannabidiol has a limited approval by the Food and Drug Administration (FDA) for treatment of only two types of epilepsy disorders. The side effects of long-term use of the drug include somnolence, decreased appetite, diarrhea, fatigue, malaise, weakness, sleeping problems, and others.  CBD remains a Schedule I drug prohibited for any use.  Legality: Some manufacturers ship CBD products nationally, an illegal action which the FDA has not enforced in 2018, with CBD remaining the subject of an FDA investigational new drug evaluation, and is not considered legal as a dietary supplement or food ingredient as of December 2018. Federal illegality has made it difficult historically to conduct research on CBD. CBD is openly sold in head shops and health food stores in some states where such sales have not been explicitly legalized.  Warning: Because it is not FDA approved for general use or treatment of pain, it is not required to undergo the same manufacturing controls as prescription  drugs.  This means that the available cannabidiol (CBD) may  be contaminated with THC.  If this is the case, it will trigger a positive urine drug screen (UDS) test for cannabinoids (Marijuana).  Because a positive UDS for illicit substances is a violation of our medication agreement, your opioid analgesics (pain medicine) may be permanently discontinued. (Last update: 03/11/2018) ____________________________________________________________________________________________ Dennis Bast were given one prescription for Hydrocodone and one for Gabapentin today.

## 2018-05-24 NOTE — Progress Notes (Signed)
Safety precautions to be maintained throughout the outpatient stay will include: orient to surroundings, keep bed in low position, maintain call bell within reach at all times, provide assistance with transfer out of bed and ambulation.   Patient reports having been to the ED last Thursday for evaluation of a possible TIA

## 2018-05-25 ENCOUNTER — Other Ambulatory Visit: Payer: Self-pay

## 2018-05-25 ENCOUNTER — Ambulatory Visit: Payer: Medicare Other | Attending: Pain Medicine

## 2018-05-25 DIAGNOSIS — M6281 Muscle weakness (generalized): Secondary | ICD-10-CM | POA: Diagnosis present

## 2018-05-25 DIAGNOSIS — R2689 Other abnormalities of gait and mobility: Secondary | ICD-10-CM | POA: Insufficient documentation

## 2018-05-25 DIAGNOSIS — R2681 Unsteadiness on feet: Secondary | ICD-10-CM | POA: Insufficient documentation

## 2018-05-25 DIAGNOSIS — R296 Repeated falls: Secondary | ICD-10-CM | POA: Diagnosis present

## 2018-05-25 DIAGNOSIS — R278 Other lack of coordination: Secondary | ICD-10-CM | POA: Insufficient documentation

## 2018-05-25 NOTE — Therapy (Signed)
[place] MAIN [facility] SERVICES [address] [place], [place], [address] Phone: [phone]   Fax:  [phone]  Physical Therapy Treatment  Patient Details  Name: Elaine Clayton MRN: [id number] Date of Birth: 03/18/1946 Referring Provider: Dr. Dossie Arbour   Encounter Date: 05/25/2018  PT End of Session - 05/25/18 1550    Visit Number  4    Number of Visits  13    Date for PT Re-Evaluation  07/29/18    PT Start Time  1210    PT Stop Time  1300    PT Time Calculation (min)  50 min    Activity Tolerance  Patient tolerated treatment well;No increased pain    Behavior During Therapy  WFL for tasks assessed/performed       Past Medical History:  Diagnosis Date  . Anemia   . Ataxia   . Bipolar affective disorder ([place])    2  . Broken foot 2015   right foot  . Chronic gastritis   . Chronic kidney disease    Dr Holley Raring  . COPD (chronic obstructive pulmonary disease) ([place])   . Dizziness    inner ear (per pt) several times per week  . Dysphagia   . Glaucoma   . Hyperlipemia   . Hypertension    pt has recently come off BP meds.  MD aware. BP seems stable.  . Lithium toxicity 2015  . 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 Neck stiffness    s/p C3-C4 fusion, limited right turn  . Osteoarthritis    back  . Pancreatitis   . Parkinson disease ([place])   . Personal history of malignant neoplasm of breast   . Pituitary microadenoma ([place])   . Recurrent UTI   . Renal insufficiency   . Stomach ulcer 2112    Past Surgical History:  Procedure Laterality Date  . APPENDECTOMY    . BREAST BIOPSY Right 2011   neg  . BREAST EXCISIONAL BIOPSY Right 2001   neg  . BREAST EXCISIONAL BIOPSY Right 2008   breast ca 2008 radiation  . BREAST EXCISIONAL BIOPSY Left 10/26/2012   neg  . BREAST SURGERY Left  2013   left breast wide excision,Intraductal papilloma, ductal hyperplasia  and sclerosing adenosis. Microcalcifications associated with columnar cell change. No evidence of atypia or malignancy. Margins are unremarkable.  Marland Kitchen BREAST SURGERY Right November 06, 1999   multiple areas of microcalcification showing evidence of sclerosing adenosis and ductal hyperplasia.  Marland Kitchen BREAST SURGERY Left May 07, 2007   wide excision.  . cataract Right 2013  . CATARACT EXTRACTION W/PHACO Left 03/05/2016   Procedure: CATARACT EXTRACTION PHACO AND INTRAOCULAR LENS PLACEMENT (IOC);  Surgeon: Leandrew Koyanagi, MD;  Location: [place];  Service: Ophthalmology;  Laterality: Left;  . COLONOSCOPY  2004  . COLONOSCOPY N/A 05/14/2015   Procedure: COLONOSCOPY;  Surgeon: Manya Silvas, MD;  Location: [facility] ENDOSCOPY;  Service: Endoscopy;  Laterality: N/A;  . ERCP N/A   . ESOPHAGOGASTRODUODENOSCOPY N/A 05/14/2015   Procedure: ESOPHAGOGASTRODUODENOSCOPY (EGD);  Surgeon: Manya Silvas, MD;  Location: [facility] ENDOSCOPY;  Service: Endoscopy;  Laterality: N/A;  . EUS N/A 2011, 2012  . EYE SURGERY  2013  . GLAUCOMA SURGERY Right 2013  . LAPAROSCOPIC RIGHT COLECTOMY Right 06/22/2015   Procedure: LAPAROSCOPIC RIGHT COLECTOMY;  Surgeon: Robert Bellow, MD;  Location: [facility] ORS;  Service: General;  Laterality: Right;  .  NECK SURGERY  2006  . right breast cancer    . SAVORY DILATION N/A 05/14/2015   Procedure: SAVORY DILATION;  Surgeon: Manya Silvas, MD;  Location: [facility] ENDOSCOPY;  Service: Endoscopy;  Laterality: N/A;  . TRABECULECTOMY Left 03/05/2016   Procedure: TRABECULECTOMY WITH [facility] AND [facility] SHUNT;  Surgeon: Leandrew Koyanagi, MD;  Location: [place];  Service: Ophthalmology;  Laterality: Left;  . TUBAL LIGATION      There were no vitals filed for this visit.  Subjective Assessment - 05/25/18 1540    Subjective  Pt noted to have several newer bruises on RLE and bandaged R forearm; questioning reveals recent fall and pt denies greater injury. Mid session during  conversation, pt reveals going to the ED last Thursday evening due to spouse noting pt was having increased balance inssues as well as disorientation. Pt states she resisted going, but spouse insisted. Pt ultimately reveals that pt was diagnosed with a TIA and the ED doctor, whom is well known to pt's spouse, wanted to admit pt at least overnight, but pt refused repeatedly. Pt was discharged after several hours and denies having any imaging of brain or other tests done regarding heart/brain. Pt is unsure, but believes her BP was high. Pt also notes she is no longer taking any BP medication, as she independently decided to do so. Overall, pt response is of no concern regarding symptoms/findings that day; pt states symptoms cleared up by the next day.     Pertinent History  Pt is known to therapy clinic from prior episode. She underwent PT from 08/04/18-04/06/18 including aquatic and land based exercise for a total of 33 visits. She was not compliant during her last therapy episode with performing her home exercise program. At the end of her episode of care her outcome measures were worse than when she started. She recently saw Dr. Dossie Arbour for intraarticular hip injection and IT band injection. She feels like her balance has worsened since ending therapy. At end of last therapy episode pt was provided with an extensive list of community resources however she did not pursue any community based exercise program. She is moving to [employer] sometime in [place] in [place] where she will have access to a pool. Pt with extensive surgical and medical Hx including breast cancer (last surgery 2013), DJD with cervical fusion C3-5 in 2006, bipolar disorder, HTN, OA in bilateral shoulders L>R, hip and SIJ pain x >20 years. She states that she needs the accountability of therapy for her to continue her exercises and wants to start therapy again.       Enters/exits via ramp and assist  *Requires  CGA to MIN A throughout session  Ambulation, blue dumbbells  Fwd 4 L  Side 4 L  Squat at rail, 20x  March in place, blue dumbbells, 2 x 1 min  Lunges, blue dumbbells  Fwd, R/L, 25x ea (performed ipsilateral side at a time)  Side, R/L, 25x ea (performed ipsilateral side at a time)                           PT Education - 05/25/18 1547    Education provided  Yes    Education Details  Safety regarding attention to medical situations that require attention and consideration of spouse/MD advice at that time. Use of AD (pt arrives without AD today). Balance/strength concepts in water. Use of focal points, core engagement and regrouping when losing  balance.     Person(s) Educated  Patient    Methods  Explanation    Comprehension  Verbalized understanding;Need further instruction       PT Short Term Goals - 05/06/18 1124      PT SHORT TERM GOAL #1   Title  Pt will be independent with land and aquatic HEP in order to improve strength and balance in order to decrease fall risk and improve function at home and work.     Baseline  05/06/18: not performing consistently    Time  6    Period  Weeks    Status  New    Target Date  06/17/18      PT SHORT TERM GOAL #2   Title  Pt will appropriately use rollator in order to decrease fall risk and increase community access.     Baseline  Pt has a rollator but doesn't use consistently    Time  12    Period  Weeks    Status  New    Target Date  06/17/18        PT Long Term Goals - 05/06/18 1126      PT LONG TERM GOAL #1   Title  Patient will increase Functional Gait Assessment score to >20/30 as to reduce fall risk and improve dynamic gait safety with community ambulation.    Baseline  05/07/18: 9/30    Time  12    Period  Weeks    Status  New    Target Date  07/29/18      PT LONG TERM GOAL #2   Title  Patient will increase Berg Balance score by > 6 points to demonstrate decreased fall risk during functional  activities.; Deferred as patient's balance is more impaired dynamically and therefore FGA goal more appropriate;     Baseline  05/06/18: 22/56    Time  12    Period  Weeks    Status  New    Target Date  07/29/18      PT LONG TERM GOAL #3   Title  Patient (> 52 years old) will complete five times sit to stand test in < 15 seconds indicating an increased LE strength and improved balance.    Baseline  05/06/18: unable to perform without bilateral UE support    Time  12    Period  Weeks    Status  New    Target Date  07/29/18      PT LONG TERM GOAL #4   Title  Patient will ascend/descend 4 stairs without rail assist independently without loss of balance to improve ability to get in/out of home.     Baseline  05/07/18: able to negotiate reciprocally but requires rail assist    Time  12    Period  Weeks    Status  New    Target Date  07/29/18      PT LONG TERM GOAL #5   Title  --    Baseline  --    Time  --    Period  --    Status  --      PT LONG TERM GOAL #6   Title  --    Baseline  --    Time  --    Period  --    Status  --            Plan - 05/25/18 1550    Clinical Impression Statement  Concerned regarding conversation  with pt regarding recent need for ED visit and apparent discharge AMA. Will discuss with primary PT to ensure appropriateness of continuing PT services without further MD clearance. Encouraged pt to use AD, preferably rw, to reduce risk of falls/injury. Concerned as well with pt lack of concern/disregard concerning medical concerns and safety awareness. Since last completed session of PT, pt overall balance and strength has declined. Pt requires greater guard and assist throughout session and increased support equipment to perform tasks. Continue PT as appropriate to improve strength, balance to improve function and decreased risk of falls/injury.     Rehab Potential  Poor    Clinical Impairments Affecting Rehab Potential  Pt does not use AD and had decreased  knowledge of her limitations, poor insight, poor compliance with home program    PT Frequency  1x / week    PT Duration  12 weeks    PT Treatment/Interventions  ADLs/Self Care Home Management;Aquatic Therapy;Biofeedback;Canalith Repostioning;Cryotherapy;Moist Heat;Cognitive remediation;Neuromuscular re-education;Balance training;Therapeutic exercise;Therapeutic activities;Functional mobility training;Stair training;Gait training;DME Instruction;Patient/family education;Orthotic Fit/Training;Manual techniques;Passive range of motion;Visual/perceptual remediation/compensation;Vestibular;Energy conservation;Dry needling    PT Next Visit Plan  Provide extensive written HEP for land exercises, continue balance and strengthening     PT Home Exercise Plan  Extensive aquatic exercises provided (see pt information), will issue land based HEP at next session    Consulted and Agree with Plan of Care  Patient       Patient will benefit from skilled therapeutic intervention in order to improve the following deficits and impairments:  Abnormal gait, Decreased balance, Decreased safety awareness, Decreased knowledge of use of DME, Decreased knowledge of precautions, Decreased endurance, Decreased strength, Difficulty walking, Postural dysfunction, Pain, Decreased cognition, Decreased coordination  Visit Diagnosis: Unsteadiness on feet  Repeated falls  Muscle weakness (generalized)  Other lack of coordination  Other abnormalities of gait and mobility     Problem List Patient Active Problem List   Diagnosis Date Noted  . Osteoarthritis of hip (Right) 04/29/2018  . Iliotibial band syndrome (Right) 04/29/2018  . Allodynia 04/14/2018  . Spondylosis without myelopathy or radiculopathy, lumbosacral region 03/11/2018  . Other specified dorsopathies, sacral and sacrococcygeal region 03/11/2018  . Ataxic gait (Associated w/ CSF Pressure) 11/20/2017  . Gait instability 11/20/2017  . 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 Cognitive decline 11/20/2017  . PBA (pseudobulbar affect) 11/20/2017  . Truncal ataxia 11/20/2017  . Lumbar facet syndrome (Bilateral) (L>R) 07/29/2017  . Chronic sacroiliac joint pain (Bilateral) (L>R) 07/29/2017  . Chronic hip pain (Bilateral) (L>R) 07/29/2017  . Chronic lower extremity pain (Secondary Area of Pain) (Bilateral) (L>R) 07/29/2017  . Impairment of balance 07/07/2017  . Menopausal and female climacteric states 06/15/2017  . Lumbar foraminal stenosis (T12-S1, multilevel) 06/10/2017  . Lumbar lateral recess stenosis (left L2-3, bilateral L3-4, & right L4-5) 06/10/2017  . Grade 1 Anterolisthesis of L4 over L5 and L5 over S1 06/10/2017  . Lumbar facet hypertrophy (multilevel) 06/10/2017  . Lumbar Ligamentum flavum hypertrophy (HCC) (multilevel) 06/10/2017  . Dropfoot (Left) (L5 radiculopathy) 06/10/2017  . Neurogenic pain 06/09/2017  . DDD (degenerative disc disease), lumbar 06/09/2017  . 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 Closed fracture of metatarsal bone 05/06/2017  . 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 Abnormal MRI, cervical spine 05/06/2017  . 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[place]) 05/06/2017  . Cervical radiculitis 05/06/2017  . Chronic lumbar radiculopathy (Bilateral) (L>R) (left L5) 05/06/2017  . Chronic low  back pain (Primary Area of Pain) (Bilateral) (L>R) 05/06/2017  . Abnormal MRI, lumbar spine 05/06/2017  . Chronic pain syndrome 03/19/2017  . Chronic shoulder pain (Fourth Area of Pain) (Bilateral) (L>R) 03/19/2017  . Osteoarthritis of shoulder (Bilateral) 03/19/2017  . Chronic neck pain [facility] Area of Pain) (Bilateral) (L>R) 03/19/2017  . History of C3-5 ACDF 03/19/2017  . Numbness and tingling in left upper extremity 03/19/2017  . Numbness and tingling of right upper extremity 03/19/2017  . Upper extremity weakness  (Bilateral) (L>R) 03/19/2017  . Numbness of upper extremity (Bilateral) (L>R) 03/19/2017  . Numbness and tingling of lower extremity (Bilateral) (L>R) 03/19/2017  . Weakness of lower extremity (Bilateral) (R>L) 03/19/2017  . Chronic abdominal pain (epigastric) 03/19/2017  . Pituitary microadenoma ([place]) 12/08/2016  . History of breast cancer in adulthood 12/08/2016  . Dysmetria 12/08/2016  . Loss of weight 08/15/2015  . Colon polyp 05/25/2015  . Personal history of malignant neoplasm of breast 07/18/2014  . Breast cancer ([place]) 06/28/2014  . Bipolar disorder ([place]) 06/28/2014  . Chronic pancreatitis ([place]) 06/28/2014  . HTN (hypertension) 06/28/2014  . OA (osteoarthritis) 06/28/2014  . Renal insufficiency 06/28/2014  . DDD (degenerative disc disease), cervical 06/28/2014  . Hyperlipidemia 06/28/2014  . COPD (chronic obstructive pulmonary disease) ([place]) 05/17/2014  . Breast cancer of upper-outer quadrant of right female breast ([place]) 05/31/2008    Larae Grooms 05/25/2018, 4:03 PM  [place] MAIN [facility] SERVICES [address] [place], [place], [address] Phone: [phone]   Fax:  [phone]  Name: Elaine Clayton MRN: [id number] Date of Birth: 08-15-1946

## 2018-06-01 ENCOUNTER — Other Ambulatory Visit: Payer: Self-pay

## 2018-06-01 ENCOUNTER — Ambulatory Visit: Payer: Medicare Other

## 2018-06-01 DIAGNOSIS — R2681 Unsteadiness on feet: Secondary | ICD-10-CM | POA: Diagnosis not present

## 2018-06-01 DIAGNOSIS — R2689 Other abnormalities of gait and mobility: Secondary | ICD-10-CM

## 2018-06-01 DIAGNOSIS — R278 Other lack of coordination: Secondary | ICD-10-CM

## 2018-06-01 DIAGNOSIS — M6281 Muscle weakness (generalized): Secondary | ICD-10-CM

## 2018-06-01 DIAGNOSIS — R296 Repeated falls: Secondary | ICD-10-CM

## 2018-06-01 NOTE — Therapy (Signed)
[place] MAIN [facility] SERVICES [address] [place], [place], [address] Phone: [phone]   Fax:  [phone]  Physical Therapy Treatment  Patient Details  Name: Elaine Clayton MRN: [id number] Date of Birth: [birth year] Referring Provider: Dr. Dossie Arbour   Encounter Date: 06/01/2018  PT End of Session - 06/01/18 1541    Visit Number  5    Number of Visits  13    Date for PT Re-Evaluation  07/29/18    PT Start Time  1205    PT Stop Time  1300    PT Time Calculation (min)  55 min    Activity Tolerance  Patient tolerated treatment well;No increased pain    Behavior During Therapy  WFL for tasks assessed/performed       Past Medical History:  Diagnosis Date  . Anemia   . Ataxia   . Bipolar affective disorder ([place])    2  . Broken foot 2015   right foot  . Chronic gastritis   . Chronic kidney disease    Dr Holley Raring  . COPD (chronic obstructive pulmonary disease) ([place])   . Dizziness    inner ear (per pt) several times per week  . Dysphagia   . Glaucoma   . Hyperlipemia   . Hypertension    pt has recently come off BP meds.  MD aware. BP seems stable.  . Lithium toxicity 2015  . 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 Neck stiffness    s/p C3-C4 fusion, limited right turn  . Osteoarthritis    back  . Pancreatitis   . Parkinson disease ([place])   . Personal history of malignant neoplasm of breast   . Pituitary microadenoma ([place])   . Recurrent UTI   . Renal insufficiency   . Stomach ulcer 2112    Past Surgical History:  Procedure Laterality Date  . APPENDECTOMY    . BREAST BIOPSY Right 2011   neg  . BREAST EXCISIONAL BIOPSY Right 2001   neg  . BREAST EXCISIONAL BIOPSY Right 2008   breast ca 2008 radiation  . BREAST EXCISIONAL BIOPSY Left 10/26/2012   neg  . BREAST SURGERY Left  2013   left breast wide excision,Intraductal papilloma, ductal hyperplasia  and sclerosing adenosis. Microcalcifications associated with columnar cell change. No evidence of atypia or malignancy. Margins are unremarkable.  [facility] Kitchen BREAST SURGERY Right November 06, 1999   multiple areas of microcalcification showing evidence of sclerosing adenosis and ductal hyperplasia.  Marland Kitchen BREAST SURGERY Left May 07, 2007   wide excision.  . cataract Right 2013  . CATARACT EXTRACTION W/PHACO Left 03/05/2016   Procedure: CATARACT EXTRACTION PHACO AND INTRAOCULAR LENS PLACEMENT (IOC);  Surgeon: Leandrew Koyanagi, MD;  Location: [facility];  Service: Ophthalmology;  Laterality: Left;  . COLONOSCOPY  2004  . COLONOSCOPY N/A 05/14/2015   Procedure: COLONOSCOPY;  Surgeon: Manya Silvas, MD;  Location[facility] ENDOSCOPY;  Service: Endoscopy;  Laterality: N/A;  . ERCP N/A   . ESOPHAGOGASTRODUODENOSCOPY N/A 05/14/2015   Procedure: ESOPHAGOGASTRODUODENOSCOPY (EGD);  Surgeon: Manya Silvas, MD;  Location: [facility] ENDOSCOPY;  Service: Endoscopy;  Laterality: N/A;  . EUS N/A 2011, 2012  . EYE SURGERY  2013  . GLAUCOMA SURGERY Right 2013  . LAPAROSCOPIC RIGHT COLECTOMY Right 06/22/2015   Procedure: LAPAROSCOPIC RIGHT COLECTOMY;  Surgeon: Robert Bellow, MD;  Location: [facility] ORS;  Service: General;  Laterality: Right;  .  NECK SURGERY  2006  . right breast cancer    . SAVORY DILATION N/A 05/14/2015   Procedure: SAVORY DILATION;  Surgeon: Manya Silvas, MD;  Location: [facility] ENDOSCOPY;  Service: Endoscopy;  Laterality: N/A;  . TRABECULECTOMY Left 03/05/2016   Procedure: TRABECULECTOMY WITH [facility] AND [facility] SHUNT;  Surgeon: Leandrew Koyanagi, MD;  Location: [facility];  Service: Ophthalmology;  Laterality: Left;  . TUBAL LIGATION      There were no vitals filed for this visit.  Subjective Assessment - 06/01/18 1538    Subjective  Pt reports no new significant changes; reports both good and bad balance days. 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 Overall feeling more steady  today and is noted to arrive without and AD.     Pertinent History  Pt is known to therapy clinic from prior episode. She underwent PT from 08/04/18-04/06/18 including aquatic and land based exercise for a total of 33 visits. She was not compliant during her last therapy episode with performing her home exercise program. At the end of her episode of care her outcome measures were worse than when she started. She recently saw Dr. Dossie Arbour for intraarticular hip injection and IT band injection. She feels like her balance has worsened since ending therapy. At end of last therapy episode pt was provided with an extensive list of community resources however she did not pursue any community based exercise program. She is moving to [employer] sometime in [place] in [place] where she will have access to a pool. Pt with extensive surgical and medical Hx including breast cancer (last surgery 2013), DJD with cervical fusion C3-5 in 2006, bipolar disorder, HTN, OA in bilateral shoulders L>R, hip and SIJ pain x >20 years. She states that she needs the accountability of therapy for her to continue her exercises and wants to start therapy again.      Enters/exits via ramp  Ambulation, blue dumbbells  Fwd 4 L  Side 2 L   Side with squat 2 L  High march 2 L    Step, LE strength and balance  Step ups, R/L, 1 UE support to no UE support 20x ea  Alternating toe taps, 2 x 1 min   LE strength  Squat with super noodle resist, 20x  STS without UE support, 20x  STS L, toe touch, R, 10x   Hip ext with super noodle resist, seated on bench, B 20x ea  Suspend work, 1 min each  Allayne Butcher  Ski                         PT Education - 06/01/18 1540    Education provided  Yes    Education Details  continued on balance in regards to posture and right self. Step work. Greater use of LLE with STS using toe touch on R.     Person(s) Educated  Patient    Methods   Explanation;Demonstration;Tactile cues;Verbal cues    Comprehension  Verbalized understanding;Returned demonstration;Verbal cues required;Tactile cues required;Need further instruction       PT Short Term Goals - 05/06/18 1124      PT SHORT TERM GOAL #1   Title  Pt will be independent with land and aquatic HEP in order to improve strength and balance in order to decrease fall risk and improve function at home and work.     Baseline  05/06/18: not performing consistently  Time  6    Period  Weeks    Status  New    Target Date  06/17/18      PT SHORT TERM GOAL #2   Title  Pt will appropriately use rollator in order to decrease fall risk and increase community access.     Baseline  Pt has a rollator but doesn't use consistently    Time  12    Period  Weeks    Status  New    Target Date  06/17/18        PT Long Term Goals - 05/06/18 1126      PT LONG TERM GOAL #1   Title  Patient will increase Functional Gait Assessment score to >20/30 as to reduce fall risk and improve dynamic gait safety with community ambulation.    Baseline  05/07/18: 9/30    Time  12    Period  Weeks    Status  New    Target Date  07/29/18      PT LONG TERM GOAL #2   Title  Patient will increase Berg Balance score by > 6 points to demonstrate decreased fall risk during functional activities.; Deferred as patient's balance is more impaired dynamically and therefore FGA goal more appropriate;     Baseline  05/06/18: 22/56    Time  12    Period  Weeks    Status  New    Target Date  07/29/18      PT LONG TERM GOAL #3   Title  Patient (> 70 years old) will complete five times sit to stand test in < 15 seconds indicating an increased LE strength and improved balance.    Baseline  05/06/18: unable to perform without bilateral UE support    Time  12    Period  Weeks    Status  New    Target Date  07/29/18      PT LONG TERM GOAL #4   Title  Patient will ascend/descend 4 stairs without rail assist  independently without loss of balance to improve ability to get in/out of home.     Baseline  05/07/18: able to negotiate reciprocally but requires rail assist    Time  12    Period  Weeks    Status  New    Target Date  07/29/18      PT LONG TERM GOAL #5   Title  --    Baseline  --    Time  --    Period  --    Status  --      PT LONG TERM GOAL #6   Title  --    Baseline  --    Time  --    Period  --    Status  --            Plan - 06/01/18 1542    Clinical Impression Statement  Pt requires CGA/Min A with most exercises/activities including seated activities for postural control and balance. Pt able to demonstrate increased work on LLE. Pt does require consistent cues to focus on posture/righting correction and techniques throughout exercises especially when pt talking, as concentration lessens. Pt did show improved ability to correct form/posture and righting ability with focus to task.     Rehab Potential  Poor    Clinical Impairments Affecting Rehab Potential  Pt does not use AD and had decreased knowledge of her limitations, poor insight, poor compliance with home program  PT Frequency  1x / week    PT Duration  12 weeks    PT Treatment/Interventions  ADLs/Self Care Home Management;Aquatic Therapy;Biofeedback;Canalith Repostioning;Cryotherapy;Moist Heat;Cognitive remediation;Neuromuscular re-education;Balance training;Therapeutic exercise;Therapeutic activities;Functional mobility training;Stair training;Gait training;DME Instruction;Patient/family education;Orthotic Fit/Training;Manual techniques;Passive range of motion;Visual/perceptual remediation/compensation;Vestibular;Energy conservation;Dry needling    PT Next Visit Plan  Provide extensive written HEP for land exercises, continue balance and strengthening     PT Home Exercise Plan  Extensive aquatic exercises provided (see pt information), will issue land based HEP at next session    Consulted and Agree with Plan of  Care  Patient       Patient will benefit from skilled therapeutic intervention in order to improve the following deficits and impairments:  Abnormal gait, Decreased balance, Decreased safety awareness, Decreased knowledge of use of DME, Decreased knowledge of precautions, Decreased endurance, Decreased strength, Difficulty walking, Postural dysfunction, Pain, Decreased cognition, Decreased coordination  Visit Diagnosis: Unsteadiness on feet  Repeated falls  Muscle weakness (generalized)  Other lack of coordination  Other abnormalities of gait and mobility     Problem List Patient Active Problem List   Diagnosis Date Noted  . Osteoarthritis of hip (Right) 04/29/2018  . Iliotibial band syndrome (Right) 04/29/2018  . Allodynia 04/14/2018  . Spondylosis without myelopathy or radiculopathy, lumbosacral region 03/11/2018  . Other specified dorsopathies, sacral and sacrococcygeal region 03/11/2018  . Ataxic gait (Associated w/ CSF Pressure) 11/20/2017  . Gait instability 11/20/2017  . 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 Cognitive decline 11/20/2017  . PBA (pseudobulbar affect) 11/20/2017  . Truncal ataxia 11/20/2017  . Lumbar facet syndrome (Bilateral) (L>R) 07/29/2017  . Chronic sacroiliac joint pain (Bilateral) (L>R) 07/29/2017  . Chronic hip pain (Bilateral) (L>R) 07/29/2017  . Chronic lower extremity pain (Secondary Area of Pain) (Bilateral) (L>R) 07/29/2017  . Impairment of balance 07/07/2017  . Menopausal and female climacteric states 06/15/2017  . Lumbar foraminal stenosis (T12-S1, multilevel) 06/10/2017  . Lumbar lateral recess stenosis (left L2-3, bilateral L3-4, & right L4-5) 06/10/2017  . Grade 1 Anterolisthesis of L4 over L5 and L5 over S1 06/10/2017  . Lumbar facet hypertrophy (multilevel) 06/10/2017  . Lumbar Ligamentum flavum hypertrophy (HCC) (multilevel) 06/10/2017  . Dropfoot (Left) (L5 radiculopathy) 06/10/2017  . Neurogenic pain 06/09/2017  . DDD  (degenerative disc disease), lumbar 06/09/2017  . 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 Closed fracture of metatarsal bone 05/06/2017  . 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 Abnormal MRI, cervical spine 05/06/2017  . 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[place]) 05/06/2017  . Cervical radiculitis 05/06/2017  . Chronic lumbar radiculopathy (Bilateral) (L>R) (left L5) 05/06/2017  . Chronic low back pain (Primary Area of Pain) (Bilateral) (L>R) 05/06/2017  . Abnormal MRI, lumbar spine 05/06/2017  . Chronic pain syndrome 03/19/2017  . Chronic shoulder pain (Fourth Area of Pain) (Bilateral) (L>R) 03/19/2017  . Osteoarthritis of shoulder (Bilateral) 03/19/2017  . Chronic neck pain [facility] Area of Pain) (Bilateral) (L>R) 03/19/2017  . History of C3-5 ACDF 03/19/2017  . Numbness and tingling in left upper extremity 03/19/2017  . Numbness and tingling of right upper extremity 03/19/2017  . Upper extremity weakness (Bilateral) (L>R) 03/19/2017  . Numbness of upper extremity (Bilateral) (L>R) 03/19/2017  . Numbness and tingling of lower extremity (Bilateral) (L>R) 03/19/2017  . Weakness of lower extremity (Bilateral) (R>L) 03/19/2017  . Chronic abdominal pain (epigastric) 03/19/2017  . Pituitary microadenoma ([place]) 12/08/2016  . History of breast cancer in  adulthood 12/08/2016  . Dysmetria 12/08/2016  . Loss of weight 08/15/2015  . Colon polyp 05/25/2015  . Personal history of malignant neoplasm of breast 07/18/2014  . Breast cancer ([place]) 06/28/2014  . Bipolar disorder ([place]) 06/28/2014  . Chronic pancreatitis ([place]) 06/28/2014  . HTN (hypertension) 06/28/2014  . OA (osteoarthritis) 06/28/2014  . Renal insufficiency 06/28/2014  . DDD (degenerative disc disease), cervical 06/28/2014  . Hyperlipidemia 06/28/2014  . COPD (chronic obstructive pulmonary disease) ([place]) 05/17/2014  . Breast  cancer of upper-outer quadrant of right female breast ([place]) 05/31/2008    Larae Grooms 06/01/2018, 3:45 PM  [place] MAIN [facility] SERVICES [address] [place], [place], [address] Phone: [phone]   Fax:  [phone]  Name: Elaine Clayton MRN: [id number] Date of Birth: [birth year]

## 2018-06-08 ENCOUNTER — Encounter: Payer: Self-pay | Admitting: Physical Therapy

## 2018-06-08 ENCOUNTER — Ambulatory Visit: Payer: Medicare Other | Admitting: Physical Therapy

## 2018-06-08 DIAGNOSIS — R296 Repeated falls: Secondary | ICD-10-CM

## 2018-06-08 DIAGNOSIS — R2681 Unsteadiness on feet: Secondary | ICD-10-CM | POA: Diagnosis not present

## 2018-06-08 DIAGNOSIS — R278 Other lack of coordination: Secondary | ICD-10-CM

## 2018-06-08 DIAGNOSIS — M6281 Muscle weakness (generalized): Secondary | ICD-10-CM

## 2018-06-08 NOTE — Therapy (Signed)
[place] [facility] SERVICES [address] [place], [place], [address] Phone: [phone]   Fax:  [phone]  Physical Therapy Treatment Physical Therapy Progress Note   Dates of reporting period  05/06/18   to   06/08/18   Patient Details  Name: Elaine Clayton MRN: [id number] Date of Birth: 1946/08/04 Referring Provider: Dr. Dossie Arbour   Encounter Date: 06/08/2018  PT End of Session - 06/08/18 1109    Visit Number  6    Number of Visits  13    Date for PT Re-Evaluation  07/29/18    PT Start Time  1055    PT Stop Time  1145    PT Time Calculation (min)  50 min    Activity Tolerance  Patient tolerated treatment well;No increased pain    Behavior During Therapy  WFL for tasks assessed/performed       Past Medical History:  Diagnosis Date  . Anemia   . Ataxia   . Bipolar affective disorder ([place])    2  . Broken foot 2015   right foot  . Chronic gastritis   . Chronic kidney disease    Dr Holley Raring  . COPD (chronic obstructive pulmonary disease) ([place])   . Dizziness    inner ear (per pt) several times per week  . Dysphagia   . Glaucoma   . Hyperlipemia   . Hypertension    pt has recently come off BP meds.  MD aware. BP seems stable.  . Lithium toxicity 2015  . 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 Neck stiffness    s/p C3-C4 fusion, limited right turn  . Osteoarthritis    back  . Pancreatitis   . Parkinson disease ([place])   . Personal history of malignant neoplasm of breast   . Pituitary microadenoma ([place])   . Recurrent UTI   . Renal insufficiency   . Stomach ulcer 2112    Past Surgical History:  Procedure Laterality Date  . APPENDECTOMY    . BREAST BIOPSY Right 2011   neg  . BREAST EXCISIONAL BIOPSY Right 2001   neg  . BREAST EXCISIONAL BIOPSY Right 2008   breast ca 2008 radiation  . BREAST EXCISIONAL BIOPSY Left 10/26/2012   neg  . BREAST  SURGERY Left  2013   left breast wide excision,Intraductal papilloma, ductal hyperplasia and sclerosing adenosis. Microcalcifications associated with columnar cell change. No evidence of atypia or malignancy. Margins are unremarkable.  [facility] Kitchen BREAST SURGERY Right November 06, 1999   multiple areas of microcalcification showing evidence of sclerosing adenosis and ductal hyperplasia.  [facility] Kitchen BREAST SURGERY Left May 07, 2007   wide excision.  . cataract Right 2013  . CATARACT EXTRACTION W/PHACO Left 03/05/2016   Procedure: CATARACT EXTRACTION PHACO AND INTRAOCULAR LENS PLACEMENT (IOC);  Surgeon: Leandrew Koyanagi, MD;  Location: [place];  Service: Ophthalmology;  Laterality: Left;  . COLONOSCOPY  2004  . COLONOSCOPY N/A 05/14/2015   Procedure: COLONOSCOPY;  Surgeon: Manya Silvas, MD;  Location[facility] ENDOSCOPY;  Service: Endoscopy;  Laterality: N/A;  . ERCP N/A   . ESOPHAGOGASTRODUODENOSCOPY N/A 05/14/2015   Procedure: ESOPHAGOGASTRODUODENOSCOPY (EGD);  Surgeon: Manya Silvas, MD;  Location: [facility] ENDOSCOPY;  Service: Endoscopy;  Laterality: N/A;  . EUS N/A 2011, 2012  . EYE SURGERY  2013  . GLAUCOMA SURGERY Right 2013  . LAPAROSCOPIC RIGHT COLECTOMY Right 06/22/2015   Procedure: LAPAROSCOPIC  RIGHT COLECTOMY;  Surgeon: Robert Bellow, MD;  Location: [facility] ORS;  Service: General;  Laterality: Right;  . NECK SURGERY  2006  . right breast cancer    . SAVORY DILATION N/A 05/14/2015   Procedure: SAVORY DILATION;  Surgeon: Manya Silvas, MD;  Location: [facility] ENDOSCOPY;  Service: Endoscopy;  Laterality: N/A;  . TRABECULECTOMY Left 03/05/2016   Procedure: TRABECULECTOMY WITH [facility] AND EXPRESS SHUNT;  Surgeon: Leandrew Koyanagi, MD;  Location: [place];  Service: Ophthalmology;  Laterality: Left;  . TUBAL LIGATION      There were no vitals filed for this visit.  Subjective Assessment - 06/08/18 1107    Subjective  Patient reports continued left sided low back and LLE pain at 8/10  in the morning; She states, "When I take a vicoden then it goes down to a 5/10"     Pertinent History  Pt is known to therapy clinic from prior episode. She underwent PT from 08/04/18-04/06/18 including aquatic and land based exercise for a total of 33 visits. She was not compliant during her last therapy episode with performing her home exercise program. At the end of her episode of care her outcome measures were worse than when she started. She recently saw Dr. Dossie Arbour for intraarticular hip injection and IT band injection. She feels like her balance has worsened since ending therapy. At end of last therapy episode pt was provided with an extensive list of community resources however she did not pursue any community based exercise program. She is moving to [employer] sometime in [place] in [place] where she will have access to a pool. Pt with extensive surgical and medical Hx including breast cancer (last surgery 2013), DJD with cervical fusion C3-5 in 2006, bipolar disorder, HTN, OA in bilateral shoulders L>R, hip and SIJ pain x >20 years. She states that she needs the accountability of therapy for her to continue her exercises and wants to start therapy again.     Currently in Pain?  Yes    Pain Score  5     Pain Location  Back    Pain Orientation  Left;Lower    Pain Descriptors / Indicators  Discomfort;Constant    Pain Type  Chronic pain    Pain Onset  More than a month ago    Pain Frequency  Constant    Aggravating Factors   worse with cold weather, prolonged sitting/walking    Pain Relieving Factors  pain meds/procedures    Effect of Pain on Daily Activities  decreased activity tolerance;          [facility] PT Assessment - 06/08/18 0001      Standardized Balance Assessment   10 Meter Walk  0.71 m/s without AD, limited home/community ambulator, incresaed risk for falls; improved from 0.64 m/s on 05/07/18      Berg Balance Test   Sit to Stand  Able to stand   independently using hands    Standing Unsupported  Able to stand 2 minutes with supervision    Sitting with Back Unsupported but Feet Supported on Floor or Stool  Able to sit safely and securely 2 minutes    Stand to Sit  Controls descent by using hands    Transfers  Able to transfer safely, definite need of hands    Standing Unsupported with Eyes Closed  Able to stand 10 seconds with supervision    Standing Ubsupported with Feet Together  Able to place feet together independently and  stand for 1 minute with supervision    From Standing, Reach Forward with Outstretched Arm  Can reach forward >12 cm safely (5")    From Standing Position, Pick up Object from [place] to pick up shoe, needs supervision    From Standing Position, Turn to Look Behind Over each Shoulder  Needs supervision when turning    Turn 360 Degrees  Able to turn 360 degrees safely but slowly    Standing Unsupported, Alternately Place Feet on Step/Stool  Able to complete >2 steps/needs minimal assist    Standing Unsupported, One Foot in [place] help to step but can hold 15 seconds    Standing on One Leg  Able to lift leg independently and hold equal to or more than 3 seconds    Total Score  35    Berg comment:  100% risk for falls, improved from 24/56 on 05/07/18         TREATMENT: Warm up on [place], level 2 BUE/BLE x4 min with history intake;  Patient instructed in [place], 10 meter walk to address goals, see above;  Patient does exhibit improved balance but is still considered a high fall risk;  Gait in gym:  30 feet on level surface with lateral head turns x8 sets with CGA to close supervision; Initially patient exhibits shuffled steps with slower gait speed. She required cues to increase step length. When challenged to call out what she was looking at she had significant difficulty with gait due to difficulty with dual task. However when asked to just turn head without cognitive task, she was  able to exhibit better step length;   Patient educated in progress towards goals;                    PT Education - 06/08/18 1109    Education provided  Yes    Education Details  balance, postural strengthening, progress towards goals;     Person(s) Educated  Patient    Methods  Explanation;Demonstration;Verbal cues    Comprehension  Verbalized understanding;Returned demonstration;Verbal cues required;Need further instruction       PT Short Term Goals - 06/08/18 1110      PT SHORT TERM GOAL #1   Title  Pt will be independent with land and aquatic HEP in order to improve strength and balance in order to decrease fall risk and improve function at home and work.     Baseline  05/06/18: not performing consistently    Time  6    Period  Weeks    Status  On-going    Target Date  06/17/18      PT SHORT TERM GOAL #2   Title  Pt will appropriately use rollator in order to decrease fall risk and increase community access.     Baseline  uses the rollator when walking out in community; but mostly uses SPC; will walk holding cane, not using cane;     Time  12    Period  Weeks    Status  Not Met    Target Date  06/17/18        PT Long Term Goals - 06/08/18 1111      PT LONG TERM GOAL #1   Title  Patient will increase Functional Gait Assessment score to >20/30 as to reduce fall risk and improve dynamic gait safety with community ambulation.    Baseline  05/07/18: 9/30    Time  12  Period  Weeks    Status  On-going    Target Date  07/29/18      PT LONG TERM GOAL #2   Title  Patient will increase Berg Balance score by > 6 points to demonstrate decreased fall risk during functional activities.; Deferred as patient's balance is more impaired dynamically and therefore FGA goal more appropriate;     Baseline  05/06/18: 22/56; 06/08/18: 35/56    Time  12    Period  Weeks    Status  Achieved    Target Date  07/29/18      PT LONG TERM GOAL #3   Title  Patient (> 16 years  old) will complete five times sit to stand test in < 15 seconds indicating an increased LE strength and improved balance.    Baseline  05/06/18: unable to perform without bilateral UE support    Time  12    Period  Weeks    Status  On-going    Target Date  07/29/18      PT LONG TERM GOAL #4   Title  Patient will ascend/descend 4 stairs without rail assist independently without loss of balance to improve ability to get in/out of home.     Baseline  05/07/18: able to negotiate reciprocally but requires rail assist; one step at a time descending with B rail assist;     Time  12    Period  Weeks    Status  Not Met    Target Date  07/29/18            Plan - 06/08/18 1356    Clinical Impression Statement  Patient exhibits improved balance with berg balance assessment. She is still testing as a high fall risk. Patient instructed in dynamic balance activities with instruction to improve reciprocal gait pattern with head turns. She has significant difficulty doing dual task activities exhibiting increased shuffle and slower gait speed with head turns. Reinforced HEP; would continue to benefit from skilled PT intervention to improve strength and mobility;  Patient's condition has the potential to improve in response to therapy. Maximum improvement is yet to be obtained. The anticipated improvement is attainable and reasonable in a generally predictable time. Start date of reporting period 05/06/18 end date of reporting period 06/08/18. Patient reports doing some better but states that her balance is inconsistent.     Rehab Potential  Poor    Clinical Impairments Affecting Rehab Potential  Pt does not use AD and had decreased knowledge of her limitations, poor insight, poor compliance with home program    PT Frequency  1x / week    PT Duration  12 weeks    PT Treatment/Interventions  ADLs/Self Care Home Management;Aquatic Therapy;Biofeedback;Canalith Repostioning;Cryotherapy;Moist Heat;Cognitive  remediation;Neuromuscular re-education;Balance training;Therapeutic exercise;Therapeutic activities;Functional mobility training;Stair training;Gait training;DME Instruction;Patient/family education;Orthotic Fit/Training;Manual techniques;Passive range of motion;Visual/perceptual remediation/compensation;Vestibular;Energy conservation;Dry needling    PT Next Visit Plan  Provide extensive written HEP for land exercises, continue balance and strengthening     PT Home Exercise Plan  Extensive aquatic exercises provided (see pt information), will issue land based HEP at next session    Consulted and Agree with Plan of Care  Patient       Patient will benefit from skilled therapeutic intervention in order to improve the following deficits and impairments:  Abnormal gait, Decreased balance, Decreased safety awareness, Decreased knowledge of use of DME, Decreased knowledge of precautions, Decreased endurance, Decreased strength, Difficulty walking, Postural dysfunction, Pain, Decreased cognition, Decreased coordination  Visit Diagnosis: Unsteadiness on feet  Repeated falls  Muscle weakness (generalized)  Other lack of coordination     Problem List Patient Active Problem List   Diagnosis Date Noted  . Osteoarthritis of hip (Right) 04/29/2018  . Iliotibial band syndrome (Right) 04/29/2018  . Allodynia 04/14/2018  . Spondylosis without myelopathy or radiculopathy, lumbosacral region 03/11/2018  . Other specified dorsopathies, sacral and sacrococcygeal region 03/11/2018  . Ataxic gait (Associated w/ CSF Pressure) 11/20/2017  . Gait instability 11/20/2017  . 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 Cognitive decline 11/20/2017  . PBA (pseudobulbar affect) 11/20/2017  . Truncal ataxia 11/20/2017  . Lumbar facet syndrome (Bilateral) (L>R) 07/29/2017  . Chronic sacroiliac joint pain (Bilateral) (L>R) 07/29/2017  . Chronic hip pain (Bilateral) (L>R) 07/29/2017  . Chronic lower extremity pain  (Secondary Area of Pain) (Bilateral) (L>R) 07/29/2017  . Impairment of balance 07/07/2017  . Menopausal and female climacteric states 06/15/2017  . Lumbar foraminal stenosis (T12-S1, multilevel) 06/10/2017  . Lumbar lateral recess stenosis (left L2-3, bilateral L3-4, & right L4-5) 06/10/2017  . Grade 1 Anterolisthesis of L4 over L5 and L5 over S1 06/10/2017  . Lumbar facet hypertrophy (multilevel) 06/10/2017  . Lumbar Ligamentum flavum hypertrophy (HCC) (multilevel) 06/10/2017  . Dropfoot (Left) (L5 radiculopathy) 06/10/2017  . Neurogenic pain 06/09/2017  . DDD (degenerative disc disease), lumbar 06/09/2017  . 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 Closed fracture of metatarsal bone 05/06/2017  . 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 Abnormal MRI, cervical spine 05/06/2017  . 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[place]) 05/06/2017  . Cervical radiculitis 05/06/2017  . Chronic lumbar radiculopathy (Bilateral) (L>R) (left L5) 05/06/2017  . Chronic low back pain (Primary Area of Pain) (Bilateral) (L>R) 05/06/2017  . Abnormal MRI, lumbar spine 05/06/2017  . Chronic pain syndrome 03/19/2017  . Chronic shoulder pain (Fourth Area of Pain) (Bilateral) (L>R) 03/19/2017  . Osteoarthritis of shoulder (Bilateral) 03/19/2017  . Chronic neck pain [facility] Area of Pain) (Bilateral) (L>R) 03/19/2017  . History of C3-5 ACDF 03/19/2017  . Numbness and tingling in left upper extremity 03/19/2017  . Numbness and tingling of right upper extremity 03/19/2017  . Upper extremity weakness (Bilateral) (L>R) 03/19/2017  . Numbness of upper extremity (Bilateral) (L>R) 03/19/2017  . Numbness and tingling of lower extremity (Bilateral) (L>R) 03/19/2017  . Weakness of lower extremity (Bilateral) (R>L) 03/19/2017  . Chronic abdominal pain (epigastric) 03/19/2017  . Pituitary microadenoma ([place]) 12/08/2016  .  History of breast cancer in adulthood 12/08/2016  . Dysmetria 12/08/2016  . Loss of weight 08/15/2015  . Colon polyp 05/25/2015  . Personal history of malignant neoplasm of breast 07/18/2014  . Breast cancer ([place]) 06/28/2014  . Bipolar disorder ([place]) 06/28/2014  . Chronic pancreatitis ([place]) 06/28/2014  . HTN (hypertension) 06/28/2014  . OA (osteoarthritis) 06/28/2014  . Renal insufficiency 06/28/2014  . DDD (degenerative disc disease), cervical 06/28/2014  . Hyperlipidemia 06/28/2014  . COPD (chronic obstructive pulmonary disease) ([place]) 05/17/2014  . Breast cancer of upper-outer quadrant of right female breast ([place]) 05/31/2008    Trotter,Margaret DPT 06/08/2018, 1:58 PM  [place] [facility] SERVICES [address] [place], [place], [address] Phone: [phone]   Fax:  [phone]  Name: Elaine Clayton MRN: [id number] Date of Birth: May 03, 1946

## 2018-06-14 ENCOUNTER — Other Ambulatory Visit: Payer: Self-pay | Admitting: Internal Medicine

## 2018-06-14 DIAGNOSIS — R270 Ataxia, unspecified: Secondary | ICD-10-CM

## 2018-06-15 ENCOUNTER — Ambulatory Visit: Payer: Medicare Other

## 2018-06-15 ENCOUNTER — Other Ambulatory Visit: Payer: Self-pay

## 2018-06-15 DIAGNOSIS — R2681 Unsteadiness on feet: Secondary | ICD-10-CM

## 2018-06-15 DIAGNOSIS — M6281 Muscle weakness (generalized): Secondary | ICD-10-CM

## 2018-06-15 DIAGNOSIS — R278 Other lack of coordination: Secondary | ICD-10-CM

## 2018-06-15 DIAGNOSIS — R296 Repeated falls: Secondary | ICD-10-CM

## 2018-06-15 DIAGNOSIS — R2689 Other abnormalities of gait and mobility: Secondary | ICD-10-CM

## 2018-06-15 NOTE — Therapy (Signed)
[place] MAIN [facility] SERVICES [address] [place], [place], [address] Phone: [phone]   Fax:  [phone]  Physical Therapy Treatment  Patient Details  Name: Elaine Clayton MRN: [id number] Date of Birth: 04-27-1946 Referring Provider: Dr. Dossie Arbour   Encounter Date: 06/15/2018  PT End of Session - 06/15/18 1544    Visit Number  7    Number of Visits  13    Date for PT Re-Evaluation  07/29/18    PT Start Time  1205    PT Stop Time  1324    PT Time Calculation (min)  60 min    Activity Tolerance  Patient tolerated treatment well;No increased pain    Behavior During Therapy  WFL for tasks assessed/performed       Past Medical History:  Diagnosis Date  . Anemia   . Ataxia   . Bipolar affective disorder ([place])    2  . Broken foot 2015   right foot  . Chronic gastritis   . Chronic kidney disease    Dr Holley Raring  . COPD (chronic obstructive pulmonary disease) ([place])   . Dizziness    inner ear (per pt) several times per week  . Dysphagia   . Glaucoma   . Hyperlipemia   . Hypertension    pt has recently come off BP meds.  MD aware. BP seems stable.  . Lithium toxicity 2015  . 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 Neck stiffness    s/p C3-C4 fusion, limited right turn  . Osteoarthritis    back  . Pancreatitis   . Parkinson disease ([place])   . Personal history of malignant neoplasm of breast   . Pituitary microadenoma ([place])   . Recurrent UTI   . Renal insufficiency   . Stomach ulcer 2112    Past Surgical History:  Procedure Laterality Date  . APPENDECTOMY    . BREAST BIOPSY Right 2011   neg  . BREAST EXCISIONAL BIOPSY Right 2001   neg  . BREAST EXCISIONAL BIOPSY Right 2008   breast ca 2008 radiation  . BREAST EXCISIONAL BIOPSY Left 10/26/2012   neg  . BREAST SURGERY Left  2013   left breast wide excision,Intraductal papilloma, ductal hyperplasia  and sclerosing adenosis. Microcalcifications associated with columnar cell change. No evidence of atypia or malignancy. Margins are unremarkable.  Marland Kitchen BREAST SURGERY Right November 06, 1999   multiple areas of microcalcification showing evidence of sclerosing adenosis and ductal hyperplasia.  Marland Kitchen BREAST SURGERY Left May 07, 2007   wide excision.  . cataract Right 2013  . CATARACT EXTRACTION W/PHACO Left 03/05/2016   Procedure: CATARACT EXTRACTION PHACO AND INTRAOCULAR LENS PLACEMENT (IOC);  Surgeon: Leandrew Koyanagi, MD;  Location: [place];  Service: Ophthalmology;  Laterality: Left;  . COLONOSCOPY  2004  . COLONOSCOPY N/A 05/14/2015   Procedure: COLONOSCOPY;  Surgeon: Manya Silvas, MD;  Location[facility] ENDOSCOPY;  Service: Endoscopy;  Laterality: N/A;  . ERCP N/A   . ESOPHAGOGASTRODUODENOSCOPY N/A 05/14/2015   Procedure: ESOPHAGOGASTRODUODENOSCOPY (EGD);  Surgeon: Manya Silvas, MD;  Location: [facility] ENDOSCOPY;  Service: Endoscopy;  Laterality: N/A;  . EUS N/A 2011, 2012  . EYE SURGERY  2013  . GLAUCOMA SURGERY Right 2013  . LAPAROSCOPIC RIGHT COLECTOMY Right 06/22/2015   Procedure: LAPAROSCOPIC RIGHT COLECTOMY;  Surgeon: Robert Bellow, MD;  Location: [facility] ORS;  Service: General;  Laterality: Right;  .  NECK SURGERY  2006  . right breast cancer    . SAVORY DILATION N/A 05/14/2015   Procedure: SAVORY DILATION;  Surgeon: Manya Silvas, MD;  Location: [facility] ENDOSCOPY;  Service: Endoscopy;  Laterality: N/A;  . TRABECULECTOMY Left 03/05/2016   Procedure: TRABECULECTOMY WITH [facility] AND EXPRESS SHUNT;  Surgeon: Leandrew Koyanagi, MD;  Location: [place];  Service: Ophthalmology;  Laterality: Left;  . TUBAL LIGATION      There were no vitals filed for this visit.  Subjective Assessment - 06/15/18 1538    Subjective  Pt reports continued difficulties with balance especially when bending/stooping down close to floor for tasks at home (ie getting things out of low kitchen  cupboards and cleaning litter box, cleaning up spills)    Patient is accompained by:  Family member    Pertinent History  Pt is known to therapy clinic from prior episode. She underwent PT from 08/04/18-04/06/18 including aquatic and land based exercise for a total of 33 visits. She was not compliant during her last therapy episode with performing her home exercise program. At the end of her episode of care her outcome measures were worse than when she started. She recently saw Dr. Dossie Arbour for intraarticular hip injection and IT band injection. She feels like her balance has worsened since ending therapy. At end of last therapy episode pt was provided with an extensive list of community resources however she did not pursue any community based exercise program. She is moving to [employer] sometime in [place] in [place] where she will have access to a pool. Pt with extensive surgical and medical Hx including breast cancer (last surgery 2013), DJD with cervical fusion C3-5 in 2006, bipolar disorder, HTN, OA in bilateral shoulders L>R, hip and SIJ pain x >20 years. She states that she needs the accountability of therapy for her to continue her exercises and wants to start therapy again.       Enters/exits via ramp  Balance, walking, no UE support  4 L fwd  4 L side  4 L side with squat  4 L high march  1 L bkwd (unable to continue; pt froze-unable to step bkwd)  STS from bench, no UE, 20x  Static ball toss, 5 min   Sidestep with ball toss, 4 L  Resisted squat with super noodle, 20x  Stepping side lunge, B, 20x ea  Active quad/hip flexor and hamstrings stretching, B 3x each                         PT Education - 06/15/18 1542    Education provided  Yes    Education Details  On modification of household tasks using a mid height (not 1 foot high) stool to sit for tasks normally pt would stoop for.    Person(s) Educated  Patient;Spouse     Methods  Explanation    Comprehension  Verbalized understanding       PT Short Term Goals - 06/08/18 1110      PT SHORT TERM GOAL #1   Title  Pt will be independent with land and aquatic HEP in order to improve strength and balance in order to decrease fall risk and improve function at home and work.     Baseline  05/06/18: not performing consistently    Time  6    Period  Weeks    Status  On-going    Target Date  06/17/18  PT SHORT TERM GOAL #2   Title  Pt will appropriately use rollator in order to decrease fall risk and increase community access.     Baseline  uses the rollator when walking out in community; but mostly uses SPC; will walk holding cane, not using cane;     Time  12    Period  Weeks    Status  Not Met    Target Date  06/17/18        PT Long Term Goals - 06/08/18 1111      PT LONG TERM GOAL #1   Title  Patient will increase Functional Gait Assessment score to >20/30 as to reduce fall risk and improve dynamic gait safety with community ambulation.    Baseline  05/07/18: 9/30    Time  12    Period  Weeks    Status  On-going    Target Date  07/29/18      PT LONG TERM GOAL #2   Title  Patient will increase Berg Balance score by > 6 points to demonstrate decreased fall risk during functional activities.; Deferred as patient's balance is more impaired dynamically and therefore FGA goal more appropriate;     Baseline  05/06/18: 22/56; 06/08/18: 35/56    Time  12    Period  Weeks    Status  Achieved    Target Date  07/29/18      PT LONG TERM GOAL #3   Title  Patient (> 63 years old) will complete five times sit to stand test in < 15 seconds indicating an increased LE strength and improved balance.    Baseline  05/06/18: unable to perform without bilateral UE support    Time  12    Period  Weeks    Status  On-going    Target Date  07/29/18      PT LONG TERM GOAL #4   Title  Patient will ascend/descend 4 stairs without rail assist independently without loss  of balance to improve ability to get in/out of home.     Baseline  05/07/18: able to negotiate reciprocally but requires rail assist; one step at a time descending with B rail assist;     Time  12    Period  Weeks    Status  Not Met    Target Date  07/29/18            Plan - 06/15/18 1544    Clinical Impression Statement  Pt agreeable to modification of household tasks with using stool. Discussed separately with spouse who suggests taking over some of the tasks for pt himself. Educated/discussed further the detriment of taking away so much purpose and independence (pt recently no longer driving for safety) Spouse agreeable. Pt performed quite well in session today with heavy focus on balance. Continue as such for balance and LE strength progression.     Rehab Potential  Poor    Clinical Impairments Affecting Rehab Potential  Pt does not use AD and had decreased knowledge of her limitations, poor insight, poor compliance with home program    PT Frequency  1x / week    PT Duration  12 weeks    PT Treatment/Interventions  ADLs/Self Care Home Management;Aquatic Therapy;Biofeedback;Canalith Repostioning;Cryotherapy;Moist Heat;Cognitive remediation;Neuromuscular re-education;Balance training;Therapeutic exercise;Therapeutic activities;Functional mobility training;Stair training;Gait training;DME Instruction;Patient/family education;Orthotic Fit/Training;Manual techniques;Passive range of motion;Visual/perceptual remediation/compensation;Vestibular;Energy conservation;Dry needling    PT Next Visit Plan  Provide extensive written HEP for land exercises, continue balance and strengthening  PT Home Exercise Plan  Extensive aquatic exercises provided (see pt information), will issue land based HEP at next session    Consulted and Agree with Plan of Care  Patient       Patient will benefit from skilled therapeutic intervention in order to improve the following deficits and impairments:  Abnormal  gait, Decreased balance, Decreased safety awareness, Decreased knowledge of use of DME, Decreased knowledge of precautions, Decreased endurance, Decreased strength, Difficulty walking, Postural dysfunction, Pain, Decreased cognition, Decreased coordination  Visit Diagnosis: Unsteadiness on feet  Repeated falls  Muscle weakness (generalized)  Other lack of coordination  Other abnormalities of gait and mobility     Problem List Patient Active Problem List   Diagnosis Date Noted  . Osteoarthritis of hip (Right) 04/29/2018  . Iliotibial band syndrome (Right) 04/29/2018  . Allodynia 04/14/2018  . Spondylosis without myelopathy or radiculopathy, lumbosacral region 03/11/2018  . Other specified dorsopathies, sacral and sacrococcygeal region 03/11/2018  . Ataxic gait (Associated w/ CSF Pressure) 11/20/2017  . Gait instability 11/20/2017  . 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 Cognitive decline 11/20/2017  . PBA (pseudobulbar affect) 11/20/2017  . Truncal ataxia 11/20/2017  . Lumbar facet syndrome (Bilateral) (L>R) 07/29/2017  . Chronic sacroiliac joint pain (Bilateral) (L>R) 07/29/2017  . Chronic hip pain (Bilateral) (L>R) 07/29/2017  . Chronic lower extremity pain (Secondary Area of Pain) (Bilateral) (L>R) 07/29/2017  . Impairment of balance 07/07/2017  . Menopausal and female climacteric states 06/15/2017  . Lumbar foraminal stenosis (T12-S1, multilevel) 06/10/2017  . Lumbar lateral recess stenosis (left L2-3, bilateral L3-4, & right L4-5) 06/10/2017  . Grade 1 Anterolisthesis of L4 over L5 and L5 over S1 06/10/2017  . Lumbar facet hypertrophy (multilevel) 06/10/2017  . Lumbar Ligamentum flavum hypertrophy (HCC) (multilevel) 06/10/2017  . Dropfoot (Left) (L5 radiculopathy) 06/10/2017  . Neurogenic pain 06/09/2017  . DDD (degenerative disc disease), lumbar 06/09/2017  . 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 Closed fracture of metatarsal bone  05/06/2017  . 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 Abnormal MRI, cervical spine 05/06/2017  . 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[place]) 05/06/2017  . Cervical radiculitis 05/06/2017  . Chronic lumbar radiculopathy (Bilateral) (L>R) (left L5) 05/06/2017  . Chronic low back pain (Primary Area of Pain) (Bilateral) (L>R) 05/06/2017  . Abnormal MRI, lumbar spine 05/06/2017  . Chronic pain syndrome 03/19/2017  . Chronic shoulder pain (Fourth Area of Pain) (Bilateral) (L>R) 03/19/2017  . Osteoarthritis of shoulder (Bilateral) 03/19/2017  . Chronic neck pain [facility] Area of Pain) (Bilateral) (L>R) 03/19/2017  . History of C3-5 ACDF 03/19/2017  . Numbness and tingling in left upper extremity 03/19/2017  . Numbness and tingling of right upper extremity 03/19/2017  . Upper extremity weakness (Bilateral) (L>R) 03/19/2017  . Numbness of upper extremity (Bilateral) (L>R) 03/19/2017  . Numbness and tingling of lower extremity (Bilateral) (L>R) 03/19/2017  . Weakness of lower extremity (Bilateral) (R>L) 03/19/2017  . Chronic abdominal pain (epigastric) 03/19/2017  . Pituitary microadenoma ([place]) 12/08/2016  . History of breast cancer in adulthood 12/08/2016  . Dysmetria 12/08/2016  . Loss of weight 08/15/2015  . Colon polyp 05/25/2015  . Personal history of malignant neoplasm of breast 07/18/2014  . Breast cancer ([place]) 06/28/2014  . Bipolar disorder ([place]) 06/28/2014  . Chronic pancreatitis ([place]) 06/28/2014  . HTN (hypertension) 06/28/2014  . OA (osteoarthritis) 06/28/2014  . Renal insufficiency 06/28/2014  . DDD (degenerative disc disease), cervical 06/28/2014  .  Hyperlipidemia 06/28/2014  . COPD (chronic obstructive pulmonary disease) ([place]) 05/17/2014  . Breast cancer of upper-outer quadrant of right female breast ([place]) 05/31/2008    Elaine Clayton 06/15/2018, 3:48 PM  [place] MAIN [facility] SERVICES [address] [place], [place], [address] Phone: [phone]   Fax:  [phone]  Name: Elaine Clayton MRN: [id number] Date of Birth: 12-26-1945

## 2018-06-22 ENCOUNTER — Ambulatory Visit: Payer: Medicare Other | Attending: Neurology | Admitting: Physical Therapy

## 2018-06-22 ENCOUNTER — Encounter: Payer: Self-pay | Admitting: Physical Therapy

## 2018-06-22 ENCOUNTER — Ambulatory Visit
Admission: RE | Admit: 2018-06-22 | Discharge: 2018-06-22 | Disposition: A | Discharge status: Home / Self Care | Payer: Medicare Other | Source: Ambulatory Visit | Attending: Internal Medicine | Admitting: Internal Medicine

## 2018-06-22 DIAGNOSIS — R2681 Unsteadiness on feet: Secondary | ICD-10-CM | POA: Insufficient documentation

## 2018-06-22 DIAGNOSIS — R930 Abnormal findings on diagnostic imaging of skull and head, not elsewhere classified: Secondary | ICD-10-CM | POA: Insufficient documentation

## 2018-06-22 DIAGNOSIS — R296 Repeated falls: Secondary | ICD-10-CM | POA: Diagnosis present

## 2018-06-22 DIAGNOSIS — R270 Ataxia, unspecified: Secondary | ICD-10-CM | POA: Diagnosis present

## 2018-06-22 DIAGNOSIS — M6281 Muscle weakness (generalized): Secondary | ICD-10-CM | POA: Diagnosis present

## 2018-06-22 DIAGNOSIS — R278 Other lack of coordination: Secondary | ICD-10-CM | POA: Diagnosis present

## 2018-06-22 DIAGNOSIS — R2689 Other abnormalities of gait and mobility: Secondary | ICD-10-CM | POA: Insufficient documentation

## 2018-06-22 NOTE — Therapy (Signed)
[place] MAIN [facility] SERVICES [address] [place], [place], [address] Phone: [phone]   Fax:  [phone]  Physical Therapy Treatment  Patient Details  Name: Elaine Clayton MRN: [id number] Date of Birth: 1946-11-03 Referring Provider: Dr. Dossie Arbour   Encounter Date: 06/22/2018  PT End of Session - 06/22/18 1140    Visit Number  8    Number of Visits  13    Date for PT Re-Evaluation  07/29/18    PT Start Time  1132    PT Stop Time  1215    PT Time Calculation (min)  43 min    Activity Tolerance  Patient tolerated treatment well;No increased pain    Behavior During Therapy  WFL for tasks assessed/performed       Past Medical History:  Diagnosis Date  . Anemia   . Ataxia   . Bipolar affective disorder ([place])    2  . Broken foot 2015   right foot  . Chronic gastritis   . Chronic kidney disease    Dr Holley Raring  . COPD (chronic obstructive pulmonary disease) ([place])   . Dizziness    inner ear (per pt) several times per week  . Dysphagia   . Glaucoma   . Hyperlipemia   . Hypertension    pt has recently come off BP meds.  MD aware. BP seems stable.  . Lithium toxicity 2015  . 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 Neck stiffness    s/p C3-C4 fusion, limited right turn  . Osteoarthritis    back  . Pancreatitis   . Parkinson disease ([place])   . Personal history of malignant neoplasm of breast   . Pituitary microadenoma ([place])   . Recurrent UTI   . Renal insufficiency   . Stomach ulcer 2112    Past Surgical History:  Procedure Laterality Date  . APPENDECTOMY    . BREAST BIOPSY Right 2011   neg  . BREAST EXCISIONAL BIOPSY Right 2001   neg  . BREAST EXCISIONAL BIOPSY Right 2008   breast ca 2008 radiation  . BREAST EXCISIONAL BIOPSY Left 10/26/2012   neg  . BREAST SURGERY Left  2013   left breast wide excision,Intraductal papilloma, ductal hyperplasia  and sclerosing adenosis. Microcalcifications associated with columnar cell change. No evidence of atypia or malignancy. Margins are unremarkable.  Marland Kitchen BREAST SURGERY Right November 06, 1999   multiple areas of microcalcification showing evidence of sclerosing adenosis and ductal hyperplasia.  Marland Kitchen BREAST SURGERY Left May 07, 2007   wide excision.  . cataract Right 2013  . CATARACT EXTRACTION W/PHACO Left 03/05/2016   Procedure: CATARACT EXTRACTION PHACO AND INTRAOCULAR LENS PLACEMENT (IOC);  Surgeon: Leandrew Koyanagi, MD;  Location: [place];  Service: Ophthalmology;  Laterality: Left;  . COLONOSCOPY  2004  . COLONOSCOPY N/A 05/14/2015   Procedure: COLONOSCOPY;  Surgeon: Manya Silvas, MD;  Location: [facility] ENDOSCOPY;  Service: Endoscopy;  Laterality: N/A;  . ERCP N/A   . ESOPHAGOGASTRODUODENOSCOPY N/A 05/14/2015   Procedure: ESOPHAGOGASTRODUODENOSCOPY (EGD);  Surgeon: Manya Silvas, MD;  Location[facility]COPY;  Service: Endoscopy;  Laterality: N/A;  . EUS N/A 2011, 2012  . EYE SURGERY  2013  . GLAUCOMA SURGERY Right 2013  . LAPAROSCOPIC RIGHT COLECTOMY Right 06/22/2015   Procedure: LAPAROSCOPIC RIGHT COLECTOMY;  Surgeon: Robert Bellow, MD;  Location: [facility] ORS;  Service: General;  Laterality: Right;  .  NECK SURGERY  2006  . right breast cancer    . SAVORY DILATION N/A 05/14/2015   Procedure: SAVORY DILATION;  Surgeon: Manya Silvas, MD;  Location: [facility] ENDOSCOPY;  Service: Endoscopy;  Laterality: N/A;  . TRABECULECTOMY Left 03/05/2016   Procedure: TRABECULECTOMY WITH [facility] AND [facility];  Surgeon: Leandrew Koyanagi, MD;  Location: [place];  Service: Ophthalmology;  Laterality: Left;  . TUBAL LIGATION      There were no vitals filed for this visit.  Subjective Assessment - 06/22/18 1139    Subjective  Patient reports getting MRI this morning. She states that she is more tired today after getting up early.     Patient is accompained by:  Family member     Pertinent History  Pt is known to therapy clinic from prior episode. She underwent PT from 08/04/18-04/06/18 including aquatic and land based exercise for a total of 33 visits. She was not compliant during her last therapy episode with performing her home exercise program. At the end of her episode of care her outcome measures were worse than when she started. She recently saw Dr. Dossie Arbour for intraarticular hip injection and IT band injection. She feels like her balance has worsened since ending therapy. At end of last therapy episode pt was provided with an extensive list of community resources however she did not pursue any community based exercise program. She is moving to [employer] sometime in [place] in [place] where she will have access to a pool. Pt with extensive surgical and medical Hx including breast cancer (last surgery 2013), DJD with cervical fusion C3-5 in 2006, bipolar disorder, HTN, OA in bilateral shoulders L>R, hip and SIJ pain x >20 years. She states that she needs the accountability of therapy for her to continue her exercises and wants to start therapy again.     Currently in Pain?  No/denies    Multiple Pain Sites  No           TREATMENT: Warm up on Nustep, level 2 BUE/BLE x4 min with history intake;  Gait in hallway: Forward walking with lateral head turns side/side x40 feet x2 sets with min A for safety; Patient exhibits heavy right side veer and is unsteady. She also exhibits increased shuffled steps when asked to call out card number (added cognitive task); When asked about unsteadiness, patient states that she is heavily fatigued and reports that "its not a good day."   Instructed patient in LE strengthening: Hooklying -green tband around BLE: Hip flexion march x10 reps RLE only, LLE is painful Hip abduction/ER 2x15 Hooklying: Bridges with BLE x10 reps; Single leg bridge x10 reps each LE;  Patient required min-moderate verbal/tactile  cues for correct exercise technique including cues to improve weight shift and slow down LE movement for better strengthening;  Sidelying: RLE clamshells red tband x10 reps x2 sets; Required min A for proper positioning for better hip strengthening;   LLE hip abduction SLR 2x10, patient reports increased discomfort with clamshells with left leg; Required min A to avoid trunk rotation for better hip strengthening;  Patient reports slight fatigue at end of session. She denies any increase in pain;                             PT Education - 06/22/18 1140    Education provided  Yes    Education Details  Nurse, learning disability) Educated  Patient    Methods  Explanation;Demonstration;Verbal cues    Comprehension  Verbalized understanding;Returned demonstration;Verbal cues required;Need further instruction       PT Short Term Goals - 06/08/18 1110      PT SHORT TERM GOAL #1   Title  Pt will be independent with land and aquatic HEP in order to improve strength and balance in order to decrease fall risk and improve function at home and work.     Baseline  05/06/18: not performing consistently    Time  6    Period  Weeks    Status  On-going    Target Date  06/17/18      PT SHORT TERM GOAL #2   Title  Pt will appropriately use rollator in order to decrease fall risk and increase community access.     Baseline  uses the rollator when walking out in community; but mostly uses SPC; will walk holding cane, not using cane;     Time  12    Period  Weeks    Status  Not Met    Target Date  06/17/18        PT Long Term Goals - 06/08/18 1111      PT LONG TERM GOAL #1   Title  Patient will increase Functional Gait Assessment score to >20/30 as to reduce fall risk and improve dynamic gait safety with community ambulation.    Baseline  05/07/18: 9/30    Time  12    Period  Weeks    Status  On-going    Target Date  07/29/18      PT LONG TERM GOAL #2    Title  Patient will increase Berg Balance score by > 6 points to demonstrate decreased fall risk during functional activities.; Deferred as patient's balance is more impaired dynamically and therefore FGA goal more appropriate;     Baseline  05/06/18: 22/56; 06/08/18: 35/56    Time  12    Period  Weeks    Status  Achieved    Target Date  07/29/18      PT LONG TERM GOAL #3   Title  Patient (> 8 years old) will complete five times sit to stand test in < 15 seconds indicating an increased LE strength and improved balance.    Baseline  05/06/18: unable to perform without bilateral UE support    Time  12    Period  Weeks    Status  On-going    Target Date  07/29/18      PT LONG TERM GOAL #4   Title  Patient will ascend/descend 4 stairs without rail assist independently without loss of balance to improve ability to get in/out of home.     Baseline  05/07/18: able to negotiate reciprocally but requires rail assist; one step at a time descending with B rail assist;     Time  12    Period  Weeks    Status  Not Met    Target Date  07/29/18            Plan - 06/22/18 1315    Clinical Impression Statement  Patient exhibits increased unsteadiness today. She states that she is more fatigued after getting up early for an MRI this morning. Patient had difficulty with dynamic balance tasks. Instructed patient in LE strengthening to reduce fatigue but facilitate better LE motor control. She required exercise modification to reduce discomfort in hips/low back. She would benefit from additional skilled  PT intervention to improve strength, balance and gait safety;     Rehab Potential  Poor    Clinical Impairments Affecting Rehab Potential  Pt does not use AD and had decreased knowledge of her limitations, poor insight, poor compliance with home program    PT Frequency  1x / week    PT Duration  12 weeks    PT Treatment/Interventions  ADLs/Self Care Home Management;Aquatic Therapy;Biofeedback;Canalith  Repostioning;Cryotherapy;Moist Heat;Cognitive remediation;Neuromuscular re-education;Balance training;Therapeutic exercise;Therapeutic activities;Functional mobility training;Stair training;Gait training;DME Instruction;Patient/family education;Orthotic Fit/Training;Manual techniques;Passive range of motion;Visual/perceptual remediation/compensation;Vestibular;Energy conservation;Dry needling    PT Next Visit Plan  Provide extensive written HEP for land exercises, continue balance and strengthening     PT Home Exercise Plan  Extensive aquatic exercises provided (see pt information), will issue land based HEP at next session    Consulted and Agree with Plan of Care  Patient       Patient will benefit from skilled therapeutic intervention in order to improve the following deficits and impairments:  Abnormal gait, Decreased balance, Decreased safety awareness, Decreased knowledge of use of DME, Decreased knowledge of precautions, Decreased endurance, Decreased strength, Difficulty walking, Postural dysfunction, Pain, Decreased cognition, Decreased coordination  Visit Diagnosis: Unsteadiness on feet  Repeated falls  Muscle weakness (generalized)  Other lack of coordination     Problem List Patient Active Problem List   Diagnosis Date Noted  . Osteoarthritis of hip (Right) 04/29/2018  . Iliotibial band syndrome (Right) 04/29/2018  . Allodynia 04/14/2018  . Spondylosis without myelopathy or radiculopathy, lumbosacral region 03/11/2018  . Other specified dorsopathies, sacral and sacrococcygeal region 03/11/2018  . Ataxic gait (Associated w/ CSF Pressure) 11/20/2017  . Gait instability 11/20/2017  . 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 Cognitive decline 11/20/2017  . PBA (pseudobulbar affect) 11/20/2017  . Truncal ataxia 11/20/2017  . Lumbar facet syndrome (Bilateral) (L>R) 07/29/2017  . Chronic sacroiliac joint pain (Bilateral) (L>R) 07/29/2017  . Chronic hip pain (Bilateral) (L>R)  07/29/2017  . Chronic lower extremity pain (Secondary Area of Pain) (Bilateral) (L>R) 07/29/2017  . Impairment of balance 07/07/2017  . Menopausal and female climacteric states 06/15/2017  . Lumbar foraminal stenosis (T12-S1, multilevel) 06/10/2017  . Lumbar lateral recess stenosis (left L2-3, bilateral L3-4, & right L4-5) 06/10/2017  . Grade 1 Anterolisthesis of L4 over L5 and L5 over S1 06/10/2017  . Lumbar facet hypertrophy (multilevel) 06/10/2017  . Lumbar Ligamentum flavum hypertrophy (HCC) (multilevel) 06/10/2017  . Dropfoot (Left) (L5 radiculopathy) 06/10/2017  . Neurogenic pain 06/09/2017  . DDD (degenerative disc disease), lumbar 06/09/2017  . 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 Closed fracture of metatarsal bone 05/06/2017  . 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 Abnormal MRI, cervical spine 05/06/2017  . 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[place]) 05/06/2017  . Cervical radiculitis 05/06/2017  . Chronic lumbar radiculopathy (Bilateral) (L>R) (left L5) 05/06/2017  . Chronic low back pain (Primary Area of Pain) (Bilateral) (L>R) 05/06/2017  . Abnormal MRI, lumbar spine 05/06/2017  . Chronic pain syndrome 03/19/2017  . Chronic shoulder pain (Fourth Area of Pain) (Bilateral) (L>R) 03/19/2017  . Osteoarthritis of shoulder (Bilateral) 03/19/2017  . Chronic neck pain [facility] Area of Pain) (Bilateral) (L>R) 03/19/2017  . History of C3-5 ACDF 03/19/2017  . Numbness and tingling in left upper extremity 03/19/2017  . Numbness and tingling of right upper extremity 03/19/2017  . Upper extremity weakness (Bilateral) (L>R) 03/19/2017  . Numbness of upper extremity (Bilateral) (L>R)  03/19/2017  . Numbness and tingling of lower extremity (Bilateral) (L>R) 03/19/2017  . Weakness of lower extremity (Bilateral) (R>L) 03/19/2017  . Chronic abdominal pain (epigastric) 03/19/2017  .  Pituitary microadenoma ([place]) 12/08/2016  . History of breast cancer in adulthood 12/08/2016  . Dysmetria 12/08/2016  . Loss of weight 08/15/2015  . Colon polyp 05/25/2015  . Personal history of malignant neoplasm of breast 07/18/2014  . Breast cancer ([place]) 06/28/2014  . Bipolar disorder ([place]) 06/28/2014  . Chronic pancreatitis ([place]) 06/28/2014  . HTN (hypertension) 06/28/2014  . OA (osteoarthritis) 06/28/2014  . Renal insufficiency 06/28/2014  . DDD (degenerative disc disease), cervical 06/28/2014  . Hyperlipidemia 06/28/2014  . COPD (chronic obstructive pulmonary disease) ([place]) 05/17/2014  . Breast cancer of upper-outer quadrant of right female breast ([place]) 05/31/2008    Leightyn Cina PT, DPT 06/22/2018, 1:17 PM  [place] MAIN [facility] SERVICES [place], [place], [address] Phone: [phone]   Fax:  [phone]  Name: Elaine Clayton MRN: [id number] Date of Birth: June 06, 1946

## 2018-06-29 ENCOUNTER — Other Ambulatory Visit: Payer: Self-pay

## 2018-06-29 ENCOUNTER — Ambulatory Visit: Payer: Medicare Other

## 2018-06-29 DIAGNOSIS — R2689 Other abnormalities of gait and mobility: Secondary | ICD-10-CM

## 2018-06-29 DIAGNOSIS — R2681 Unsteadiness on feet: Secondary | ICD-10-CM

## 2018-06-29 DIAGNOSIS — R278 Other lack of coordination: Secondary | ICD-10-CM

## 2018-06-29 DIAGNOSIS — M6281 Muscle weakness (generalized): Secondary | ICD-10-CM

## 2018-06-29 DIAGNOSIS — R296 Repeated falls: Secondary | ICD-10-CM

## 2018-06-29 NOTE — Therapy (Signed)
[place] MAIN [facility] SERVICES [address] [place], [place], [address] Phone: [phone]   Fax:  [phone]  Physical Therapy Treatment  Patient Details  Name: Elaine Clayton MRN: [id number] Date of Birth: 20-Aug-1946 Referring Provider: Dr. Dossie Arbour   Encounter Date: 06/29/2018  PT End of Session - 06/29/18 1727    Visit Number  9    Number of Visits  13    Date for PT Re-Evaluation  07/29/18    PT Start Time  1110    PT Stop Time  1200    PT Time Calculation (min)  50 min    Activity Tolerance  No increased pain;Patient limited by fatigue    Behavior During Therapy  [facility] for tasks assessed/performed       Past Medical History:  Diagnosis Date  . Anemia   . Ataxia   . Bipolar affective disorder ([place])    2  . Broken foot 2015   right foot  . Chronic gastritis   . Chronic kidney disease    Dr Holley Raring  . COPD (chronic obstructive pulmonary disease) ([place])   . Dizziness    inner ear (per pt) several times per week  . Dysphagia   . Glaucoma   . Hyperlipemia   . Hypertension    pt has recently come off BP meds.  MD aware. BP seems stable.  . Lithium toxicity 2015  . 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 Neck stiffness    s/p C3-C4 fusion, limited right turn  . Osteoarthritis    back  . Pancreatitis   . Parkinson disease ([place])   . Personal history of malignant neoplasm of breast   . Pituitary microadenoma ([place])   . Recurrent UTI   . Renal insufficiency   . Stomach ulcer 2112    Past Surgical History:  Procedure Laterality Date  . APPENDECTOMY    . BREAST BIOPSY Right 2011   neg  . BREAST EXCISIONAL BIOPSY Right 2001   neg  . BREAST EXCISIONAL BIOPSY Right 2008   breast ca 2008 radiation  . BREAST EXCISIONAL BIOPSY Left 10/26/2012   neg  . BREAST SURGERY Left  2013   left breast wide excision,Intraductal papilloma, ductal hyperplasia and  sclerosing adenosis. Microcalcifications associated with columnar cell change. No evidence of atypia or malignancy. Margins are unremarkable.  [facility] Kitchen BREAST SURGERY Right November 06, 1999   multiple areas of microcalcification showing evidence of sclerosing adenosis and ductal hyperplasia.  Marland Kitchen BREAST SURGERY Left May 07, 2007   wide excision.  . cataract Right 2013  . CATARACT EXTRACTION W/PHACO Left 03/05/2016   Procedure: CATARACT EXTRACTION PHACO AND INTRAOCULAR LENS PLACEMENT (IOC);  Surgeon: Leandrew Koyanagi, MD;  Location: [place];  Service: Ophthalmology;  Laterality: Left;  . COLONOSCOPY  2004  . COLONOSCOPY N/A 05/14/2015   Procedure: COLONOSCOPY;  Surgeon: Manya Silvas, MD;  Location: [facility] ENDOSCOPY;  Service: Endoscopy;  Laterality: N/A;  . ERCP N/A   . ESOPHAGOGASTRODUODENOSCOPY N/A 05/14/2015   Procedure: ESOPHAGOGASTRODUODENOSCOPY (EGD);  Surgeon: Manya Silvas, MD;  Location: [facility] ENDOSCOPY;  Service: Endoscopy;  Laterality: N/A;  . EUS N/A 2011, 2012  . EYE SURGERY  2013  . GLAUCOMA SURGERY Right 2013  . LAPAROSCOPIC RIGHT COLECTOMY Right 06/22/2015   Procedure: LAPAROSCOPIC RIGHT COLECTOMY;  Surgeon: Robert Bellow, MD;  Location: [facility] ORS;  Service: General;  Laterality: Right;  .  NECK SURGERY  2006  . right breast cancer    . SAVORY DILATION N/A 05/14/2015   Procedure: SAVORY DILATION;  Surgeon: Manya Silvas, MD;  Location: [facility] ENDOSCOPY;  Service: Endoscopy;  Laterality: N/A;  . TRABECULECTOMY Left 03/05/2016   Procedure: TRABECULECTOMY WITH [facility] AND [facility];  Surgeon: Leandrew Koyanagi, MD;  Location: [place];  Service: Ophthalmology;  Laterality: Left;  . TUBAL LIGATION      There were no vitals filed for this visit.  Subjective Assessment - 06/29/18 1724    Subjective  Pt reports having greater balance difficulties as a whole. Pt does feel some is due to back pain, as pt notes if she takes time to take 1/2 Vicodin in the  morning and wait for it to "kick in", pt feels balance is better. Pt overall has noted greater difficulty managing balance and activities about the house. Pt feels fatigued today    Pertinent History  Pt is known to therapy clinic from prior episode. She underwent PT from 08/04/18-04/06/18 including aquatic and land based exercise for a total of 33 visits. She was not compliant during her last therapy episode with performing her home exercise program. At the end of her episode of care her outcome measures were worse than when she started. She recently saw Dr. Dossie Arbour for intraarticular hip injection and IT band injection. She feels like her balance has worsened since ending therapy. At end of last therapy episode pt was provided with an extensive list of community resources however she did not pursue any community based exercise program. She is moving to [employer] sometime in [place] in [place] where she will have access to a pool. Pt with extensive surgical and medical Hx including breast cancer (last surgery 2013), DJD with cervical fusion C3-5 in 2006, bipolar disorder, HTN, OA in bilateral shoulders L>R, hip and SIJ pain x >20 years. She states that she needs the accountability of therapy for her to continue her exercises and wants to start therapy again.       Enters/exits via ramp  Ambulation, blue dumbbells  Fwd   side  Core with LE kicks, 2 sets of 15 ea  hip abd/add  hip flex/ext  Squats  [place] ambulation, 2 L  High knee march, 4 L   Bench  STS 25x                           PT Education - 06/29/18 1727    Education provided  Yes    Education Details  grapevine (emphasized to only be done in pool with assist)    Person(s) Educated  Patient;Spouse    Methods  Explanation;Demonstration;Tactile cues;Verbal cues       PT Short Term Goals - 06/08/18 1110      PT SHORT TERM GOAL #1   Title  Pt will be independent with land  and aquatic HEP in order to improve strength and balance in order to decrease fall risk and improve function at home and work.     Baseline  05/06/18: not performing consistently    Time  6    Period  Weeks    Status  On-going    Target Date  06/17/18      PT SHORT TERM GOAL #2   Title  Pt will appropriately use rollator in order to decrease fall risk and increase community access.     Baseline  uses the  rollator when walking out in community; but mostly uses [place]; will walk holding cane, not using cane;     Time  12    Period  Weeks    Status  Not Met    Target Date  06/17/18        PT Long Term Goals - 06/08/18 1111      PT LONG TERM GOAL #1   Title  Patient will increase Functional Gait Assessment score to >20/30 as to reduce fall risk and improve dynamic gait safety with community ambulation.    Baseline  05/07/18: 9/30    Time  12    Period  Weeks    Status  On-going    Target Date  07/29/18      PT LONG TERM GOAL #2   Title  Patient will increase Berg Balance score by > 6 points to demonstrate decreased fall risk during functional activities.; Deferred as patient's balance is more impaired dynamically and therefore FGA goal more appropriate;     Baseline  05/06/18: 22/56; 06/08/18: 35/56    Time  12    Period  Weeks    Status  Achieved    Target Date  07/29/18      PT LONG TERM GOAL #3   Title  Patient (> 54 years old) will complete five times sit to stand test in < 15 seconds indicating an increased LE strength and improved balance.    Baseline  05/06/18: unable to perform without bilateral UE support    Time  12    Period  Weeks    Status  On-going    Target Date  07/29/18      PT LONG TERM GOAL #4   Title  Patient will ascend/descend 4 stairs without rail assist independently without loss of balance to improve ability to get in/out of home.     Baseline  05/07/18: able to negotiate reciprocally but requires rail assist; one step at a time descending with B rail assist;      Time  12    Period  Weeks    Status  Not Met    Target Date  07/29/18            Plan - 06/29/18 1728    Clinical Impression Statement  Pt requires hands on Min A for balance often throughout todays session. LLE weakness with strengthening exercises apparent with moderate difficulty maintaining good upright posture during LLE kicks. Pt does demonstrate good effort throughout. [place] STS at bench without UE assist or external assist well today.     Rehab Potential  Poor    Clinical Impairments Affecting Rehab Potential  Pt does not use AD and had decreased knowledge of her limitations, poor insight, poor compliance with home program    PT Frequency  1x / week    PT Duration  12 weeks    PT Treatment/Interventions  ADLs/Self Care Home Management;Aquatic Therapy;Biofeedback;Canalith Repostioning;Cryotherapy;Moist Heat;Cognitive remediation;Neuromuscular re-education;Balance training;Therapeutic exercise;Therapeutic activities;Functional mobility training;Stair training;Gait training;DME Instruction;Patient/family education;Orthotic Fit/Training;Manual techniques;Passive range of motion;Visual/perceptual remediation/compensation;Vestibular;Energy conservation;Dry needling    PT Next Visit Plan  Provide extensive written HEP for land exercises, continue balance and strengthening     PT Home Exercise Plan  Extensive aquatic exercises provided (see pt information), will issue land based HEP at next session    Consulted and Agree with Plan of Care  Patient       Patient will benefit from skilled therapeutic intervention in order to improve the following deficits  and impairments:  Abnormal gait, Decreased balance, Decreased safety awareness, Decreased knowledge of use of DME, Decreased knowledge of precautions, Decreased endurance, Decreased strength, Difficulty walking, Postural dysfunction, Pain, Decreased cognition, Decreased coordination  Visit Diagnosis: Unsteadiness on feet  Repeated  falls  Other lack of coordination  Muscle weakness (generalized)  Other abnormalities of gait and mobility     Problem List Patient Active Problem List   Diagnosis Date Noted  . Osteoarthritis of hip (Right) 04/29/2018  . Iliotibial band syndrome (Right) 04/29/2018  . Allodynia 04/14/2018  . Spondylosis without myelopathy or radiculopathy, lumbosacral region 03/11/2018  . Other specified dorsopathies, sacral and sacrococcygeal region 03/11/2018  . Ataxic gait (Associated w/ CSF Pressure) 11/20/2017  . Gait instability 11/20/2017  . 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 Cognitive decline 11/20/2017  . PBA (pseudobulbar affect) 11/20/2017  . Truncal ataxia 11/20/2017  . Lumbar facet syndrome (Bilateral) (L>R) 07/29/2017  . Chronic sacroiliac joint pain (Bilateral) (L>R) 07/29/2017  . Chronic hip pain (Bilateral) (L>R) 07/29/2017  . Chronic lower extremity pain (Secondary Area of Pain) (Bilateral) (L>R) 07/29/2017  . Impairment of balance 07/07/2017  . Menopausal and female climacteric states 06/15/2017  . Lumbar foraminal stenosis (T12-S1, multilevel) 06/10/2017  . Lumbar lateral recess stenosis (left L2-3, bilateral L3-4, & right L4-5) 06/10/2017  . Grade 1 Anterolisthesis of L4 over L5 and L5 over S1 06/10/2017  . Lumbar facet hypertrophy (multilevel) 06/10/2017  . Lumbar Ligamentum flavum hypertrophy (HCC) (multilevel) 06/10/2017  . Dropfoot (Left) (L5 radiculopathy) 06/10/2017  . Neurogenic pain 06/09/2017  . DDD (degenerative disc disease), lumbar 06/09/2017  . 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 Closed fracture of metatarsal bone 05/06/2017  . 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 Abnormal MRI, cervical spine 05/06/2017  . 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[place]) 05/06/2017  . Cervical radiculitis 05/06/2017  . Chronic lumbar radiculopathy (Bilateral)  (L>R) (left L5) 05/06/2017  . Chronic low back pain (Primary Area of Pain) (Bilateral) (L>R) 05/06/2017  . Abnormal MRI, lumbar spine 05/06/2017  . Chronic pain syndrome 03/19/2017  . Chronic shoulder pain (Fourth Area of Pain) (Bilateral) (L>R) 03/19/2017  . Osteoarthritis of shoulder (Bilateral) 03/19/2017  . Chronic neck pain [facility] Area of Pain) (Bilateral) (L>R) 03/19/2017  . History of C3-5 ACDF 03/19/2017  . Numbness and tingling in left upper extremity 03/19/2017  . Numbness and tingling of right upper extremity 03/19/2017  . Upper extremity weakness (Bilateral) (L>R) 03/19/2017  . Numbness of upper extremity (Bilateral) (L>R) 03/19/2017  . Numbness and tingling of lower extremity (Bilateral) (L>R) 03/19/2017  . Weakness of lower extremity (Bilateral) (R>L) 03/19/2017  . Chronic abdominal pain (epigastric) 03/19/2017  . Pituitary microadenoma ([place]) 12/08/2016  . History of breast cancer in adulthood 12/08/2016  . Dysmetria 12/08/2016  . Loss of weight 08/15/2015  . Colon polyp 05/25/2015  . Personal history of malignant neoplasm of breast 07/18/2014  . Breast cancer ([place]) 06/28/2014  . Bipolar disorder ([place]) 06/28/2014  . Chronic pancreatitis ([place]) 06/28/2014  . HTN (hypertension) 06/28/2014  . OA (osteoarthritis) 06/28/2014  . Renal insufficiency 06/28/2014  . DDD (degenerative disc disease), cervical 06/28/2014  . Hyperlipidemia 06/28/2014  . COPD (chronic obstructive pulmonary disease) ([place]) 05/17/2014  . Breast cancer of upper-outer quadrant of right female breast ([place]) 05/31/2008    Larae Grooms 06/29/2018, 5:31 PM  [place] MAIN [facility] SERVICES [address] [place], [place], [address] Phone:  [phone]   Fax:  [phone]  Name: Elaine Clayton MRN: [id number] Date of Birth: 1946/12/20

## 2018-07-06 ENCOUNTER — Ambulatory Visit: Payer: Medicare Other | Admitting: Physical Therapy

## 2018-07-12 ENCOUNTER — Ambulatory Visit
Admission: RE | Admit: 2018-07-12 | Discharge: 2018-07-12 | Disposition: A | Discharge status: Home / Self Care | Payer: Medicare Other | Source: Ambulatory Visit | Attending: General Surgery | Admitting: General Surgery

## 2018-07-12 DIAGNOSIS — Z1231 Encounter for screening mammogram for malignant neoplasm of breast: Secondary | ICD-10-CM | POA: Insufficient documentation

## 2018-07-13 ENCOUNTER — Other Ambulatory Visit: Payer: Self-pay

## 2018-07-13 ENCOUNTER — Other Ambulatory Visit: Payer: Self-pay | Admitting: General Surgery

## 2018-07-13 ENCOUNTER — Ambulatory Visit: Payer: Medicare Other

## 2018-07-13 DIAGNOSIS — R2689 Other abnormalities of gait and mobility: Secondary | ICD-10-CM

## 2018-07-13 DIAGNOSIS — R928 Other abnormal and inconclusive findings on diagnostic imaging of breast: Secondary | ICD-10-CM

## 2018-07-13 DIAGNOSIS — M6281 Muscle weakness (generalized): Secondary | ICD-10-CM

## 2018-07-13 DIAGNOSIS — R278 Other lack of coordination: Secondary | ICD-10-CM

## 2018-07-13 DIAGNOSIS — R921 Mammographic calcification found on diagnostic imaging of breast: Secondary | ICD-10-CM

## 2018-07-13 DIAGNOSIS — R2681 Unsteadiness on feet: Secondary | ICD-10-CM | POA: Diagnosis not present

## 2018-07-13 DIAGNOSIS — R296 Repeated falls: Secondary | ICD-10-CM

## 2018-07-13 NOTE — Therapy (Signed)
[place] MAIN [facility] SERVICES [address] [place], [place], [address] Phone: [phone]   Fax:  [phone]  Physical Therapy Treatment  Patient Details  Name: Elaine Clayton MRN: [id number] Date of Birth: 08/17/1946 Referring Provider: Dr. Dossie Arbour   Encounter Date: 07/13/2018  PT End of Session - 07/13/18 1457    Visit Number  10    Number of Visits  13    Date for PT Re-Evaluation  07/29/18    PT Start Time  1115    PT Stop Time  1230    PT Time Calculation (min)  75 min    Activity Tolerance  No increased pain;Patient limited by fatigue    Behavior During Therapy  [facility] for tasks assessed/performed       Past Medical History:  Diagnosis Date  . Anemia   . Ataxia   . Bipolar affective disorder ([place])    2  . Broken foot 2015   right foot  . Chronic gastritis   . Chronic kidney disease    Dr Holley Raring  . COPD (chronic obstructive pulmonary disease) ([place])   . Dizziness    inner ear (per pt) several times per week  . Dysphagia   . Glaucoma   . Hyperlipemia   . Hypertension    pt has recently come off BP meds.  MD aware. BP seems stable.  . Lithium toxicity 2015  . 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 Neck stiffness    s/p C3-C4 fusion, limited right turn  . Osteoarthritis    back  . Pancreatitis   . Parkinson disease ([place])   . Personal history of malignant neoplasm of breast   . Pituitary microadenoma ([place])   . Recurrent UTI   . Renal insufficiency   . Stomach ulcer 2112    Past Surgical History:  Procedure Laterality Date  . APPENDECTOMY    . BREAST BIOPSY Right 2011   neg  . BREAST EXCISIONAL BIOPSY Right 2001   neg  . BREAST EXCISIONAL BIOPSY Right 2008   breast ca 2008 radiation  . BREAST EXCISIONAL BIOPSY Left 10/26/2012   neg  . BREAST SURGERY Left  2013   left breast wide excision,Intraductal papilloma, ductal hyperplasia and  sclerosing adenosis. Microcalcifications associated with columnar cell change. No evidence of atypia or malignancy. Margins are unremarkable.  Marland Kitchen BREAST SURGERY Right November 06, 1999   multiple areas of microcalcification showing evidence of sclerosing adenosis and ductal hyperplasia.  Marland Kitchen BREAST SURGERY Left May 07, 2007   wide excision.  . cataract Right 2013  . CATARACT EXTRACTION W/PHACO Left 03/05/2016   Procedure: CATARACT EXTRACTION PHACO AND INTRAOCULAR LENS PLACEMENT (IOC);  Surgeon: Leandrew Koyanagi, MD;  Location: [place];  Service: Ophthalmology;  Laterality: Left;  . COLONOSCOPY  2004  . COLONOSCOPY N/A 05/14/2015   Procedure: COLONOSCOPY;  Surgeon: Manya Silvas, MD;  Location: [facility] ENDOSCOPY;  Service: Endoscopy;  Laterality: N/A;  . ERCP N/A   . ESOPHAGOGASTRODUODENOSCOPY N/A 05/14/2015   Procedure: ESOPHAGOGASTRODUODENOSCOPY (EGD);  Surgeon: Manya Silvas, MD;  Location: [facility] ENDOSCOPY;  Service: Endoscopy;  Laterality: N/A;  . EUS N/A 2011, 2012  . EYE SURGERY  2013  . GLAUCOMA SURGERY Right 2013  . LAPAROSCOPIC RIGHT COLECTOMY Right 06/22/2015   Procedure: LAPAROSCOPIC RIGHT COLECTOMY;  Surgeon: Robert Bellow, MD;  Location: [facility] ORS;  Service: General;  Laterality: Right;  .  NECK SURGERY  2006  . right breast cancer    . SAVORY DILATION N/A 05/14/2015   Procedure: SAVORY DILATION;  Surgeon: Manya Silvas, MD;  Location: [facility] ENDOSCOPY;  Service: Endoscopy;  Laterality: N/A;  . TRABECULECTOMY Left 03/05/2016   Procedure: TRABECULECTOMY WITH [facility];  Surgeon: Leandrew Koyanagi, MD;  Location: [place];  Service: Ophthalmology;  Laterality: Left;  . TUBAL LIGATION      There were no vitals filed for this visit.  Subjective Assessment - 07/13/18 1453    Subjective  Pt reports balance has been "pretty good" the last couple of days. Pt notes she "can tell" how her balance will be for the day within a short time of being up  and about. Denies any pain or significant fall recently.     Patient is accompained by:  Family member    Pertinent History  Pt is known to therapy clinic from prior episode. She underwent PT from 08/04/18-04/06/18 including aquatic and land based exercise for a total of 33 visits. She was not compliant during her last therapy episode with performing her home exercise program. At the end of her episode of care her outcome measures were worse than when she started. She recently saw Dr. Dossie Arbour for intraarticular hip injection and IT band injection. She feels like her balance has worsened since ending therapy. At end of last therapy episode pt was provided with an extensive list of community resources however she did not pursue any community based exercise program. She is moving to [employer] sometime in [place] in [place] where she will have access to a pool. Pt with extensive surgical and medical Hx including breast cancer (last surgery 2013), DJD with cervical fusion C3-5 in 2006, bipolar disorder, HTN, OA in bilateral shoulders L>R, hip and SIJ pain x >20 years. She states that she needs the accountability of therapy for her to continue her exercises and wants to start therapy again.       Enters/exits pool via ramp  Ambulation  Fwd 4 L  Side 4 L   Balance ambulation, 1 noodle support  2 L high knee march, noodle to side  2 L ext, noodle in front  2 L grapevine (only front cross over), 1 R 1 L, noodle in front  2 L side step with squat, no UE support  Suspended work, increased time for learning with/without UE support and use of various UE movements  Jog  Jack  Ski, requires UE support  X jack   Core with LE strength  Side kicks, 20x  Fwd kicks, 20x  Resisted squat with noodle, 20x  Core with UE strength, lunge position (performed with noodles and speed for resist)  Triceps press downs  Sh abd/add  Sh flex/ext  Sh horiz abd/add  Balance/strength  Fwd  lunges  Side lunges  Active stretching cool down  hamstrings/hip flexor and quad  LB/flank                          PT Education - 07/13/18 1455    Education provided  Yes    Education Details  written program therapist had to provide (making revisions and providing 1 for off and one for on balance days (will have for land session 07/20/18.       PT Short Term Goals - 06/08/18 1110      PT SHORT TERM GOAL #1   Title  Pt will be independent with land and aquatic HEP in order to improve strength and balance in order to decrease fall risk and improve function at home and work.     Baseline  05/06/18: not performing consistently    Time  6    Period  Weeks    Status  On-going    Target Date  06/17/18      PT SHORT TERM GOAL #2   Title  Pt will appropriately use rollator in order to decrease fall risk and increase community access.     Baseline  uses the rollator when walking out in community; but mostly uses SPC; will walk holding cane, not using cane;     Time  12    Period  Weeks    Status  Not Met    Target Date  06/17/18        PT Long Term Goals - 06/08/18 1111      PT LONG TERM GOAL #1   Title  Patient will increase Functional Gait Assessment score to >20/30 as to reduce fall risk and improve dynamic gait safety with community ambulation.    Baseline  05/07/18: 9/30    Time  12    Period  Weeks    Status  On-going    Target Date  07/29/18      PT LONG TERM GOAL #2   Title  Patient will increase Berg Balance score by > 6 points to demonstrate decreased fall risk during functional activities.; Deferred as patient's balance is more impaired dynamically and therefore FGA goal more appropriate;     Baseline  05/06/18: 22/56; 06/08/18: 35/56    Time  12    Period  Weeks    Status  Achieved    Target Date  07/29/18      PT LONG TERM GOAL #3   Title  Patient (> 56 years old) will complete five times sit to stand test in < 15 seconds indicating an  increased LE strength and improved balance.    Baseline  05/06/18: unable to perform without bilateral UE support    Time  12    Period  Weeks    Status  On-going    Target Date  07/29/18      PT LONG TERM GOAL #4   Title  Patient will ascend/descend 4 stairs without rail assist independently without loss of balance to improve ability to get in/out of home.     Baseline  05/07/18: able to negotiate reciprocally but requires rail assist; one step at a time descending with B rail assist;     Time  12    Period  Weeks    Status  Not Met    Target Date  07/29/18            Plan - 07/13/18 1458    Clinical Impression Statement  Pt performed exercise very well today allowing for more advanced position for core with UE strengthening and less support for some balance activities. Discussion with pt regarding present and past sessions concludes pt would be best served to have 2 programs of focus: 1 for balance difficulty days (discussed need for assist/greater supervision in water) and 1 for good balance days. Pt/spouse both agree. Pt/spouse both educated on use of noodles for support as well as resist and appropriate times/need for use. Will provide primary therapist with revised programs to given to pt at next visit 07/20/18.     Rehab Potential  Poor  Clinical Impairments Affecting Rehab Potential  Pt does not use AD and had decreased knowledge of her limitations, poor insight, poor compliance with home program    PT Frequency  1x / week    PT Duration  12 weeks    PT Treatment/Interventions  ADLs/Self Care Home Management;Aquatic Therapy;Biofeedback;Canalith Repostioning;Cryotherapy;Moist Heat;Cognitive remediation;Neuromuscular re-education;Balance training;Therapeutic exercise;Therapeutic activities;Functional mobility training;Stair training;Gait training;DME Instruction;Patient/family education;Orthotic Fit/Training;Manual techniques;Passive range of motion;Visual/perceptual  remediation/compensation;Vestibular;Energy conservation;Dry needling    PT Next Visit Plan  Provide extensive written HEP for land exercises, continue balance and strengthening     PT Home Exercise Plan  Extensive aquatic exercises provided (see pt information), will issue land based HEP at next session    Consulted and Agree with Plan of Care  Patient       Patient will benefit from skilled therapeutic intervention in order to improve the following deficits and impairments:  Abnormal gait, Decreased balance, Decreased safety awareness, Decreased knowledge of use of DME, Decreased knowledge of precautions, Decreased endurance, Decreased strength, Difficulty walking, Postural dysfunction, Pain, Decreased cognition, Decreased coordination  Visit Diagnosis: Unsteadiness on feet  Repeated falls  Other lack of coordination  Muscle weakness (generalized)  Other abnormalities of gait and mobility     Problem List Patient Active Problem List   Diagnosis Date Noted  . Osteoarthritis of hip (Right) 04/29/2018  . Iliotibial band syndrome (Right) 04/29/2018  . Allodynia 04/14/2018  . Spondylosis without myelopathy or radiculopathy, lumbosacral region 03/11/2018  . Other specified dorsopathies, sacral and sacrococcygeal region 03/11/2018  . Ataxic gait (Associated w/ CSF Pressure) 11/20/2017  . Gait instability 11/20/2017  . 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 Cognitive decline 11/20/2017  . PBA (pseudobulbar affect) 11/20/2017  . Truncal ataxia 11/20/2017  . Lumbar facet syndrome (Bilateral) (L>R) 07/29/2017  . Chronic sacroiliac joint pain (Bilateral) (L>R) 07/29/2017  . Chronic hip pain (Bilateral) (L>R) 07/29/2017  . Chronic lower extremity pain (Secondary Area of Pain) (Bilateral) (L>R) 07/29/2017  . Impairment of balance 07/07/2017  . Menopausal and female climacteric states 06/15/2017  . Lumbar foraminal stenosis (T12-S1, multilevel) 06/10/2017  . Lumbar lateral recess  stenosis (left L2-3, bilateral L3-4, & right L4-5) 06/10/2017  . Grade 1 Anterolisthesis of L4 over L5 and L5 over S1 06/10/2017  . Lumbar facet hypertrophy (multilevel) 06/10/2017  . Lumbar Ligamentum flavum hypertrophy (HCC) (multilevel) 06/10/2017  . Dropfoot (Left) (L5 radiculopathy) 06/10/2017  . Neurogenic pain 06/09/2017  . DDD (degenerative disc disease), lumbar 06/09/2017  . 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 Closed fracture of metatarsal bone 05/06/2017  . 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 Abnormal MRI, cervical spine 05/06/2017  . 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[place]) 05/06/2017  . Cervical radiculitis 05/06/2017  . Chronic lumbar radiculopathy (Bilateral) (L>R) (left L5) 05/06/2017  . Chronic low back pain (Primary Area of Pain) (Bilateral) (L>R) 05/06/2017  . Abnormal MRI, lumbar spine 05/06/2017  . Chronic pain syndrome 03/19/2017  . Chronic shoulder pain (Fourth Area of Pain) (Bilateral) (L>R) 03/19/2017  . Osteoarthritis of shoulder (Bilateral) 03/19/2017  . Chronic neck pain [facility] Area of Pain) (Bilateral) (L>R) 03/19/2017  . History of C3-5 ACDF 03/19/2017  . Numbness and tingling in left upper extremity 03/19/2017  . Numbness and tingling of right upper extremity 03/19/2017  . Upper extremity weakness (Bilateral) (L>R) 03/19/2017  . Numbness of upper extremity (Bilateral) (L>R) 03/19/2017  . Numbness and tingling of lower extremity (Bilateral) (L>R) 03/19/2017  .  Weakness of lower extremity (Bilateral) (R>L) 03/19/2017  . Chronic abdominal pain (epigastric) 03/19/2017  . Pituitary microadenoma ([place]) 12/08/2016  . History of breast cancer in adulthood 12/08/2016  . Dysmetria 12/08/2016  . Loss of weight 08/15/2015  . Colon polyp 05/25/2015  . Personal history of malignant neoplasm of breast 07/18/2014  . Breast cancer ([place]) 06/28/2014  .  Bipolar disorder ([place]) 06/28/2014  . Chronic pancreatitis ([place]) 06/28/2014  . HTN (hypertension) 06/28/2014  . OA (osteoarthritis) 06/28/2014  . Renal insufficiency 06/28/2014  . DDD (degenerative disc disease), cervical 06/28/2014  . Hyperlipidemia 06/28/2014  . COPD (chronic obstructive pulmonary disease) ([place]) 05/17/2014  . Breast cancer of upper-outer quadrant of right female breast ([place]) 05/31/2008    Elaine Clayton 07/13/2018, 3:03 PM  [place] MAIN [facility] SERVICES [address] [place], [place], [address] Phone: [phone]   Fax:  [phone]  Name: Elaine Clayton MRN: [id number] Date of Birth: 1946/02/08

## 2018-07-13 NOTE — Patient Instructions (Signed)
Providing pt with written and lamenated (had available today, which was used for session- see treatment note-) Will make a few revisions and provide 2. Primary therapist will given to pt 07/20/18

## 2018-07-14 ENCOUNTER — Ambulatory Visit: Payer: Medicare Other

## 2018-07-20 ENCOUNTER — Encounter: Payer: Self-pay | Admitting: Physical Therapy

## 2018-07-20 ENCOUNTER — Ambulatory Visit: Payer: Medicare Other | Admitting: Physical Therapy

## 2018-07-20 DIAGNOSIS — R2681 Unsteadiness on feet: Secondary | ICD-10-CM | POA: Diagnosis not present

## 2018-07-20 DIAGNOSIS — R278 Other lack of coordination: Secondary | ICD-10-CM

## 2018-07-20 DIAGNOSIS — M6281 Muscle weakness (generalized): Secondary | ICD-10-CM

## 2018-07-20 DIAGNOSIS — R296 Repeated falls: Secondary | ICD-10-CM

## 2018-07-20 DIAGNOSIS — R2689 Other abnormalities of gait and mobility: Secondary | ICD-10-CM

## 2018-07-20 NOTE — Therapy (Signed)
[place] MAIN [facility] SERVICES [address] [place], [place], [address] Phone: [phone]   Fax:  [phone]  Physical Therapy Treatment/Discharge Summary  Patient Details  Name: Elaine Clayton MRN: [id number] Date of Birth: 02/08/1946 Referring Provider: Dr. Dossie Arbour   Encounter Date: 07/20/2018  PT End of Session - 07/20/18 1300    Visit Number  11    Number of Visits  13    Date for PT Re-Evaluation  07/29/18    PT Start Time  1155    PT Stop Time  1230    PT Time Calculation (min)  35 min    Activity Tolerance  No increased pain;Patient limited by fatigue    Behavior During Therapy  [facility] for tasks assessed/performed       Past Medical History:  Diagnosis Date  . Anemia   . Ataxia   . Bipolar affective disorder ([place])    2  . Broken foot 2015   right foot  . Chronic gastritis   . Chronic kidney disease    Dr Holley Raring  . COPD (chronic obstructive pulmonary disease) ([place])   . Dizziness    inner ear (per pt) several times per week  . Dysphagia   . Glaucoma   . Hyperlipemia   . Hypertension    pt has recently come off BP meds.  MD aware. BP seems stable.  . Lithium toxicity 2015  . 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 Neck stiffness    s/p C3-C4 fusion, limited right turn  . Osteoarthritis    back  . Pancreatitis   . Parkinson disease ([place])   . Personal history of malignant neoplasm of breast   . Pituitary microadenoma ([place])   . Recurrent UTI   . Renal insufficiency   . Stomach ulcer 2112    Past Surgical History:  Procedure Laterality Date  . APPENDECTOMY    . BREAST BIOPSY Right 2011   neg  . BREAST EXCISIONAL BIOPSY Right 2001   neg  . BREAST EXCISIONAL BIOPSY Right 2008   breast ca 2008 radiation  . BREAST EXCISIONAL BIOPSY Left 10/26/2012   neg  . BREAST SURGERY Left  2013   left breast wide excision,Intraductal papilloma, ductal  hyperplasia and sclerosing adenosis. Microcalcifications associated with columnar cell change. No evidence of atypia or malignancy. Margins are unremarkable.  Marland Kitchen BREAST SURGERY Right November 06, 1999   multiple areas of microcalcification showing evidence of sclerosing adenosis and ductal hyperplasia.  Marland Kitchen BREAST SURGERY Left May 07, 2007   wide excision.  . cataract Right 2013  . CATARACT EXTRACTION W/PHACO Left 03/05/2016   Procedure: CATARACT EXTRACTION PHACO AND INTRAOCULAR LENS PLACEMENT (IOC);  Surgeon: Leandrew Koyanagi, MD;  Location: [place];  Service: Ophthalmology;  Laterality: Left;  . COLONOSCOPY  2004  . COLONOSCOPY N/A 05/14/2015   Procedure: COLONOSCOPY;  Surgeon: Manya Silvas, MD;  Location: [facility] ENDOSCOPY;  Service: Endoscopy;  Laterality: N/A;  . ERCP N/A   . ESOPHAGOGASTRODUODENOSCOPY N/A 05/14/2015   Procedure: ESOPHAGOGASTRODUODENOSCOPY (EGD);  Surgeon: Manya Silvas, MD;  Location: [facility] ENDOSCOPY;  Service: Endoscopy;  Laterality: N/A;  . EUS N/A 2011, 2012  . EYE SURGERY  2013  . GLAUCOMA SURGERY Right 2013  . LAPAROSCOPIC RIGHT COLECTOMY Right 06/22/2015   Procedure: LAPAROSCOPIC RIGHT COLECTOMY;  Surgeon: Robert Bellow, MD;  Location: [facility] ORS;  Service: General;  Laterality: Right;  .  NECK SURGERY  2006  . right breast cancer    . SAVORY DILATION N/A 05/14/2015   Procedure: SAVORY DILATION;  Surgeon: Manya Silvas, MD;  Location: [facility] ENDOSCOPY;  Service: Endoscopy;  Laterality: N/A;  . TRABECULECTOMY Left 03/05/2016   Procedure: TRABECULECTOMY WITH [facility] AND [facility]T;  Surgeon: Leandrew Koyanagi, MD;  Location: [place];  Service: Ophthalmology;  Laterality: Left;  . TUBAL LIGATION      There were no vitals filed for this visit.  Subjective Assessment - 07/20/18 1201    Subjective  Patient reports continued soreness in left low back with weakness in LLE. She reports, "I would like to continue with therapy if I can. we are  moving in 3 weeks and they do have therapy at the other facility but it will take a while to set it up."     Patient is accompained by:  Family member    Pertinent History  Pt is known to therapy clinic from prior episode. She underwent PT from 08/04/69-04/06/69 including aquatic and land based exercise for a total of 33 visits. She was not compliant during her last therapy episode with performing her home exercise program. At the end of her episode of care her outcome measures were worse than when she started. She recently saw Dr. Dossie Arbour for intraarticular hip injection and IT band injection. She feels like her balance has worsened since ending therapy. At end of last therapy episode pt was provided with an extensive list of community resources however she did not pursue any community based exercise program. She is moving to [employer] sometime in [place] in [place] where she will have access to a pool. Pt with extensive surgical and medical Hx including breast cancer (last surgery 2013), DJD with cervical fusion C3-5 in 2006, bipolar disorder, HTN, OA in bilateral shoulders L>R, hip and SIJ pain x >20 years. She states that she needs the accountability of therapy for her to continue her exercises and wants to start therapy again.     Currently in Pain?  Yes    Pain Score  5     Pain Location  Back    Pain Orientation  Left;Lower    Pain Descriptors / Indicators  Constant;Discomfort    Pain Type  Chronic pain    Pain Onset  More than a month ago    Pain Frequency  Constant    Aggravating Factors   worse with cold weather, prolonged sitting/standing    Pain Relieving Factors  pain meds/rest    Effect of Pain on Daily Activities  decreased activity tolerance;     Multiple Pain Sites  No         [facility] PT Assessment - 07/20/18 0001      Standardized Balance Assessment   Five times sit to stand comments   29 sec without UE, >15s indicates increased risk for falls (In  may unable to do sit<>Stand without pushing on chair)   10 Meter Walk  0.833 m/s without AD, home ambulator, limited community ambulator      Functional Gait  Assessment   Gait Level Surface  Walks 20 ft in less than 7 sec but greater than 5.5 sec, uses assistive device, slower speed, mild gait deviations, or deviates 6-10 in outside of the 12 in walkway width.    Change in Gait Speed  Able to change speed, demonstrates mild gait deviations, deviates 6-10 in outside of the 12 in walkway width, or  no gait deviations, unable to achieve a major change in velocity, or uses a change in velocity, or uses an assistive device.    Gait with Horizontal Head Turns  Performs head turns with moderate changes in gait velocity, slows down, deviates 10-15 in outside 12 in walkway width but recovers, can continue to walk.    Gait with Vertical Head Turns  Performs task with moderate change in gait velocity, slows down, deviates 10-15 in outside 12 in walkway width but recovers, can continue to walk.    Gait and Pivot Turn  Pivot turns safely within 3 sec and stops quickly with no loss of balance.    Step Over Obstacle  Is able to step over one shoe box (4.5 in total height) but must slow down and adjust steps to clear box safely. May require verbal cueing.    Gait with Narrow Base of Support  Ambulates less than 4 steps heel to toe or cannot perform without assistance.    Gait with Eyes Closed  Walks 20 ft, slow speed, abnormal gait pattern, evidence for imbalance, deviates 10-15 in outside 12 in walkway width. Requires more than 9 sec to ambulate 20 ft.    Ambulating Backwards  Walks 20 ft, slow speed, abnormal gait pattern, evidence for imbalance, deviates 10-15 in outside 12 in walkway width.    Steps  Two feet to a stair, must use rail.    Total Score  13  (high fall risk, improved from June 2019 which was 9/30      TREATMENT: Warm up on Nustep BUE/BLE  Instructed patient in 5 times sit<>Stand and FGA to  address goals; See above;  Patient is ambulating at a home ambulator speed. She is still considered a limited community ambulator; Patient has a HEP for the pool. She plans to continue with exercise in the pool once she changes facilities.                  PT Education - 07/20/18 1300    Education provided  Yes    Education Details  progress towards goals, discharge planning;     Person(s) Educated  Patient    Methods  Explanation    Comprehension  Verbalized understanding       PT Short Term Goals - 07/20/18 1208      PT SHORT TERM GOAL #1   Title  Pt will be independent with land and aquatic HEP in order to improve strength and balance in order to decrease fall risk and improve function at home and work.     Baseline  has written HEP and is independent    Time  6    Period  Weeks    Status  Achieved      PT SHORT TERM GOAL #2   Title  Pt will appropriately use rollator in order to decrease fall risk and increase community access.     Baseline  uses the rollator when walking out in community; but mostly uses SPC; will walk holding cane, not using cane; 07/20/18: inconsistent, will use SPC sometimes;     Time  12    Period  Weeks    Status  On-going        PT Long Term Goals - 07/20/18 1210      PT LONG TERM GOAL #1   Title  Patient will increase Functional Gait Assessment score to >20/30 as to reduce fall risk and improve dynamic gait safety with community ambulation.  Baseline  05/07/18: 9/30; 7/30: 13/30    Time  12    Period  Weeks    Status  Partially Met    Target Date  07/29/18      PT LONG TERM GOAL #2   Title  Patient will increase Berg Balance score by > 6 points to demonstrate decreased fall risk during functional activities.; Deferred as patient's balance is more impaired dynamically and therefore FGA goal more appropriate;     Baseline  05/06/18: 22/56; 06/08/18: 35/56;     Time  12    Period  Weeks    Status  Achieved    Target Date   07/29/18      PT LONG TERM GOAL #3   Title  Patient (> 32 years old) will complete five times sit to stand test in < 15 seconds indicating an increased LE strength and improved balance.    Baseline  05/06/18: unable to perform without bilateral UE support; 7/30: 29 sec without HHA    Time  12    Period  Weeks    Status  Partially Met    Target Date  07/29/18      PT LONG TERM GOAL #4   Title  Patient will ascend/descend 4 stairs without rail assist independently without loss of balance to improve ability to get in/out of home.     Baseline  05/07/18: able to negotiate reciprocally but requires rail assist; one step at a time descending with B rail assist; 7/30:     Time  12    Period  Weeks    Status  Not Met    Target Date  07/29/18            Plan - 07/20/18 1300    Clinical Impression Statement  Patient exhibits improved balance however is still considered at an increased risk for falls. She reports less discomfort when doing pool exercise. Patient is supposed to be moving from a private residence to independent living at [facility]. She plans to continue with pool exercise at the new facility. Patient is still considered at an increased risk for falls. She also continues to have weakness in BLE however left greater than right. She is currently walking with SPC with supervision. Recommend discharge from PT at this time as patient is moving. She will pursue PT at her new facility.     Rehab Potential  Poor    Clinical Impairments Affecting Rehab Potential  Pt does not use AD and had decreased knowledge of her limitations, poor insight, poor compliance with home program    PT Frequency  1x / week    PT Duration  12 weeks    PT Treatment/Interventions  ADLs/Self Care Home Management;Aquatic Therapy;Biofeedback;Canalith Repostioning;Cryotherapy;Moist Heat;Cognitive remediation;Neuromuscular re-education;Balance training;Therapeutic exercise;Therapeutic activities;Functional mobility  training;Stair training;Gait training;DME Instruction;Patient/family education;Orthotic Fit/Training;Manual techniques;Passive range of motion;Visual/perceptual remediation/compensation;Vestibular;Energy conservation;Dry needling    PT Next Visit Plan  Provide extensive written HEP for land exercises, continue balance and strengthening     PT Home Exercise Plan  Extensive aquatic exercises provided (see pt information), will issue land based HEP at next session    Consulted and Agree with Plan of Care  Patient       Patient will benefit from skilled therapeutic intervention in order to improve the following deficits and impairments:  Abnormal gait, Decreased balance, Decreased safety awareness, Decreased knowledge of use of DME, Decreased knowledge of precautions, Decreased endurance, Decreased strength, Difficulty walking, Postural dysfunction, Pain, Decreased cognition, Decreased  coordination  Visit Diagnosis: Unsteadiness on feet  Repeated falls  Other lack of coordination  Muscle weakness (generalized)  Other abnormalities of gait and mobility     Problem List Patient Active Problem List   Diagnosis Date Noted  . Osteoarthritis of hip (Right) 04/29/2018  . Iliotibial band syndrome (Right) 04/29/2018  . Allodynia 04/14/2018  . Spondylosis without myelopathy or radiculopathy, lumbosacral region 03/11/2018  . Other specified dorsopathies, sacral and sacrococcygeal region 03/11/2018  . Ataxic gait (Associated w/ CSF Pressure) 11/20/2017  . Gait instability 11/20/2017  . 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 Cognitive decline 11/20/2017  . PBA (pseudobulbar affect) 11/20/2017  . Truncal ataxia 11/20/2017  . Lumbar facet syndrome (Bilateral) (L>R) 07/29/2017  . Chronic sacroiliac joint pain (Bilateral) (L>R) 07/29/2017  . Chronic hip pain (Bilateral) (L>R) 07/29/2017  . Chronic lower extremity pain (Secondary Area of Pain) (Bilateral) (L>R) 07/29/2017  . Impairment of  balance 07/07/2017  . Menopausal and female climacteric states 06/15/2017  . Lumbar foraminal stenosis (T12-S1, multilevel) 06/10/2017  . Lumbar lateral recess stenosis (left L2-3, bilateral L3-4, & right L4-5) 06/10/2017  . Grade 1 Anterolisthesis of L4 over L5 and L5 over S1 06/10/2017  . Lumbar facet hypertrophy (multilevel) 06/10/2017  . Lumbar Ligamentum flavum hypertrophy (HCC) (multilevel) 06/10/2017  . Dropfoot (Left) (L5 radiculopathy) 06/10/2017  . Neurogenic pain 06/09/2017  . DDD (degenerative disc disease), lumbar 06/09/2017  . 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 Closed fracture of metatarsal bone 05/06/2017  . 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 Abnormal MRI, cervical spine 05/06/2017  . 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[place]) 05/06/2017  . Cervical radiculitis 05/06/2017  . Chronic lumbar radiculopathy (Bilateral) (L>R) (left L5) 05/06/2017  . Chronic low back pain (Primary Area of Pain) (Bilateral) (L>R) 05/06/2017  . Abnormal MRI, lumbar spine 05/06/2017  . Chronic pain syndrome 03/19/2017  . Chronic shoulder pain (Fourth Area of Pain) (Bilateral) (L>R) 03/19/2017  . Osteoarthritis of shoulder (Bilateral) 03/19/2017  . Chronic neck pain [facility] Area of Pain) (Bilateral) (L>R) 03/19/2017  . History of C3-5 ACDF 03/19/2017  . Numbness and tingling in left upper extremity 03/19/2017  . Numbness and tingling of right upper extremity 03/19/2017  . Upper extremity weakness (Bilateral) (L>R) 03/19/2017  . Numbness of upper extremity (Bilateral) (L>R) 03/19/2017  . Numbness and tingling of lower extremity (Bilateral) (L>R) 03/19/2017  . Weakness of lower extremity (Bilateral) (R>L) 03/19/2017  . Chronic abdominal pain (epigastric) 03/19/2017  . Pituitary microadenoma ([place]) 12/08/2016  . History of breast cancer in adulthood 12/08/2016  . Dysmetria 12/08/2016   . Loss of weight 08/15/2015  . Colon polyp 05/25/2015  . Personal history of malignant neoplasm of breast 07/18/2014  . Breast cancer ([place]) 06/28/2014  . Bipolar disorder ([place]) 06/28/2014  . Chronic pancreatitis ([place]) 06/28/2014  . HTN (hypertension) 06/28/2014  . OA (osteoarthritis) 06/28/2014  . Renal insufficiency 06/28/2014  . DDD (degenerative disc disease), cervical 06/28/2014  . Hyperlipidemia 06/28/2014  . COPD (chronic obstructive pulmonary disease) ([place]) 05/17/2014  . Breast cancer of upper-outer quadrant of right female breast ([place]) 05/31/2008    Bryndan Bilyk PT, DPT 07/20/2018, 5:10 PM  [place] MAIN [facility] SERVICES [place], [place], [address] Phone: [phone]   Fax:  [phone]  Name: SWAYZE KOZUCH MRN: [id number] Date of Birth: 04/20/1946

## 2018-07-21 ENCOUNTER — Ambulatory Visit
Admission: RE | Admit: 2018-07-21 | Discharge: 2018-07-21 | Disposition: A | Discharge status: Home / Self Care | Payer: Medicare Other | Source: Ambulatory Visit | Attending: General Surgery | Admitting: General Surgery

## 2018-07-21 DIAGNOSIS — R928 Other abnormal and inconclusive findings on diagnostic imaging of breast: Secondary | ICD-10-CM

## 2018-07-21 DIAGNOSIS — R921 Mammographic calcification found on diagnostic imaging of breast: Secondary | ICD-10-CM | POA: Diagnosis present

## 2018-07-27 ENCOUNTER — Ambulatory Visit (INDEPENDENT_AMBULATORY_CARE_PROVIDER_SITE_OTHER): Payer: Medicare Other | Admitting: General Surgery

## 2018-07-27 ENCOUNTER — Encounter: Payer: Self-pay | Admitting: General Surgery

## 2018-07-27 VITALS — BP 118/70 | HR 68 | Resp 13 | Ht 64.0 in | Wt 106.0 lb

## 2018-07-27 DIAGNOSIS — Z853 Personal history of malignant neoplasm of breast: Secondary | ICD-10-CM | POA: Diagnosis not present

## 2018-07-27 DIAGNOSIS — R920 Mammographic microcalcification found on diagnostic imaging of breast: Secondary | ICD-10-CM | POA: Insufficient documentation

## 2018-07-27 NOTE — Patient Instructions (Signed)
  Patient to return on six months left diagnotic mammogram . The patient is aware to call back for any questions or concerns.

## 2018-07-27 NOTE — Progress Notes (Signed)
Patient ID: Elaine Clayton, female   DOB: [birth year], 72 y.o.   MRN: [id number]  Chief Complaint  Patient presents with  . Follow-up    HPI Elaine Clayton is a 72 y.o. female who presents for a breast evaluation. The most recent mammogram was done on 07/12/2018. Patient has fell a couple of times this year.  Patient does perform regular self breast checks and gets regular mammograms done.    HPI  Past Medical History:  Diagnosis Date  . Anemia   . Ataxia   . Bipolar affective disorder ([place])    2  . Broken foot 2015   right foot  . Chronic gastritis   . Chronic kidney disease    Dr Holley Raring  . COPD (chronic obstructive pulmonary disease) ([place])   . Dizziness    inner ear (per pt) several times per week  . Dysphagia   . Glaucoma   . Hyperlipemia   . Hypertension    pt has recently come off BP meds.  MD aware. BP seems stable.  . Lithium toxicity 2015  . 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 Neck stiffness    s/p C3-C4 fusion, limited right turn  . Osteoarthritis    back  . Pancreatitis   . Parkinson disease ([place])   . Personal history of malignant neoplasm of breast   . Pituitary microadenoma ([place])   . Recurrent UTI   . Renal insufficiency   . Stomach ulcer 2112    Past Surgical History:  Procedure Laterality Date  . APPENDECTOMY    . BREAST BIOPSY Right 2011   neg  . BREAST EXCISIONAL BIOPSY Right 2001   neg  . BREAST EXCISIONAL BIOPSY Right 2008   breast ca 2008 radiation  . BREAST EXCISIONAL BIOPSY Left 10/26/2012   neg  . BREAST SURGERY Left  2013   left breast wide excision,Intraductal papilloma, ductal hyperplasia and sclerosing adenosis. Microcalcifications associated with columnar cell change. No evidence of atypia or malignancy. Margins are unremarkable.  Marland Kitchen BREAST SURGERY Right November 06, 1999   multiple areas of microcalcification showing evidence of sclerosing adenosis and  ductal hyperplasia.  Marland Kitchen BREAST SURGERY Left May 07, 2007   wide excision.  . cataract Right 2013  . CATARACT EXTRACTION W/PHACO Left 03/05/2016   Procedure: CATARACT EXTRACTION PHACO AND INTRAOCULAR LENS PLACEMENT (IOC);  Surgeon: Leandrew Koyanagi, MD;  Location: [place];  Service: Ophthalmology;  Laterality: Left;  . COLONOSCOPY  2004  . COLONOSCOPY N/A 05/14/2015   Procedure: COLONOSCOPY;  Surgeon: Manya Silvas, MD;  Location: [facility] ENDOSCOPY;  Service: Endoscopy;  Laterality: N/A;  . ERCP N/A   . ESOPHAGOGASTRODUODENOSCOPY N/A 05/14/2015   Procedure: ESOPHAGOGASTRODUODENOSCOPY (EGD);  Surgeon: Manya Silvas, MD;  Location: [facility] ENDOSCOPY;  Service: Endoscopy;  Laterality: N/A;  . EUS N/A 2011, 2012  . EYE SURGERY  2013  . GLAUCOMA SURGERY Right 2013  . LAPAROSCOPIC RIGHT COLECTOMY Right 06/22/2015   Procedure: LAPAROSCOPIC RIGHT COLECTOMY;  Surgeon: Robert Bellow, MD;  Location: [facility] ORS;  Service: General;  Laterality: Right;  . NECK SURGERY  2006  . right breast cancer    . SAVORY DILATION N/A 05/14/2015   Procedure: SAVORY DILATION;  Surgeon: Manya Silvas, MD;  Location: [facility] ENDOSCOPY;  Service: Endoscopy;  Laterality: N/A;  . TRABECULECTOMY Left 03/05/2016   Procedure: TRABECULECTOMY WITH [facility] AND EXPRESS SHUNT;  Surgeon: Leandrew Koyanagi, MD;  Location: [place];  Service: Ophthalmology;  Laterality: Left;  . TUBAL LIGATION      Family History  Problem Relation Age of Onset  . Breast cancer Mother 81  . Kidney cancer Neg Hx   . Bladder Cancer Neg Hx     Social History Social History   Tobacco Use  . Smoking status: Former Smoker    Packs/day: 1.00    Years: 35.00    Pack years: 35.00    Last attempt to quit: 12/23/2007    Years since quitting: 10.6  . Smokeless tobacco: Never Used  . Tobacco comment: Uses patch  Substance Use Topics  . Alcohol use: Yes    Alcohol/week: 0.0 oz    Comment: 1 glass wine/mo  . Drug use: No     Allergies  Allergen Reactions  . Influenza Vaccines Anaphylaxis  . Flu Virus Vaccine Other (See Comments)    Muscle spams  . Gabapentin Anxiety  . Sulfa Antibiotics Rash  . Tetracyclines & Related Rash    Current Outpatient Medications  Medication Sig Dispense Refill  . ALPRAZolam (XANAX) 0.5 MG tablet Take 0.5 mg by mouth 2 (two) times daily as needed for sleep. 0.5-1 tablet    . AZOPT 1 % ophthalmic suspension Place 1 drop into the left eye.   0  . bisacodyl (DULCOLAX) 5 MG EC tablet Take 5 mg by mouth daily as needed for moderate constipation.    . Brimonidine Tartrate-Timolol (COMBIGAN OP) Apply 1 drop to eye 2 (two) times daily.    Marland Kitchen buPROPion (WELLBUTRIN SR) 150 MG 12 hr tablet Take 150 mg by mouth every morning. 2 tablets    . dextromethorphan 15 MG/5ML syrup Take 10 mLs by mouth 4 (four) times daily as needed for cough.    . escitalopram (LEXAPRO) 10 MG tablet Take 10 mg by mouth daily.   0  . estradiol-levonorgestrel (CLIMARA PRO) 0.045-0.015 MG/DAY apply 1 patch every week 12 patch 4  . fluticasone furoate-vilanterol (BREO ELLIPTA) 100-25 MCG/INH AEPB Inhale 1 puff into the lungs daily. 3 each 3  . ipratropium (ATROVENT) 0.06 % nasal spray Place 2 sprays into both nostrils 4 (four) times daily as needed for rhinitis.    Marland Kitchen LUMIGAN 0.01 % SOLN apply 1 drop to into both  eyes at bedtime once daily ONLY  0  . mirabegron ER (MYRBETRIQ) 25 MG TB24 tablet Take 1 tablet (25 mg total) by mouth daily. 90 tablet 3  . nicotine polacrilex (NICORETTE) 2 MG gum Take 2 mg by mouth as needed for smoking cessation.    . ondansetron (ZOFRAN) 8 MG tablet Take by mouth every 8 (eight) hours as needed for nausea or vomiting.    . rosuvastatin (CRESTOR) 10 MG tablet Take 10 mg by mouth at bedtime.     . VENTOLIN HFA 108 (90 Base) MCG/ACT inhaler inhale 2 puffs by mouth INTO THE LUNGS every 6 hours if needed for wheezing or shortness of breath 18 g 6  . gabapentin (NEURONTIN) 100 MG capsule Take  1-3 capsules (100-300 mg total) by mouth at bedtime. 90 capsule 0  . HYDROcodone-acetaminophen (NORCO/VICODIN) 5-325 MG tablet Take 1 tablet by mouth every 6 (six) hours as needed for moderate pain. 60 tablet 0   No current facility-administered medications for this visit.     Review of Systems Review of Systems  Blood pressure 118/70, pulse 68, resp. rate 13, height 5\' 4"  (1.626 m), weight 106 lb (48.1 kg).  Physical Exam Physical  Exam  Constitutional: She is oriented to person, place, and time. She appears well-developed and well-nourished.  Cardiovascular: Normal rate, regular rhythm and normal heart sounds.  Pulmonary/Chest: Effort normal and breath sounds normal.    Neurological: She is alert and oriented to person, place, and time.  Skin: Skin is warm and dry.    Data Reviewed 2017 through 2019 mammograms reviewed.  New areas of microcalcifications in the left breast for which stereotactic biopsy is recommended.  BI-RADS-4.  Assessment    Interval development of microcalcifications in the left breast.    Plan   The patient is in the midst of a neurology work-up, and at this time has elected not to proceed directly to biopsy.  The patient and her husband are also moving the next couple of weeks to a retirement community in [place], and they would like to get settled before any surgical procedures or biopsies are completed.  I think the likelihood of a missed invasive cancer is small, and a 6-month follow-up to allow her neurologic status to be clarified as appropriate. She is aware that should she change her mind in the interval she can call and arrangements will be made for her to undergo wire localization open biopsy.  She has stereotactic procedures well in part due to her very petite breast volume has not tolerated.  Patient to return on six months left diagnotic mammogram . The patient is aware to call back for any questions or concerns.   HPI, Physical Exam,  Assessment and Plan have been scribed under the direction and in the presence of Hervey Ard, MD.  Gaspar Cola, CMA  I have completed the exam and reviewed the above documentation for accuracy and completeness.  I agree with the above.  Haematologist has been used and any errors in dictation or transcription are unintentional.  Hervey Ard, M.D., F.A.C.S.   Forest Gleason [place] 07/27/2018, 10:53 PM

## 2018-10-07 ENCOUNTER — Ambulatory Visit (INDEPENDENT_AMBULATORY_CARE_PROVIDER_SITE_OTHER): Payer: Medicare Other | Admitting: Nurse Practitioner

## 2018-10-07 ENCOUNTER — Encounter: Payer: Self-pay | Admitting: Nurse Practitioner

## 2018-10-07 VITALS — BP 132/78 | HR 112 | Temp 97.4°F | Ht 64.0 in | Wt 102.0 lb

## 2018-10-07 DIAGNOSIS — J209 Acute bronchitis, unspecified: Secondary | ICD-10-CM | POA: Insufficient documentation

## 2018-10-07 DIAGNOSIS — J440 Chronic obstructive pulmonary disease with acute lower respiratory infection: Secondary | ICD-10-CM

## 2018-10-07 DIAGNOSIS — J449 Chronic obstructive pulmonary disease, unspecified: Secondary | ICD-10-CM | POA: Diagnosis not present

## 2018-10-07 MED ORDER — BENZONATATE 100 MG PO CAPS: 100.0000 mg | ORAL_CAPSULE | Freq: Three times a day (TID) | ORAL | 1 refills | Status: DC

## 2018-10-07 MED ORDER — IPRATROPIUM-ALBUTEROL 0.5-2.5 (3) MG/3ML IN SOLN
3.0000 mL | RESPIRATORY_TRACT | Status: AC
  Administered 2018-10-07: 3 mL via RESPIRATORY_TRACT

## 2018-10-07 MED ORDER — AZITHROMYCIN 250 MG PO TABS: ORAL_TABLET | ORAL | 0 refills | Status: DC

## 2018-10-07 MED ORDER — PREDNISONE 10 MG PO TABS: ORAL_TABLET | ORAL | 0 refills | Status: DC

## 2018-10-07 NOTE — Assessment & Plan Note (Signed)
Patient Instructions  Will order azithromycin Will order prednisone taper Will order tessalon pearls Continue Breo and albuterol at home duoneb given in office today Patient would like to transfer care to our office since she has recently moved. Please schedule appointment in around 1 month to establish care with Dr. Loanne Drilling. Please call sooner if symptoms worsen or go to the ED

## 2018-10-07 NOTE — Progress Notes (Signed)
@Patient  ID: [name], female    DOB: [birth year], 72 y.o.   MRN: [id number]  Chief Complaint  Patient presents with  . COPD    Wheezing, coughing, SOB.    Referring provider: Idelle Crouch, MD  HPI 72 year old female former smoker with COPD followed by Dr. Alva Garnet.  Tests:  PFT Results Latest Ref Rng & Units 10/17/2015  FVC-Pre L 2.18  FVC-Predicted Pre % 69  FVC-Post L 2.31  FVC-Predicted Post % 73  Pre FEV1/FVC % % 58  Post FEV1/FCV % % 59  FEV1-Pre L 1.26  FEV1-Predicted Pre % 52  DLCO UNC% % 38  DLCO COR %Predicted % 46  TLC L 6.17  TLC % Predicted % 120  RV % Predicted % 177    OV 10/07/18 - cough and wheezing Patient presents today with cough and wheeze. Symptoms started 2 days ago and have progressively worsened. Patient was very short of breath this morning with cough. She has been compliant with Breo and ventolin HFA. She has recently moved here to be closer to her son and would like to switch care to our office. She was previously seeing Dr. Alva Garnet in [place]. She denies any fever, chest pain, or edema.          Allergies  Allergen Reactions  . Influenza Vaccines Anaphylaxis  . Flu Virus Vaccine Other (See Comments)    Muscle spams  . Gabapentin Anxiety  . Sulfa Antibiotics Rash  . Tetracyclines & Related Rash    Immunization History  Administered Date(s) Administered  . BCG 07/01/2018  . PPD Test 06/28/2018  . Tdap 10/04/2014    Past Medical History:  Diagnosis Date  . Anemia   . Ataxia   . Bipolar affective disorder ([place])    2  . Broken foot 2015   right foot  . Chronic gastritis   . Chronic kidney disease    Dr Holley Raring  . COPD (chronic obstructive pulmonary disease) ([place])   . Dizziness    inner ear (per pt) several times per week  . Dysphagia   . Glaucoma   . Hyperlipemia   . Hypertension    pt has recently come off BP meds.  MD aware. BP seems stable.  . Lithium toxicity 2015  . Malignant neoplasm of  upper-outer quadrant of female breast [facility]) May 07, 2007   tubular carcinoma, 1 mm, T1a,Nx  . Neck stiffness    s/p C3-C4 fusion, limited right turn  . Osteoarthritis    back  . Pancreatitis   . Parkinson disease ([place])   . Personal history of malignant neoplasm of breast   . Pituitary microadenoma ([place])   . Recurrent UTI   . Renal insufficiency   . Stomach ulcer 2112    Tobacco History: Social History   Tobacco Use  Smoking Status Former Smoker  . Packs/day: 1.00  . Years: 35.00  . Pack years: 35.00  . Last attempt to quit: 12/23/2007  . Years since quitting: 10.7  Smokeless Tobacco Never Used  Tobacco Comment   Uses patch   Counseling given: Yes Comment: Uses patch   Outpatient Encounter Medications as of 10/07/2018  Medication Sig  . ALPRAZolam (XANAX) 0.5 MG tablet Take 0.5 mg by mouth 2 (two) times daily as needed for sleep. 0.5-1 tablet  . AZOPT 1 % ophthalmic suspension Place 1 drop into the left eye.   . bisacodyl (DULCOLAX) 5 MG EC tablet Take 5 mg by mouth daily as  needed for moderate constipation.  . Brimonidine Tartrate-Timolol (COMBIGAN OP) Apply 1 drop to eye 2 (two) times daily.  Marland Kitchen buPROPion (WELLBUTRIN SR) 150 MG 12 hr tablet Take 150 mg by mouth every morning. 2 tablets  . dextromethorphan 15 MG/5ML syrup Take 10 mLs by mouth 4 (four) times daily as needed for cough.  . escitalopram (LEXAPRO) 10 MG tablet Take 10 mg by mouth daily. Patient takes 1.5 tablets daily.  Marland Kitchen estradiol-levonorgestrel (CLIMARA PRO) 0.045-0.015 MG/DAY apply 1 patch every week  . fluticasone furoate-vilanterol (BREO ELLIPTA) 100-25 MCG/INH AEPB Inhale 1 puff into the lungs daily.  Marland Kitchen ipratropium (ATROVENT) 0.06 % nasal spray Place 2 sprays into both nostrils 4 (four) times daily as needed for rhinitis.  Marland Kitchen LUMIGAN 0.01 % SOLN apply 1 drop to into both  eyes at bedtime once daily ONLY  . mirabegron ER (MYRBETRIQ) 25 MG TB24 tablet Take 1 tablet (25 mg total) by mouth daily.  . nicotine  polacrilex (NICORETTE) 2 MG gum Take 2 mg by mouth as needed for smoking cessation.  . ondansetron (ZOFRAN) 8 MG tablet Take by mouth every 8 (eight) hours as needed for nausea or vomiting.  . rosuvastatin (CRESTOR) 10 MG tablet Take 10 mg by mouth at bedtime.   . VENTOLIN HFA 108 (90 Base) MCG/ACT inhaler inhale 2 puffs by mouth INTO THE LUNGS every 6 hours if needed for wheezing or shortness of breath  . azithromycin (ZITHROMAX) 250 MG tablet Take 2 tablets (500 mg) on day 1, then take 1 tablet (250 mg) on days 2-5  . benzonatate (TESSALON) 100 MG capsule Take 1 capsule (100 mg total) by mouth 3 (three) times daily.  Marland Kitchen gabapentin (NEURONTIN) 100 MG capsule Take 1-3 capsules (100-300 mg total) by mouth at bedtime.  Marland Kitchen HYDROcodone-acetaminophen (NORCO/VICODIN) 5-325 MG tablet Take 1 tablet by mouth every 6 (six) hours as needed for moderate pain.  . predniSONE (DELTASONE) 10 MG tablet Take 3 tabs for 2 days, then 2 tabs for 2 days, then 1 tab for 2 days, then stop  . [DISCONTINUED] azithromycin (ZITHROMAX) 250 MG tablet Take 2 tablets (500 mg) on day 1, then take 1 tablet (250 mg) on days 2-5  . [DISCONTINUED] benzonatate (TESSALON) 100 MG capsule Take 1 capsule (100 mg total) by mouth 3 (three) times daily.  . [DISCONTINUED] predniSONE (DELTASONE) 10 MG tablet Take 3 tabs for 2 days, then 2 tabs for 2 days, then 1 tab for 2 days, then stop  . [EXPIRED] ipratropium-albuterol (DUONEB) 0.5-2.5 (3) MG/3ML nebulizer solution 3 mL    No facility-administered encounter medications on file as of 10/07/2018.      Review of Systems  Review of Systems  Constitutional: Negative.  Negative for chills and fever.  HENT: Negative.  Negative for congestion, sinus pressure and sinus pain.   Respiratory: Positive for cough, shortness of breath and wheezing.   Cardiovascular: Negative.  Negative for chest pain, palpitations and leg swelling.  Gastrointestinal: Negative.   Allergic/Immunologic: Negative.     Neurological: Negative.   Psychiatric/Behavioral: Negative.        Physical Exam  BP 132/78 (BP Location: Left Arm, Patient Position: Sitting, Cuff Size: Normal)   Pulse (!) 112   Temp (!) 97.4 F (36.3 C)   Ht 5\' 4"  (1.626 m)   Wt 102 lb (46.3 kg)   SpO2 94%   BMI 17.51 kg/m   Wt Readings from Last 5 Encounters:  10/07/18 102 lb (46.3 kg)  07/27/18 106 lb (48.1  kg)  05/24/18 104 lb (47.2 kg)  05/20/18 104 lb (47.2 kg)  05/10/18 104 lb (47.2 kg)     Physical Exam  Constitutional: She is oriented to person, place, and time. She appears well-developed and well-nourished. No distress.  Cardiovascular: Normal rate and regular rhythm.  Pulmonary/Chest: Effort normal. No respiratory distress. She has wheezes.  Harsh cough noted in office today  Musculoskeletal: She exhibits no edema.  Neurological: She is alert and oriented to person, place, and time.  Psychiatric: She has a normal mood and affect.  Nursing note and vitals reviewed.     Assessment & Plan:   Acute bronchitis with COPD [facility]) Patient Instructions  Will order azithromycin Will order prednisone taper Will order tessalon pearls Continue Breo and albuterol at home duoneb given in office today Patient would like to transfer care to our office since she has recently moved. Please schedule appointment in around 1 month to establish care with Dr. Loanne Drilling. Please call sooner if symptoms worsen or go to the ED       Fenton Foy, NP 10/07/2018

## 2018-10-07 NOTE — Patient Instructions (Addendum)
Will order azithromycin Will order prednisone taper Will order tessalon pearls Continue Breo and albuterol at home duoneb given in office today Patient would like to transfer care to our office since she has recently moved. Please schedule appointment in around 1 month to establish care with Dr. Loanne Drilling. Please call sooner if symptoms worsen or go to the ED

## 2018-10-11 ENCOUNTER — Telehealth: Payer: Self-pay | Admitting: Nurse Practitioner

## 2018-10-11 DIAGNOSIS — J449 Chronic obstructive pulmonary disease, unspecified: Secondary | ICD-10-CM

## 2018-10-11 NOTE — Telephone Encounter (Signed)
Left message for patient to call back  

## 2018-10-12 MED ORDER — ALBUTEROL SULFATE (2.5 MG/3ML) 0.083% IN NEBU: 2.5000 mg | INHALATION_SOLUTION | Freq: Four times a day (QID) | RESPIRATORY_TRACT | 12 refills | Status: DC | PRN

## 2018-10-12 MED ORDER — MOXIFLOXACIN HCL 400 MG PO TABS: 400.0000 mg | ORAL_TABLET | Freq: Every day | ORAL | 0 refills | Status: AC

## 2018-10-12 MED ORDER — ALBUTEROL SULFATE HFA 108 (90 BASE) MCG/ACT IN AERS: 2.0000 | INHALATION_SPRAY | Freq: Four times a day (QID) | RESPIRATORY_TRACT | 6 refills | Status: DC | PRN

## 2018-10-12 NOTE — Telephone Encounter (Addendum)
Spoke to pt's spouse, Delfino Lovett (DPR). Richard is requesting refill on albuterol HFA. Pt was prescribed zpak and prednisone at 10/07/18 OV. Richard is requesting to extend zpak, as pt sx have improved but have not subsided.  Pt reports of prod cough with clear mucus, mild wheezing, chest congestion & sob with exertion.  Denied fever, chills or sweats Pt taken tessalon q8h with some improvement.  Delfino Lovett is wanting to know if albuterol neb would be an option for pt. If so, pt will need Rx for neb machine as well.  Preferred pharmacy is [facility].  Offered apt, Delfino Lovett is requesting recommendations prior to scheduling. Rx for albuterol HFA has been sent to preferred phramcy.  Tonya please advise.

## 2018-10-12 NOTE — Telephone Encounter (Signed)
Pt's spouse, Delfino Lovett [facility]) is aware of recommendations and voiced his understanding.  DME order has been placed to [facility] for neb machine.  Richard stated he would speak with pt regarding apt, and would have pt contact our office to schedule.  Nothing further is needed.

## 2018-10-12 NOTE — Telephone Encounter (Signed)
I ordered avelox x 5 days. She can start that tomorrow. I also ordered albuterol solution for neb. Could you please order the machine. Thanks. She probably needs a follow up appt at the end of this week or early next week.

## 2018-11-08 ENCOUNTER — Other Ambulatory Visit: Payer: Self-pay

## 2018-11-08 DIAGNOSIS — R920 Mammographic microcalcification found on diagnostic imaging of breast: Secondary | ICD-10-CM

## 2018-11-12 ENCOUNTER — Ambulatory Visit: Payer: Medicare Other | Admitting: Pulmonary Disease

## 2018-11-12 ENCOUNTER — Encounter: Payer: Self-pay | Admitting: Pulmonary Disease

## 2018-11-12 VITALS — BP 116/60 | HR 98 | Ht 64.0 in | Wt 98.4 lb

## 2018-11-12 DIAGNOSIS — J449 Chronic obstructive pulmonary disease, unspecified: Secondary | ICD-10-CM

## 2018-11-12 MED ORDER — FLUTICASONE FUROATE-VILANTEROL 100-25 MCG/INH IN AEPB: 1.0000 | INHALATION_SPRAY | Freq: Every day | RESPIRATORY_TRACT | 3 refills | Status: DC

## 2018-11-12 MED ORDER — ALBUTEROL SULFATE HFA 108 (90 BASE) MCG/ACT IN AERS: 2.0000 | INHALATION_SPRAY | Freq: Four times a day (QID) | RESPIRATORY_TRACT | 3 refills | Status: DC | PRN

## 2018-11-12 MED ORDER — TIOTROPIUM BROMIDE MONOHYDRATE 2.5 MCG/ACT IN AERS: 2.0000 | INHALATION_SPRAY | Freq: Every day | RESPIRATORY_TRACT | 4 refills | Status: DC

## 2018-11-12 MED ORDER — BENZONATATE 100 MG PO CAPS: 100.0000 mg | ORAL_CAPSULE | Freq: Three times a day (TID) | ORAL | 1 refills | Status: DC

## 2018-11-12 NOTE — Patient Instructions (Addendum)
COPD, moderately severe (FEV1 57%) --START Spiriva Respimat 2.5 mcg 2 inhalations once a day --CONTINUE Breo 100-25 mcg 1 puff daily --CONTINUE Albuterol inhaler 2 puffs every 4 hours as needed for shortness of breath or wheezing --Ordered Tessalon Per[place] as needed for cough --Will place Lung Cancer Screen Referral  Follow-up with me in 3 months   Chronic Obstructive Pulmonary Disease Chronic obstructive pulmonary disease (COPD) is a long-term (chronic) lung problem. When you have COPD, it is hard for air to get in and out of your lungs. The way your lungs work will never return to normal. Usually the condition gets worse over time. There are things you can do to keep yourself as healthy as possible. Your doctor may treat your condition with:  Medicines.  Quitting smoking, if you smoke.  Rehabilitation. This may involve a team of specialists.  Oxygen.  Exercise and changes to your diet.  Lung surgery.  Comfort measures (palliative care).  Follow these instructions at home: Medicines  Take over-the-counter and prescription medicines only as told by your doctor.  Talk to your doctor before taking any cough or allergy medicines. You may need to avoid medicines that cause your lungs to be dry. Lifestyle  If you smoke, stop. Smoking makes the problem worse. If you need help quitting, ask your doctor.  Avoid being around things that make your breathing worse. This may include smoke, chemicals, and fumes.  Stay active, but remember to also rest.  Learn and use tips on how to relax.  Make sure you get enough sleep. Most adults need at least 7 hours a night.  Eat healthy foods. Eat smaller meals more often. Rest before meals. Controlled breathing  Learn and use tips on how to control your breathing as told by your doctor. Try: ? Breathing in (inhaling) through your nose for 1 second. Then, pucker your lips and breath out (exhale) through your lips for 2 seconds. ? Putting one  hand on your belly (abdomen). Breathe in slowly through your nose for 1 second. Your hand on your belly should move out. Pucker your lips and breathe out slowly through your lips. Your hand on your belly should move in as you breathe out. Controlled coughing  Learn and use controlled coughing to clear mucus from your lungs. The steps are: 1. Lean your head a little forward. 2. Breathe in deeply. 3. Try to hold your breath for 3 seconds. 4. Keep your mouth slightly open while coughing 2 times. 5. Spit any mucus out into a tissue. 6. Rest and do the steps again 1 or 2 times as needed. General instructions  Make sure you get all the shots (vaccines) that your doctor recommends. Ask your doctor about a flu shot and a pneumonia shot.  Use oxygen therapy and therapy to help improve your lungs (pulmonary rehabilitation) if told by your doctor. If you need home oxygen therapy, ask your doctor if you should buy a tool to measure your oxygen level (oximeter).  Make a COPD action plan with your doctor. This helps you know what to do if you feel worse than usual.  Manage any other conditions you have as told by your doctor.  Avoid going outside when it is very hot, cold, or humid.  Avoid people who have a sickness you can catch (contagious).  Keep all follow-up visits as told by your doctor. This is important. Contact a doctor if:  You cough up more mucus than usual.  There is a change in  the color or thickness of the mucus.  It is harder to breathe than usual.  Your breathing is faster than usual.  You have trouble sleeping.  You need to use your medicines more often than usual.  You have trouble doing your normal activities such as getting dressed or walking around the house. Get help right away if:  You have shortness of breath while resting.  You have shortness of breath that stops you from: ? Being able to talk. ? Doing normal activities.  Your chest hurts for longer than 5  minutes.  Your skin color is more blue than usual.  Your pulse oximeter shows that you have low oxygen for longer than 5 minutes.  You have a fever.  You feel too tired to breathe normally. Summary  Chronic obstructive pulmonary disease (COPD) is a long-term lung problem.  The way your lungs work will never return to normal. Usually the condition gets worse over time. There are things you can do to keep yourself as healthy as possible.  Take over-the-counter and prescription medicines only as told by your doctor.  If you smoke, stop. Smoking makes the problem worse. This information is not intended to replace advice given to you by your health care provider. Make sure you discuss any questions you have with your health care provider. Document Released: 05/26/2008 Document Revised: 05/15/2016 Document Reviewed: 08/04/2013 Elsevier Interactive Patient Education  2017 [employer].

## 2018-11-12 NOTE — Progress Notes (Signed)
Synopsis: Referred in 10/2018 for COPD  Subjective:   PATIENT ID: Elaine Clayton GENDER: female DOB: 1946/05/29, MRN: [id number]   HPI  Chief Complaint  Patient presents with  . Follow-up    fatigue and some SOB in afternoon- cough is much less but still lingers.   [name] is a 72 year old female for management of her COPD.  Last seen by Lazaro Arms, NP for COPD exacerbation.  Treated with prednisone taper, Z-Pak and Tessalon Perles.  Since then, she reports her shortness of breath, cough and wheezing have improved.  She does not have frequent COPD exacerbations and it has been over a year since she was treated with antibiotics or steroids.  Denies any ED/urgent care visits. Compliant with Breo and Ventolin, which she feels stabilizes her symptoms.  However continues to report persistent cough and shortness of breath.  Associated symptoms include fatigue.  Denies fevers, chills.  No unintentional weight loss.  She attends physical therapy sessions her activity.  Former smoker. 35 pack-years.  Quit in 2009. Environmental exposures: None Retired Marine scientist.  Husband previously worked as a Advice worker.  I have personally reviewed patient's past medical/family/social history, allergies, current medications.  Past Medical History:  Diagnosis Date  . Anemia   . Ataxia   . Bipolar affective disorder ([place])    2  . Broken foot 2015   right foot  . Chronic gastritis   . Chronic kidney disease    Dr Holley Raring  . COPD (chronic obstructive pulmonary disease) ([name])   . Dizziness    inner ear (per pt) several times per week  . Dysphagia   . Glaucoma   . Hyperlipemia   . Hypertension    pt has recently come off BP meds.  MD aware. BP seems stable.  . Lithium toxicity 2015  . 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 Neck stiffness    s/p C3-C4 fusion, limited right turn  . Osteoarthritis    back  . Pancreatitis   . Parkinson disease ([place])   . Personal history of malignant neoplasm of breast   . Pituitary microadenoma ([place])   . Recurrent UTI   . Renal insufficiency   . Stomach ulcer 2112     Family History  Problem Relation Age of Onset  . Breast cancer Mother 64  . Kidney cancer Neg Hx   . Bladder Cancer Neg Hx      Social History   Occupational History  . Occupation: Retired  Tobacco Use  . Smoking status: Former Smoker    Packs/day: 1.00    Years: 35.00    Pack years: 35.00    Last attempt to quit: 12/23/2007    Years since quitting: 10.8  . Smokeless tobacco: Never Used  . Tobacco comment: Uses patch  Substance and Sexual Activity  . Alcohol use: Yes    Alcohol/week: 0.0 standard drinks    Comment: 1 glass wine/mo  . Drug use: No  . Sexual activity: Not on file    Allergies  Allergen Reactions  . Influenza Vaccines Anaphylaxis  . Flu Virus Vaccine Other (See Comments)    Muscle spams  . Gabapentin Anxiety  . Sulfa Antibiotics Rash  . Tetracyclines & Related Rash     Outpatient Medications Prior to Visit  Medication Sig Dispense Refill  . albuterol (PROVENTIL) (2.5 MG/3ML) 0.083% nebulizer solution Take 3 mLs (2.5 mg total) by nebulization  every 6 (six) hours as needed for wheezing or shortness of breath. 75 mL 12  . albuterol (VENTOLIN HFA) 108 (90 Base) MCG/ACT inhaler Inhale 2 puffs into the lungs every 6 (six) hours as needed for wheezing or shortness of breath. 18 g 6  . ALPRAZolam (XANAX) 0.5 MG tablet Take 0.5 mg by mouth 2 (two) times daily as needed for sleep. 0.5-1 tablet    . AZOPT 1 % ophthalmic suspension Place 1 drop into the left eye.   0  . bisacodyl (DULCOLAX) 5 MG EC tablet Take 5 mg by mouth daily as needed for moderate constipation.    . Brimonidine Tartrate-Timolol (COMBIGAN OP) Apply 1 drop to eye 2 (two) times daily.    Marland Kitchen buPROPion (WELLBUTRIN SR) 150 MG 12 hr tablet Take 150 mg by mouth every morning. 2 tablets    .  dextromethorphan 15 MG/5ML syrup Take 10 mLs by mouth 4 (four) times daily as needed for cough.    . escitalopram (LEXAPRO) 10 MG tablet Take 10 mg by mouth daily. Patient takes 1.5 tablets daily.  0  . estradiol-levonorgestrel (CLIMARA PRO) 0.045-0.015 MG/DAY apply 1 patch every week 12 patch 4  . ipratropium (ATROVENT) 0.06 % nasal spray Place 2 sprays into both nostrils 4 (four) times daily as needed for rhinitis.    Marland Kitchen LUMIGAN 0.01 % SOLN apply 1 drop to into both  eyes at bedtime once daily ONLY  0  . mirabegron ER (MYRBETRIQ) 25 MG TB24 tablet Take 1 tablet (25 mg total) by mouth daily. 90 tablet 3  . nicotine polacrilex (NICORETTE) 2 MG gum Take 2 mg by mouth as needed for smoking cessation.    . ondansetron (ZOFRAN) 8 MG tablet Take by mouth every 8 (eight) hours as needed for nausea or vomiting.    . rosuvastatin (CRESTOR) 10 MG tablet Take 10 mg by mouth at bedtime.     Marland Kitchen azithromycin (ZITHROMAX) 250 MG tablet Take 2 tablets (500 mg) on day 1, then take 1 tablet (250 mg) on days 2-5 6 tablet 0  . benzonatate (TESSALON) 100 MG capsule Take 1 capsule (100 mg total) by mouth 3 (three) times daily. 30 capsule 1  . fluticasone furoate-vilanterol (BREO ELLIPTA) 100-25 MCG/INH AEPB Inhale 1 puff into the lungs daily. 3 each 3  . predniSONE (DELTASONE) 10 MG tablet Take 3 tabs for 2 days, then 2 tabs for 2 days, then 1 tab for 2 days, then stop 12 tablet 0  . gabapentin (NEURONTIN) 100 MG capsule Take 1-3 capsules (100-300 mg total) by mouth at bedtime. 90 capsule 0  . HYDROcodone-acetaminophen (NORCO/VICODIN) 5-325 MG tablet Take 1 tablet by mouth every 6 (six) hours as needed for moderate pain. 60 tablet 0   No facility-administered medications prior to visit.     ROS   Objective:  Physical Exam  Constitutional: She is oriented to person, place, and time. No distress.  Thin, frail appearing female  HENT:  Head: Normocephalic and atraumatic.  Nose: Nose normal.  Mouth/Throat: No  oropharyngeal exudate.  Eyes: Conjunctivae and EOM are normal. No scleral icterus.  Neck: Normal range of motion. Neck supple. No JVD present. No tracheal deviation present.  Cardiovascular: Normal rate, regular rhythm, normal heart sounds and intact distal pulses. Exam reveals no gallop and no friction rub.  No murmur heard. Pulmonary/Chest: Effort normal. No stridor. No respiratory distress. She has no wheezes. She has no rales.  Decreased breath sounds bilaterally  Abdominal: Soft. Bowel sounds are  normal. She exhibits no distension. There is no tenderness.  Musculoskeletal: Normal range of motion. She exhibits no edema or deformity.  Neurological: She is alert and oriented to person, place, and time. No cranial nerve deficit or sensory deficit.  Skin: Skin is warm and dry. No rash noted. She is not diaphoretic. No erythema. No pallor.  Psychiatric: She has a normal mood and affect. Her behavior is normal. Judgment and thought content normal.  Vitals reviewed.    Vitals:   11/12/18 1138  BP: 116/60  Pulse: 98  SpO2: 98%  Weight: 98 lb 6.4 oz (44.6 kg)  Height: 5\' 4"  (1.626 m)   SpO2: 98 % O2 Device: None (Room air)  Chest imaging: CXR 10/25/15 - Hyperinflated lung fields. No airspace disease.  PFT: 10/16/18 - Ratio 58 FEV1 1.37L (57%) FVC 2.31L (73%)  Moderately severe obstructive defect with air trapping and severely decreased DLCO consistent with emphysema  I have personally reviewed the above labs, images and tests noted above.    Assessment & Plan:   72 year old female with moderately severe COPD.  Gold class B.  COPD, moderately severe (FEV1 57%) --START Spiriva Respimat 2.5 mcg 2 inhalations once a day --CONTINUE Breo 100-25 mcg 1 puff daily.  Refilled. --CONTINUE Albuterol inhaler 2 puffs every 4 hours as needed for shortness of breath or wheezing --Ordered Tessalon Perles as needed for cough --Will place Lung Cancer Screen Referral  Follow-up with me in 3  months   Meds ordered this encounter  Medications  . benzonatate (TESSALON) 100 MG capsule    Sig: Take 1 capsule (100 mg total) by mouth 3 (three) times daily.    Dispense:  30 capsule    Refill:  1  . fluticasone furoate-vilanterol (BREO ELLIPTA) 100-25 MCG/INH AEPB    Sig: Inhale 1 puff into the lungs daily.    Dispense:  3 each    Refill:  3    Return in about 3 months (around 02/12/2019).   Thank you for choosing [facility] for your health needs!   Eon Zunker Rodman Pickle, MD [facility] 11/12/2018 12:11 PM  Personal pager: [phone] If unanswered, please page CCM On-call: [phone]

## 2019-01-03 ENCOUNTER — Telehealth: Payer: Self-pay | Admitting: *Deleted

## 2019-01-03 NOTE — Telephone Encounter (Signed)
Patient to see Dr. Bary Castilla only .

## 2019-01-14 ENCOUNTER — Ambulatory Visit
Admission: RE | Admit: 2019-01-14 | Discharge: 2019-01-14 | Disposition: A | Discharge status: Home / Self Care | Payer: Medicare Other | Source: Ambulatory Visit | Attending: General Surgery | Admitting: General Surgery

## 2019-01-14 DIAGNOSIS — R920 Mammographic microcalcification found on diagnostic imaging of breast: Secondary | ICD-10-CM | POA: Insufficient documentation

## 2019-01-18 ENCOUNTER — Other Ambulatory Visit: Payer: Self-pay

## 2019-01-18 ENCOUNTER — Encounter: Payer: Self-pay | Admitting: General Surgery

## 2019-01-18 ENCOUNTER — Ambulatory Visit (INDEPENDENT_AMBULATORY_CARE_PROVIDER_SITE_OTHER): Payer: Medicare Other | Admitting: General Surgery

## 2019-01-18 ENCOUNTER — Ambulatory Visit: Payer: Medicare Other | Admitting: General Surgery

## 2019-01-18 ENCOUNTER — Other Ambulatory Visit: Payer: Self-pay | Admitting: General Surgery

## 2019-01-18 VITALS — BP 118/68 | HR 86 | Resp 12 | Ht 64.0 in | Wt 98.6 lb

## 2019-01-18 DIAGNOSIS — R920 Mammographic microcalcification found on diagnostic imaging of breast: Secondary | ICD-10-CM

## 2019-01-18 DIAGNOSIS — Z853 Personal history of malignant neoplasm of breast: Secondary | ICD-10-CM

## 2019-01-18 NOTE — Patient Instructions (Addendum)
Recommend Stereotactic core needle biopsy of the left breast.   The patient is aware to call back for any questions or new concerns.

## 2019-01-18 NOTE — Progress Notes (Signed)
Patient ID: Elaine Clayton, female   DOB: 06-06-1946, 73 y.o.   MRN: [id number]  Chief Complaint  Patient presents with  . Follow-up    6 mo f/u rec [facility] 01/14/19    HPI Elaine Clayton is a 73 y.o. female.  who presents for her follow up right breast cancer and a breast evaluation. The most recent left mammogram was done on 01-14-19.  Patient does perform regular self breast checks and gets regular mammograms done.   No new breast issues.   The patient is being followed at [facility] for Parkinson-like symptoms and apraxia.  She is no longer driving.  She is frustrated that her husband sold her car to her son-in-law.  They have enjoyed their move to a senior living facility in [place] completed last year.  HPI  Past Medical History:  Diagnosis Date  . Anemia   . Ataxia    Apaxia  . Bipolar affective disorder ([place])    2  . Broken foot 2015   right foot  . Chronic gastritis   . Chronic kidney disease    Dr Holley Raring  . COPD (chronic obstructive pulmonary disease) ([place])   . Dizziness    inner ear (per pt) several times per week  . Dysphagia   . Glaucoma   . Hyperlipemia   . Hypertension    pt has recently come off BP meds.  MD aware. BP seems stable.  . Lithium toxicity 2015  . 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 Neck stiffness    s/p C3-C4 fusion, limited right turn  . Osteoarthritis    back  . Pancreatitis   . Parkinson disease ([place])   . Personal history of malignant neoplasm of breast   . Pituitary microadenoma ([place])   . Recurrent UTI   . Renal insufficiency   . Stomach ulcer 2112    Past Surgical History:  Procedure Laterality Date  . APPENDECTOMY    . BREAST BIOPSY Right 2011   neg  . BREAST EXCISIONAL BIOPSY Right 2001   neg  . BREAST EXCISIONAL BIOPSY Right 2008   breast ca 2008 radiation  . BREAST EXCISIONAL BIOPSY Left 10/26/2012   neg  . BREAST SURGERY Left   2013   left breast wide excision,Intraductal papilloma, ductal hyperplasia and sclerosing adenosis. Microcalcifications associated with columnar cell change. No evidence of atypia or malignancy. Margins are unremarkable.  Marland Kitchen BREAST SURGERY Right November 06, 1999   multiple areas of microcalcification showing evidence of sclerosing adenosis and ductal hyperplasia.  Marland Kitchen BREAST SURGERY Left May 07, 2007   wide excision.  . cataract Right 2013  . CATARACT EXTRACTION W/PHACO Left 03/05/2016   Procedure: CATARACT EXTRACTION PHACO AND INTRAOCULAR LENS PLACEMENT (IOC);  Surgeon: Leandrew Koyanagi, MD;  Location: [facility];  Service: Ophthalmology;  Laterality: Left;  . COLONOSCOPY  2004  . COLONOSCOPY N/A 05/14/2015   Procedure: COLONOSCOPY;  Surgeon: Manya Silvas, MD;  Location: [facility] ENDOSCOPY;  Service: Endoscopy;  Laterality: N/A;  . ERCP N/A   . ESOPHAGOGASTRODUODENOSCOPY N/A 05/14/2015   Procedure: ESOPHAGOGASTRODUODENOSCOPY (EGD);  Surgeon: Manya Silvas, MD;  Location[facility] ENDOSCOPY;  Service: Endoscopy;  Laterality: N/A;  . EUS N/A 2011, 2012  . EYE SURGERY  2013  . GLAUCOMA SURGERY Right 2013  . LAPAROSCOPIC RIGHT COLECTOMY Right 06/22/2015   LAPAROSCOPIC RIGHT COLECTOMY;  Surgeon: Robert Bellow, MD;  Location: [facility] ORS; 1.6 cm tubular adenoma.  Marland Kitchen NECK SURGERY  2006  . right breast cancer    . SAVORY DILATION N/A 05/14/2015   Procedure: SAVORY DILATION;  Surgeon: Manya Silvas, MD;  Location: [facility] ENDOSCOPY;  Service: Endoscopy;  Laterality: N/A;  . TRABECULECTOMY Left 03/05/2016   Procedure: TRABECULECTOMY WITH [facility] AND EXPRESS SHUNT;  Surgeon: Leandrew Koyanagi, MD;  Location: [facility];  Service: Ophthalmology;  Laterality: Left;  . TUBAL LIGATION      Family History  Problem Relation Age of Onset  . Breast cancer Mother 28  . Kidney cancer Neg Hx   . Bladder Cancer Neg Hx     Social History Social History   Tobacco Use  . Smoking status:  Former Smoker    Packs/day: 1.00    Years: 35.00    Pack years: 35.00    Last attempt to quit: 12/23/2007    Years since quitting: 11.0  . Smokeless tobacco: Never Used  . Tobacco comment: Uses patch  Substance Use Topics  . Alcohol use: Yes    Alcohol/week: 0.0 standard drinks    Comment: 1 glass wine/mo  . Drug use: No    Allergies  Allergen Reactions  . Influenza Vaccines Anaphylaxis  . Flu Virus Vaccine Other (See Comments)    Muscle spams  . Gabapentin Anxiety  . Sulfa Antibiotics Rash  . Tetracyclines & Related Rash    Current Outpatient Medications  Medication Sig Dispense Refill  . albuterol (PROVENTIL) (2.5 MG/3ML) 0.083% nebulizer solution Take 3 mLs (2.5 mg total) by nebulization every 6 (six) hours as needed for wheezing or shortness of breath. 75 mL 12  . albuterol (VENTOLIN HFA) 108 (90 Base) MCG/ACT inhaler Inhale 2 puffs into the lungs every 6 (six) hours as needed for wheezing or shortness of breath. 1 Inhaler 3  . ALPRAZolam (XANAX) 0.5 MG tablet Take 0.5 mg by mouth 2 (two) times daily as needed for sleep. 0.5-1 tablet    . AZOPT 1 % ophthalmic suspension Place 1 drop into the left eye.   0  . benzonatate (TESSALON) 100 MG capsule Take 1 capsule (100 mg total) by mouth 3 (three) times daily. 30 capsule 1  . bisacodyl (DULCOLAX) 5 MG EC tablet Take 5 mg by mouth daily as needed for moderate constipation.    Marland Kitchen buPROPion (WELLBUTRIN SR) 150 MG 12 hr tablet Take 150 mg by mouth every morning. 2 tablets    . dextromethorphan 15 MG/5ML syrup Take 10 mLs by mouth 4 (four) times daily as needed for cough.    . escitalopram (LEXAPRO) 10 MG tablet Take 10 mg by mouth daily. Patient takes 1.5 tablets daily.  0  . estradiol-levonorgestrel (CLIMARA PRO) 0.045-0.015 MG/DAY apply 1 patch every week 12 patch 4  . fluticasone furoate-vilanterol (BREO ELLIPTA) 100-25 MCG/INH AEPB Inhale 1 puff into the lungs daily. 3 each 3  . ipratropium (ATROVENT) 0.06 % nasal spray Place 2  sprays into both nostrils 4 (four) times daily as needed for rhinitis.    Marland Kitchen LUMIGAN 0.01 % SOLN apply 1 drop to into both  eyes at bedtime once daily ONLY  0  . mirabegron ER (MYRBETRIQ) 25 MG TB24 tablet Take 1 tablet (25 mg total) by mouth daily. 90 tablet 3  . nicotine polacrilex (NICORETTE) 2 MG gum Take 2 mg by mouth as needed for smoking cessation.    . ondansetron (ZOFRAN) 8 MG tablet Take by mouth every 8 (eight) hours as needed  for nausea or vomiting.    . rosuvastatin (CRESTOR) 10 MG tablet Take 10 mg by mouth at bedtime.     . Tiotropium Bromide Monohydrate (SPIRIVA RESPIMAT) 2.5 MCG/ACT AERS Inhale 2 Inhalers into the lungs daily. 3 Inhaler 4  . Brimonidine Tartrate-Timolol (COMBIGAN OP) Apply 1 drop to eye 2 (two) times daily.     No current facility-administered medications for this visit.     Review of Systems Review of Systems  Constitutional: Negative.   Respiratory: Negative.   Cardiovascular: Negative.     Blood pressure 118/68, pulse 86, resp. rate 12, height 5\' 4"  (1.626 m), weight 98 lb 9.6 oz (44.7 kg), SpO2 99 %.  Physical Exam Physical Exam Constitutional:      Appearance: Normal appearance.  Neck:     Musculoskeletal: Normal range of motion and neck supple.  Cardiovascular:     Rate and Rhythm: Normal rate and regular rhythm.  Pulmonary:     Effort: Pulmonary effort is normal.     Breath sounds: Normal breath sounds.  Chest:    Musculoskeletal: Normal range of motion.  Lymphadenopathy:     Cervical: No cervical adenopathy.     Upper Body:     Right upper body: No supraclavicular or axillary adenopathy.     Left upper body: No supraclavicular or axillary adenopathy.  Skin:    General: Skin is warm and dry.  Neurological:     General: No focal deficit present.     Mental Status: She is alert and oriented to person, place, and time. Mental status is at baseline.     Data Reviewed Mammograms from July 2018, July 2019 and January 2020 reviewed.   Progressive calcifications in the upper outer and lower inner quadrant of the left breast.  BI-RADS-4.  Assessment    Stable clinical exam with progressive calcifications.  No history of trauma.    Plan    Indications for stereotactic biopsy reviewed.  Patient reports that she could not lie down, and I reassured her that with the new seated stereotactic table I would anticipate she would do fine.  Her husband brought up the question of why we just do not excise these area with wire localization.  Certainly of cancer be identified it would certainly change the treatment of the breast and axilla.  If both sites were positive considering her very modest breast volume she might be best served by mastectomy rather than an attempted breast conservation.  At this time the patient and her family are amenable to proceed to stereotactic biopsy and this will be scheduled at a convenient date at [facility].     HPI, Physical Exam, Assessment and Plan have been scribed under the direction and in the presence of Robert Bellow, MD. Karie Fetch, RN  I have completed the exam and reviewed the above documentation for accuracy and completeness.  I agree with the above.  Haematologist has been used and any errors in dictation or transcription are unintentional.  Hervey Ard, M.D., F.A.C.S.   Forest Gleason Attilio Zeitler 01/18/2019, 2:27 PM

## 2019-01-19 ENCOUNTER — Telehealth: Payer: Self-pay | Admitting: *Deleted

## 2019-01-19 NOTE — Telephone Encounter (Signed)
I received a call from Sam in [place] at [facility] who called patient to arrange stereo biopsy.   Patient declined scheduling at this time. Per Sam, patient wants to think about having this done and will call her back if she wishes to arrange.   Message sent to Dr. Bary Castilla regarding the above.

## 2019-01-31 ENCOUNTER — Telehealth: Payer: Self-pay | Admitting: General Surgery

## 2019-01-31 NOTE — Telephone Encounter (Signed)
The patient had not followed through with the planned stereotactic biopsy of the microcalcifications in the left breast.  She reports ongoing trouble with her ataxia and that this is a "bigger problem".  Had a long discussion about the pros and cons about intervention at this time and if she did not want to proceed to biopsy one would she consider having a repeat mammogram, or should be stopped them altogether if were not can act on the information they provide.  At this time, she is amenable to rescheduling the stereotactic biopsy.  We will plan on having them do the most suspicious area of calcifications, the larger, first and if this is well-tolerated get the smaller second group if possible.  If only 1 can be done the larger cluster is the prime target.

## 2019-01-31 NOTE — Telephone Encounter (Signed)
Patient contacted patient and states she is now ready to proceed with stereo biopsy.   Message to Sam at [facility] to please contact patient to arrange.

## 2019-01-31 NOTE — Telephone Encounter (Signed)
Patient has been scheduled for a stereo biopsy on 02-09-19 at 1 pm at [facility].

## 2019-02-09 ENCOUNTER — Ambulatory Visit
Admission: RE | Admit: 2019-02-09 | Discharge: 2019-02-09 | Disposition: A | Discharge status: Home / Self Care | Payer: Medicare Other | Source: Ambulatory Visit | Attending: General Surgery | Admitting: General Surgery

## 2019-02-09 DIAGNOSIS — R920 Mammographic microcalcification found on diagnostic imaging of breast: Secondary | ICD-10-CM | POA: Diagnosis present

## 2019-02-09 DIAGNOSIS — Z853 Personal history of malignant neoplasm of breast: Secondary | ICD-10-CM

## 2019-02-09 HISTORY — PX: BREAST BIOPSY: SHX20

## 2019-02-10 ENCOUNTER — Other Ambulatory Visit: Payer: Self-pay | Admitting: Anatomic Pathology & Clinical Pathology

## 2019-02-10 ENCOUNTER — Telehealth: Payer: Self-pay | Admitting: General Surgery

## 2019-02-10 NOTE — Telephone Encounter (Signed)
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  She reports tolerating the biopsies well.  Considering her very small breast volume in 2 sites I think she would likely be best managed with a simple mastectomy rather than attempted wide excision x2 followed by radiation.  With the inclement weather upon [place], arrangements have been made to get together on Tuesday, February 25 11:45 AM to review options in person.

## 2019-02-14 ENCOUNTER — Encounter: Payer: Self-pay | Admitting: Pulmonary Disease

## 2019-02-14 ENCOUNTER — Ambulatory Visit: Payer: Medicare Other | Admitting: Pulmonary Disease

## 2019-02-14 VITALS — BP 112/78 | HR 89 | Ht 64.0 in | Wt 97.0 lb

## 2019-02-14 DIAGNOSIS — J449 Chronic obstructive pulmonary disease, unspecified: Secondary | ICD-10-CM | POA: Diagnosis not present

## 2019-02-14 LAB — SURGICAL PATHOLOGY

## 2019-02-14 MED ORDER — FLUTICASONE FUROATE-VILANTEROL 100-25 MCG/INH IN AEPB: 1.0000 | INHALATION_SPRAY | Freq: Every day | RESPIRATORY_TRACT | 5 refills | Status: DC

## 2019-02-14 MED ORDER — TIOTROPIUM BROMIDE MONOHYDRATE 2.5 MCG/ACT IN AERS: 2.0000 | INHALATION_SPRAY | Freq: Every day | RESPIRATORY_TRACT | 5 refills | Status: DC

## 2019-02-14 MED ORDER — ALBUTEROL SULFATE HFA 108 (90 BASE) MCG/ACT IN AERS: 2.0000 | INHALATION_SPRAY | Freq: Four times a day (QID) | RESPIRATORY_TRACT | 5 refills | Status: DC | PRN

## 2019-02-14 NOTE — Progress Notes (Signed)
Synopsis: Referred in 10/2018 for COPD  Subjective:   PATIENT ID: Elaine Clayton GENDER: female DOB: May 27, 1946, MRN: [id number]   HPI  Chief Complaint  Patient presents with  . Follow-up    Pt states she is better in the morning-has PT; then by 12pm she gives out. Only 1 breathing attack since last visit-used nebulizer machine and medications throughout that time.    Elaine Clayton is a 73 year old female for follow-up COPD.  Since starting Spiriva on our last visit, she feels it has improved her chest pressure and shortness of breath. She is particpating PT twice a week. She has been participating in pool exercises. Afterwards she has shortness of breath but nothing similar to an exacerbation.  She denies any exacerbations since our last visit. 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   Former smoker. 35 pack-years.  Quit in 2009. Environmental exposures: None Retired Marine scientist.  Husband previously worked as a Advice worker.  I have personally reviewed patient's past medical/family/social history/allergies/current medications.   Allergies  Allergen Reactions  . Influenza Vaccines Anaphylaxis  . Flu Virus Vaccine Other (See Comments)    Muscle spams  . Gabapentin Anxiety  . Sulfa Antibiotics Rash  . Tetracyclines & Related Rash     Outpatient Medications Prior to Visit  Medication Sig Dispense Refill  . albuterol (PROVENTIL) (2.5 MG/3ML) 0.083% nebulizer solution Take 3 mLs (2.5 mg total) by nebulization every 6 (six) hours as needed for wheezing or shortness of breath. 75 mL 12  . ALPRAZolam (XANAX) 0.5 MG tablet Take 0.5 mg by mouth 2 (two) times daily as needed for sleep. 0.5-1 tablet    . AZOPT 1 % ophthalmic suspension Place 1 drop into the left eye daily.   0  . benzonatate (TESSALON) 100 MG capsule Take 1 capsule (100 mg total) by mouth 3 (three) times daily. 30 capsule 1  .  bisacodyl (DULCOLAX) 5 MG EC tablet Take 5 mg by mouth daily as needed for moderate constipation.    . Brimonidine Tartrate-Timolol (COMBIGAN OP) Apply 1 drop to eye 2 (two) times daily.    Marland Kitchen buPROPion (WELLBUTRIN SR) 150 MG 12 hr tablet Take 300 mg by mouth daily.     Marland Kitchen dextromethorphan 15 MG/5ML syrup Take 10 mLs by mouth 4 (four) times daily as needed for cough.    . escitalopram (LEXAPRO) 10 MG tablet Take 10 mg by mouth daily. Patient takes 1.5 tablets daily.  0  . estradiol-levonorgestrel (CLIMARA PRO) 0.045-0.015 MG/DAY apply 1 patch every week 12 patch 4  . ipratropium (ATROVENT) 0.06 % nasal spray Place 2 sprays into both nostrils 4 (four) times daily as needed for rhinitis.    Marland Kitchen LUMIGAN 0.01 % SOLN apply 1 drop to into both  eyes at bedtime once daily ONLY  0  . mirabegron ER (MYRBETRIQ) 25 MG TB24 tablet Take 1 tablet (25 mg total) by mouth daily. 90 tablet 3  . nicotine polacrilex (NICORETTE) 2 MG gum Take 2 mg by mouth as needed for smoking cessation.    . ondansetron (ZOFRAN) 8 MG tablet Take by mouth every 8 (eight) hours as needed for nausea or vomiting.    . rosuvastatin (CRESTOR) 10 MG tablet Take 10 mg by mouth at bedtime.     Marland Kitchen albuterol (VENTOLIN HFA) 108 (90 Base) MCG/ACT inhaler Inhale 2 puffs into the lungs every 6 (six) hours as needed for wheezing or  shortness of breath. 1 Inhaler 3  . fluticasone furoate-vilanterol (BREO ELLIPTA) 100-25 MCG/INH AEPB Inhale 1 puff into the lungs daily. 3 each 3  . Tiotropium Bromide Monohydrate (SPIRIVA RESPIMAT) 2.5 MCG/ACT AERS Inhale 2 Inhalers into the lungs daily. 3 Inhaler 4   No facility-administered medications prior to visit.     Review of Systems  Constitutional: Negative for chills, diaphoresis, fever, malaise/fatigue and weight loss.  HENT: Negative for congestion, ear pain and sore throat.   Respiratory: Positive for shortness of breath. Negative for cough, hemoptysis, sputum production and wheezing.   Cardiovascular:  Positive for chest pain. Negative for palpitations and leg swelling.  Gastrointestinal: Negative for abdominal pain, heartburn and nausea.  Genitourinary: Negative for frequency.  Musculoskeletal: Negative for joint pain and myalgias.  Skin: Negative for itching and rash.  Neurological: Negative for dizziness, weakness and headaches.  Endo/Heme/Allergies: Does not bruise/bleed easily.  Psychiatric/Behavioral: Negative for depression. The patient is not nervous/anxious.      Objective:    Vitals:   02/14/19 1121  BP: 112/78  Pulse: 89  SpO2: 95%  Weight: 97 lb (44 kg)  Height: 5\' 4"  (1.626 m)   SpO2: 95 % O2 Device: None (Room air)   Physical Exam: General: Thin, frail-appearing, no acute distress HENT: [place], AT, OP clear, MMM Eyes: EOMI, no scleral icterus Respiratory: Decreased breath sounds bilaterally. No crackles, wheezing or rales Cardiovascular: RRR, -M/R/G, no JVD GI: BS+, soft, nontender Extremities:-Edema,-tenderness Neuro: AAO x4, CNII-XII grossly intact Skin: Intact, no rashes or bruising Psych: Normal mood, normal affect  Chest imaging: CXR 10/25/15 - Hyperinflated lung fields. No airspace disease.  PFT: 10/16/18 - Ratio 58 FEV1 1.37L (57%) FVC 2.31L (73%)  Moderately severe obstructive defect with air trapping and severely decreased DLCO consistent with emphysema  I have personally reviewed the above labs, images and tests noted above.    Assessment & Plan:   73 year old female with moderately severe COPD.  Gold class B. 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  COPD, moderately severe (FEV1 57%) --CONTINUE Spiriva Respimat 2.5 mcg 2 inhalations once a day. Refilled.  --CONTINUE Breo 100-25 mcg 1 puff daily.  Refilled. --CONTINUE Albuterol inhaler 2 puffs every 4 hours as needed for shortness of breath or wheezing. Refilled --Lung Cancer Screen Referral placed on last visit   Meds  ordered this encounter  Medications  . albuterol (VENTOLIN HFA) 108 (90 Base) MCG/ACT inhaler    Sig: Inhale 2 puffs into the lungs every 6 (six) hours as needed for wheezing or shortness of breath.    Dispense:  1 Inhaler    Refill:  5  . Tiotropium Bromide Monohydrate (SPIRIVA RESPIMAT) 2.5 MCG/ACT AERS    Sig: Inhale 2 Inhalers into the lungs daily.    Dispense:  1 Inhaler    Refill:  5  . fluticasone furoate-vilanterol (BREO ELLIPTA) 100-25 MCG/INH AEPB    Sig: Inhale 1 puff into the lungs daily.    Dispense:  1 each    Refill:  5    Return in about 4 months (around 06/15/2019).   Lorren Splawn Rodman Pickle, MD [facility] 02/19/2019 7:20 PM  Personal pager: [phone] If unanswered, please page CCM On-call: [phone]

## 2019-02-15 ENCOUNTER — Other Ambulatory Visit: Payer: Self-pay

## 2019-02-15 ENCOUNTER — Ambulatory Visit (INDEPENDENT_AMBULATORY_CARE_PROVIDER_SITE_OTHER): Payer: Medicare Other | Admitting: General Surgery

## 2019-02-15 ENCOUNTER — Encounter: Payer: Self-pay | Admitting: General Surgery

## 2019-02-15 VITALS — BP 156/93 | HR 90 | Temp 97.9°F | Resp 16 | Ht 64.0 in | Wt 97.0 lb

## 2019-02-15 DIAGNOSIS — C50912 Malignant neoplasm of unspecified site of left female breast: Secondary | ICD-10-CM

## 2019-02-15 NOTE — Progress Notes (Signed)
Patient ID: Elaine Clayton, female   DOB: 1946/09/29, 73 y.o.   MRN: [id number]  Chief Complaint  Patient presents with  . Breast Cancer    HPI Elaine Clayton is a 73 y.o. female here today for breast discussion.  The patient had undergone stereotactic biopsy x2 earlier this month and presents today to review options for management.  She had previously been contacted by phone with the results. HPI  Past Medical History:  Diagnosis Date  . Anemia   . Ataxia    Apaxia  . Bipolar affective disorder ([place])    2  . Broken foot 2015   right foot  . Chronic gastritis   . Chronic kidney disease    Dr Holley Raring  . COPD (chronic obstructive pulmonary disease) ([place])   . Dizziness    inner ear (per pt) several times per week  . Dysphagia   . Glaucoma   . Hyperlipemia   . Hypertension    pt has recently come off BP meds.  MD aware. BP seems stable.  . Lithium toxicity 2015  . 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 Neck stiffness    s/p C3-C4 fusion, limited right turn  . Osteoarthritis    back  . Pancreatitis   . Parkinson disease ([place])   . Personal history of malignant neoplasm of breast   . Pituitary microadenoma ([place])   . Recurrent UTI   . Renal insufficiency   . Stomach ulcer 2112    Past Surgical History:  Procedure Laterality Date  . APPENDECTOMY    . BREAST BIOPSY Right 2011   neg  . BREAST BIOPSY Left 02/09/2019   affirm stereo 2 areas/group 1 x clip/ group 2 coil clip/path pending  . BREAST EXCISIONAL BIOPSY Right 2001   neg  . 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 BREAST EXCISIONAL BIOPSY Left 10/26/2012   neg  . BREAST SURGERY Left  2013   left breast wide excision,Intraductal papilloma, ductal hyperplasia and sclerosing adenosis. Microcalcifications associated with columnar cell change. No evidence of atypia or malignancy. Margins  are unremarkable.  Marland Kitchen BREAST SURGERY Right November 06, 1999   multiple areas of microcalcification showing evidence of sclerosing adenosis and ductal hyperplasia.  Marland Kitchen BREAST SURGERY Left May 07, 2007   wide excision.  . cataract Right 2013  . CATARACT EXTRACTION W/PHACO Left 03/05/2016   Procedure: CATARACT EXTRACTION PHACO AND INTRAOCULAR LENS PLACEMENT (IOC);  Surgeon: Leandrew Koyanagi, MD;  Location: [place];  Service: Ophthalmology;  Laterality: Left;  . COLONOSCOPY  2004  . COLONOSCOPY N/A 05/14/2015   Procedure: COLONOSCOPY;  Surgeon: Manya Silvas, MD;  Location: [facility] ENDOSCOPY;  Service: Endoscopy;  Laterality: N/A;  . ERCP N/A   . ESOPHAGOGASTRODUODENOSCOPY N/A 05/14/2015   Procedure: ESOPHAGOGASTRODUODENOSCOPY (EGD);  Surgeon: Manya Silvas, MD;  Location: [facility] ENDOSCOPY;  Service: Endoscopy;  Laterality: N/A;  . EUS N/A 2011, 2012  . EYE SURGERY  2013  . GLAUCOMA SURGERY Right 2013  . LAPAROSCOPIC RIGHT COLECTOMY Right 06/22/2015   LAPAROSCOPIC RIGHT COLECTOMY;  Surgeon: Robert Bellow, MD;  Location: [facility] ORS; 1.6 cm tubular adenoma.  Marland Kitchen NECK SURGERY  2006  . right breast cancer    . SAVORY DILATION N/A 05/14/2015   Procedure: SAVORY DILATION;  Surgeon: Manya Silvas, MD;  Location: [facility] ENDOSCOPY;  Service: Endoscopy;  Laterality:  N/A;  . TRABECULECTOMY Left 03/05/2016   Procedure: TRABECULECTOMY WITH [facility] AND EXPRESS SHUNT;  Surgeon: Leandrew Koyanagi, MD;  Location: [place];  Service: Ophthalmology;  Laterality: Left;  . TUBAL LIGATION      Family History  Problem Relation Age of Onset  . Breast cancer Mother 59  . Kidney cancer Neg Hx   . Bladder Cancer Neg Hx     Social History Social History   Tobacco Use  . Smoking status: Former Smoker    Packs/day: 1.00    Years: 35.00    Pack years: 35.00    Last attempt to quit: 12/23/2007    Years since quitting: 11.1  . Smokeless tobacco: Never Used  . Tobacco comment: Uses patch   Substance Use Topics  . Alcohol use: Yes    Alcohol/week: 0.0 standard drinks    Comment: 1 glass wine/mo  . Drug use: No    Allergies  Allergen Reactions  . Influenza Vaccines Anaphylaxis  . Flu Virus Vaccine Other (See Comments)    Muscle spams  . Gabapentin Anxiety  . Sulfa Antibiotics Rash  . Tetracyclines & Related Rash    Current Outpatient Medications  Medication Sig Dispense Refill  . albuterol (PROVENTIL) (2.5 MG/3ML) 0.083% nebulizer solution Take 3 mLs (2.5 mg total) by nebulization every 6 (six) hours as needed for wheezing or shortness of breath. 75 mL 12  . albuterol (VENTOLIN HFA) 108 (90 Base) MCG/ACT inhaler Inhale 2 puffs into the lungs every 6 (six) hours as needed for wheezing or shortness of breath. 1 Inhaler 5  . ALPRAZolam (XANAX) 0.5 MG tablet Take 0.5 mg by mouth 2 (two) times daily as needed for sleep. 0.5-1 tablet    . AZOPT 1 % ophthalmic suspension Place 1 drop into the left eye daily.   0  . benzonatate (TESSALON) 100 MG capsule Take 1 capsule (100 mg total) by mouth 3 (three) times daily. 30 capsule 1  . bisacodyl (DULCOLAX) 5 MG EC tablet Take 5 mg by mouth daily as needed for moderate constipation.    . Brimonidine Tartrate-Timolol (COMBIGAN OP) Apply 1 drop to eye 2 (two) times daily.    Marland Kitchen buPROPion (WELLBUTRIN SR) 150 MG 12 hr tablet Take 300 mg by mouth daily.     Marland Kitchen dextromethorphan 15 MG/5ML syrup Take 10 mLs by mouth 4 (four) times daily as needed for cough.    . escitalopram (LEXAPRO) 10 MG tablet Take 10 mg by mouth daily. Patient takes 1.5 tablets daily.  0  . estradiol-levonorgestrel (CLIMARA PRO) 0.045-0.015 MG/DAY apply 1 patch every week 12 patch 4  . fluticasone furoate-vilanterol (BREO ELLIPTA) 100-25 MCG/INH AEPB Inhale 1 puff into the lungs daily. 1 each 5  . ipratropium (ATROVENT) 0.06 % nasal spray Place 2 sprays into both nostrils 4 (four) times daily as needed for rhinitis.    Marland Kitchen LUMIGAN 0.01 % SOLN apply 1 drop to into both  eyes  at bedtime once daily ONLY  0  . mirabegron ER (MYRBETRIQ) 25 MG TB24 tablet Take 1 tablet (25 mg total) by mouth daily. 90 tablet 3  . nicotine polacrilex (NICORETTE) 2 MG gum Take 2 mg by mouth as needed for smoking cessation.    . ondansetron (ZOFRAN) 8 MG tablet Take by mouth every 8 (eight) hours as needed for nausea or vomiting.    . rosuvastatin (CRESTOR) 10 MG tablet Take 10 mg by mouth at bedtime.     . Tiotropium Bromide Monohydrate (SPIRIVA RESPIMAT)  2.5 MCG/ACT AERS Inhale 2 Inhalers into the lungs daily. 1 Inhaler 5   No current facility-administered medications for this visit.     Review of Systems Review of Systems  Blood pressure (!) 156/93, pulse 90, temperature 97.9 F (36.6 C), temperature source Skin, resp. rate 16, height _0  (1.626 m), weight 97 lb (44 kg), SpO2 97 %.  Physical Exam Physical Exam Nursing staff reported minimal bruising at the biopsy site.  Data Reviewed A. BREAST, LEFT, UPPER OUTER QUADRANT; STEREOTACTIC-GUIDED CORE BIOPSY:  - INVASIVE LOBULAR CARCINOMA.  - DUCTAL CARCINOMA IN SITU, INTERMEDIATE NUCLEAR GRADE WITH FOCAL  NECROSIS, ASSOCIATED WITH CALCIFICATIONS.   Size of invasive carcinoma: 4 mm in this sample  Histologic grade of invasive carcinoma: Grade 2            Glandular/tubular differentiation score: 3            Nuclear pleomorphism score: 2            Mitotic rate score: 1            Total score: 6  Lymphovascular invasion: Not identified.    B. BREAST, LEFT, LOWER INNER QUADRANT; STEREOTACTIC-GUIDED CORE BIOPSY:  - FLAT EPITHELIAL ATYPIA CONTAINING CALCIFICATIONS.  - NO IN SITU OR INVASIVE CARCINOMA SEEN IN THIS SAMPLE.  BREAST BIOMARKER TESTS  Estrogen Receptor (ER) Status: POSITIVE            Percentage of cells with nuclear positivity: >90%            Average intensity of staining: Strong   Progesterone Receptor (PgR) Status: NEGATIVE (<1%)             Internal control cells present and stain as  expected   HER2 (by immunohistochemistry): NEGATIVE (Score 0)    Assessment Invasive lobular carcinoma of the left breast.  Plan The majority of the 40-minute office visit was devoted to management options.  The patient had previously undergone wide excision and radiation for an invasive tubular carcinoma treated with wide excision and radiation.  She did well with this.  With the identification of invasive lobular carcinoma as well as atypia in the second site, and in light of the very small breast volume, her best option would likely be simple mastectomy rather than 2 lumpectomies followed by radiation.  Considering her general health, strong consideration would be to omit radiation if she chose breast conservation.  She has an ER positive tumor and antiestrogen therapy alone might be considered, although this would be a poor third choice.  At this time, the patient and her husband will consider their options and notify the office of how they would like to proceed.    Forest Gleason Ovadia Lopp 02/16/2019, 1:32 PM

## 2019-02-16 ENCOUNTER — Encounter: Payer: Self-pay | Admitting: General Surgery

## 2019-02-16 DIAGNOSIS — C50912 Malignant neoplasm of unspecified site of left female breast: Secondary | ICD-10-CM | POA: Insufficient documentation

## 2019-02-21 ENCOUNTER — Telehealth: Payer: Self-pay | Admitting: Pulmonary Disease

## 2019-02-21 NOTE — Telephone Encounter (Signed)
FYI:  I have been trying to get pt scheduled for lung cancer screening but have been unable to do so.  Pt now no longer qualifies due to recent dx of Breast Cancer.  Referral has been cancelled.

## 2019-02-24 ENCOUNTER — Encounter: Payer: Self-pay | Admitting: *Deleted

## 2019-02-24 NOTE — Progress Notes (Signed)
  Oncology Nurse Navigator Documentation  Navigator Location: [facility] (02/24/19 1500)   )Navigator Encounter Type: Introductory phone call (02/24/19 1500)   Abnormal Finding Date: 01/14/19 (02/24/19 1500) Confirmed Diagnosis Date: 02/10/19 (02/24/19 1500)                   Barriers/Navigation Needs: Education (02/24/19 1500)                          Time Spent with Patient: 15 (02/24/19 1500)   Left patient a message to return my call.  I would like to establish navigation services.

## 2019-02-28 ENCOUNTER — Encounter: Payer: Self-pay | Admitting: *Deleted

## 2019-03-01 NOTE — Progress Notes (Signed)
  Oncology Nurse Navigator Documentation  Navigator Location: [facility] (03/01/19 1500)   )Navigator Encounter Type: Telephone (03/01/19 1500) Telephone: Incoming Call (03/01/19 1500)                       Barriers/Navigation Needs: Education (03/01/19 1500)                          Time Spent with Patient: 45 (03/01/19 1500)   Patient returned my call.  We talked for over 45 minutes.  Answered questions and offered support.  She currently has ataxia and is very concerned about surgery.  She is planning to call Dr. Bary [facility] on Tuesday to review her plan to move forward with surgery.  She is to call if she has any questions or needs.

## 2019-03-03 ENCOUNTER — Telehealth: Payer: Self-pay

## 2019-03-03 ENCOUNTER — Other Ambulatory Visit: Payer: Self-pay

## 2019-03-03 DIAGNOSIS — C50912 Malignant neoplasm of unspecified site of left female breast: Secondary | ICD-10-CM

## 2019-03-03 MED ORDER — LIDOCAINE-PRILOCAINE 2.5-2.5 % EX CREA: TOPICAL_CREAM | CUTANEOUS | 0 refills | Status: DC

## 2019-03-03 NOTE — Telephone Encounter (Signed)
Call to patient's husband to schedule surgery.  Patient's surgery has been scheduled for 03/28/19 at [facility] with Dr. Bary [facility]  The patient will need to Pre-Admit prior to surgery. We will call with that appointment date and time. Patient will check in at the [place], [address] (first floor).   She will be seen for a short pre op appointment with Dr Bary [facility] on 03/17/19 at 10:00 am.   The patient is aware to call the office should she have further questions.

## 2019-03-03 NOTE — Telephone Encounter (Signed)
The patient will pre admit at the hospital on 03/17/19 at 11 am after she sees Dr Bary [facility]. She will arrive at the hospital on 03/28/19 at 7:45 am and will make use of EMLA cream applying to her left areola and covering with plastic wrap one hour prior to leaving for surgery. She is aware of date, time, and instructions.

## 2019-03-04 ENCOUNTER — Telehealth: Payer: Self-pay | Admitting: General Surgery

## 2019-03-04 NOTE — Telephone Encounter (Signed)
The patient had been scheduled for a mastectomy with sentinel node biopsy on March 28, 2019 for an area of invasive lobular carcinoma and an area of flat atypia hyperplasia.  The hour that would require the patient to get to [facility] from her home in [place] at this time is not acceptable.  Patient still struggling with the idea of mastectomy.  Discussed the possibility of making use of an antiestrogen such as Femara now while she makes her decision, she declined.  Offered to consider excision of both areas with or without radiation, ideally with an antiestrogen, declined at present.  We will cancel the surgery presently scheduled for April 6 and wait for the patient and family to contact the office regarding rescheduling.

## 2019-03-10 ENCOUNTER — Encounter: Payer: Self-pay | Admitting: *Deleted

## 2019-03-10 NOTE — Progress Notes (Signed)
Per Dr. Bary Castilla, patient has decided to proceed with surgery on 03-28-19. *Dr. Rosey Bath or Dr. Randa Lynn needs to be on this case.   We will work on getting this rescheduled and contact patient with Pre-admit appointment and arrival time day of surgery.   EMLA cream previously sent in to patient's pharmacy.

## 2019-03-11 NOTE — Progress Notes (Addendum)
Call to the patient. She is scheduled for surgery at [facility] with Dr Bary [facility] on 03/28/19. She will Pre admit at the hospital in the [facility] on 03/17/19 at 11:00 am. The day of surgery she will report to the Radiology desk in the [facility] at 10:15 am and will make use of EMLA cream one hour prior and cover with plastic wrap. The patient is aware of date, time, and instructions.  She will not need to be seen by Dr Bary [facility]illa prior to surgery and we will call her about this.

## 2019-03-14 ENCOUNTER — Other Ambulatory Visit: Payer: Self-pay | Admitting: General Surgery

## 2019-03-17 ENCOUNTER — Ambulatory Visit: Payer: Medicare Other | Admitting: General Surgery

## 2019-03-17 ENCOUNTER — Other Ambulatory Visit: Payer: Self-pay

## 2019-03-17 ENCOUNTER — Inpatient Hospital Stay: Admission: RE | Admit: 2019-03-17 | Payer: Medicare Other | Source: Ambulatory Visit

## 2019-03-17 ENCOUNTER — Encounter
Admission: RE | Admit: 2019-03-17 | Discharge: 2019-03-17 | Disposition: A | Discharge status: Home / Self Care | Payer: Medicare Other | Source: Ambulatory Visit | Attending: General Surgery | Admitting: General Surgery

## 2019-03-17 DIAGNOSIS — Z01818 Encounter for other preprocedural examination: Secondary | ICD-10-CM | POA: Diagnosis present

## 2019-03-17 DIAGNOSIS — I10 Essential (primary) hypertension: Secondary | ICD-10-CM | POA: Diagnosis not present

## 2019-03-17 HISTORY — DX: Thyrotoxicosis, unspecified without thyrotoxic crisis or storm: E05.90

## 2019-03-17 LAB — BASIC METABOLIC PANEL
Anion gap: 9 (ref 5–15)
BUN: 14 mg/dL (ref 8–23)
CO2: 24 mmol/L (ref 22–32)
Calcium: 9.2 mg/dL (ref 8.9–10.3)
Chloride: 105 mmol/L (ref 98–111)
Creatinine, Ser: 1.02 mg/dL — ABNORMAL HIGH (ref 0.44–1.00)
GFR calc Af Amer: >60 mL/min (ref >60)
GFR calc non Af Amer: 56 mL/min — ABNORMAL LOW (ref >60)
Glucose, Bld: 86 mg/dL (ref 70–99)
Potassium: 4.1 mmol/L (ref 3.5–5.1)
Sodium: 138 mmol/L (ref 135–145)

## 2019-03-17 LAB — CBC
HCT: 35.8 % — ABNORMAL LOW (ref 36.0–46.0)
HEMOGLOBIN: 11.1 g/dL — AB (ref 12.0–15.0)
MCH: 26.1 pg (ref 26.0–34.0)
MCHC: 31 g/dL (ref 30.0–36.0)
MCV: 84.2 fL (ref 80.0–100.0)
Platelets: 274 10*3/uL (ref 150–400)
RBC: 4.25 MIL/uL (ref 3.87–5.11)
RDW: 14.8 % (ref 11.5–15.5)
WBC: 5.7 10*3/uL (ref 4.0–10.5)
nRBC: 0 % (ref 0.0–0.2)

## 2019-03-17 NOTE — Patient Instructions (Signed)
Your procedure is scheduled on: Monday, April 6, 20 Report to Radiology Desk at the [place] at the time instructed.  REMEMBER: Instructions that are not followed completely may result in serious medical risk, up to and including death; or upon the discretion of your surgeon and anesthesiologist your surgery may need to be rescheduled.  Do not eat food after midnight the night before surgery.  No gum chewing, lozengers or hard candies.  You may however, drink CLEAR liquids up to 2 hours before you are scheduled to arrive for your surgery. Do not drink anything within 2 hours of the start of your surgery.  Clear liquids include: - water  - apple juice without pulp - gatorade - black coffee or tea (Do NOT add milk or creamers to the coffee or tea) Do NOT drink anything that is not on this list.  No Alcohol for 24 hours before or after surgery.  No Smoking including e-cigarettes for 24 hours prior to surgery.  No chewable tobacco products for at least 6 hours prior to surgery.  No nicotine patches on the day of surgery.  On the morning of surgery brush your teeth with toothpaste and water, you may rinse your mouth with mouthwash if you wish. Do not swallow any toothpaste or mouthwash.  Notify your doctor if there is any change in your medical condition (cold, fever, infection).  Do not wear jewelry, make-up, hairpins, clips or nail polish.  Do not wear lotions, powders, or perfumes.   Do not shave 48 hours prior to surgery.   Contacts and dentures may not be worn into surgery.  Do not bring valuables to the hospital, including drivers license, insurance or credit cards.  [facility] is not responsible for any belongings or valuables.   TAKE THESE MEDICATIONS THE MORNING OF SURGERY:  1.  Albuterol nebulizer 2.  azopt eye drop 3.  Combigan eye drop 4.  Bupropion 5.  lexapro 6.  breo ellipta inhaler 7.  EMLA cream 8.  Spiriva inhaler  Use CHG Soap as directed on  instruction sheet.  Use inhalers on the day of surgery and bring to the hospital..  Starting March 30 - Stop Anti-inflammatories (NSAIDS) such as Advil, Aleve, Ibuprofen, Motrin, Naproxen, Naprosyn and Aspirin based products such as Excedrin, Goodys Powder, BC Powder. (May take Tylenol or Acetaminophen if needed.)  Starting March 30 - Stop ANY OVER THE COUNTER supplements until after surgery.  Wear comfortable clothing (specific to your surgery type) to the hospital.  If you are being discharged the day of surgery, you will not be allowed to drive home. You will need a responsible adult to drive you home and stay with you that night.   If you are taking public transportation, you will need to have a responsible adult with you. Please confirm with your physician that it is acceptable to use public transportation.   Please call [phone] if you have any questions about these instructions.

## 2019-03-23 ENCOUNTER — Ambulatory Visit: Payer: Medicare Other | Admitting: Urology

## 2019-03-24 ENCOUNTER — Encounter: Payer: Self-pay | Admitting: Anesthesiology

## 2019-03-24 ENCOUNTER — Encounter: Payer: Self-pay | Admitting: *Deleted

## 2019-03-24 NOTE — Progress Notes (Signed)
Talked to patients husband Today.  He states she has decided to have a mastectomy and surgery is planned for Monday.  He asked if I would be there to check on them.  Explained that due to current circumstances the navigators are taking turns coming in, while the others work remotely.  Informed him I would the navigator come by to check on them.  Will [employer].

## 2019-03-25 ENCOUNTER — Other Ambulatory Visit: Payer: Self-pay | Admitting: General Surgery

## 2019-03-25 ENCOUNTER — Telehealth: Payer: Self-pay | Admitting: *Deleted

## 2019-03-25 ENCOUNTER — Telehealth: Payer: Self-pay | Admitting: General Surgery

## 2019-03-25 MED ORDER — TAMOXIFEN CITRATE 20 MG PO TABS: 20.0000 mg | ORAL_TABLET | Freq: Every day | ORAL | 12 refills | Status: DC

## 2019-03-25 NOTE — Progress Notes (Signed)
w

## 2019-03-25 NOTE — Telephone Encounter (Signed)
-----   Message from Robert Bellow, MD sent at 03/25/2019  8:24 AM EDT ----- OR for Monday is cancelled, patient and family aware.

## 2019-03-25 NOTE — Telephone Encounter (Signed)
Surgery sheet regarding cancellation for 03-28-19 has been faxed to the [place] in Nuclear Medicine is aware of cancellation.

## 2019-03-25 NOTE — Telephone Encounter (Signed)
Planned surgery for left mastectomy for breast cancer cancelled secondary to [place] virus pandemic.  Patient had been reluctant in the past to use suppressive therapy, but is now willing. Reviewed med list, and ongoing use of Climara for vasomotor symptoms and management of osteoporosis. Will try Tamoxifen, 20 mg daily. Patient to continue daily pediatric ASA. Need for one month trial emphasized.

## 2019-03-28 ENCOUNTER — Ambulatory Visit: Admit: 2019-03-28 | Payer: Medicare Other | Admitting: General Surgery

## 2019-03-28 ENCOUNTER — Ambulatory Visit: Payer: Medicare Other

## 2019-03-28 ENCOUNTER — Telehealth: Payer: Self-pay

## 2019-03-28 ENCOUNTER — Ambulatory Visit: Admission: RE | Admit: 2019-03-28 | Payer: Medicare Other | Source: Home / Self Care | Admitting: General Surgery

## 2019-03-28 ENCOUNTER — Encounter: Admission: RE | Payer: Self-pay | Source: Home / Self Care

## 2019-03-28 SURGERY — MASTECTOMY WITH SENTINEL LYMPH NODE BIOPSY
Anesthesia: Regional | Laterality: Left

## 2019-03-28 NOTE — Telephone Encounter (Signed)
Chart reviewed to obtain surgery time. Noted that surgery has been cancelled. Notified breast navigator, Mardene Speak.

## 2019-04-28 ENCOUNTER — Telehealth: Payer: Self-pay | Admitting: *Deleted

## 2019-04-28 NOTE — Telephone Encounter (Signed)
Patient contacted today in regards to rescheduling left simple mastectomy with SLN biopsy.   Patient's surgery to be scheduled for 05-09-19 at [facility] with Dr. Bary Castilla. The patient and husband are requesting surgery time be at 1 pm so that arrival time for SLN would be at 11 am. They are aware I will try and honor their request depending on what [facility] allows.   The patient had previously pre-admitted on 03-17-19. Hopefully, she will not need to repeat.   She will need to be screened for the coronavirus and we are trying to see if [facility] will allow this to be done on 05-03-19 before 11 am appointment with Dr. Bary Castilla. Patient will be contacted once we hear from [facility] in regards to this.

## 2019-04-29 NOTE — Telephone Encounter (Signed)
The patient will have her Covid testing done on 05/03/19 at 2 pm after her appointment with Dr Bary Castilla and her dental appointment. She is aware to report to [facility] on 05/09/19 at 10:45 am and go to the [place] entrance. She is aware to use the EMLA cream one hour prior to leaving for surgery.

## 2019-05-02 ENCOUNTER — Other Ambulatory Visit: Payer: Self-pay

## 2019-05-02 ENCOUNTER — Encounter
Admission: RE | Admit: 2019-05-02 | Discharge: 2019-05-02 | Disposition: A | Discharge status: Home / Self Care | Payer: Medicare Other | Source: Ambulatory Visit | Attending: General Surgery | Admitting: General Surgery

## 2019-05-02 HISTORY — DX: Repeated falls: R29.6

## 2019-05-02 HISTORY — DX: Other amnesia: R41.3

## 2019-05-02 NOTE — Patient Instructions (Signed)
Your procedure is scheduled on: 05/09/2019 Report to Nuclear Med. @ 10:45am.  Remember: Instructions that are not followed completely may result in serious medical risk, up to and including death, or upon the discretion of your surgeon and anesthesiologist your surgery may need to be rescheduled.    _x___ 1. Do not eat food after midnight the night before your procedure. You may drink clear liquids up to 2 hours before you are scheduled to arrive at the hospital for your procedure.  Do not drink clear liquids within 2 hours of your scheduled arrival to the hospital.  Clear liquids include  --Water or Apple juice without pulp  --Clear carbohydrate beverage such as ClearFast or Gatorade  --Black Coffee or Clear Tea (No milk, no creamers, do not add anything to                  the coffee or Tea Type 1 and type 2 diabetics should only drink water.   ____Ensure clear carbohydrate drink on the way to the hospital for bariatric patients  ____Ensure clear carbohydrate drink 3 hours before surgery for Dr Dwyane Luo patients if physician instructed.   No gum chewing or hard candies.     __x__ 2. No Alcohol for 24 hours before or after surgery.   __x__3. No Smoking or e-cigarettes for 24 prior to surgery.  Do not use any chewable tobacco products for at least 6 hour prior to surgery   ____  4. Bring all medications with you on the day of surgery if instructed.    __x__ 5. Notify your doctor if there is any change in your medical condition     (cold, fever, infections).    x___6. On the morning of surgery brush your teeth with toothpaste and water.  You may rinse your mouth with mouth wash if you wish.  Do not swallow any toothpaste or mouthwash.   Do not wear jewelry, make-up, hairpins, clips or nail polish.  Do not wear lotions, powders, or perfumes. You may wear deodorant.  Do not shave 48 hours prior to surgery. Men may shave face and neck.  Do not bring valuables to the hospital.     [facility] is not responsible for any belongings or valuables.               Contacts, dentures or bridgework may not be worn into surgery.  Leave your suitcase in the car. After surgery it may be brought to your room.  For patients admitted to the hospital, discharge time is determined by your                       treatment team.  _  Patients discharged the day of surgery will not be allowed to drive home.  You will need someone to drive you home and stay with you the night of your procedure.    Please read over the following fact sheets that you were given:   [facility] Preparing for Surgery and or MRSA Information   _x___ Take anti-hypertensive listed below, cardiac, seizure, asthma,     anti-reflux and psychiatric medicines. These include:  1. albuterol (VENTOLIN   2.ALPRAZolam  3.Brimonidine Tartrate-Timolol (COMBIGAN OP)  4.buPROPion (WELLBUTRIN SR)   5.esomeprazole (NEXIUM) 40 MG capsule  6.fluticasone furoate-vilanterol (BREO ELLIPTA)   7.Tiotropium Bromide Monohydrate (SPIRIVA   ____Fleets enema or Magnesium Citrate as directed.   _x___ Use CHG Soap or sage wipes as directed on instruction  sheet   _x___ Use inhalers on the day of surgery and bring to hospital day of surgery  ____ Stop Metformin and Janumet 2 days prior to surgery.    ____ Take 1/2 of usual insulin dose the night before surgery and none on the morning     surgery.   _x___ Follow recommendations from Cardiologist, Pulmonologist or PCP regarding          stopping Aspirin, Coumadin, Plavix ,Eliquis, Effient, or Pradaxa, and Pletal.  X____Stop Anti-inflammatories such as Advil, Aleve, Ibuprofen, Motrin, Naproxen, Naprosyn, Goodies powders or aspirin products. OK to take Tylenol and                          Celebrex.   _x___ Stop supplements until after surgery.  But may continue Vitamin D, Vitamin B,       and multivitamin.   ____ Bring C-Pap to the hospital.

## 2019-05-03 ENCOUNTER — Encounter: Payer: Self-pay | Admitting: General Surgery

## 2019-05-03 ENCOUNTER — Ambulatory Visit (INDEPENDENT_AMBULATORY_CARE_PROVIDER_SITE_OTHER): Payer: Medicare Other | Admitting: General Surgery

## 2019-05-03 ENCOUNTER — Other Ambulatory Visit: Payer: Self-pay

## 2019-05-03 ENCOUNTER — Other Ambulatory Visit
Admission: RE | Admit: 2019-05-03 | Discharge: 2019-05-03 | Disposition: A | Discharge status: Home / Self Care | Payer: Medicare Other | Source: Ambulatory Visit | Attending: General Surgery | Admitting: General Surgery

## 2019-05-03 VITALS — BP 166/72 | HR 74 | Temp 98.1°F | Resp 18 | Ht 65.0 in | Wt 105.0 lb

## 2019-05-03 DIAGNOSIS — C50912 Malignant neoplasm of unspecified site of left female breast: Secondary | ICD-10-CM | POA: Diagnosis not present

## 2019-05-03 DIAGNOSIS — Z1159 Encounter for screening for other viral diseases: Secondary | ICD-10-CM | POA: Insufficient documentation

## 2019-05-03 NOTE — Patient Instructions (Addendum)
  Left mastectomy scheduled for 05/09/2019.She is aware to use the EMLA cream one hour prior to leaving for surgery. The patient is aware to call back for any questions or concerns.  Marland Kitchen

## 2019-05-03 NOTE — Progress Notes (Signed)
Patient ID: Elaine Clayton, female   DOB: [birth year], 73 y.o.   MRN: [id number]  Chief Complaint  Patient presents with  . Pre-op Exam    HPI Elaine Clayton is a 73 y.o. female here today for her pre op mastectomy scheduled for 05/09/2019.  HPI  Past Medical History:  Diagnosis Date  . Anemia   . Ataxia    Apaxia  . Bipolar affective disorder ([place])    2  . Broken foot 2015   right foot  . Chronic gastritis   . Chronic kidney disease    Dr Holley Raring  . COPD (chronic obstructive pulmonary disease) ([place])   . Dizziness    inner ear (per pt) several times per week  . Dysphagia   . Falls frequently   . Glaucoma   . Hyperlipemia   . Hypertension    pt has recently come off BP meds.  MD aware. BP seems stable.  . Hyperthyroidism   . Lithium toxicity 2015  . 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 Memory difficulty   . Neck stiffness    s/p C3-C4 fusion, limited right turn  . Osteoarthritis    back  . Pancreatitis   . Parkinson disease ([place])   . Personal history of malignant neoplasm of breast   . Pituitary microadenoma ([place])   . Recurrent UTI   . Renal insufficiency   . Stomach ulcer 2112    Past Surgical History:  Procedure Laterality Date  . APPENDECTOMY    . BREAST BIOPSY Right 2011   neg  . BREAST BIOPSY Left 02/09/2019   affirm stereo 2 areas/group 1 x clip/ group 2 coil clip/path pending  . BREAST EXCISIONAL BIOPSY Right 2001   neg  . 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 BREAST EXCISIONAL BIOPSY Left 10/26/2012   neg  . BREAST SURGERY Left  2013   left breast wide excision,Intraductal papilloma, ductal hyperplasia and sclerosing adenosis. Microcalcifications associated with columnar cell change. No evidence of atypia or malignancy. Margins are unremarkable.  Marland Kitchen BREAST SURGERY Right November 06, 1999   multiple areas of  microcalcification showing evidence of sclerosing adenosis and ductal hyperplasia.  Marland Kitchen BREAST SURGERY Left May 07, 2007   wide excision.  . cataract Right 2013  . CATARACT EXTRACTION W/PHACO Left 03/05/2016   Procedure: CATARACT EXTRACTION PHACO AND INTRAOCULAR LENS PLACEMENT (IOC);  Surgeon: Leandrew Koyanagi, MD;  Location: [place];  Service: Ophthalmology;  Laterality: Left;  . COLON SURGERY    . COLONOSCOPY  2004  . COLONOSCOPY N/A 05/14/2015   Procedure: COLONOSCOPY;  Surgeon: Manya Silvas, MD;  Location: [facility] ENDOSCOPY;  Service: Endoscopy;  Laterality: N/A;  . ERCP N/A   . ESOPHAGOGASTRODUODENOSCOPY N/A 05/14/2015   Procedure: ESOPHAGOGASTRODUODENOSCOPY (EGD);  Surgeon: Manya Silvas, MD;  Location: [facility] ENDOSCOPY;  Service: Endoscopy;  Laterality: N/A;  . EUS N/A 2011, 2012  . EYE SURGERY  2013  . GLAUCOMA SURGERY Right 2013  . LAPAROSCOPIC RIGHT COLECTOMY Right 06/22/2015   LAPAROSCOPIC RIGHT COLECTOMY;  Surgeon: Robert Bellow, MD;  Location: [facility] ORS; 1.6 cm tubular adenoma.  Marland Kitchen NECK SURGERY  2006   C3-4 fusion  . right breast cancer    . SAVORY DILATION N/A 05/14/2015   Procedure: SAVORY DILATION;  Surgeon: Manya Silvas, MD;  Location: [facility] ENDOSCOPY;  Service: Endoscopy;  Laterality:  N/A;  . TRABECULECTOMY Left 03/05/2016   Procedure: TRABECULECTOMY WITH [facility] AND EXPRESS SHUNT;  Surgeon: Leandrew Koyanagi, MD;  Location: [place];  Service: Ophthalmology;  Laterality: Left;  . TUBAL LIGATION      Family History  Problem Relation Age of Onset  . Breast cancer Mother 65  . Kidney cancer Neg Hx   . Bladder Cancer Neg Hx     Social History Social History   Tobacco Use  . Smoking status: Former Smoker    Packs/day: 1.00    Years: 35.00    Pack years: 35.00    Types: Cigarettes    Last attempt to quit: 12/23/2007    Years since quitting: 11.3  . Smokeless tobacco: Never Used  . Tobacco comment: Uses patch  Substance Use Topics  .  Alcohol use: Yes    Alcohol/week: 0.0 standard drinks    Comment: 1 glass wine/mo  . Drug use: No    Allergies  Allergen Reactions  . Influenza Vaccines Anaphylaxis  . Flu Virus Vaccine Other (See Comments)    Muscle spams  . Gabapentin Anxiety  . Sulfa Antibiotics Rash  . Tetracyclines & Related Rash    Current Outpatient Medications  Medication Sig Dispense Refill  . albuterol (PROVENTIL) (2.5 MG/3ML) 0.083% nebulizer solution Take 3 mLs (2.5 mg total) by nebulization every 6 (six) hours as needed for wheezing or shortness of breath. 75 mL 12  . albuterol (VENTOLIN HFA) 108 (90 Base) MCG/ACT inhaler Inhale 2 puffs into the lungs every 6 (six) hours as needed for wheezing or shortness of breath. 1 Inhaler 5  . ALPRAZolam (XANAX) 0.5 MG tablet Take 0.5-1 mg by mouth See admin instructions. Take 1 tablet in the AM and 1/2 tablet at bedtime.    . AZOPT 1 % ophthalmic suspension Place 1 drop into the left eye daily.   0  . benzonatate (TESSALON) 100 MG capsule Take 1 capsule (100 mg total) by mouth 3 (three) times daily. (Patient taking differently: Take 100 mg by mouth 3 (three) times daily as needed for cough. ) 30 capsule 1  . bisacodyl (DULCOLAX) 5 MG EC tablet Take 5 mg by mouth daily as needed for moderate constipation.    . Brimonidine Tartrate-Timolol (COMBIGAN OP) Apply 1 drop to eye 2 (two) times daily. Both eyes    . buPROPion (WELLBUTRIN SR) 150 MG 12 hr tablet Take 300 mg by mouth daily.     Marland Kitchen dextromethorphan 15 MG/5ML syrup Take 10 mLs by mouth 4 (four) times daily as needed for cough.    . escitalopram (LEXAPRO) 10 MG tablet Take 15 mg by mouth at bedtime.   0  . esomeprazole (NEXIUM) 40 MG capsule Take 40 mg by mouth daily.    Marland Kitchen estradiol-levonorgestrel (CLIMARA PRO) 0.045-0.015 MG/DAY apply 1 patch every week (Patient taking differently: Place 1 patch onto the skin once a week. apply 1 patch every week) 12 patch 4  . fluticasone furoate-vilanterol (BREO ELLIPTA) 100-25  MCG/INH AEPB Inhale 1 puff into the lungs daily. 1 each 5  . ipratropium (ATROVENT) 0.06 % nasal spray Place 2 sprays into both nostrils 4 (four) times daily as needed for rhinitis.    Marland Kitchen lidocaine-prilocaine (EMLA) cream Apply to the areola and cover with plastic wrap one hour prior to leaving for surgery. 5 g 0  . LUMIGAN 0.01 % SOLN Place 1 drop into both eyes at bedtime.   0  . mirabegron ER (MYRBETRIQ) 25 MG TB24 tablet Take  1 tablet (25 mg total) by mouth daily. 90 tablet 3  . nicotine polacrilex (NICORETTE) 2 MG gum Take 2 mg by mouth as needed for smoking cessation.    . ondansetron (ZOFRAN) 4 MG tablet Take 4 mg by mouth every 6 (six) hours as needed for nausea/vomiting.    . rosuvastatin (CRESTOR) 10 MG tablet Take 10 mg by mouth at bedtime.     . tamoxifen (NOLVADEX) 20 MG tablet Take 1 tablet (20 mg total) by mouth daily. 30 tablet 12  . Tiotropium Bromide Monohydrate (SPIRIVA RESPIMAT) 2.5 MCG/ACT AERS Inhale 2 Inhalers into the lungs daily. (Patient taking differently: Inhale 2 puffs into the lungs daily. ) 1 Inhaler 5   No current facility-administered medications for this visit.     Review of Systems Review of Systems  Constitutional: Negative.   Respiratory: Negative.   Cardiovascular: Negative.     Blood pressure (!) 166/72, pulse 74, temperature 98.1 F (36.7 C), temperature source Temporal, resp. rate 18, height 5\' 5"  (1.651 m), weight 105 lb (47.6 kg), SpO2 96 %.  Physical Exam Physical Exam Constitutional:      Appearance: She is well-developed.  Eyes:     General: No scleral icterus.    Conjunctiva/sclera: Conjunctivae normal.  Neck:     Musculoskeletal: Normal range of motion and neck supple.  Cardiovascular:     Rate and Rhythm: Normal rate and regular rhythm.     Heart sounds: Normal heart sounds.  Pulmonary:     Effort: Pulmonary effort is normal.     Breath sounds: Normal breath sounds. Decreased air movement (distant breath sounds bilaterally.)  present.  Chest:     Breasts:        Right: No inverted nipple, mass, nipple discharge, skin change or tenderness.        Left: No inverted nipple, mass, nipple discharge, skin change or tenderness.  Lymphadenopathy:     Cervical: No cervical adenopathy.  Skin:    General: Skin is warm and dry.  Neurological:     Mental Status: She is alert and oriented to person, place, and time.     Data Reviewed February 09, 2019 biopsy: A. BREAST, LEFT, UPPER OUTER QUADRANT; STEREOTACTIC-GUIDED CORE BIOPSY:  - INVASIVE LOBULAR CARCINOMA.  - DUCTAL CARCINOMA IN SITU, INTERMEDIATE NUCLEAR GRADE WITH FOCAL  NECROSIS, ASSOCIATED WITH CALCIFICATIONS.  B. BREAST, LEFT, LOWER INNER QUADRANT; STEREOTACTIC-GUIDED CORE BIOPSY:  - FLAT EPITHELIAL ATYPIA CONTAINING CALCIFICATIONS.  - NO IN SITU OR INVASIVE CARCINOMA SEEN IN THIS SAMPLE  Assessment Left breast cancer.  Plan  Based on the modest breast volume and the secondary site with ADH, it is prudent to proceed to mastectomy.  Indications for sentinel node biopsy reviewed.  Left mastectomy scheduled for 05/09/2019.She is aware to use the EMLA cream one hour prior to leaving for surgery.  The patient is aware to call back for any questions or concerns.  .   HPI, Physical Exam, Assessment and Plan have been scribed under the direction and in the presence of Robert Bellow, MD. Jonnie Finner, CMA  I have completed the exam and reviewed the above documentation for accuracy and completeness.  I agree with the above.  Haematologist has been used and any errors in dictation or transcription are unintentional.  Hervey Ard, M.D., F.A.C.S.  Forest Gleason Wilmer Santillo 05/04/2019, 8:01 PM  Surgery scheduled for 05-09-19 at [facility] with Dr. Bary Castilla. Instructions reviewed. Patient reminded to have COVID-19 testing this afternoon. Patient verbalizes understanding. She was  instructed to call the office should she have further questions.   Dominga Ferry,  CMA

## 2019-05-04 LAB — NOVEL CORONAVIRUS, NAA (HOSP ORDER, SEND-OUT TO REF LAB; TAT 18-24 HRS): SARS-CoV-2, NAA: NOT DETECTED

## 2019-05-06 ENCOUNTER — Encounter: Payer: Self-pay | Admitting: *Deleted

## 2019-05-06 NOTE — Progress Notes (Signed)
  Oncology Nurse Navigator Documentation  Navigator Location: [facility]c (05/06/19 1100)   )Navigator Encounter Type: Telephone (05/06/19 1100) Telephone: [place] Call (05/06/19 1100)     Surgery Date: 05/09/19 (05/06/19 1100)                                            Time Spent with Patient: 60 (05/06/19 1100)   Patient called with questions regarding her surgery on Monday.  Sent Dr. Bary Castilla a message and he contacted the patient.  All questions were answered.  Will meet with patient day of surgery on Monday 05/09/19.

## 2019-05-09 ENCOUNTER — Ambulatory Visit
Admission: RE | Admit: 2019-05-09 | Discharge: 2019-05-09 | Disposition: A | Discharge status: Home / Self Care | Payer: Medicare Other | Source: Ambulatory Visit | Attending: General Surgery | Admitting: General Surgery

## 2019-05-09 ENCOUNTER — Encounter: Payer: Self-pay | Admitting: *Deleted

## 2019-05-09 ENCOUNTER — Ambulatory Visit: Payer: Medicare Other | Admitting: Anesthesiology

## 2019-05-09 ENCOUNTER — Ambulatory Visit
Admission: RE | Admit: 2019-05-09 | Discharge: 2019-05-09 | Disposition: A | Discharge status: Home / Self Care | Payer: Medicare Other | Attending: General Surgery | Admitting: General Surgery

## 2019-05-09 ENCOUNTER — Encounter
Admission: RE | Disposition: A | Discharge status: Home / Self Care | Payer: Self-pay | Source: Home / Self Care | Attending: General Surgery

## 2019-05-09 ENCOUNTER — Other Ambulatory Visit: Payer: Self-pay

## 2019-05-09 DIAGNOSIS — E785 Hyperlipidemia, unspecified: Secondary | ICD-10-CM | POA: Insufficient documentation

## 2019-05-09 DIAGNOSIS — Z853 Personal history of malignant neoplasm of breast: Secondary | ICD-10-CM | POA: Insufficient documentation

## 2019-05-09 DIAGNOSIS — Z803 Family history of malignant neoplasm of breast: Secondary | ICD-10-CM | POA: Diagnosis not present

## 2019-05-09 DIAGNOSIS — Z79890 Hormone replacement therapy (postmenopausal): Secondary | ICD-10-CM | POA: Insufficient documentation

## 2019-05-09 DIAGNOSIS — Z79899 Other long term (current) drug therapy: Secondary | ICD-10-CM | POA: Insufficient documentation

## 2019-05-09 DIAGNOSIS — Z79810 Long term (current) use of selective estrogen receptor modulators (SERMs): Secondary | ICD-10-CM | POA: Diagnosis not present

## 2019-05-09 DIAGNOSIS — H409 Unspecified glaucoma: Secondary | ICD-10-CM | POA: Insufficient documentation

## 2019-05-09 DIAGNOSIS — M1990 Unspecified osteoarthritis, unspecified site: Secondary | ICD-10-CM | POA: Insufficient documentation

## 2019-05-09 DIAGNOSIS — C50812 Malignant neoplasm of overlapping sites of left female breast: Secondary | ICD-10-CM | POA: Diagnosis not present

## 2019-05-09 DIAGNOSIS — Z170 Estrogen receptor positive status [ER+]: Secondary | ICD-10-CM

## 2019-05-09 DIAGNOSIS — Z881 Allergy status to other antibiotic agents status: Secondary | ICD-10-CM | POA: Diagnosis not present

## 2019-05-09 DIAGNOSIS — F3181 Bipolar II disorder: Secondary | ICD-10-CM | POA: Diagnosis not present

## 2019-05-09 DIAGNOSIS — J449 Chronic obstructive pulmonary disease, unspecified: Secondary | ICD-10-CM | POA: Diagnosis not present

## 2019-05-09 DIAGNOSIS — I129 Hypertensive chronic kidney disease with stage 1 through stage 4 chronic kidney disease, or unspecified chronic kidney disease: Secondary | ICD-10-CM | POA: Diagnosis not present

## 2019-05-09 DIAGNOSIS — Z882 Allergy status to sulfonamides status: Secondary | ICD-10-CM | POA: Insufficient documentation

## 2019-05-09 DIAGNOSIS — C50411 Malignant neoplasm of upper-outer quadrant of right female breast: Secondary | ICD-10-CM

## 2019-05-09 DIAGNOSIS — Z888 Allergy status to other drugs, medicaments and biological substances status: Secondary | ICD-10-CM | POA: Diagnosis not present

## 2019-05-09 DIAGNOSIS — C50512 Malignant neoplasm of lower-outer quadrant of left female breast: Secondary | ICD-10-CM | POA: Diagnosis not present

## 2019-05-09 DIAGNOSIS — C50912 Malignant neoplasm of unspecified site of left female breast: Secondary | ICD-10-CM

## 2019-05-09 DIAGNOSIS — Z87891 Personal history of nicotine dependence: Secondary | ICD-10-CM | POA: Insufficient documentation

## 2019-05-09 DIAGNOSIS — C50412 Malignant neoplasm of upper-outer quadrant of left female breast: Secondary | ICD-10-CM

## 2019-05-09 DIAGNOSIS — N189 Chronic kidney disease, unspecified: Secondary | ICD-10-CM | POA: Insufficient documentation

## 2019-05-09 DIAGNOSIS — Z7951 Long term (current) use of inhaled steroids: Secondary | ICD-10-CM | POA: Diagnosis not present

## 2019-05-09 HISTORY — PX: SIMPLE MASTECTOMY WITH AXILLARY SENTINEL NODE BIOPSY: SHX6098

## 2019-05-09 HISTORY — DX: Malignant neoplasm of upper-outer quadrant of left female breast: C50.412

## 2019-05-09 SURGERY — SIMPLE MASTECTOMY
Anesthesia: General | Site: Breast | Laterality: Left

## 2019-05-09 MED ORDER — KETOROLAC TROMETHAMINE 30 MG/ML IJ SOLN
INTRAMUSCULAR | Status: AC
  Filled 2019-05-09: qty 1

## 2019-05-09 MED ORDER — TECHNETIUM TC 99M SULFUR COLLOID FILTERED
0.7350 | Freq: Once | INTRAVENOUS | Status: AC | PRN
  Administered 2019-05-09: 12:00:00 0.735 via INTRADERMAL

## 2019-05-09 MED ORDER — FAMOTIDINE 20 MG PO TABS: 20.0000 mg | ORAL_TABLET | Freq: Once | ORAL | Status: DC

## 2019-05-09 MED ORDER — LACTATED RINGERS IV SOLN
INTRAVENOUS | Status: DC
  Administered 2019-05-09: 13:00:00 via INTRAVENOUS

## 2019-05-09 MED ORDER — PROPOFOL 10 MG/ML IV BOLUS
INTRAVENOUS | Status: DC | PRN
  Administered 2019-05-09: 100 mg via INTRAVENOUS
  Administered 2019-05-09: 30 mg via INTRAVENOUS
  Administered 2019-05-09: 50 mg via INTRAVENOUS
  Administered 2019-05-09: 20 mg via INTRAVENOUS

## 2019-05-09 MED ORDER — EPHEDRINE SULFATE 50 MG/ML IJ SOLN
INTRAMUSCULAR | Status: AC
  Filled 2019-05-09: qty 1

## 2019-05-09 MED ORDER — ONDANSETRON HCL 4 MG/2ML IJ SOLN: 4.0000 mg | Freq: Once | INTRAMUSCULAR | Status: DC | PRN

## 2019-05-09 MED ORDER — HYDROCODONE-ACETAMINOPHEN 5-325 MG PO TABS: 1.0000 | ORAL_TABLET | ORAL | 0 refills | Status: DC | PRN

## 2019-05-09 MED ORDER — FENTANYL CITRATE (PF) 250 MCG/5ML IJ SOLN
INTRAMUSCULAR | Status: AC
  Filled 2019-05-09: qty 5

## 2019-05-09 MED ORDER — LIDOCAINE HCL (CARDIAC) PF 100 MG/5ML IV SOSY
PREFILLED_SYRINGE | INTRAVENOUS | Status: DC | PRN
  Administered 2019-05-09: 100 mg via INTRAVENOUS

## 2019-05-09 MED ORDER — PROPOFOL 10 MG/ML IV BOLUS
INTRAVENOUS | Status: AC
  Filled 2019-05-09: qty 20

## 2019-05-09 MED ORDER — KETOROLAC TROMETHAMINE 30 MG/ML IJ SOLN
INTRAMUSCULAR | Status: DC | PRN
  Administered 2019-05-09: 15 mg via INTRAVENOUS

## 2019-05-09 MED ORDER — ACETAMINOPHEN 10 MG/ML IV SOLN
INTRAVENOUS | Status: DC | PRN
  Administered 2019-05-09: 750 mg via INTRAVENOUS

## 2019-05-09 MED ORDER — FENTANYL CITRATE (PF) 100 MCG/2ML IJ SOLN
25.0000 ug | INTRAMUSCULAR | Status: DC | PRN
  Administered 2019-05-09 (×3): 25 ug via INTRAVENOUS

## 2019-05-09 MED ORDER — METHYLENE BLUE 0.5 % INJ SOLN
INTRAVENOUS | Status: DC | PRN
  Administered 2019-05-09: 3 mL via SUBMUCOSAL

## 2019-05-09 MED ORDER — METHYLENE BLUE 0.5 % INJ SOLN
INTRAVENOUS | Status: AC
  Filled 2019-05-09: qty 10

## 2019-05-09 MED ORDER — DEXAMETHASONE SODIUM PHOSPHATE 10 MG/ML IJ SOLN
INTRAMUSCULAR | Status: DC | PRN
  Administered 2019-05-09: 5 mg via INTRAVENOUS

## 2019-05-09 MED ORDER — HYDRALAZINE HCL 20 MG/ML IJ SOLN
INTRAMUSCULAR | Status: AC
  Filled 2019-05-09: qty 1

## 2019-05-09 MED ORDER — FENTANYL CITRATE (PF) 100 MCG/2ML IJ SOLN
INTRAMUSCULAR | Status: AC
  Administered 2019-05-09: 15:00:00 25 ug via INTRAVENOUS
  Filled 2019-05-09: qty 2

## 2019-05-09 MED ORDER — ONDANSETRON HCL 4 MG/2ML IJ SOLN
INTRAMUSCULAR | Status: AC
  Filled 2019-05-09: qty 2

## 2019-05-09 MED ORDER — DEXAMETHASONE SODIUM PHOSPHATE 10 MG/ML IJ SOLN
INTRAMUSCULAR | Status: AC
  Filled 2019-05-09: qty 1

## 2019-05-09 MED ORDER — BUPIVACAINE-EPINEPHRINE (PF) 0.5% -1:200000 IJ SOLN
INTRAMUSCULAR | Status: AC
  Filled 2019-05-09: qty 30

## 2019-05-09 MED ORDER — ONDANSETRON HCL 4 MG/2ML IJ SOLN
INTRAMUSCULAR | Status: DC | PRN
  Administered 2019-05-09: 4 mg via INTRAVENOUS

## 2019-05-09 MED ORDER — ACETAMINOPHEN 10 MG/ML IV SOLN
INTRAVENOUS | Status: AC
  Filled 2019-05-09: qty 100

## 2019-05-09 MED ORDER — FENTANYL CITRATE (PF) 100 MCG/2ML IJ SOLN
INTRAMUSCULAR | Status: DC | PRN
  Administered 2019-05-09 (×6): 25 ug via INTRAVENOUS

## 2019-05-09 MED ORDER — LIDOCAINE HCL (PF) 2 % IJ SOLN
INTRAMUSCULAR | Status: AC
  Filled 2019-05-09: qty 10

## 2019-05-09 SURGICAL SUPPLY — 53 items
APPLIER CLIP 11 MED OPEN (CLIP)
APPLIER CLIP 13 LRG OPEN (CLIP)
BINDER BREAST LRG (GAUZE/BANDAGES/DRESSINGS) IMPLANT
BINDER BREAST MEDIUM (GAUZE/BANDAGES/DRESSINGS) ×2 IMPLANT
BINDER BREAST XLRG (GAUZE/BANDAGES/DRESSINGS) IMPLANT
BINDER BREAST XXLRG (GAUZE/BANDAGES/DRESSINGS) IMPLANT
BLADE PHOTON ILLUMINATED (MISCELLANEOUS) ×2 IMPLANT
BLADE SURG 15 STRL SS SAFETY (BLADE) ×2 IMPLANT
BULB RESERV EVAC DRAIN JP 100C (MISCELLANEOUS) ×2 IMPLANT
CANISTER SUCT 1200ML W/VALVE (MISCELLANEOUS) ×2 IMPLANT
CHLORAPREP W/TINT 26 (MISCELLANEOUS) ×2 IMPLANT
CLIP APPLIE 11 MED OPEN (CLIP) IMPLANT
CLIP APPLIE 13 LRG OPEN (CLIP) IMPLANT
CNTNR SPEC 2.5X3XGRAD LEK (MISCELLANEOUS) ×3
CONT SPEC 4OZ STER OR WHT (MISCELLANEOUS) ×3
CONTAINER SPEC 2.5X3XGRAD LEK (MISCELLANEOUS) ×3 IMPLANT
COVER PROBE FLX POLY STRL (MISCELLANEOUS) ×2 IMPLANT
COVER WAND RF STERILE (DRAPES) ×2 IMPLANT
DEVICE DUBIN SPECIMEN MAMMOGRA (MISCELLANEOUS) IMPLANT
DRAIN CHANNEL JP 15F RND 16 (MISCELLANEOUS) ×2 IMPLANT
DRAPE LAPAROTOMY TRNSV 106X77 (MISCELLANEOUS) ×2 IMPLANT
DRSG GAUZE FLUFF 36X18 (GAUZE/BANDAGES/DRESSINGS) ×4 IMPLANT
DRSG TELFA 3X8 NADH (GAUZE/BANDAGES/DRESSINGS) ×2 IMPLANT
ELECT CAUTERY BLADE TIP 2.5 (TIP) ×2
ELECT REM PT RETURN 9FT ADLT (ELECTROSURGICAL) ×2
ELECTRODE CAUTERY BLDE TIP 2.5 (TIP) ×1 IMPLANT
ELECTRODE REM PT RTRN 9FT ADLT (ELECTROSURGICAL) ×1 IMPLANT
GAUZE SPONGE 4X4 12PLY STRL (GAUZE/BANDAGES/DRESSINGS) ×2 IMPLANT
GLOVE BIO SURGEON STRL SZ7.5 (GLOVE) ×4 IMPLANT
GLOVE INDICATOR 8.0 STRL GRN (GLOVE) ×2 IMPLANT
GOWN STRL REUS W/ TWL LRG LVL3 (GOWN DISPOSABLE) ×2 IMPLANT
GOWN STRL REUS W/TWL LRG LVL3 (GOWN DISPOSABLE) ×2
KIT TURNOVER KIT A (KITS) ×2 IMPLANT
LABEL OR SOLS (LABEL) ×2 IMPLANT
PACK BASIN MINOR ARMC (MISCELLANEOUS) ×2 IMPLANT
PIN SAFETY STRL (MISCELLANEOUS) ×2 IMPLANT
RETRACTOR RING XSMALL (MISCELLANEOUS) IMPLANT
RTRCTR WOUND ALEXIS 13CM XS SH (MISCELLANEOUS)
SHEARS FOC LG CVD HARMONIC 17C (MISCELLANEOUS) IMPLANT
SLEVE PROBE SENORX GAMMA FIND (MISCELLANEOUS) ×2 IMPLANT
SPONGE LAP 18X18 RF (DISPOSABLE) ×2 IMPLANT
STRIP CLOSURE SKIN 1/2X4 (GAUZE/BANDAGES/DRESSINGS) ×4 IMPLANT
SUT ETHILON 3-0 FS-10 30 BLK (SUTURE) ×2
SUT SILK 2 0 (SUTURE) ×1
SUT SILK 2-0 30XBRD TIE 12 (SUTURE) ×1 IMPLANT
SUT VIC AB 2-0 CT1 27 (SUTURE) ×3
SUT VIC AB 2-0 CT1 TAPERPNT 27 (SUTURE) ×3 IMPLANT
SUT VIC AB 3-0 SH 27 (SUTURE) ×1
SUT VIC AB 3-0 SH 27X BRD (SUTURE) ×1 IMPLANT
SUT VICRYL+ 3-0 144IN (SUTURE) ×2 IMPLANT
SUTURE EHLN 3-0 FS-10 30 BLK (SUTURE) ×1 IMPLANT
SWABSTK COMLB BENZOIN TINCTURE (MISCELLANEOUS) ×2 IMPLANT
TAPE TRANSPORE STRL 2 31045 (GAUZE/BANDAGES/DRESSINGS) ×2 IMPLANT

## 2019-05-09 NOTE — H&P (Signed)
No change in clinical condition or breast exam since last OV. For left simple mastectomy and SLN biopsy.

## 2019-05-09 NOTE — Discharge Instructions (Addendum)

## 2019-05-09 NOTE — Transfer of Care (Signed)
Immediate Anesthesia Transfer of Care Note  Patient: Elaine Clayton  Procedure(s) Performed: LEFT SIMPLE MASTECTOMY WITH SENTINEL LYMPH NODE BIOPSY LEFT - Verified injection at 11:00 (Left Breast)  Patient Location: PACU  Anesthesia Type:General  Level of Consciousness: sedated  Airway & Oxygen Therapy: Patient Spontanous Breathing and Patient connected to face mask oxygen  Post-op Assessment: Report given to RN and Post -op Vital signs reviewed and stable  Post vital signs: Reviewed and stable  Last Vitals:  Vitals Value Taken Time  BP 152/62 05/09/2019  2:48 PM  Temp 36.4 C 05/09/2019  2:48 PM  Pulse 69 05/09/2019  2:48 PM  Resp 12 05/09/2019  2:48 PM  SpO2 100 % 05/09/2019  2:48 PM  Vitals shown include unvalidated device data.  Last Pain:  Vitals:   05/09/19 1448  TempSrc:   PainSc: 0-No pain         Complications: No apparent anesthesia complications

## 2019-05-09 NOTE — Anesthesia Procedure Notes (Signed)
Procedure Name: LMA Insertion Date/Time: 05/09/2019 1:07 PM Performed by: Hedda Slade, CRNA Pre-anesthesia Checklist: Patient identified, Patient being monitored, Timeout performed, Emergency Drugs available and Suction available Patient Re-evaluated:Patient Re-evaluated prior to induction Oxygen Delivery Method: Circle system utilized Preoxygenation: Pre-oxygenation with 100% oxygen Induction Type: IV induction Ventilation: Mask ventilation without difficulty LMA: LMA inserted LMA Size: 3.5 Tube type: Oral Number of attempts: 1 Placement Confirmation: positive ETCO2 and breath sounds checked- equal and bilateral Tube secured with: Tape Dental Injury: Teeth and Oropharynx as per pre-operative assessment

## 2019-05-09 NOTE — Anesthesia Post-op Follow-up Note (Signed)
Anesthesia QCDR form completed.        

## 2019-05-09 NOTE — Op Note (Signed)
Preoperative diagnosis: Invasive mammary carcinoma of the left breast.  Postoperative diagnosis: Same.  Operative procedure: Left simple mastectomy with sentinel node biopsy.  Operating Surgeon: Hervey Ard, MD  Anesthesia: General by LMA.  Estimated blood loss: Less than 30 cc.  Clinical note: This 73 year old woman had an abnormal mammogram and stereotactic biopsy showed invasive mammary carcinoma in 1 section and ADH and a second.  Considering her very small breast volume it was thought she would be best managed by mastectomy.  She underwent injection with technetium sulfur colloid the morning of surgery.  Operative note: With the patient under adequate general anesthesia the breast was prepped with alcohol and a total of 3 cc of 0.5% methylene blue was injected in subareolar plexus.  ChloraPrep was then applied to the skin.  An elliptical and incision was outlined.  The skin was incised sharply and the remaining dissection completed with the photon blade.  Flaps were elevated to the sternum medially, clavicle superiorly, pectoralis fascia laterally and rectus fascia inferiorly.  The axillary envelope was opened and 2 hot, blue nodes were identified.  These were sent together sentinel node #1 and 2.  High in the axilla a small hot, non-blue node was identified and this was sent with the first 2 for permanent section.  The breast was removed from the underlying pectoralis muscle taking the fascia with the specimen.  It was orientated and sent fresh to pathology per protocol.  The wound was irrigated with sterile water.  Final hemostasis was obtained.  A 15 [place] Blake drain was brought out through the medial inferior flap.  This was anchored into position with a 3-0 nylon suture.  The skin flaps were approximated with a running 2-0 Vicryl suture in 2 segments.  Benzoin and Steri-Strips followed by Telfa dressing, fluff gauze and and a compressive wrap were applied.  Patient tolerated the  procedure well and was taken recovery in stable condition.

## 2019-05-09 NOTE — Anesthesia Preprocedure Evaluation (Addendum)
Anesthesia Evaluation  Patient identified by MRN, date of birth, ID band Patient awake    Reviewed: Allergy & Precautions, NPO status , Patient's Chart, lab work & pertinent test results, reviewed documented beta blocker date and time   Airway Mallampati: II  TM Distance: >3 FB     Dental  (+) Chipped   Pulmonary COPD, former smoker,           Cardiovascular hypertension, Pt. on medications      Neuro/Psych PSYCHIATRIC DISORDERS Bipolar Disorder  Neuromuscular disease    GI/Hepatic PUD,   Endo/Other  Hyperthyroidism   Renal/GU      Musculoskeletal  (+) Arthritis ,   Abdominal   Peds  Hematology  (+) anemia ,   Anesthesia Other Findings Healthy pt. Does not want any particular blocks. This is a simple mastectomy.  Reproductive/Obstetrics                           Anesthesia Physical Anesthesia Plan  ASA: II  Anesthesia Plan: General   Post-op Pain Management:    Induction: Intravenous  PONV Risk Score and Plan:   Airway Management Planned: LMA  Additional Equipment:   Intra-op Plan:   Post-operative Plan:   Informed Consent: I have reviewed the patients History and Physical, chart, labs and discussed the procedure including the risks, benefits and alternatives for the proposed anesthesia with the patient or authorized representative who has indicated his/her understanding and acceptance.       Plan Discussed with: CRNA  Anesthesia Plan Comments:         Anesthesia Quick Evaluation

## 2019-05-10 ENCOUNTER — Encounter: Payer: Self-pay | Admitting: General Surgery

## 2019-05-10 NOTE — Anesthesia Postprocedure Evaluation (Signed)
Anesthesia Post Note  Patient: Elaine Clayton  Procedure(s) Performed: LEFT SIMPLE MASTECTOMY WITH SENTINEL LYMPH NODE BIOPSY LEFT - Verified injection at 11:00 (Left Breast)  Patient location during evaluation: PACU Anesthesia Type: General Level of consciousness: awake and alert Pain management: pain level controlled Vital Signs Assessment: post-procedure vital signs reviewed and stable Respiratory status: spontaneous breathing, nonlabored ventilation and respiratory function stable Cardiovascular status: blood pressure returned to baseline and stable Postop Assessment: no apparent nausea or vomiting Anesthetic complications: no     Last Vitals:  Vitals:   05/09/19 1600 05/09/19 1626  BP:  (!) 154/78  Pulse: 92 91  Resp: 17 16  Temp: 37 C 37 C  SpO2: 92% 93%    Last Pain:  Vitals:   05/10/19 0750  TempSrc:   PainSc: 0-No pain                 Durenda Hurt

## 2019-05-11 ENCOUNTER — Encounter: Payer: Self-pay | Admitting: *Deleted

## 2019-05-11 NOTE — Progress Notes (Signed)
  Oncology Nurse Navigator Documentation  Navigator Location: [facility] (05/11/19 1000)   )Navigator Encounter Type: Clinic/MDC (05/11/19 1000)       Surgery Date: 05/09/19 (05/11/19 1000)             Patient Visit Type: Surgery (05/11/19 1000)   Barriers/Navigation Needs: No barriers at this time (05/11/19 1000)                          Time Spent with Patient: 45 (05/11/19 1000)   Met patient prior to her surgery.  Checked on her and her husband multiple times during the day.  No needs at this time.

## 2019-05-11 NOTE — Progress Notes (Signed)
  Oncology Nurse Navigator Documentation  Navigator Location: [facility] (05/11/19 1019)   )Navigator Encounter Type: Telephone (05/11/19 1019) Telephone: Outgoing Call (05/11/19 1019)                       Barriers/Navigation Needs: No barriers at this time (05/11/19 1019)                          Time Spent with Patient: 15 (05/11/19 1019)   Called patient post op to follow up to see how she is doing.  Talked to patient's husband.  She is using Tylenol for pain with good relief.  No needs at this time.  To call if they have any questions or needs.

## 2019-05-12 ENCOUNTER — Other Ambulatory Visit: Payer: Self-pay

## 2019-05-12 ENCOUNTER — Ambulatory Visit (INDEPENDENT_AMBULATORY_CARE_PROVIDER_SITE_OTHER): Payer: Medicare Other

## 2019-05-12 DIAGNOSIS — C50912 Malignant neoplasm of unspecified site of left female breast: Secondary | ICD-10-CM

## 2019-05-12 LAB — SURGICAL PATHOLOGY

## 2019-05-12 NOTE — Progress Notes (Signed)
Patient ID: Elaine Clayton, female   DOB: 1946-07-04, 73 y.o.   MRN: [id number]  Patient came in today for a wound check post left mastectomy. The wound is clean, with no signs of infection noted. Dressing removed and area cleansed and new dressing applied with gauze fluff. Drain sheet present, greater than 60 ml drainage for past two days. Drain care instruction given. Follow up as scheduled on Tuesday.

## 2019-05-17 ENCOUNTER — Other Ambulatory Visit: Payer: Self-pay

## 2019-05-17 ENCOUNTER — Encounter: Payer: Self-pay | Admitting: General Surgery

## 2019-05-17 ENCOUNTER — Other Ambulatory Visit: Payer: Self-pay | Admitting: Urology

## 2019-05-17 ENCOUNTER — Ambulatory Visit (INDEPENDENT_AMBULATORY_CARE_PROVIDER_SITE_OTHER): Payer: Medicare Other | Admitting: General Surgery

## 2019-05-17 VITALS — BP 169/68 | HR 80 | Temp 98.6°F | Ht 65.0 in | Wt 104.2 lb

## 2019-05-17 DIAGNOSIS — C50912 Malignant neoplasm of unspecified site of left female breast: Secondary | ICD-10-CM

## 2019-05-17 MED ORDER — MIRABEGRON ER 25 MG PO TB24: 25.0000 mg | ORAL_TABLET | Freq: Every day | ORAL | 3 refills | Status: DC

## 2019-05-17 NOTE — Telephone Encounter (Signed)
Spoke to patient and informed her the RX was sent to pharmacy in April. She informed me that they are using a different Pharmacy. The RX was changed to the correct pharmacy

## 2019-05-17 NOTE — Progress Notes (Signed)
Patient ID: Elaine Clayton, female   DOB: 1946/03/30, 73 y.o.   MRN: [id number]  Chief Complaint  Patient presents with  . Routine Post Op    post op LEFT SIMPLE MASTECTOMY WITH SENTINEL LYMPH NODE BIOPSY LEFT -Dr. Bary Castilla 05/09/2019    HPI Elaine Clayton is a 73 y.o. female.  Here for her postoperative visit, left mastectomy on 05/09/2019. She is here with her husband, [name]. The patient reports that the nuclear injection the morning of surgery went well with her use of EMLA cream.  She took 1 narcotic tablet postop, intermittent Tylenol since.  Spirits have been good.  HPI  Past Medical History:  Diagnosis Date  . Anemia   . Ataxia    Apaxia  . Bipolar affective disorder ([place])    2  . Breast cancer of upper-outer quadrant of left female breast ([place]) 05/09/2019   LEFT; Invasive LOBULAR carcinoma,17 mm, T1c, N0, multifocal, lymphovascular invasion noted.  ER: 90%; PR: Neg, Her 2 neu not overexpressed.   . Broken foot 2015   right foot  . Chronic gastritis   . Chronic kidney disease    Dr Holley Raring  . COPD (chronic obstructive pulmonary disease) ([place])   . Dizziness    inner ear (per pt) several times per week  . Dysphagia   . Falls frequently   . Glaucoma   . Hyperlipemia   . Hypertension    pt has recently come off BP meds.  MD aware. BP seems stable.  . Hyperthyroidism   . Lithium toxicity 2015  . Malignant neoplasm of upper-outer quadrant of female breast ([place]) 05/07/2007   RIGHT: tubular carcinoma, 1 mm, T1a,Nx, Wide excision, whole breast radiation.   . Memory difficulty   . Neck stiffness    s/p C3-C4 fusion, limited right turn  . Osteoarthritis    back  . Pancreatitis   . Parkinson disease ([place])   . Pituitary microadenoma ([place])   . Recurrent UTI   . Renal insufficiency   . Stomach ulcer 2112    Past Surgical History:  Procedure Laterality Date  . APPENDECTOMY    . BREAST BIOPSY Right 2011   neg  . BREAST BIOPSY Left 02/09/2019   affirm stereo 2  areas/group 1 x clip/ group 2 coil clip/path pending  . BREAST EXCISIONAL BIOPSY Right 2001   neg  . 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 BREAST EXCISIONAL BIOPSY Left 10/26/2012   neg  . BREAST SURGERY Left  2013   left breast wide excision,Intraductal papilloma, ductal hyperplasia and sclerosing adenosis. Microcalcifications associated with columnar cell change. No evidence of atypia or malignancy. Margins are unremarkable.  Marland Kitchen BREAST SURGERY Right November 06, 1999   multiple areas of microcalcification showing evidence of sclerosing adenosis and ductal hyperplasia.  Marland Kitchen BREAST SURGERY Left May 07, 2007   wide excision.  . cataract Right 2013  . CATARACT EXTRACTION W/PHACO Left 03/05/2016   Procedure: CATARACT EXTRACTION PHACO AND INTRAOCULAR LENS PLACEMENT (IOC);  Surgeon: Leandrew Koyanagi, MD;  Location: [place];  Service: Ophthalmology;  Laterality: Left;  . COLON SURGERY    . COLONOSCOPY  2004  . COLONOSCOPY N/A 05/14/2015   Procedure: COLONOSCOPY;  Surgeon: Manya Silvas, MD;  Location: [facility] ENDOSCOPY;  Service: Endoscopy;  Laterality: N/A;  . ERCP N/A   . ESOPHAGOGASTRODUODENOSCOPY N/A 05/14/2015   Procedure: ESOPHAGOGASTRODUODENOSCOPY (EGD);  Surgeon: Manya Silvas, MD;  Location[facility] ENDOSCOPY;  Service:  Endoscopy;  Laterality: N/A;  . EUS N/A 2011, 2012  . EYE SURGERY  2013  . GLAUCOMA SURGERY Right 2013  . LAPAROSCOPIC RIGHT COLECTOMY Right 06/22/2015   LAPAROSCOPIC RIGHT COLECTOMY;  Surgeon: Robert Bellow, MD;  Location: [facility] ORS; 1.6 cm tubular adenoma.  Marland Kitchen NECK SURGERY  2006   C3-4 fusion  . right breast cancer    . SAVORY DILATION N/A 05/14/2015   Procedure: SAVORY DILATION;  Surgeon: Manya Silvas, MD;  Location: [facility] ENDOSCOPY;  Service: Endoscopy;  Laterality: N/A;  . SIMPLE MASTECTOMY WITH AXILLARY SENTINEL NODE BIOPSY Left 05/09/2019   Procedure: LEFT SIMPLE MASTECTOMY WITH SENTINEL LYMPH  NODE BIOPSY LEFT - Verified injection at 11:00;  Surgeon: Robert Bellow, MD;  Location: [facility] ORS;  Service: General;  Laterality: Left;  . TRABECULECTOMY Left 03/05/2016   Procedure: TRABECULECTOMY WITH [facility] AND EXPRESS SHUNT;  Surgeon: Leandrew Koyanagi, MD;  Location: [place];  Service: Ophthalmology;  Laterality: Left;  . TUBAL LIGATION      Family History  Problem Relation Age of Onset  . Breast cancer Mother 18  . Kidney cancer Neg Hx   . Bladder Cancer Neg Hx     Social History Social History   Tobacco Use  . Smoking status: Former Smoker    Packs/day: 1.00    Years: 35.00    Pack years: 35.00    Types: Cigarettes    Last attempt to quit: 12/23/2007    Years since quitting: 11.4  . Smokeless tobacco: Never Used  . Tobacco comment: Uses patch  Substance Use Topics  . Alcohol use: Yes    Alcohol/week: 0.0 standard drinks    Comment: 1 glass wine/mo  . Drug use: No    Allergies  Allergen Reactions  . Influenza Vaccines Anaphylaxis  . Flu Virus Vaccine Other (See Comments)    Muscle spams  . Gabapentin Anxiety  . Sulfa Antibiotics Rash  . Tetracyclines & Related Rash    Current Outpatient Medications  Medication Sig Dispense Refill  . albuterol (PROVENTIL) (2.5 MG/3ML) 0.083% nebulizer solution Take 3 mLs (2.5 mg total) by nebulization every 6 (six) hours as needed for wheezing or shortness of breath. 75 mL 12  . ALPRAZolam (XANAX) 0.5 MG tablet Take 0.5-1 mg by mouth See admin instructions. Take 1 tablet in the AM and 1/2 tablet at bedtime.    . AZOPT 1 % ophthalmic suspension Place 1 drop into the left eye daily.   0  . benzonatate (TESSALON) 100 MG capsule Take 1 capsule (100 mg total) by mouth 3 (three) times daily. (Patient taking differently: Take 100 mg by mouth 3 (three) times daily as needed for cough. ) 30 capsule 1  . bisacodyl (DULCOLAX) 5 MG EC tablet Take 5 mg by mouth daily as needed for moderate constipation.    . Brimonidine  Tartrate-Timolol (COMBIGAN OP) Apply 1 drop to eye 2 (two) times daily. Both eyes    . buPROPion (WELLBUTRIN SR) 150 MG 12 hr tablet Take 300 mg by mouth daily.     Marland Kitchen dextromethorphan 15 MG/5ML syrup Take 10 mLs by mouth 4 (four) times daily as needed for cough.    . escitalopram (LEXAPRO) 10 MG tablet Take 15 mg by mouth at bedtime.   0  . esomeprazole (NEXIUM) 40 MG capsule Take 40 mg by mouth daily.    Marland Kitchen estradiol-levonorgestrel (CLIMARA PRO) 0.045-0.015 MG/DAY apply 1 patch every week (Patient taking differently: Place 1 patch onto the skin once  a week. apply 1 patch every week) 12 patch 4  . fluticasone furoate-vilanterol (BREO ELLIPTA) 100-25 MCG/INH AEPB Inhale 1 puff into the lungs daily. 1 each 5  . ipratropium (ATROVENT) 0.06 % nasal spray Place 2 sprays into both nostrils 4 (four) times daily as needed for rhinitis.    Marland Kitchen lidocaine-prilocaine (EMLA) cream Apply to the areola and cover with plastic wrap one hour prior to leaving for surgery. 5 g 0  . LUMIGAN 0.01 % SOLN Place 1 drop into both eyes at bedtime.   0  . mirabegron ER (MYRBETRIQ) 25 MG TB24 tablet Take 1 tablet (25 mg total) by mouth daily. 90 tablet 3  . nicotine polacrilex (NICORETTE) 2 MG gum Take 2 mg by mouth as needed for smoking cessation.    . ondansetron (ZOFRAN) 4 MG tablet Take 4 mg by mouth every 6 (six) hours as needed for nausea/vomiting.    . rosuvastatin (CRESTOR) 10 MG tablet Take 10 mg by mouth at bedtime.     . tamoxifen (NOLVADEX) 20 MG tablet Take 1 tablet (20 mg total) by mouth daily. 30 tablet 12  . Tiotropium Bromide Monohydrate (SPIRIVA RESPIMAT) 2.5 MCG/ACT AERS Inhale 2 Inhalers into the lungs daily. (Patient taking differently: Inhale 2 puffs into the lungs daily. ) 1 Inhaler 5   No current facility-administered medications for this visit.     Review of Systems Review of Systems  Constitutional: Negative.   Respiratory: Negative.   Cardiovascular: Negative.     Blood pressure (!) 169/68,  pulse 80, temperature 98.6 F (37 C), temperature source Temporal, height _0  (1.651 m), weight 104 lb 3.2 oz (47.3 kg), SpO2 98 %.  Physical Exam Physical Exam Constitutional:      Appearance: Normal appearance.  Chest:       Comments: Drain removed Skin:    General: Skin is warm and dry.  Neurological:     Mental Status: She is alert. Mental status is at baseline.  Psychiatric:        Behavior: Behavior normal.     Data Reviewed A. BREAST, LEFT; MASTECTOMY:  - INVASIVE LOBULAR CARCINOMA, MULTIFOCAL, SEE COMMENT AND CASE SUMMARY  BELOW.  - TWO BIOPSY SITES AND MARKER CLIPS IDENTIFIED.   B. SENTINEL LYMPH NODES 1, 2, AND 3, LEFT AXILLA; EXCISION:  - NEGATIVE FOR MALIGNANCY, FIVE LYMPH NODES (0/5).   CANCER CASE SUMMARY: INVASIVE CARCINOMA OF THE BREAST  Procedure: Total mastectomy  Specimen Laterality: Left  Tumor Size: 17 mm  Histologic Type: Invasive lobular carcinoma  Histologic Grade (Nottingham Histologic Score)            Glandular (Acinar)/Tubular Differentiation: Score 3            Nuclear Pleomorphism: Score 2           Mitotic Rate: Score 1            Overall Grade: 2  Ductal Carcinoma In Situ (DCIS): Present, intermediate nuclear grade  with focal necrosis and calcifications.  Margins:            Invasive Carcinoma Margins: Uninvolved by invasive  carcinoma            Distance from closest margin: 5 mm            Specify closest margin: Posterior/deep             DCIS Margins: Uninvolved by DCIS            Distance from closest margin:  2 mm            Specify closest margin: Posterior/deep   Regional Lymph Nodes: Uninvolved by tumor cells            Total Number of Lymph Nodes Examined: 5            Number of Sentinel Nodes Examined: 5   Treatment Effect in the Breast: No known presurgical therapy  Lymphovascular  Invasion: Present   Pathologic Stage Classification (pTNM, AJCC 8th Edition): pT1c(m)  pN0(sn)  TNM Descriptor: m (multiple foci of invasive carcinoma)   Breast Biomarker Testing Performed on Previous Biopsy [id number],  collected 02/09/2019:  Estrogen Receptor (ER) Status: POSITIVE, >90%, strong staining  Progesterone Receptor (PgR) Status: NEGATIVE (<1%)  HER2 (by immunohistochemistry): NEGATIVE (Score 0)   Comment:  The biopsy site with X-shaped marker clip is identified in the slightly  upper outer quadrant, associated with invasive carcinoma, lymphovascular  invasion (LVI), and in situ carcinoma (biopsy site #1). The biopsy site  with coil-shaped marker clip is identified in the lower outer quadrant,  associated with atypical ductal hyperplasia and calcifications (biopsy  site #2). A section of grossly normal tissue between the two sites  contains a 1 mm focus of invasive carcinoma and nearby LVI. A separately  identified fibrous area located laterally between the biopsy sites shows  a 2 mm focus of invasive carcinoma. A section of grossly normal tissue  between biopsy site #1 and the fibrous area contains an area of  microscopic foci of invasive carcinoma and LVI spanning about 5 mm.   Immunohistochemistry (IHC) for cytokeratins (AE1/AE3/PCK26) was  performed on the sentinel lymph nodes to confirm the absence of  metastasis.   Drain record showed volumes less than 30 cc/day.  Drain removed without incident.  Assessment Multifocal invasive lobular carcinoma with lymphovascular invasion, node negative.  ER positive.  Plan  May apply neosporin to irritated spots. May shower The patient is aware to call back for any questions or new concerns. Return in one week Stay off Climara patch for one week and monitor symptoms  May use left arm as needed for daily activities, NO strenuous activity.   The patient would be a candidate for MammaPrint or Oncotype testing based on  tumor size.  The patient and her husband were asked was there any circumstance where she would consider adjuvant chemotherapy, if so testing would be appropriate.  If under no circumstances would she consider adjuvant treatment, it would not justify expense to complete additional testing.  They will mull this over and report back next week.  The possibility of fluid accumulation was reviewed and reassurance provided that this is not indicator of any disease state.  HPI, assessment, plan and physical exam has been scribed under the direction and in the presence of Robert Bellow, MD. Karie Fetch, RN  I have completed the exam and reviewed the above documentation for accuracy and completeness.  I agree with the above.  Haematologist has been used and any errors in dictation or transcription are unintentional.  Hervey Ard, M.D., F.A.C.S.  Forest Gleason Baran Kuhrt 05/19/2019, 4:34 PM

## 2019-05-17 NOTE — Patient Instructions (Addendum)
May apply neosporin to irritated spots. May shower The patient is aware to call back for any questions or new concerns. Return in one week May use left arm as needed for daily activities, NO strenuous activity  Stay off Climara patch for one week and monitor symptoms

## 2019-05-17 NOTE — Telephone Encounter (Signed)
Pt needs refill for Myrbetriq 25mg 

## 2019-05-19 ENCOUNTER — Encounter: Payer: Self-pay | Admitting: General Surgery

## 2019-05-24 ENCOUNTER — Other Ambulatory Visit: Payer: Self-pay

## 2019-05-24 ENCOUNTER — Encounter: Payer: Medicare Other | Admitting: General Surgery

## 2019-05-24 ENCOUNTER — Encounter: Payer: Self-pay | Admitting: General Surgery

## 2019-05-24 ENCOUNTER — Ambulatory Visit (INDEPENDENT_AMBULATORY_CARE_PROVIDER_SITE_OTHER): Payer: Medicare Other | Admitting: General Surgery

## 2019-05-24 VITALS — BP 124/81 | HR 80 | Temp 98.6°F | Ht 62.0 in | Wt 104.0 lb

## 2019-05-24 DIAGNOSIS — C50912 Malignant neoplasm of unspecified site of left female breast: Secondary | ICD-10-CM

## 2019-05-24 NOTE — Patient Instructions (Addendum)
Return in two weeks.  Apply neosporin or vaseline to areas on the chest wall.  OK to swim.

## 2019-05-24 NOTE — Progress Notes (Addendum)
Patient ID: Elaine Clayton, female   DOB: Apr 20, 1946, 73 y.o.   MRN: [id number]  Chief Complaint  Patient presents with  . Routine Post Op    left mastectomy     HPI Elaine Clayton is a 73 y.o. female Here for her postoperative visit, left mastectomy on 05/09/2019. She is here with her husband, Elaine Clayton. HPI  Past Medical History:  Diagnosis Date  . Anemia   . Ataxia    Apaxia  . Bipolar affective disorder ([place])    2  . Breast cancer of upper-outer quadrant of left female breast ([place]) 05/09/2019   LEFT; Invasive LOBULAR carcinoma,17 mm, T1c, N0, multifocal, lymphovascular invasion noted.  ER: 90%; PR: Neg, Her 2 neu not overexpressed.   . Broken foot 2015   right foot  . Chronic gastritis   . Chronic kidney disease    Dr Holley Raring  . COPD (chronic obstructive pulmonary disease) ([place])   . Dizziness    inner ear (per pt) several times per week  . Dysphagia   . Falls frequently   . Glaucoma   . Hyperlipemia   . Hypertension    pt has recently come off BP meds.  MD aware. BP seems stable.  . Hyperthyroidism   . Lithium toxicity 2015  . Malignant neoplasm of upper-outer quadrant of female breast ([place]) 05/07/2007   RIGHT: tubular carcinoma, 1 mm, T1a,Nx, Wide excision, whole breast radiation.   . Memory difficulty   . Neck stiffness    s/p C3-C4 fusion, limited right turn  . Osteoarthritis    back  . Pancreatitis   . Parkinson disease ([place])   . Pituitary microadenoma ([place])   . Recurrent UTI   . Renal insufficiency   . Stomach ulcer 2112    Past Surgical History:  Procedure Laterality Date  . APPENDECTOMY    . BREAST BIOPSY Right 2011   neg  . BREAST BIOPSY Left 02/09/2019   affirm stereo 2 areas/group 1 x clip/ group 2 coil clip/path pending  . BREAST EXCISIONAL BIOPSY Right 2001   neg  . 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 BREAST EXCISIONAL BIOPSY Left 10/26/2012   neg  . BREAST SURGERY Left  2013   left  breast wide excision,Intraductal papilloma, ductal hyperplasia and sclerosing adenosis. Microcalcifications associated with columnar cell change. No evidence of atypia or malignancy. Margins are unremarkable.  Marland Kitchen BREAST SURGERY Right November 06, 1999   multiple areas of microcalcification showing evidence of sclerosing adenosis and ductal hyperplasia.  Marland Kitchen BREAST SURGERY Left May 07, 2007   wide excision.  . cataract Right 2013  . CATARACT EXTRACTION W/PHACO Left 03/05/2016   Procedure: CATARACT EXTRACTION PHACO AND INTRAOCULAR LENS PLACEMENT (IOC);  Surgeon: Leandrew Koyanagi, MD;  Location: [place];  Service: Ophthalmology;  Laterality: Left;  . COLON SURGERY    . COLONOSCOPY  2004  . COLONOSCOPY N/A 05/14/2015   Procedure: COLONOSCOPY;  Surgeon: Manya Silvas, MD;  Location: [facility] ENDOSCOPY;  Service: Endoscopy;  Laterality: N/A;  . ERCP N/A   . ESOPHAGOGASTRODUODENOSCOPY N/A 05/14/2015   Procedure: ESOPHAGOGASTRODUODENOSCOPY (EGD);  Surgeon: Manya Silvas, MD;  Location: [facility] ENDOSCOPY;  Service: Endoscopy;  Laterality: N/A;  . EUS N/A 2011, 2012  . EYE SURGERY  2013  . GLAUCOMA SURGERY Right 2013  . LAPAROSCOPIC RIGHT COLECTOMY Right 06/22/2015   LAPAROSCOPIC RIGHT COLECTOMY;  Surgeon: Robert Bellow, MD;  Location: [facility] ORS; 1.6 cm tubular  adenoma.  Marland Kitchen NECK SURGERY  2006   C3-4 fusion  . right breast cancer    . SAVORY DILATION N/A 05/14/2015   Procedure: SAVORY DILATION;  Surgeon: Manya Silvas, MD;  Location: [facility] ENDOSCOPY;  Service: Endoscopy;  Laterality: N/A;  . SIMPLE MASTECTOMY WITH AXILLARY SENTINEL NODE BIOPSY Left 05/09/2019   Procedure: LEFT SIMPLE MASTECTOMY WITH SENTINEL LYMPH NODE BIOPSY LEFT - Verified injection at 11:00;  Surgeon: Robert Bellow, MD;  Location: [facility] ORS;  Service: General;  Laterality: Left;  . TRABECULECTOMY Left 03/05/2016   Procedure: TRABECULECTOMY WITH [facility] AND EXPRESS SHUNT;  Surgeon: Leandrew Koyanagi, MD;  Location: [place];  Service: Ophthalmology;  Laterality: Left;  . TUBAL LIGATION      Family History  Problem Relation Age of Onset  . Breast cancer Mother 25  . Kidney cancer Neg Hx   . Bladder Cancer Neg Hx     Social History Social History   Tobacco Use  . Smoking status: Former Smoker    Packs/day: 1.00    Years: 35.00    Pack years: 35.00    Types: Cigarettes    Last attempt to quit: 12/23/2007    Years since quitting: 11.4  . Smokeless tobacco: Never Used  . Tobacco comment: Uses patch  Substance Use Topics  . Alcohol use: Yes    Alcohol/week: 0.0 standard drinks    Comment: 1 glass wine/mo  . Drug use: No    Allergies  Allergen Reactions  . Influenza Vaccines Anaphylaxis  . Flu Virus Vaccine Other (See Comments)    Muscle spams  . Gabapentin Anxiety  . Sulfa Antibiotics Rash  . Tetracyclines & Related Rash    Current Outpatient Medications  Medication Sig Dispense Refill  . albuterol (PROVENTIL) (2.5 MG/3ML) 0.083% nebulizer solution Take 3 mLs (2.5 mg total) by nebulization every 6 (six) hours as needed for wheezing or shortness of breath. 75 mL 12  . ALPRAZolam (XANAX) 0.5 MG tablet Take 0.5-1 mg by mouth See admin instructions. Take 1 tablet in the AM and 1/2 tablet at bedtime.    . AZOPT 1 % ophthalmic suspension Place 1 drop into the left eye daily.   0  . benzonatate (TESSALON) 100 MG capsule Take 1 capsule (100 mg total) by mouth 3 (three) times daily. (Patient taking differently: Take 100 mg by mouth 3 (three) times daily as needed for cough. ) 30 capsule 1  . bisacodyl (DULCOLAX) 5 MG EC tablet Take 5 mg by mouth daily as needed for moderate constipation.    . Brimonidine Tartrate-Timolol (COMBIGAN OP) Apply 1 drop to eye 2 (two) times daily. Both eyes    . buPROPion (WELLBUTRIN SR) 150 MG 12 hr tablet Take 300 mg by mouth daily.     Marland Kitchen dextromethorphan 15 MG/5ML syrup Take 10 mLs by mouth 4 (four) times daily as needed for cough.    . escitalopram (LEXAPRO)  10 MG tablet Take 15 mg by mouth at bedtime.   0  . esomeprazole (NEXIUM) 40 MG capsule Take 40 mg by mouth daily.    . fluticasone furoate-vilanterol (BREO ELLIPTA) 100-25 MCG/INH AEPB Inhale 1 puff into the lungs daily. 1 each 5  . ipratropium (ATROVENT) 0.06 % nasal spray Place 2 sprays into both nostrils 4 (four) times daily as needed for rhinitis.    Marland Kitchen lidocaine-prilocaine (EMLA) cream Apply to the areola and cover with plastic wrap one hour prior to leaving for surgery. 5 g 0  .  LUMIGAN 0.01 % SOLN Place 1 drop into both eyes at bedtime.   0  . mirabegron ER (MYRBETRIQ) 25 MG TB24 tablet Take 1 tablet (25 mg total) by mouth daily. 90 tablet 3  . nicotine polacrilex (NICORETTE) 2 MG gum Take 2 mg by mouth as needed for smoking cessation.    . ondansetron (ZOFRAN) 4 MG tablet Take 4 mg by mouth every 6 (six) hours as needed for nausea/vomiting.    . rosuvastatin (CRESTOR) 10 MG tablet Take 10 mg by mouth at bedtime.     . tamoxifen (NOLVADEX) 20 MG tablet Take 1 tablet (20 mg total) by mouth daily. 30 tablet 12  . Tiotropium Bromide Monohydrate (SPIRIVA RESPIMAT) 2.5 MCG/ACT AERS Inhale 2 Inhalers into the lungs daily. (Patient taking differently: Inhale 2 puffs into the lungs daily. ) 1 Inhaler 5   No current facility-administered medications for this visit.     Review of Systems Review of Systems  Blood pressure 124/81, pulse 80, temperature 98.6 F (37 C), temperature source Skin, height 5\' 2"  (1.575 m), weight 104 lb (47.2 kg), SpO2 97 %.  Physical Exam Physical Exam Chest:       Data Reviewed No new laboratory data.  Assessment Doing well status post left mastectomy and sentinel node biopsy.  Plan At the last visit I asked the patient to consider whether she would discuss chemotherapy if she had a high MammaPrint/Oncotype score.  In discussion today she does not feel that she would consider chemotherapy regardless of the above-mentioned testing.  I think it is not  reasonable to spend this kind of money if the information has not been to be used, but only for reassurance if she was low risk.  The patient discontinued her HRT supplement two weeks ago without any vasomotor symptoms or mode changes.    The patient will be asked to make use of a pediatric aspirin tablet daily.   She is doing very well all in all, spirits are good and her shoulder range of motion is improving nicely.  She may swim if desired.  (Mainly water walking).  We will plan for a follow-up examination in 2 weeks.  Elaine Clayton  05/25/2019, 8:52 AM

## 2019-05-25 NOTE — Addendum Note (Signed)
Addended by: Robert Bellow on: 05/25/2019 08:55 AM   Modules accepted: Orders

## 2019-06-07 ENCOUNTER — Ambulatory Visit (INDEPENDENT_AMBULATORY_CARE_PROVIDER_SITE_OTHER): Payer: Medicare Other | Admitting: General Surgery

## 2019-06-07 ENCOUNTER — Other Ambulatory Visit: Payer: Self-pay

## 2019-06-07 ENCOUNTER — Encounter: Payer: Self-pay | Admitting: General Surgery

## 2019-06-07 VITALS — BP 170/83 | HR 88 | Temp 97.0°F | Ht 62.0 in | Wt 106.0 lb

## 2019-06-07 DIAGNOSIS — C50912 Malignant neoplasm of unspecified site of left female breast: Secondary | ICD-10-CM

## 2019-06-07 NOTE — Patient Instructions (Addendum)
Follow up in 1 month.   May keep a cover over the area that is open. This will heal up. You may swim.   Call and speak with Dr Doy Hutching about your blood pressure.

## 2019-06-07 NOTE — Progress Notes (Signed)
Patient ID: Elaine Clayton, female   DOB: Nov 04, 1946, 73 y.o.   MRN: [id number]  Chief Complaint  Patient presents with  . Follow-up    HPI Elaine Clayton is a 73 y.o. female Here for her postoperative visit, left mastectomy on5/18/2020. She is here with her husband, [name]. HPI  Past Medical History:  Diagnosis Date  . Anemia   . Ataxia    Apaxia  . Bipolar affective disorder ([place])    2  . Breast cancer of upper-outer quadrant of left female breast ([place]) 05/09/2019   LEFT; Invasive LOBULAR carcinoma,17 mm, T1c, N0, multifocal, lymphovascular invasion noted.  ER: 90%; PR: Neg, Her 2 neu not overexpressed.   . Broken foot 2015   right foot  . Chronic gastritis   . Chronic kidney disease    Dr Holley Raring  . COPD (chronic obstructive pulmonary disease) ([place])   . Dizziness    inner ear (per pt) several times per week  . Dysphagia   . Falls frequently   . Glaucoma   . Hyperlipemia   . Hypertension    pt has recently come off BP meds.  MD aware. BP seems stable.  . Hyperthyroidism   . Lithium toxicity 2015  . Malignant neoplasm of upper-outer quadrant of female breast ([place]) 05/07/2007   RIGHT: tubular carcinoma, 1 mm, T1a,Nx, Wide excision, whole breast radiation.   . Memory difficulty   . Neck stiffness    s/p C3-C4 fusion, limited right turn  . Osteoarthritis    back  . Pancreatitis   . Parkinson disease ([place])   . Pituitary microadenoma ([place])   . Recurrent UTI   . Renal insufficiency   . Stomach ulcer 2112    Past Surgical History:  Procedure Laterality Date  . APPENDECTOMY    . BREAST BIOPSY Right 2011   neg  . BREAST BIOPSY Left 02/09/2019   affirm stereo 2 areas/group 1 x clip/ group 2 coil clip/path pending  . BREAST EXCISIONAL BIOPSY Right 2001   neg  . 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 BREAST EXCISIONAL BIOPSY Left 10/26/2012   neg  . BREAST SURGERY Left  2013   left breast wide  excision,Intraductal papilloma, ductal hyperplasia and sclerosing adenosis. Microcalcifications associated with columnar cell change. No evidence of atypia or malignancy. Margins are unremarkable.  Marland Kitchen BREAST SURGERY Right November 06, 1999   multiple areas of microcalcification showing evidence of sclerosing adenosis and ductal hyperplasia.  Marland Kitchen BREAST SURGERY Left May 07, 2007   wide excision.  . cataract Right 2013  . CATARACT EXTRACTION W/PHACO Left 03/05/2016   Procedure: CATARACT EXTRACTION PHACO AND INTRAOCULAR LENS PLACEMENT (IOC);  Surgeon: Leandrew Koyanagi, MD;  Location: [place];  Service: Ophthalmology;  Laterality: Left;  . COLON SURGERY    . COLONOSCOPY  2004  . COLONOSCOPY N/A 05/14/2015   Procedure: COLONOSCOPY;  Surgeon: Manya Silvas, MD;  Location: [facility] ENDOSCOPY;  Service: Endoscopy;  Laterality: N/A;  . ERCP N/A   . ESOPHAGOGASTRODUODENOSCOPY N/A 05/14/2015   Procedure: ESOPHAGOGASTRODUODENOSCOPY (EGD);  Surgeon: Manya Silvas, MD;  Location: [facility] ENDOSCOPY;  Service: Endoscopy;  Laterality: N/A;  . EUS N/A 2011, 2012  . EYE SURGERY  2013  . GLAUCOMA SURGERY Right 2013  . LAPAROSCOPIC RIGHT COLECTOMY Right 06/22/2015   LAPAROSCOPIC RIGHT COLECTOMY;  Surgeon: Robert Bellow, MD;  Location: [facility] ORS; 1.6 cm tubular adenoma.  Marland Kitchen NECK SURGERY  2006  C3-4 fusion  . right breast cancer    . SAVORY DILATION N/A 05/14/2015   Procedure: SAVORY DILATION;  Surgeon: Manya Silvas, MD;  Location: [facility] ENDOSCOPY;  Service: Endoscopy;  Laterality: N/A;  . SIMPLE MASTECTOMY WITH AXILLARY SENTINEL NODE BIOPSY Left 05/09/2019   Procedure: LEFT SIMPLE MASTECTOMY WITH SENTINEL LYMPH NODE BIOPSY LEFT - Verified injection at 11:00;  Surgeon: Robert Bellow, MD;  Location: [facility] ORS;  Service: General;  Laterality: Left;  . TRABECULECTOMY Left 03/05/2016   Procedure: TRABECULECTOMY WITH [facility] AND EXPRESS SHUNT;  Surgeon: Leandrew Koyanagi, MD;  Location: [place];  Service: Ophthalmology;  Laterality: Left;  . TUBAL LIGATION      Family History  Problem Relation Age of Onset  . Breast cancer Mother 88  . Kidney cancer Neg Hx   . Bladder Cancer Neg Hx     Social History Social History   Tobacco Use  . Smoking status: Former Smoker    Packs/day: 1.00    Years: 35.00    Pack years: 35.00    Types: Cigarettes    Quit date: 12/23/2007    Years since quitting: 11.4  . Smokeless tobacco: Never Used  . Tobacco comment: Uses patch  Substance Use Topics  . Alcohol use: Yes    Alcohol/week: 0.0 standard drinks    Comment: 1 glass wine/mo  . Drug use: No    Allergies  Allergen Reactions  . Influenza Vaccines Anaphylaxis  . Flu Virus Vaccine Other (See Comments)    Muscle spams  . Gabapentin Anxiety  . Sulfa Antibiotics Rash  . Tetracyclines & Related Rash    Current Outpatient Medications  Medication Sig Dispense Refill  . albuterol (PROVENTIL) (2.5 MG/3ML) 0.083% nebulizer solution Take 3 mLs (2.5 mg total) by nebulization every 6 (six) hours as needed for wheezing or shortness of breath. 75 mL 12  . ALPRAZolam (XANAX) 0.5 MG tablet Take 0.5-1 mg by mouth See admin instructions. Take 1 tablet in the AM and 1/2 tablet at bedtime.    . AZOPT 1 % ophthalmic suspension Place 1 drop into the left eye daily.   0  . benzonatate (TESSALON) 100 MG capsule Take 1 capsule (100 mg total) by mouth 3 (three) times daily. (Patient taking differently: Take 100 mg by mouth 3 (three) times daily as needed for cough. ) 30 capsule 1  . bisacodyl (DULCOLAX) 5 MG EC tablet Take 5 mg by mouth daily as needed for moderate constipation.    . Brimonidine Tartrate-Timolol (COMBIGAN OP) Apply 1 drop to eye 2 (two) times daily. Both eyes    . buPROPion (WELLBUTRIN SR) 150 MG 12 hr tablet Take 300 mg by mouth daily.     Marland Kitchen dextromethorphan 15 MG/5ML syrup Take 10 mLs by mouth 4 (four) times daily as needed for cough.    . escitalopram (LEXAPRO) 10 MG tablet Take  15 mg by mouth at bedtime.   0  . esomeprazole (NEXIUM) 40 MG capsule Take 40 mg by mouth daily.    . fluticasone furoate-vilanterol (BREO ELLIPTA) 100-25 MCG/INH AEPB Inhale 1 puff into the lungs daily. 1 each 5  . ipratropium (ATROVENT) 0.06 % nasal spray Place 2 sprays into both nostrils 4 (four) times daily as needed for rhinitis.    Marland Kitchen LUMIGAN 0.01 % SOLN Place 1 drop into both eyes at bedtime.   0  . mirabegron ER (MYRBETRIQ) 25 MG TB24 tablet Take 1 tablet (25 mg total) by mouth daily. 90 tablet  3  . nicotine polacrilex (NICORETTE) 2 MG gum Take 2 mg by mouth as needed for smoking cessation.    . ondansetron (ZOFRAN) 4 MG tablet Take 4 mg by mouth every 6 (six) hours as needed for nausea/vomiting.    . rosuvastatin (CRESTOR) 10 MG tablet Take 10 mg by mouth at bedtime.     . tamoxifen (NOLVADEX) 20 MG tablet Take 1 tablet (20 mg total) by mouth daily. 30 tablet 12  . Tiotropium Bromide Monohydrate (SPIRIVA RESPIMAT) 2.5 MCG/ACT AERS Inhale 2 Inhalers into the lungs daily. (Patient taking differently: Inhale 2 puffs into the lungs daily. ) 1 Inhaler 5   No current facility-administered medications for this visit.     Review of Systems Review of Systems  Constitutional: Negative.   Respiratory: Negative.   Cardiovascular: Negative.     Blood pressure (!) 170/83, pulse 88, temperature (!) 97 F (36.1 C), temperature source Skin, height 5\' 2"  (1.575 m), weight 106 lb (48.1 kg), SpO2 97 %.  Physical Exam Physical Exam Exam conducted with a chaperone present.  Constitutional:      Appearance: Normal appearance.  Chest:       Comments: Well healing left mastectomy site.  Skin:    General: Skin is warm and dry.  Neurological:     Mental Status: She is alert and oriented to person, place, and time.  Psychiatric:        Mood and Affect: Mood normal.        Behavior: Behavior normal.        Assessment Doing well post left mastectomy.  Plan  Follow up in one month.   Continue Neosporin application to the area of thermal injury on the upper flap.   HPI, Physical Exam, Assessment and Plan have been scribed under the direction and in the presence of Hervey Ard, MD.  Gaspar Cola, CMA  I have completed the exam and reviewed the above documentation for accuracy and completeness.  I agree with the above.  Haematologist has been used and any errors in dictation or transcription are unintentional.  Hervey Ard, M.D., F.A.C.S.   Forest Gleason Derwin Reddy 06/08/2019, 2:35 PM

## 2019-06-13 ENCOUNTER — Other Ambulatory Visit: Payer: Self-pay

## 2019-06-13 ENCOUNTER — Encounter: Payer: Self-pay | Admitting: Nurse Practitioner

## 2019-06-13 ENCOUNTER — Ambulatory Visit: Payer: Medicare Other | Admitting: Nurse Practitioner

## 2019-06-13 ENCOUNTER — Encounter: Payer: Self-pay | Admitting: *Deleted

## 2019-06-13 ENCOUNTER — Ambulatory Visit: Payer: Medicare Other | Admitting: Pulmonary Disease

## 2019-06-13 DIAGNOSIS — J449 Chronic obstructive pulmonary disease, unspecified: Secondary | ICD-10-CM

## 2019-06-13 NOTE — Patient Instructions (Signed)
--  CONTINUE Spiriva Respimat 2.5 mcg 2 inhalations once a day. Refilled.  --CONTINUE Breo 100-25 mcg 1 puff daily.  Refilled. --CONTINUE Albuterol inhaler 2 puffs every 4 hours as needed for shortness of breath or wheezing. Refilled  Follow up: Follow up with Dr. Loanne Drilling in 3 months or sooner if needed

## 2019-06-13 NOTE — Progress Notes (Signed)
Thinking of you card mailed to patient. 

## 2019-06-13 NOTE — Progress Notes (Signed)
@Patient  ID: Elaine Clayton, female    DOB: Sep 18, 1946, 73 y.o.   MRN: [id number]  No chief complaint on file.   Referring provider: Idelle Crouch, MD  HPI  73 year old female former smoker with COPD who is followed by Dr. Loanne Drilling. Former smoker. 35 pack-years.  Quit in 2009. Environmental exposures: None Retired Marine scientist.  Husband previously worked as a Advice worker. Maintenance: Spiriva, Breo, Albuterol  Tests: Chest imaging: CXR 10/25/15 - Hyperinflated lung fields. No airspace disease.  PFT: 10/16/18 - Ratio 58 FEV1 1.37L (57%) FVC 2.31L (73%)  Moderately severe obstructive defect with air trapping and severely decreased DLCO consistent with emphysema   OV 06/13/19 - Follow up Patient presents today for follow-up visit.  She was last seen by Dr. Ebony Hail on 02/14/2019.  Patient states that this is been a stable interval for her.  She is compliant with Spiriva, Breo, and albuterol as needed.  She states that she has not needed to use her albuterol in months.  Patient is seen by oncology for breast cancer.  She recently had lumpectomy. Denies f/c/s, n/v/d, hemoptysis, PND, leg swelling.    Allergies  Allergen Reactions  . Influenza Vaccines Anaphylaxis  . Flu Virus Vaccine Other (See Comments)    Muscle spams  . Gabapentin Anxiety  . Sulfa Antibiotics Rash  . Tetracyclines & Related Rash    Immunization History  Administered Date(s) Administered  . BCG 07/01/2018  . PPD Test 06/28/2018  . Tdap 10/04/2014    Past Medical History:  Diagnosis Date  . Anemia   . Ataxia    Apaxia  . Bipolar affective disorder ([place])    2  . Breast cancer of upper-outer quadrant of left female breast ([place]) 05/09/2019   LEFT; Invasive LOBULAR carcinoma,17 mm, T1c, N0, multifocal, lymphovascular invasion noted.  ER: 90%; PR: Neg, Her 2 neu not overexpressed.   . Broken foot 2015   right foot  . Chronic gastritis   . Chronic kidney disease    Dr Holley Raring  . COPD (chronic  obstructive pulmonary disease) ([place])   . Dizziness    inner ear (per pt) several times per week  . Dysphagia   . Falls frequently   . Glaucoma   . Hyperlipemia   . Hypertension    pt has recently come off BP meds.  MD aware. BP seems stable.  . Hyperthyroidism   . Lithium toxicity 2015  . Malignant neoplasm of upper-outer quadrant of female breast ([place]) 05/07/2007   RIGHT: tubular carcinoma, 1 mm, T1a,Nx, Wide excision, whole breast radiation.   . Memory difficulty   . Neck stiffness    s/p C3-C4 fusion, limited right turn  . Osteoarthritis    back  . Pancreatitis   . Parkinson disease ([place])   . Pituitary microadenoma ([place])   . Recurrent UTI   . Renal insufficiency   . Stomach ulcer 2112    Tobacco History: Social History   Tobacco Use  Smoking Status Former Smoker  . Packs/day: 1.00  . Years: 35.00  . Pack years: 35.00  . Types: Cigarettes  . Quit date: 12/23/2007  . Years since quitting: 11.4  Smokeless Tobacco Never Used  Tobacco Comment   Uses patch   Counseling given: Yes Comment: Uses patch   Outpatient Encounter Medications as of 06/13/2019  Medication Sig  . albuterol (PROVENTIL) (2.5 MG/3ML) 0.083% nebulizer solution Take 3 mLs (2.5 mg total) by nebulization every 6 (six) hours as needed for wheezing or shortness  of breath.  . ALPRAZolam (XANAX) 0.5 MG tablet Take 0.5-1 mg by mouth See admin instructions. Take 1 tablet in the AM and 1/2 tablet at bedtime.  . AZOPT 1 % ophthalmic suspension Place 1 drop into the left eye daily.   . benzonatate (TESSALON) 100 MG capsule Take 1 capsule (100 mg total) by mouth 3 (three) times daily. (Patient taking differently: Take 100 mg by mouth 3 (three) times daily as needed for cough. )  . bisacodyl (DULCOLAX) 5 MG EC tablet Take 5 mg by mouth daily as needed for moderate constipation.  . Brimonidine Tartrate-Timolol (COMBIGAN OP) Apply 1 drop to eye 2 (two) times daily. Both eyes  . buPROPion (WELLBUTRIN SR) 150 MG 12 hr  tablet Take 300 mg by mouth daily.   Marland Kitchen dextromethorphan 15 MG/5ML syrup Take 10 mLs by mouth 4 (four) times daily as needed for cough.  . escitalopram (LEXAPRO) 10 MG tablet Take 15 mg by mouth at bedtime.   Marland Kitchen esomeprazole (NEXIUM) 40 MG capsule Take 40 mg by mouth daily.  . fluticasone furoate-vilanterol (BREO ELLIPTA) 100-25 MCG/INH AEPB Inhale 1 puff into the lungs daily.  Marland Kitchen ipratropium (ATROVENT) 0.06 % nasal spray Place 2 sprays into both nostrils 4 (four) times daily as needed for rhinitis.  Marland Kitchen LUMIGAN 0.01 % SOLN Place 1 drop into both eyes at bedtime.   . mirabegron ER (MYRBETRIQ) 25 MG TB24 tablet Take 1 tablet (25 mg total) by mouth daily.  . nicotine polacrilex (NICORETTE) 2 MG gum Take 2 mg by mouth as needed for smoking cessation.  . ondansetron (ZOFRAN) 4 MG tablet Take 4 mg by mouth every 6 (six) hours as needed for nausea/vomiting.  . rosuvastatin (CRESTOR) 10 MG tablet Take 10 mg by mouth at bedtime.   . tamoxifen (NOLVADEX) 20 MG tablet Take 1 tablet (20 mg total) by mouth daily.  . Tiotropium Bromide Monohydrate (SPIRIVA RESPIMAT) 2.5 MCG/ACT AERS Inhale 2 Inhalers into the lungs daily. (Patient taking differently: Inhale 2 puffs into the lungs daily. )   No facility-administered encounter medications on file as of 06/13/2019.      Review of Systems  Review of Systems  Constitutional: Negative.  Negative for fever.  HENT: Negative.   Respiratory: Negative for cough, shortness of breath and wheezing.   Cardiovascular: Negative.  Negative for chest pain, palpitations and leg swelling.  Gastrointestinal: Negative.   Allergic/Immunologic: Negative.   Neurological: Negative.   Psychiatric/Behavioral: Negative.        Physical Exam  BP 132/80 (BP Location: Right Arm, Patient Position: Sitting, Cuff Size: Normal)   Pulse 69   Temp 97.6 F (36.4 C)   Ht 5\' 4"  (1.626 m)   Wt 105 lb 9.6 oz (47.9 kg)   SpO2 100%   BMI 18.13 kg/m   Wt Readings from Last 5 Encounters:   06/13/19 105 lb 9.6 oz (47.9 kg)  06/07/19 106 lb (48.1 kg)  05/24/19 104 lb (47.2 kg)  05/17/19 104 lb 3.2 oz (47.3 kg)  05/09/19 105 lb (47.6 kg)     Physical Exam Vitals signs and nursing note reviewed.  Constitutional:      General: She is not in acute distress.    Appearance: She is well-developed.  Cardiovascular:     Rate and Rhythm: Normal rate and regular rhythm.  Pulmonary:     Effort: Pulmonary effort is normal. No respiratory distress.     Breath sounds: Normal breath sounds. No wheezing or rhonchi.  Musculoskeletal:  General: No swelling.  Neurological:     Mental Status: She is alert and oriented to person, place, and time.       Assessment & Plan:   COPD (chronic obstructive pulmonary disease) ([place]) Patient has had a stable interval.  We will continue current therapy.  Plan: Patient Instructions  --CONTINUE Spiriva Respimat 2.5 mcg 2 inhalations once a day. Refilled.  --CONTINUE Breo 100-25 mcg 1 puff daily.  Refilled. --CONTINUE Albuterol inhaler 2 puffs every 4 hours as needed for shortness of breath or wheezing. Refilled  Follow up: Follow up with Dr. Loanne Drilling in 3 months or sooner if needed       Fenton Foy, NP 06/13/2019

## 2019-06-13 NOTE — Assessment & Plan Note (Signed)
Patient has had a stable interval.  We will continue current therapy.  Plan: Patient Instructions  --CONTINUE Spiriva Respimat 2.5 mcg 2 inhalations once a day. Refilled.  --CONTINUE Breo 100-25 mcg 1 puff daily.  Refilled. --CONTINUE Albuterol inhaler 2 puffs every 4 hours as needed for shortness of breath or wheezing. Refilled  Follow up: Follow up with Dr. Loanne Drilling in 3 months or sooner if needed

## 2019-06-29 NOTE — Progress Notes (Signed)
An appointment recall already in the system for this patient to be set up to see Dr. Loanne Drilling.

## 2019-07-05 ENCOUNTER — Encounter: Payer: Self-pay | Admitting: *Deleted

## 2019-07-05 NOTE — Progress Notes (Signed)
Talked to patient today.  She is interested in mastectomy supplies.  She was encouraged to try [facility] in [place].

## 2019-07-07 ENCOUNTER — Ambulatory Visit: Payer: Medicare Other | Admitting: General Surgery

## 2019-07-07 ENCOUNTER — Other Ambulatory Visit: Payer: Self-pay

## 2019-07-07 ENCOUNTER — Encounter: Payer: Self-pay | Admitting: General Surgery

## 2019-07-07 VITALS — BP 124/63 | HR 74 | Temp 98.0°F | Wt 104.0 lb

## 2019-07-07 DIAGNOSIS — C50912 Malignant neoplasm of unspecified site of left female breast: Secondary | ICD-10-CM

## 2019-07-07 NOTE — Progress Notes (Unsigned)
Patient ID: Elaine Clayton, female   DOB: [birth year], 73 y.o.   MRN: [id number]  Chief Complaint  Patient presents with  . Routine Post Op    HPI Elaine Clayton is a 73 y.o. female here for a post op left mastectomy done on 05/09/19. She is here with her husband, Elaine Clayton.  She reports some drainage. She has been using neosporin to the area.  HPI  Past Medical History:  Diagnosis Date  . Anemia   . Ataxia    Apaxia  . Bipolar affective disorder ([place])    2  . Breast cancer of upper-outer quadrant of left female breast ([place]) 05/09/2019   LEFT; Invasive LOBULAR carcinoma,17 mm, T1c, N0, multifocal, lymphovascular invasion noted.  ER: 90%; PR: Neg, Her 2 neu not overexpressed.   . Broken foot 2015   right foot  . Chronic gastritis   . Chronic kidney disease    Dr Holley Raring  . COPD (chronic obstructive pulmonary disease) ([place])   . Dizziness    inner ear (per pt) several times per week  . Dysphagia   . Falls frequently   . Glaucoma   . Hyperlipemia   . Hypertension    pt has recently come off BP meds.  MD aware. BP seems stable.  . Hyperthyroidism   . Lithium toxicity 2015  . Malignant neoplasm of upper-outer quadrant of female breast ([place]) 05/07/2007   RIGHT: tubular carcinoma, 1 mm, T1a,Nx, Wide excision, whole breast radiation.   . Memory difficulty   . Neck stiffness    s/p C3-C4 fusion, limited right turn  . Osteoarthritis    back  . Pancreatitis   . Parkinson disease ([place])   . Pituitary microadenoma ([place])   . Recurrent UTI   . Renal insufficiency   . Stomach ulcer 2112    Past Surgical History:  Procedure Laterality Date  . APPENDECTOMY    . BREAST BIOPSY Right 2011   neg  . BREAST BIOPSY Left 02/09/2019   affirm stereo 2 areas/group 1 x clip/ group 2 coil clip/path pending  . BREAST EXCISIONAL BIOPSY Right 2001   neg  . 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 BREAST EXCISIONAL BIOPSY Left 10/26/2012   neg  .  BREAST SURGERY Left  2013   left breast wide excision,Intraductal papilloma, ductal hyperplasia and sclerosing adenosis. Microcalcifications associated with columnar cell change. No evidence of atypia or malignancy. Margins are unremarkable.  Marland Kitchen BREAST SURGERY Right November 06, 1999   multiple areas of microcalcification showing evidence of sclerosing adenosis and ductal hyperplasia.  Marland Kitchen BREAST SURGERY Left May 07, 2007   wide excision.  . cataract Right 2013  . CATARACT EXTRACTION W/PHACO Left 03/05/2016   Procedure: CATARACT EXTRACTION PHACO AND INTRAOCULAR LENS PLACEMENT (IOC);  Surgeon: Leandrew Koyanagi, MD;  Location: [facility];  Service: Ophthalmology;  Laterality: Left;  . COLON SURGERY    . COLONOSCOPY  2004  . COLONOSCOPY N/A 05/14/2015   Procedure: COLONOSCOPY;  Surgeon: Manya Silvas, MD;  Location: [facility] ENDOSCOPY;  Service: Endoscopy;  Laterality: N/A;  . ERCP N/A   . ESOPHAGOGASTRODUODENOSCOPY N/A 05/14/2015   Procedure: ESOPHAGOGASTRODUODENOSCOPY (EGD);  Surgeon: Manya Silvas, MD;  Location: [facility] ENDOSCOPY;  Service: Endoscopy;  Laterality: N/A;  . EUS N/A 2011, 2012  . EYE SURGERY  2013  . GLAUCOMA SURGERY Right 2013  . LAPAROSCOPIC RIGHT COLECTOMY Right 06/22/2015   LAPAROSCOPIC RIGHT COLECTOMY;  Surgeon: Forest Gleason  Byrnett, MD;  Location: [facility] ORS; 1.6 cm tubular adenoma.  Marland Kitchen NECK SURGERY  2006   C3-4 fusion  . right breast cancer    . SAVORY DILATION N/A 05/14/2015   Procedure: SAVORY DILATION;  Surgeon: Manya Silvas, MD;  Location: [facility] ENDOSCOPY;  Service: Endoscopy;  Laterality: N/A;  . SIMPLE MASTECTOMY WITH AXILLARY SENTINEL NODE BIOPSY Left 05/09/2019   Procedure: LEFT SIMPLE MASTECTOMY WITH SENTINEL LYMPH NODE BIOPSY LEFT - Verified injection at 11:00;  Surgeon: Robert Bellow, MD;  Location: [facility] ORS;  Service: General;  Laterality: Left;  . TRABECULECTOMY Left 03/05/2016   Procedure: TRABECULECTOMY WITH [facility] AND EXPRESS SHUNT;  Surgeon: Leandrew Koyanagi, MD;  Location: [facility];  Service: Ophthalmology;  Laterality: Left;  . TUBAL LIGATION      Family History  Problem Relation Age of Onset  . Breast cancer Mother 87  . Kidney cancer Neg Hx   . Bladder Cancer Neg Hx     Social History Social History   Tobacco Use  . Smoking status: Former Smoker    Packs/day: 1.00    Years: 35.00    Pack years: 35.00    Types: Cigarettes    Quit date: 12/23/2007    Years since quitting: 11.5  . Smokeless tobacco: Never Used  . Tobacco comment: Uses patch  Substance Use Topics  . Alcohol use: Yes    Alcohol/week: 0.0 standard drinks    Comment: 1 glass wine/mo  . Drug use: No    Allergies  Allergen Reactions  . Influenza Vaccines Anaphylaxis  . Flu Virus Vaccine Other (See Comments)    Muscle spams  . Gabapentin Anxiety  . Sulfa Antibiotics Rash  . Tetracyclines & Related Rash    Current Outpatient Medications  Medication Sig Dispense Refill  . albuterol (PROVENTIL) (2.5 MG/3ML) 0.083% nebulizer solution Take 3 mLs (2.5 mg total) by nebulization every 6 (six) hours as needed for wheezing or shortness of breath. 75 mL 12  . ALPRAZolam (XANAX) 0.5 MG tablet Take 0.5-1 mg by mouth See admin instructions. Take 1 tablet in the AM and 1/2 tablet at bedtime.    . AZOPT 1 % ophthalmic suspension Place 1 drop into the left eye daily.   0  . benzonatate (TESSALON) 100 MG capsule Take 1 capsule (100 mg total) by mouth 3 (three) times daily. (Patient taking differently: Take 100 mg by mouth 3 (three) times daily as needed for cough. ) 30 capsule 1  . bisacodyl (DULCOLAX) 5 MG EC tablet Take 5 mg by mouth daily as needed for moderate constipation.    . Brimonidine Tartrate-Timolol (COMBIGAN OP) Apply 1 drop to eye 2 (two) times daily. Both eyes    . buPROPion (WELLBUTRIN SR) 150 MG 12 hr tablet Take 300 mg by mouth daily.     Marland Kitchen dextromethorphan 15 MG/5ML syrup Take 10 mLs by mouth 4 (four) times daily as needed for cough.    .  escitalopram (LEXAPRO) 10 MG tablet Take 15 mg by mouth at bedtime.   0  . esomeprazole (NEXIUM) 40 MG capsule Take 40 mg by mouth daily.    . fluticasone furoate-vilanterol (BREO ELLIPTA) 100-25 MCG/INH AEPB Inhale 1 puff into the lungs daily. 1 each 5  . ipratropium (ATROVENT) 0.06 % nasal spray Place 2 sprays into both nostrils 4 (four) times daily as needed for rhinitis.    Marland Kitchen LUMIGAN 0.01 % SOLN Place 1 drop into both eyes at bedtime.   0  .  metoprolol succinate (TOPROL-XL) 50 MG 24 hr tablet Take 12.5 mg by mouth daily.    . mirabegron ER (MYRBETRIQ) 25 MG TB24 tablet Take 1 tablet (25 mg total) by mouth daily. 90 tablet 3  . nicotine polacrilex (NICORETTE) 2 MG gum Take 2 mg by mouth as needed for smoking cessation.    . ondansetron (ZOFRAN) 4 MG tablet Take 4 mg by mouth every 6 (six) hours as needed for nausea/vomiting.    . rosuvastatin (CRESTOR) 10 MG tablet Take 10 mg by mouth at bedtime.     . tamoxifen (NOLVADEX) 20 MG tablet Take 1 tablet (20 mg total) by mouth daily. 30 tablet 12  . Tiotropium Bromide Monohydrate (SPIRIVA RESPIMAT) 2.5 MCG/ACT AERS Inhale 2 Inhalers into the lungs daily. (Patient taking differently: Inhale 2 puffs into the lungs daily. ) 1 Inhaler 5   No current facility-administered medications for this visit.     Review of Systems Review of Systems  Constitutional: Negative.   Respiratory: Negative.   Cardiovascular: Negative.     Blood pressure 124/63, pulse 74, temperature 98 F (36.7 C), weight 104 lb (47.2 kg), SpO2 98 %.  Physical Exam Physical Exam Constitutional:      Appearance: Normal appearance.  Neurological:     Mental Status: She is alert.  Psychiatric:        Mood and Affect: Mood normal.        Behavior: Behavior normal.     Data Reviewed ***  Assessment ***  Plan  Follow up in   HPI, Physical Exam, Assessment and Plan have been scribed under the direction and in the presence of Robert Bellow, MD  Concepcion Living, LPN  Concepcion Living M 07/07/2019, 1:45 PM

## 2019-07-07 NOTE — Patient Instructions (Addendum)
We put Silver nitrate on your wound today. You may get some gray/black on your dressing this is normal.  May continue to keep a dressing over the area.  Three week follow up

## 2019-07-28 ENCOUNTER — Ambulatory Visit: Payer: Medicare Other | Admitting: General Surgery

## 2019-09-12 ENCOUNTER — Encounter: Payer: Self-pay | Admitting: Pulmonary Disease

## 2019-09-12 ENCOUNTER — Telehealth: Payer: Self-pay | Admitting: Pulmonary Disease

## 2019-09-12 ENCOUNTER — Other Ambulatory Visit: Payer: Self-pay

## 2019-09-12 ENCOUNTER — Ambulatory Visit: Payer: Medicare Other | Admitting: Pulmonary Disease

## 2019-09-12 VITALS — BP 120/60 | HR 78 | Temp 98.0°F | Ht 64.0 in | Wt 112.8 lb

## 2019-09-12 DIAGNOSIS — J449 Chronic obstructive pulmonary disease, unspecified: Secondary | ICD-10-CM

## 2019-09-12 MED ORDER — BREO ELLIPTA 100-25 MCG/INH IN AEPB: 1.0000 | INHALATION_SPRAY | Freq: Every day | RESPIRATORY_TRACT | 5 refills | Status: AC

## 2019-09-12 MED ORDER — SPIRIVA RESPIMAT 2.5 MCG/ACT IN AERS: 2.0000 | INHALATION_SPRAY | Freq: Every day | RESPIRATORY_TRACT | 0 refills | Status: AC

## 2019-09-12 MED ORDER — ALBUTEROL SULFATE (2.5 MG/3ML) 0.083% IN NEBU: 2.5000 mg | INHALATION_SOLUTION | Freq: Four times a day (QID) | RESPIRATORY_TRACT | 6 refills | Status: AC | PRN

## 2019-09-12 MED ORDER — SPIRIVA RESPIMAT 2.5 MCG/ACT IN AERS: 2.0000 | INHALATION_SPRAY | Freq: Every day | RESPIRATORY_TRACT | 6 refills | Status: DC

## 2019-09-12 NOTE — Telephone Encounter (Signed)
Just following up when patient will be scheduled for lung cancer screening

## 2019-09-12 NOTE — Progress Notes (Signed)
Synopsis: Referred in 10/2018 for COPD  Subjective:   PATIENT ID: Elaine Clayton GENDER: female DOB: 1946/08/06, MRN: [id number]   HPI  Chief Complaint  Patient presents with  . Follow-up    copd   Elaine Clayton is a 73 year old female with hx recurrent invasive lobular carcinoma of the left breast s/p mastectomy who presents for follow-up of COPD.  Since our last visit, she has seen by [facility] NP in June with stable lung issues at the time. She is compliant with her Breo and Spiriva. She has used her Albuterol nebulizer twice in the last few months. She denies ED/urgent care visits for COPD exacerbations in the last year. Pre-pandemic, she was participating in pool exercises. She is still walking/swimming a few times a week when they are able to schedule a reservation with the pool. Living in a retirement center with her husband. Has shortness of breath with high levels of exertion. Overall feels her COPD is well-controlled.  Former smoker. 35 pack-years. Quit in 2009.  Environmental exposures: None Retired Marine scientist.  Husband previously worked as a Advice worker.  I have personally reviewed patient's past medical/family/social history/allergies/current medications.  Allergies  Allergen Reactions  . Influenza Vaccines Anaphylaxis  . Flu Virus Vaccine Other (See Comments)    Muscle spams  . Gabapentin Anxiety  . Sulfa Antibiotics Rash  . Tetracyclines & Related Rash     Outpatient Medications Prior to Visit  Medication Sig Dispense Refill  . ALPRAZolam (XANAX) 0.5 MG tablet Take 0.5-1 mg by mouth See admin instructions. Take 1 tablet in the AM and 1/2 tablet at bedtime.    . AZOPT 1 % ophthalmic suspension Place 1 drop into the left eye daily.   0  . benzonatate (TESSALON) 100 MG capsule Take 1 capsule (100 mg total) by mouth 3 (three) times daily. (Patient taking differently: Take 100 mg by mouth 3 (three) times daily as needed for cough. ) 30 capsule 1  . bisacodyl  (DULCOLAX) 5 MG EC tablet Take 5 mg by mouth daily as needed for moderate constipation.    . Brimonidine Tartrate-Timolol (COMBIGAN OP) Apply 1 drop to eye 2 (two) times daily. Both eyes    . buPROPion (WELLBUTRIN SR) 150 MG 12 hr tablet Take 300 mg by mouth daily.     Marland Kitchen dextromethorphan 15 MG/5ML syrup Take 10 mLs by mouth 4 (four) times daily as needed for cough.    . escitalopram (LEXAPRO) 10 MG tablet Take 15 mg by mouth at bedtime.   0  . esomeprazole (NEXIUM) 40 MG capsule Take 40 mg by mouth daily.    Marland Kitchen ipratropium (ATROVENT) 0.06 % nasal spray Place 2 sprays into both nostrils 4 (four) times daily as needed for rhinitis.    Marland Kitchen LUMIGAN 0.01 % SOLN Place 1 drop into both eyes at bedtime.   0  . metoprolol succinate (TOPROL-XL) 50 MG 24 hr tablet Take 12.5 mg by mouth daily.    . mirabegron ER (MYRBETRIQ) 25 MG TB24 tablet Take 1 tablet (25 mg total) by mouth daily. 90 tablet 3  . nicotine polacrilex (NICORETTE) 2 MG gum Take 2 mg by mouth as needed for smoking cessation.    . ondansetron (ZOFRAN) 4 MG tablet Take 4 mg by mouth every 6 (six) hours as needed for nausea/vomiting.    . rosuvastatin (CRESTOR) 10 MG tablet Take 10 mg by mouth at bedtime.     . tamoxifen (NOLVADEX) 20 MG tablet Take 1  tablet (20 mg total) by mouth daily. 30 tablet 12  . albuterol (PROVENTIL) (2.5 MG/3ML) 0.083% nebulizer solution Take 3 mLs (2.5 mg total) by nebulization every 6 (six) hours as needed for wheezing or shortness of breath. 75 mL 12  . fluticasone furoate-vilanterol (BREO ELLIPTA) 100-25 MCG/INH AEPB Inhale 1 puff into the lungs daily. 1 each 5  . Tiotropium Bromide Monohydrate (SPIRIVA RESPIMAT) 2.5 MCG/ACT AERS Inhale 2 Inhalers into the lungs daily. (Patient taking differently: Inhale 2 puffs into the lungs daily. ) 1 Inhaler 5   No facility-administered medications prior to visit.     Review of Systems  Constitutional: Negative for chills, diaphoresis, fever, malaise/fatigue and weight loss.   HENT: Negative for congestion, ear pain and sore throat.   Respiratory: Negative for cough, hemoptysis, sputum production, shortness of breath and wheezing.   Cardiovascular: Negative for chest pain, palpitations and leg swelling.  Gastrointestinal: Negative for abdominal pain, heartburn and nausea.  Genitourinary: Negative for frequency.  Musculoskeletal: Negative for joint pain and myalgias.  Skin: Negative for itching and rash.  Neurological: Negative for dizziness, weakness and headaches. Tingling:   Endo/Heme/Allergies: Does not bruise/bleed easily.  Psychiatric/Behavioral: Negative for depression. The patient is not nervous/anxious.      Objective:    Vitals:   09/12/19 1108 09/12/19 1109  BP:  120/60  Pulse:  78  Temp: 98 F (36.7 C)   TempSrc: Temporal   SpO2:  99%  Weight: 112 lb 12.8 oz (51.2 kg)   Height: 5\' 4"  (1.626 m)    SpO2: 99 % O2 Device: None (Room air)   Physical Exam: General: Well-appearing, no acute distress HENT: [place], AT, OP clear, MMM Eyes: EOMI, no scleral icterus Respiratory: Decreased breath sounds bilaterally. No crackles, wheezing or rales Cardiovascular: RRR, -M/R/G, no JVD GI: BS+, soft, nontender Extremities:-Edema,-tenderness Neuro: AAO x4, CNII-XII grossly intact Skin: Intact, no rashes or bruising Psych: Normal mood, normal affect  Chest imaging: CXR 10/25/15 - Hyperinflated lung fields. No airspace disease.  PFT: 10/16/18 - Ratio 58 FEV1 1.37L (57%) FVC 2.31L (73%)  Moderately severe obstructive defect with air trapping and severely decreased DLCO consistent with emphysema  Imaging, labs and test noted above have been reviewed independently by me.  Assessment & Plan:   73 year old female with moderately severe COPD GOLD Class B  COPD, moderately severe (FEV1 57%) --CONTINUE Spiriva Respimat 2.5 mcg two inhalations daily. Refilled --CONTINUE Breo 100-25 mcg one puff daily.  Refilled --CONTINUE Albuterol inhaler 2 puffs every  4 hours as needed for shortness of breath or wheezing.  --Re-refer to Lung Cancer clinic. Discussed risks and benefits of evaluation. She wishes to pursue imaging preferably near home if this can be arranged.  Meds ordered this encounter  Medications  . fluticasone furoate-vilanterol (BREO ELLIPTA) 100-25 MCG/INH AEPB    Sig: Inhale 1 puff into the lungs daily.    Dispense:  1 each    Refill:  5  . Tiotropium Bromide Monohydrate (SPIRIVA RESPIMAT) 2.5 MCG/ACT AERS    Sig: Inhale 2 Inhalers into the lungs daily.    Dispense:  4 g    Refill:  6  . Tiotropium Bromide Monohydrate (SPIRIVA RESPIMAT) 2.5 MCG/ACT AERS    Sig: Inhale 2 puffs into the lungs daily.    Dispense:  4 g    Refill:  0    Order Specific Question:   Lot Number?    Answer:   [id number]    Order Specific Question:   Expiration  Date?    Answer:   10/22/2021    Order Specific Question:   Quantity    Answer:   1  . albuterol (PROVENTIL) (2.5 MG/3ML) 0.083% nebulizer solution    Sig: Take 3 mLs (2.5 mg total) by nebulization every 6 (six) hours as needed for wheezing or shortness of breath.    Dispense:  360 mL    Refill:  6    DX J44.9    Return in about 3 months (around 12/12/2019).  Greater than 50% of this patient 25-minute office visit was spent face-to-face in counseling with the patient/family. We discussed medical diagnosis and treatment plan as noted.  Jaicee Michelotti Rodman Pickle, MD [facility] 09/13/2019 4:43 PM  Personal pager: [phone] If unanswered, please page CCM On-call: [phone]

## 2019-09-12 NOTE — Patient Instructions (Signed)
COPD, moderately severe (FEV1 57%) --CONTINUE Spiriva Respimat 2.5 mcg two inhalations daily --CONTINUE Breo 100-25 mcg one puff daily.   --CONTINUE Albuterol inhaler 2 puffs every 4 hours as needed for shortness of breath or wheezing.  --Re-refer to Lung Cancer Screening clinic

## 2019-09-13 NOTE — Telephone Encounter (Signed)
Refer to telephone note 02/21/19 that was sent to Dr Loanne Drilling. Attempts to schedule pt for lung cancer screening were documented in the referral notes. Pt was dx with Breast Ca after referral was made so she will not qualify until she's been in remission for 5 years.  Let me know if I can help with anything further .

## 2019-09-14 NOTE — Telephone Encounter (Signed)
Thank you Langley Gauss, I will forward this to Dr. Loanne Drilling so she can see again patient doesn't qualify at this time. Been very helpful thank you

## 2019-09-15 ENCOUNTER — Telehealth: Payer: Self-pay | Admitting: *Deleted

## 2019-09-15 DIAGNOSIS — Z122 Encounter for screening for malignant neoplasm of respiratory organs: Secondary | ICD-10-CM

## 2019-09-15 DIAGNOSIS — Z87891 Personal history of nicotine dependence: Secondary | ICD-10-CM

## 2019-09-15 DIAGNOSIS — Z720 Tobacco use: Secondary | ICD-10-CM

## 2019-09-15 NOTE — Telephone Encounter (Signed)
Order placed

## 2019-09-15 NOTE — Telephone Encounter (Signed)
-----   Message from [place], MD sent at 09/15/2019  4:16 PM EDT ----- Regarding: FW: CT Lung screen clinic Jolynda Townley, can you refer patient to receive Lung Screen at [facility]?  Opal Sidles ----- Message ----- From: Magdalen Spatz, NP Sent: 09/15/2019   9:53 AM EDT To: Margaretha Seeds, MD Subject: RE: CT Lung screen clinic                      The patient can get screened at [facility] There is actually a lung screening program there. Just have Edger Husain place the Ambulatory referral to [place] and select [place] as the location. Also the number there is [phone] if she has any trouble. We will have access to all CT's. Thanks ----- Message ----- From: Margaretha Seeds, MD Sent: 09/12/2019  11:39 AM EDT To: Amado Coe, RN, Magdalen Spatz, NP Subject: CT Lung screen clinic                          Hi Sarah,  I have referred this patient to your lung cancer screening clinic. Is the patient able to get the imaging done at [facility]? Will we have access to this records? If not, patient would rather have imaging done here.  JE

## 2019-09-21 ENCOUNTER — Telehealth: Payer: Self-pay | Admitting: *Deleted

## 2019-09-21 NOTE — Telephone Encounter (Signed)
Contacted patient regarding lung screening scan. Patient is out of town with husband and will call to schedule when she is back in town.

## 2019-09-21 NOTE — Telephone Encounter (Signed)
Received referral for low dose lung cancer screening CT scan. Message left at phone number listed in EMR for patient to call me back to facilitate scheduling scan.  

## 2019-09-26 ENCOUNTER — Telehealth: Payer: Self-pay | Admitting: *Deleted

## 2019-09-26 NOTE — Telephone Encounter (Signed)
Called and spoke with patient's husband (DPR) and let him know patient doesn't qualify at this time.   He voiced understanding  Nothing further needed

## 2019-09-26 NOTE — Telephone Encounter (Signed)
-----   Message from [place], MD sent at 09/21/2019  1:20 PM EDT ----- Regarding: RE: Lung Cancer Screening Thanks for letting us know.  Shelby Peltz, could you please let patient know that she does not qualify for lung cancer screening at this time.  Thanks,  JE ----- Message ----- From: Magdalen Spatz, NP Sent: 09/21/2019   1:06 PM EDT To: Margaretha Seeds, MD Subject: Lung Cancer Screening                          Jane,  I recently shared with you that there is indeed Lung Cancer Screening at [facility]. This patient , however, does not Qualify as she has newly diagnosed breast cancer. Cancer diagnosis within 5 years is an exclusion criteria. The fear of Medicare and insurances is that these screening scans will be used as routine surveillance scanning for active cancers and that is not allowed. I wanted to make sure you knew this as this patient will not qualify no matter where she screens. I don't agree with this, but  it is strictly monitored. Sorry I don't have better news.  Thanks,  Judson Roch

## 2020-01-02 ENCOUNTER — Other Ambulatory Visit: Payer: Self-pay | Admitting: Pulmonary Disease

## 2020-01-02 NOTE — Telephone Encounter (Signed)
Dr. Loanne Drilling, please advise if you are okay refilling med for pt.

## 2020-01-02 NOTE — Telephone Encounter (Signed)
OK to refill. Please also ensure patient has follow-up with me or NP for routine follow-up this year

## 2020-01-05 ENCOUNTER — Encounter: Payer: Self-pay | Admitting: *Deleted

## 2020-03-26 ENCOUNTER — Ambulatory Visit: Payer: Medicare Other | Admitting: Pulmonary Disease

## 2020-03-26 NOTE — Progress Notes (Deleted)
Synopsis: Referred in 10/2018 for COPD  Subjective:   PATIENT ID: Elaine Clayton GENDER: female DOB: 1946/01/07, MRN: [id number]   HPI  No chief complaint on file.  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  Since our last visit, she has seen by [facility] NP in June with stable lung issues at the time. She is compliant with her Breo and Spiriva. She has used her Albuterol nebulizer twice in the last few months. She denies ED/urgent care visits for COPD exacerbations in the last year. Pre-pandemic, she was participating in pool exercises. She is still walking/swimming a few times a week when they are able to schedule a reservation with the pool. Living in a retirement center with her husband. Has shortness of breath with high levels of exertion. Overall feels her COPD is well-controlled.  Former smoker. 35 pack-years. Quit in 2009.  Environmental exposures: None Retired Marine scientist.  Husband previously worked as a Advice worker.  I have personally reviewed patient's past medical/family/social history/allergies/current medications.  Allergies  Allergen Reactions  . Influenza Vaccines Anaphylaxis  . Flu Virus Vaccine Other (See Comments)    Muscle spams  . Gabapentin Anxiety  . Sulfa Antibiotics Rash  . Tetracyclines & Related Rash     Outpatient Medications Prior to Visit  Medication Sig Dispense Refill  . albuterol (PROVENTIL) (2.5 MG/3ML) 0.083% nebulizer solution Take 3 mLs (2.5 mg total) by nebulization every 6 (six) hours as needed for wheezing or shortness of breath. 360 mL 6  . ALPRAZolam (XANAX) 0.5 MG tablet Take 0.5-1 mg by mouth See admin instructions. Take 1 tablet in the AM and 1/2 tablet at bedtime.    . AZOPT 1 % ophthalmic suspension Place 1 drop into the left eye daily.   0  . benzonatate (TESSALON) 100 MG capsule Take 1 capsule (100 mg total) by  mouth 3 (three) times daily as needed for cough. 90 capsule 0  . bisacodyl (DULCOLAX) 5 MG EC tablet Take 5 mg by mouth daily as needed for moderate constipation.    . Brimonidine Tartrate-Timolol (COMBIGAN OP) Apply 1 drop to eye 2 (two) times daily. Both eyes    . buPROPion (WELLBUTRIN SR) 150 MG 12 hr tablet Take 300 mg by mouth daily.     Marland Kitchen dextromethorphan 15 MG/5ML syrup Take 10 mLs by mouth 4 (four) times daily as needed for cough.    . escitalopram (LEXAPRO) 10 MG tablet Take 15 mg by mouth at bedtime.   0  . esomeprazole (NEXIUM) 40 MG capsule Take 40 mg by mouth daily.    . fluticasone furoate-vilanterol (BREO ELLIPTA) 100-25 MCG/INH AEPB Inhale 1 puff into the lungs daily. 1 each 5  . ipratropium (ATROVENT) 0.06 % nasal spray Place 2 sprays into both nostrils 4 (four) times daily as needed for rhinitis.    Marland Kitchen LUMIGAN 0.01 % SOLN Place 1 drop into both eyes at bedtime.   0  . metoprolol succinate (TOPROL-XL) 50 MG 24 hr tablet Take 12.5 mg by mouth daily.    . mirabegron ER (MYRBETRIQ) 25 MG TB24 tablet Take 1 tablet (25 mg total) by mouth daily. 90 tablet 3  . nicotine polacrilex (NICORETTE) 2 MG gum Take 2 mg by mouth as needed for smoking cessation.    . ondansetron (ZOFRAN) 4 MG tablet Take 4 mg by mouth every 6 (six) hours as needed for  nausea/vomiting.    . rosuvastatin (CRESTOR) 10 MG tablet Take 10 mg by mouth at bedtime.     . tamoxifen (NOLVADEX) 20 MG tablet Take 1 tablet (20 mg total) by mouth daily. 30 tablet 12  . Tiotropium Bromide Monohydrate (SPIRIVA RESPIMAT) 2.5 MCG/ACT AERS Inhale 2 Inhalers into the lungs daily. 4 g 6  . Tiotropium Bromide Monohydrate (SPIRIVA RESPIMAT) 2.5 MCG/ACT AERS Inhale 2 puffs into the lungs daily. 4 g 0   No facility-administered medications prior to visit.    Review of Systems  Constitutional: Negative for chills, diaphoresis, fever, malaise/fatigue and weight loss.  HENT: Negative for congestion, ear pain and sore throat.     Respiratory: Negative for cough, hemoptysis, sputum production, shortness of breath and wheezing.   Cardiovascular: Negative for chest pain, palpitations and leg swelling.  Gastrointestinal: Negative for abdominal pain, heartburn and nausea.  Genitourinary: Negative for frequency.  Musculoskeletal: Negative for joint pain and myalgias.  Skin: Negative for itching and rash.  Neurological: Negative for dizziness, weakness and headaches. Tingling:   Endo/Heme/Allergies: Does not bruise/bleed easily.  Psychiatric/Behavioral: Negative for depression. The patient is not nervous/anxious.      Objective:    There were no vitals filed for this visit.     Physical Exam: General: Well-appearing, no acute distress HENT: [place], AT, OP clear, MMM Eyes: EOMI, no scleral icterus Respiratory: Decreased breath sounds bilaterally. No crackles, wheezing or rales Cardiovascular: RRR, -M/R/G, no JVD GI: BS+, soft, nontender Extremities:-Edema,-tenderness Neuro: AAO x4, CNII-XII grossly intact Skin: Intact, no rashes or bruising Psych: Normal mood, normal affect  Chest imaging: CXR 10/25/15 - Hyperinflated lung fields. No airspace disease.  PFT: 10/16/18 - Ratio 58 FEV1 1.37L (57%) FVC 2.31L (73%)  Moderately severe obstructive defect with air trapping and severely decreased DLCO consistent with emphysema  Imaging, labs and test noted above have been reviewed independently by me.  Assessment & Plan:   74 year old female with moderately severe COPD GOLD Class B  COPD, moderately severe (FEV1 57%) --CONTINUE Spiriva Respimat 2.5 mcg two inhalations daily. Refilled --CONTINUE Breo 100-25 mcg one puff daily.  Refilled --CONTINUE Albuterol inhaler 2 puffs every 4 hours as needed for shortness of breath or wheezing.  --Re-refer to Lung Cancer clinic. Discussed risks and benefits of evaluation. She wishes to pursue imaging preferably near home if this can be arranged.  No orders of the defined types  were placed in this encounter.   No follow-ups on file.  Greater than 50% of this patient 25-minute office visit was spent face-to-face in counseling with the patient/family. We discussed medical diagnosis and treatment plan as noted.  Greogory Cornette Rodman Pickle, MD [facility] 03/26/2020 9:00 AM  Personal pager: [phone] If unanswered, please page CCM On-call: [phone]

## 2020-04-26 ENCOUNTER — Ambulatory Visit: Payer: Medicare PPO | Admitting: Pulmonary Disease

## 2020-05-10 ENCOUNTER — Other Ambulatory Visit: Payer: Self-pay | Admitting: Urology

## 2020-05-17 ENCOUNTER — Telehealth: Payer: Self-pay

## 2020-05-17 DIAGNOSIS — N3941 Urge incontinence: Secondary | ICD-10-CM

## 2020-05-17 MED ORDER — MIRABEGRON ER 25 MG PO TB24: 25.0000 mg | ORAL_TABLET | Freq: Every day | ORAL | 0 refills | Status: DC

## 2020-05-17 NOTE — Telephone Encounter (Signed)
Incoming call from pt's husband requesting short term rx to last until pt's appt, rx sent.

## 2020-05-29 ENCOUNTER — Ambulatory Visit (INDEPENDENT_AMBULATORY_CARE_PROVIDER_SITE_OTHER): Payer: Medicare PPO | Admitting: Urology

## 2020-05-29 ENCOUNTER — Encounter: Payer: Self-pay | Admitting: Urology

## 2020-05-29 ENCOUNTER — Other Ambulatory Visit: Payer: Self-pay

## 2020-05-29 VITALS — BP 94/52 | HR 74 | Ht 64.0 in | Wt 111.0 lb

## 2020-05-29 DIAGNOSIS — R32 Unspecified urinary incontinence: Secondary | ICD-10-CM | POA: Diagnosis not present

## 2020-05-29 DIAGNOSIS — N3941 Urge incontinence: Secondary | ICD-10-CM | POA: Diagnosis not present

## 2020-05-29 DIAGNOSIS — R350 Frequency of micturition: Secondary | ICD-10-CM

## 2020-05-29 LAB — BLADDER SCAN AMB NON-IMAGING: Scan Result: 280

## 2020-05-29 MED ORDER — GEMTESA 75 MG PO TABS: 75.0000 mg | ORAL_TABLET | Freq: Every day | ORAL | 11 refills | Status: DC

## 2020-05-29 MED ORDER — GEMTESA 75 MG PO TABS: 75.0000 mg | ORAL_TABLET | Freq: Every day | ORAL | 3 refills | Status: DC

## 2020-05-29 NOTE — Progress Notes (Signed)
05/22/20 4:22 PM   Elaine Clayton Apr 27, 1946 [id number]  Referring provider: Idelle Crouch, MD [facility] [place],  [place] [address] Chief Complaint  Patient presents with  . Urinary Incontinence    HPI: Elaine Clayton is a 74 y.o. white female who presents today for urinary incontinence and a medication refill.  Cystoscopy performed on 09/23/2017 noted mild urethral stricture.  Cytology was negative.  She had been being maintained on Myrbetriq 25 mg every other day, but recently her urinary symptoms worsen and she increased the Myrbetriq to twice daily.  She stated that she has noted significant improvement since increasing her dosage.  The patient is  experiencing urgency x 4-7 (stable), frequency x 4-7 (stable), is restricting fluids to avoid visits to the restroom, is engaging in toilet mapping, incontinence x 4-7 (worse) and nocturia x 0-3 (stable).   Her BP is 94/52.   Her PVR is 280 mL.  Patient denies any modifying or aggravating factors.  Patient denies any gross hematuria, dysuria or suprapubic/flank pain.  Patient denies any fevers, chills, nausea or vomiting.    PMH: Past Medical History:  Diagnosis Date  . Anemia   . Ataxia    Apaxia  . Bipolar affective disorder ([place])    2  . Breast cancer of upper-outer quadrant of left female breast ([place]) 05/09/2019   LEFT; Invasive LOBULAR carcinoma,17 mm, T1c, N0, multifocal, lymphovascular invasion noted.  ER: 90%; PR: Neg, Her 2 neu not overexpressed.   . Broken foot 2015   right foot  . Chronic gastritis   . Chronic kidney disease    Dr Holley Raring  . COPD (chronic obstructive pulmonary disease) ([place])   . Dizziness    inner ear (per pt) several times per week  . Dysphagia   . Falls frequently   . Glaucoma   . Hyperlipemia   . Hypertension    pt has recently come off BP meds.  MD aware. BP seems stable.  . Hyperthyroidism   . Lithium toxicity 2015  . Malignant neoplasm of upper-outer  quadrant of female breast ([place]) 05/07/2007   RIGHT: tubular carcinoma, 1 mm, T1a,Nx, Wide excision, whole breast radiation.   . Memory difficulty   . Neck stiffness    s/p C3-C4 fusion, limited right turn  . Osteoarthritis    back  . Pancreatitis   . Parkinson disease ([place])   . Pituitary microadenoma ([place])   . Recurrent UTI   . Renal insufficiency   . Stomach ulcer 2112    Surgical History: Past Surgical History:  Procedure Laterality Date  . APPENDECTOMY    . BREAST BIOPSY Right 2011   neg  . BREAST BIOPSY Left 02/09/2019   affirm stereo 2 areas/group 1 x clip/ group 2 coil clip/path pending  . BREAST EXCISIONAL BIOPSY Right 2001   neg  . 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 BREAST EXCISIONAL BIOPSY Left 10/26/2012   neg  . BREAST SURGERY Left  2013   left breast wide excision,Intraductal papilloma, ductal hyperplasia and sclerosing adenosis. Microcalcifications associated with columnar cell change. No evidence of atypia or malignancy. Margins are unremarkable.  Marland Kitchen BREAST SURGERY Right November 06, 1999   multiple areas of microcalcification showing evidence of sclerosing adenosis and ductal hyperplasia.  Marland Kitchen BREAST SURGERY Left May 07, 2007   wide excision.  . cataract Right 2013  . CATARACT EXTRACTION W/PHACO Left 03/05/2016   Procedure:  CATARACT EXTRACTION PHACO AND INTRAOCULAR LENS PLACEMENT (IOC);  Surgeon: Leandrew Koyanagi, MD;  Location: [facility];  Service: Ophthalmology;  Laterality: Left;  . COLON SURGERY    . COLONOSCOPY  2004  . COLONOSCOPY N/A 05/14/2015   Procedure: COLONOSCOPY;  Surgeon: Manya Silvas, MD;  Location: [facility] ENDOSCOPY;  Service: Endoscopy;  Laterality: N/A;  . ERCP N/A   . ESOPHAGOGASTRODUODENOSCOPY N/A 05/14/2015   Procedure: ESOPHAGOGASTRODUODENOSCOPY (EGD);  Surgeon: Manya Silvas, MD;  Location: [facility] ENDOSCOPY;  Service: Endoscopy;  Laterality: N/A;  . EUS N/A 2011, 2012    . EYE SURGERY  2013  . GLAUCOMA SURGERY Right 2013  . LAPAROSCOPIC RIGHT COLECTOMY Right 06/22/2015   LAPAROSCOPIC RIGHT COLECTOMY;  Surgeon: Robert Bellow, MD;  Location: [facility] ORS; 1.6 cm tubular adenoma.  Marland Kitchen NECK SURGERY  2006   C3-4 fusion  . right breast cancer    . SAVORY DILATION N/A 05/14/2015   Procedure: SAVORY DILATION;  Surgeon: Manya Silvas, MD;  Location: [facility] ENDOSCOPY;  Service: Endoscopy;  Laterality: N/A;  . SIMPLE MASTECTOMY WITH AXILLARY SENTINEL NODE BIOPSY Left 05/09/2019   Procedure: LEFT SIMPLE MASTECTOMY WITH SENTINEL LYMPH NODE BIOPSY LEFT - Verified injection at 11:00;  Surgeon: Robert Bellow, MD;  Location: [facility] ORS;  Service: General;  Laterality: Left;  . TRABECULECTOMY Left 03/05/2016   Procedure: TRABECULECTOMY WITH [facility] AND EXPRESS SHUNT;  Surgeon: Leandrew Koyanagi, MD;  Location: [facility];  Service: Ophthalmology;  Laterality: Left;  . TUBAL LIGATION      Home Medications:  Allergies as of 05/29/2020      Reactions   Influenza Vaccines Anaphylaxis   Flu Virus Vaccine Other (See Comments)   Muscle spams   Gabapentin Anxiety   Sulfa Antibiotics Rash   Tetracyclines & Related Rash      Medication List       Accurate as of May 29, 2020  4:22 PM. If you have any questions, ask your nurse or doctor.        STOP taking these medications   mirabegron ER 25 MG Tb24 tablet Commonly known as: Myrbetriq Stopped by: Zara Council, PA-C     TAKE these medications   albuterol (2.5 MG/3ML) 0.083% nebulizer solution Commonly known as: PROVENTIL Take 3 mLs (2.5 mg total) by nebulization every 6 (six) hours as needed for wheezing or shortness of breath.   ALPRAZolam 0.5 MG tablet Commonly known as: XANAX Take 0.5-1 mg by mouth See admin instructions. Take 1 tablet in the AM and 1/2 tablet at bedtime.   Azopt 1 % ophthalmic suspension Generic drug: brinzolamide Place 1 drop into the left eye daily.   benzonatate 100 MG  capsule Commonly known as: TESSALON Take 1 capsule (100 mg total) by mouth 3 (three) times daily as needed for cough.   bisacodyl 5 MG EC tablet Commonly known as: DULCOLAX Take 5 mg by mouth daily as needed for moderate constipation.   Breo Ellipta 100-25 MCG/INH Aepb Generic drug: fluticasone furoate-vilanterol Inhale 1 puff into the lungs daily.   buPROPion 150 MG 12 hr tablet Commonly known as: WELLBUTRIN SR Take 300 mg by mouth daily.   COMBIGAN OP Apply 1 drop to eye 2 (two) times daily. Both eyes   dextromethorphan 15 MG/5ML syrup Take 10 mLs by mouth 4 (four) times daily as needed for cough.   escitalopram 10 MG tablet Commonly known as: LEXAPRO Take 15 mg by mouth at bedtime.   esomeprazole 40 MG capsule Commonly known  as: NEXIUM Take 40 mg by mouth daily.   Gemtesa 75 MG Tabs Generic drug: Vibegron Take 75 mg by mouth daily. Started by: Zara Council, PA-C   ipratropium 0.06 % nasal spray Commonly known as: ATROVENT Place 2 sprays into both nostrils 4 (four) times daily as needed for rhinitis.   Lumigan 0.01 % Soln Generic drug: bimatoprost Place 1 drop into both eyes at bedtime.   metoprolol succinate 50 MG 24 hr tablet Commonly known as: TOPROL-XL Take 12.5 mg by mouth daily.   nicotine polacrilex 2 MG gum Commonly known as: NICORETTE Take 2 mg by mouth as needed for smoking cessation.   ondansetron 4 MG tablet Commonly known as: ZOFRAN Take 4 mg by mouth every 6 (six) hours as needed for nausea/vomiting.   rosuvastatin 10 MG tablet Commonly known as: CRESTOR Take 10 mg by mouth at bedtime.   Spiriva Respimat 2.5 MCG/ACT Aers Generic drug: Tiotropium Bromide Monohydrate Inhale 2 Inhalers into the lungs daily.   Spiriva Respimat 2.5 MCG/ACT Aers Generic drug: Tiotropium Bromide Monohydrate Inhale 2 puffs into the lungs daily.   tamoxifen 20 MG tablet Commonly known as: NOLVADEX Take 1 tablet (20 mg total) by mouth daily.        Allergies:  Allergies  Allergen Reactions  . Influenza Vaccines Anaphylaxis  . Flu Virus Vaccine Other (See Comments)    Muscle spams  . Gabapentin Anxiety  . Sulfa Antibiotics Rash  . Tetracyclines & Related Rash    Family History: Family History  Problem Relation Age of Onset  . Breast cancer Mother 15  . Kidney cancer Neg Hx   . Bladder Cancer Neg Hx     Social History:  reports that she quit smoking about 12 years ago. Her smoking use included cigarettes. She started smoking about 55 years ago. She has a 43.00 pack-year smoking history. She has never used smokeless tobacco. She reports current alcohol use. She reports that she does not use drugs.   Physical Exam: BP (!) 94/52   Pulse 74   Ht 5\' 4"  (1.626 m)   Wt 111 lb (50.3 kg)   BMI 19.05 kg/m   Constitutional:  Alert and oriented, No acute distress. HEENT: [place] AT, mask in place. Trachea midline. Cardiovascular: No clubbing, cyanosis, or edema. Respiratory: Normal respiratory effort, no increased work of breathing. Neurologic: Grossly intact, no focal deficits, moving all 4 extremities. Psychiatric: Normal mood and affect.  Laboratory Data: Serum creatinine 1.5 11/24/2019 UA negative 06/2019 I have reviewed the labs.    Pertinent Imaging Recent Results (from the past 2160 hour(s))  Bladder Scan (Post Void Residual) in office     Status: None   Collection Time: 05/29/20  2:40 PM  Result Value Ref Range   Scan Result 280      Assessment & Plan:    1. Urge incontinence BLADDER SCAN AMB NON-IMAGING We will have patient discontinue the Myrbetriq as it interferes with tamoxifen making it less effective We will start the gemtesa 75 mg, she willl take half a tablet in the morning and half one in the evening She will return in 3 months for OAB questionnaire and PVR  2. Urinary frequency As above  3. Enuresis Resolved with medication  [facility] [address], [place], [place] [address] [phone]  I, Joneen Boers Peace, am acting as a Education administrator for [employer].  I have reviewed the above documentation for accuracy and completeness, and I agree with the  above.    Zara Council, PA-C

## 2020-09-03 NOTE — Progress Notes (Signed)
05/22/20 2:22 PM   Elaine Clayton Oct 01, 1946 [id number]  Referring provider: Idelle Crouch, MD [place] [facility] [place],  [place] [address] Chief Complaint  Patient presents with  . Urinary Incontinence    HPI: Elaine Clayton is a 74 y.o. female with urge incontinence who presents today for follow-up after being placed on Gemtesa.    Cystoscopy performed on 09/23/2017 noted mild urethral stricture.  Cytology was negative.  She had been being maintained on Myrbetriq 50 mg, but it interacts with her tamoxifen making the tamoxifen less effective.  She was switched to Gemtesa 75 mg daily.    The patient is experiencing urgency x 0-3 (improved), frequency x 4-7 (stable), is restricting fluids to avoid visits to the restroom, is engaging in toilet mapping, incontinence x 0-3 (improved) and nocturia x 0-3 (stable).   Her BP is 108/65.   Her PVR is > 526 mL.    She mentions today that she has been noting a bloody discharge in her pads.  She states it happens infrequently and it happened most recently 1 month ago.  She states she feels fine and is not concerned.  Patient denies any modifying or aggravating factors.  Patient denies any gross hematuria, dysuria or suprapubic/flank pain.  Patient denies any fevers, chills, nausea or vomiting.   CATH UA negative for micro heme  PMH: Past Medical History:  Diagnosis Date  . Anemia   . Ataxia    Apaxia  . Bipolar affective disorder ([place])    2  . Breast cancer of upper-outer quadrant of left female breast ([place]) 05/09/2019   LEFT; Invasive LOBULAR carcinoma,17 mm, T1c, N0, multifocal, lymphovascular invasion noted.  ER: 90%; PR: Neg, Her 2 neu not overexpressed.   . Broken foot 2015   right foot  . Chronic gastritis   . Chronic kidney disease    Dr Holley Raring  . COPD (chronic obstructive pulmonary disease) ([place])   . Dizziness    inner ear (per pt) several times per week  . Dysphagia   . Falls frequently   .  Glaucoma   . Hyperlipemia   . Hypertension    pt has recently come off BP meds.  MD aware. BP seems stable.  . Hyperthyroidism   . Lithium toxicity 2015  . Malignant neoplasm of upper-outer quadrant of female breast ([place]) 05/07/2007   RIGHT: tubular carcinoma, 1 mm, T1a,Nx, Wide excision, whole breast radiation.   . Memory difficulty   . Neck stiffness    s/p C3-C4 fusion, limited right turn  . Osteoarthritis    back  . Pancreatitis   . Parkinson disease ([place])   . Pituitary microadenoma ([place])   . Recurrent UTI   . Renal insufficiency   . Stomach ulcer 2112    Surgical History: Past Surgical History:  Procedure Laterality Date  . APPENDECTOMY    . BREAST BIOPSY Right 2011   neg  . BREAST BIOPSY Left 02/09/2019   affirm stereo 2 areas/group 1 x clip/ group 2 coil clip/path pending  . BREAST EXCISIONAL BIOPSY Right 2001   neg  . 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 BREAST EXCISIONAL BIOPSY Left 10/26/2012   neg  . BREAST SURGERY Left  2013   left breast wide excision,Intraductal papilloma, ductal hyperplasia and sclerosing adenosis. Microcalcifications associated with columnar cell change. No evidence of atypia or malignancy. Margins are unremarkable.  Marland Kitchen BREAST SURGERY Right November 06, 1999   multiple areas of microcalcification showing evidence of sclerosing adenosis and ductal hyperplasia.  Marland Kitchen BREAST SURGERY Left May 07, 2007   wide excision.  . cataract Right 2013  . CATARACT EXTRACTION W/PHACO Left 03/05/2016   Procedure: CATARACT EXTRACTION PHACO AND INTRAOCULAR LENS PLACEMENT (IOC);  Surgeon: Leandrew Koyanagi, MD;  Location: [place];  Service: Ophthalmology;  Laterality: Left;  . COLON SURGERY    . COLONOSCOPY  2004  . COLONOSCOPY N/A 05/14/2015   Procedure: COLONOSCOPY;  Surgeon: Manya Silvas, MD;  Location: [facility] ENDOSCOPY;  Service: Endoscopy;  Laterality: N/A;  . ERCP N/A   .  ESOPHAGOGASTRODUODENOSCOPY N/A 05/14/2015   Procedure: ESOPHAGOGASTRODUODENOSCOPY (EGD);  Surgeon: Manya Silvas, MD;  Location: [facility] ENDOSCOPY;  Service: Endoscopy;  Laterality: N/A;  . EUS N/A 2011, 2012  . EYE SURGERY  2013  . GLAUCOMA SURGERY Right 2013  . LAPAROSCOPIC RIGHT COLECTOMY Right 06/22/2015   LAPAROSCOPIC RIGHT COLECTOMY;  Surgeon: Robert Bellow, MD;  Location: [facility] ORS; 1.6 cm tubular adenoma.  Marland Kitchen NECK SURGERY  2006   C3-4 fusion  . right breast cancer    . SAVORY DILATION N/A 05/14/2015   Procedure: SAVORY DILATION;  Surgeon: Manya Silvas, MD;  Location: [facility] ENDOSCOPY;  Service: Endoscopy;  Laterality: N/A;  . SIMPLE MASTECTOMY WITH AXILLARY SENTINEL NODE BIOPSY Left 05/09/2019   Procedure: LEFT SIMPLE MASTECTOMY WITH SENTINEL LYMPH NODE BIOPSY LEFT - Verified injection at 11:00;  Surgeon: Robert Bellow, MD;  Location: [facility] ORS;  Service: General;  Laterality: Left;  . TRABECULECTOMY Left 03/05/2016   Procedure: TRABECULECTOMY WITH [facility] AND EXPRESS SHUNT;  Surgeon: Leandrew Koyanagi, MD;  Location: [place];  Service: Ophthalmology;  Laterality: Left;  . TUBAL LIGATION      Home Medications:  Allergies as of 09/04/2020      Reactions   Influenza Vaccines Anaphylaxis   Flu Virus Vaccine Other (See Comments)   Muscle spams   Gabapentin Anxiety   Sulfa Antibiotics Rash   Tetracyclines & Related Rash      Medication List       Accurate as of September 04, 2020  2:22 PM. If you have any questions, ask your nurse or doctor.        albuterol (2.5 MG/3ML) 0.083% nebulizer solution Commonly known as: PROVENTIL Take 3 mLs (2.5 mg total) by nebulization every 6 (six) hours as needed for wheezing or shortness of breath.   ALPRAZolam 0.5 MG tablet Commonly known as: XANAX Take 0.5-1 mg by mouth See admin instructions. Take 1 tablet in the AM and 1/2 tablet at bedtime.   Azopt 1 % ophthalmic suspension Generic drug: brinzolamide Place 1 drop into  the left eye daily.   benzonatate 100 MG capsule Commonly known as: TESSALON Take 1 capsule (100 mg total) by mouth 3 (three) times daily as needed for cough.   bisacodyl 5 MG EC tablet Commonly known as: DULCOLAX Take 5 mg by mouth daily as needed for moderate constipation.   Breo Ellipta 100-25 MCG/INH Aepb Generic drug: fluticasone furoate-vilanterol Inhale 1 puff into the lungs daily.   buPROPion 150 MG 12 hr tablet Commonly known as: WELLBUTRIN SR Take 300 mg by mouth daily.   COMBIGAN OP Apply 1 drop to eye 2 (two) times daily. Both eyes   dextromethorphan 15 MG/5ML syrup Take 10 mLs by mouth 4 (four) times daily as needed for cough.   escitalopram 10 MG tablet Commonly known as: LEXAPRO Take 15 mg  by mouth at bedtime.   esomeprazole 40 MG capsule Commonly known as: NEXIUM Take 40 mg by mouth daily.   Gemtesa 75 MG Tabs Generic drug: Vibegron Take 75 mg by mouth daily.   ipratropium 0.06 % nasal spray Commonly known as: ATROVENT Place 2 sprays into both nostrils 4 (four) times daily as needed for rhinitis.   Lumigan 0.01 % Soln Generic drug: bimatoprost Place 1 drop into both eyes at bedtime.   metoprolol succinate 50 MG 24 hr tablet Commonly known as: TOPROL-XL Take 12.5 mg by mouth daily.   nicotine polacrilex 2 MG gum Commonly known as: NICORETTE Take 2 mg by mouth as needed for smoking cessation.   ondansetron 4 MG tablet Commonly known as: ZOFRAN Take 4 mg by mouth every 6 (six) hours as needed for nausea/vomiting.   rosuvastatin 10 MG tablet Commonly known as: CRESTOR Take 10 mg by mouth at bedtime.   Spiriva Respimat 2.5 MCG/ACT Aers Generic drug: Tiotropium Bromide Monohydrate Inhale 2 Inhalers into the lungs daily.   Spiriva Respimat 2.5 MCG/ACT Aers Generic drug: Tiotropium Bromide Monohydrate Inhale 2 puffs into the lungs daily.   tamoxifen 20 MG tablet Commonly known as: NOLVADEX Take 1 tablet (20 mg total) by mouth daily.         Allergies:  Allergies  Allergen Reactions  . Influenza Vaccines Anaphylaxis  . Flu Virus Vaccine Other (See Comments)    Muscle spams  . Gabapentin Anxiety  . Sulfa Antibiotics Rash  . Tetracyclines & Related Rash    Family History: Family History  Problem Relation Age of Onset  . Breast cancer Mother 56  . Kidney cancer Neg Hx   . Bladder Cancer Neg Hx     Social History:  reports that she quit smoking about 12 years ago. Her smoking use included cigarettes. She started smoking about 55 years ago. She has a 43.00 pack-year smoking history. She has never used smokeless tobacco. She reports current alcohol use. She reports that she does not use drugs.  ROS For pertinent review of systems please refer to history of present illness  Physical Exam: BP 108/65   Pulse 68   Ht 5\' 4"  (1.626 m)   Wt 111 lb (50.3 kg)   BMI 19.05 kg/m   Constitutional:  Well nourished. Alert and oriented, No acute distress. HEENT: [place] AT, mask in place.  Trachea midline Cardiovascular: No clubbing, cyanosis, or edema. Respiratory: Normal respiratory effort, no increased work of breathing. GU: No CVA tenderness.  No bladder fullness or masses.  Atrophic external genitalia, sparse pubic hair distribution, no lesions.  Normal urethral meatus, no lesions, no prolapse, no discharge.   No urethral masses, tenderness and/or tenderness. No bladder fullness, tenderness or masses. pale vagina mucosa, fair estrogen effect, no discharge, no lesions, fair pelvic support, no cystocele and no rectocele noted.  No cervical motion tenderness.  Uterus is freely mobile and non-fixed.  No adnexal/parametria masses or tenderness noted.  Anus and perineum are without rashes or lesions.    Skin: No rashes, bruises or suspicious lesions. Lymph: No cervical or inguinal adenopathy. Neurologic: Grossly intact, no focal deficits, moving all 4 extremities. Psychiatric: Normal mood and affect.   Laboratory Data: Serum  creatinine 1.5 11/24/2019 CATH UA  Component     Latest Ref Rng & Units 09/04/2020  Specific Gravity, UA     1.005 - 1.030 1.010  pH, UA     5.0 - 7.5 5.5  Color, UA  Yellow Yellow  Appearance Ur     Clear Clear  Leukocytes,UA     Negative Negative  Protein,UA     Negative/Trace Negative  Glucose, UA     Negative Negative  Ketones, UA     Negative Negative  RBC, UA     Negative Negative  Bilirubin, UA     Negative Negative  Urobilinogen, Ur     0.2 - 1.0 mg/dL 0.2  Nitrite, UA     Negative Negative  Microscopic Examination      See below:   Component     Latest Ref Rng & Units 09/04/2020  WBC, UA     0 - 5 /hpf 0-5  RBC     0 - 2 /hpf 0-2  Epithelial Cells (non renal)     0 - 10 /hpf 0-10  Casts     None seen /lpf Present (A)  Cast Type     N/A Hyaline casts  Crystals     N/A Present (A)  Crystal Type     N/A Calcium Oxalate  Bacteria, UA     None seen/Few Moderate (A)   I have reviewed the labs.    Pertinent Imaging Recent Results (from the past 2160 hour(s))  Bladder Scan (Post Void Residual) in office     Status: None   Collection Time: 09/04/20  1:13 PM  Result Value Ref Range   Scan Result >526    In and Out Catheterization Patient is present today for a I & O catheterization due to urinary retention. Patient was cleaned and prepped in a sterile fashion with betadine . A 16 FR cath was inserted no complications were noted , 600 ml of urine return was noted, urine was yellow in color. A clean urine sample was collected for UA. Bladder was drained  And catheter was removed with out difficulty.    Performed by: Zara Council, PA-C and Elberta Leatherwood, CMA   Assessment & Plan:    1.  Urinary retention Recommended placement of an indwelling Foley for bladder decompression, but patient declined.  I did advise her of the possible risk of kidney injury with elevated PVRs.  She is also not interested in learning self-catheterization.   We  compromised on discontinuing the Gemtesa and straight cathing her today in the office to decompress her bladder. She return in 1 month with follow-up PVR  2. Blood on pad Patient is not certain whether the blood on the pad is urinary, vaginal or rectal in nature.  I did not identify any frank bleeding during her pelvic exam.  I advised the patient and her husband regarding the recommendations regarding gross hematuria and advised a CT urogram and cystoscopy at this time for further evaluation.  The UA is not available at the time of this conversation, but I stated regardless of whether or not there was microscopic hematuria on the UA specimen my recommendation stands.  I also advised that if the hematuria work-up was negative, we would need to refer her onto gynecology as if the blood on the pad represents postmenopausal bleeding that could also be the result of a uterine cancer.  If the hematuria work-up and the gynecological work-up is negative, the next step would be a GI evaluation.  Patient and her husband would like to hold off on this at this time unless the blood on the pad becomes more frequent or is associated with pain.  I did advise that delaying a cancer diagnosis would limit  treatment options in the future if it is allowed to metastasize.  They understand these risks and we will discuss further once UA is available and at their return appointment in 1 month.  3. Urge incontinence Patient with an elevated PVR on the Gemtesa Will discontinue the medication at this time   [facility] [address] [place], [place] [address] [phone]

## 2020-09-04 ENCOUNTER — Encounter: Payer: Self-pay | Admitting: Urology

## 2020-09-04 ENCOUNTER — Telehealth: Payer: Self-pay | Admitting: Urology

## 2020-09-04 ENCOUNTER — Ambulatory Visit: Payer: Medicare PPO | Admitting: Urology

## 2020-09-04 ENCOUNTER — Other Ambulatory Visit: Payer: Self-pay

## 2020-09-04 VITALS — BP 108/65 | HR 68 | Ht 64.0 in | Wt 111.0 lb

## 2020-09-04 DIAGNOSIS — N3941 Urge incontinence: Secondary | ICD-10-CM | POA: Diagnosis not present

## 2020-09-04 DIAGNOSIS — R339 Retention of urine, unspecified: Secondary | ICD-10-CM | POA: Diagnosis not present

## 2020-09-04 DIAGNOSIS — R319 Hematuria, unspecified: Secondary | ICD-10-CM | POA: Diagnosis not present

## 2020-09-04 DIAGNOSIS — R350 Frequency of micturition: Secondary | ICD-10-CM | POA: Diagnosis not present

## 2020-09-04 LAB — MICROSCOPIC EXAMINATION

## 2020-09-04 LAB — URINALYSIS, COMPLETE
Bilirubin, UA: NEGATIVE
Glucose, UA: NEGATIVE
Ketones, UA: NEGATIVE
Leukocytes,UA: NEGATIVE
Nitrite, UA: NEGATIVE
Protein,UA: NEGATIVE
RBC, UA: NEGATIVE
Specific Gravity, UA: 1.01 (ref 1.005–1.030)
Urobilinogen, Ur: 0.2 mg/dL (ref 0.2–1.0)
pH, UA: 5.5 (ref 5.0–7.5)

## 2020-09-04 LAB — BLADDER SCAN AMB NON-IMAGING: Scan Result: >526

## 2020-09-04 NOTE — Telephone Encounter (Signed)
Patient notified and voiced understanding.

## 2020-09-04 NOTE — Telephone Encounter (Signed)
Please let Mrs. Stach know that her urine had no blood.

## 2020-10-02 ENCOUNTER — Other Ambulatory Visit: Payer: Self-pay | Admitting: Pulmonary Disease

## 2020-10-08 NOTE — Progress Notes (Signed)
05/22/20 5:20 PM   [name] 1946/07/02 [id number]  Referring provider: Idelle Crouch, MD [place] [facility] [place],  [place] [address] Chief Complaint  Patient presents with  . Follow-up    HPI: Elaine Clayton is a 74 y.o. female with urge incontinence who presents today for follow-up after being placed on Gemtesa.    Cystoscopy performed on 09/23/2017 noted mild urethral stricture.  Cytology was negative.  She had been being maintained on Myrbetriq 50 mg, but it interacts with her tamoxifen making the tamoxifen less effective.  She was switched to Gemtesa 75 mg daily.  Her PVR increased, so she was asked to discontinue the [place]   Her PVR today is 255 mL.  UA is negative for micro heme.    She continues to have urgent urination and leakage of urine.  She is also having left side flank pain that radiates into the left lower abdomen.  Patient denies any modifying or aggravating factors.  Patient denies any dysuria.  Patient denies any fevers, chills, nausea or vomiting.   She states she has not had any more episodes of blood on her pads.    PMH: Past Medical History:  Diagnosis Date  . Anemia   . Ataxia    Apaxia  . Bipolar affective disorder ([place])    2  . Breast cancer of upper-outer quadrant of left female breast ([place]) 05/09/2019   LEFT; Invasive LOBULAR carcinoma,17 mm, T1c, N0, multifocal, lymphovascular invasion noted.  ER: 90%; PR: Neg, Her 2 neu not overexpressed.   . Broken foot 2015   right foot  . Chronic gastritis   . Chronic kidney disease    Dr Holley Raring  . COPD (chronic obstructive pulmonary disease) ([place])   . Dizziness    inner ear (per pt) several times per week  . Dysphagia   . Falls frequently   . Glaucoma   . Hyperlipemia   . Hypertension    pt has recently come off BP meds.  MD aware. BP seems stable.  . Hyperthyroidism   . Lithium toxicity 2015  . Malignant neoplasm of upper-outer quadrant of female breast ([place])  05/07/2007   RIGHT: tubular carcinoma, 1 mm, T1a,Nx, Wide excision, whole breast radiation.   . Memory difficulty   . Neck stiffness    s/p C3-C4 fusion, limited right turn  . Osteoarthritis    back  . Pancreatitis   . Parkinson disease ([place])   . Pituitary microadenoma ([place])   . Recurrent UTI   . Renal insufficiency   . Stomach ulcer 2112    Surgical History: Past Surgical History:  Procedure Laterality Date  . APPENDECTOMY    . BREAST BIOPSY Right 2011   neg  . BREAST BIOPSY Left 02/09/2019   affirm stereo 2 areas/group 1 x clip/ group 2 coil clip/path pending  . BREAST EXCISIONAL BIOPSY Right 2001   neg  . 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 BREAST EXCISIONAL BIOPSY Left 10/26/2012   neg  . BREAST SURGERY Left  2013   left breast wide excision,Intraductal papilloma, ductal hyperplasia and sclerosing adenosis. Microcalcifications associated with columnar cell change. No evidence of atypia or malignancy. Margins are unremarkable.  Marland Kitchen BREAST SURGERY Right November 06, 1999   multiple areas of microcalcification showing evidence of sclerosing adenosis and ductal hyperplasia.  Marland Kitchen BREAST SURGERY Left May 07, 2007   wide excision.  . cataract Right 2013  .  CATARACT EXTRACTION W/PHACO Left 03/05/2016   Procedure: CATARACT EXTRACTION PHACO AND INTRAOCULAR LENS PLACEMENT (IOC);  Surgeon: Leandrew Koyanagi, MD;  Location: [place];  Service: Ophthalmology;  Laterality: Left;  . COLON SURGERY    . COLONOSCOPY  2004  . COLONOSCOPY N/A 05/14/2015   Procedure: COLONOSCOPY;  Surgeon: Manya Silvas, MD;  [facility] ENDOSCOPY;  Service: Endoscopy;  Laterality: N/A;  . ERCP N/A   . ESOPHAGOGASTRODUODENOSCOPY N/A 05/14/2015   Procedure: ESOPHAGOGASTRODUODENOSCOPY (EGD);  Surgeon: Manya Silvas, MD;  Location: [facility] ENDOSCOPY;  Service: Endoscopy;  Laterality: N/A;  . EUS N/A 2011, 2012  . EYE SURGERY  2013  .  GLAUCOMA SURGERY Right 2013  . LAPAROSCOPIC RIGHT COLECTOMY Right 06/22/2015   LAPAROSCOPIC RIGHT COLECTOMY;  Surgeon: Robert Bellow, MD;  Location: [facility] ORS; 1.6 cm tubular adenoma.  Marland Kitchen NECK SURGERY  2006   C3-4 fusion  . right breast cancer    . SAVORY DILATION N/A 05/14/2015   Procedure: SAVORY DILATION;  Surgeon: Manya Silvas, MD;  Location: [facility] ENDOSCOPY;  Service: Endoscopy;  Laterality: N/A;  . SIMPLE MASTECTOMY WITH AXILLARY SENTINEL NODE BIOPSY Left 05/09/2019   Procedure: LEFT SIMPLE MASTECTOMY WITH SENTINEL LYMPH NODE BIOPSY LEFT - Verified injection at 11:00;  Surgeon: Robert Bellow, MD;  Location: [facility] ORS;  Service: General;  Laterality: Left;  . TRABECULECTOMY Left 03/05/2016   Procedure: TRABECULECTOMY WITH [facility] AND EXPRESS SHUNT;  Surgeon: Leandrew Koyanagi, MD;  Location: [place];  Service: Ophthalmology;  Laterality: Left;  . TUBAL LIGATION      Home Medications:  Allergies as of 10/09/2020      Reactions   Influenza Vaccines Anaphylaxis   Flu Virus Vaccine Other (See Comments)   Muscle spams   Gabapentin Anxiety   Sulfa Antibiotics Rash   Tetracyclines & Related Rash      Medication List       Accurate as of October 09, 2020 11:59 PM. If you have any questions, ask your nurse or doctor.        albuterol (2.5 MG/3ML) 0.083% nebulizer solution Commonly known as: PROVENTIL Take 3 mLs (2.5 mg total) by nebulization every 6 (six) hours as needed for wheezing or shortness of breath.   ALPRAZolam 0.5 MG tablet Commonly known as: XANAX Take 0.5-1 mg by mouth See admin instructions. Take 1 tablet in the AM and 1/2 tablet at bedtime.   Azopt 1 % ophthalmic suspension Generic drug: brinzolamide Place 1 drop into the left eye daily.   benzonatate 100 MG capsule Commonly known as: TESSALON Take 1 capsule (100 mg total) by mouth 3 (three) times daily as needed for cough.   bisacodyl 5 MG EC tablet Commonly known as: DULCOLAX Take 5 mg by  mouth daily as needed for moderate constipation.   Breo Ellipta 100-25 MCG/INH Aepb Generic drug: fluticasone furoate-vilanterol Inhale 1 puff into the lungs daily.   buPROPion 150 MG 12 hr tablet Commonly known as: WELLBUTRIN SR Take 300 mg by mouth daily.   COMBIGAN OP Apply 1 drop to eye 2 (two) times daily. Both eyes   dextromethorphan 15 MG/5ML syrup Take 10 mLs by mouth 4 (four) times daily as needed for cough.   escitalopram 10 MG tablet Commonly known as: LEXAPRO Take 15 mg by mouth at bedtime.   esomeprazole 40 MG capsule Commonly known as: NEXIUM Take 40 mg by mouth daily.   Gemtesa 75 MG Tabs Generic drug: Vibegron Take 75 mg by mouth daily.  ipratropium 0.06 % nasal spray Commonly known as: ATROVENT Place 2 sprays into both nostrils 4 (four) times daily as needed for rhinitis.   Lumigan 0.01 % Soln Generic drug: bimatoprost Place 1 drop into both eyes at bedtime.   metoprolol succinate 50 MG 24 hr tablet Commonly known as: TOPROL-XL Take 12.5 mg by mouth daily.   nicotine polacrilex 2 MG gum Commonly known as: NICORETTE Take 2 mg by mouth as needed for smoking cessation.   ondansetron 4 MG tablet Commonly known as: ZOFRAN Take 4 mg by mouth every 6 (six) hours as needed for nausea/vomiting.   rosuvastatin 10 MG tablet Commonly known as: CRESTOR Take 10 mg by mouth at bedtime.   Spiriva Respimat 2.5 MCG/ACT Aers Generic drug: Tiotropium Bromide Monohydrate Inhale 2 Inhalers into the lungs daily.   Spiriva Respimat 2.5 MCG/ACT Aers Generic drug: Tiotropium Bromide Monohydrate Inhale 2 puffs into the lungs daily.   tamoxifen 20 MG tablet Commonly known as: NOLVADEX Take 1 tablet (20 mg total) by mouth daily.   tamsulosin 0.4 MG Caps capsule Commonly known as: FLOMAX Take 0.4 mg by mouth every other day.       Allergies:  Allergies  Allergen Reactions  . Influenza Vaccines Anaphylaxis  . Flu Virus Vaccine Other (See Comments)     Muscle spams  . Gabapentin Anxiety  . Sulfa Antibiotics Rash  . Tetracyclines & Related Rash    Family History: Family History  Problem Relation Age of Onset  . Breast cancer Mother 59  . Kidney cancer Neg Hx   . Bladder Cancer Neg Hx     Social History:  reports that she quit smoking about 12 years ago. Her smoking use included cigarettes. She started smoking about 55 years ago. She has a 43.00 pack-year smoking history. She has never used smokeless tobacco. She reports current alcohol use. She reports that she does not use drugs.  ROS For pertinent review of systems please refer to history of present illness  Physical Exam: BP 127/74 (BP Location: Left Arm, Patient Position: Sitting, Cuff Size: Normal)   Pulse 76   Ht 5\' 5"  (1.651 m)   Wt 111 lb (50.3 kg)   BMI 18.47 kg/m   Constitutional:  Well nourished. Alert and oriented, No acute distress. HEENT: [place] AT, mask in place.  Trachea midline Cardiovascular: No clubbing, cyanosis, or edema. Respiratory: Normal respiratory effort, no increased work of breathing. Neurologic: Grossly intact, no focal deficits, moving all 4 extremities. Psychiatric: Normal mood and affect.   Laboratory Data: Serum creatinine 1.5 11/24/2019 Component     Latest Ref Rng & Units 10/09/2020  Specific Gravity, UA     1.005 - 1.030 1.015  pH, UA     5.0 - 7.5 6.5  Color, UA     Yellow Yellow  Appearance Ur     Clear Cloudy (A)  Leukocytes,UA     Negative 2+ (A)  Protein,UA     Negative/Trace Negative  Glucose, UA     Negative Negative  Ketones, UA     Negative Negative  RBC, UA     Negative Trace (A)  Bilirubin, UA     Negative Negative  Urobilinogen, Ur     0.2 - 1.0 mg/dL 0.2  Nitrite, UA     Negative Positive (A)  Microscopic Examination      See below:   Component     Latest Ref Rng & Units 10/09/2020  WBC, UA     0 -  5 /hpf >30 (A)  RBC     0 - 2 /hpf 0-2  Epithelial Cells (non renal)     0 - 10 /hpf 0-10  Crystals      N/A Present (A)  Crystal Type     N/A Amorphous Sediment  Bacteria, UA     None seen/Few Many (A)   I have reviewed the labs.    Pertinent Imaging Results for TREINA, ARSCOTT (MRN [id number]) as of 10/28/2020 17:22  Ref. Range 10/09/2020 13:20  Scan Result Unknown 2100mL    Assessment & Plan:    1.  Urinary retention PVR has improved off OAB agents although it remains elevated Will continue to monitor  2. Blood on pad UA is negative for micro heme We discussed undergoing a RUS for further evaluation of her flank pain and episodes of blood on her incontinence pads and they would like to proceed Follow up based on RUS results   3. Urge incontinence Likely due to incomplete bladder emptying Continue tamsulosin 0.4 mg daily   [place] [address] [place], [place] [address] [phone]

## 2020-10-09 ENCOUNTER — Ambulatory Visit: Payer: Medicare PPO | Admitting: Urology

## 2020-10-09 ENCOUNTER — Encounter: Payer: Self-pay | Admitting: Urology

## 2020-10-09 ENCOUNTER — Other Ambulatory Visit: Payer: Self-pay

## 2020-10-09 VITALS — BP 127/74 | HR 76 | Ht 65.0 in | Wt 111.0 lb

## 2020-10-09 DIAGNOSIS — R339 Retention of urine, unspecified: Secondary | ICD-10-CM | POA: Diagnosis not present

## 2020-10-09 DIAGNOSIS — N3941 Urge incontinence: Secondary | ICD-10-CM | POA: Diagnosis not present

## 2020-10-09 DIAGNOSIS — R310 Gross hematuria: Secondary | ICD-10-CM

## 2020-10-09 DIAGNOSIS — R319 Hematuria, unspecified: Secondary | ICD-10-CM | POA: Diagnosis not present

## 2020-10-09 LAB — BLADDER SCAN AMB NON-IMAGING

## 2020-10-10 ENCOUNTER — Telehealth: Payer: Self-pay | Admitting: Family Medicine

## 2020-10-10 LAB — URINALYSIS, COMPLETE
Bilirubin, UA: NEGATIVE
Glucose, UA: NEGATIVE
Ketones, UA: NEGATIVE
Nitrite, UA: POSITIVE — AB
Protein,UA: NEGATIVE
Specific Gravity, UA: 1.015 (ref 1.005–1.030)
Urobilinogen, Ur: 0.2 mg/dL (ref 0.2–1.0)
pH, UA: 6.5 (ref 5.0–7.5)

## 2020-10-10 LAB — MICROSCOPIC EXAMINATION: WBC, UA: >30 /hpf — AB (ref 0–5)

## 2020-10-10 NOTE — Telephone Encounter (Signed)
Patient husband notified and voiced understanding.

## 2020-10-10 NOTE — Telephone Encounter (Signed)
-----   Message from Nori Riis, PA-C sent at 10/10/2020  9:49 AM EDT ----- Please let her and her husband know that there was no blood in her urine.

## 2020-10-18 ENCOUNTER — Other Ambulatory Visit: Payer: Self-pay

## 2020-10-18 ENCOUNTER — Other Ambulatory Visit: Payer: Self-pay | Admitting: Urology

## 2020-10-18 ENCOUNTER — Ambulatory Visit (HOSPITAL_BASED_OUTPATIENT_CLINIC_OR_DEPARTMENT_OTHER)
Admission: RE | Admit: 2020-10-18 | Discharge: 2020-10-18 | Disposition: A | Discharge status: Home / Self Care | Payer: Medicare PPO | Source: Ambulatory Visit | Attending: Urology | Admitting: Urology

## 2020-10-18 DIAGNOSIS — R310 Gross hematuria: Secondary | ICD-10-CM | POA: Insufficient documentation

## 2020-10-22 ENCOUNTER — Other Ambulatory Visit: Payer: Self-pay | Admitting: Urology

## 2020-10-22 ENCOUNTER — Telehealth: Payer: Self-pay | Admitting: Family Medicine

## 2020-10-22 DIAGNOSIS — N858 Other specified noninflammatory disorders of uterus: Secondary | ICD-10-CM

## 2020-10-22 NOTE — Telephone Encounter (Signed)
-----   Message from Nori Riis, PA-C sent at 10/22/2020 10:40 AM EDT ----- I have left messages for Mr. Hustead to call me back regarding the ultrasound results.  Mrs. Newport has an area in her uterus that needs more follow up.  I would recommend she see a gynecologist at this time.

## 2020-10-22 NOTE — Progress Notes (Signed)
Orders for referral to gynecology are in chart.

## 2020-10-22 NOTE — Telephone Encounter (Signed)
Patient notified and voiced understanding. She will contact her OBGYN.

## 2020-10-23 ENCOUNTER — Encounter: Payer: Medicare Other | Admitting: Obstetrics & Gynecology

## 2020-11-01 ENCOUNTER — Encounter: Payer: Self-pay | Admitting: Obstetrics & Gynecology

## 2020-11-01 ENCOUNTER — Ambulatory Visit: Payer: Medicare PPO | Admitting: Obstetrics & Gynecology

## 2020-11-01 ENCOUNTER — Other Ambulatory Visit: Payer: Self-pay

## 2020-11-01 ENCOUNTER — Other Ambulatory Visit (HOSPITAL_COMMUNITY)
Admission: RE | Admit: 2020-11-01 | Discharge: 2020-11-01 | Disposition: A | Discharge status: Home / Self Care | Payer: Medicare PPO | Source: Ambulatory Visit | Attending: Obstetrics & Gynecology | Admitting: Obstetrics & Gynecology

## 2020-11-01 VITALS — BP 120/80 | Ht 64.5 in | Wt 112.0 lb

## 2020-11-01 DIAGNOSIS — N950 Postmenopausal bleeding: Secondary | ICD-10-CM | POA: Diagnosis not present

## 2020-11-01 DIAGNOSIS — R935 Abnormal findings on diagnostic imaging of other abdominal regions, including retroperitoneum: Secondary | ICD-10-CM | POA: Diagnosis not present

## 2020-11-01 NOTE — Progress Notes (Signed)
Postmenopausal Bleeding Patient is a 74 yo WF who complains of one episode of vaginal bleeding a month ago.  She also has continued urinary incontinence refractory to medical therapy per her urologist.  Myrbetriq (interacts w Tamoxifen), Gemfesa, Flomax are meds she has been on recently.  She has had a renal US done, normal for kidneys, but incidental endometrial finding.  She denies vag pressure or pain.  No BM concerning sx's.  [place]- 2.3 x 2.3 x 3.1 cm complex masslike region with cystic components within the fundal endometrium.  She has been menopausal for several years. Currently on no HRT and no blood thinner medicine. Bleeding is described as scant staining and has occurred 1 times. Other menopausal symptoms include: none. Workup to date: none.  Menstrual History: OB History    Gravida  1   Para  1   Term  1   Preterm      AB      Living  1     SAB      TAB      Ectopic      Multiple      Live Births              PMHx: She  has a past medical history of Anemia, Ataxia, Bipolar affective disorder ([place]), Breast cancer of upper-outer quadrant of left female breast ([place]) (05/09/2019), Broken foot (2015), Chronic gastritis, Chronic kidney disease, COPD (chronic obstructive pulmonary disease) ([place]), Dizziness, Dysphagia, Falls frequently, Glaucoma, Hyperlipemia, Hypertension, Hyperthyroidism, Lithium toxicity (2015), Malignant neoplasm of upper-outer quadrant of female breast ([place]) (05/07/2007), Memory difficulty, Neck stiffness, Osteoarthritis, Pancreatitis, Parkinson disease ([place]), Pituitary microadenoma ([place]), Recurrent UTI, Renal insufficiency, and Stomach ulcer (2112). Also,  has a past surgical history that includes Colonoscopy (2004); Neck surgery (2006); Eye surgery (2013); Appendectomy; Tubal ligation; Colonoscopy (N/A, 05/14/2015); Esophagogastroduodenoscopy (N/A, 05/14/2015); Savory dilation (N/A, 05/14/2015); ERCP (N/A); Glaucoma surgery (Right, 2013); cataract (Right,  2013); Breast surgery (Left,  2013); Breast surgery (Right, November 06, 1999); Breast surgery (Left, May 07, 2007); EUS (N/A, 2011, 2012); Laparoscopic right colectomy (Right, 06/22/2015); right breast cancer; Trabeculectomy (Left, 03/05/2016); Cataract extraction w/PHACO (Left, 03/05/2016); Breast biopsy (Right, 2011); Breast excisional biopsy (Right, 2001); Breast excisional biopsy (Right, 2008); Breast excisional biopsy (Left, 10/26/2012); Breast biopsy (Left, 02/09/2019); Colon surgery; and Simple mastectomy with axillary sentinel node biopsy (Left, 05/09/2019)., family history includes Breast cancer (age of onset: 5) in her mother.,  reports that she quit smoking about 12 years ago. Her smoking use included cigarettes. She started smoking about 55 years ago. She has a 43.00 pack-year smoking history. She has never used smokeless tobacco. She reports current alcohol use. She reports that she does not use drugs.  She has a current medication list which includes the following prescription(s): albuterol, alprazolam, azopt, benzonatate, bisacodyl, brimonidine tartrate-timolol, bupropion, dextromethorphan, escitalopram, esomeprazole, breo ellipta, ipratropium, lumigan, metoprolol succinate, nicotine polacrilex, ondansetron, rosuvastatin, tamoxifen, tamsulosin, spiriva respimat, spiriva respimat, and [DISCONTINUED] mirabegron er. Also, is allergic to influenza vaccines, flu virus vaccine, gabapentin, sulfa antibiotics, and tetracyclines & related.  Review of Systems  Constitutional: Positive for malaise/fatigue. Negative for chills and fever.  HENT: Positive for hearing loss. Negative for congestion, sinus pain and sore throat.   Eyes: Negative for blurred vision and pain.  Respiratory: Negative for cough and wheezing.   Cardiovascular: Negative for chest pain and leg swelling.  Gastrointestinal: Positive for constipation and diarrhea. Negative for abdominal pain, heartburn, nausea and vomiting.  Genitourinary:  Positive for frequency. Negative for dysuria,  hematuria and urgency.  Musculoskeletal: Negative for back pain, joint pain, myalgias and neck pain.  Skin: Positive for itching. Negative for rash.  Neurological: Positive for tingling and weakness. Negative for dizziness and tremors.  Endo/Heme/Allergies: Bruises/bleeds easily.  Psychiatric/Behavioral: Positive for depression. The patient is nervous/anxious. The patient does not have insomnia.     Objective: BP 120/80   Ht 5' 4.5" (1.638 m)   Wt 112 lb (50.8 kg)   BMI 18.93 kg/m  Physical Exam Constitutional:      General: She is not in acute distress.    Appearance: She is well-developed.  Genitourinary:     Pelvic exam was performed with patient supine.     Vagina and uterus normal.     No vaginal erythema or bleeding.     No cervical motion tenderness, discharge, polyp or nabothian cyst.     Uterus is mobile.     Uterus is not enlarged.     No uterine mass detected.    Uterus is midaxial.     No right or left adnexal mass present.     Right adnexa not tender.     Left adnexa not tender.     Genitourinary Comments: Gr 1 POP, small uterus G1 cystocele No rectocele  HENT:     Head: Normocephalic and atraumatic.     Nose: Nose normal.  Abdominal:     General: There is no distension.     Palpations: Abdomen is soft.     Tenderness: There is no abdominal tenderness.  Musculoskeletal:        General: Normal range of motion.  Neurological:     Mental Status: She is alert and oriented to person, place, and time.     Cranial Nerves: No cranial nerve deficit.  Skin:    General: Skin is warm and dry.  Psychiatric:        Attention and Perception: Attention normal.        Mood and Affect: Mood and affect normal.        Speech: Speech normal.        Behavior: Behavior normal.        Thought Content: Thought content normal.        Judgment: Judgment normal.     ASSESSMENT/PLAN:   Problem List Items Addressed This Visit     Abnormal endometrial ultrasound    -  Primary   Relevant Orders   US PELVIC COMPLETE WITH TRANSVAGINAL   Post-menopausal bleeding       Relevant Orders   US PELVIC COMPLETE WITH TRANSVAGINAL   Surgical pathology     Endometrial Biopsy After discussion with the patient regarding her abnormal uterine bleeding I recommended that she proceed with an endometrial biopsy for further diagnosis. The risks, benefits, alternatives, and indications for an endometrial biopsy were discussed with the patient in detail. She understood the risks including infection, bleeding, cervical laceration and uterine perforation.  Verbal consent was obtained.   PROCEDURE NOTE:  [place] endometrial biopsy was performed using aseptic technique with iodine preparation.  The uterus was sounded to a length of 6 cm.  Adequate sampling was obtained with minimal blood loss.  The patient tolerated the procedure well.  Disposition will be pending pathology.   Assess w EMB and [place]    If no cancer, then conservative mgt advised    Not a significant enough prolapse to consider pessary or surgery as a help for incontinence.  Mixed incont, but more so detruser instability  over stress incontinence.  Cont trial of meds per urology, or alternative OAB therapies.     If cancer, Gyn Onc referral and surgery planning.  Barnett Applebaum, MD, [facility], [facility] 11/01/2020  3:36 PM

## 2020-11-01 NOTE — Patient Instructions (Signed)

## 2020-11-05 ENCOUNTER — Telehealth: Payer: Self-pay | Admitting: Obstetrics & Gynecology

## 2020-11-05 LAB — SURGICAL PATHOLOGY

## 2020-11-05 NOTE — Telephone Encounter (Signed)
Her results are negative correct?

## 2020-11-05 NOTE — Telephone Encounter (Signed)
Patient calling requesting pap results.

## 2020-11-06 ENCOUNTER — Other Ambulatory Visit: Payer: Self-pay

## 2020-11-06 ENCOUNTER — Ambulatory Visit (HOSPITAL_BASED_OUTPATIENT_CLINIC_OR_DEPARTMENT_OTHER)
Admission: RE | Admit: 2020-11-06 | Discharge: 2020-11-06 | Disposition: A | Discharge status: Home / Self Care | Payer: Medicare PPO | Source: Ambulatory Visit | Attending: Obstetrics & Gynecology | Admitting: Obstetrics & Gynecology

## 2020-11-06 DIAGNOSIS — N950 Postmenopausal bleeding: Secondary | ICD-10-CM

## 2020-11-06 DIAGNOSIS — R935 Abnormal findings on diagnostic imaging of other abdominal regions, including retroperitoneum: Secondary | ICD-10-CM | POA: Diagnosis present

## 2020-11-08 ENCOUNTER — Telehealth: Payer: Self-pay | Admitting: Urology

## 2020-11-08 DIAGNOSIS — N3941 Urge incontinence: Secondary | ICD-10-CM

## 2020-11-08 DIAGNOSIS — N39498 Other specified urinary incontinence: Secondary | ICD-10-CM

## 2020-11-08 DIAGNOSIS — R339 Retention of urine, unspecified: Secondary | ICD-10-CM

## 2020-11-08 NOTE — Telephone Encounter (Signed)
Please have Elaine Clayton follow up in April so that we can keep an eye on her residuals.

## 2020-11-08 NOTE — Telephone Encounter (Signed)
We contacted the patient to schedule an appointment so that we could continue to monitor her PVRs as they tend to remain elevated.  They stated they coming in for an appointment would be difficult and that they just wanted a refill in for the Myrbetriq.  I explained that due to her elevated PVRs, Myrbetriq would not be a safe option for her.  The Myrbetriq could potentially place her in urinary retention causing the need for catheter and/or acute renal injury due to the retention.  They then expressed how frustrated they were due to the fact that she is leaking urine continually.  I then offered to instruct them on self-catheterization to keep the residuals low to see if this would help with the incontinence or even place a suprapubic tube to help empty her bladder.  They stated that both these options were not feasible.  I explained that I was not comfortable prescribing Myrbetriq at this time and would offer them an appointment with Dr. Matilde Sprang to discuss her incontinence further.  They were agreeable, but they would like to see him at [facility] as they live in [place].

## 2020-11-08 NOTE — Telephone Encounter (Signed)
Pt husband called asking to speak to [facility], I saw this note in pt chart and shared with husband who then said that's not going to work, the pt needs to be put on meds for ongoing problems.  Pt refused an appt said he wanted to talk to shannon. Please advise

## 2021-02-01 ENCOUNTER — Telehealth: Payer: Self-pay

## 2021-02-01 NOTE — Telephone Encounter (Signed)
Called pt to remind her to schedule her mammogram. A man answered and stated that Elaine Clayton was on speaker and could her me. I stated that we needed her to schedule her mammogram. The man laughed and said she will not do that. Sayre said thank you, and the man repeated she's not going to do it.

## 2021-02-01 NOTE — Telephone Encounter (Signed)
-----   Message from Gae Dry, MD sent at 02/01/2021  2:26 PM EST ----- Regarding: MMG Pt still has not had MMG done.  Please call and encourage her to do so, and even offer to help schedule the MMG if she feels this would help her.  The number is [phone] to schedule at [facility].  Do not let her hang up without a plan to have this done.  It is very important to Dr Kenton Kingfisher to have this MMG done.  Thanks for the help.

## 2021-07-09 NOTE — Progress Notes (Signed)
 Elaine Clayton is a  75 y.o. female who presents for  CHIEF COMPLAINT No chief complaint on file.   Subjective: History of Present Illness  Pt in NAd. Weight and BP stable. Eating and sleeping well. Some hallucinations. Labs stable. Defers BD and MMG. No fever. Denies CP or SOB. No palpitations. No change in bowels or bladder   Past Medical History:  Diagnosis Date  . Abnormal cytology   . Allergic state    [email or web address]  . Anemia, unspecified   . Anxiety 1995  . Apraxia   . Bipolar disorder (CMS-HCC)   . Breast cancer (CMS-HCC) 2008   Right. S/P lumpectomy, adjuvant chemotherapy  . Breast cancer (CMS-HCC) 05/09/2019   Left. 17 mm invasive lobular carcinoma, ER positive, PR negative, HER-2 negative.  S/P mastectomy with sentinel node biopsy.  . Cataract cortical, senile   . Chronic airway obstruction, not elsewhere classified , unspecified (CMS-HCC)    tobacco abuse  . Chronic kidney disease   . Chronic pancreatitis (CMS-HCC)   . DDD (degenerative disc disease)    with chronic back pain.  . Depression   . Dysphasia   . Emphysema of lung (CMS-HCC) 2004  . Essential hypertension, benign   . Fibrocystic breast disease   . Gastric ulcer   . Gastritis 11/07/1994   EGD  . GERD (gastroesophageal reflux disease)   . Glaucoma   . H/O hyperthyroidism   . High lithium  level 2015   toxic  . Osteoarthritis    with hip pain.  . Other and unspecified hyperlipidemia   . Parkinson disease (CMS-HCC)   . Pituitary microadenoma (CMS-HCC)    Questionable. MRI Brain in 2017 showed no microadenoma.  . Renal insufficiency    Patient Active Problem List  Diagnosis  . Anemia, unspecified  . COPD (chronic obstructive pulmonary disease) (CMS-HCC)  . Hyperlipidemia  . HTN (hypertension)  . Renal insufficiency  . Bipolar disorder (CMS-HCC)  . Disc disease, degenerative, cervical  . OA (osteoarthritis)  . Breast cancer (CMS-HCC)  . Chronic pancreatitis (CMS-HCC)  . Ataxic gait  .  Cognitive decline  . PBA (pseudobulbar affect)  . Cognitive changes  . Truncal ataxia  . Gait instability  . Atypical parkinsonism (CMS-HCC)  . CKD (chronic kidney disease) stage 3, GFR 30-59 ml/min (CMS-HCC)  . Senile osteoporosis    Past Surgical History:  Procedure Laterality Date  . APPENDECTOMY  1955  . BREAST EXCISIONAL BIOPSY Bilateral   . CATARACT EXTRACTION  2014.2016  . COLON SURGERY  2016  . COLONOSCOPY  11/07/1994   No Evidence of Colitis  . COLONOSCOPY  04/25/2003   Hyperplastic Polyps  . COLONOSCOPY  05/14/2015   20mm Polyp @ the [facility] w/Referral to Dr. Lovenia: [facility]BF 04/2018; Recall Ltr mailed 03/29/2018 (dh)  . EGD  01/20/2011   Gastric Ulcer  . EGD  04/14/2011   Gastric Scar Biopsy  . EGD  05/14/2015   No repeat per RTE  . MASTECTOMY BILATERAL SIMPLE Left 05/09/2019  . MASTECTOMY PARTIAL / LUMPECTOMY Right 05/07/2007  . OTHER SURGERY Left    MASTECTOMY  . SPINE SURGERY    . Status post appendectomy in remote past.    . Status post cyst from right breast in remote past.    . Status post resection of microcalcification of breast, 2000.    SABRA Three ERCPs as well as two EUSs    . TRABECULECTOMY Left 2017  . TUBAL LIGATION  1981     Current Outpatient Medications:  .  aclidinium bromide  400 mcg/actuation AePB, Inhale into the lungs once daily  , Disp: , Rfl:  .  albuterol  (PROAIR  HFA) 90 mcg/actuation inhaler, Inhale 2 inhalations into the lungs every 4 (four) hours as needed for Wheezing, Disp: 1 each, Rfl: 11 .  ALPRAZolam  (XANAX ) 0.5 MG tablet, Take 1 tablet (0.5 mg total) by mouth 2 (two) times daily as needed for Sleep, Disp: 60 tablet, Rfl: 5 .  benzonatate  (TESSALON ) 100 MG capsule, Take 1 capsule (100 mg total) by mouth every 6 (six) hours as needed, Disp: 20 capsule, Rfl: 11 .  bisacodyl (DULCOLAX) 5 mg EC tablet, Take 5 mg by mouth once daily as needed  , Disp: , Rfl:  .  brimonidine -timoloL  (COMBIGAN ) 0.2-0.5 % ophthalmic solution, Place 1  drop into both eyes 2 (two) times daily, Disp: 5 mL, Rfl: 5 .  brinzolamide  (AZOPT ) 1 % ophthalmic suspension, 1 drop in the right eye 2 times a day, Disp: , Rfl:  .  buPROPion  (WELLBUTRIN  SR) 150 MG SR tablet, Take 1 tablet (150 mg total) by mouth 2 (two) times daily, Disp: 180 each, Rfl: 3 .  clobetasol (TEMOVATE) 0.05 % ointment, Apply 1 Application topically as needed   , Disp: , Rfl:  .  dextromethorphan  HBr 5 mg/5 mL Syrp, Take by mouth, Disp: , Rfl:  .  diphenhydramine-acetaminophen  (TYLENOL  PM) 25-500 mg per tablet, Take 2 tablets by mouth nightly as needed  , Disp: , Rfl:  .  ipratropium (ATROVENT ) 0.06 % nasal spray, Place 2 sprays into both nostrils 3 (three) times daily, Disp: 15 mL, Rfl: 5 .  LUMIGAN  0.01 % ophthalmic solution, Place 1 drop into both eyes 2 (two) times daily, Disp: 2.5 mL, Rfl: 5 .  metoprolol succinate (TOPROL-XL) 50 MG XL tablet, Take 0.5 tablets (25 mg total) by mouth once daily, Disp: 30 tablet, Rfl: 1 .  nicotine  polacrilex (NICORETTE ) 2 mg gum, Take 2 mg by mouth as needed for Smoking cessation Chew the gum until it tingles and then park it between your cheek and gum. Repeat., Disp: , Rfl:  .  ondansetron  (ZOFRAN ) 4 MG tablet, Take 1 tablet (4 mg total) by mouth every 8 (eight) hours as needed for Nausea, Disp: 30 tablet, Rfl: 5 .  oxybutynin (DITROPAN-XL) 10 MG XL tablet, Take by mouth once daily, Disp: , Rfl:  .  rosuvastatin  (CRESTOR ) 10 MG tablet, Take 1 tablet (10 mg total) by mouth once daily, Disp: 90 tablet, Rfl: 3 .  SPIRIVA  RESPIMAT 2.5 mcg/actuation inhalation spray, Inhale 2 inhalations (5 mcg total) into the lungs once daily, Disp: 4 g, Rfl: 11 .  tamsulosin (FLOMAX) 0.4 mg capsule, Take 1 capsule (0.4 mg total) by mouth every other day, Disp: 30 capsule, Rfl: 5  Sulfa (sulfonamide antibiotics), Ciprofloxacin  (bulk), Gabapentin , Influenza virus vaccine qv 7984-83(81 thru 64 yrs), and Tetracycline  Social History   Socioeconomic History  . Marital  status: Married  Tobacco Use  . Smoking status: Former Smoker    Packs/day: 1.00    Years: 43.00    Pack years: 43.00    Quit date: 12/23/2007    Years since quitting: 13.5  . Smokeless tobacco: Never Used  Substance and Sexual Activity  . Alcohol use: No  . Drug use: No  . Sexual activity: Yes    Partners: Male    Birth control/protection: Patch    Family History  Problem Relation Age of Onset  . COPD Mother   . Stroke Mother   .  Cancer Mother   . Hyperlipidemia (Elevated cholesterol) Mother   . High blood pressure (Hypertension) Mother   . Breast cancer Mother   . Hip fracture Mother   . Osteoarthritis Mother   . Osteoporosis (Thinning of bones) Mother   . Thyroid disease Mother   . Myocardial Infarction (Heart attack) Father   . Coronary Artery Disease (Blocked arteries around heart) Father     A comprehensive ROS was negative   PE: BP 126/80   Pulse 56   Ht 162.6 cm (5' 4)   Wt 48.5 kg (107 lb)   SpO2 99%   BMI 18.37 kg/m  The patient looks well today. She is in no distress. Neck is supple without adenopathy.  Carotids are 2+ bilaterally without bruits. Lungs are clear. Heart is regular rate and rhythm without murmurs, rubs, or gallops. Abdomen is soft, non tender, no masses or organomegaly. Lower extremities without obvious edema.   Appointment on 07/02/2021  Component Date Value Ref Range Status  . Glucose 07/02/2021 89  70 - 110 mg/dL Final  . Sodium 92/87/7977 139  136 - 145 mmol/L Final  . Potassium 07/02/2021 4.2  3.6 - 5.1 mmol/L Final  . Chloride 07/02/2021 107  97 - 109 mmol/L Final  . Carbon Dioxide (CO2) 07/02/2021 26.4  22.0 - 32.0 mmol/L Final  . Urea Nitrogen (BUN) 07/02/2021 17  7 - 25 mg/dL Final  . Creatinine 92/87/7977 1.4 (!) 0.6 - 1.1 mg/dL Final  . Glomerular Filtration Rate (eGFR),* 07/02/2021 37 (!) >60 mL/min/1.73sq m Final  . Calcium  07/02/2021 8.8  8.7 - 10.3 mg/dL Final  . AST  92/87/7977 14  8 - 39 U/L Final  . ALT  07/02/2021 7   5 - 38 U/L Final  . Alk Phos (alkaline Phosphatase) 07/02/2021 57  34 - 104 U/L Final  . Albumin 07/02/2021 3.9  3.5 - 4.8 g/dL Final  . Bilirubin, Total 07/02/2021 0.4  0.3 - 1.2 mg/dL Final  . Protein, Total 07/02/2021 6.4  6.1 - 7.9 g/dL Final  . A/G Ratio 92/87/7977 1.6  1.0 - 5.0 gm/dL Final  . WBC (White Blood Cell Count) 07/02/2021 6.0  4.1 - 10.2 10^3/uL Final  . RBC (Red Blood Cell Count) 07/02/2021 3.82 (!) 4.04 - 5.48 10^6/uL Final  . Hemoglobin 07/02/2021 11.7 (!) 12.0 - 15.0 gm/dL Final  . Hematocrit 92/87/7977 36.3  35.0 - 47.0 % Final  . MCV (Mean Corpuscular Volume) 07/02/2021 95.0  80.0 - 100.0 fl Final  . MCH (Mean Corpuscular Hemoglobin) 07/02/2021 30.6  27.0 - 31.2 pg Final  . MCHC (Mean Corpuscular Hemoglobin * 07/02/2021 32.2  32.0 - 36.0 gm/dL Final  . Platelet Count 07/02/2021 205  150 - 450 10^3/uL Final  . RDW-CV (Red Cell Distribution Widt* 07/02/2021 13.7  11.6 - 14.8 % Final  . MPV (Mean Platelet Volume) 07/02/2021 9.7  9.4 - 12.4 fl Final  . Neutrophils 07/02/2021 2.91  1.50 - 7.80 10^3/uL Final  . Lymphocytes 07/02/2021 2.47  1.00 - 3.60 10^3/uL Final  . Monocytes 07/02/2021 0.46  0.00 - 1.50 10^3/uL Final  . Eosinophils 07/02/2021 0.10  0.00 - 0.55 10^3/uL Final  . Basophils 07/02/2021 0.04  0.00 - 0.09 10^3/uL Final  . Neutrophil % 07/02/2021 48.5  32.0 - 70.0 % Final  . Lymphocyte % 07/02/2021 41.2  10.0 - 50.0 % Final  . Monocyte % 07/02/2021 7.7  4.0 - 13.0 % Final  . Eosinophil % 07/02/2021 1.7  1.0 - 5.0 %  Final  . Basophil% 07/02/2021 0.7  0.0 - 2.0 % Final  . Immature Granulocyte % 07/02/2021 0.2  <=0.7 % Final  . Immature Granulocyte Count 07/02/2021 0.01  <=0.06 10^3/L Final  . Cholesterol, Total 07/02/2021 199  100 - 200 mg/dL Final  . Triglyceride 92/87/7977 132  35 - 199 mg/dL Final  . HDL (High Density Lipoprotein) Cho* 07/02/2021 105.5 (!) 35.0 - 85.0 mg/dL Final  . LDL Calculated 07/02/2021 67  0 - 130 mg/dL Final  . VLDL Cholesterol  07/02/2021 26  mg/dL Final  . Cholesterol/HDL Ratio 07/02/2021 1.9   Final  . Color 07/02/2021 Yellow  Yellow, Violet, Light Violet, Dark Violet Final  . Clarity 07/02/2021 SL Cloudy (!) Clear, Other Final  . Specific Gravity 07/02/2021 1.010  1.000 - 1.030 Final  . pH, Urine 07/02/2021 6.0  5.0 - 8.0 Final  . Protein, Urinalysis 07/02/2021 Negative  Negative, Trace mg/dL Final  . Glucose, Urinalysis 07/02/2021 Negative  Negative mg/dL Final  . Ketones, Urinalysis 07/02/2021 Negative  Negative mg/dL Final  . Blood, Urinalysis 07/02/2021 Negative  Negative Final  . Nitrite, Urinalysis 07/02/2021 Negative  Negative Final  . Leukocyte Esterase, Urinalysis 07/02/2021 Trace (!) Negative Final  . White Blood Cells, Urinalysis 07/02/2021 4-10 (!) None Seen, 0-3 /hpf Final  . Red Blood Cells, Urinalysis 07/02/2021 0-3  None Seen, 0-3 /hpf Final  . Bacteria, Urinalysis 07/02/2021 Few (!) None Seen /hpf Final  . Squamous Epithelial Cells, Urinaly* 07/02/2021 Rare  Rare, Few, None Seen /hpf Final  . Thyroid Stimulating Hormone (TSH) 07/02/2021 1.430  0.450-5.330 uIU/ml uIU/mL Final  . Hemoglobin A1C 07/02/2021 5.9 (!) 4.2 - 5.6 % Final  . Average Blood Glucose (Calc) 07/02/2021 123  mg/dL Final  Appointment on 95/94/7977  Component Date Value Ref Range Status  . Vitamin D , 25-Hydroxy - [facility] 03/26/2021 35.3  30.0 - 100.0 ng/mL Final  Office Visit on 12/11/2020  Component Date Value Ref Range Status  . Glucose 12/11/2020 74  70 - 110 mg/dL Final  . Sodium 87/78/7978 138  136 - 145 mmol/L Final  . Potassium 12/11/2020 4.8  3.6 - 5.1 mmol/L Final  . Chloride 12/11/2020 104  97 - 109 mmol/L Final  . Carbon Dioxide (CO2) 12/11/2020 25.9  22.0 - 32.0 mmol/L Final  . Urea Nitrogen (BUN) 12/11/2020 19  7 - 25 mg/dL Final  . Creatinine 87/78/7978 1.4 (!) 0.6 - 1.1 mg/dL Final  . Glomerular Filtration Rate (eGFR),* 12/11/2020 37 (!) >60 mL/min/1.73sq m Final  . Calcium  12/11/2020 8.7  8.7 - 10.3 mg/dL  Final  . AST  87/78/7978 13  8 - 39 U/L Final  . ALT  12/11/2020 8  5 - 38 U/L Final  . Alk Phos (alkaline Phosphatase) 12/11/2020 43  34 - 104 U/L Final  . Albumin 12/11/2020 4.0  3.5 - 4.8 g/dL Final  . Bilirubin, Total 12/11/2020 0.4  0.3 - 1.2 mg/dL Final  . Protein, Total 12/11/2020 6.3  6.1 - 7.9 g/dL Final  . A/G Ratio 87/78/7978 1.7  1.0 - 5.0 gm/dL Final  . WBC (White Blood Cell Count) 12/11/2020 6.2  4.1 - 10.2 10^3/uL Final  . RBC (Red Blood Cell Count) 12/11/2020 3.71 (!) 4.04 - 5.48 10^6/uL Final  . Hemoglobin 12/11/2020 9.6 (!) 12.0 - 15.0 gm/dL Final  . Hematocrit 87/78/7978 32.0 (!) 35.0 - 47.0 % Final  . MCV (Mean Corpuscular Volume) 12/11/2020 86.3  80.0 - 100.0 fl Final  . MCH (Mean Corpuscular Hemoglobin) 12/11/2020 25.9 ROLLEN)  27.0 - 31.2 pg Final  . MCHC (Mean Corpuscular Hemoglobin * 12/11/2020 30.0 (!) 32.0 - 36.0 gm/dL Final  . Platelet Count 12/11/2020 226  150 - 450 10^3/uL Final  . RDW-CV (Red Cell Distribution Widt* 12/11/2020 15.4 (!) 11.6 - 14.8 % Final  . MPV (Mean Platelet Volume) 12/11/2020 9.4  9.4 - 12.4 fl Final  . Neutrophils 12/11/2020 3.52  1.50 - 7.80 10^3/uL Final  . Lymphocytes 12/11/2020 1.95  1.00 - 3.60 10^3/uL Final  . Monocytes 12/11/2020 0.55  0.00 - 1.50 10^3/uL Final  . Eosinophils 12/11/2020 0.13  0.00 - 0.55 10^3/uL Final  . Basophils 12/11/2020 0.05  0.00 - 0.09 10^3/uL Final  . Neutrophil % 12/11/2020 56.6  32.0 - 70.0 % Final  . Lymphocyte % 12/11/2020 31.4  10.0 - 50.0 % Final  . Monocyte % 12/11/2020 8.9  4.0 - 13.0 % Final  . Eosinophil % 12/11/2020 2.1  1.0 - 5.0 % Final  . Basophil% 12/11/2020 0.8  0.0 - 2.0 % Final  . Immature Granulocyte % 12/11/2020 0.2  <=0.7 % Final  . Immature Granulocyte Count 12/11/2020 0.01  <=0.06 10^3/L Final  . Hemoglobin A1C 12/11/2020 5.8 (!) 4.2 - 5.6 % Final  . Average Blood Glucose (Calc) 12/11/2020 120  mg/dL Final  . Thyroid Stimulating Hormone (TSH) 12/11/2020 1.504  0.450-5.330 uIU/ml  uIU/mL Final  Appointment on 10/12/2020  Component Date Value Ref Range Status  . WBC (White Blood Cell Count) 10/12/2020 6.0  4.1 - 10.2 10^3/uL Final  . RBC (Red Blood Cell Count) 10/12/2020 3.63 (!) 4.04 - 5.48 10^6/uL Final  . Hemoglobin 10/12/2020 9.5 (!) 12.0 - 15.0 gm/dL Final  . Hematocrit 89/77/7978 31.4 (!) 35.0 - 47.0 % Final  . MCV (Mean Corpuscular Volume) 10/12/2020 86.5  80.0 - 100.0 fl Final  . MCH (Mean Corpuscular Hemoglobin) 10/12/2020 26.2 (!) 27.0 - 31.2 pg Final  . MCHC (Mean Corpuscular Hemoglobin * 10/12/2020 30.3 (!) 32.0 - 36.0 gm/dL Final  . Platelet Count 10/12/2020 241  150 - 450 10^3/uL Final  . RDW-CV (Red Cell Distribution Widt* 10/12/2020 15.6 (!) 11.6 - 14.8 % Final  . MPV (Mean Platelet Volume) 10/12/2020 9.4  9.4 - 12.4 fl Final  . Neutrophils 10/12/2020 3.38  1.50 - 7.80 10^3/uL Final  . Lymphocytes 10/12/2020 1.89  1.00 - 3.60 10^3/uL Final  . Monocytes 10/12/2020 0.45  0.00 - 1.50 10^3/uL Final  . Eosinophils 10/12/2020 0.16  0.00 - 0.55 10^3/uL Final  . Basophils 10/12/2020 0.06  0.00 - 0.09 10^3/uL Final  . Neutrophil % 10/12/2020 56.7  32.0 - 70.0 % Final  . Lymphocyte % 10/12/2020 31.8  10.0 - 50.0 % Final  . Monocyte % 10/12/2020 7.6  4.0 - 13.0 % Final  . Eosinophil % 10/12/2020 2.7  1.0 - 5.0 % Final  . Basophil% 10/12/2020 1.0  0.0 - 2.0 % Final  . Immature Granulocyte % 10/12/2020 0.2  <=0.7 % Final  . Immature Granulocyte Count 10/12/2020 0.01  <=0.06 10^3/L Final  Office Visit on 09/12/2020  Component Date Value Ref Range Status  . Glucose 09/12/2020 80  70 - 110 mg/dL Final  . Sodium 90/77/7978 139  136 - 145 mmol/L Final  . Potassium 09/12/2020 4.3  3.6 - 5.1 mmol/L Final  . Chloride 09/12/2020 106  97 - 109 mmol/L Final  . Carbon Dioxide (CO2) 09/12/2020 25.4  22.0 - 32.0 mmol/L Final  . Urea Nitrogen (BUN) 09/12/2020 16  7 - 25 mg/dL Final  .  Creatinine 09/12/2020 1.3 (!) 0.6 - 1.1 mg/dL Final  . Glomerular Filtration Rate  (eGFR),* 09/12/2020 40 (!) >60 mL/min/1.73sq m Final  . Calcium  09/12/2020 8.9  8.7 - 10.3 mg/dL Final  . AST  90/77/7978 11  8 - 39 U/L Final  . ALT  09/12/2020 6  5 - 38 U/L Final  . Alk Phos (alkaline Phosphatase) 09/12/2020 48  34 - 104 U/L Final  . Albumin 09/12/2020 3.9  3.5 - 4.8 g/dL Final  . Bilirubin, Total 09/12/2020 0.4  0.3 - 1.2 mg/dL Final  . Protein, Total 09/12/2020 6.4  6.1 - 7.9 g/dL Final  . A/G Ratio 90/77/7978 1.6  1.0 - 5.0 gm/dL Final  . WBC (White Blood Cell Count) 09/12/2020 6.1  4.1 - 10.2 10^3/uL Final  . RBC (Red Blood Cell Count) 09/12/2020 3.58 (!) 4.04 - 5.48 10^6/uL Final  . Hemoglobin 09/12/2020 9.5 (!) 12.0 - 15.0 gm/dL Final  . Hematocrit 90/77/7978 31.1 (!) 35.0 - 47.0 % Final  . MCV (Mean Corpuscular Volume) 09/12/2020 86.9  80.0 - 100.0 fl Final  . MCH (Mean Corpuscular Hemoglobin) 09/12/2020 26.5 (!) 27.0 - 31.2 pg Final  . MCHC (Mean Corpuscular Hemoglobin * 09/12/2020 30.5 (!) 32.0 - 36.0 gm/dL Final  . Platelet Count 09/12/2020 228  150 - 450 10^3/uL Final  . RDW-CV (Red Cell Distribution Widt* 09/12/2020 15.6 (!) 11.6 - 14.8 % Final  . MPV (Mean Platelet Volume) 09/12/2020 9.3 (!) 9.4 - 12.4 fl Final  . Neutrophils 09/12/2020 3.61  1.50 - 7.80 10^3/uL Final  . Lymphocytes 09/12/2020 1.80  1.00 - 3.60 10^3/uL Final  . Monocytes 09/12/2020 0.53  0.00 - 1.50 10^3/uL Final  . Eosinophils 09/12/2020 0.13  0.00 - 0.55 10^3/uL Final  . Basophils 09/12/2020 0.05  0.00 - 0.09 10^3/uL Final  . Neutrophil % 09/12/2020 58.9  32.0 - 70.0 % Final  . Lymphocyte % 09/12/2020 29.4  10.0 - 50.0 % Final  . Monocyte % 09/12/2020 8.6  4.0 - 13.0 % Final  . Eosinophil % 09/12/2020 2.1  1.0 - 5.0 % Final  . Basophil% 09/12/2020 0.8  0.0 - 2.0 % Final  . Immature Granulocyte % 09/12/2020 0.2  <=0.7 % Final  . Immature Granulocyte Count 09/12/2020 0.01  <=0.06 10^3/L Final   DIAGNOSIS: Primary hypertension  (primary encounter diagnosis)  Panlobular emphysema  (CMS-HCC)  Pure hypercholesterolemia  Atypical parkinsonism (CMS-HCC)  Bipolar affective disorder, current episode manic, current episode severity unspecified (CMS-HCC)  Stage 3b chronic kidney disease (CMS-HCC)  Truncal ataxia   PLAN: No change in care. RTC 4 mo, sooner if needed    I personally performed the service. (TP)  Reyes JONETTA Costa, MD, MD

## 2023-07-06 NOTE — Progress Notes (Signed)
 Elaine Clayton is a  77 y.o. female who presents for  CHIEF COMPLAINT Chief Complaint  Patient presents with  . Follow-up  . Hypertension  . Hyperlipidemia  . prediabetes  . Chronic Kidney Disease    Subjective: History of Present Illness  Pt in NAD. HTN stable on meds. Has HLD on statin, prediabetes not on meds and CKD. Followed by Neurology for Parkinsonism. Remains ataxic. Eating but not gaining weight. Having good days and bad days. No fever or HA's. Denies Cp or SOB. No palpitations. No change in bowels or bladder.    Past Medical History:  Diagnosis Date  . Abnormal cytology   . Allergic state    [email or web address]  . Anemia, unspecified   . Anxiety 1995  . Apraxia   . Bipolar disorder (CMS/HHS-HCC)   . Breast cancer (CMS/HHS-HCC) 2008   Right. S/P lumpectomy, adjuvant chemotherapy  . Breast cancer (CMS/HHS-HCC) 05/09/2019   Left. 17 mm invasive lobular carcinoma, ER positive, PR negative, HER-2 negative.  S/P mastectomy with sentinel node biopsy.  . Cataract cortical, senile   . Chronic airway obstruction, not elsewhere classified    tobacco abuse  . Chronic kidney disease   . Chronic pancreatitis (CMS/HHS-HCC)   . DDD (degenerative disc disease)    with chronic back pain.  . Depression   . Dysphasia   . Emphysema of lung (CMS/HHS-HCC) 2004  . Essential hypertension, benign   . Fibrocystic breast disease   . Gastric ulcer   . Gastritis 11/07/1994   EGD  . GERD (gastroesophageal reflux disease)   . Glaucoma   . H/O hyperthyroidism   . High lithium  level 2015   toxic  . Osteoarthritis    with hip pain.  . Other and unspecified hyperlipidemia   . Parkinson disease   . Pituitary microadenoma (CMS/HHS-HCC)    Questionable. MRI Brain in 2017 showed no microadenoma.  . Renal insufficiency    Patient Active Problem List  Diagnosis  . Anemia, unspecified  . COPD (chronic obstructive pulmonary disease) (CMS/HHS-HCC)  . Hyperlipidemia  . HTN (hypertension)  . Renal  insufficiency  . Bipolar disorder (CMS/HHS-HCC)  . Disc disease, degenerative, cervical  . OA (osteoarthritis)  . Chronic pancreatitis (CMS/HHS-HCC)  . Ataxic gait  . Cognitive decline  . PBA (pseudobulbar affect)  . Cognitive changes  . Truncal ataxia  . Gait instability  . Atypical parkinsonism  . CKD (chronic kidney disease) stage 3, GFR 30-59 ml/min (CMS/HHS-HCC)  . Senile osteoporosis  . Prediabetes    Past Surgical History:  Procedure Laterality Date  . APPENDECTOMY  1955  . TUBAL LIGATION  1981  . COLONOSCOPY  11/07/1994   No Evidence of Colitis  . COLONOSCOPY  04/25/2003   Hyperplastic Polyps  . MASTECTOMY PARTIAL / LUMPECTOMY Right 05/07/2007  . EGD  01/20/2011   Gastric Ulcer  . EGD  04/14/2011   Gastric Scar Biopsy  . COLON SURGERY  2016  . COLONOSCOPY  05/14/2015   20mm Polyp @ the [facility] w/Referral to Dr. Lovenia: [facility] 04/2018; Recall Ltr mailed 03/29/2018 (dh)  . EGD  05/14/2015   No repeat per RTE  . TRABECULECTOMY Left 2017  . MASTECTOMY BILATERAL SIMPLE Left 05/09/2019  . BREAST EXCISIONAL BIOPSY Bilateral   . CATARACT EXTRACTION  2014.2016  . OTHER SURGERY Left    MASTECTOMY  . SPINE SURGERY    . Status post appendectomy in remote past.    . Status post cyst from right breast in remote past.    .  Status post resection of microcalcification of breast, 2000.    SABRA Three ERCPs as well as two EUSs       Current Outpatient Medications:  .  albuterol  (PROAIR  HFA) 90 mcg/actuation inhaler, Inhale 2 inhalations into the lungs every 4 (four) hours as needed for Wheezing, Disp: 1 each, Rfl: 11 .  ALPRAZolam  (XANAX ) 0.5 MG tablet, Take 1 tablet (0.5 mg total) by mouth 2 (two) times daily as needed for Sleep or Anxiety, Disp: 60 tablet, Rfl: 3 .  benzonatate  (TESSALON ) 200 MG capsule, Take 1 capsule (200 mg total) by mouth 3 (three) times daily as needed for Cough, Disp: 60 capsule, Rfl: 5 .  bisacodyl (DULCOLAX) 5 mg EC tablet, Take 5 mg by mouth once  daily as needed  , Disp: , Rfl:  .  brimonidine -timoloL  (COMBIGAN ) 0.2-0.5 % ophthalmic solution, Place 1 drop into both eyes 2 (two) times daily, Disp: 5 mL, Rfl: 5 .  buPROPion  (WELLBUTRIN  SR) 150 MG SR tablet, Take 1 tablet (150 mg total) by mouth 2 (two) times daily, Disp: 180 each, Rfl: 3 .  dextromethorphan  HBr 5 mg/5 mL Syrp, Take by mouth, Disp: , Rfl:  .  diphenhydramine-acetaminophen  (TYLENOL  PM) 25-500 mg per tablet, Take 2 tablets by mouth nightly as needed  , Disp: , Rfl:  .  esomeprazole (NEXIUM) 40 MG DR capsule, Take 1 capsule (40 mg total) by mouth once daily, Disp: 90 capsule, Rfl: 3 .  ipratropium (ATROVENT ) 0.06 % nasal spray, Place 2 sprays into both nostrils 3 (three) times daily, Disp: 15 mL, Rfl: 5 .  LINZESS  145 mcg capsule, TAKE ONE CAPSULE BY MOUTH DAILY, Disp: 30 capsule, Rfl: 5 .  LUMIGAN  0.01 % ophthalmic solution, Place 1 drop into both eyes 2 (two) times daily, Disp: 2.5 mL, Rfl: 5 .  metoprolol succinate (TOPROL-XL) 50 MG XL tablet, TAKE 1/2 TABLET BY MOUTH DAILY, Disp: 90 tablet, Rfl: 3 .  MYRBETRIQ  50 mg ER tablet, TAKE ONE (1) TABLET BY MOUTH EACH DAY, Disp: 90 tablet, Rfl: 3 .  nitrofurantoin, macrocrystal-monohydrate, (MACROBID) 100 MG capsule, Take 1 capsule (100 mg total) by mouth once daily for 90 days, Disp: 90 capsule, Rfl: 1 .  ondansetron  (ZOFRAN ) 4 MG tablet, Take 1 tablet (4 mg total) by mouth every 8 (eight) hours as needed for Nausea, Disp: 30 tablet, Rfl: 5 .  oxyBUTYnin (DITROPAN-XL) 10 MG XL tablet, Take 1 tablet (10 mg total) by mouth once daily, Disp: 90 tablet, Rfl: 3 .  QUEtiapine  (SEROQUEL ) 50 MG tablet, Take 1 tablet (50 mg total) by mouth at bedtime for 30 days, Disp: 30 tablet, Rfl: 5 .  rosuvastatin  (CRESTOR ) 10 MG tablet, TAKE ONE (1) TABLET BY MOUTH EVERY DAY, Disp: 90 tablet, Rfl: 1 .  SPIRIVA  RESPIMAT 2.5 mcg/actuation inhalation spray, Inhale 2 inhalations (5 mcg total) into the lungs once daily, Disp: 4 g, Rfl: 11 .  aclidinium  bromide 400 mcg/actuation AePB, Inhale into the lungs once daily   (Patient not taking: Reported on 07/06/2023), Disp: , Rfl:   Sulfa (sulfonamide antibiotics), Ciprofloxacin  (bulk), Gabapentin , Influenza virus vaccine qv 7984-83(81 thru 64 yrs), and Tetracycline  Social History   Socioeconomic History  . Marital status: Married  Tobacco Use  . Smoking status: Former    Current packs/day: 0.00    Average packs/day: 1 pack/day for 43.0 years (43.0 ttl pk-yrs)    Types: Cigarettes    Start date: 12/22/1964    Quit date: 12/23/2007    Years since  quitting: 15.5  . Smokeless tobacco: Never  Substance and Sexual Activity  . Alcohol use: No  . Drug use: No  . Sexual activity: Yes    Partners: Male    Birth control/protection: Patch    Family History  Problem Relation Name Age of Onset  . COPD Mother Johnston Potters   . Stroke Mother Johnston Potters   . Cancer Mother Johnston Potters   . Hyperlipidemia (Elevated cholesterol) Mother Johnston Potters   . High blood pressure (Hypertension) Mother Johnston Potters   . Breast cancer Mother Johnston Potters   . Hip fracture Mother Johnston Potters   . Osteoarthritis Mother Johnston Potters   . Osteoporosis (Thinning of bones) Mother Johnston Potters   . Thyroid disease Mother Johnston Potters   . Myocardial Infarction (Heart attack) Father Sudie Pouch   . Coronary Artery Disease (Blocked arteries around heart) Father Sudie Pouch     A comprehensive ROS was negative except for HPI  PE: BP 110/70   Pulse 74   Ht 162.6 cm (5' 4.02)   SpO2 96%   BMI 16.98 kg/m  The patient looks well today. She is in no distress. Neck is supple without adenopathy.  Carotids are 2+ bilaterally without bruits. Lungs are clear. Heart is regular rate and rhythm without murmurs, rubs, or gallops. Abdomen is soft, non tender, no masses or organomegaly. Lower extremities without obvious edema.   Appointment on 07/06/2023  Component Date Value Ref Range Status  . WBC (White Blood Cell Count)  07/06/2023 6.0  4.1 - 10.2 10^3/uL Final  . RBC (Red Blood Cell Count) 07/06/2023 3.50 (L)  4.04 - 5.48 10^6/uL Final  . Hemoglobin 07/06/2023 8.4 (L)  12.0 - 15.0 gm/dL Final  . Hematocrit 92/84/7975 27.7 (L)  35.0 - 47.0 % Final  . MCV (Mean Corpuscular Volume) 07/06/2023 79.1 (L)  80.0 - 100.0 fl Final  . MCH (Mean Corpuscular Hemoglobin) 07/06/2023 24.0 (L)  27.0 - 31.2 pg Final  . MCHC (Mean Corpuscular Hemoglobin * 07/06/2023 30.3 (L)  32.0 - 36.0 gm/dL Final  . Platelet Count 07/06/2023 245  150 - 450 10^3/uL Final  . RDW-CV (Red Cell Distribution Widt* 07/06/2023 18.7 (H)  11.6 - 14.8 % Final  . MPV (Mean Platelet Volume) 07/06/2023 9.5  9.4 - 12.4 fl Final  . Neutrophils 07/06/2023 3.72  1.50 - 7.80 10^3/uL Final  . Lymphocytes 07/06/2023 1.64  1.00 - 3.60 10^3/uL Final  . Monocytes 07/06/2023 0.47  0.00 - 1.50 10^3/uL Final  . Eosinophils 07/06/2023 0.11  0.00 - 0.55 10^3/uL Final  . Basophils 07/06/2023 0.05  0.00 - 0.09 10^3/uL Final  . Neutrophil % 07/06/2023 62.1  32.0 - 70.0 % Final  . Lymphocyte % 07/06/2023 27.3  10.0 - 50.0 % Final  . Monocyte % 07/06/2023 7.8  4.0 - 13.0 % Final  . Eosinophil % 07/06/2023 1.8  1.0 - 5.0 % Final  . Basophil% 07/06/2023 0.8  0.0 - 2.0 % Final  . Immature Granulocyte % 07/06/2023 0.2  <=0.7 % Final  . Immature Granulocyte Count 07/06/2023 0.01  <=0.06 10^3/L Final  Ancillary Orders on 04/07/2023  Component Date Value Ref Range Status  . Glucose 04/07/2023 102  70 - 110 mg/dL Final  . Sodium 95/83/7975 137  136 - 145 mmol/L Final  . Potassium 04/07/2023 4.3  3.6 - 5.1 mmol/L Final  . Chloride 04/07/2023 107  97 - 109 mmol/L Final  .  Carbon Dioxide (CO2) 04/07/2023 23.2  22.0 - 32.0 mmol/L Final  . Urea Nitrogen (BUN) 04/07/2023 21  7 - 25 mg/dL Final  . Creatinine 95/83/7975 1.1  0.6 - 1.1 mg/dL Final  . Glomerular Filtration Rate (eGFR) 04/07/2023 53 (L)  >60 mL/min/1.73sq m Final  . Calcium  04/07/2023 9.2  8.7 - 10.3 mg/dL Final  .  Albumin 95/83/7975 4.1  3.5 - 4.8 g/dL Final  . Phosphorus 95/83/7975 3.8  2.5 - 5.0 mg/dL Final  . WBC (White Blood Cell Count) 04/07/2023 6.6  4.1 - 10.2 10^3/uL Final  . RBC (Red Blood Cell Count) 04/07/2023 3.24 (L)  4.04 - 5.48 10^6/uL Final  . Hemoglobin 04/07/2023 8.0 (L)  12.0 - 15.0 gm/dL Final  . Hematocrit 95/83/7975 26.1 (L)  35.0 - 47.0 % Final  . MCV (Mean Corpuscular Volume) 04/07/2023 80.6  80.0 - 100.0 fl Final  . MCH (Mean Corpuscular Hemoglobin) 04/07/2023 24.7 (L)  27.0 - 31.2 pg Final  . MCHC (Mean Corpuscular Hemoglobin * 04/07/2023 30.7 (L)  32.0 - 36.0 gm/dL Final  . Platelet Count 04/07/2023 243  150 - 450 10^3/uL Final  . RDW-CV (Red Cell Distribution Widt* 04/07/2023 17.8 (H)  11.6 - 14.8 % Final  . MPV (Mean Platelet Volume) 04/07/2023 9.7  9.4 - 12.4 fl Final  . Neutrophils 04/07/2023 4.03  1.50 - 7.80 10^3/uL Final  . Lymphocytes 04/07/2023 1.78  1.00 - 3.60 10^3/uL Final  . Monocytes 04/07/2023 0.60  0.00 - 1.50 10^3/uL Final  . Eosinophils 04/07/2023 0.11  0.00 - 0.55 10^3/uL Final  . Basophils 04/07/2023 0.07  0.00 - 0.09 10^3/uL Final  . Neutrophil % 04/07/2023 60.9  32.0 - 70.0 % Final  . Lymphocyte % 04/07/2023 27.0  10.0 - 50.0 % Final  . Monocyte % 04/07/2023 9.1  4.0 - 13.0 % Final  . Eosinophil % 04/07/2023 1.7  1.0 - 5.0 % Final  . Basophil% 04/07/2023 1.1  0.0 - 2.0 % Final  . Immature Granulocyte % 04/07/2023 0.2  <=0.7 % Final  . Immature Granulocyte Count 04/07/2023 0.01  <=0.06 10^3/L Final  Office Visit on 02/03/2023  Component Date Value Ref Range Status  . Cholesterol, Total 02/03/2023 185  100 - 200 mg/dL Final  . Triglyceride 97/86/7975 62  35 - 199 mg/dL Final  . HDL (High Density Lipoprotein) Cho* 02/03/2023 101.5 (H)  35.0 - 85.0 mg/dL Final  . LDL Calculated 02/03/2023 71  0 - 130 mg/dL Final  . VLDL Cholesterol 02/03/2023 12  mg/dL Final  . Cholesterol/HDL Ratio 02/03/2023 1.8   Final  . WBC (White Blood Cell Count) 02/03/2023  6.9  4.1 - 10.2 10^3/uL Final  . RBC (Red Blood Cell Count) 02/03/2023 3.53 (L)  4.04 - 5.48 10^6/uL Final  . Hemoglobin 02/03/2023 8.6 (L)  12.0 - 15.0 gm/dL Final  . Hematocrit 97/86/7975 28.2 (L)  35.0 - 47.0 % Final  . MCV (Mean Corpuscular Volume) 02/03/2023 79.9 (L)  80.0 - 100.0 fl Final  . MCH (Mean Corpuscular Hemoglobin) 02/03/2023 24.4 (L)  27.0 - 31.2 pg Final  . MCHC (Mean Corpuscular Hemoglobin * 02/03/2023 30.5 (L)  32.0 - 36.0 gm/dL Final  . Platelet Count 02/03/2023 248  150 - 450 10^3/uL Final  . RDW-CV (Red Cell Distribution Widt* 02/03/2023 18.0 (H)  11.6 - 14.8 % Final  . MPV (Mean Platelet Volume) 02/03/2023 9.5  9.4 - 12.4 fl Final  . Neutrophils 02/03/2023 4.03  1.50 - 7.80 10^3/uL Final  . Lymphocytes  02/03/2023 2.09  1.00 - 3.60 10^3/uL Final  . Monocytes 02/03/2023 0.52  0.00 - 1.50 10^3/uL Final  . Eosinophils 02/03/2023 0.16  0.00 - 0.55 10^3/uL Final  . Basophils 02/03/2023 0.06  0.00 - 0.09 10^3/uL Final  . Neutrophil % 02/03/2023 58.7  32.0 - 70.0 % Final  . Lymphocyte % 02/03/2023 30.4  10.0 - 50.0 % Final  . Monocyte % 02/03/2023 7.6  4.0 - 13.0 % Final  . Eosinophil % 02/03/2023 2.3  1.0 - 5.0 % Final  . Basophil% 02/03/2023 0.9  0.0 - 2.0 % Final  . Immature Granulocyte % 02/03/2023 0.1  <=0.7 % Final  . Immature Granulocyte Count 02/03/2023 0.01  <=0.06 10^3/L Final  . Glucose 02/03/2023 98  70 - 110 mg/dL Final  . Sodium 97/86/7975 139  136 - 145 mmol/L Final  . Potassium 02/03/2023 4.9  3.6 - 5.1 mmol/L Final  . Chloride 02/03/2023 109  97 - 109 mmol/L Final  . Carbon Dioxide (CO2) 02/03/2023 25.0  22.0 - 32.0 mmol/L Final  . Urea Nitrogen (BUN) 02/03/2023 23  7 - 25 mg/dL Final  . Creatinine 97/86/7975 1.4 (H)  0.6 - 1.1 mg/dL Final  . Glomerular Filtration Rate (eGFR) 02/03/2023 39 (L)  >60 mL/min/1.73sq m Final  . Calcium  02/03/2023 9.3  8.7 - 10.3 mg/dL Final  . AST  97/86/7975 21  8 - 39 U/L Final  . ALT  02/03/2023 10  5 - 38 U/L Final  .  Alk Phos (alkaline Phosphatase) 02/03/2023 89  34 - 104 U/L Final  . Albumin 02/03/2023 4.3  3.5 - 4.8 g/dL Final  . Bilirubin, Total 02/03/2023 0.4  0.3 - 1.2 mg/dL Final  . Protein, Total 02/03/2023 7.2  6.1 - 7.9 g/dL Final  . A/G Ratio 97/86/7975 1.5  1.0 - 5.0 gm/dL Final  . Thyroid Stimulating Hormone (TSH) 02/03/2023 1.136  0.450-5.330 uIU/ml uIU/mL Final  . Color 02/03/2023 Light Yellow  Colorless, Straw, Light Yellow, Yellow, Dark Yellow Final  . Clarity 02/03/2023 Clear  Clear Final  . Specific Gravity 02/03/2023 1.009  1.005 - 1.030 Final  . pH, Urine 02/03/2023 5.5  5.0 - 8.0 Final  . Protein, Urinalysis 02/03/2023 Negative  Negative mg/dL Final  . Glucose, Urinalysis 02/03/2023 Negative  Negative mg/dL Final  . Ketones, Urinalysis 02/03/2023 Negative  Negative mg/dL Final  . Blood, Urinalysis 02/03/2023 Negative  Negative Final  . Nitrite, Urinalysis 02/03/2023 Negative  Negative Final  . Leukocyte Esterase, Urinalysis 02/03/2023 1+ (!)  Negative Final  . Bilirubin, Urinalysis 02/03/2023 Negative  Negative Final  . Urobilinogen, Urinalysis 02/03/2023 0.2  0.2 - 1.0 mg/dL Final  . WBC, UA 97/86/7975 3  <=5 /hpf Final  . Red Blood Cells, Urinalysis 02/03/2023 1  <=3 /hpf Final  . Bacteria, Urinalysis 02/03/2023 0-5  0 - 5 /hpf Final  . Squamous Epithelial Cells, Urinaly* 02/03/2023 0  /hpf Final  . Hemoglobin A1C 02/03/2023 5.9 (H)  4.2 - 5.6 % Final  . Average Blood Glucose (Calc) 02/03/2023 123  mg/dL Final  . Urine Culture, Routine - [facility] 02/03/2023 Final report   Final  . Result 1 - [facility] 02/03/2023 No growth   Final  Office Visit on 07/29/2022  Component Date Value Ref Range Status  . Cholesterol, Total 07/29/2022 196  100 - 200 mg/dL Final  . Triglyceride 91/91/7976 67  35 - 199 mg/dL Final  . HDL (High Density Lipoprotein) Cho* 07/29/2022 103.5 (H)  35.0 - 85.0 mg/dL Final  .  LDL Calculated 07/29/2022 79  0 - 130 mg/dL Final  . VLDL Cholesterol 07/29/2022 13   mg/dL Final  . Cholesterol/HDL Ratio 07/29/2022 1.9   Final  . WBC (White Blood Cell Count) 07/29/2022 6.9  4.1 - 10.2 10^3/uL Final  . RBC (Red Blood Cell Count) 07/29/2022 3.40 (L)  4.04 - 5.48 10^6/uL Final  . Hemoglobin 07/29/2022 9.0 (L)  12.0 - 15.0 gm/dL Final  . Hematocrit 91/91/7976 29.0 (L)  35.0 - 47.0 % Final  . MCV (Mean Corpuscular Volume) 07/29/2022 85.3  80.0 - 100.0 fl Final  . MCH (Mean Corpuscular Hemoglobin) 07/29/2022 26.5 (L)  27.0 - 31.2 pg Final  . MCHC (Mean Corpuscular Hemoglobin * 07/29/2022 31.0 (L)  32.0 - 36.0 gm/dL Final  . Platelet Count 07/29/2022 267  150 - 450 10^3/uL Final  . RDW-CV (Red Cell Distribution Widt* 07/29/2022 14.7  11.6 - 14.8 % Final  . MPV (Mean Platelet Volume) 07/29/2022 9.3 (L)  9.4 - 12.4 fl Final  . Neutrophils 07/29/2022 4.18  1.50 - 7.80 10^3/uL Final  . Lymphocytes 07/29/2022 1.98  1.00 - 3.60 10^3/uL Final  . Monocytes 07/29/2022 0.45  0.00 - 1.50 10^3/uL Final  . Eosinophils 07/29/2022 0.16  0.00 - 0.55 10^3/uL Final  . Basophils 07/29/2022 0.06  0.00 - 0.09 10^3/uL Final  . Neutrophil % 07/29/2022 61.0  32.0 - 70.0 % Final  . Lymphocyte % 07/29/2022 28.9  10.0 - 50.0 % Final  . Monocyte % 07/29/2022 6.6  4.0 - 13.0 % Final  . Eosinophil % 07/29/2022 2.3  1.0 - 5.0 % Final  . Basophil% 07/29/2022 0.9  0.0 - 2.0 % Final  . Immature Granulocyte % 07/29/2022 0.3  <=0.7 % Final  . Immature Granulocyte Count 07/29/2022 0.02  <=0.06 10^3/L Final  . Glucose 07/29/2022 93  70 - 110 mg/dL Final  . Sodium 91/91/7976 140  136 - 145 mmol/L Final  . Potassium 07/29/2022 4.5  3.6 - 5.1 mmol/L Final  . Chloride 07/29/2022 107  97 - 109 mmol/L Final  . Carbon Dioxide (CO2) 07/29/2022 26.1  22.0 - 32.0 mmol/L Final  . Urea Nitrogen (BUN) 07/29/2022 21  7 - 25 mg/dL Final  . Creatinine 91/91/7976 1.5 (H)  0.6 - 1.1 mg/dL Final  . Glomerular Filtration Rate (eGFR) 07/29/2022 34 (L)  >60 mL/min/1.73sq m Final  . Calcium  07/29/2022 9.5  8.7 -  10.3 mg/dL Final  . AST  91/91/7976 19  8 - 39 U/L Final  . ALT  07/29/2022 13  5 - 38 U/L Final  . Alk Phos (alkaline Phosphatase) 07/29/2022 71  34 - 104 U/L Final  . Albumin 07/29/2022 4.3  3.5 - 4.8 g/dL Final  . Bilirubin, Total 07/29/2022 0.4  0.3 - 1.2 mg/dL Final  . Protein, Total 07/29/2022 6.8  6.1 - 7.9 g/dL Final  . A/G Ratio 91/91/7976 1.7  1.0 - 5.0 gm/dL Final  . Hemoglobin J8R 07/29/2022 5.8 (H)  4.2 - 5.6 % Final  . Average Blood Glucose (Calc) 07/29/2022 120  mg/dL Final   DIAGNOSIS: Primary hypertension  (primary encounter diagnosis)  Gait instability  Panlobular emphysema (CMS/HHS-HCC)  Prediabetes  Mixed hyperlipidemia  Bipolar affective disorder, current episode mixed, current episode severity unspecified (CMS/HHS-HCC)  Chronic pancreatitis, unspecified pancreatitis type (CMS/HHS-HCC)  Stage 3 chronic kidney disease, unspecified whether stage 3a or 3b CKD (CMS/HHS-HCC)   PLAN: No change in care. Labs today. RTC 4-6 mo, sooner if needed     Attestation Statement:  I personally performed the service. (TP)  Reyes JONETTA Costa, MD, MD

## 2024-09-09 ENCOUNTER — Emergency Department (HOSPITAL_BASED_OUTPATIENT_CLINIC_OR_DEPARTMENT_OTHER)

## 2024-09-09 ENCOUNTER — Encounter (HOSPITAL_BASED_OUTPATIENT_CLINIC_OR_DEPARTMENT_OTHER): Payer: Self-pay | Admitting: Emergency Medicine

## 2024-09-09 ENCOUNTER — Other Ambulatory Visit: Payer: Self-pay

## 2024-09-09 ENCOUNTER — Inpatient Hospital Stay (HOSPITAL_BASED_OUTPATIENT_CLINIC_OR_DEPARTMENT_OTHER)
Admission: EM | Admit: 2024-09-09 | Discharge: 2024-09-11 | DRG: 177 | Disposition: A | Discharge status: Home Health | Attending: Internal Medicine | Admitting: Internal Medicine

## 2024-09-09 DIAGNOSIS — J1282 Pneumonia due to coronavirus disease 2019: Secondary | ICD-10-CM | POA: Diagnosis present

## 2024-09-09 DIAGNOSIS — I2699 Other pulmonary embolism without acute cor pulmonale: Secondary | ICD-10-CM | POA: Diagnosis not present

## 2024-09-09 DIAGNOSIS — I10 Essential (primary) hypertension: Secondary | ICD-10-CM | POA: Diagnosis present

## 2024-09-09 DIAGNOSIS — I2693 Single subsegmental pulmonary embolism without acute cor pulmonale: Secondary | ICD-10-CM | POA: Diagnosis present

## 2024-09-09 DIAGNOSIS — M1990 Unspecified osteoarthritis, unspecified site: Secondary | ICD-10-CM | POA: Diagnosis present

## 2024-09-09 DIAGNOSIS — Z66 Do not resuscitate: Secondary | ICD-10-CM | POA: Diagnosis present

## 2024-09-09 DIAGNOSIS — I129 Hypertensive chronic kidney disease with stage 1 through stage 4 chronic kidney disease, or unspecified chronic kidney disease: Secondary | ICD-10-CM | POA: Diagnosis present

## 2024-09-09 DIAGNOSIS — Z803 Family history of malignant neoplasm of breast: Secondary | ICD-10-CM

## 2024-09-09 DIAGNOSIS — K861 Other chronic pancreatitis: Secondary | ICD-10-CM | POA: Diagnosis present

## 2024-09-09 DIAGNOSIS — R7303 Prediabetes: Secondary | ICD-10-CM | POA: Diagnosis present

## 2024-09-09 DIAGNOSIS — Z743 Need for continuous supervision: Secondary | ICD-10-CM | POA: Diagnosis not present

## 2024-09-09 DIAGNOSIS — Z170 Estrogen receptor positive status [ER+]: Secondary | ICD-10-CM

## 2024-09-09 DIAGNOSIS — E785 Hyperlipidemia, unspecified: Secondary | ICD-10-CM | POA: Diagnosis present

## 2024-09-09 DIAGNOSIS — Z87891 Personal history of nicotine dependence: Secondary | ICD-10-CM | POA: Diagnosis not present

## 2024-09-09 DIAGNOSIS — Z8711 Personal history of peptic ulcer disease: Secondary | ICD-10-CM

## 2024-09-09 DIAGNOSIS — Z881 Allergy status to other antibiotic agents status: Secondary | ICD-10-CM

## 2024-09-09 DIAGNOSIS — D631 Anemia in chronic kidney disease: Secondary | ICD-10-CM | POA: Diagnosis present

## 2024-09-09 DIAGNOSIS — I509 Heart failure, unspecified: Principal | ICD-10-CM | POA: Insufficient documentation

## 2024-09-09 DIAGNOSIS — F419 Anxiety disorder, unspecified: Secondary | ICD-10-CM | POA: Diagnosis present

## 2024-09-09 DIAGNOSIS — I159 Secondary hypertension, unspecified: Secondary | ICD-10-CM | POA: Diagnosis not present

## 2024-09-09 DIAGNOSIS — Z853 Personal history of malignant neoplasm of breast: Secondary | ICD-10-CM

## 2024-09-09 DIAGNOSIS — U071 COVID-19: Secondary | ICD-10-CM | POA: Diagnosis present

## 2024-09-09 DIAGNOSIS — J441 Chronic obstructive pulmonary disease with (acute) exacerbation: Secondary | ICD-10-CM | POA: Diagnosis present

## 2024-09-09 DIAGNOSIS — J440 Chronic obstructive pulmonary disease with acute lower respiratory infection: Secondary | ICD-10-CM | POA: Diagnosis present

## 2024-09-09 DIAGNOSIS — R0602 Shortness of breath: Secondary | ICD-10-CM | POA: Diagnosis present

## 2024-09-09 DIAGNOSIS — H409 Unspecified glaucoma: Secondary | ICD-10-CM | POA: Diagnosis present

## 2024-09-09 DIAGNOSIS — G20A1 Parkinson's disease without dyskinesia, without mention of fluctuations: Secondary | ICD-10-CM | POA: Diagnosis present

## 2024-09-09 DIAGNOSIS — Z882 Allergy status to sulfonamides status: Secondary | ICD-10-CM

## 2024-09-09 DIAGNOSIS — F319 Bipolar disorder, unspecified: Secondary | ICD-10-CM | POA: Diagnosis present

## 2024-09-09 DIAGNOSIS — R531 Weakness: Secondary | ICD-10-CM | POA: Insufficient documentation

## 2024-09-09 DIAGNOSIS — N1831 Chronic kidney disease, stage 3a: Secondary | ICD-10-CM | POA: Diagnosis present

## 2024-09-09 DIAGNOSIS — R059 Cough, unspecified: Secondary | ICD-10-CM | POA: Diagnosis not present

## 2024-09-09 DIAGNOSIS — R7989 Other specified abnormal findings of blood chemistry: Secondary | ICD-10-CM | POA: Insufficient documentation

## 2024-09-09 DIAGNOSIS — K219 Gastro-esophageal reflux disease without esophagitis: Secondary | ICD-10-CM | POA: Diagnosis present

## 2024-09-09 DIAGNOSIS — Z87440 Personal history of urinary (tract) infections: Secondary | ICD-10-CM

## 2024-09-09 DIAGNOSIS — Z79899 Other long term (current) drug therapy: Secondary | ICD-10-CM

## 2024-09-09 DIAGNOSIS — Z887 Allergy status to serum and vaccine status: Secondary | ICD-10-CM

## 2024-09-09 DIAGNOSIS — R7401 Elevation of levels of liver transaminase levels: Secondary | ICD-10-CM | POA: Insufficient documentation

## 2024-09-09 DIAGNOSIS — C50912 Malignant neoplasm of unspecified site of left female breast: Secondary | ICD-10-CM | POA: Diagnosis present

## 2024-09-09 DIAGNOSIS — K5909 Other constipation: Secondary | ICD-10-CM | POA: Diagnosis present

## 2024-09-09 LAB — CBC
HCT: 27.5 % — ABNORMAL LOW (ref 36.0–46.0)
Hemoglobin: 8.2 g/dL — ABNORMAL LOW (ref 12.0–15.0)
MCH: 22.8 pg — ABNORMAL LOW (ref 26.0–34.0)
MCHC: 29.8 g/dL — ABNORMAL LOW (ref 30.0–36.0)
MCV: 76.6 fL — ABNORMAL LOW (ref 80.0–100.0)
Platelets: 297 K/uL (ref 150–400)
RBC: 3.59 MIL/uL — ABNORMAL LOW (ref 3.87–5.11)
RDW: 22.7 % — ABNORMAL HIGH (ref 11.5–15.5)
WBC: 5.8 K/uL (ref 4.0–10.5)
nRBC: 0.3 % — ABNORMAL HIGH (ref 0.0–0.2)

## 2024-09-09 LAB — COMPREHENSIVE METABOLIC PANEL WITH GFR
ALT: 47 U/L — ABNORMAL HIGH (ref 0–44)
AST: 44 U/L — ABNORMAL HIGH (ref 15–41)
Albumin: 3.8 g/dL (ref 3.5–5.0)
Alkaline Phosphatase: 72 U/L (ref 38–126)
Anion gap: 14 (ref 5–15)
BUN: 21 mg/dL (ref 8–23)
CO2: 21 mmol/L — ABNORMAL LOW (ref 22–32)
Calcium: 9.1 mg/dL (ref 8.9–10.3)
Chloride: 104 mmol/L (ref 98–111)
Creatinine, Ser: 1.33 mg/dL — ABNORMAL HIGH (ref 0.44–1.00)
GFR, Estimated: 41 mL/min — ABNORMAL LOW (ref >60)
Glucose, Bld: 96 mg/dL (ref 70–99)
Potassium: 4.1 mmol/L (ref 3.5–5.1)
Sodium: 138 mmol/L (ref 135–145)
Total Bilirubin: 0.4 mg/dL (ref 0.0–1.2)
Total Protein: 7.3 g/dL (ref 6.5–8.1)

## 2024-09-09 LAB — RESP PANEL BY RT-PCR (RSV, FLU A&B, COVID)  RVPGX2
Influenza A by PCR: NEGATIVE
Influenza B by PCR: NEGATIVE
Resp Syncytial Virus by PCR: NEGATIVE
SARS Coronavirus 2 by RT PCR: POSITIVE — AB

## 2024-09-09 LAB — TROPONIN T, HIGH SENSITIVITY
Troponin T High Sensitivity: 74 ng/L — ABNORMAL HIGH (ref 0–19)
Troponin T High Sensitivity: 61 ng/L — ABNORMAL HIGH (ref 0–19)

## 2024-09-09 LAB — PRO BRAIN NATRIURETIC PEPTIDE: Pro Brain Natriuretic Peptide: 2169 pg/mL — ABNORMAL HIGH (ref <300.0)

## 2024-09-09 MED ORDER — GUAIFENESIN-DM 100-10 MG/5ML PO SYRP
5.0000 mL | ORAL_SOLUTION | Freq: Four times a day (QID) | ORAL | Status: DC | PRN
  Administered 2024-09-09: 5 mL via ORAL
  Filled 2024-09-09: qty 5

## 2024-09-09 MED ORDER — UMECLIDINIUM BROMIDE 62.5 MCG/ACT IN AEPB
1.0000 | INHALATION_SPRAY | Freq: Every day | RESPIRATORY_TRACT | Status: DC
  Administered 2024-09-10 – 2024-09-11 (×2): 1 via RESPIRATORY_TRACT
  Filled 2024-09-09: qty 7

## 2024-09-09 MED ORDER — ALBUTEROL SULFATE (2.5 MG/3ML) 0.083% IN NEBU: 2.5000 mg | INHALATION_SOLUTION | Freq: Four times a day (QID) | RESPIRATORY_TRACT | Status: DC | PRN

## 2024-09-09 MED ORDER — ENOXAPARIN SODIUM 30 MG/0.3ML IJ SOSY
30.0000 mg | PREFILLED_SYRINGE | INTRAMUSCULAR | Status: DC
  Administered 2024-09-09: 30 mg via SUBCUTANEOUS
  Filled 2024-09-09: qty 0.3

## 2024-09-09 MED ORDER — LINACLOTIDE 145 MCG PO CAPS
145.0000 ug | ORAL_CAPSULE | Freq: Every day | ORAL | Status: DC
  Administered 2024-09-10 – 2024-09-11 (×2): 145 ug via ORAL
  Filled 2024-09-09 (×2): qty 1

## 2024-09-09 MED ORDER — LATANOPROST 0.005 % OP SOLN
1.0000 [drp] | Freq: Every day | OPHTHALMIC | Status: DC
Start: 2024-09-10 — End: 2024-09-11
  Administered 2024-09-10: 1 [drp] via OPHTHALMIC
  Filled 2024-09-09: qty 2.5

## 2024-09-09 MED ORDER — ROSUVASTATIN CALCIUM 5 MG PO TABS
10.0000 mg | ORAL_TABLET | Freq: Every day | ORAL | Status: DC
Start: 2024-09-10 — End: 2024-09-09

## 2024-09-09 MED ORDER — CEFTRIAXONE SODIUM 1 G IJ SOLR
1.0000 g | Freq: Once | INTRAMUSCULAR | Status: AC
  Administered 2024-09-09: 1 g via INTRAVENOUS
  Filled 2024-09-09: qty 10

## 2024-09-09 MED ORDER — BUPROPION HCL ER (XL) 300 MG PO TB24
300.0000 mg | ORAL_TABLET | Freq: Every day | ORAL | Status: DC
  Administered 2024-09-10 – 2024-09-11 (×2): 300 mg via ORAL
  Filled 2024-09-09 (×2): qty 1

## 2024-09-09 MED ORDER — SODIUM CHLORIDE 0.9 % IV SOLN
100.0000 mg | Freq: Every day | INTRAVENOUS | Status: AC
  Administered 2024-09-10 – 2024-09-11 (×2): 100 mg via INTRAVENOUS
  Filled 2024-09-09 (×2): qty 20

## 2024-09-09 MED ORDER — QUETIAPINE FUMARATE 50 MG PO TABS
50.0000 mg | ORAL_TABLET | Freq: Every day | ORAL | Status: DC
  Administered 2024-09-10 (×2): 50 mg via ORAL
  Filled 2024-09-09 (×2): qty 1

## 2024-09-09 MED ORDER — PANTOPRAZOLE SODIUM 40 MG PO TBEC
40.0000 mg | DELAYED_RELEASE_TABLET | Freq: Every day | ORAL | Status: DC
  Administered 2024-09-10 – 2024-09-11 (×2): 40 mg via ORAL
  Filled 2024-09-09 (×2): qty 1

## 2024-09-09 MED ORDER — TIMOLOL MALEATE 0.5 % OP SOLN
1.0000 [drp] | Freq: Two times a day (BID) | OPHTHALMIC | Status: DC
  Administered 2024-09-10: 1 [drp] via OPHTHALMIC
  Filled 2024-09-09: qty 5

## 2024-09-09 MED ORDER — TIOTROPIUM BROMIDE MONOHYDRATE 2.5 MCG/ACT IN AERS: 2.0000 | INHALATION_SPRAY | Freq: Every day | RESPIRATORY_TRACT | Status: DC

## 2024-09-09 MED ORDER — REMDESIVIR 100 MG IV SOLR
200.0000 mg | Freq: Once | INTRAVENOUS | Status: AC
  Administered 2024-09-09: 200 mg via INTRAVENOUS
  Filled 2024-09-09: qty 40

## 2024-09-09 MED ORDER — SODIUM CHLORIDE 0.9 % IV BOLUS
500.0000 mL | Freq: Once | INTRAVENOUS | Status: AC
  Administered 2024-09-09: 500 mL via INTRAVENOUS

## 2024-09-09 MED ORDER — BRIMONIDINE TARTRATE 0.2 % OP SOLN
1.0000 [drp] | Freq: Two times a day (BID) | OPHTHALMIC | Status: DC
  Administered 2024-09-10: 1 [drp] via OPHTHALMIC
  Filled 2024-09-09: qty 5

## 2024-09-09 MED ORDER — ALPRAZOLAM 0.5 MG PO TABS: 0.5000 mg | ORAL_TABLET | Freq: Two times a day (BID) | ORAL | Status: DC | PRN

## 2024-09-09 MED ORDER — BRIMONIDINE TARTRATE-TIMOLOL 0.2-0.5 % OP SOLN
1.0000 [drp] | Freq: Two times a day (BID) | OPHTHALMIC | Status: DC
Start: 2024-09-10 — End: 2024-09-09

## 2024-09-09 MED ORDER — FUROSEMIDE 10 MG/ML IJ SOLN
20.0000 mg | Freq: Two times a day (BID) | INTRAMUSCULAR | Status: DC
  Administered 2024-09-09: 20 mg via INTRAVENOUS
  Filled 2024-09-09: qty 2

## 2024-09-09 MED ORDER — AZITHROMYCIN 250 MG PO TABS
500.0000 mg | ORAL_TABLET | Freq: Once | ORAL | Status: AC
  Administered 2024-09-09: 500 mg via ORAL
  Filled 2024-09-09: qty 2

## 2024-09-09 MED ORDER — FLUTICASONE FUROATE-VILANTEROL 100-25 MCG/ACT IN AEPB
1.0000 | INHALATION_SPRAY | Freq: Every day | RESPIRATORY_TRACT | Status: DC
  Administered 2024-09-10 – 2024-09-11 (×2): 1 via RESPIRATORY_TRACT
  Filled 2024-09-09 (×2): qty 28

## 2024-09-09 NOTE — ED Notes (Signed)
 [facility] at bedside

## 2024-09-09 NOTE — ED Triage Notes (Signed)
 Shortness of breath and new cough x 1 week , Hx COPD ,  Per husband pt has been lethargic since multiple teeth extraction 2 days .  No fever .

## 2024-09-09 NOTE — ED Provider Notes (Signed)
 [place] [facility] Provider Note   CSN: [id number] Arrival date & time: 09/09/24  1006     Patient presents with: Shortness of Breath   Elaine Clayton is a 78 y.o. female.    Shortness of Breath    Patient has a history of COPD UTIs pancreatitis breast cancer osteoarthritis memory difficulty.  Patient had a dental procedure over a week ago.  She had multiple teeth extracted.  Husband states since that time she has been rather fatigued.  She has been instructed only to drink liquids and not chew anything hard.  Her appetite has been somewhat diminished.  She has been coughing a lot.  She has not had any shortness of breath.  No fevers.  No vomiting or diarrhea.  Prior to Admission medications   Medication Sig Start Date End Date Taking? Authorizing Provider  albuterol  (PROVENTIL ) (2.5 MG/3ML) 0.083% nebulizer solution Take 3 mLs (2.5 mg total) by nebulization every 6 (six) hours as needed for wheezing or shortness of breath. 09/12/19   Kassie Acquanetta Bradley, MD  ALPRAZolam  (XANAX ) 0.5 MG tablet Take 0.5-1 mg by mouth See admin instructions. Take 1 tablet in the AM and 1/2 tablet at bedtime.    [provider]  AZOPT  1 % ophthalmic suspension Place 1 drop into the left eye daily.  06/26/17   [provider]  benzonatate  (TESSALON ) 100 MG capsule Take 1 capsule (100 mg total) by mouth 3 (three) times daily as needed for cough. 01/02/20   Kassie Acquanetta Bradley, MD  bisacodyl (DULCOLAX) 5 MG EC tablet Take 5 mg by mouth daily as needed for moderate constipation.    [provider]  Brimonidine  Tartrate-Timolol  (COMBIGAN  OP) Apply 1 drop to eye 2 (two) times daily. Both eyes    [provider]  buPROPion  (WELLBUTRIN  SR) 150 MG 12 hr tablet Take 300 mg by mouth daily.     [provider]  dextromethorphan  15 MG/5ML syrup Take 10 mLs by mouth 4 (four) times daily as needed for cough.    [provider]   escitalopram (LEXAPRO) 10 MG tablet Take 15 mg by mouth at bedtime.  05/14/17   [provider]  esomeprazole (NEXIUM) 40 MG capsule Take 40 mg by mouth daily. 04/16/19   [provider]  fluticasone  furoate-vilanterol (BREO ELLIPTA ) 100-25 MCG/INH AEPB Inhale 1 puff into the lungs daily. 09/12/19   Kassie Acquanetta Bradley, MD  ipratropium (ATROVENT ) 0.06 % nasal spray Place 2 sprays into both nostrils 4 (four) times daily as needed for rhinitis.    [provider]  LUMIGAN  0.01 % SOLN Place 1 drop into both eyes at bedtime.  03/06/17   [provider]  metoprolol succinate (TOPROL-XL) 50 MG 24 hr tablet Take 12.5 mg by mouth daily. 06/28/19 06/27/20  [provider]  nicotine  polacrilex (NICORETTE ) 2 MG gum Take 2 mg by mouth as needed for smoking cessation.    [provider]  ondansetron  (ZOFRAN ) 4 MG tablet Take 4 mg by mouth every 6 (six) hours as needed for nausea/vomiting. 03/04/19   [provider]  rosuvastatin  (CRESTOR ) 10 MG tablet Take 10 mg by mouth at bedtime.     [provider]  tamoxifen  (NOLVADEX ) 20 MG tablet Take 1 tablet (20 mg total) by mouth daily. 03/25/19   Dessa Reyes ORN, MD  tamsulosin (FLOMAX) 0.4 MG CAPS capsule Take 0.4 mg by mouth every other day. 09/12/20   [provider]  Tiotropium Bromide  Monohydrate (SPIRIVA  RESPIMAT) 2.5 MCG/ACT AERS Inhale 2 Inhalers into the lungs daily. 09/12/19 12/11/19  Kassie Acquanetta Bradley, MD  Tiotropium Bromide  Monohydrate (SPIRIVA  RESPIMAT) 2.5 MCG/ACT AERS Inhale 2 puffs into the lungs daily. 09/12/19   Kassie Acquanetta Bradley, MD  mirabegron  ER (MYRBETRIQ ) 25 MG TB24 tablet Take 1 tablet (25 mg total) by mouth daily. 05/17/20 05/29/20  Helon Kirsch A, PA-C    Allergies: Influenza vaccines, Influenza virus vaccine, Gabapentin , Sulfa antibiotics, and Tetracyclines & related    Review of Systems  Respiratory:  Positive for shortness of breath.     Updated Vital Signs BP (!)  168/73   Pulse 83   Temp 97.7 F (36.5 C)   Resp 17   Wt 45.4 kg   SpO2 98%   BMI 16.90 kg/m   Physical Exam Vitals and nursing note reviewed.  Constitutional:      Appearance: She is well-developed. She is not diaphoretic.  HENT:     Head: Normocephalic and atraumatic.     Comments: No oropharyngeal swelling or lesions noted    Right Ear: External ear normal.     Left Ear: External ear normal.     Mouth/Throat:     Pharynx: No oropharyngeal exudate.  Eyes:     General: No scleral icterus.       Right eye: No discharge.        Left eye: No discharge.     Conjunctiva/sclera: Conjunctivae normal.  Neck:     Trachea: No tracheal deviation.  Cardiovascular:     Rate and Rhythm: Normal rate and regular rhythm.  Pulmonary:     Effort: Pulmonary effort is normal. No respiratory distress.     Breath sounds: Normal breath sounds. No stridor. No wheezing or rales.  Abdominal:     General: Bowel sounds are normal. There is no distension.     Palpations: Abdomen is soft.     Tenderness: There is no abdominal tenderness. There is no guarding or rebound.  Musculoskeletal:        General: No tenderness or deformity.     Cervical back: Neck supple.     Right lower leg: No tenderness. No edema.     Left lower leg: No tenderness. No edema.  Skin:    General: Skin is warm and dry.     Findings: No rash.  Neurological:     General: No focal deficit present.     Mental Status: She is alert.     Cranial Nerves: No cranial nerve deficit, dysarthria or facial asymmetry.     Sensory: No sensory deficit.     Motor: No abnormal muscle tone or seizure activity.     Coordination: Coordination normal.  Psychiatric:        Mood and Affect: Mood normal.     (all labs ordered are listed, but only abnormal results are displayed) Labs Reviewed  RESP PANEL BY RT-PCR (RSV, FLU A&B, COVID)  RVPGX2 - Abnormal; Notable for the following components:      Result Value   SARS Coronavirus 2 by RT  PCR POSITIVE (*)    All other components within normal limits  CBC - Abnormal; Notable for the following components:   RBC 3.59 (*)    Hemoglobin 8.2 (*)    HCT 27.5 (*)    MCV 76.6 (*)    MCH 22.8 (*)    MCHC 29.8 (*)    RDW 22.7 (*)    nRBC 0.3 (*)  All other components within normal limits  COMPREHENSIVE METABOLIC PANEL WITH GFR - Abnormal; Notable for the following components:   CO2 21 (*)    Creatinine, Ser 1.33 (*)    AST 44 (*)    ALT 47 (*)    GFR, Estimated 41 (*)    All other components within normal limits  PRO BRAIN NATRIURETIC PEPTIDE - Abnormal; Notable for the following components:   Pro Brain Natriuretic Peptide 2,169.0 (*)    All other components within normal limits  TROPONIN T, HIGH SENSITIVITY - Abnormal; Notable for the following components:   Troponin T High Sensitivity 74 (*)    All other components within normal limits  TROPONIN T, HIGH SENSITIVITY - Abnormal; Notable for the following components:   Troponin T High Sensitivity 61 (*)    All other components within normal limits  URINALYSIS, W/ REFLEX TO CULTURE (INFECTION SUSPECTED)    EKG: EKG Interpretation Date/Time:  Friday September 09 2024 10:26:30 EDT Ventricular Rate:  82 PR Interval:  137 QRS Duration:  98 QT Interval:  387 QTC Calculation: 452 R Axis:   72  Text Interpretation: Sinus rhythm LVH with secondary repolarization abnormality , new since last tracing Confirmed by Randol Simmonds [phone]) on 09/09/2024 10:34:00 AM  Radiology: [facility] Chest 2 View Result Date: 09/09/2024 EXAM: 2 VIEW(S) XRAY OF THE CHEST 09/09/2024 11:38:00 AM COMPARISON: 11/21/2015 CLINICAL HISTORY: Shortness of breath and new cough x 1 week, history of COPD. Per husband, patient has been lethargic since multiple teeth extraction 2 days ago. No fever. FINDINGS: LUNGS AND PLEURA: Bilateral hyperaerated lungs. Patchy opacity in the right mid lung may represent developing infection. Dense, nodular opacity along the left lower  lung on the frontal view without definite correlate on the lateral may represent dystrophic calcification in the setting of prior mastectomy. No pleural effusion. No pneumothorax. HEART AND MEDIASTINUM: Aortic calcifications. No acute abnormality of the cardiac and mediastinal silhouettes. BONES AND SOFT TISSUES: No acute osseous abnormality. IMPRESSION: 1. Patchy right mid-lung opacity, which may represent developing infection. Follow-up imaging is recommended to ensure resolution. 2. Dense nodular opacity along the left lower lung on the frontal view without definite lateral correlate, which may represent dystrophic calcification in the setting of prior mastectomy. Electronically signed by: Ryan Chess MD 09/09/2024 12:23 PM EDT RP Workstation: [id number]     .Critical Care  Performed by: Randol Simmonds, MD Authorized by: Randol Simmonds, MD   Critical care provider statement:    Critical care time (minutes):  30   Critical care was time spent personally by me on the following activities:  Development of treatment plan with patient or surrogate, discussions with consultants, evaluation of patient's response to treatment, examination of patient, ordering and review of laboratory studies, ordering and review of radiographic studies, ordering and performing treatments and interventions, pulse oximetry, re-evaluation of patient's condition and review of old charts    Medications Ordered in the ED  sodium chloride  0.9 % bolus 500 mL (0 mLs Intravenous Stopped 09/09/24 1138)  cefTRIAXone  (ROCEPHIN ) 1 g in sodium chloride  0.9 % 100 mL IVPB (0 g Intravenous Stopped 09/09/24 1414)  azithromycin  (ZITHROMAX ) tablet 500 mg (500 mg Oral Given 09/09/24 1415)    Clinical Course as of 09/09/24 1532  Fri Sep 09, 2024  1058 CBC(!) Hgb similar to previous.  Hemoglobin was 8.4 in July 2024  [JK]  1111 Pro Brain natriuretic peptide(!) BNP elevated.  Troponin elevated [JK]  1138 Resp panel by RT-PCR (RSV, Flu A&B,  Covid) Anterior Nasal Swab(!) Positive for COVID [JK]  1252 Chest x-ray shows patchy right midlung opacity which could be developing infection [JK]  1352 Family requested transfer to [facility].  We have attempted to get in contact with them regarding transfer.  ED secretary unable to get in touch with anyone from that facility.  Discussed with family that we will admit to [facility] system [JK]  1531 Case discussed with Dr Georgina [JK]    Clinical Course User Index [JK] Randol Simmonds, MD                                 Medical Decision Making Frontal diagnosis includes but not limited to acute renal failure, pneumonia, CHF, covid, dehydration  Problems Addressed: Congestive heart failure, unspecified HF chronicity, unspecified heart failure type [facility]): acute illness or injury that poses a threat to life or bodily functions COVID-19 virus infection: acute illness or injury  Amount and/or Complexity of Data Reviewed Labs: ordered. Decision-making details documented in ED Course. Radiology: ordered and independent interpretation performed.  Risk Prescription drug management. Decision regarding hospitalization.   Patient presented with complaints of increasing fatigue, cough, weakness.  Patient recently had dental work done.  She has had decreased p.o. intake.  Patient has had frequent coughing  Family was concerned about dehydration as patient had not been eating or drinking as much.  Patient was given additional 500 cc fluid bolus.  Patient's laboratory test are notable for anemia but this is similar compared to previous values.  Patient's creatinine is increased compared to previous.  Suspect component of dehydration.  BNP however significantly elevated as is troponin slightly elevated.  Suspect CHF as a component.  No prior for comparison so unclear if this is more chronic findings versus acute on chronic.  She does not have overt pulmonary edema on chest x-ray.  Patient's COVID  test is also positive.  I suspect this is also contributing to her cough and congestion.  Patient has been started on IV antibiotics.  For possible pneumonia is noted on x-ray although this may be viral in nature.  I do think she would benefit from admission to the hospital for echocardiogram and further cardiac evaluation.  Patient request transfer to [facility] hospital     Final diagnoses:  Congestive heart failure, unspecified HF chronicity, unspecified heart failure type [facility])  COVID-19 virus infection    ED Discharge Orders     None          Randol Simmonds, MD 09/09/24 [phone]

## 2024-09-09 NOTE — Plan of Care (Signed)
 Hospital Medicine Transfer Accept Note Patient Name/Age: Elaine Clayton / 78 y.o. MRN: [id number] Admission Date: 09/09/2024  Once successfully transferred to the appropriate floor, TRH will assume care for the patient above.  A/P:  43F h/o Parkinson's, BPD, breast cancer, COPD, recent oral surgery 2 days ago p/w AHRF iso COVID-19 PNA and HFpEF exacerbation. CXR c/f RML PNA, and pro-BNP >2k. Pt will need diuresis and remdesivir /dexamethasone  on admission.  Marsha Ada, MD Attending Physician Division of [facility] Medicine [facility] September 09, 2024 3:30 PM

## 2024-09-09 NOTE — H&P (Signed)
 History and Physical    Elaine Clayton FMW:[id number] DOB: 1946-05-22 DOA: 09/09/2024  PCP: Auston Reyes BIRCH, MD  Patient coming from: [facility] ED  Chief Complaint: Shortness of breath  HPI: Elaine Clayton is a 78 y.o. female with medical history significant of Parkinson's disease, bipolar disorder, breast cancer, COPD, anemia, CKD stage 3a, prediabetes, hypertension, hyperlipidemia, hyperthyroidism, chronic pancreatitis presenting with a chief complaint of shortness of breath.  Patient is reporting progressively worsening shortness of breath and cough for the past 2 or 3 weeks.  Denies fevers.  Reports having some generalized chest pain/discomfort only when she coughs but not otherwise.  Denies history of blood clots.  She is reporting poor p.o. intake but also reports that all of her teeth were removed by her dentist 3 weeks ago.  She is currently using dentures.  Denies nausea, vomiting, abdominal pain, diarrhea, or any urinary symptoms.  ED Course: Vital signs on arrival: Temperature 96.9 F, pulse 80, respiratory rate 16, blood pressure 115/60, and SpO2 99% on room air.  EKG showing sinus rhythm and ST depressions/T wave inversions inferolaterally which appear new compared to previous EKG in the chart from March 2020.  Labs notable for SARS-CoV-2 PCR positive, no leukocytosis, hemoglobin 8.2 (at baseline), bicarb 21, creatinine 1.3 (baseline 1.1), AST 44, ALT 47, alk phos and T. bili normal, troponin 74> 61, proBNP 2169.  Chest x-ray showing patchy right midlung opacity concerning for pneumonia.  Patient was given ceftriaxone , azithromycin , and 500 mL IV fluids.  Review of Systems:  Review of Systems  All other systems reviewed and are negative.   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  ER: 90%; PR: Neg, Her 2 neu not overexpressed.    Broken foot 2015   right foot   Chronic gastritis    Chronic kidney disease    Dr Marcelino   COPD (chronic obstructive pulmonary disease) (HCC)    Dizziness    inner ear (per pt) several times per week   Dysphagia    Falls frequently    Glaucoma    Hyperlipemia    Hypertension    pt has recently come off BP meds.  MD aware. BP seems stable.   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    Memory difficulty    Neck stiffness    s/p C3-C4 fusion, limited right turn   Osteoarthritis    back   Pancreatitis    Parkinson disease (HCC)    Pituitary microadenoma (HCC)    Recurrent UTI    Renal insufficiency    Stomach ulcer 2112    Past Surgical History:  Procedure Laterality Date   APPENDECTOMY     BREAST BIOPSY Right 2011   neg   BREAST BIOPSY Left 02/09/2019   affirm stereo 2 areas/group 1 x clip/ group 2 coil clip/path pending   BREAST EXCISIONAL BIOPSY Right 2001   neg   BREAST EXCISIONAL BIOPSY Right 2008   Invasive tubular carcinoma breast ca 2008 radiation   BREAST EXCISIONAL BIOPSY Left 10/26/2012   neg   BREAST SURGERY Left  2013   left breast wide excision,Intraductal papilloma, ductal hyperplasia and sclerosing adenosis. Microcalcifications associated with columnar cell change. No evidence of atypia or malignancy. Margins are unremarkable.   BREAST SURGERY Right November 06, 1999   multiple areas of microcalcification showing evidence of sclerosing adenosis and ductal hyperplasia.   BREAST SURGERY Left May 07, 2007   wide excision.   cataract Right 2013   CATARACT EXTRACTION W/PHACO Left 03/05/2016   Procedure: CATARACT EXTRACTION PHACO AND INTRAOCULAR LENS PLACEMENT (IOC);  Surgeon: Dene Etienne, MD;   Location: [facility];  Service: Ophthalmology;  Laterality: Left;   COLON SURGERY     COLONOSCOPY  2004   COLONOSCOPY N/A 05/14/2015   Procedure: COLONOSCOPY;  Surgeon: Lamar ONEIDA Holmes, MD;  Location: [facility] ENDOSCOPY;  Service: Endoscopy;  Laterality: N/A;   ERCP N/A    ESOPHAGOGASTRODUODENOSCOPY N/A 05/14/2015   Procedure: ESOPHAGOGASTRODUODENOSCOPY (EGD);  Surgeon: Lamar ONEIDA Holmes, MD;  Location: [facility] ENDOSCOPY;  Service: Endoscopy;  Laterality: N/A;   EUS N/A 2011, 2012   EYE SURGERY  2013   GLAUCOMA SURGERY Right 2013   LAPAROSCOPIC RIGHT COLECTOMY Right 06/22/2015   LAPAROSCOPIC RIGHT COLECTOMY;  Surgeon: Reyes LELON Cota, MD;  Location: [facility] ORS; 1.6 cm tubular adenoma.   NECK SURGERY  2006   C3-4 fusion   right breast cancer     SAVORY DILATION N/A 05/14/2015   Procedure: SAVORY DILATION;  Surgeon: Lamar ONEIDA Holmes, MD;  Location: [facility] ENDOSCOPY;  Service: Endoscopy;  Laterality: N/A;   SIMPLE MASTECTOMY WITH AXILLARY SENTINEL NODE BIOPSY Left 05/09/2019   Procedure: LEFT SIMPLE MASTECTOMY WITH SENTINEL LYMPH NODE BIOPSY LEFT - Verified injection at 11:00;  Surgeon: Cota Reyes LELON, MD;  Location: [facility] ORS;  Service: General;  Laterality: Left;   TRABECULECTOMY Left 03/05/2016   Procedure: TRABECULECTOMY WITH [facility] AND [facility] SHUNT;  Surgeon: Dene Etienne, MD;  Location: [facility];  Service: Ophthalmology;  Laterality: Left;   TUBAL LIGATION       reports that she quit smoking about 16 years ago. Her smoking use included cigarettes. She started smoking about 59 years ago. She has a 43 pack-year smoking history. She has never used smokeless tobacco. She reports current alcohol use. She reports that she does not use drugs.  Allergies  Allergen Reactions   Influenza Vaccines Anaphylaxis   Influenza Virus Vaccine Other (See Comments)    Muscle spams   Gabapentin  Anxiety   Sulfa Antibiotics Rash   Tetracyclines & Related Rash    Family History  Problem  Relation Age of Onset   Breast cancer Mother 69   Kidney cancer Neg Hx    Bladder Cancer Neg Hx     Prior to Admission medications   Medication Sig Start Date End Date Taking? Authorizing Provider  albuterol  (PROVENTIL ) (2.5 MG/3ML) 0.083% nebulizer solution Take 3 mLs (2.5 mg total) by nebulization every 6 (six) hours as needed for wheezing or shortness of breath. 09/12/19   Kassie Acquanetta Bradley, MD  ALPRAZolam  (XANAX ) 0.5 MG tablet Take 0.5-1 mg by mouth See admin instructions. Take 1 tablet in the AM and 1/2 tablet at bedtime.    [provider]  AZOPT  1 % ophthalmic suspension Place 1 drop into the left eye daily.  06/26/17   [provider]  benzonatate  (TESSALON ) 100 MG capsule Take 1 capsule (100 mg total) by mouth 3 (three) times daily as needed for cough. 01/02/20   Kassie Acquanetta Bradley, MD  bisacodyl (DULCOLAX) 5 MG EC tablet Take  5 mg by mouth daily as needed for moderate constipation.    [provider]  Brimonidine  Tartrate-Timolol  (COMBIGAN  OP) Apply 1 drop to eye 2 (two) times daily. Both eyes    [provider]  buPROPion  (WELLBUTRIN  SR) 150 MG 12 hr tablet Take 300 mg by mouth daily.     [provider]  dextromethorphan  15 MG/5ML syrup Take 10 mLs by mouth 4 (four) times daily as needed for cough.    [provider]  escitalopram (LEXAPRO) 10 MG tablet Take 15 mg by mouth at bedtime.  05/14/17   [provider]  esomeprazole (NEXIUM) 40 MG capsule Take 40 mg by mouth daily. 04/16/19   [provider]  fluticasone  furoate-vilanterol (BREO ELLIPTA ) 100-25 MCG/INH AEPB Inhale 1 puff into the lungs daily. 09/12/19   Kassie Acquanetta Bradley, MD  ipratropium (ATROVENT ) 0.06 % nasal spray Place 2 sprays into both nostrils 4 (four) times daily as needed for rhinitis.    [provider]  LUMIGAN  0.01 % SOLN Place 1 drop into both eyes at bedtime.  03/06/17   [provider]  metoprolol succinate (TOPROL-XL) 50 MG 24  hr tablet Take 12.5 mg by mouth daily. 06/28/19 06/27/20  [provider]  nicotine  polacrilex (NICORETTE ) 2 MG gum Take 2 mg by mouth as needed for smoking cessation.    [provider]  ondansetron  (ZOFRAN ) 4 MG tablet Take 4 mg by mouth every 6 (six) hours as needed for nausea/vomiting. 03/04/19   [provider]  rosuvastatin  (CRESTOR ) 10 MG tablet Take 10 mg by mouth at bedtime.     [provider]  tamoxifen  (NOLVADEX ) 20 MG tablet Take 1 tablet (20 mg total) by mouth daily. 03/25/19   Dessa Reyes ORN, MD  tamsulosin (FLOMAX) 0.4 MG CAPS capsule Take 0.4 mg by mouth every other day. 09/12/20   [provider]  Tiotropium Bromide  Monohydrate (SPIRIVA  RESPIMAT) 2.5 MCG/ACT AERS Inhale 2 Inhalers into the lungs daily. 09/12/19 12/11/19  Kassie Acquanetta Bradley, MD  Tiotropium Bromide  Monohydrate (SPIRIVA  RESPIMAT) 2.5 MCG/ACT AERS Inhale 2 puffs into the lungs daily. 09/12/19   Kassie Acquanetta Bradley, MD  mirabegron  ER (MYRBETRIQ ) 25 MG TB24 tablet Take 1 tablet (25 mg total) by mouth daily. 05/17/20 05/29/20  Helon Clotilda LABOR, PA-C    Physical Exam: Vitals:   09/09/24 1645 09/09/24 1730 09/09/24 1815 09/09/24 1824  BP: 137/86 (!) 148/53 (!) 172/76   Pulse: 88  81   Resp: 20 18 19    Temp:    98.3 F (36.8 C)  TempSrc:    Oral  SpO2: 100% 98% 99%   Weight:        Physical Exam Vitals reviewed.  Constitutional:      General: She is not in acute distress. HENT:     Head: Normocephalic and atraumatic.  Eyes:     Extraocular Movements: Extraocular movements intact.  Cardiovascular:     Rate and Rhythm: Normal rate and regular rhythm.     Pulses: Normal pulses.  Pulmonary:     Effort: Pulmonary effort is normal. No respiratory distress.     Breath sounds: No wheezing or rales.  Abdominal:     General: Bowel sounds are normal. There is no distension.     Palpations: Abdomen is soft.     Tenderness: There is no abdominal tenderness. There is no guarding.   Musculoskeletal:     Right lower leg: No edema.     Left lower leg: No  edema.  Skin:    General: Skin is warm and dry.  Neurological:     General: No focal deficit present.     Mental Status: She is alert and oriented to person, place, and time.     Cranial Nerves: No cranial nerve deficit.     Sensory: No sensory deficit.     Motor: No weakness.     Labs on Admission: I have personally reviewed following labs and imaging studies  CBC: Recent Labs  Lab 09/09/24 1038  WBC 5.8  HGB 8.2*  HCT 27.5*  MCV 76.6*  PLT 297   Basic Metabolic Panel: Recent Labs  Lab 09/09/24 1038  NA 138  K 4.1  CL 104  CO2 21*  GLUCOSE 96  BUN 21  CREATININE 1.33*  CALCIUM  9.1   GFR: CrCl cannot be calculated (Unknown ideal weight.). Liver Function Tests: Recent Labs  Lab 09/09/24 1038  AST 44*  ALT 47*  ALKPHOS 72  BILITOT 0.4  PROT 7.3  ALBUMIN 3.8   No results for input(s): LIPASE, AMYLASE in the last 168 hours. No results for input(s): AMMONIA in the last 168 hours. Coagulation Profile: No results for input(s): INR, PROTIME in the last 168 hours. Cardiac Enzymes: No results for input(s): CKTOTAL, CKMB, CKMBINDEX, TROPONINI in the last 168 hours. BNP (last 3 results) Recent Labs    09/09/24 1038  PROBNP 2,169.0*   HbA1C: No results for input(s): HGBA1C in the last 72 hours. CBG: No results for input(s): GLUCAP in the last 168 hours. Lipid Profile: No results for input(s): CHOL, HDL, LDLCALC, TRIG, CHOLHDL, LDLDIRECT in the last 72 hours. Thyroid Function Tests: No results for input(s): TSH, T4TOTAL, FREET4, T3FREE, THYROIDAB in the last 72 hours. Anemia Panel: No results for input(s): VITAMINB12, FOLATE, FERRITIN, TIBC, IRON, RETICCTPCT in the last 72 hours. Urine analysis:    Component Value Date/Time   COLORURINE STRAW (A) 05/20/2018 1714   APPEARANCEUR Cloudy (A) 10/09/2020 1412   LABSPEC 1.002 (L)  05/20/2018 1714   PHURINE 6.0 05/20/2018 1714   GLUCOSEU Negative 10/09/2020 1412   HGBUR NEGATIVE 05/20/2018 1714   BILIRUBINUR Negative 10/09/2020 1412   KETONESUR NEGATIVE 05/20/2018 1714   PROTEINUR Negative 10/09/2020 1412   PROTEINUR NEGATIVE 05/20/2018 1714   NITRITE Positive (A) 10/09/2020 1412   NITRITE NEGATIVE 05/20/2018 1714   LEUKOCYTESUR 2+ (A) 10/09/2020 1412    Radiological Exams on Admission: DG Chest 2 View Result Date: 09/09/2024 EXAM: 2 VIEW(S) XRAY OF THE CHEST 09/09/2024 11:38:00 AM COMPARISON: 11/21/2015 CLINICAL HISTORY: Shortness of breath and new cough x 1 week, history of COPD. Per husband, patient has been lethargic since multiple teeth extraction 2 days ago. No fever. FINDINGS: LUNGS AND PLEURA: Bilateral hyperaerated lungs. Patchy opacity in the right mid lung may represent developing infection. Dense, nodular opacity along the left lower lung on the frontal view without definite correlate on the lateral may represent dystrophic calcification in the setting of prior mastectomy. No pleural effusion. No pneumothorax. HEART AND MEDIASTINUM: Aortic calcifications. No acute abnormality of the cardiac and mediastinal silhouettes. BONES AND SOFT TISSUES: No acute osseous abnormality. IMPRESSION: 1. Patchy right mid-lung opacity, which may represent developing infection. Follow-up imaging is recommended to ensure resolution. 2. Dense nodular opacity along the left lower lung on the frontal view without definite lateral correlate, which may represent dystrophic calcification in the setting of prior mastectomy. Electronically signed by: Ryan Chess MD 09/09/2024 12:23 PM EDT RP Workstation: [id number]    Assessment and Plan  COVID-19 virus infection with pneumonia No fever or leukocytosis.  SARS-CoV-2 PCR positive.  Chest x-ray showing patchy right midlung opacity concerning for pneumonia.  Start remdesivir .  No indication for steroids at this time as patient is not  hypoxic.  She was given antibiotics in the ED.  Bacterial infection less likely, check procalcitonin level.  Airborne and contact precautions.  Acute CHF No documented history of CHF.  Last echo done over 5 years ago ([facility]) was showing normal EF, mild AR, moderate MR, mild TR, and trivial PR.  proBNP 2169, however, patient does not appear overtly volume overloaded on exam.  Chest x-ray not suggestive of pulmonary edema.  PE is on the differential given COVID infection and elevated proBNP, however, less likely given no tachycardia, hypoxia, or clinical signs of DVT on exam.  SBP in the 140s.  IV Lasix  20 mg x 1 given.  Repeat echocardiogram ordered.  Monitor intake and output, daily weights, renal function and electrolytes. Check D-dimer level, and if positive, start heparin  drip for anticoagulation and obtain VQ scan in the morning to rule out PE.  Best to avoid giving IV contrast for CTA due to GFR <45.  Elevated troponin EKG showing ST depressions and T wave inversions inferolaterally which appear new compared to previous EKG in the chart from March 2020.  ACS less likely as patient is reporting generalized chest pain/discomfort only when coughing but not otherwise.  Currently chest pain-free and resting comfortably.  Troponin elevated but stable (74> 61).  Suspect troponin elevation is due to demand ischemia in the setting of COVID-pneumonia and acute CHF.  Echocardiogram ordered as above.  Mild transaminitis Likely related to COVID infection and acute CHF.  Hold home statin at this time and monitor LFTs.  Generalized weakness PT/OT eval, fall precautions.  COPD Stable, no signs of acute exacerbation.  Continue home inhalers.  Chronic microcytic anemia Hemoglobin at baseline and no signs of bleeding.  Monitor labs.  CKD stage 3a Creatinine close to baseline, monitor labs.  Hyperlipidemia Hold home statin at this time and monitor LFTs.  Prediabetes A1c 5.9 in February 2024,  repeat ordered.  Bipolar disorder/anxiety Continue home bupropion , quetiapine , and alprazolam  PRN.  Chronic constipation Continue home Linzess .  GERD Continue PPI.  DVT prophylaxis: Lovenox  Code Status: DNR/DNI (discussed with the patient) Family Communication: No family available at this time.  Diagnostic findings and treatment plan discussed with the patient. Level of care: Telemetry bed Admission status: It is my clinical opinion that admission to INPATIENT is reasonable and necessary because of the expectation that this patient will require hospital care that crosses at least 2 midnights to treat this condition based on the medical complexity of the problems presented.  Given the aforementioned information, the predictability of an adverse outcome is felt to be significant.   Editha Ram MD [facility]  If 7PM-7AM, please contact night-coverage [email or web address]  09/09/2024, 7:36 PM

## 2024-09-10 ENCOUNTER — Inpatient Hospital Stay (HOSPITAL_COMMUNITY)

## 2024-09-10 ENCOUNTER — Encounter (HOSPITAL_COMMUNITY): Payer: Self-pay | Admitting: Hospitalist

## 2024-09-10 DIAGNOSIS — F319 Bipolar disorder, unspecified: Secondary | ICD-10-CM

## 2024-09-10 DIAGNOSIS — C50912 Malignant neoplasm of unspecified site of left female breast: Secondary | ICD-10-CM

## 2024-09-10 DIAGNOSIS — I159 Secondary hypertension, unspecified: Secondary | ICD-10-CM

## 2024-09-10 DIAGNOSIS — U071 COVID-19: Principal | ICD-10-CM

## 2024-09-10 DIAGNOSIS — I509 Heart failure, unspecified: Secondary | ICD-10-CM | POA: Diagnosis not present

## 2024-09-10 DIAGNOSIS — J441 Chronic obstructive pulmonary disease with (acute) exacerbation: Secondary | ICD-10-CM

## 2024-09-10 LAB — COMPREHENSIVE METABOLIC PANEL WITH GFR
ALT: 32 U/L (ref 0–44)
AST: 28 U/L (ref 15–41)
Albumin: 2.6 g/dL — ABNORMAL LOW (ref 3.5–5.0)
Alkaline Phosphatase: 60 U/L (ref 38–126)
Anion gap: 12 (ref 5–15)
BUN: 18 mg/dL (ref 8–23)
CO2: 21 mmol/L — ABNORMAL LOW (ref 22–32)
Calcium: 8.3 mg/dL — ABNORMAL LOW (ref 8.9–10.3)
Chloride: 105 mmol/L (ref 98–111)
Creatinine, Ser: 1.36 mg/dL — ABNORMAL HIGH (ref 0.44–1.00)
GFR, Estimated: 40 mL/min — ABNORMAL LOW (ref >60)
Glucose, Bld: 116 mg/dL — ABNORMAL HIGH (ref 70–99)
Potassium: 3.6 mmol/L (ref 3.5–5.1)
Sodium: 138 mmol/L (ref 135–145)
Total Bilirubin: 0.5 mg/dL (ref 0.0–1.2)
Total Protein: 6.1 g/dL — ABNORMAL LOW (ref 6.5–8.1)

## 2024-09-10 LAB — CBC
HCT: 25.3 % — ABNORMAL LOW (ref 36.0–46.0)
Hemoglobin: 7.5 g/dL — ABNORMAL LOW (ref 12.0–15.0)
MCH: 22.5 pg — ABNORMAL LOW (ref 26.0–34.0)
MCHC: 29.6 g/dL — ABNORMAL LOW (ref 30.0–36.0)
MCV: 76 fL — ABNORMAL LOW (ref 80.0–100.0)
Platelets: 294 K/uL (ref 150–400)
RBC: 3.33 MIL/uL — ABNORMAL LOW (ref 3.87–5.11)
RDW: 22.5 % — ABNORMAL HIGH (ref 11.5–15.5)
WBC: 4.4 K/uL (ref 4.0–10.5)
nRBC: 0 % (ref 0.0–0.2)

## 2024-09-10 LAB — ECHOCARDIOGRAM COMPLETE
Area-P 1/2: 4.04 cm2
Height: 65 in
S' Lateral: 1.8 cm
Weight: 1456.8 [oz_av]

## 2024-09-10 LAB — HEPARIN LEVEL (UNFRACTIONATED): Heparin Unfractionated: 0.67 [IU]/mL (ref 0.30–0.70)

## 2024-09-10 LAB — HEMOGLOBIN A1C
Hgb A1c MFr Bld: 5.2 % (ref 4.8–5.6)
Mean Plasma Glucose: 102.54 mg/dL

## 2024-09-10 LAB — PROCALCITONIN: Procalcitonin: 0.17 ng/mL

## 2024-09-10 LAB — D-DIMER, QUANTITATIVE: D-Dimer, Quant: 4.61 ug{FEU}/mL — ABNORMAL HIGH (ref 0.00–0.50)

## 2024-09-10 MED ORDER — IOHEXOL 350 MG/ML SOLN
75.0000 mL | Freq: Once | INTRAVENOUS | Status: AC | PRN
  Administered 2024-09-10: 75 mL via INTRAVENOUS

## 2024-09-10 MED ORDER — PREDNISONE 20 MG PO TABS
40.0000 mg | ORAL_TABLET | Freq: Every day | ORAL | Status: DC
  Administered 2024-09-10: 40 mg via ORAL
  Filled 2024-09-10: qty 2

## 2024-09-10 MED ORDER — TECHNETIUM TO 99M ALBUMIN AGGREGATED
4.4000 | Freq: Once | INTRAVENOUS | Status: AC
  Administered 2024-09-10: 4.4 via INTRAVENOUS

## 2024-09-10 MED ORDER — ACETAMINOPHEN 325 MG PO TABS
650.0000 mg | ORAL_TABLET | Freq: Four times a day (QID) | ORAL | Status: DC | PRN
  Administered 2024-09-10: 650 mg via ORAL
  Filled 2024-09-10: qty 2

## 2024-09-10 MED ORDER — HEPARIN (PORCINE) 25000 UT/250ML-% IV SOLN
650.0000 [IU]/h | INTRAVENOUS | Status: DC
  Administered 2024-09-10: 700 [IU]/h via INTRAVENOUS
  Filled 2024-09-10: qty 250

## 2024-09-10 NOTE — Plan of Care (Signed)

## 2024-09-10 NOTE — Progress Notes (Signed)
 PHARMACY - ANTICOAGULATION CONSULT NOTE  Pharmacy Consult for Heparin  Indication: possible PE  Allergies  Allergen Reactions   Influenza Vaccines Anaphylaxis   Influenza Virus Vaccine Other (See Comments)    Muscle spams   Gabapentin  Anxiety   Sulfa Antibiotics Rash   Tetracyclines & Related Rash    Patient Measurements: Height: 5' 5 (165.1 cm) Weight: 41.3 kg (91 lb 0.8 oz) IBW/kg (Calculated) : 57 HEPARIN  DW (KG): 41.5  Vital Signs: Temp: 98 F (36.7 C) (09/20 0700) Temp Source: Oral (09/20 0700) BP: 139/71 (09/20 0700) Pulse Rate: 76 (09/20 0700)  Labs: Recent Labs    09/09/24 1038 09/10/24 0244 09/10/24 1300  HGB 8.2* 7.5*  --   HCT 27.5* 25.3*  --   PLT 297 294  --   HEPARINUNFRC  --   --  0.67  CREATININE 1.33* 1.36*  --     Estimated Creatinine Clearance: 22.9 mL/min (A) (by C-G formula based on SCr of 1.36 mg/dL (H)).   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  ER: 90%; PR: Neg, Her 2 neu not overexpressed.    Broken foot 2015   right foot   Chronic gastritis    Chronic kidney disease    Dr Marcelino   COPD (chronic obstructive pulmonary disease) (HCC)    Dizziness    inner ear (per pt) several times per week   Dysphagia    Falls frequently    Glaucoma    Hyperlipemia    Hypertension    pt has recently come off BP meds.  MD aware. BP seems stable.   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    Memory difficulty    Neck stiffness    s/p C3-C4 fusion, limited right turn   Osteoarthritis    back   Pancreatitis    Parkinson  disease (HCC)    Pituitary microadenoma (HCC)    Recurrent UTI    Renal insufficiency    Stomach ulcer 2112    Medications:  Medications Prior to Admission  Medication Sig Dispense Refill Last Dose/Taking   albuterol  (PROVENTIL ) (2.5 MG/3ML) 0.083% nebulizer solution Take 3 mLs (2.5 mg total) by nebulization every 6 (six) hours as needed for wheezing or shortness of breath. 360 mL 6 Past Week   ALPRAZolam  (XANAX ) 0.5 MG tablet Take 0.5 mg by mouth 2 (two) times daily as needed for anxiety or sleep.   09/08/2024 Bedtime   amoxicillin-clavulanate (AUGMENTIN) 600-42.9 MG/5ML suspension Take 5 mLs by mouth 3 (three) times daily.   Past Week   benzonatate  (TESSALON ) 100 MG capsule Take 1 capsule (100 mg total) by mouth 3 (three) times daily as needed for cough. (Patient taking differently: Take 200 mg by mouth 3 (three) times daily as needed for cough.) 90 capsule 0 Past Week   bisacodyl (DULCOLAX) 5 MG EC tablet Take 5 mg by mouth daily as needed for moderate constipation.   Past Week   Brimonidine  Tartrate-Timolol  (COMBIGAN  OP) Place 1 drop into both eyes 2 (two) times daily.   09/08/2024 Morning   buPROPion  (WELLBUTRIN  SR)  150 MG 12 hr tablet Take 300 mg by mouth daily.    09/08/2024 Morning   dextromethorphan  15 MG/5ML syrup Take 10 mLs by mouth 4 (four) times daily as needed for cough.   Past Week   diphenhydramine-acetaminophen  (TYLENOL  PM) 25-500 MG TABS tablet Take 1 tablet by mouth at bedtime as needed (for pain,sleep).   Past Week   esomeprazole (NEXIUM) 40 MG capsule Take 40 mg by mouth daily.   09/08/2024 Morning   ferrous gluconate (FERGON) 324 MG tablet Take 324 mg by mouth daily with breakfast.   09/08/2024 Morning   fluticasone  furoate-vilanterol (BREO ELLIPTA ) 100-25 MCG/INH AEPB Inhale 1 puff into the lungs daily. 1 each 5 09/08/2024 Morning   ipratropium (ATROVENT ) 0.06 % nasal spray Place 2 sprays into both nostrils 4 (four) times daily as needed for rhinitis.   Past Week    linaclotide  (LINZESS ) 145 MCG CAPS capsule Take 145 mcg by mouth daily before breakfast.   09/08/2024 Morning   LUMIGAN  0.01 % SOLN Place 1 drop into both eyes at bedtime.   0 09/08/2024 Bedtime   nitrofurantoin (MACRODANTIN) 100 MG capsule Take 100 mg by mouth daily.   09/08/2024 Morning   nitrofurantoin, macrocrystal-monohydrate, (MACROBID) 100 MG capsule Take 100 mg by mouth daily.   09/08/2024 Morning   ondansetron  (ZOFRAN ) 4 MG tablet Take 4 mg by mouth every 6 (six) hours as needed for nausea/vomiting.   09/08/2024 Morning   QUEtiapine  (SEROQUEL ) 50 MG tablet Take 50 mg by mouth at bedtime.   09/08/2024 Bedtime   rosuvastatin  (CRESTOR ) 10 MG tablet Take 10 mg by mouth at bedtime.    09/08/2024 Bedtime   Tiotropium Bromide  Monohydrate (SPIRIVA  RESPIMAT) 2.5 MCG/ACT AERS Inhale 2 puffs into the lungs daily. 4 g 0 09/08/2024 Morning   metoprolol succinate (TOPROL-XL) 50 MG 24 hr tablet Take 12.5 mg by mouth daily.      Tiotropium Bromide  Monohydrate (SPIRIVA  RESPIMAT) 2.5 MCG/ACT AERS Inhale 2 Inhalers into the lungs daily. 4 g 6     Assessment: 78 y.o. female with SOB and elevated D-Dimer, possible PE, for heparin .  Received Lovenox  30 mg at 11 pm  9/20 - initial HL 0.67 on upper end of therapeutic range.  Hgb 8.2 > 7.5, pltc 294 stable.  No overt s/sx bleeding per RN, but she did remove a PIV earlier due to oozing.  PE workup still pending - VQ scan and CTA ordered.  O2 sats >98% on RA.  Given Hgb drop, will target mid range until PE is confirmed.  Goal of Therapy:  Heparin  level 0.3-0.7 units/ml Monitor platelets by anticoagulation protocol: Yes   Plan:  Decrease heparin  IV slightly to 650 units//hr 8h heparin  level F/U VQ scan vs CTA Monitor s/sx bleeding closely   Maurilio Fila, PharmD Clinical Pharmacist 09/10/2024  2:12 PM

## 2024-09-10 NOTE — Evaluation (Signed)
 Occupational Therapy Evaluation Patient Details Name: Elaine Clayton MRN: [id number] DOB: 1946/04/28 Today's Date: 09/10/2024   History of Present Illness   Pt is a 78 y.o. female admitted 9/19 for SOB. Covid positive, Chest x-ray concerning for pneumonia. PMH: COPD, UTIs, pancreatitis, breast cancer, osteoarthritis, parkinson's disease, bipolar, CKD, HTN, HLD, hyperthyroidism     Clinical Impressions Pt admitted based on above, and was seen based on problem list below. PTA pt was independent with ADLs and IADLs. Today pt is requiring set up  to min  for most ADLs. Pt incontinent of stool and was total assist for hygiene in standing. Functional transfers are up to mod assist for balance and assist with RW. Pt with poor problem solving and safety, requiring assist to navigate RW in room. Based on today's performance pt would benefit from [facility] upon d/c to return to functional baseline. OT will continue to follow acutely to maximize functional independence.        If plan is discharge home, recommend the following:   A little help with walking and/or transfers;A little help with bathing/dressing/bathroom;Assistance with cooking/housework     Functional Status Assessment   Patient has had a recent decline in their functional status and demonstrates the ability to make significant improvements in function in a reasonable and predictable amount of time.     Equipment Recommendations   Other (comment) (RW)      Precautions/Restrictions   Precautions Precautions: Fall Recall of Precautions/Restrictions: Intact     Mobility Bed Mobility Overal bed mobility: Modified Independent    Transfers Overall transfer level: Needs assistance Equipment used: Rolling walker (2 wheels) Transfers: Sit to/from Stand, Bed to chair/wheelchair/BSC Sit to Stand: Contact guard assist     Step pivot transfers: Min assist, Mod assist     General transfer comment: CGA for balance to stand,  assist with balance and managing RW with transfer      Balance Overall balance assessment: Needs assistance Sitting-balance support: Bilateral upper extremity supported, Feet supported Sitting balance-Leahy Scale: Fair   Postural control: Posterior lean Standing balance support: Bilateral upper extremity supported, During functional activity Standing balance-Leahy Scale: Poor Standing balance comment: Reliant on RW         ADL either performed or assessed with clinical judgement   ADL Overall ADL's : Needs assistance/impaired Eating/Feeding: Set up;Sitting   Grooming: Set up;Sitting           Upper Body Dressing : Set up;Sitting   Lower Body Dressing: Minimal assistance;Sit to/from stand   Toilet Transfer: Cueing for safety;Moderate assistance;Rollator (4 wheels);BSC/3in1 Toilet Transfer Details (indicate cue type and reason): Assist with balance and managing RW Toileting- Clothing Manipulation and Hygiene: Total assistance;Sit to/from stand Toileting - Clothing Manipulation Details (indicate cue type and reason): Pt incontinent of stool, total for hygiene in standing     Functional mobility during ADLs: Minimal assistance;Moderate assistance;Rolling walker (2 wheels) General ADL Comments: Pt with poor balance, incontient, poor problem solving during functional tasks     Vision Baseline Vision/History: 1 Wears glasses Patient Visual Report: No change from baseline Vision Assessment?: No apparent visual deficits            Pertinent Vitals/Pain Pain Assessment Pain Assessment: No/denies pain     Extremity/Trunk Assessment Upper Extremity Assessment Upper Extremity Assessment: Generalized weakness   Lower Extremity Assessment Lower Extremity Assessment: Defer to PT evaluation       Communication Communication Communication: No apparent difficulties   Cognition Arousal: Alert Behavior During Therapy: Agitated  Cognition: Cognition impaired      Awareness: Online awareness impaired     Executive functioning impairment (select all impairments): Problem solving OT - Cognition Comments: Pt with poor situational awareness, unaware of stool, poor problem solving with RW     Following commands: Intact       Cueing  General Comments   Cueing Techniques: Verbal cues  Unable to measure O2 sensor not obtaining pleth, Pt did not appear in distress           Home Living Family/patient expects to be discharged to:: Private residence Living Arrangements: Spouse/significant other Available Help at Discharge: Family Type of Home: House Home Access: Level entry     Home Layout: One level     Bathroom Shower/Tub: Tub/shower unit;Walk-in shower   Bathroom Toilet: Handicapped height Bathroom Accessibility: Yes How Accessible: Accessible via walker Home Equipment: Cane - single point;Wheelchair - manual;Shower seat;Grab bars - tub/shower;Grab bars - toilet;Rollator (4 wheels)          Prior Functioning/Environment Prior Level of Function : Independent/Modified Independent       Mobility Comments: Use of rollator at home ADLs Comments: No assist    OT Problem List: Decreased strength;Decreased activity tolerance;Impaired balance (sitting and/or standing);Cardiopulmonary status limiting activity;Decreased safety awareness;Decreased knowledge of use of DME or AE   OT Treatment/Interventions: Self-care/ADL training;Therapeutic exercise;Energy conservation;DME and/or AE instruction;Therapeutic activities;Patient/family education;Balance training      OT Goals(Current goals can be found in the care plan section)   Acute Rehab OT Goals Patient Stated Goal: To go home OT Goal Formulation: With patient Time For Goal Achievement: 09/24/24 Potential to Achieve Goals: Good   OT Frequency:  Min 2X/week       AM-PAC OT 6 Clicks Daily Activity     Outcome Measure Help from another person eating meals?: None Help from  another person taking care of personal grooming?: A Little Help from another person toileting, which includes using toliet, bedpan, or urinal?: Total Help from another person bathing (including washing, rinsing, drying)?: A Little Help from another person to put on and taking off regular upper body clothing?: A Little Help from another person to put on and taking off regular lower body clothing?: A Little 6 Click Score: 17   End of Session Equipment Utilized During Treatment: Gait belt;Rolling walker (2 wheels) Nurse Communication: Mobility status  Activity Tolerance: Patient tolerated treatment well Patient left: in bed;with call bell/phone within reach  OT Visit Diagnosis: Unsteadiness on feet (R26.81);Other abnormalities of gait and mobility (R26.89);Muscle weakness (generalized) (M62.81)                Time: 8850-8776 OT Time Calculation (min): 34 min Charges:  OT General Charges $OT Visit: 1 Visit OT Evaluation $OT Eval Moderate Complexity: 1 Mod OT Treatments $Self Care/Home Management : 8-22 mins  Adrianne BROCKS, OT  Acute Rehabilitation Services Office [phone] Secure chat preferred   Adrianne GORMAN Savers 09/10/2024, 1:49 PM

## 2024-09-10 NOTE — Progress Notes (Signed)
 Progress Note   Patient: Elaine Clayton FMW:[id number] DOB: 1946/09/09 DOA: 09/09/2024     1 DOS: the patient was seen and examined on 09/10/2024   Brief hospital course: Elaine Clayton was admitted to the hospital with the working diagnosis of SARS COVID 19 viral pneumonia, complicated with heart failure decompensation.   78 yo female with the past medical history of Parkinson's disease, bipolar disorder, COPD, CKD, hypertension, hyperlipidemia, breast cancer and chronic pancreatitis who presented with dyspnea.  Reported worsening dyspnea for the last 2 to 3 weeks prior to admission. 2 days before admission she had teeth extraction and since then she had poor oral intake and lethargy.  On her initial physical examination her blood pressure was 137/86, HR 88, RR 19 and 02 saturation 99% Lungs with no wheezing or rhonchi, heart with S1 and S2 present and regular, abdomen with no distention and no lower extremity edema.  Na 138 K 4.1 Cl 014 bicarbonate 21, glucose 96 bun 21 cr 1.33 AST 44 ALT 47  BNP 2169  High sensitive troponin 74 and 61  Wbc 5.8 hgb 8,2 plt 297  D dimer 4.61  Influenza negative RSV negative SARS covid 19 positive.   EKG 82 bpm, normal axis, normal intervals, qtc 452, sinus rhythm with no significant ST segment changes, negative T wave lead II, III, aVF, V4 and V5, flat in V6. Positive LVH.     Assessment and Plan: * COPD with acute exacerbation (HCC) Continue bronchodilator therapy, LABA, LAMA and ICS Add systemic corticosteroids with prednisone  40 mg po daily. Airway clearing techniques with flutter valve and incentive spirometer.  Continue 02 monitoring, and supplemental 02 to keep 02 saturation 88% or greater. Out of bed to chair TID, PT and OT.   COVID-19 virus infection Patient has been placed on Remdesivir , that will be continued per protocol.  Elevated D dimer, check CRP and will add systemic corticosteroids.  No clinical signs of bacterial over infection.    HTN (hypertension) Patient not on antihypertensive agents.  Continue blood pressure monitoring.   On admission elevated D dimer, high sensitive troponin and BNP. Follow up on echocardiogram.  Clinically with no signs of volume overload.  Will continue IV heparin  for now and will get CT chest angiography to rule out DVT.  Noted no lower extremity edema.   Bipolar disorder (HCC) Continue with bupropion , and quetiapine .   Invasive lobular carcinoma of left breast, stage 1 (HCC) Follow up as outpatient.  Consult nutrition.      Subjective: patient is feeling better, continue to have cough, no chest pain, she has been very weak and deconditioned, poor oral intake.   Physical Exam: Vitals:   09/10/24 0500 09/10/24 0600 09/10/24 0656 09/10/24 0700  BP:    139/71  Pulse: 73 76  76  Resp:    18  Temp:    98 F (36.7 C)  TempSrc:    Oral  SpO2: 98% 100%    Weight:   41.3 kg   Height:       Neurology awake and alert ENT with mild pallor Cardiovascular wit S1 and S2 present and regular with no gallops or rubs, no murmurs Respiratory with decreased breath sounds and mild rales at bases with no wheezing or rhonchi,  Abdomen with no distention  No lower extremity edema   Data Reviewed:    Family Communication: I spoke with patient's husband at the bedside, we talked in detail about patient's condition, plan of care and prognosis and  all questions were addressed.   Disposition: Status is: Inpatient Remains inpatient appropriate because: copd exacerbation   Planned Discharge Destination: Home     Author: Elidia Toribio Furnace, MD 09/10/2024 1:10 PM  For on call review [email or web address].

## 2024-09-10 NOTE — Assessment & Plan Note (Signed)
 Patient not on antihypertensive agents.  Continue blood pressure monitoring.   On admission elevated D dimer, high sensitive troponin and BNP. Follow up on echocardiogram.  Clinically with no signs of volume overload.  Will continue IV heparin  for now and will get CT chest angiography to rule out DVT.  Noted no lower extremity edema.

## 2024-09-10 NOTE — Progress Notes (Addendum)
 PHARMACY - ANTICOAGULATION CONSULT NOTE  Pharmacy Consult for Heparin  Indication: possible PE  Allergies  Allergen Reactions   Influenza Vaccines Anaphylaxis   Influenza Virus Vaccine Other (See Comments)    Muscle spams   Gabapentin  Anxiety   Sulfa Antibiotics Rash   Tetracyclines & Related Rash    Patient Measurements: Height: 5' 5 (165.1 cm) Weight: 41.5 kg (91 lb 6.4 oz) IBW/kg (Calculated) : 57 HEPARIN  DW (KG): 41.5  Vital Signs: Temp: 97.9 F (36.6 C) (09/19 2337) Temp Source: Oral (09/19 2337) BP: 185/85 (09/19 2337) Pulse Rate: 84 (09/19 2337)  Labs: Recent Labs    09/09/24 1038  HGB 8.2*  HCT 27.5*  PLT 297  CREATININE 1.33*    Estimated Creatinine Clearance: 23.6 mL/min (A) (by C-G formula based on SCr of 1.33 mg/dL (H)).   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  ER: 90%; PR: Neg, Her 2 neu not overexpressed.    Broken foot 2015   right foot   Chronic gastritis    Chronic kidney disease    Dr Marcelino   COPD (chronic obstructive pulmonary disease) (HCC)    Dizziness    inner ear (per pt) several times per week   Dysphagia    Falls frequently    Glaucoma    Hyperlipemia    Hypertension    pt has recently come off BP meds.  MD aware. BP seems stable.   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    Memory difficulty    Neck stiffness    s/p C3-C4 fusion, limited right turn   Osteoarthritis    back   Pancreatitis    Parkinson disease (HCC)    Pituitary microadenoma (HCC)    Recurrent UTI    Renal insufficiency    Stomach  ulcer 2112    Medications:  Medications Prior to Admission  Medication Sig Dispense Refill Last Dose/Taking   albuterol  (PROVENTIL ) (2.5 MG/3ML) 0.083% nebulizer solution Take 3 mLs (2.5 mg total) by nebulization every 6 (six) hours as needed for wheezing or shortness of breath. 360 mL 6 Past Week   ALPRAZolam  (XANAX ) 0.5 MG tablet Take 0.5 mg by mouth 2 (two) times daily as needed for anxiety or sleep.   09/08/2024 Bedtime   amoxicillin-clavulanate (AUGMENTIN) 600-42.9 MG/5ML suspension Take 5 mLs by mouth 3 (three) times daily.   Past Week   benzonatate  (TESSALON ) 100 MG capsule Take 1 capsule (100 mg total) by mouth 3 (three) times daily as needed for cough. (Patient taking differently: Take 200 mg by mouth 3 (three) times daily as needed for cough.) 90 capsule 0 Past Week   bisacodyl (DULCOLAX) 5 MG EC tablet Take 5 mg by mouth daily as needed for moderate constipation.   Past Week   Brimonidine  Tartrate-Timolol  (COMBIGAN  OP) Place 1 drop into both eyes 2 (two) times daily.   09/08/2024 Morning   buPROPion  (WELLBUTRIN  SR) 150 MG 12 hr tablet Take 300 mg by mouth daily.    09/08/2024 Morning   dextromethorphan  15 MG/5ML syrup Take 10 mLs by mouth 4 (four)  times daily as needed for cough.   Past Week   diphenhydramine-acetaminophen  (TYLENOL  PM) 25-500 MG TABS tablet Take 1 tablet by mouth at bedtime as needed (for pain,sleep).   Past Week   esomeprazole (NEXIUM) 40 MG capsule Take 40 mg by mouth daily.   09/08/2024 Morning   ferrous gluconate (FERGON) 324 MG tablet Take 324 mg by mouth daily with breakfast.   09/08/2024 Morning   fluticasone  furoate-vilanterol (BREO ELLIPTA ) 100-25 MCG/INH AEPB Inhale 1 puff into the lungs daily. 1 each 5 09/08/2024 Morning   ipratropium (ATROVENT ) 0.06 % nasal spray Place 2 sprays into both nostrils 4 (four) times daily as needed for rhinitis.   Past Week   linaclotide  (LINZESS ) 145 MCG CAPS capsule Take 145 mcg by mouth daily before breakfast.   09/08/2024 Morning    LUMIGAN  0.01 % SOLN Place 1 drop into both eyes at bedtime.   0 09/08/2024 Bedtime   nitrofurantoin (MACRODANTIN) 100 MG capsule Take 100 mg by mouth daily.   09/08/2024 Morning   nitrofurantoin, macrocrystal-monohydrate, (MACROBID) 100 MG capsule Take 100 mg by mouth daily.   09/08/2024 Morning   ondansetron  (ZOFRAN ) 4 MG tablet Take 4 mg by mouth every 6 (six) hours as needed for nausea/vomiting.   09/08/2024 Morning   QUEtiapine  (SEROQUEL ) 50 MG tablet Take 50 mg by mouth at bedtime.   09/08/2024 Bedtime   rosuvastatin  (CRESTOR ) 10 MG tablet Take 10 mg by mouth at bedtime.    09/08/2024 Bedtime   Tiotropium Bromide  Monohydrate (SPIRIVA  RESPIMAT) 2.5 MCG/ACT AERS Inhale 2 puffs into the lungs daily. 4 g 0 09/08/2024 Morning   metoprolol succinate (TOPROL-XL) 50 MG 24 hr tablet Take 12.5 mg by mouth daily.      Tiotropium Bromide  Monohydrate (SPIRIVA  RESPIMAT) 2.5 MCG/ACT AERS Inhale 2 Inhalers into the lungs daily. 4 g 6     Assessment: 78 y.o. female with SOB and elevated D-Dimer, possible PE, for heparin .  Received Lovenox  30 mg at 11 pm  Goal of Therapy:  Heparin  level 0.3-0.7 units/ml Monitor platelets by anticoagulation protocol: Yes   Plan:  Start heparin  700 units/hr Check heparin  level in 8 hours.  F/U VQ scan  Dail Elaine Clayton 09/10/2024,3:14 AM

## 2024-09-10 NOTE — Hospital Course (Signed)
 Mrs. Templeman was admitted to the hospital with the working diagnosis of SARS COVID 19 viral pneumonia, complicated with heart failure decompensation.   78 yo female with the past medical history of Parkinson's disease, bipolar disorder, COPD, CKD, hypertension, hyperlipidemia, breast cancer and chronic pancreatitis who presented with dyspnea.  Reported worsening dyspnea for the last 2 to 3 weeks prior to admission. 2 days before admission she had teeth extraction and since then she had poor oral intake and lethargy.  On her initial physical examination her blood pressure was 137/86, HR 88, RR 19 and 02 saturation 99% Lungs with no wheezing or rhonchi, heart with S1 and S2 present and regular, abdomen with no distention and no lower extremity edema.  Na 138 K 4.1 Cl 014 bicarbonate 21, glucose 96 bun 21 cr 1.33 AST 44 ALT 47  BNP 2169  High sensitive troponin 74 and 61  Wbc 5.8 hgb 8,2 plt 297  D dimer 4.61  Influenza negative RSV negative SARS covid 19 positive.   EKG 82 bpm, normal axis, normal intervals, qtc 452, sinus rhythm with no significant ST segment changes, negative T wave lead II, III, aVF, V4 and V5, flat in V6. Positive LVH.    Patient placed on systemic and inhaled steroids, bronchodilators, and airway clearing techniques.  CT chest positive for pulmonary embolism.  09/21 transitioned from IV heparin  to oral apixaban .  Follow up with primary care in 7 to 10 days.

## 2024-09-10 NOTE — Assessment & Plan Note (Signed)
 Continue bronchodilator therapy, LABA, LAMA and ICS Continue with systemic corticosteroids with prednisone  40 mg po daily to complete 5 days.  Airway clearing techniques with flutter valve and incentive spirometer.  He 02 saturation is 98% on room air today.  Home health services.

## 2024-09-10 NOTE — Assessment & Plan Note (Signed)
 Continue with bupropion , and quetiapine .

## 2024-09-10 NOTE — Assessment & Plan Note (Signed)
 Follow up as outpatient.  Consult nutrition.

## 2024-09-10 NOTE — Progress Notes (Signed)
 Pharmacy informed of continued bleeding at IV site which was d/ced.  Heparin  placed on the other arm.

## 2024-09-10 NOTE — Assessment & Plan Note (Signed)
 Patient has been placed on Remdesivir , that will be continued per protocol.  Elevated D dimer, check CRP and will add systemic corticosteroids.  No clinical signs of bacterial over infection.

## 2024-09-10 NOTE — Progress Notes (Addendum)
 PT Cancellation Note  Patient Details Name: Elaine Clayton MRN: [id number] DOB: 1946-12-21   Cancelled Treatment:    Reason Eval/Treat Not Completed: Patient declined, reports she walked to bathroom earlier and wants to rest. Will try again tomorrow.   Rodgers ORN [facility] 09/10/2024, 2:37 PM Rodgers Opal PT Acute [employer] [phone]

## 2024-09-11 ENCOUNTER — Other Ambulatory Visit (HOSPITAL_COMMUNITY): Payer: Self-pay

## 2024-09-11 DIAGNOSIS — U071 COVID-19: Secondary | ICD-10-CM | POA: Diagnosis not present

## 2024-09-11 DIAGNOSIS — J441 Chronic obstructive pulmonary disease with (acute) exacerbation: Secondary | ICD-10-CM | POA: Diagnosis not present

## 2024-09-11 DIAGNOSIS — N1831 Chronic kidney disease, stage 3a: Secondary | ICD-10-CM

## 2024-09-11 DIAGNOSIS — I2699 Other pulmonary embolism without acute cor pulmonale: Secondary | ICD-10-CM

## 2024-09-11 DIAGNOSIS — I159 Secondary hypertension, unspecified: Secondary | ICD-10-CM | POA: Diagnosis not present

## 2024-09-11 LAB — C-REACTIVE PROTEIN: CRP: 3.5 mg/dL — ABNORMAL HIGH (ref <1.0)

## 2024-09-11 LAB — BASIC METABOLIC PANEL WITH GFR
Anion gap: 11 (ref 5–15)
BUN: 26 mg/dL — ABNORMAL HIGH (ref 8–23)
CO2: 16 mmol/L — ABNORMAL LOW (ref 22–32)
Calcium: 8.6 mg/dL — ABNORMAL LOW (ref 8.9–10.3)
Chloride: 109 mmol/L (ref 98–111)
Creatinine, Ser: 1.39 mg/dL — ABNORMAL HIGH (ref 0.44–1.00)
GFR, Estimated: 39 mL/min — ABNORMAL LOW (ref >60)
Glucose, Bld: 119 mg/dL — ABNORMAL HIGH (ref 70–99)
Potassium: 4.4 mmol/L (ref 3.5–5.1)
Sodium: 136 mmol/L (ref 135–145)

## 2024-09-11 LAB — HEPARIN LEVEL (UNFRACTIONATED)
Heparin Unfractionated: 0.46 [IU]/mL (ref 0.30–0.70)
Heparin Unfractionated: 0.52 [IU]/mL (ref 0.30–0.70)

## 2024-09-11 LAB — MAGNESIUM: Magnesium: 1.9 mg/dL (ref 1.7–2.4)

## 2024-09-11 MED ORDER — ACETAMINOPHEN 325 MG PO TABS: 650.0000 mg | ORAL_TABLET | Freq: Four times a day (QID) | ORAL | Status: AC | PRN

## 2024-09-11 MED ORDER — AZITHROMYCIN 250 MG PO TABS
500.0000 mg | ORAL_TABLET | Freq: Every day | ORAL | Status: DC
Start: 2024-09-11 — End: 2024-09-11

## 2024-09-11 MED ORDER — APIXABAN 5 MG PO TABS: 5.0000 mg | ORAL_TABLET | Freq: Two times a day (BID) | ORAL | Status: DC

## 2024-09-11 MED ORDER — MAGNESIUM SULFATE 2 GM/50ML IV SOLN
2.0000 g | Freq: Once | INTRAVENOUS | Status: AC
  Administered 2024-09-11: 2 g via INTRAVENOUS
  Filled 2024-09-11: qty 50

## 2024-09-11 MED ORDER — APIXABAN 5 MG PO TABS
10.0000 mg | ORAL_TABLET | Freq: Two times a day (BID) | ORAL | Status: DC
Start: 2024-09-11 — End: 2024-09-11
  Administered 2024-09-11: 10 mg via ORAL
  Filled 2024-09-11: qty 2

## 2024-09-11 MED ORDER — PREDNISONE 20 MG PO TABS
20.0000 mg | ORAL_TABLET | Freq: Every day | ORAL | 0 refills | Status: AC
  Filled 2024-09-11: qty 5, 5d supply, fill #0

## 2024-09-11 MED ORDER — AZITHROMYCIN 500 MG PO TABS
500.0000 mg | ORAL_TABLET | Freq: Every day | ORAL | 0 refills | Status: AC
  Filled 2024-09-11: qty 5, 5d supply, fill #0

## 2024-09-11 MED ORDER — APIXABAN (ELIQUIS) VTE STARTER PACK (10MG AND 5MG)
ORAL_TABLET | ORAL | 0 refills | Status: AC
  Filled 2024-09-11: qty 74, 30d supply, fill #0

## 2024-09-11 NOTE — Progress Notes (Signed)
 Lab called regarding morning collection that was completed by phlebotomist last night around midnight. Per lab it was not received. RN asked for morning labs to be collected with heparin  at 0730 this morning.

## 2024-09-11 NOTE — Assessment & Plan Note (Addendum)
 Old records personally reviewed, with serum cr at 1,23 March 2023 and was up to 1,25 January 2023 and 1,6 in March 2024.  Today renal function with serum cr at 1,36 with K at 3.6 and serum bicarbonate at 21  Na 138. I suspect serum cr is at baseline Plan to follow up as outpatient in 7 days.

## 2024-09-11 NOTE — Discharge Instructions (Signed)
 Information on my medicine - ELIQUIS  (apixaban )   Why was Eliquis  prescribed for you? Eliquis  was prescribed to treat blood clots that may have been found in the veins of your legs (deep vein thrombosis) or in your lungs (pulmonary embolism) and to reduce the risk of them occurring again.  What do You need to know about Eliquis  ? The starting dose is 10 mg (two 5 mg tablets) taken TWICE daily for the FIRST SEVEN (7) DAYS, then on 09/18/24  the dose is reduced to ONE 5 mg tablet taken TWICE daily.  Eliquis  may be taken with or without food.   Try to take the dose about the same time in the morning and in the evening. If you have difficulty swallowing the tablet whole please discuss with your pharmacist how to take the medication safely.  Take Eliquis  exactly as prescribed and DO NOT stop taking Eliquis  without talking to the doctor who prescribed the medication.  Stopping may increase your risk of developing a new blood clot.  Refill your prescription before you run out.  After discharge, you should have regular check-up appointments with your healthcare provider that is prescribing your Eliquis .    What do you do if you miss a dose? If a dose of ELIQUIS  is not taken at the scheduled time, take it as soon as possible on the same day and twice-daily administration should be resumed. The dose should not be doubled to make up for a missed dose.  Important Safety Information A possible side effect of Eliquis  is bleeding. You should call your healthcare provider right away if you experience any of the following: Bleeding from an injury or your nose that does not stop. Unusual colored urine (red or dark brown) or unusual colored stools (red or black). Unusual bruising for unknown reasons. A serious fall or if you hit your head (even if there is no bleeding).  Some medicines may interact with Eliquis  and might increase your risk of bleeding or clotting while on Eliquis . To help avoid  this, consult your healthcare provider or pharmacist prior to using any new prescription or non-prescription medications, including herbals, vitamins, non-steroidal anti-inflammatory drugs (NSAIDs) and supplements.  This website has more information on Eliquis  (apixaban ): [email or web address] .[email or web address] dena

## 2024-09-11 NOTE — Discharge Summary (Signed)
 Physician Discharge Summary   Patient: Elaine [facility] MRN: [id number] DOB: 03-28-1946  Admit date:     09/09/2024  Discharge date: 09/11/24  Discharge Physician: Elidia Sieving Deovion Batrez   PCP: Patient, No Pcp Per   Recommendations at discharge:    Patient completed 3 doses of Remdesivir  during her hospitalization, she will continue taking prednisone  20 mg and azithromycin  500 mg for 5 days.  Continue bronchodilator therapy, inhaled corticosteroids and airway clearing techniques. Placed on anticoagulation with apixaban , will need 3 to 6 mo anticoagulation for provoked pulmonary embolism.  Follow up right lower lobe infiltrate as outpatient (non contrast CT chest)  Follow up renal function and electrolytes in 7 to 10 days.  Follow up with primary care in 7 to 10 days.   Discharge Diagnoses: Principal Problem:   COPD with acute exacerbation (HCC) Active Problems:   COVID-19 virus infection   HTN (hypertension)   Bipolar disorder (HCC)   Invasive lobular carcinoma of left breast, stage 1 (HCC)  Resolved Problems:   * No resolved [facility] problems. [facility] Course: Elaine [facility] was admitted to the [facility] with the working diagnosis of SARS COVID 19 viral pneumonia, complicated with heart failure decompensation.   78 yo female with the past medical history of Parkinson's disease, bipolar disorder, COPD, CKD, hypertension, hyperlipidemia, breast cancer and chronic pancreatitis who presented with dyspnea.  Reported worsening dyspnea for the last 2 to 3 weeks prior to admission. 2 days before admission she had teeth extraction and since then she had poor oral intake and lethargy.  On her initial physical examination her blood pressure was 137/86, HR 88, RR 19 and 02 saturation 99% Lungs with no wheezing or rhonchi, heart with S1 and S2 present and regular, abdomen with no distention and no lower extremity edema.  Na 138 K 4.1 Cl 014 bicarbonate 21, glucose 96 bun 21 cr 1.33 AST 44 ALT  47  BNP 2169  High sensitive troponin 74 and 61  Wbc 5.8 hgb 8,2 plt 297  D dimer 4.61  Influenza negative RSV negative SARS covid 19 positive.   EKG 82 bpm, normal axis, normal intervals, qtc 452, sinus rhythm with no significant ST segment changes, negative T wave lead II, III, aVF, V4 and V5, flat in V6. Positive LVH.    Patient placed on systemic and inhaled steroids, bronchodilators, and airway clearing techniques.  CT chest positive for pulmonary embolism.  09/21 transitioned from IV heparin  to oral apixaban .  Follow up with primary care in 7 to 10 days.   Assessment and Plan: * COPD with acute exacerbation (HCC) Continue bronchodilator therapy, LABA, LAMA and ICS Continue with systemic corticosteroids with prednisone  40 mg po daily to complete 5 days.  Airway clearing techniques with flutter valve and incentive spirometer.  He 02 saturation is 98% on room air today.  Home health services.    COVID-19 virus infection Patient has been placed on Remdesivir , she received 3 doses per protocol with good toleration.  Her CPR is 3.5 today.   Plan to continue supportive medical therapy and will continue with oral prednisone  for 5 more days.  Antitussive agents as needed.  Right lower lobe faint infiltrate, will add antibiotic therapy for 5 days with azithromycin , for possible right lower lobe bacterial over infection, (right lower lobe community acquired pneumonia, present on admission).  Follow up as outpatient.   Acute pulmonary embolism (HCC) CT chest with small segmental and subsegmental pulmonary emboli bilaterally. No evidence of right heart strain.  Bronchiectasis with surrounding air space disease in the right lower lobe. Recommended follow up.  Advanced emphysematous changes.   Echocardiogram with preserved LV systolic function with EF 55 to 60%, grade I diastolic dysfunction, (impaired relaxation)  Right ventricular function preserved, mid increased RV wall thickness.  No significant valvular disease. Normal size LA and RA.   Patient was placed on heparin  drip for anticoagulation with good toleration.  Today transitioned to apixaban , plan to continue anticoagulation for 3 to 6 months, for provoked pulmonary embolism.   HTN (hypertension) Patient not on antihypertensive agents.  Continue blood pressure monitoring.   Chronic kidney disease, stage 3a [facility]) Old records personally reviewed, with serum cr at 1,23 March 2023 and was up to 1,25 January 2023 and 1,6 in March 2024.  Today renal function with serum cr at 1,36 with K at 3.6 and serum bicarbonate at 21  Na 138. I suspect serum cr is at baseline Plan to follow up as outpatient in 7 days.   Bipolar disorder (HCC) Continue with bupropion , and quetiapine .   Invasive lobular carcinoma of left breast, stage 1 (HCC) Follow up as outpatient.       Consultants: none  Procedures performed: none   Disposition: Home Diet recommendation:  Regular diet DISCHARGE MEDICATION: Allergies as of 09/11/2024       Reactions   Influenza Vaccines Anaphylaxis   Influenza Virus Vaccine Other (See Comments)   Muscle spams   Gabapentin  Anxiety   Sulfa Antibiotics Rash   Tetracyclines & Related Rash        Medication List     STOP taking these medications    amoxicillin-clavulanate 600-42.9 MG/5ML suspension Commonly known as: AUGMENTIN   metoprolol succinate 50 MG 24 hr tablet Commonly known as: TOPROL-XL   nitrofurantoin (macrocrystal-monohydrate) 100 MG capsule Commonly known as: MACROBID       TAKE these medications    acetaminophen  325 MG tablet Commonly known as: TYLENOL  Take 2 tablets (650 mg total) by mouth every 6 (six) hours as needed for mild pain (pain score 1-3), headache or fever.   albuterol  (2.5 MG/3ML) 0.083% nebulizer solution Commonly known as: PROVENTIL  Take 3 mLs (2.5 mg total) by nebulization every 6 (six) hours as needed for wheezing or shortness of breath.    ALPRAZolam  0.5 MG tablet Commonly known as: XANAX  Take 0.5 mg by mouth 2 (two) times daily as needed for anxiety or sleep.   Apixaban  Starter Pack (10mg  and 5mg ) Commonly known as: ELIQUIS  STARTER PACK Take as directed on package: start with two-5mg  tablets twice daily for 7 days. On day 8, switch to one-5mg  tablet twice daily.   azithromycin  500 MG tablet Commonly known as: ZITHROMAX  Take 1 tablet (500 mg total) by mouth daily for 5 days.   benzonatate  100 MG capsule Commonly known as: TESSALON  Take 1 capsule (100 mg total) by mouth 3 (three) times daily as needed for cough. What changed: how much to take   bisacodyl 5 MG EC tablet Commonly known as: DULCOLAX Take 5 mg by mouth daily as needed for moderate constipation.   Breo Ellipta  100-25 MCG/INH Aepb Generic drug: fluticasone  furoate-vilanterol Inhale 1 puff into the lungs daily.   buPROPion  150 MG 12 hr tablet Commonly known as: WELLBUTRIN  SR Take 300 mg by mouth daily.   COMBIGAN  OP Place 1 drop into both eyes 2 (two) times daily.   dextromethorphan  15 MG/5ML syrup Take 10 mLs by mouth 4 (four) times daily as needed for cough.   diphenhydramine-acetaminophen   25-500 MG Tabs tablet Commonly known as: TYLENOL  PM Take 1 tablet by mouth at bedtime as needed (for pain,sleep).   esomeprazole 40 MG capsule Commonly known as: NEXIUM Take 40 mg by mouth daily.   ferrous gluconate 324 MG tablet Commonly known as: FERGON Take 324 mg by mouth daily with breakfast.   ipratropium 0.06 % nasal spray Commonly known as: ATROVENT  Place 2 sprays into both nostrils 4 (four) times daily as needed for rhinitis.   Linzess  145 MCG Caps capsule Generic drug: linaclotide  Take 145 mcg by mouth daily before breakfast.   Lumigan  0.01 % Soln Generic drug: bimatoprost  Place 1 drop into both eyes at bedtime.   nitrofurantoin 100 MG capsule Commonly known as: MACRODANTIN Take 100 mg by mouth daily.   ondansetron  4 MG  tablet Commonly known as: ZOFRAN  Take 4 mg by mouth every 6 (six) hours as needed for nausea/vomiting.   predniSONE  20 MG tablet Commonly known as: DELTASONE  Take 1 tablet (20 mg total) by mouth daily at 12 noon.   QUEtiapine  50 MG tablet Commonly known as: SEROQUEL  Take 50 mg by mouth at bedtime.   rosuvastatin  10 MG tablet Commonly known as: CRESTOR  Take 10 mg by mouth at bedtime.   Spiriva  Respimat 2.5 MCG/ACT Aers Generic drug: Tiotropium Bromide  Monohydrate Inhale 2 puffs into the lungs daily.        Discharge Exam: Filed Weights   09/09/24 2034 09/10/24 0656 09/11/24 0340  Weight: 41.5 kg 41.3 kg 39.7 kg   BP 124/63 (BP Location: Left Arm)   Pulse 78   Temp (!) 97.4 F (36.3 C) (Oral)   Resp 18   Ht 5' 5 (1.651 m)   Wt 39.7 kg   SpO2 98%   BMI 14.56 kg/m   Patient is feeling better, no chest pain and no dyspnea, intermittent cough, tolerating po well, no nausea or vomiting.   Neurology awake and alert ENT with mild pallor Cardiovascular with S1 and S2 present and regular with no gallops, rubs or murmurs Respiratory with no rales or wheezing, no rhonchi  Abdomen with no distention  No lower extremity edema.   Condition at discharge: stable  The results of significant diagnostics from this hospitalization (including imaging, microbiology, ancillary and laboratory) are listed below for reference.   Imaging Studies: CT Angio Chest Pulmonary Embolism (PE) W or WO Contrast Addendum Date: 09/10/2024 ADDENDUM REPORT: 09/10/2024 19:03 ADDENDUM: Critical Value/emergent results were called by telephone at the time of interpretation on 09/10/2024 at 7:03 pm to provider Joselynn Amoroso , who verbally acknowledged these results. Electronically Signed   By: Leita Birmingham M.D.   On: 09/10/2024 19:03   Result Date: 09/10/2024 CLINICAL DATA:  Pulmonary embolism suspected, high probability. EXAM: CT ANGIOGRAPHY CHEST WITH CONTRAST TECHNIQUE: Multidetector CT imaging of the  chest was performed using the standard protocol during bolus administration of intravenous contrast. Multiplanar CT image reconstructions and MIPs were obtained to evaluate the vascular anatomy. RADIATION DOSE REDUCTION: This exam was performed according to the departmental dose-optimization program which includes automated exposure control, adjustment of the mA and/or kV according to patient size and/or use of iterative reconstruction technique. CONTRAST:  75mL OMNIPAQUE  IOHEXOL  350 MG/ML SOLN COMPARISON:  None Available. FINDINGS: Cardiovascular: The heart is normal in size and there is a small pericardial effusion. Multi-vessel coronary artery calcifications are seen. There is atherosclerotic calcification of the aorta without evidence of aneurysm. The pulmonary trunk is normal in caliber. Small segmental and subsegmental pulmonary emboli are present  in the lingular segment of the left upper lobe and right lower lobe. No evidence of right heart strain. Mediastinum/Nodes: No mediastinal, hilar, or axillary lymphadenopathy. The thyroid gland, trachea, and esophagus are within normal limits. Lungs/Pleura: Advanced emphysematous changes are present in the lungs. Multifocal bronchiectasis with scattered airspace opacities are noted in the right lower lobe. No pneumothorax is seen. There is a 3 mm nodule in the right lower lobe, axial image 84. Upper Abdomen: No acute abnormality. Musculoskeletal: Degenerative changes are present in the thoracic spine. No acute osseous abnormality is seen. Review of the MIP images confirms the above findings. IMPRESSION: 1. Small segmental and subsegmental pulmonary emboli bilaterally. No evidence of right heart strain. 2. Bronchiectasis with surrounding airspace disease in the right lower lobe, concerning for infection. Follow-up is recommended to exclude the possibility of underlying neoplasm. 3. Advanced emphysematous changes. 4. Coronary artery calcifications. 5. Aortic  atherosclerosis. Electronically Signed: By: Leita Birmingham M.D. On: 09/10/2024 18:25   DG Chest 1 View Result Date: 09/10/2024 CLINICAL DATA:  [id number] Follow-up exam [id number]. EXAM: CHEST  1 VIEW COMPARISON:  09/09/2024. FINDINGS: Bilateral lungs appear hyperlucent with coarse bronchovascular markings, in keeping with COPD. Redemonstration of heterogeneous opacity overlying the right mid lung zone without interval change since the prior study from yesterday. Bilateral lungs otherwise appear clear. No dense consolidation or lung collapse. Bilateral costophrenic angles are clear. Normal cardio-mediastinal silhouette. No acute osseous abnormalities. The soft tissues are within normal limits. IMPRESSION: 1. Redemonstration of heterogeneous opacity overlying the right mid lung zone without interval change since the prior study from yesterday. 2. COPD. Electronically Signed   By: Ree Molt M.D.   On: 09/10/2024 16:46   ECHOCARDIOGRAM COMPLETE Result Date: 09/10/2024    ECHOCARDIOGRAM REPORT   Patient Name:   JHERI MITTER Date of Exam: 09/10/2024 Medical Rec #:  [id number]       Height:       65.0 in Accession #:    [id number]      Weight:       91.0 lb Date of Birth:  August 18, 1946        BSA:          1.416 m Patient Age:    78 years        BP:           149/67 mmHg Patient Gender: F               HR:           79 bpm. Exam Location:  Inpatient Procedure: 2D Echo (Both Spectral and Color Flow Doppler were utilized during            procedure). Indications:    CHF  History:        Patient has no prior history of Echocardiogram examinations.  Sonographer:    Charmaine Gaskins Referring Phys: [id number] RATHORE  Sonographer Comments: Technically challenging study due to limited acoustic windows. Image acquisition challenging due to uncooperative patient. IMPRESSIONS  1. Left ventricular ejection fraction, by estimation, is 55 to 60%. The left ventricle has normal function. The left ventricle has no regional wall  motion abnormalities. Left ventricular diastolic parameters are consistent with Grade I diastolic dysfunction (impaired relaxation).  2. Right ventricular systolic function is normal. The right ventricular size is normal. Mildly increased right ventricular wall thickness. Tricuspid regurgitation signal is inadequate for assessing PA pressure.  3. The mitral valve is normal in structure. No evidence of mitral valve  regurgitation.  4. The aortic valve is tricuspid. Aortic valve regurgitation is not visualized. No aortic stenosis is present.  5. The inferior vena cava is normal in size with greater than 50% respiratory variability, suggesting right atrial pressure of 3 mmHg. FINDINGS  Left Ventricle: Left ventricular ejection fraction, by estimation, is 55 to 60%. The left ventricle has normal function. The left ventricle has no regional wall motion abnormalities. The left ventricular internal cavity size was normal in size. There is  no left ventricular hypertrophy. Left ventricular diastolic parameters are consistent with Grade I diastolic dysfunction (impaired relaxation). Normal left ventricular filling pressure. Right Ventricle: The right ventricular size is normal. Mildly increased right ventricular wall thickness. Right ventricular systolic function is normal. Tricuspid regurgitation signal is inadequate for assessing PA pressure. Left Atrium: Left atrial size was normal in size. Right Atrium: Right atrial size was normal in size. Pericardium: There is no evidence of pericardial effusion. Mitral Valve: The mitral valve is normal in structure. No evidence of mitral valve regurgitation. Tricuspid Valve: The tricuspid valve is normal in structure. Tricuspid valve regurgitation is not demonstrated. Aortic Valve: The aortic valve is tricuspid. Aortic valve regurgitation is not visualized. No aortic stenosis is present. Pulmonic Valve: The pulmonic valve was grossly normal. Pulmonic valve regurgitation is not  visualized. No evidence of pulmonic stenosis. Aorta: The aortic root and ascending aorta are structurally normal, with no evidence of dilitation. Venous: The inferior vena cava is normal in size with greater than 50% respiratory variability, suggesting right atrial pressure of 3 mmHg. IAS/Shunts: The interatrial septum was not well visualized.  LEFT VENTRICLE PLAX 2D LVIDd:         3.70 cm   Diastology LVIDs:         1.80 cm   LV e' medial:    7.29 cm/s LV PW:         0.70 cm   LV E/e' medial:  7.5 LV IVS:        0.70 cm   LV e' lateral:   10.00 cm/s LVOT diam:     1.90 cm   LV E/e' lateral: 5.5 LVOT Area:     2.84 cm  RIGHT VENTRICLE RV Basal diam:  2.10 cm RV Mid diam:    2.00 cm RV S prime:     15.00 cm/s RIGHT ATRIUM          Index RA Area:     8.26 cm RA Volume:   13.90 ml 9.82 ml/m   AORTA Ao Asc diam: 2.60 cm MITRAL VALVE MV Area (PHT): 4.04 cm    SHUNTS MV Decel Time: 188 msec    Systemic Diam: 1.90 cm MV E velocity: 55.00 cm/s MV A velocity: 85.90 cm/s MV E/A ratio:  0.64 Mihai Croitoru MD Electronically signed by Jerel Balding MD Signature Date/Time: 09/10/2024/2:10:54 PM    Final    DG Chest 2 View Result Date: 09/09/2024 EXAM: 2 VIEW(S) XRAY OF THE CHEST 09/09/2024 11:38:00 AM COMPARISON: 11/21/2015 CLINICAL HISTORY: Shortness of breath and new cough x 1 week, history of COPD. Per husband, patient has been lethargic since multiple teeth extraction 2 days ago. No fever. FINDINGS: LUNGS AND PLEURA: Bilateral hyperaerated lungs. Patchy opacity in the right mid lung may represent developing infection. Dense, nodular opacity along the left lower lung on the frontal view without definite correlate on the lateral may represent dystrophic calcification in the setting of prior mastectomy. No pleural effusion. No pneumothorax. HEART AND MEDIASTINUM: Aortic calcifications. No  acute abnormality of the cardiac and mediastinal silhouettes. BONES AND SOFT TISSUES: No acute osseous abnormality. IMPRESSION: 1.  Patchy right mid-lung opacity, which may represent developing infection. Follow-up imaging is recommended to ensure resolution. 2. Dense nodular opacity along the left lower lung on the frontal view without definite lateral correlate, which may represent dystrophic calcification in the setting of prior mastectomy. Electronically signed by: Ryan Chess MD 09/09/2024 12:23 PM EDT RP Workstation: [id number]    Microbiology: Results for orders placed or performed during the [facility] encounter of 09/09/24  Resp panel by RT-PCR (RSV, Flu A&B, Covid) Anterior Nasal Swab     Status: Abnormal   Collection Time: 09/09/24 10:18 AM   Specimen: Anterior Nasal Swab  Result Value Ref Range Status   SARS Coronavirus 2 by RT PCR POSITIVE (A) NEGATIVE Final    Comment: (NOTE) SARS-CoV-2 target nucleic acids are DETECTED.  The SARS-CoV-2 RNA is generally detectable in upper respiratory specimens during the acute phase of infection. Positive results are indicative of the presence of the identified virus, but do not rule out bacterial infection or co-infection with other pathogens not detected by the test. Clinical correlation with patient history and other diagnostic information is necessary to determine patient infection status. The expected result is Negative.  Fact Sheet for Patients: BloggerCourse.com  Fact Sheet for Healthcare Providers: SeriousBroker.it  This test is not yet approved or cleared by the [place]  FDA and  has been authorized for detection and/or diagnosis of SARS-CoV-2 by FDA under an Emergency Use Authorization (EUA).  This EUA will remain in effect (meaning this test can be used) for the duration of  the COVID-19 declaration under Section 564(b)(1) of the A ct, 21 U.S.C. section 360bbb-3(b)(1), unless the authorization is terminated or revoked sooner.     Influenza A by PCR NEGATIVE NEGATIVE Final   Influenza B by PCR  NEGATIVE NEGATIVE Final    Comment: (NOTE) The Xpert Xpress SARS-CoV-2/FLU/RSV plus assay is intended as an aid in the diagnosis of influenza from Nasopharyngeal swab specimens and should not be used as a sole basis for treatment. Nasal washings and aspirates are unacceptable for Xpert Xpress SARS-CoV-2/FLU/RSV testing.  Fact Sheet for Patients: BloggerCourse.com  Fact Sheet for Healthcare Providers: SeriousBroker.it  This test is not yet approved or cleared by the [place]  FDA and has been authorized for detection and/or diagnosis of SARS-CoV-2 by FDA under an Emergency Use Authorization (EUA). This EUA will remain in effect (meaning this test can be used) for the duration of the COVID-19 declaration under Section 564(b)(1) of the Act, 21 U.S.C. section 360bbb-3(b)(1), unless the authorization is terminated or revoked.     Resp Syncytial Virus by PCR NEGATIVE NEGATIVE Final    Comment: (NOTE) Fact Sheet for Patients: BloggerCourse.com  Fact Sheet for Healthcare Providers: SeriousBroker.it  This test is not yet approved or cleared by the [place]  FDA and has been authorized for detection and/or diagnosis of SARS-CoV-2 by FDA under an Emergency Use Authorization (EUA). This EUA will remain in effect (meaning this test can be used) for the duration of the COVID-19 declaration under Section 564(b)(1) of the Act, 21 U.S.C. section 360bbb-3(b)(1), unless the authorization is terminated or revoked.  Performed at [facility], [address] [place], [place] [address]     Labs: CBC: Recent Labs  Lab 09/09/24 1038 09/10/24 0244  WBC 5.8 4.4  HGB 8.2* 7.5*  HCT 27.5* 25.3*  MCV 76.6* 76.0*  PLT 297 294  Basic Metabolic Panel: Recent Labs  Lab 09/09/24 1038 09/10/24 0244 09/11/24 0755  NA 138 138 136  K 4.1 3.6 4.4  CL 104 105 109  CO2 21* 21*  16*  GLUCOSE 96 116* 119*  BUN 21 18 26*  CREATININE 1.33* 1.36* 1.39*  CALCIUM  9.1 8.3* 8.6*  MG  --   --  1.9   Liver Function Tests: Recent Labs  Lab 09/09/24 1038 09/10/24 0244  AST 44* 28  ALT 47* 32  ALKPHOS 72 60  BILITOT 0.4 0.5  PROT 7.3 6.1*  ALBUMIN  3.8 2.6*   CBG: No results for input(s): GLUCAP in the last 168 hours.  Discharge time spent: greater than 30 minutes.  Signed: Elidia Toribio Furnace, MD [facility] 09/11/2024

## 2024-09-11 NOTE — Plan of Care (Signed)

## 2024-09-11 NOTE — Assessment & Plan Note (Signed)
 CT chest with small segmental and subsegmental pulmonary emboli bilaterally. No evidence of right heart strain.  Bronchiectasis with surrounding air space disease in the right lower lobe. Recommended follow up.  Advanced emphysematous changes.   Echocardiogram with preserved LV systolic function with EF 55 to 60%, grade I diastolic dysfunction, (impaired relaxation)  Right ventricular function preserved, mid increased RV wall thickness. No significant valvular disease. Normal size LA and RA.   Patient was placed on heparin  drip for anticoagulation with good toleration.  Today transitioned to apixaban , plan to continue anticoagulation for 3 to 6 months, for provoked pulmonary embolism.

## 2024-09-11 NOTE — Evaluation (Signed)
 Physical Therapy Brief Evaluation and Discharge Note Patient Details Name: Elaine Clayton MRN: [id number] DOB: August 14, 1946 Today's Date: 09/11/2024   History of Present Illness  Pt is a 78 y.o. female admitted 9/19 for SOB. Covid positive, Chest x-ray concerning for pneumonia. PMH: COPD, UTIs, pancreatitis, breast cancer, osteoarthritis, parkinson's disease, bipolar, CKD, HTN, HLD, hyperthyroidism  Clinical Impression  Pt and spouse eager for pt to return home. Spouse reports they have hired caregivers for pt that come in 5 days a week. Pt had HHPT that is currently paused and spouse reports they know who to call when they are ready to restart that. Currently pt requires assist for mobility and with limited activity tolerance.        PT Assessment All further PT needs can be met in the next venue of care (Pt being dc'd home today)  Assistance Needed at Discharge  Frequent or constant Supervision/Assistance    Equipment Recommendations None recommended by PT  Recommendations for Other Services       Precautions/Restrictions Precautions Precautions: Fall Recall of Precautions/Restrictions: Intact Restrictions Weight Bearing Restrictions Per Provider Order: No        Mobility  Bed Mobility       General bed mobility comments: Pt up on BSC  Transfers Overall transfer level: Needs assistance Equipment used: Rollator (4 wheels), None Transfers: Sit to/from Stand, Bed to chair/wheelchair/BSC Sit to Stand: Contact guard assist, Min assist   Step pivot transfers: Contact guard assist       General transfer comment: Assist for balance. Pt with posterior bias on rising.    Ambulation/Gait Ambulation/Gait assistance: Min assist Gait Distance (Feet): 15 Feet Assistive device: Rollator (4 wheels) Gait Pattern/deviations: Step-through pattern, Decreased step length - right, Decreased step length - left, Trunk flexed Gait Speed: Below normal General Gait Details: Assist for  balance. Pt fatigues quickly and sat on rollator and rolled back to bed.  Home Activity Instructions    Stairs            Modified Rankin (Stroke Patients Only)        Balance Overall balance assessment: Needs assistance Sitting-balance support: No upper extremity supported, Feet supported Sitting balance-Leahy Scale: Fair     Standing balance support: Bilateral upper extremity supported, During functional activity Standing balance-Leahy Scale: Poor Standing balance comment: Rollator and CGA for static standing. Min assist for dynamic activitites.          Pertinent Vitals/Pain PT - Brief Vital Signs All Vital Signs Stable: Yes Pain Assessment Pain Assessment: No/denies pain     Home Living Family/patient expects to be discharged to:: Private residence Living Arrangements: Spouse/significant other Available Help at Discharge: Personal care attendant;Family Home Environment: Level entry   Home Equipment: Cane - single point;Wheelchair - manual;Shower seat;Grab bars - tub/shower;Grab bars - toilet;Rollator (4 wheels)        Prior Function Level of Independence: Independent with assistive device(s) Comments: Using rollator    UE/LE Assessment   UE ROM/Strength/Tone/Coordination:  (defer to OT)    LE ROM/Strength/Tone/Coordination: Generalized weakness      Communication   Communication Communication: No apparent difficulties     Cognition Overall Cognitive Status: Impaired Comments: Poor problem solving. Decr awareness of deficits.     General Comments General comments (skin integrity, edema, etc.): VSS on RA    Exercises     Assessment/Plan    PT Problem List Decreased strength;Decreased mobility;Decreased safety awareness;Decreased cognition;Decreased activity tolerance;Decreased balance       PT Visit  Diagnosis Unsteadiness on feet (R26.81);Muscle weakness (generalized) (M62.81);Other abnormalities of gait and mobility (R26.89)    No  Skilled PT     Co-evaluation                AMPAC 6 Clicks Help needed turning from your back to your side while in a flat bed without using bedrails?: None Help needed moving from lying on your back to sitting on the side of a flat bed without using bedrails?: None Help needed moving to and from a bed to a chair (including a wheelchair)?: A Little Help needed standing up from a chair using your arms (e.g., wheelchair or bedside chair)?: A Little Help needed to walk in hospital room?: A Little Help needed climbing 3-5 steps with a railing? : A Lot 6 Click Score: 19      End of Session   Activity Tolerance: Patient limited by fatigue Patient left: in bed;with bed alarm set;with family/visitor present (Pt sitting EOB) Nurse Communication: Mobility status PT Visit Diagnosis: Unsteadiness on feet (R26.81);Muscle weakness (generalized) (M62.81);Other abnormalities of gait and mobility (R26.89)     Time: 1202-1229 PT Time Calculation (min) (ACUTE ONLY): 27 min  Charges:   PT Evaluation $PT Eval Moderate Complexity: 1 Mod PT Treatments $Gait Training: 8-22 mins    [facility] PT Acute Rehabilitation Services Office [phone]   Rodgers ORN [facility]  09/11/2024, 1:26 PM

## 2024-09-11 NOTE — Progress Notes (Signed)
 PHARMACY - ANTICOAGULATION CONSULT NOTE  Pharmacy Consult for Heparin  Indication: possible PE  Allergies  Allergen Reactions   Influenza Vaccines Anaphylaxis   Influenza Virus Vaccine Other (See Comments)    Muscle spams   Gabapentin  Anxiety   Sulfa Antibiotics Rash   Tetracyclines & Related Rash    Patient Measurements: Height: 5' 5 (165.1 cm) Weight: 41.3 kg (91 lb 0.8 oz) IBW/kg (Calculated) : 57 HEPARIN  DW (KG): 41.5  Vital Signs: Temp: 97.8 F (36.6 C) (09/20 2306) Temp Source: Oral (09/20 2306) BP: 126/61 (09/20 2306) Pulse Rate: 87 (09/20 2013)  Labs: Recent Labs    09/09/24 1038 09/10/24 0244 09/10/24 1300 09/10/24 2305  HGB 8.2* 7.5*  --   --   HCT 27.5* 25.3*  --   --   PLT 297 294  --   --   HEPARINUNFRC  --   --  0.67 0.46  CREATININE 1.33* 1.36*  --   --     Estimated Creatinine Clearance: 22.9 mL/min (A) (by C-G formula based on SCr of 1.36 mg/dL (H)).   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  ER: 90%; PR: Neg, Her 2 neu not overexpressed.    Broken foot 2015   right foot   Chronic gastritis    Chronic kidney disease    Dr Marcelino   COPD (chronic obstructive pulmonary disease) (HCC)    Dizziness    inner ear (per pt) several times per week   Dysphagia    Falls frequently    Glaucoma    Hyperlipemia    Hypertension    pt has recently come off BP meds.  MD aware. BP seems stable.   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    Memory difficulty    Neck stiffness    s/p C3-C4 fusion, limited right turn   Osteoarthritis     back   Pancreatitis    Parkinson disease (HCC)    Pituitary microadenoma (HCC)    Recurrent UTI    Renal insufficiency    Stomach ulcer 2112    Medications:  Medications Prior to Admission  Medication Sig Dispense Refill Last Dose/Taking   albuterol  (PROVENTIL ) (2.5 MG/3ML) 0.083% nebulizer solution Take 3 mLs (2.5 mg total) by nebulization every 6 (six) hours as needed for wheezing or shortness of breath. 360 mL 6 Past Week   ALPRAZolam  (XANAX ) 0.5 MG tablet Take 0.5 mg by mouth 2 (two) times daily as needed for anxiety or sleep.   09/08/2024 Bedtime   amoxicillin-clavulanate (AUGMENTIN) 600-42.9 MG/5ML suspension Take 5 mLs by mouth 3 (three) times daily.   Past Week   benzonatate  (TESSALON ) 100 MG capsule Take 1 capsule (100 mg total) by mouth 3 (three) times daily as needed for cough. (Patient taking differently: Take 200 mg by mouth 3 (three) times daily as needed for cough.) 90 capsule 0 Past Week   bisacodyl (DULCOLAX) 5 MG EC tablet Take 5 mg by mouth daily as needed for moderate constipation.   Past Week   Brimonidine  Tartrate-Timolol  (COMBIGAN  OP) Place 1 drop into  both eyes 2 (two) times daily.   09/08/2024 Morning   buPROPion  (WELLBUTRIN  SR) 150 MG 12 hr tablet Take 300 mg by mouth daily.    09/08/2024 Morning   dextromethorphan  15 MG/5ML syrup Take 10 mLs by mouth 4 (four) times daily as needed for cough.   Past Week   diphenhydramine-acetaminophen  (TYLENOL  PM) 25-500 MG TABS tablet Take 1 tablet by mouth at bedtime as needed (for pain,sleep).   Past Week   esomeprazole (NEXIUM) 40 MG capsule Take 40 mg by mouth daily.   09/08/2024 Morning   ferrous gluconate (FERGON) 324 MG tablet Take 324 mg by mouth daily with breakfast.   09/08/2024 Morning   fluticasone  furoate-vilanterol (BREO ELLIPTA ) 100-25 MCG/INH AEPB Inhale 1 puff into the lungs daily. 1 each 5 09/08/2024 Morning   ipratropium (ATROVENT ) 0.06 % nasal spray Place 2 sprays into both nostrils 4 (four) times daily as needed for  rhinitis.   Past Week   linaclotide  (LINZESS ) 145 MCG CAPS capsule Take 145 mcg by mouth daily before breakfast.   09/08/2024 Morning   LUMIGAN  0.01 % SOLN Place 1 drop into both eyes at bedtime.   0 09/08/2024 Bedtime   nitrofurantoin (MACRODANTIN) 100 MG capsule Take 100 mg by mouth daily.   09/08/2024 Morning   nitrofurantoin, macrocrystal-monohydrate, (MACROBID) 100 MG capsule Take 100 mg by mouth daily.   09/08/2024 Morning   ondansetron  (ZOFRAN ) 4 MG tablet Take 4 mg by mouth every 6 (six) hours as needed for nausea/vomiting.   09/08/2024 Morning   QUEtiapine  (SEROQUEL ) 50 MG tablet Take 50 mg by mouth at bedtime.   09/08/2024 Bedtime   rosuvastatin  (CRESTOR ) 10 MG tablet Take 10 mg by mouth at bedtime.    09/08/2024 Bedtime   Tiotropium Bromide  Monohydrate (SPIRIVA  RESPIMAT) 2.5 MCG/ACT AERS Inhale 2 puffs into the lungs daily. 4 g 0 09/08/2024 Morning   metoprolol succinate (TOPROL-XL) 50 MG 24 hr tablet Take 12.5 mg by mouth daily.      Tiotropium Bromide  Monohydrate (SPIRIVA  RESPIMAT) 2.5 MCG/ACT AERS Inhale 2 Inhalers into the lungs daily. 4 g 6     Assessment: 78 y.o. female with SOB and elevated D-Dimer, possible PE, for heparin .  Received Lovenox  30 mg at 11 pm  Hgb 8.2 > 7.5, pltc 294 stable.  No overt s/sx bleeding per RN, but she did remove a PIV earlier due to oozing.  Heparin  level is therapeutic tonight at 0.46 on 650 un/hr No issues with infusion or bleeding reported  Goal of Therapy:  Heparin  level 0.3-0.7 units/ml Monitor platelets by anticoagulation protocol: Yes   Plan:  Continue heparin  at 650 units//hr 8h heparin  level F/U VQ scan vs CTA Monitor s/sx bleeding closely    Thank you for allowing pharmacy to be a part of this patient's care.   Bascom JAYSON Louder, PharmD 09/11/2024 12:48 AM  **Pharmacist phone directory can be found on [email or web address] listed under [facility]**

## 2024-11-29 ENCOUNTER — Other Ambulatory Visit (HOSPITAL_COMMUNITY): Payer: Self-pay
